# Patient Record
Sex: Female | Born: 1935
Health system: Southern US, Community
[De-identification: ages and names within clinical notes are randomized; demographics above are authoritative.]

## PROBLEM LIST (undated history)

## (undated) DIAGNOSIS — I639 Cerebral infarction, unspecified: Secondary | ICD-10-CM

## (undated) DIAGNOSIS — Q2112 Patent foramen ovale: Secondary | ICD-10-CM

## (undated) DIAGNOSIS — I10 Essential (primary) hypertension: Secondary | ICD-10-CM

## (undated) DIAGNOSIS — Q211 Atrial septal defect: Secondary | ICD-10-CM

## (undated) DIAGNOSIS — Z7901 Long term (current) use of anticoagulants: Secondary | ICD-10-CM

## (undated) DIAGNOSIS — F329 Major depressive disorder, single episode, unspecified: Secondary | ICD-10-CM

## (undated) DIAGNOSIS — I480 Paroxysmal atrial fibrillation: Secondary | ICD-10-CM

## (undated) DIAGNOSIS — E785 Hyperlipidemia, unspecified: Secondary | ICD-10-CM

## (undated) DIAGNOSIS — F32A Depression, unspecified: Secondary | ICD-10-CM

## (undated) HISTORY — DX: Depression, unspecified: F32.A

## (undated) HISTORY — PX: COLONOSCOPY: SHX174

## (undated) HISTORY — DX: Major depressive disorder, single episode, unspecified: F32.9

---

## 1998-09-28 ENCOUNTER — Ambulatory Visit (HOSPITAL_BASED_OUTPATIENT_CLINIC_OR_DEPARTMENT_OTHER): Admission: RE | Admit: 1998-09-28 | Discharge: 1998-09-28 | Payer: Self-pay | Admitting: Orthopedic Surgery

## 2000-07-21 ENCOUNTER — Ambulatory Visit (HOSPITAL_COMMUNITY): Admission: RE | Admit: 2000-07-21 | Discharge: 2000-07-21 | Payer: Self-pay | Admitting: Internal Medicine

## 2000-07-21 ENCOUNTER — Encounter: Payer: Self-pay | Admitting: Internal Medicine

## 2000-11-11 ENCOUNTER — Encounter: Admission: RE | Admit: 2000-11-11 | Discharge: 2001-02-09 | Payer: Self-pay | Admitting: Internal Medicine

## 2000-12-12 ENCOUNTER — Encounter: Payer: Self-pay | Admitting: Internal Medicine

## 2000-12-12 ENCOUNTER — Ambulatory Visit (HOSPITAL_COMMUNITY): Admission: RE | Admit: 2000-12-12 | Discharge: 2000-12-12 | Payer: Self-pay | Admitting: Internal Medicine

## 2001-04-22 ENCOUNTER — Encounter: Payer: Self-pay | Admitting: Internal Medicine

## 2001-04-22 ENCOUNTER — Ambulatory Visit (HOSPITAL_COMMUNITY): Admission: RE | Admit: 2001-04-22 | Discharge: 2001-04-22 | Payer: Self-pay | Admitting: Internal Medicine

## 2001-11-16 ENCOUNTER — Other Ambulatory Visit: Admission: RE | Admit: 2001-11-16 | Discharge: 2001-11-16 | Payer: Self-pay | Admitting: Dermatology

## 2001-12-14 ENCOUNTER — Encounter: Payer: Self-pay | Admitting: Internal Medicine

## 2001-12-14 ENCOUNTER — Ambulatory Visit (HOSPITAL_COMMUNITY): Admission: RE | Admit: 2001-12-14 | Discharge: 2001-12-14 | Payer: Self-pay | Admitting: Internal Medicine

## 2002-04-28 ENCOUNTER — Encounter: Payer: Self-pay | Admitting: Emergency Medicine

## 2002-04-28 ENCOUNTER — Emergency Department (HOSPITAL_COMMUNITY): Admission: EM | Admit: 2002-04-28 | Discharge: 2002-04-28 | Payer: Self-pay | Admitting: Emergency Medicine

## 2002-12-16 ENCOUNTER — Ambulatory Visit (HOSPITAL_COMMUNITY): Admission: RE | Admit: 2002-12-16 | Discharge: 2002-12-16 | Payer: Self-pay | Admitting: Internal Medicine

## 2002-12-16 ENCOUNTER — Encounter: Payer: Self-pay | Admitting: Internal Medicine

## 2003-05-23 ENCOUNTER — Ambulatory Visit (HOSPITAL_COMMUNITY): Admission: RE | Admit: 2003-05-23 | Discharge: 2003-05-23 | Payer: Self-pay | Admitting: Internal Medicine

## 2003-05-28 ENCOUNTER — Emergency Department (HOSPITAL_COMMUNITY): Admission: EM | Admit: 2003-05-28 | Discharge: 2003-05-28 | Payer: Self-pay | Admitting: Emergency Medicine

## 2003-06-03 ENCOUNTER — Ambulatory Visit (HOSPITAL_BASED_OUTPATIENT_CLINIC_OR_DEPARTMENT_OTHER): Admission: RE | Admit: 2003-06-03 | Discharge: 2003-06-03 | Payer: Self-pay | Admitting: Orthopedic Surgery

## 2003-06-03 ENCOUNTER — Encounter: Admission: RE | Admit: 2003-06-03 | Discharge: 2003-06-03 | Payer: Self-pay | Admitting: Orthopedic Surgery

## 2004-01-17 ENCOUNTER — Ambulatory Visit (HOSPITAL_COMMUNITY): Admission: RE | Admit: 2004-01-17 | Discharge: 2004-01-17 | Payer: Self-pay | Admitting: Internal Medicine

## 2005-01-21 ENCOUNTER — Ambulatory Visit (HOSPITAL_COMMUNITY): Admission: RE | Admit: 2005-01-21 | Discharge: 2005-01-21 | Payer: Self-pay | Admitting: Internal Medicine

## 2005-01-31 ENCOUNTER — Ambulatory Visit (HOSPITAL_COMMUNITY): Admission: RE | Admit: 2005-01-31 | Discharge: 2005-01-31 | Payer: Self-pay | Admitting: Internal Medicine

## 2005-02-28 ENCOUNTER — Ambulatory Visit (HOSPITAL_COMMUNITY): Admission: RE | Admit: 2005-02-28 | Discharge: 2005-02-28 | Payer: Self-pay | Admitting: Internal Medicine

## 2005-04-06 ENCOUNTER — Emergency Department (HOSPITAL_COMMUNITY): Admission: EM | Admit: 2005-04-06 | Discharge: 2005-04-06 | Payer: Self-pay | Admitting: Emergency Medicine

## 2005-04-18 ENCOUNTER — Ambulatory Visit (HOSPITAL_COMMUNITY): Admission: RE | Admit: 2005-04-18 | Discharge: 2005-04-18 | Payer: Self-pay | Admitting: Internal Medicine

## 2005-04-29 ENCOUNTER — Ambulatory Visit (HOSPITAL_COMMUNITY): Admission: RE | Admit: 2005-04-29 | Discharge: 2005-04-29 | Payer: Self-pay | Admitting: Internal Medicine

## 2006-02-03 ENCOUNTER — Ambulatory Visit (HOSPITAL_COMMUNITY): Admission: RE | Admit: 2006-02-03 | Discharge: 2006-02-03 | Payer: Self-pay | Admitting: Internal Medicine

## 2007-02-10 ENCOUNTER — Ambulatory Visit (HOSPITAL_COMMUNITY): Admission: RE | Admit: 2007-02-10 | Discharge: 2007-02-10 | Payer: Self-pay | Admitting: Internal Medicine

## 2008-02-12 ENCOUNTER — Ambulatory Visit (HOSPITAL_COMMUNITY): Admission: RE | Admit: 2008-02-12 | Discharge: 2008-02-12 | Payer: Self-pay | Admitting: Internal Medicine

## 2009-02-13 ENCOUNTER — Ambulatory Visit (HOSPITAL_COMMUNITY): Admission: RE | Admit: 2009-02-13 | Discharge: 2009-02-13 | Payer: Self-pay | Admitting: Internal Medicine

## 2009-02-22 ENCOUNTER — Ambulatory Visit (HOSPITAL_COMMUNITY): Admission: RE | Admit: 2009-02-22 | Discharge: 2009-02-22 | Payer: Self-pay | Admitting: Internal Medicine

## 2009-02-27 ENCOUNTER — Ambulatory Visit (HOSPITAL_COMMUNITY): Admission: RE | Admit: 2009-02-27 | Discharge: 2009-02-27 | Payer: Self-pay | Admitting: Internal Medicine

## 2009-03-03 ENCOUNTER — Ambulatory Visit: Payer: Self-pay | Admitting: Cardiology

## 2009-03-03 ENCOUNTER — Encounter (INDEPENDENT_AMBULATORY_CARE_PROVIDER_SITE_OTHER): Payer: Self-pay | Admitting: Internal Medicine

## 2009-10-18 ENCOUNTER — Observation Stay (HOSPITAL_COMMUNITY)
Admission: AD | Admit: 2009-10-18 | Discharge: 2009-10-19 | Payer: Self-pay | Source: Home / Self Care | Admitting: Internal Medicine

## 2010-02-15 ENCOUNTER — Ambulatory Visit (HOSPITAL_COMMUNITY)
Admission: RE | Admit: 2010-02-15 | Discharge: 2010-02-15 | Payer: Self-pay | Source: Home / Self Care | Attending: Internal Medicine | Admitting: Internal Medicine

## 2010-03-17 ENCOUNTER — Encounter: Payer: Self-pay | Admitting: Internal Medicine

## 2010-05-11 LAB — BASIC METABOLIC PANEL
BUN: 21 mg/dL (ref 6–23)
Chloride: 100 mEq/L (ref 96–112)
GFR calc non Af Amer: 49 mL/min — ABNORMAL LOW (ref >60)
Potassium: 4 mEq/L (ref 3.5–5.1)
Sodium: 136 mEq/L (ref 135–145)

## 2010-05-11 LAB — GLUCOSE, CAPILLARY
Glucose-Capillary: 139 mg/dL — ABNORMAL HIGH (ref 70–99)
Glucose-Capillary: 148 mg/dL — ABNORMAL HIGH (ref 70–99)
Glucose-Capillary: 184 mg/dL — ABNORMAL HIGH (ref 70–99)

## 2010-05-11 LAB — DIFFERENTIAL
Eosinophils Relative: 11 % — ABNORMAL HIGH (ref 0–5)
Lymphocytes Relative: 28 % (ref 12–46)
Lymphs Abs: 3 10*3/uL (ref 0.7–4.0)
Monocytes Absolute: 0.8 10*3/uL (ref 0.1–1.0)
Monocytes Relative: 8 % (ref 3–12)
Neutro Abs: 5.7 10*3/uL (ref 1.7–7.7)

## 2010-05-11 LAB — CBC
HCT: 43.1 % (ref 36.0–46.0)
Hemoglobin: 14.3 g/dL (ref 12.0–15.0)
MCV: 87.6 fL (ref 78.0–100.0)
RDW: 13.5 % (ref 11.5–15.5)
WBC: 10.8 10*3/uL — ABNORMAL HIGH (ref 4.0–10.5)

## 2010-07-13 NOTE — Op Note (Signed)
NAME:  Amy Wiggins, Amy Wiggins                       ACCOUNT NO.:  [id number]   MEDICAL RECORD NO.:  [id number]                   PATIENT TYPE:  AMB   LOCATION:  [place]                                  FACILITY:  [facility]   PHYSICIAN:  Youlanda Mighty. Luisa Dago., M.D.          DATE OF BIRTH:  27-May-1931   DATE OF PROCEDURE:  06/03/2003  DATE OF DISCHARGE:                                 OPERATIVE REPORT   PREOPERATIVE DIAGNOSIS:  Chronic liquified hematoma dorsal aspect of the  left hand, status post blunt trauma 2 weeks prior.   POSTOPERATIVE DIAGNOSIS:  Chronic liquified hematoma dorsal aspect of the  left hand, status post blunt trauma 2 weeks prior.   PROCEDURE:  1. Evacuation of hematoma subcutaneous region dorsal aspect left hand.  2. Examination of left hand under anesthesia with gentle joint manipulation.   SURGEON:  Youlanda Mighty. Sypher, M.D.   ASSISTANT:  Marily Lente. Dasnoit, P.A.C.   ANESTHESIA:  General by LMA.   SUPERVISING ANESTHESIOLOGIST:  Dr. Oletta Lamas.   INDICATIONS:  Amy Wiggins is a 77 year old woman who 2 weeks prior  sustained a blunt injury to the dorsal aspect of her left hand. She was only  taking aspirin and was not on any anticoagulants. She developed a massive  hematoma on the dorsal aspect of her left hand that extends from her wrist  extensor creases to her metacarpal phalangeal joints.  This became tense and  was elevated nearly 4 cm above the metacarpal level.  She was seen by Dr.  Willey Blade in [place] and noted to have a substantial hematoma.  He requested  an upper extremity orthopedic consult.   Clinical examination on the morning of June 03, 2003 demonstrated a  partially congealed, partially liquefactive, tense hematoma on the dorsal  aspect of the left hand.  She was unable to flex her MP joints more than 40  degrees and had moderate stiffness of her IP joints.  Given the massive  nature of this hematoma and the stiffness of her joints we recommended  proceeding with evacuation of the hematoma on an urgent basis.   She was NPO and was scheduled for surgery immediately this afternoon.  After  informed consent she was brought to the operating room, at this time.   DESCRIPTION OF PROCEDURE:  Amy Wiggins is brought to the operating  room and placed in the supine position on the operating room table.  Following the induction of general anesthesia the left arm was prepped with  Betadine solution and sterilely draped. A pneumatic tourniquet was applied  to the proximal __________.   The arm was exsanguinated by elevation and manual compression for 1 minute.  The arterial tourniquet was inflated to 220 mmHg.  We proceeded to commence  with a short incision over the apex of the hematoma.  A small metal, blunt  tip vascular sucker was then used, not unlike liposuction, to repetitively  break up the hematoma  and evacuate the congealed blood and liquefactive  hematoma contents. With gentle effort, the entire hematoma was evacuated.   The subcutaneous space was then lavaged thoroughly with sterile saline until  the effluent was completely clear.  The wound was then dressed open with  Adaptic, sterile gauze and a volumus  hand dressing maintaining the MP  joints in maximum flexion.  There was noted to be moderate stiffness of the  MP and IP joints, therefore, gentle manipulation recovered 90 degrees of  motion of the MP joints and 100 degrees of motion of the PIP joints.   The hand was immobilized with the MP joints in 90 degrees flexion and the  wrist in 45 degrees dorsiflexion.  There were no apparent complications.                                               Youlanda Mighty Luisa Dago., M.D.    RVS/MEDQ  D:  06/03/2003  T:  06/04/2003  Job:  [id number]

## 2010-12-12 ENCOUNTER — Inpatient Hospital Stay (HOSPITAL_COMMUNITY)
Admission: EM | Admit: 2010-12-12 | Discharge: 2010-12-14 | DRG: 066 | Disposition: A | Discharge status: Home / Self Care | Payer: Medicare Other | Attending: Internal Medicine | Admitting: Internal Medicine

## 2010-12-12 ENCOUNTER — Emergency Department (HOSPITAL_COMMUNITY): Payer: Medicare Other

## 2010-12-12 ENCOUNTER — Other Ambulatory Visit: Payer: Self-pay

## 2010-12-12 DIAGNOSIS — F329 Major depressive disorder, single episode, unspecified: Secondary | ICD-10-CM | POA: Diagnosis present

## 2010-12-12 DIAGNOSIS — K219 Gastro-esophageal reflux disease without esophagitis: Secondary | ICD-10-CM | POA: Diagnosis present

## 2010-12-12 DIAGNOSIS — I6350 Cerebral infarction due to unspecified occlusion or stenosis of unspecified cerebral artery: Principal | ICD-10-CM | POA: Diagnosis present

## 2010-12-12 DIAGNOSIS — I639 Cerebral infarction, unspecified: Secondary | ICD-10-CM

## 2010-12-12 DIAGNOSIS — R29898 Other symptoms and signs involving the musculoskeletal system: Secondary | ICD-10-CM | POA: Diagnosis present

## 2010-12-12 DIAGNOSIS — E785 Hyperlipidemia, unspecified: Secondary | ICD-10-CM | POA: Diagnosis present

## 2010-12-12 DIAGNOSIS — I10 Essential (primary) hypertension: Secondary | ICD-10-CM | POA: Diagnosis present

## 2010-12-12 DIAGNOSIS — E119 Type 2 diabetes mellitus without complications: Secondary | ICD-10-CM | POA: Diagnosis present

## 2010-12-12 DIAGNOSIS — F3289 Other specified depressive episodes: Secondary | ICD-10-CM | POA: Diagnosis present

## 2010-12-12 HISTORY — DX: Cerebral infarction, unspecified: I63.9

## 2010-12-12 HISTORY — DX: Essential (primary) hypertension: I10

## 2010-12-12 LAB — URINALYSIS, ROUTINE W REFLEX MICROSCOPIC
Bilirubin Urine: NEGATIVE
Glucose, UA: >1000 mg/dL — AB
Ketones, ur: NEGATIVE mg/dL
Leukocytes, UA: NEGATIVE
Protein, ur: NEGATIVE mg/dL
pH: 5.5 (ref 5.0–8.0)

## 2010-12-12 LAB — BASIC METABOLIC PANEL
BUN: 25 mg/dL — ABNORMAL HIGH (ref 6–23)
Chloride: 98 mEq/L (ref 96–112)
Creatinine, Ser: 1.18 mg/dL — ABNORMAL HIGH (ref 0.50–1.10)
GFR calc Af Amer: 51 mL/min — ABNORMAL LOW (ref >90)
GFR calc non Af Amer: 44 mL/min — ABNORMAL LOW (ref >90)
Potassium: 3.8 mEq/L (ref 3.5–5.1)

## 2010-12-12 LAB — CBC
HCT: 35.9 % — ABNORMAL LOW (ref 36.0–46.0)
MCHC: 32.9 g/dL (ref 30.0–36.0)
MCV: 87.8 fL (ref 78.0–100.0)
Platelets: 296 10*3/uL (ref 150–400)
RDW: 12.7 % (ref 11.5–15.5)
WBC: 7.4 10*3/uL (ref 4.0–10.5)

## 2010-12-12 LAB — CARDIAC PANEL(CRET KIN+CKTOT+MB+TROPI)
Relative Index: INVALID (ref 0.0–2.5)
Troponin I: <0.3 ng/mL (ref <0.30)

## 2010-12-12 LAB — GLUCOSE, CAPILLARY: Glucose-Capillary: 136 mg/dL — ABNORMAL HIGH (ref 70–99)

## 2010-12-12 LAB — URINE MICROSCOPIC-ADD ON

## 2010-12-12 LAB — PROTIME-INR: Prothrombin Time: 12 seconds (ref 11.6–15.2)

## 2010-12-12 MED ORDER — PANTOPRAZOLE SODIUM 40 MG PO TBEC
40.0000 mg | DELAYED_RELEASE_TABLET | Freq: Every day | ORAL | Status: DC
  Administered 2010-12-13: 40 mg via ORAL
  Filled 2010-12-12: qty 1

## 2010-12-12 MED ORDER — INSULIN ASPART 100 UNIT/ML ~~LOC~~ SOLN: 0.0000 [IU] | Freq: Every day | SUBCUTANEOUS | Status: DC

## 2010-12-12 MED ORDER — CITALOPRAM HYDROBROMIDE 10 MG PO TABS
10.0000 mg | ORAL_TABLET | Freq: Every day | ORAL | Status: DC
  Administered 2010-12-13: 10 mg via ORAL
  Filled 2010-12-12 (×3): qty 1

## 2010-12-12 MED ORDER — ROSUVASTATIN CALCIUM 5 MG PO TABS
5.0000 mg | ORAL_TABLET | Freq: Every day | ORAL | Status: DC
  Administered 2010-12-13: 5 mg via ORAL
  Filled 2010-12-12 (×3): qty 1

## 2010-12-12 MED ORDER — ONDANSETRON HCL 4 MG/2ML IJ SOLN: 4.0000 mg | Freq: Four times a day (QID) | INTRAMUSCULAR | Status: DC | PRN

## 2010-12-12 MED ORDER — LOSARTAN POTASSIUM 50 MG PO TABS
100.0000 mg | ORAL_TABLET | Freq: Every day | ORAL | Status: DC
  Administered 2010-12-12 – 2010-12-13 (×2): 100 mg via ORAL
  Filled 2010-12-12 (×3): qty 2

## 2010-12-12 MED ORDER — HYDROCHLOROTHIAZIDE 25 MG PO TABS
25.0000 mg | ORAL_TABLET | Freq: Every day | ORAL | Status: DC
Start: 2010-12-12 — End: 2010-12-14
  Administered 2010-12-13: 25 mg via ORAL
  Filled 2010-12-12 (×3): qty 1

## 2010-12-12 MED ORDER — ENOXAPARIN SODIUM 40 MG/0.4ML ~~LOC~~ SOLN
40.0000 mg | Freq: Every day | SUBCUTANEOUS | Status: DC
  Administered 2010-12-13: 40 mg via SUBCUTANEOUS
  Filled 2010-12-12: qty 0.4

## 2010-12-12 MED ORDER — ASPIRIN EC 81 MG PO TBEC
81.0000 mg | DELAYED_RELEASE_TABLET | Freq: Every day | ORAL | Status: DC
  Administered 2010-12-13: 81 mg via ORAL
  Filled 2010-12-12 (×3): qty 1

## 2010-12-12 MED ORDER — METFORMIN HCL 500 MG PO TABS
1000.0000 mg | ORAL_TABLET | Freq: Two times a day (BID) | ORAL | Status: DC
  Administered 2010-12-12 – 2010-12-14 (×4): 1000 mg via ORAL
  Filled 2010-12-12 (×4): qty 2

## 2010-12-12 MED ORDER — ONDANSETRON HCL 4 MG PO TABS: 4.0000 mg | ORAL_TABLET | Freq: Four times a day (QID) | ORAL | Status: DC | PRN

## 2010-12-12 MED ORDER — SODIUM CHLORIDE 0.9 % IJ SOLN
3.0000 mL | Freq: Two times a day (BID) | INTRAMUSCULAR | Status: DC
  Administered 2010-12-12 – 2010-12-13 (×3): 3 mL via INTRAVENOUS
  Filled 2010-12-12 (×2): qty 3

## 2010-12-12 MED ORDER — ACETAMINOPHEN 650 MG RE SUPP: 650.0000 mg | Freq: Four times a day (QID) | RECTAL | Status: DC | PRN

## 2010-12-12 MED ORDER — METFORMIN HCL 500 MG PO TABS: 2000.0000 mg | ORAL_TABLET | Freq: Two times a day (BID) | ORAL | Status: DC

## 2010-12-12 MED ORDER — ACETAMINOPHEN 325 MG PO TABS
650.0000 mg | ORAL_TABLET | Freq: Four times a day (QID) | ORAL | Status: DC | PRN
  Administered 2010-12-12 – 2010-12-13 (×3): 650 mg via ORAL
  Filled 2010-12-12 (×3): qty 2

## 2010-12-12 MED ORDER — ASPIRIN 81 MG PO CHEW
324.0000 mg | CHEWABLE_TABLET | Freq: Once | ORAL | Status: AC
  Administered 2010-12-12: 324 mg via ORAL
  Filled 2010-12-12: qty 4

## 2010-12-12 MED ORDER — INSULIN ASPART 100 UNIT/ML ~~LOC~~ SOLN
0.0000 [IU] | Freq: Three times a day (TID) | SUBCUTANEOUS | Status: DC
  Administered 2010-12-12: 2 [IU] via SUBCUTANEOUS
  Administered 2010-12-13 – 2010-12-14 (×3): 1 [IU] via SUBCUTANEOUS
  Filled 2010-12-12: qty 3

## 2010-12-12 MED ORDER — CLOPIDOGREL BISULFATE 75 MG PO TABS
75.0000 mg | ORAL_TABLET | Freq: Every day | ORAL | Status: DC
  Administered 2010-12-13: 75 mg via ORAL
  Filled 2010-12-12: qty 1

## 2010-12-12 MED ORDER — SODIUM CHLORIDE 0.9 % IV SOLN
Freq: Once | INTRAVENOUS | Status: AC
  Administered 2010-12-12: 10:00:00 via INTRAVENOUS

## 2010-12-12 MED ORDER — SODIUM CHLORIDE 0.9 % IV SOLN: 250.0000 mL | INTRAVENOUS | Status: DC

## 2010-12-12 MED ORDER — ZOLPIDEM TARTRATE 5 MG PO TABS
5.0000 mg | ORAL_TABLET | Freq: Every evening | ORAL | Status: DC | PRN
  Administered 2010-12-12 – 2010-12-13 (×2): 5 mg via ORAL
  Filled 2010-12-12 (×2): qty 1

## 2010-12-12 MED ORDER — ALUM & MAG HYDROXIDE-SIMETH 200-200-20 MG/5ML PO SUSP: 30.0000 mL | Freq: Four times a day (QID) | ORAL | Status: DC | PRN

## 2010-12-12 MED ORDER — SODIUM CHLORIDE 0.9 % IJ SOLN
3.0000 mL | INTRAMUSCULAR | Status: DC | PRN
  Filled 2010-12-12: qty 3

## 2010-12-12 NOTE — ED Notes (Signed)
Pt given diabetic lunch tray

## 2010-12-12 NOTE — Progress Notes (Signed)
Findings of MRI of head received from Radiologist. Dr. Dorris Fetch paged at [phone]. Returned page at [employer]. Dr. Dorris Fetch ordered neurology consult as well as q 4 hour neuro checks. Will continue to monitor. Sharyn Blitz, RN

## 2010-12-12 NOTE — ED Provider Notes (Signed)
Medical screening examination/treatment/procedure(s) were conducted as a shared visit with non-physician practitioner(s) and myself.  I personally evaluated the patient during the encounter  77 year old, female with a history of strokes in the past.  Complains of left arm, and hand weakness, since this morning.  She denies trauma she denies pain anywhere.  She denies visual changes, nausea, vomiting, headache.  On physical examination.  She is in no distress.  Her heart and lungs are normal.  She does have decreased grip strength and extension of her wrist in the left hand compared to the right.  We'll perform a CAT scan to look for a stroke and plan on admitting her for evaluation of TIA versus stroke.  The  Elmer Picker, MD 12/28/10 [phone]

## 2010-12-12 NOTE — ED Notes (Signed)
Pt reports woke up at 0800 this am with weakness in left arm.  Denies any other symptoms.

## 2010-12-12 NOTE — H&P (Signed)
NAME:  Amy Wiggins, Amy Wiggins             ACCOUNT NO.:  [id number]  MEDICAL RECORD NO.:  [id number]  LOCATION:  A316                          FACILITY:  [facility]  PHYSICIAN:  Paula Compton. Willey Blade, MD       DATE OF BIRTH:  02-Nov-1931  DATE OF ADMISSION:  12/12/2010 DATE OF DISCHARGE:  LH                             HISTORY & PHYSICAL   CHIEF COMPLAINT:  Left hand weakness.  HISTORY OF PRESENT ILLNESS:  This patient is a 77 year old white female who presented to the emergency room after awakening with weakness in her left hand this morning.  She denied any weakness in her left leg.  She did not experience facial weakness or slurring of speech.  She denied any visual disturbances.  She has a prior history of strokes.  She has hypertension, diabetes, and hyperlipidemia.  She does not smoke.  She takes Plavix daily.  She has no known history of atrial fib.  She was last hospitalized for a stroke 14 months ago.  She was initially evaluated in the emergency room where CT scan of the brain revealed no evidence of acute stroke.  She was seen by tele, Neurology, and was not deemed to be a candidate for thrombolytic therapy.  PAST MEDICAL HISTORY: 1. Strokes. 2. Hypertension. 3. Hyperlipidemia. 4. Diabetes. 5. GERD. 6. Depression. 7. Colon adenomas. 8. History of right breast biopsy. 9. Status post right carpal tunnel release.  MEDICATIONS: 1. Plavix 75 mg daily. 2. Celexa 10 mg daily. 3. Byetta 10 mcg b.i.d. 4. Metformin ER 2000 mg daily. 5. Micardis 80 mg daily. 6. Amlodipine 5 mg daily. 7. HCTZ 12.5 mg daily. 8. Lipitor 10 mg daily. 9. Xanax 0.5 mg 2 at bedtime. 10.Pantoprazole 40 mg daily.  ALLERGIES:  None.  FAMILY HISTORY:  Her father had a stroke and hypertension.  Her mother had diabetes and breast cancer, a sister had a stroke.  SOCIAL HISTORY:  She does not smoke, drink, or use drugs.  She lives alone.  She has been under great deal of stress recently regarding her son's health  status.  REVIEW OF SYSTEMS:  No loss of consciousness, chest pain, palpitations, vomiting, change in bowel habits, or difficulty voiding.  She has had a headache today.  She denies fever.  PHYSICAL EXAMINATION:  VITAL SIGNS:  Temperature 98.3, pulse 82 and regular, respirations 20, blood pressure 122/70, after initially being as high as 170/75, oxygen saturation 94%. GENERAL:  Alert and in no distress. HEENT:  Eyes, nose, and oropharynx are unremarkable.  The face is symmetric.  Speech is intact. NECK:  Reveals no JVD or carotid bruit. LUNGS:  Clear. HEART:  Regular with no murmurs. ABDOMEN:  Nontender with no hepatosplenomegaly. EXTREMITIES:  Revealed no clubbing or edema. NEUROLOGIC:  Reveals a weakened grip in the left hand.  There is no weakness in the lower extremities and there is no weakness in the right upper extremity. LYMPH NODES:  No cervical, supraclavicular enlargement. SKIN:  Warm and dry.  LABORATORY DATA:  Sodium 136, potassium 3.8, bicarb 26, BUN 25, creatinine 1.18, calcium 10.1, glucose 229.  White count 7.4, hemoglobin 11.8, platelets 296,000.  Urinalysis reveals greater than 1000 glucose, negative protein.  CT scan of the brain reveals no evidence of acute stroke.  EKG reveals normal sinus rhythm.  IMPRESSION/PLAN: 1. Right hemispheric stroke.  She has left hand weakness which is     slightly improved from earlier today by her report and MRI of the     brain will be obtained.  A carotid ultrasound will be obtained.     Aspirin will be added to Plavix.  Physical therapy will be     consulted. 2. Hypertension.  We will observe in a monitored setting.  Continue     amlodipine, Micardis, and HCTZ. 3. Hyperlipidemia.  Continue statin therapy.  Her cholesterol was 165     with an LDL of 72 and HDL of 70 on her last check. 4. Diabetes.  Continue oral agents and supplement with NovoLog as     needed.  Her last hemoglobin A1c was 6.9 in June 2012. 5. History of  depression.  Continue Celexa. 6. Gastroesophageal reflux disease.  Continue PPI therapy.     Paula Compton. Willey Blade, MD     ROF/MEDQ  D:  12/12/2010  T:  12/12/2010  Job:  [id number]

## 2010-12-12 NOTE — ED Notes (Signed)
Pt remains at MRI testing

## 2010-12-12 NOTE — ED Provider Notes (Signed)
History     CSN: [id number] Arrival date & time: 12/12/2010  9:08 AM   First MD Initiated Contact with Patient 12/12/10 0914      Chief Complaint  Patient presents with  . Extremity Weakness    (Consider location/radiation/quality/duration/timing/severity/associated sxs/prior treatment) Patient is a 77 y.o. female presenting with extremity weakness. The history is provided by the patient.  Extremity Weakness This is a recurrent problem. The current episode started today. Pertinent negatives include no abdominal pain, arthralgias, chest pain, coughing or neck pain. The symptoms are aggravated by nothing. She has tried nothing for the symptoms. The treatment provided no relief.    Past Medical History  Diagnosis Date  . Hypertension   . Diabetes mellitus   . Stroke     TIA's x 2    History reviewed. No pertinent past surgical history.  No family history on file.  History  Substance Use Topics  . Smoking status: Never Smoker   . Smokeless tobacco: Not on file  . Alcohol Use: No    OB History    Grav Para Term Preterm Abortions TAB SAB Ect Mult Living                  Review of Systems  Constitutional: Negative for activity change.       All ROS Neg except as noted in HPI  HENT: Negative for nosebleeds and neck pain.   Eyes: Negative for photophobia and discharge.  Respiratory: Negative for cough, shortness of breath and wheezing.   Cardiovascular: Negative for chest pain and palpitations.  Gastrointestinal: Negative for abdominal pain and blood in stool.  Genitourinary: Negative for dysuria, frequency and hematuria.  Musculoskeletal: Positive for extremity weakness. Negative for back pain and arthralgias.  Skin: Negative.   Neurological: Negative for dizziness, seizures and speech difficulty.  Psychiatric/Behavioral: Negative for hallucinations and confusion.    Allergies  Review of patient's allergies indicates no known allergies.  Home Medications  No  current outpatient prescriptions on file.  BP 157/82  Pulse 96  Resp 18  Ht 5\' 3"  (1.6 m)  Wt 174 lb (78.926 kg)  BMI 30.82 kg/m2  SpO2 95%  Physical Exam  Nursing note and vitals reviewed. Constitutional: She is oriented to person, place, and time. She appears well-developed and well-nourished.  Non-toxic appearance.  HENT:  Head: Normocephalic.  Right Ear: Tympanic membrane and external ear normal.  Left Ear: Tympanic membrane and external ear normal.  Eyes: EOM and lids are normal. Pupils are equal, round, and reactive to light.  Neck: Normal range of motion. Neck supple. Carotid bruit is not present.  Cardiovascular: Normal rate, regular rhythm, normal heart sounds, intact distal pulses and normal pulses.   Pulmonary/Chest: Breath sounds normal. No respiratory distress.  Abdominal: Soft. Bowel sounds are normal. There is no tenderness. There is no guarding.  Musculoskeletal: Normal range of motion.  Lymphadenopathy:       Head (right side): No submandibular adenopathy present.       Head (left side): No submandibular adenopathy present.    She has no cervical adenopathy.  Neurological: She is alert and oriented to person, place, and time. She has normal strength. No cranial nerve deficit or sensory deficit. She exhibits abnormal muscle tone. She displays no Babinski's sign on the right side. She displays no Babinski's sign on the left side.       FROM of the left shoulder. Good shoulder shrug tone. Pt can flex and extend the left arm against  resistance. Weakness noted with finger adduction and finger extension. No lower ext tone/sensory changes.  Skin: Skin is warm and dry.  Psychiatric: She has a normal mood and affect. Her speech is normal.    ED Course: Test results given to pt. Pt seen with me by Dr Edrick Kins. Case discussed with Dr Willey Blade. He request case be discussed with neurology. Case discussed with Dr Doy Mince. She request pt be interviewed by the Tele-Neurologist. [id number]  - Tele-Neurologist suggest pt would benefit from admission with MRI of the head. MRA of the neck, and physical therapy. He feels she is out of the TPA window. Dr Willey Blade made aware.  Procedures (including critical care time)   Labs Reviewed  CBC  BASIC METABOLIC PANEL  PROTIME-INR  APTT  URINALYSIS, ROUTINE W REFLEX MICROSCOPIC   No results found.   Dx: Stroke   MDM:  I have reviewed nursing notes, vital signs, and all appropriate lab and imaging results for this patient. After considering exam, lab findings. And TeleNeuro consult, it is the opinion that patient should be admitted for additional treatment.  Orders placed during the hospital encounter of 12/12/10  . ED EKG  . ED EKG  EKG: there are no previous tracings available for comparison,Sinus rhythm. PAC's noted..No STEMI.       Lenox Ahr, [place] 12/27/10 2118

## 2010-12-12 NOTE — ED Notes (Signed)
Neurologist telecom conference completed with neurologist from [place] [facility]. Suggestions regarding plan of care to be discussed with H. Mitzi Davenport,  EDP

## 2010-12-13 LAB — GLUCOSE, CAPILLARY
Glucose-Capillary: 140 mg/dL — ABNORMAL HIGH (ref 70–99)
Glucose-Capillary: 79 mg/dL (ref 70–99)
Glucose-Capillary: 123 mg/dL — ABNORMAL HIGH (ref 70–99)

## 2010-12-13 NOTE — Progress Notes (Signed)
Physical Therapy Evaluation Patient Details Name: Amy Wiggins MRN: [id number] DOB: Jun 26, 1931 Today's Date: 12/13/2010  Problem List: There is no problem list on file for this patient.   Past Medical History:  Past Medical History  Diagnosis Date  . Hypertension   . Diabetes mellitus   . Stroke     TIA's x 2   Past Surgical History: History reviewed. No pertinent past surgical history.  PT Assessment/Plan/Recommendation PT Assessment Clinical Impression Statement: pt eval shows only deficit to be that of L finger weakness in flexion and extension....recommend OT eval followed by outpatien OT rehab PT Recommendation/Assessment: Patent does not need any further PT services No Skilled PT: Patient at baseline level of functioning PT Goals     PT Evaluation Precautions/Restrictions  Precautions Required Braces or Orthoses: No Restrictions Weight Bearing Restrictions: No Prior Functioning  Home Living Lives With: Alone Type of Home: Apartment Home Layout: One level Home Access: Level entry Bathroom Shower/Tub: Chiropodist: Standard Home Adaptive Equipment: None Prior Function Level of Independence: Independent with basic ADLs;Independent with gait;Independent with homemaking with ambulation;Independent with transfers Driving: Yes Vocation: Retired Artist: Awake/alert Overall Cognitive Status: Appears within functional limits for tasks assessed Orientation Level: Oriented X4 Sensation/Coordination Sensation Light Touch: Appears Intact Proprioception: Appears Intact Extremity Assessment RUE Assessment RUE Assessment: Within Functional Limits LUE Assessment LUE Assessment: Exceptions to [facility] LUE Strength Gross Grasp: Impaired (decreased strength in finger flex/ ext to 4-/5) RLE Assessment RLE Assessment: Within Functional Limits LLE Assessment LLE Assessment: Within Functional Limits Mobility (including  Balance) Bed Mobility Bed Mobility:  (independent) Transfers Transfers:  (independent) Ambulation/Gait Ambulation/Gait: Yes Ambulation/Gait Assistance: 7: Independent Ambulation Distance (Feet): 100 Feet Assistive device: None Gait Pattern: Within Functional Limits Gait velocity: WNL Stairs: No Wheelchair Mobility Wheelchair Mobility: No  Posture/Postural Control Posture/Postural Control: No significant limitations Balance Balance Assessed: Yes Dynamic Standing Balance Dynamic Standing - Level of Assistance: 7: Independent Exercise    End of Session PT - End of Session Equipment Utilized During Treatment: Gait belt Activity Tolerance: Patient tolerated treatment well Patient left: in bed;with call bell in reach General Behavior During Session: [facility] for tasks performed Cognition: [facility] for tasks performed  Sable Feil 12/13/2010, 11:53 AM

## 2010-12-13 NOTE — Progress Notes (Signed)
NAME:  Amy Wiggins, Amy Wiggins             ACCOUNT NO.:  [id number]  MEDICAL RECORD NO.:  [id number]  LOCATION:  A316                          FACILITY:  [facility]  PHYSICIAN:  Paula Compton. Willey Blade, MD       DATE OF BIRTH:  07/23/1931  DATE OF PROCEDURE: DATE OF DISCHARGE:                                PROGRESS NOTE   Mrs.  Amy Wiggins has had improvement in her left hand strength since admission.  She is able to make a better grip now.  She has no other neurological complaints.  PHYSICAL EXAMINATION:  VITAL SIGNS:  Her blood pressure is normal at 103/66, temperature is 98, with a pulse of 79, respirations of 20, and oxygen saturations 96%. GENERAL:  She is comfortable and alert.  Face is symmetric.  Speech is intact.  She has residual weakness in the left grip, but otherwise no weakness on neuro exam. LUNGS:  Clear. HEART:  Regular with no murmurs.  IMPRESSION/PLAN: 1. Stroke.  Her MRI scan reveals a small 8-mm nonhemorrhagic infarct     in the posterior right frontal lobe.  Her carotid ultrasound     reveals no hemodynamically significant stenosis.  Start physical     therapy.  Continue aspirin and Plavix. 2. Hypertension.  Continue losartan and hydrochlorothiazide. 3. Hyperlipidemia.  Continue statin therapy. 4. Diabetes.  Her glucose this morning is 140.  Byetta is on hold.     Continue metformin.  Continue sliding scale NovoLog. 5. Depression.  Continue Celexa.     Paula Compton. Willey Blade, MD     ROF/MEDQ  D:  12/13/2010  T:  12/13/2010  Job:  [id number]

## 2010-12-13 NOTE — Consult Note (Signed)
Reason for Consult:CVA Referring Physician: NIDA  Amy Wiggins is an 77 y.o. female.  HPI:  This is a 77 year old white female who presents with the acute onset of weakness of the left hand. This is not the first event for the patient. But a year ago she presented to the hospital here with acute severe dysarthria lasting for about a day or so. MRI at that time also was obtained and showed a left posterior frontal infarct. The patient reports that previous to the event lasted she also had an event where she developed numbness and weakness of the right hand lasting for several minutes. Imaging at that time was unremarkable per the patient. She has been on Plavix therapy since the most recent event last year. The patient did have a workup for the most recent event and she has a small infarct involving the right posterior frontal area. It appears to be relatively similar to the same location on the previous infarct. She reports some improvement of the hands but she still has significant weakness.  Past Medical History  Diagnosis Date  . Hypertension   . Diabetes mellitus   . Stroke     TIA's x 2    History reviewed. No pertinent past surgical history.  History reviewed. No pertinent family history.  Social History:  reports that she has never smoked. She does not have any smokeless tobacco history on file. She reports that she does not drink alcohol or use illicit drugs.  Allergies: No Known Allergies  Medications:  Prior to Admission medications   Medication Sig Start Date End Date Taking? Authorizing Provider  atorvastatin (LIPITOR) 10 MG tablet Take 10 mg by mouth at bedtime.     Yes Historical Provider, MD  clopidogrel (PLAVIX) 75 MG tablet Take 75 mg by mouth daily.     Yes Historical Provider, MD  hydrochlorothiazide (HYDRODIURIL) 25 MG tablet Take 25 mg by mouth daily.     Yes Historical Provider, MD  losartan (COZAAR) 50 MG tablet Take 100 mg by mouth at bedtime.     Yes  Historical Provider, MD  metFORMIN (GLUCOPHAGE) 500 MG tablet Take 2,000 mg by mouth 2 (two) times daily with a meal. Extended release    Yes Historical Provider, MD  pantoprazole (PROTONIX) 40 MG tablet Take 40 mg by mouth daily.     Yes Historical Provider, MD     Scheduled Meds:   . sodium chloride   Intravenous Once  . aspirin  324 mg Oral Once  . aspirin EC  81 mg Oral Daily  . citalopram  10 mg Oral Daily  . clopidogrel  75 mg Oral Daily  . enoxaparin  40 mg Subcutaneous Daily  . hydrochlorothiazide  25 mg Oral Daily  . insulin aspart  0-5 Units Subcutaneous QHS  . insulin aspart  0-9 Units Subcutaneous TID WC  . losartan  100 mg Oral QHS  . metFORMIN  1,000 mg Oral BID WC  . pantoprazole  40 mg Oral Daily  . rosuvastatin  5 mg Oral Daily  . sodium chloride  3 mL Intravenous Q12H  . DISCONTD: metFORMIN  2,000 mg Oral BID WC   Continuous Infusions:   . sodium chloride     PRN Meds:.acetaminophen, acetaminophen, alum & mag hydroxide-simeth, ondansetron (ZOFRAN) IV, ondansetron, sodium chloride, zolpidem   Results for orders placed during the hospital encounter of 12/12/10 (from the past 48 hour(s))  CBC     Status: Abnormal   Collection Time  12/12/10  9:45 AM      Component Value Range Comment   WBC 7.4  4.0 - 10.5 (K/uL)    RBC 4.09  3.87 - 5.11 (MIL/uL)    Hemoglobin 11.8 (*) 12.0 - 15.0 (g/dL)    HCT 35.9 (*) 36.0 - 46.0 (%)    MCV 87.8  78.0 - 100.0 (fL)    MCH 28.9  26.0 - 34.0 (pg)    MCHC 32.9  30.0 - 36.0 (g/dL)    RDW 12.7  11.5 - 15.5 (%)    Platelets 296  150 - 400 (K/uL)   BASIC METABOLIC PANEL     Status: Abnormal   Collection Time   12/12/10  9:45 AM      Component Value Range Comment   Sodium 136  135 - 145 (mEq/L)    Potassium 3.8  3.5 - 5.1 (mEq/L)    Chloride 98  96 - 112 (mEq/L)    CO2 26  19 - 32 (mEq/L)    Glucose, Bld 229 (*) 70 - 99 (mg/dL)    BUN 25 (*) 6 - 23 (mg/dL)    Creatinine, Ser 1.18 (*) 0.50 - 1.10 (mg/dL)    Calcium 10.1   8.4 - 10.5 (mg/dL)    GFR calc non Af Amer 44 (*) >90 (mL/min)    GFR calc Af Amer 51 (*) >90 (mL/min)   PROTIME-INR     Status: Normal   Collection Time   12/12/10  9:45 AM      Component Value Range Comment   Prothrombin Time 12.0  11.6 - 15.2 (seconds)    INR 0.87  0.00 - 1.49    APTT     Status: Normal   Collection Time   12/12/10  9:45 AM      Component Value Range Comment   aPTT 26  24 - 37 (seconds)   CARDIAC PANEL(CRET KIN+CKTOT+MB+TROPI)     Status: Normal   Collection Time   12/12/10  9:47 AM      Component Value Range Comment   Total CK 48  7 - 177 (U/L)    CK, MB 2.3  0.3 - 4.0 (ng/mL)    Troponin I <0.30  <0.30 (ng/mL)    Relative Index RELATIVE INDEX IS INVALID  0.0 - 2.5    URINALYSIS, ROUTINE W REFLEX MICROSCOPIC     Status: Abnormal   Collection Time   12/12/10 11:02 AM      Component Value Range Comment   Color, Urine YELLOW  YELLOW     Appearance CLEAR  CLEAR     Specific Gravity, Urine 1.020  1.005 - 1.030     pH 5.5  5.0 - 8.0     Glucose, UA >1000 (*) NEGATIVE (mg/dL)    Hgb urine dipstick NEGATIVE  NEGATIVE     Bilirubin Urine NEGATIVE  NEGATIVE     Ketones, ur NEGATIVE  NEGATIVE (mg/dL)    Protein, ur NEGATIVE  NEGATIVE (mg/dL)    Urobilinogen, UA 0.2  0.0 - 1.0 (mg/dL)    Nitrite NEGATIVE  NEGATIVE     Leukocytes, UA NEGATIVE  NEGATIVE    URINE MICROSCOPIC-ADD ON     Status: Abnormal   Collection Time   12/12/10 11:02 AM      Component Value Range Comment   Squamous Epithelial / LPF FEW (*) RARE     WBC, UA 0-2  <3 (WBC/hpf)   GLUCOSE, CAPILLARY     Status: Abnormal   Collection  Time   12/12/10  4:34 PM      Component Value Range Comment   Glucose-Capillary 153 (*) 70 - 99 (mg/dL)    Comment 1 Documented in Chart      Comment 2 Notify RN     GLUCOSE, CAPILLARY     Status: Abnormal   Collection Time   12/12/10  8:38 PM      Component Value Range Comment   Glucose-Capillary 136 (*) 70 - 99 (mg/dL)   GLUCOSE, CAPILLARY     Status: Abnormal    Collection Time   12/13/10  7:21 AM      Component Value Range Comment   Glucose-Capillary 140 (*) 70 - 99 (mg/dL)    Comment 1 Documented in Chart      Comment 2 Notify RN       Ct Head Wo Contrast  12/12/2010  *RADIOLOGY REPORT*  Clinical Data: Left upper extremity weakness, left hand numbness, headache, history of previous "light" strokes  CT HEAD WITHOUT CONTRAST  Technique:  Contiguous axial images were obtained from the base of the skull through the vertex without contrast.  Comparison: None  Findings: Normal ventricular morphology. No midline shift or mass effect. Normal appearance of brain parenchyma. No intracranial hemorrhage, mass lesion or evidence of acute infarction. Visualized paranasal sinuses and mastoid air cells clear. Bones unremarkable.  IMPRESSION: No acute intracranial abnormalities.  Original Report Authenticated By: Burnetta Sabin, M.D.   Mr Brain Wo Contrast  12/12/2010  *RADIOLOGY REPORT*  Clinical Data: Stroke.  Left hand numbness and weakness.  MRI HEAD WITHOUT CONTRAST  Technique:  Multiplanar, multiecho pulse sequences of the brain and surrounding structures were obtained according to standard protocol without intravenous contrast.  Comparison: CT head without contrast 12/12/2010.  Findings: A focal area of restricted diffusion is present in the posterior right frontal lobe, measuring 8 mm.  Subtle T2 hyperintensity is associated.  No other focal areas of restricted effusion are evident.  There is no hemorrhage or mass lesion.  Flow is present in the major intracranial arteries.  Periventricular and subcortical white matter changes are slightly advanced for age. The ventricles are proportionate to the degree of atrophy.  No significant extra-axial fluid collection is present.  The patient is status post bilateral lens extractions.  The globes and orbits are otherwise within normal limits.  Mild mucosal thickening is noted in the sphenoid sinuses.  The paranasal sinuses  and mastoid air cells are otherwise clear.  IMPRESSION:  1.  8 mm non hemorrhagic infarct in the posterior right frontal lobe could certainly account for left hand symptoms. 2.  Mild atrophy and white matter disease is slightly advanced for age.  This likely reflects the sequelae of chronic microvascular ischemia.  These results were called by telephone on 12/12/2010  at  05:15 p.m. to  Janett Billow, the patient's nurse on 300 unit, who verbally acknowledged these results.  Original Report Authenticated By: Resa Miner. MATTERN, M.D.   US Carotid Duplex Bilateral  12/12/2010  *RADIOLOGY REPORT*  Clinical Data: History of stroke.  BILATERAL CAROTID DUPLEX ULTRASOUND  Technique: Pearline Cables scale imaging, color Doppler and duplex ultrasound was performed of bilateral carotid and vertebral arteries in the neck.  Comparison:  Carotid duplex 02/27/2009  Criteria:  Quantification of carotid stenosis is based on velocity parameters that correlate the residual internal carotid diameter with NASCET-based stenosis levels, using the diameter of the distal internal carotid lumen as the denominator for stenosis measurement.  The following velocity measurements were  obtained:                   PEAK SYSTOLIC/END DIASTOLIC RIGHT ICA:                        71cm/sec CCA:                        [id number] SYSTOLIC ICA/CCA RATIO:     1.3 DIASTOLIC ICA/CCA RATIO:    1.9 ECA:                        105cm/sec  LEFT ICA:                        85cm/sec CCA:                        [id number] SYSTOLIC ICA/CCA RATIO:     1.0 DIASTOLIC ICA/CCA RATIO:    1.2 ECA:                        87cm/sec  Findings:  RIGHT CAROTID ARTERY: Small amount of plaque in the mid right common carotid artery.  Small amount of plaque at the right carotid bulb.  RIGHT VERTEBRAL ARTERY:  Antegrade flow and normal waveform in the right vertebral artery.  LEFT CAROTID ARTERY: Small amount of plaque in the distal left common carotid artery and proximal left external carotid  artery. No significant carotid artery stenosis.  LEFT VERTEBRAL ARTERY:  Antegrade flow and normal waveform in the left vertebral artery.  IMPRESSION: Mild amount of atherosclerotic disease involving the carotid arteries.  No significant carotid artery stenosis.  Estimated degree of internal carotid artery stenosis is less than 50% bilaterally.  No significant change from the prior examination.  Original Report Authenticated By: Markus Daft, M.D.    Review of Systems  Constitutional: Negative.   HENT: Negative.   Eyes: Negative.   Respiratory: Negative.   Cardiovascular: Negative.   Gastrointestinal: Negative.   Genitourinary: Negative.   Musculoskeletal: Negative.   Skin: Negative.   Endo/Heme/Allergies: Negative.   Psychiatric/Behavioral: Negative.    Blood pressure 103/66, pulse 79, temperature 98 F (36.7 C), temperature source Oral, resp. rate 20, height 5\' 3"  (1.6 m), weight 80 kg (176 lb 5.9 oz), SpO2 96.00%. Physical Exam GENERAL: This is a pleasant obese lady in no acute distress.  HEENT: Supple. Atraumatic normocephalic.   ABDOMEN: soft  EXTREMITIES: No edema   BACK: Normal.  SKIN: Normal by inspection.    MENTAL STATUS: Alert and oriented. Speech, language and cognition are generally intact. Judgment and insight normal.   CRANIAL NERVES: Pupils are equal, round and reactive to light and accomodation; extra ocular movements are full, there is no significant nystagmus; visual fields are full; upper and lower facial muscles are normal in strength and symmetric, there is no flattening of the nasolabial folds; tongue is midline; uvula is midline; shoulder elevation is normal.  MOTOR: She does have a mild pronator drift left upper extremity. There is significant impairment of manual dexterity and fine finger movements of the left hand. There is mild weakness however on hand grip. Other muscle groups of the left upper extremity including deltoid and triceps are normal. The other  muscle groups involving the other extremities show normal tone, bulk and strength.  COORDINATION: Left finger to nose is normal,  right finger to nose is normal, No rest tremor; no intention tremor; no postural tremor; no bradykinesia.  REFLEXES: Deep tendon reflexes are symmetrical and normal. Babinski reflexes are flexor bilaterally.   SENSATION: Normal to light touch, temperature, and pinprick.  Brain MRI scan is reviewed in person and there is a small signal seen on diffusion imaging involving the posterior frontal lobe on the right side. Although a small it appears to involve the juxtacortical area and may in fact involve the cortex.  Assessment/Plan: 1. Small cortical infarct involving the right frontal lobe. Risk factors age, hypertension diabetes and previous strokes. Aspirin has been added to Plavix would think is appropriate. However, she only needs to be on 2 antiplatelet agents for 3 months. Subsequent to this, she should be placed on a single agent ( either one will suffice) to reduce risk of bleeding.  [place] 12/13/2010, 8:56 AM

## 2010-12-13 NOTE — Progress Notes (Deleted)
Occupational Therapy Evaluation Patient Details Name: Amy Wiggins MRN: [id number] DOB: November 18, 1931 Today's Date: 12/13/2010 300-315 OT Evaluation Problem List: There is no problem list on file for this patient.   Past Medical History:  Past Medical History  Diagnosis Date  . Hypertension   . Diabetes mellitus   . Stroke     TIA's x 2   Past Surgical History: History reviewed. No pertinent past surgical history.  OT Assessment/Plan/Recommendation OT Assessment Clinical Impression Statement: A:  Patient presents with decreased AROM and strength in her left digits, s/p CVA. OT Recommendation/Assessment: Patient will need skilled OT in the acute care venue OT Problem List: Decreased range of motion;Other (comment) (decreased strength and hand coordination) OT Goals Acute Rehab OT Goals OT Goal Formulation: With patient Miscellaneous OT Goals Miscellaneous OT Goal #1: Patient will increase left hand strength, ROM, and coordination to [facility] for increased ability to use left hand with all daily activities.  OT Evaluation Precautions/Restrictions  Precautions Required Braces or Orthoses: No Restrictions Weight Bearing Restrictions: No Prior Functioning Home Living Lives With: Alone Type of Home: Apartment Home Layout: One level Home Access: Level entry Bathroom Shower/Tub: Chiropodist: Standard Home Adaptive Equipment: None Prior Function Level of Independence: Independent with basic ADLs;Independent with homemaking with ambulation;Independent with gait;Independent with transfers Driving: Yes Vocation: Retired ADL ADL ADL Comments: I with all BADLs.  She has minimal weakness in her left hand digits, but is still able to complete her ADLs. Patient is right hand dominant. Vision/Perception  Vision - History Baseline Vision: No visual deficits Cognition Cognition Arousal/Alertness: Awake/alert Overall Cognitive Status: Appears within functional  limits for tasks assessed Orientation Level: Oriented X4 Sensation/Coordination Sensation Light Touch: Appears Intact Coordination Gross Motor Movements are Fluid and Coordinated: Yes Fine Motor Movements are Fluid and Coordinated:  (decreased digit abd, ext, in hand manipulation) Extremity Assessment LUE Strength Gross Grasp: Impaired (4+/5 in left compared to 5/5 in right) Mobility    Exercises Hand Exercises Digit Composite Abduction: AROM;10 reps Digit Composite Adduction: AROM;10 reps Digit Lifts: AROM;10 reps Hand Activities Pick Up, Palm, Put Down: 10 reps Other Exercises Other Exercises: issued HEP for fine motor coordination training, tputty, and tendon glides End of Session OT - End of Session Activity Tolerance: Patient tolerated treatment well Patient left: in chair General Behavior During Session: [facility] for tasks performed Cognition: [facility] for tasks performed   Vangie Bicker, OTR/L  12/13/2010, 4:03 PM

## 2010-12-13 NOTE — Progress Notes (Signed)
Occupational Therapy Evaluation Patient Details Name: Amy Wiggins MRN: [id number] DOB: [birth year] Today's Date: 12/13/2010 OT eval 300-315  Problem List: There is no problem list on file for this patient.   Past Medical History:  Past Medical History  Diagnosis Date  . Hypertension   . Diabetes mellitus   . Stroke     TIA's x 2   Past Surgical History: History reviewed. No pertinent past surgical history.  OT Assessment/Plan/Recommendation OT Assessment Clinical Impression Statement: A:  Patient presents with decreased AROM and strength in her left digits, s/p CVA. OT Recommendation/Assessment: Patient will need skilled OT in the acute care venue OT Problem List: Decreased range of motion;Other (comment) (decreased strength and hand coordination) OT Plan OT Frequency: Min 2X/week OT Treatment/Interventions: Self-care/ADL training;Therapeutic exercise;Neuromuscular education OT Recommendation Follow Up Recommendations: Outpatient OT Individuals Consulted Consulted and Agree with Results and Recommendations: Patient OT Goals Acute Rehab OT Goals OT Goal Formulation: With patient Miscellaneous OT Goals Miscellaneous OT Goal #1: Patient will increase left hand strength, ROM, and coordination to [facility] for increased ability to use left hand with all daily activities.  OT Evaluation Precautions/Restrictions  Precautions Required Braces or Orthoses: No Restrictions Weight Bearing Restrictions: No Prior Functioning Home Living Lives With: Alone Type of Home: Apartment Home Layout: One level Home Access: Level entry Bathroom Shower/Tub: Chiropodist: Standard Home Adaptive Equipment: None Prior Function Level of Independence: Independent with basic ADLs;Independent with homemaking with ambulation;Independent with gait;Independent with transfers Driving: Yes Vocation: Retired ADL ADL ADL Comments: I with all BADLs.  She has minimal weakness in  her left hand digits, but is still able to complete her ADLs. Patient is right hand dominant. Vision/Perception  Vision - History Baseline Vision: No visual deficits Cognition Cognition Arousal/Alertness: Awake/alert Overall Cognitive Status: Appears within functional limits for tasks assessed Orientation Level: Oriented X4 Sensation/Coordination Sensation Light Touch: Appears Intact Coordination Gross Motor Movements are Fluid and Coordinated: Yes Fine Motor Movements are Fluid and Coordinated:  (decreased digit abd, ext, in hand manipulation) Extremity Assessment LUE Strength Gross Grasp: Impaired (4+/5 in left compared to 5/5 in right) Mobility    Exercises Hand Exercises Digit Composite Abduction: AROM;10 reps Digit Composite Adduction: AROM;10 reps Digit Lifts: AROM;10 reps Hand Activities Pick Up, Palm, Put Down: 10 reps Other Exercises Other Exercises: issued HEP for fine motor coordination training, tputty, and tendon glides End of Session OT - End of Session Activity Tolerance: Patient tolerated treatment well Patient left: in chair General Behavior During Session: [facility] for tasks performed Cognition: [facility] for tasks performed   Arbutus Ped 12/13/2010, 4:06 PM

## 2010-12-14 LAB — GLUCOSE, CAPILLARY: Glucose-Capillary: 144 mg/dL — ABNORMAL HIGH (ref 70–99)

## 2010-12-14 MED ORDER — ASPIRIN 81 MG PO TBEC: 81.0000 mg | DELAYED_RELEASE_TABLET | Freq: Every day | ORAL | Status: AC

## 2010-12-14 MED ORDER — CITALOPRAM HYDROBROMIDE 10 MG PO TABS: 10.0000 mg | ORAL_TABLET | Freq: Every day | ORAL | Status: DC

## 2010-12-14 MED ORDER — ALPRAZOLAM 0.5 MG PO TABS: 1.0000 mg | ORAL_TABLET | Freq: Two times a day (BID) | ORAL | Status: DC

## 2010-12-14 NOTE — Discharge Summary (Signed)
NAME:  KAVON, WEAKS             ACCOUNT NO.:  [id number]  MEDICAL RECORD NO.:  [id number]  LOCATION:  A316                          FACILITY:  [facility]  PHYSICIAN:  Paula Compton. Willey Blade, MD       DATE OF BIRTH:  1932/01/18  DATE OF ADMISSION:  12/12/2010 DATE OF DISCHARGE:  LH                              DISCHARGE SUMMARY   DISCHARGE DIAGNOSES: 1. Stroke. 2. Hypertension. 3. Diabetes. 4. Hyperlipidemia. 5. Depression.  DISCHARGE MEDICATIONS: 1. Plavix 75 mg daily. 2. Aspirin 81 mg daily. 3. Lipitor 10 mg daily. 4. Cozaar 100 mg daily. 5. HCTZ 25 mg daily. 6. Metformin ER 2000 mg daily. 7. Byetta 10 mcg b.i.d. 8. Protonix 40 mg daily. 9. Xanax 0.5 mg b.i.d. p.r.n.  HOSPITAL COURSE:  This patient is a 77 year old female who presented to the emergency room with left hand weakness.  She felt the weakness upon awakening in the morning.  On arrival to the emergency room, her CT scan of the brain revealed no evidence of hemorrhage or acute stroke.  She had a tele Neurology consultation.  She is not felt to be a candidate for thrombolytic therapy.  She was hospitalized and treated with aspirin and Plavix.  She underwent an MRI of the brain, which revealed an 8-mm nonhemorrhagic infarct in the posterior right frontal lobe.  She remained in normal sinus rhythm.  Hypertension was treated with losartan and HCTZ.  Glucoses were monitored and sliding scale NovoLog was ordered as needed.  Her hand strength is significantly improved.  She was evaluated and treated with physical therapy.  She was improved and stable for discharge on the 19th.  She will be seen in followup in my office in 2 weeks.  She will continue aspirin and Plavix.  She will continue lipid- lowering therapy and antihypertensive therapy and will continue her carbohydrate-modified diet.     Paula Compton. Willey Blade, MD     ROF/MEDQ  D:  12/14/2010  T:  12/14/2010  Job:  [id number]

## 2010-12-28 NOTE — ED Provider Notes (Signed)
Medical screening examination/treatment/procedure(s) were performed by non-physician practitioner and as supervising physician I was immediately available for consultation/collaboration.  Elmer Picker, MD 12/28/10 0730

## 2011-01-10 ENCOUNTER — Other Ambulatory Visit (HOSPITAL_COMMUNITY): Payer: Self-pay | Admitting: Internal Medicine

## 2011-01-10 DIAGNOSIS — Z139 Encounter for screening, unspecified: Secondary | ICD-10-CM

## 2011-02-22 ENCOUNTER — Ambulatory Visit (HOSPITAL_COMMUNITY)
Admission: RE | Admit: 2011-02-22 | Discharge: 2011-02-22 | Disposition: A | Discharge status: Home / Self Care | Payer: Medicare Other | Source: Ambulatory Visit | Attending: Internal Medicine | Admitting: Internal Medicine

## 2011-02-22 DIAGNOSIS — Z139 Encounter for screening, unspecified: Secondary | ICD-10-CM

## 2011-02-22 DIAGNOSIS — Z1231 Encounter for screening mammogram for malignant neoplasm of breast: Secondary | ICD-10-CM | POA: Insufficient documentation

## 2011-09-17 ENCOUNTER — Other Ambulatory Visit (HOSPITAL_COMMUNITY): Payer: Self-pay | Admitting: Internal Medicine

## 2011-09-17 DIAGNOSIS — R7989 Other specified abnormal findings of blood chemistry: Secondary | ICD-10-CM

## 2011-09-19 ENCOUNTER — Ambulatory Visit (HOSPITAL_COMMUNITY)
Admission: RE | Admit: 2011-09-19 | Discharge: 2011-09-19 | Disposition: A | Discharge status: Home / Self Care | Payer: Medicare Other | Source: Ambulatory Visit | Attending: Internal Medicine | Admitting: Internal Medicine

## 2011-09-19 DIAGNOSIS — R799 Abnormal finding of blood chemistry, unspecified: Secondary | ICD-10-CM | POA: Insufficient documentation

## 2011-09-19 DIAGNOSIS — R7989 Other specified abnormal findings of blood chemistry: Secondary | ICD-10-CM

## 2011-09-19 DIAGNOSIS — Q619 Cystic kidney disease, unspecified: Secondary | ICD-10-CM | POA: Insufficient documentation

## 2012-03-27 ENCOUNTER — Other Ambulatory Visit (HOSPITAL_COMMUNITY): Payer: Self-pay | Admitting: Internal Medicine

## 2012-03-27 DIAGNOSIS — Z139 Encounter for screening, unspecified: Secondary | ICD-10-CM

## 2012-03-30 ENCOUNTER — Ambulatory Visit (HOSPITAL_COMMUNITY)
Admission: RE | Admit: 2012-03-30 | Discharge: 2012-03-30 | Disposition: A | Discharge status: Home / Self Care | Payer: Medicare Other | Source: Ambulatory Visit | Attending: Internal Medicine | Admitting: Internal Medicine

## 2012-03-30 DIAGNOSIS — Z1231 Encounter for screening mammogram for malignant neoplasm of breast: Secondary | ICD-10-CM | POA: Insufficient documentation

## 2012-03-30 DIAGNOSIS — Z139 Encounter for screening, unspecified: Secondary | ICD-10-CM

## 2012-05-08 ENCOUNTER — Other Ambulatory Visit: Payer: Self-pay | Admitting: Internal Medicine

## 2012-05-20 ENCOUNTER — Ambulatory Visit (HOSPITAL_COMMUNITY)
Admission: RE | Admit: 2012-05-20 | Discharge: 2012-05-20 | Disposition: A | Discharge status: Home / Self Care | Payer: Medicare Other | Source: Ambulatory Visit | Attending: Internal Medicine | Admitting: Internal Medicine

## 2012-05-20 DIAGNOSIS — R928 Other abnormal and inconclusive findings on diagnostic imaging of breast: Secondary | ICD-10-CM | POA: Insufficient documentation

## 2012-07-15 ENCOUNTER — Encounter (HOSPITAL_COMMUNITY): Payer: Self-pay

## 2012-07-15 ENCOUNTER — Emergency Department (HOSPITAL_COMMUNITY)
Admission: EM | Admit: 2012-07-15 | Discharge: 2012-07-16 | Disposition: A | Discharge status: Home / Self Care | Payer: Medicare Other | Attending: Emergency Medicine | Admitting: Emergency Medicine

## 2012-07-15 ENCOUNTER — Emergency Department (HOSPITAL_COMMUNITY): Payer: Medicare Other

## 2012-07-15 DIAGNOSIS — Y92009 Unspecified place in unspecified non-institutional (private) residence as the place of occurrence of the external cause: Secondary | ICD-10-CM | POA: Insufficient documentation

## 2012-07-15 DIAGNOSIS — T148XXA Other injury of unspecified body region, initial encounter: Secondary | ICD-10-CM

## 2012-07-15 DIAGNOSIS — W19XXXA Unspecified fall, initial encounter: Secondary | ICD-10-CM

## 2012-07-15 DIAGNOSIS — Z7902 Long term (current) use of antithrombotics/antiplatelets: Secondary | ICD-10-CM | POA: Insufficient documentation

## 2012-07-15 DIAGNOSIS — Z23 Encounter for immunization: Secondary | ICD-10-CM | POA: Insufficient documentation

## 2012-07-15 DIAGNOSIS — Z79899 Other long term (current) drug therapy: Secondary | ICD-10-CM | POA: Insufficient documentation

## 2012-07-15 DIAGNOSIS — T07XXXA Unspecified multiple injuries, initial encounter: Secondary | ICD-10-CM

## 2012-07-15 DIAGNOSIS — Y9389 Activity, other specified: Secondary | ICD-10-CM | POA: Insufficient documentation

## 2012-07-15 DIAGNOSIS — S0083XA Contusion of other part of head, initial encounter: Secondary | ICD-10-CM | POA: Insufficient documentation

## 2012-07-15 DIAGNOSIS — Z8673 Personal history of transient ischemic attack (TIA), and cerebral infarction without residual deficits: Secondary | ICD-10-CM | POA: Insufficient documentation

## 2012-07-15 DIAGNOSIS — S5010XA Contusion of unspecified forearm, initial encounter: Secondary | ICD-10-CM | POA: Insufficient documentation

## 2012-07-15 DIAGNOSIS — E119 Type 2 diabetes mellitus without complications: Secondary | ICD-10-CM | POA: Insufficient documentation

## 2012-07-15 DIAGNOSIS — I10 Essential (primary) hypertension: Secondary | ICD-10-CM | POA: Insufficient documentation

## 2012-07-15 DIAGNOSIS — S40022A Contusion of left upper arm, initial encounter: Secondary | ICD-10-CM

## 2012-07-15 DIAGNOSIS — W010XXA Fall on same level from slipping, tripping and stumbling without subsequent striking against object, initial encounter: Secondary | ICD-10-CM | POA: Insufficient documentation

## 2012-07-15 DIAGNOSIS — IMO0002 Reserved for concepts with insufficient information to code with codable children: Secondary | ICD-10-CM | POA: Insufficient documentation

## 2012-07-15 DIAGNOSIS — S0003XA Contusion of scalp, initial encounter: Secondary | ICD-10-CM | POA: Insufficient documentation

## 2012-07-15 MED ORDER — BACITRACIN ZINC 500 UNIT/GM EX OINT
TOPICAL_OINTMENT | CUTANEOUS | Status: AC
  Filled 2012-07-15: qty 2.7

## 2012-07-15 NOTE — ED Notes (Signed)
Pt fell bringing in groceries around 6pm. Unable to get up off ground, was able to scoot around some. Was found @ 9:30 by family. Pt denies LOC. C/o pain to low back. Has skin tear to left elbow and left knee laceration.

## 2012-07-15 NOTE — ED Provider Notes (Signed)
History    Scribed for [employer]. Olin Hauser, MD, the patient was seen in room APA01/APA01. This chart was scribed by Denice Bors, ED scribe. Patient's care was started at 2300  CSN: [id number]  Arrival date & time 07/15/12  2207   First MD Initiated Contact with Patient 07/15/12 2257      Chief Complaint  Patient presents with  . Fall    (Consider location/radiation/quality/duration/timing/severity/associated sxs/prior treatment) The history is provided by the patient and a relative.   HPI Comments: Amy Wiggins is a 79 y.o. female who presents to the Emergency Department complaining of fall onset 6 pm this evening after tripping while going up steps in front of house. Reports moderate constant low back pain. Denies LOC, headache, and other associated symptoms. Denies any aggravating and alleviating factors. Denies taking any medications for pain to relieve symptoms PTA. Reports found by family 9:30pm. Reports taking Plavix. Has a skin tear to her left elbow and marked swelling and bruising to the left arm. She has an abrasion to the left knee.   PCP [name]   Past Medical History  Diagnosis Date  . Hypertension   . Diabetes mellitus   . Stroke     TIA's x 2    History reviewed. No pertinent past surgical history.  No family history on file.  History  Substance Use Topics  . Smoking status: Never Smoker   . Smokeless tobacco: Not on file  . Alcohol Use: No    OB History   Grav Para Term Preterm Abortions TAB SAB Ect Mult Living                  Review of Systems  Constitutional: Negative for fever.       10 Systems reviewed and are negative for acute change except as noted in the HPI.  HENT: Negative for congestion.   Eyes: Negative for discharge and redness.  Respiratory: Negative for cough and shortness of breath.   Cardiovascular: Negative for chest pain.  Gastrointestinal: Negative for vomiting and abdominal pain.  Musculoskeletal: Negative for back  pain.  Skin: Positive for wound. Negative for rash.       Skin tear to left elbow, abrasion and skin tear to left knee Swelling and bruising to left arm  Neurological: Negative for syncope, numbness and headaches.  Psychiatric/Behavioral:       No behavior change.  All other systems reviewed and are negative.   A complete 10 system review of systems was obtained and all systems are negative except as noted in the HPI and PMH.    Allergies  Review of patient's allergies indicates no known allergies.  Home Medications   Current Outpatient Rx  Name  Route  Sig  Dispense  Refill  . ALPRAZolam (XANAX) 0.5 MG tablet   Oral   Take 0.5 mg by mouth at bedtime as needed for sleep.         Marland Kitchen atorvastatin (LIPITOR) 10 MG tablet   Oral   Take 10 mg by mouth at bedtime.           . citalopram (CELEXA) 10 MG tablet   Oral   Take 10 mg by mouth daily.         . clopidogrel (PLAVIX) 75 MG tablet   Oral   Take 75 mg by mouth daily.           . hydrochlorothiazide (HYDRODIURIL) 25 MG tablet   Oral   Take 25  mg by mouth daily.           Marland Kitchen losartan (COZAAR) 50 MG tablet   Oral   Take 50 mg by mouth at bedtime.          . pantoprazole (PROTONIX) 40 MG tablet   Oral   Take 40 mg by mouth daily.             BP 180/98  Pulse 99  Temp(Src) 98.8 F (37.1 C) (Oral)  Resp 16  Ht 5\' 3"  (1.6 m)  Wt 160 lb (72.576 kg)  BMI 28.35 kg/m2  SpO2 97%  Physical Exam  Nursing note and vitals reviewed. Constitutional: She is oriented to person, place, and time. She appears well-developed and well-nourished. No distress.  Awake, alert, nontoxic appearance.  HENT:  Head: Normocephalic and atraumatic.    Bruising to left cheek  Eyes: EOM are normal. Pupils are equal, round, and reactive to light.  Neck: Normal range of motion. Neck supple. No tracheal deviation present.  Cardiovascular: Normal rate, regular rhythm and intact distal pulses.   Pulmonary/Chest: Effort normal and  breath sounds normal. No respiratory distress. She exhibits no tenderness.  Abdominal: Soft. Bowel sounds are normal. There is no tenderness. There is no rebound.  Musculoskeletal: Normal range of motion. She exhibits no tenderness.  Baseline ROM, no obvious new focal weakness.FROM to left knee.   Neurological: She is alert and oriented to person, place, and time.  Mental status and motor strength appears baseline for patient and situation.  Skin: Skin is warm and dry. No rash noted.  Skin tear to left elbow 2 by 3 cm L-shaped. Left forearm hematoma and extensive bruising. Abrasion and laceration to left knee noted.  Psychiatric: She has a normal mood and affect. Her behavior is normal.    ED Course  Procedures (including critical care time) Results for orders placed during the hospital encounter of 07/15/12  GLUCOSE, CAPILLARY      Result Value Range   Glucose-Capillary 189 (*) 70 - 99 mg/dL   2310 Pt informed and agrees with plan  Medications  bacitracin 500 UNIT/GM ointment (not administered)  HYDROcodone-acetaminophen (NORCO/VICODIN) 5-325 MG per tablet 1 tablet (not administered)  TDaP (BOOSTRIX) injection 0.5 mL (0.5 mLs Intramuscular Given 07/16/12 0032)    Dg Elbow Complete Left  07/16/2012   *RADIOLOGY REPORT*  Clinical Data: Fall.  Elbow injury and pain.  LEFT ELBOW - COMPLETE 3+ VIEW  Comparison:  None.  Findings:  There is no evidence of fracture, dislocation, or joint effusion.  There is no evidence of arthropathy or other focal bone abnormality.  Soft tissues are unremarkable.  IMPRESSION: Negative.   Original Report Authenticated By: Earle Gell, M.D.   Dg Forearm Left  07/16/2012   *RADIOLOGY REPORT*  Clinical Data: Fall.  Left forearm pain, bruising, and swelling.  LEFT FOREARM - 2 VIEW  Comparison: None.  Findings: No evidence of fracture or dislocation.  Mild soft tissue swelling the proximal forearm is seen.  No evidence of soft tissue gas or radiopaque foreign body.   IMPRESSION: Proximal forearm soft tissue swelling.  No evidence of fracture.   Original Report Authenticated By: Earle Gell, M.D.   Dg Knee Complete 4 Views Left  07/16/2012   *RADIOLOGY REPORT*  Clinical Data: Fall.  Left knee pain and swelling.  LEFT KNEE - COMPLETE 4+ VIEW  Comparison: None.  Findings: Prepatellar soft tissue swelling is noted.  No evidence of fracture or dislocation.  No evidence of  knee joint effusion. Mild medial compartment osteoarthritis noted.  No other significant bone abnormality identified.  IMPRESSION:  1.  Prepatellar soft tissue swelling.  No evidence of fracture. 2.  Mild medial compartment osteoarthritis.   Original Report Authenticated By: Earle Gell, M.D.   Ct Maxillofacial Wo Cm  07/16/2012   *RADIOLOGY REPORT*  Clinical Data: [place].  Left facial pain and bruising.  CT MAXILLOFACIAL WITHOUT CONTRAST  Technique:  Multidetector CT imaging of the maxillofacial structures was performed. Multiplanar CT image reconstructions were also generated.  Comparison: Head CT 12/12/2010.  Findings: No acute facial bone fractures are identified.  The nasal bones are intact.  The walls of the maxillary sinus are intact.  No orbit or zygoma fractures.  Stable changes of hyperostosis frontalis interna but no frontal skull fracture.  The paranasal sinuses and mastoid air cells are clear.  There is significant deviation of the bony nasal septum. The globes are intact.  IMPRESSION: No acute facial bone fractures.   Original Report Authenticated By: Marijo Sanes, M.D.     MDM  Patient fell going into her home and was down on the ground for three hours. Sustained marked bruising and a hematoma to the left arm, skin tears to the left elbow and left knee. Xrays of the elbow, arm, knee are negative. CT of maxillofacial is negative. Given hydrocodone, skin lacerations dressed. Pt stable in ED with no significant deterioration in condition.The patient appears reasonably screened and/or stabilized  for discharge and I doubt any other medical condition or other [facility] requiring further screening, evaluation, or treatment in the ED at this time prior to discharge.  I personally performed the services described in this documentation, which was scribed in my presence. The recorded information has been reviewed and considered.   MDM Reviewed: nursing note and vitals Interpretation: x-ray           Gypsy Balsam. Olin Hauser, MD 07/16/12 0040

## 2012-07-15 NOTE — ED Notes (Signed)
Pt fell in her backyard, fell onto her left elbow and injured left knee.

## 2012-07-16 MED ORDER — LIDOCAINE-EPINEPHRINE-TETRACAINE (LET) SOLUTION
NASAL | Status: AC
  Administered 2012-07-16: 01:00:00 via TOPICAL
  Filled 2012-07-16: qty 3

## 2012-07-16 MED ORDER — TETANUS-DIPHTH-ACELL PERTUSSIS 5-2.5-18.5 LF-MCG/0.5 IM SUSP
0.5000 mL | Freq: Once | INTRAMUSCULAR | Status: AC
  Administered 2012-07-16: 0.5 mL via INTRAMUSCULAR
  Filled 2012-07-16 (×2): qty 0.5

## 2012-07-16 MED ORDER — HYDROCODONE-ACETAMINOPHEN 5-325 MG PO TABS
1.0000 | ORAL_TABLET | Freq: Once | ORAL | Status: AC
  Administered 2012-07-16: 1 via ORAL
  Filled 2012-07-16: qty 1

## 2012-07-16 MED ORDER — HYDROCODONE-ACETAMINOPHEN 5-325 MG PO TABS: 1.0000 | ORAL_TABLET | ORAL | Status: DC | PRN

## 2012-07-16 NOTE — ED Notes (Signed)
Attempted trial ambulation. Pt able to weight bear on legs but feels like she's leaning /falling backwards. Also feels nauseated, asking for juice. Felt better after sitting in chair, drinking apple juice and eating a few crackers.

## 2012-08-15 ENCOUNTER — Emergency Department (HOSPITAL_COMMUNITY): Payer: Medicare Other

## 2012-08-15 ENCOUNTER — Encounter (HOSPITAL_COMMUNITY): Payer: Self-pay | Admitting: *Deleted

## 2012-08-15 ENCOUNTER — Emergency Department (HOSPITAL_COMMUNITY)
Admission: EM | Admit: 2012-08-15 | Discharge: 2012-08-15 | Disposition: A | Discharge status: Home / Self Care | Payer: Medicare Other | Attending: Emergency Medicine | Admitting: Emergency Medicine

## 2012-08-15 DIAGNOSIS — Y93E1 Activity, personal bathing and showering: Secondary | ICD-10-CM | POA: Insufficient documentation

## 2012-08-15 DIAGNOSIS — Z79899 Other long term (current) drug therapy: Secondary | ICD-10-CM | POA: Insufficient documentation

## 2012-08-15 DIAGNOSIS — W1809XA Striking against other object with subsequent fall, initial encounter: Secondary | ICD-10-CM | POA: Insufficient documentation

## 2012-08-15 DIAGNOSIS — S0990XA Unspecified injury of head, initial encounter: Secondary | ICD-10-CM | POA: Insufficient documentation

## 2012-08-15 DIAGNOSIS — W010XXA Fall on same level from slipping, tripping and stumbling without subsequent striking against object, initial encounter: Secondary | ICD-10-CM | POA: Insufficient documentation

## 2012-08-15 DIAGNOSIS — Y92009 Unspecified place in unspecified non-institutional (private) residence as the place of occurrence of the external cause: Secondary | ICD-10-CM | POA: Insufficient documentation

## 2012-08-15 DIAGNOSIS — S42033A Displaced fracture of lateral end of unspecified clavicle, initial encounter for closed fracture: Secondary | ICD-10-CM | POA: Insufficient documentation

## 2012-08-15 DIAGNOSIS — I10 Essential (primary) hypertension: Secondary | ICD-10-CM | POA: Insufficient documentation

## 2012-08-15 DIAGNOSIS — Z7902 Long term (current) use of antithrombotics/antiplatelets: Secondary | ICD-10-CM | POA: Insufficient documentation

## 2012-08-15 DIAGNOSIS — S42002D Fracture of unspecified part of left clavicle, subsequent encounter for fracture with routine healing: Secondary | ICD-10-CM

## 2012-08-15 DIAGNOSIS — E119 Type 2 diabetes mellitus without complications: Secondary | ICD-10-CM | POA: Insufficient documentation

## 2012-08-15 DIAGNOSIS — Z8673 Personal history of transient ischemic attack (TIA), and cerebral infarction without residual deficits: Secondary | ICD-10-CM | POA: Insufficient documentation

## 2012-08-15 MED ORDER — HYDROCODONE-ACETAMINOPHEN 5-325 MG PO TABS: 1.0000 | ORAL_TABLET | ORAL | Status: DC | PRN

## 2012-08-15 MED ORDER — OXYCODONE-ACETAMINOPHEN 5-325 MG PO TABS
1.0000 | ORAL_TABLET | Freq: Once | ORAL | Status: AC
  Administered 2012-08-15: 1 via ORAL
  Filled 2012-08-15: qty 1

## 2012-08-15 NOTE — ED Notes (Signed)
Injury to left shoulder after falling getting out of shower.

## 2012-08-15 NOTE — ED Provider Notes (Signed)
History     CSN: [id number]  Arrival date & time 08/15/12  1204   First MD Initiated Contact with Patient 08/15/12 1254      Chief Complaint  Patient presents with  . Shoulder Pain     HPI Patient reports slipping and falling in her shower today and falling outside of the tabs are in the right side of her head on the sink and injuring her left shoulder.  She presents with moderate to severe left shoulder pain.  This seems to be localized to the left clavicle.  She denies weakness or numbness in her upper extremities.  No neck pain.  No loss consciousness.  She is on Plavix.  No chest pain shortness of breath.  No abdominal pain.  The patient is ambulatory.  She denies hip pain.  Her pain is worsened by movement and palpation of her left shoulder left clavicle.  Nothing improves her pain   Past Medical History  Diagnosis Date  . Hypertension   . Diabetes mellitus   . Stroke     TIA's x 2    History reviewed. No pertinent past surgical history.  No family history on file.  History  Substance Use Topics  . Smoking status: Never Smoker   . Smokeless tobacco: Not on file  . Alcohol Use: No    OB History   Grav Para Term Preterm Abortions TAB SAB Ect Mult Living                  Review of Systems  All other systems reviewed and are negative.    Allergies  Review of patient's allergies indicates no known allergies.  Home Medications   Current Outpatient Rx  Name  Route  Sig  Dispense  Refill  . ALPRAZolam (XANAX) 0.5 MG tablet   Oral   Take 0.5 mg by mouth at bedtime as needed for sleep.         Marland Kitchen atorvastatin (LIPITOR) 10 MG tablet   Oral   Take 10 mg by mouth at bedtime.           . citalopram (CELEXA) 10 MG tablet   Oral   Take 10 mg by mouth daily.         . clopidogrel (PLAVIX) 75 MG tablet   Oral   Take 75 mg by mouth daily.           Marland Kitchen glimepiride (AMARYL) 1 MG tablet   Oral   Take 1 mg by mouth daily before breakfast.         .  hydrochlorothiazide (HYDRODIURIL) 25 MG tablet   Oral   Take 25 mg by mouth daily.           Marland Kitchen losartan (COZAAR) 50 MG tablet   Oral   Take 50 mg by mouth at bedtime.          . pantoprazole (PROTONIX) 40 MG tablet   Oral   Take 40 mg by mouth daily.           Marland Kitchen HYDROcodone-acetaminophen (NORCO/VICODIN) 5-325 MG per tablet   Oral   Take 1 tablet by mouth every 4 (four) hours as needed for pain.   25 tablet   0     BP 145/71  Pulse 95  Temp(Src) 98.7 F (37.1 C) (Oral)  Resp 16  Ht 5\' 4"  (1.626 m)  Wt 170 lb (77.111 kg)  BMI 29.17 kg/m2  SpO2 96%  Physical Exam  Nursing note  and vitals reviewed. Constitutional: She is oriented to person, place, and time. She appears well-developed and well-nourished. No distress.  HENT:  Head: Normocephalic and atraumatic.  Small tenderness to her right parietal scalp without significant hematoma.  Eyes: EOM are normal.  Neck: Normal range of motion.  C-spine nontender.  C-spine cleared by Nexus criteria  Cardiovascular: Normal rate, regular rhythm and normal heart sounds.   Pulmonary/Chest: Effort normal and breath sounds normal.  Abdominal: Soft. She exhibits no distension. There is no tenderness.  Musculoskeletal: Normal range of motion.  Tenderness of her left distal clavicle.  Full range of motion of left shoulder.  Normal left radial pulse.  Neurological: She is alert and oriented to person, place, and time.  Skin: Skin is warm and dry.  Psychiatric: She has a normal mood and affect. Judgment normal.    ED Course  Procedures (including critical care time)  Labs Reviewed - No data to display Ct Head Wo Contrast  08/15/2012   *RADIOLOGY REPORT*  Clinical Data: Fall, head trauma, the patient on anticoagulation.  CT HEAD WITHOUT CONTRAST  Technique:  Contiguous axial images were obtained from the base of the skull through the vertex without contrast.  Comparison: Head CT 12/12/2010  Findings: No acute intracranial  hemorrhage.  No focal mass lesion. No CT evidence of acute infarction.   No midline shift or mass effect.  No hydrocephalus.  Basilar cisterns are patent. Paranasal sinuses and mastoid air cells are clear.  Orbits are normal.  IMPRESSION: No acute intracranial findings.  No change from prior.   Original Report Authenticated By: Suzy Bouchard, M.D.   Dg Shoulder Left  08/15/2012   *RADIOLOGY REPORT*  Clinical Data: Fall with left shoulder pain.  LEFT SHOULDER - 2+ VIEW  Comparison: None.  Findings: Mildly displaced fracture is seen involving the distal clavicle with slight elevation of the distal clavicle relative to the rest of the shaft of the clavicle.  No overt AC joint separation or dislocation is identified.  The glenohumeral joint shows normal alignment.  Soft tissues are unremarkable.  IMPRESSION: Mildly displaced distal left clavicular fracture.   Original Report Authenticated By: Aletta Edouard, M.D.   I personally reviewed the imaging tests through PACS system I reviewed available ER/hospitalization records through the EMR    1. Closed fracture of left clavicle with routine healing       MDM  CT head as the patient is on Plavix with close head injury.  CT head is negative.  Distal left clavicle fracture.  Symptomatic treatment with shoulder immobilizer and orthopedic followup as well as when necessary Vicodin at home.  She understands return to ER for new or worsening symptoms.  Mechanical fall.        Hoy Morn, MD 08/15/12 [phone]

## 2012-08-18 ENCOUNTER — Ambulatory Visit (INDEPENDENT_AMBULATORY_CARE_PROVIDER_SITE_OTHER): Payer: Medicare Other | Admitting: Orthopedic Surgery

## 2012-08-18 ENCOUNTER — Encounter: Payer: Self-pay | Admitting: Orthopedic Surgery

## 2012-08-18 VITALS — BP 150/90 | Ht 64.0 in | Wt 174.0 lb

## 2012-08-18 DIAGNOSIS — S42033A Displaced fracture of lateral end of unspecified clavicle, initial encounter for closed fracture: Secondary | ICD-10-CM

## 2012-08-18 DIAGNOSIS — S42032A Displaced fracture of lateral end of left clavicle, initial encounter for closed fracture: Secondary | ICD-10-CM

## 2012-08-18 MED ORDER — HYDROCODONE-ACETAMINOPHEN 5-325 MG PO TABS: 1.0000 | ORAL_TABLET | ORAL | Status: DC | PRN

## 2012-08-18 NOTE — Progress Notes (Signed)
Patient ID: Amy Wiggins, female   DOB: 1932-02-07, 79 y.o.   MRN: [id number] Chief Complaint  Patient presents with  . Shoulder Pain    Er follow up broken left shoulder d/t injury 08/15/12    Date of injury 08/15/2012 [place] out of the bathtub Emergency room visit June 21 x-ray show distal clavicle fracture minimal displacement pain is a 10, throbbing Constant Associated with bruising and swelling of the left shoulder, patient is on blood thinner  Review of systems positive fatigue, blurred vision, dizziness, negative findings for chest pain shortness of breath heartburn frequency skin changes nervousness excessive thirst adverse food reaction  BP 150/90  Ht 5\' 4"  (1.626 m)  Wt 174 lb (78.926 kg)  BMI 29.85 kg/m2  Allergies none Past Medical History  Diagnosis Date  . Hypertension   . Diabetes mellitus   . Stroke     TIA's x 2   No past surgical history on file.  General appearance is normal, the patient is alert and oriented x3 with normal mood and affect. BP 150/90  Ht 5\' 4"  (1.626 m)  Wt 174 lb (78.926 kg)  BMI 29.85 kg/m2 The patient is ambulatory with no limp or unsteadiness in her gait. Inspection of the left shoulder reveals significant bruising and swelling she also has a subcutaneous or, on her left forearm from a fall 2 weeks ago. She has essentially no active range of motion in the left shoulder normal and the elbow wrist and hand. Shoulder stability tests deferred elbow wrist and hand stable. Muscle tone normal her strength normal bilaterally skin ecchymosis noted multiple bruises noted over the left shoulder upper back area left forearm. Pulses normal radial and ulnar. Normal sensation left hand wrist  X-rays show a distal clavicle fracture the ligaments appear to be intact medially.  Recommend sling-and-swathe, continue Norco.  Remove swallowed and sling for bathing  X-ray clavicle 2 views in 3 weeks

## 2012-08-18 NOTE — Patient Instructions (Signed)
Wear shoulder harness

## 2012-09-08 ENCOUNTER — Ambulatory Visit: Payer: Medicare Other

## 2012-09-08 ENCOUNTER — Ambulatory Visit (INDEPENDENT_AMBULATORY_CARE_PROVIDER_SITE_OTHER): Payer: Medicare Other | Admitting: Orthopedic Surgery

## 2012-09-08 ENCOUNTER — Ambulatory Visit (INDEPENDENT_AMBULATORY_CARE_PROVIDER_SITE_OTHER): Payer: Medicare Other

## 2012-09-08 VITALS — BP 140/82 | Ht 64.0 in | Wt 174.0 lb

## 2012-09-08 DIAGNOSIS — S42002D Fracture of unspecified part of left clavicle, subsequent encounter for fracture with routine healing: Secondary | ICD-10-CM

## 2012-09-08 DIAGNOSIS — IMO0001 Reserved for inherently not codable concepts without codable children: Secondary | ICD-10-CM

## 2012-09-08 DIAGNOSIS — S42032D Displaced fracture of lateral end of left clavicle, subsequent encounter for fracture with routine healing: Secondary | ICD-10-CM

## 2012-09-08 DIAGNOSIS — S4292XD Fracture of left shoulder girdle, part unspecified, subsequent encounter for fracture with routine healing: Secondary | ICD-10-CM

## 2012-09-08 NOTE — Patient Instructions (Signed)
CALL TO ARRANGE THERAPY

## 2012-09-08 NOTE — Progress Notes (Signed)
Patient ID: Amy Wiggins, female   DOB: 1931-09-19, 79 y.o.   MRN: [id number] Chief Complaint  Patient presents with  . Follow-up    3 week recheck left shoulder fracture with xray DOI 08/15/12   BP 140/82  Ht 5\' 4"  (1.626 m)  Wt 174 lb (78.926 kg)  BMI 29.85 kg/m2  Encounter Diagnoses  Name Primary?  . Shoulder fracture, left, with routine healing, subsequent encounter   . Clavicle fracture, left, with routine healing, subsequent encounter Yes  . Closed displaced fracture of lateral end of clavicle, left, with routine healing, subsequent encounter    X-rays for left distal clavicle fracture. She is doing much better wants to take the sling off she had x-rays today which show stable fracture pattern she can start therapy 2 times a week for 6 weeks followup 6 weeks check range of motion

## 2012-09-16 ENCOUNTER — Ambulatory Visit (HOSPITAL_COMMUNITY)
Admission: RE | Admit: 2012-09-16 | Discharge: 2012-09-16 | Disposition: A | Discharge status: Home / Self Care | Payer: Medicare Other | Source: Ambulatory Visit | Attending: Orthopedic Surgery | Admitting: Orthopedic Surgery

## 2012-09-16 DIAGNOSIS — IMO0001 Reserved for inherently not codable concepts without codable children: Secondary | ICD-10-CM | POA: Insufficient documentation

## 2012-09-16 DIAGNOSIS — M6281 Muscle weakness (generalized): Secondary | ICD-10-CM | POA: Insufficient documentation

## 2012-09-16 DIAGNOSIS — M25519 Pain in unspecified shoulder: Secondary | ICD-10-CM | POA: Insufficient documentation

## 2012-09-16 DIAGNOSIS — M25619 Stiffness of unspecified shoulder, not elsewhere classified: Secondary | ICD-10-CM | POA: Insufficient documentation

## 2012-09-16 NOTE — Evaluation (Signed)
Occupational Therapy Evaluation  Patient Details  Name: Amy Wiggins MRN: [id number] Date of Birth: 01-Jul-1931  Today's Date: 09/16/2012 Time: [id number] OT Time Calculation (min): 34 min OT eval [id number] 34'  Visit#: 1 of 12  Re-eval: 10/14/12  Assessment Diagnosis: left distal clavicle fx Next MD Visit: 10/20/12 Aline Brochure  Authorization: Medicare  Authorization Time Period: before 10th visit  Authorization Visit#: 1 of 10   Past Medical History:  Past Medical History  Diagnosis Date  . Hypertension   . Diabetes mellitus   . Stroke     TIA's x 2   Past Surgical History: No past surgical history on file.  Subjective Symptoms/Limitations Symptoms: S: I still have pain in my shoulder. Pertinent History: Amy Wiggins sustained a left clavicle fx on 08/15/12 when falling out of the tub. Patient has also had a fall on her patio when walking into her home with a bag of groceries. Patient did not re-injury her clavicle and sustained a large bruise on left forearm. Dr. Aline Brochure has referred patient to occupational therapy for evaluation and treatment.  Limitations: (7/23-8/13)Full AROM/PROM by 6 weeks. Active and light resistance as tolerated. (8/13-9/10) Full AROM in all planes. No heavy lifting. (9/10 and beyond) Full AROM. Advance strengthening as tolerated.  Patient Stated Goals: To use her left arm as before.  Pain Assessment Currently in Pain?: Yes Pain Score: 7  Pain Location: Shoulder Pain Orientation: Left Pain Type: Acute pain Pain Frequency: Constant Pain Relieving Factors: cold pack, pain meds  Precautions/Restrictions   None  Balance Screening Balance Screen Has the patient fallen in the past 6 months: Yes How many times?: 2 Has the patient had a decrease in activity level because of a fear of falling? : No Is the patient reluctant to leave their home because of a fear of falling? : No  Prior Functioning  Home Living Family/patient expects to be  discharged to:: Private residence Living Arrangements: Alone Home Equipment: Shower seat;Grab bars - tub/shower (life alert) Prior Function Level of Independence: Independent with basic ADLs;Independent with transfers  Able to Take Stairs?:  (hesistant) Driving: Yes Vocation: Retired Leisure: Hobbies-yes (Comment)  Assessment ADL/Vision/Perception ADL ADL Comments: Patient has difficulty donning/doffing shirt, making the bed, doing housekeeping chores, and using her arm for all daily activities. Patient normally cooks one meal a week for her sister and has not been able to do this since her fall.  Dominant Hand: Right Vision - History Baseline Vision: No visual deficits  Cognition/Observation Cognition Overall Cognitive Status: Within Functional Limits for tasks assessed Arousal/Alertness: Awake/alert Orientation Level: Oriented X4   Additional Assessments LUE Assessment LUE Assessment:  (assessed supine. IR/ER Adducted) LUE AROM (degrees) Left Shoulder Flexion: 125 Degrees Left Shoulder ABduction: 105 Degrees Left Shoulder Internal Rotation: 85 Degrees Left Shoulder External Rotation: 60 Degrees LUE PROM (degrees) Left Shoulder Flexion: 146 Degrees Left Shoulder ABduction: 120 Degrees Left Shoulder Internal Rotation: 90 Degrees Left Shoulder External Rotation: 70 Degrees LUE Strength Left Shoulder Flexion: 2+/5 Left Shoulder ABduction: 2+/5 Left Shoulder Internal Rotation: 3/5 Left Shoulder External Rotation: 3/5 Palpation Palpation: Moderate fascial restrictions in left upper arm, trapezius, clavicle, and scapularis region.       Occupational Therapy Assessment and Plan OT Assessment and Plan Clinical Impression Statement: A: Patient presents with increased pain and fascial restrictions in her left shoulder region s/p left distal clavicle fx , causing decreased AROM, PROM, strength, and functional use of her left arm with daily activities.  Pt will benefit from  skilled therapeutic intervention in order to improve on the following deficits: Increased fascial restricitons;Decreased range of motion;Pain;Decreased strength Rehab Potential: Excellent OT Frequency: Min 2X/week OT Duration: 6 weeks OT Treatment/Interventions: Self-care/ADL training;Therapeutic activities;Therapeutic exercise;Manual therapy;Modalities;Patient/family education OT Plan:  P: Skilled OT intervention to decrease pain and fascial restrictions and increase pain free mobility and strength in left upper extremity, needed to return to prior level of independence with all B/IADLs, and leisure activities. Treatment Plan: MFR and manual stretching in supine. Follow protocol. Supine PROM until full range. Progress to supine dowel rod exercises, progress to AROM. Seated elev, ext, row, ball stretches, prot/ret//elev/dep, thumb tacks, progress as tolerated.    Goals Short Term Goals Time to Complete Short Term Goals: 3 weeks Short Term Goal 1: Patient will be educated on HEP.  Short Term Goal 2: Patient will increase left shoulder PROM to [facility] for increased abiilty to don and doff shirts with less pain. Short Term Goal 3: Patient will improve left shoulder strength to 3/5 for increased ability to make the bed. Short Term Goal 4: Patient will decrease fascial restrictons to min in her left shoulder region.  Short Term Goal 5: Pain level will be at a 5/10 or less when performing daily activities.  Long Term Goals Time to Complete Long Term Goals: 6 weeks Long Term Goal 1: Patient will return to highest level of independence with all daily and leisure acitivities.  Long Term Goal 2: Patient will increase left shoulder AROM to WNL for increased ability to reach into overhead cabinets.  Long Term Goal 3: Patient will increase left shoulder strength to 4/5 for increased abiity to perform housekeeping tasks. Long Term Goal 4: Patient will decrease fascial restrictions to trace in her right shoulder  region.   Problem List Patient Active Problem List   Diagnosis Date Noted  . Muscle weakness (generalized) 09/16/2012  . Pain in joint, shoulder region 09/16/2012  . Closed displaced fracture of lateral end of clavicle 08/18/2012    End of Session Activity Tolerance: Patient tolerated treatment well General Behavior During Therapy: [facility] for tasks assessed/performed OT Plan of Care OT Home Exercise Plan: towel slides OT Patient Instructions: handout-scanned Consulted and Agree with Plan of Care: Patient  GO Functional Assessment Tool Used: DASH score of 52.2 with ideal score of 0. Functional Limitation: Carrying, moving and handling objects Carrying, Moving and Handling Objects Current Status [phone]): At least 40 percent but less than 60 percent impaired, limited or restricted Carrying, Moving and Handling Objects Goal Status [phone]): At least 1 percent but less than 20 percent impaired, limited or restricted   Ailene Ravel, OTR/L,CBIS   09/16/2012, 11:01 AM  Physician Documentation Your signature is required to indicate approval of the treatment plan as stated above.  Please sign and either send electronically or make a copy of this report for your files and return this physician signed original.  Please mark one 1.__approve of plan  2. ___approve of plan with the following conditions.   ______________________________                                                          _____________________ Physician Signature  Date  

## 2012-09-18 ENCOUNTER — Ambulatory Visit (HOSPITAL_COMMUNITY)
Admission: RE | Admit: 2012-09-18 | Discharge: 2012-09-18 | Disposition: A | Discharge status: Home / Self Care | Payer: Medicare Other | Source: Ambulatory Visit | Attending: Orthopedic Surgery | Admitting: Orthopedic Surgery

## 2012-09-18 NOTE — Progress Notes (Signed)
Occupational Therapy Treatment Patient Details  Name: Amy Wiggins MRN: [id number] Date of Birth: October 14, 1931  Today's Date: 09/18/2012 Time: 1350-1430 OT Time Calculation (min): 40 min MFR 1350-1403  13' Therex [id number] 27'  Visit#: 2 of 12  Re-eval: 10/14/12    Authorization: Medicare  Authorization Time Period: before 10th visit  Authorization Visit#: 2 of 10  Subjective Symptoms/Limitations Symptoms: S; My shoulder doesn't hurt unless I move it.  Pain Assessment Currently in Pain?: No/denies  Precautions/Restrictions  Precautions Precautions: None  Exercise/Treatments Supine Protraction: PROM;10 reps Horizontal ABduction: PROM;10 reps External Rotation: PROM;10 reps Internal Rotation: PROM;10 reps Flexion: PROM;10 reps ABduction: PROM;10 reps Seated Elevation: AROM;10 reps Extension: AROM;10 reps Row: AROM;10 reps Therapy Ball Flexion:  (12X) ABduction:  (12X) ROM / Strengthening / Isometric Strengthening Thumb Tacks: 1' Prot/Ret//Elev/Dep: 1' with max facilitation and assist for technique Other ROM/Strengthening Exercises: shoulder rolls; 10X       Manual Therapy Manual Therapy: Myofascial release Myofascial Release: MFR and manual stretching to left upper arm, trapezius and scapularis to decrease fascial restrictions and increase joint mobility in a pain free zone.   Occupational Therapy Assessment and Plan OT Assessment and Plan Clinical Impression Statement: A: Slight pain at end stretch during manual stretching. Needed increased fascilitation during wall exercise. Vc's for proper posture when completing exercises.  OT Plan: P: Cont. with PROM supine until full range. Work on performing dep/elev/ret/pro with less hands on assistance.    Goals Short Term Goals Time to Complete Short Term Goals: 3 weeks Short Term Goal 1: Patient will be educated on HEP.  Short Term Goal 1 Progress: Progressing toward goal Short Term Goal 2: Patient will  increase left shoulder PROM to [facility] for increased abiilty to don and doff shirts with less pain. Short Term Goal 2 Progress: Progressing toward goal Short Term Goal 3: Patient will improve left shoulder strength to 3/5 for increased ability to make the bed. Short Term Goal 3 Progress: Progressing toward goal Short Term Goal 4: Patient will decrease fascial restrictons to min in her left shoulder region.  Short Term Goal 4 Progress: Progressing toward goal Short Term Goal 5: Pain level will be at a 5/10 or less when performing daily activities.  Short Term Goal 5 Progress: Progressing toward goal Long Term Goals Time to Complete Long Term Goals: 6 weeks Long Term Goal 1: Patient will return to highest level of independence with all daily and leisure acitivities.  Long Term Goal 1 Progress: Progressing toward goal Long Term Goal 2: Patient will increase left shoulder AROM to WNL for increased ability to reach into overhead cabinets.  Long Term Goal 2 Progress: Progressing toward goal Long Term Goal 3: Patient will increase left shoulder strength to 4/5 for increased abiity to perform housekeeping tasks. Long Term Goal 3 Progress: Progressing toward goal Long Term Goal 4: Patient will decrease fascial restrictions to trace in her right shoulder region.  Long Term Goal 4 Progress: Progressing toward goal  Problem List Patient Active Problem List   Diagnosis Date Noted  . Muscle weakness (generalized) 09/16/2012  . Pain in joint, shoulder region 09/16/2012  . Closed displaced fracture of lateral end of clavicle 08/18/2012    End of Session Activity Tolerance: Patient tolerated treatment well General Behavior During Therapy: [facility] for tasks assessed/performed OT Plan of Care OT Home Exercise Plan: towel slides OT Patient Instructions: handout-scanned Consulted and Agree with Plan of Care: Patient   Ailene Ravel, OTR/L,CBIS   09/18/2012, 2:32 PM

## 2012-09-22 ENCOUNTER — Ambulatory Visit (HOSPITAL_COMMUNITY)
Admission: RE | Admit: 2012-09-22 | Discharge: 2012-09-22 | Disposition: A | Discharge status: Home / Self Care | Payer: Medicare Other | Source: Ambulatory Visit | Attending: Orthopedic Surgery | Admitting: Orthopedic Surgery

## 2012-09-22 NOTE — Progress Notes (Signed)
Occupational Therapy Treatment Patient Details  Name: Amy Wiggins MRN: [id number] Date of Birth: 08/06/1931  Today's Date: 09/22/2012 Time: [id number] OT Time Calculation (min): 7 min\ MFR 850-900 10' Therex 900-930 30'  Visit#: 3 of 12  Re-eval: 10/14/12    Authorization: Medicare  Authorization Time Period: before 10th visit  Authorization Visit#: 3 of 10  Subjective Symptoms/Limitations Symptoms: S; My shoulder doesn't hurt unless I move it.  Pain Assessment Currently in Pain?: No/denies  Precautions/Restrictions  Precautions Precautions: None  Exercise/Treatments Supine Protraction: PROM;10 reps Horizontal ABduction: PROM;10 reps External Rotation: PROM;10 reps Internal Rotation: PROM;10 reps Flexion: PROM;10 reps ABduction: PROM;10 reps Seated Elevation: AROM;12 reps Extension: AROM;12 reps Row: AROM;12 reps Pulleys Flexion: 1 minute ABduction: 1 minute (with faciliation from therapist for form) Therapy Ball Flexion: 15 reps ABduction: 15 reps ROM / Strengthening / Isometric Strengthening Thumb Tacks: 1' Prot/Ret//Elev/Dep: 1' with max facilitation and assist for technique Other ROM/Strengthening Exercises: shoulder rolls; 10X       Manual Therapy Manual Therapy: Myofascial release Myofascial Release: MFR and manual stretching to left upper arm, trapezius and scapularis to decrease fascial restrictions and increase joint mobility in a pain free zone  Occupational Therapy Assessment and Plan OT Assessment and Plan Clinical Impression Statement: A: Continues to require faciliation for pro/ret/elev/dep. vc's for proper posture during exercises.  OT Plan: P: Attempt AAROM supine.   Goals Short Term Goals Time to Complete Short Term Goals: 3 weeks Short Term Goal 1: Patient will be educated on HEP.  Short Term Goal 1 Progress: Progressing toward goal Short Term Goal 2: Patient will increase left shoulder PROM to [facility] for increased abiilty to don  and doff shirts with less pain. Short Term Goal 2 Progress: Progressing toward goal Short Term Goal 3: Patient will improve left shoulder strength to 3/5 for increased ability to make the bed. Short Term Goal 3 Progress: Progressing toward goal Short Term Goal 4: Patient will decrease fascial restrictons to min in her left shoulder region.  Short Term Goal 4 Progress: Progressing toward goal Short Term Goal 5: Pain level will be at a 5/10 or less when performing daily activities.  Short Term Goal 5 Progress: Progressing toward goal Long Term Goals Time to Complete Long Term Goals: 6 weeks Long Term Goal 1: Patient will return to highest level of independence with all daily and leisure acitivities.  Long Term Goal 1 Progress: Progressing toward goal Long Term Goal 2: Patient will increase left shoulder AROM to WNL for increased ability to reach into overhead cabinets.  Long Term Goal 2 Progress: Progressing toward goal Long Term Goal 3: Patient will increase left shoulder strength to 4/5 for increased abiity to perform housekeeping tasks. Long Term Goal 3 Progress: Progressing toward goal Long Term Goal 4: Patient will decrease fascial restrictions to trace in her right shoulder region.  Long Term Goal 4 Progress: Progressing toward goal  Problem List Patient Active Problem List   Diagnosis Date Noted  . Muscle weakness (generalized) 09/16/2012  . Pain in joint, shoulder region 09/16/2012  . Closed displaced fracture of lateral end of clavicle 08/18/2012    End of Session Activity Tolerance: Patient tolerated treatment well General Behavior During Therapy: [facility] for tasks assessed/performed   Ailene Ravel, OTR/L,CBIS   09/22/2012, 9:29 AM

## 2012-09-25 ENCOUNTER — Ambulatory Visit (HOSPITAL_COMMUNITY)
Admission: RE | Admit: 2012-09-25 | Discharge: 2012-09-25 | Disposition: A | Discharge status: Home / Self Care | Payer: Medicare Other | Source: Ambulatory Visit | Attending: Internal Medicine | Admitting: Internal Medicine

## 2012-09-25 DIAGNOSIS — M25519 Pain in unspecified shoulder: Secondary | ICD-10-CM | POA: Insufficient documentation

## 2012-09-25 DIAGNOSIS — IMO0001 Reserved for inherently not codable concepts without codable children: Secondary | ICD-10-CM | POA: Insufficient documentation

## 2012-09-25 DIAGNOSIS — M6281 Muscle weakness (generalized): Secondary | ICD-10-CM | POA: Insufficient documentation

## 2012-09-25 DIAGNOSIS — M25619 Stiffness of unspecified shoulder, not elsewhere classified: Secondary | ICD-10-CM | POA: Insufficient documentation

## 2012-09-25 NOTE — Progress Notes (Signed)
Occupational Therapy Treatment Patient Details  Name: Amy Wiggins MRN: [id number] Date of Birth: February 20, 1932  Today's Date: 09/25/2012 Time: [id number] OT Time Calculation (min): 40 min MFR 805-815 10' Therex [id number] 30'  Visit#: 4 of 12  Re-eval: 10/14/12    Authorization: Medicare  Authorization Time Period: before 10th visit  Authorization Visit#: 4 of 10  Subjective Symptoms/Limitations Symptoms: S: MY shoulder all day yesterday. I've been doing more at home though.  Pain Assessment Currently in Pain?: Yes Pain Score: 5  Pain Location: Shoulder Pain Orientation: Left Pain Type: Acute pain  Precautions/Restrictions  Precautions Precautions: None  Exercise/Treatments Supine Protraction: PROM;AAROM;10 reps Horizontal ABduction: PROM;AAROM;10 reps External Rotation: PROM;AAROM;10 reps Internal Rotation: PROM;AAROM;10 reps Flexion: PROM;AAROM;10 reps ABduction: PROM;AAROM;10 reps Seated Elevation: AROM;10 reps Extension: AROM;10 reps Row: AROM;10 reps Protraction: AAROM;10 reps Horizontal ABduction: AAROM;10 reps External Rotation: AAROM;10 reps Internal Rotation: AAROM;10 reps Flexion: AAROM;10 reps Abduction: AAROM;10 reps Pulleys Flexion: 1 minute ABduction: 1 minute ROM / Strengthening / Isometric Strengthening Thumb Tacks: 1'      Manual Therapy Manual Therapy: Myofascial release Myofascial Release: MFR and manual stretching to left upper arm, trapezius and scapularis to decrease fascial restrictions and increase joint mobility in a pain free zone  Occupational Therapy Assessment and Plan OT Assessment and Plan Clinical Impression Statement: A: Less facilitation needed for exercises. Added AAROM supine and seated. Tolerated well. vc's for technique and to slow down movement.  OT Plan: P: Work on increasing independence with exercises requiring less vc's for proper form and technique.    Goals Short Term Goals Time to Complete Short Term  Goals: 3 weeks Short Term Goal 1: Patient will be educated on HEP.  Short Term Goal 2: Patient will increase left shoulder PROM to [facility] for increased abiilty to don and doff shirts with less pain. Short Term Goal 3: Patient will improve left shoulder strength to 3/5 for increased ability to make the bed. Short Term Goal 4: Patient will decrease fascial restrictons to min in her left shoulder region.  Short Term Goal 5: Pain level will be at a 5/10 or less when performing daily activities.  Long Term Goals Time to Complete Long Term Goals: 6 weeks Long Term Goal 1: Patient will return to highest level of independence with all daily and leisure acitivities.  Long Term Goal 2: Patient will increase left shoulder AROM to WNL for increased ability to reach into overhead cabinets.  Long Term Goal 3: Patient will increase left shoulder strength to 4/5 for increased abiity to perform housekeeping tasks. Long Term Goal 4: Patient will decrease fascial restrictions to trace in her right shoulder region.   Problem List Patient Active Problem List   Diagnosis Date Noted  . Muscle weakness (generalized) 09/16/2012  . Pain in joint, shoulder region 09/16/2012  . Closed displaced fracture of lateral end of clavicle 08/18/2012    End of Session Activity Tolerance: Patient tolerated treatment well General Behavior During Therapy: [facility] for tasks assessed/performed   Ailene Ravel, OTR/L,CBIS   09/25/2012, 10:01 AM

## 2012-09-30 ENCOUNTER — Ambulatory Visit (HOSPITAL_COMMUNITY)
Admission: RE | Admit: 2012-09-30 | Discharge: 2012-09-30 | Disposition: A | Discharge status: Home / Self Care | Payer: Medicare Other | Source: Ambulatory Visit | Attending: Internal Medicine | Admitting: Internal Medicine

## 2012-09-30 NOTE — Progress Notes (Signed)
Occupational Therapy Treatment Patient Details  Name: Amy Wiggins MRN: [id number] Date of Birth: Mar 21, 1931  Today's Date: 09/30/2012 Time: [id number] OT Time Calculation (min): 45 min Manual Therapy 1310-1328 18' Therapeytuc Exercises [phone] 79' Visit#: 5 of 12  Re-eval: 10/14/12    Authorization: Medicare  Authorization Time Period: before 10th visit  Authorization Visit#: 5 of 10  Subjective  S:  I want to keep coming to therapy.  I can tell it is helping.  Limitations: (7/23-8/13)Full AROM/PROM by 6 weeks. Active and light resistance as tolerated. (8/13-9/10) Full AROM in all planes. No heavy lifting. (9/10 and beyond) Full AROM. Advance strengthening as tolerated.  Pain Assessment Currently in Pain?: Yes Pain Score: 3  Pain Location: Shoulder Pain Orientation: Left Pain Type: Acute pain  Precautions/Restrictions     (7/23-8/13)Full AROM/PROM by 6 weeks. Active and light resistance as tolerated. (8/13-9/10) Full AROM in all planes. No heavy lifting. (9/10 and beyond) Full AROM. Advance strengthening as tolerated.    Exercise/Treatments Supine Protraction: PROM;10 reps;AAROM;15 reps Horizontal ABduction: PROM;10 reps;AAROM;15 reps External Rotation: PROM;10 reps;AAROM;15 reps Internal Rotation: PROM;10 reps;AAROM;15 reps Flexion: PROM;10 reps;AAROM;15 reps ABduction: PROM;10 reps;AAROM;15 reps Seated Elevation: AROM;15 reps Extension: AROM;15 reps Row: AROM;15 reps Protraction: AAROM;10 reps Horizontal ABduction: AAROM;10 reps External Rotation: AAROM;10 reps Internal Rotation: AAROM;10 reps Flexion: AAROM;10 reps Abduction: AAROM;10 reps Therapy Ball Flexion: 20 reps ABduction: 20 reps ROM / Strengthening / Isometric Strengthening Wall Wash: 2' Other ROM/Strengthening Exercises: shoulder rolls 10X forward and 10X reverse   Manual Therapy Manual Therapy: Myofascial release Myofascial Release: MFR and manual stretching to left upper arm,trapezius,  scapular region, shoulder region, clavicle region to decrease pain and fascial restrictions and increase pain free shoulder mobiltiy.   Occupational Therapy Assessment and Plan OT Assessment and Plan Clinical Impression Statement: A:  Able to complete shoulder shrugs forward and reverse with vg only.  AAROM in supine is WFL. OT Plan: P:  Attempt AROM in supine.   Goals Short Term Goals Time to Complete Short Term Goals: 3 weeks Short Term Goal 1: Patient will be educated on HEP.  Short Term Goal 1 Progress: Progressing toward goal Short Term Goal 2: Patient will increase left shoulder PROM to [facility] for increased abiilty to don and doff shirts with less pain. Short Term Goal 2 Progress: Progressing toward goal Short Term Goal 3: Patient will improve left shoulder strength to 3/5 for increased ability to make the bed. Short Term Goal 3 Progress: Progressing toward goal Short Term Goal 4: Patient will decrease fascial restrictons to min in her left shoulder region.  Short Term Goal 4 Progress: Progressing toward goal Short Term Goal 5: Pain level will be at a 5/10 or less when performing daily activities.  Short Term Goal 5 Progress: Progressing toward goal Long Term Goals Time to Complete Long Term Goals: 6 weeks Long Term Goal 1: Patient will return to highest level of independence with all daily and leisure acitivities.  Long Term Goal 1 Progress: Progressing toward goal Long Term Goal 2: Patient will increase left shoulder AROM to WNL for increased ability to reach into overhead cabinets.  Long Term Goal 2 Progress: Progressing toward goal Long Term Goal 3: Patient will increase left shoulder strength to 4/5 for increased abiity to perform housekeeping tasks. Long Term Goal 3 Progress: Progressing toward goal Long Term Goal 4: Patient will decrease fascial restrictions to trace in her right shoulder region.  Long Term Goal 4 Progress: Progressing toward goal  Problem List Patient  Active Problem List   Diagnosis Date Noted  . Muscle weakness (generalized) 09/16/2012  . Pain in joint, shoulder region 09/16/2012  . Closed displaced fracture of lateral end of clavicle 08/18/2012       [place], OTR/L  09/30/2012, 2:10 PM

## 2012-10-02 ENCOUNTER — Ambulatory Visit (HOSPITAL_COMMUNITY)
Admission: RE | Admit: 2012-10-02 | Discharge: 2012-10-02 | Disposition: A | Discharge status: Home / Self Care | Payer: Medicare Other | Source: Ambulatory Visit | Attending: Specialist | Admitting: Specialist

## 2012-10-02 NOTE — Progress Notes (Signed)
Occupational Therapy Treatment Patient Details  Name: Amy Wiggins MRN: [id number] Date of Birth: May 18, 1931  Today's Date: 10/02/2012 Time: [id number] OT Time Calculation (min): 39 min Manual Therapy [id number] 13' Therapeutic Exercises [id number] 26' Visit#: 6 of 12  Re-eval: 10/14/12    Authorization: Medicare  Authorization Time Period: before 10th visit  Authorization Visit#: 6 of 10  Subjective Symptoms/Limitations Symptoms: S:  I think the weather is making me a bit sore. Limitations: (7/23-8/13)Full AROM/PROM by 6 weeks. Active and light resistance as tolerated. (8/13-9/10) Full AROM in all planes. No heavy lifting. (9/10 and beyond) Full AROM. Advance strengthening as tolerated.  Pain Assessment Currently in Pain?: Yes Pain Score: 3  Pain Location: Shoulder Pain Orientation: Left Pain Type: Acute pain  Precautions/Restrictions     Exercise/Treatments Supine Protraction: PROM;AROM;10 reps Horizontal ABduction: PROM;AROM;10 reps External Rotation: PROM;AROM;10 reps Internal Rotation: PROM;AROM;10 reps Flexion: PROM;AROM;10 reps ABduction: PROM;AROM;10 reps Seated Protraction: AROM;10 reps Horizontal ABduction: AROM;10 reps External Rotation: AROM;10 reps Internal Rotation: AROM;10 reps Flexion: AROM;10 reps Abduction: AROM;10 reps Standing External Rotation: Theraband;10 reps Theraband Level (Shoulder External Rotation): Level 2 (Red) Internal Rotation: Theraband;10 reps Theraband Level (Shoulder Internal Rotation): Level 2 (Red) Extension: Theraband;10 reps Theraband Level (Shoulder Extension): Level 2 (Red) Row: Theraband;10 reps Theraband Level (Shoulder Row): Level 2 (Red) Retraction: Theraband;10 reps Theraband Level (Shoulder Retraction): Level 2 (Red) Therapy Ball Flexion: 20 reps ABduction: 20 reps Right/Left: 5 reps ROM / Strengthening / Isometric Strengthening Wall Wash: 2' Proximal Shoulder Strengthening, Supine: 10X each with mod vg  and min tactile cues for correct form Other ROM/Strengthening Exercises: shoulder rolls 10X forward and 10X reverse      Manual Therapy Manual Therapy: Myofascial release Myofascial Release: MFR and manual stretching to left upper arm,trapezius, scapular region, shoulder region, clavicle region to decrease pain and fascial restrictions and increase pain free shoulder mobilty  Occupational Therapy Assessment and Plan OT Assessment and Plan Clinical Impression Statement: A:  Added AROM exercises in supine and seated.  Patient requires facilitation with ER for proper form and technique. OT Plan: P:  Add UBE for increased shoulder strengthening needed for proximal shoulder stability.    Goals Short Term Goals Time to Complete Short Term Goals: 3 weeks Short Term Goal 1: Patient will be educated on HEP.  Short Term Goal 1 Progress: Progressing toward goal Short Term Goal 2: Patient will increase left shoulder PROM to [facility] for increased abiilty to don and doff shirts with less pain. Short Term Goal 2 Progress: Progressing toward goal Short Term Goal 3: Patient will improve left shoulder strength to 3/5 for increased ability to make the bed. Short Term Goal 3 Progress: Progressing toward goal Short Term Goal 4: Patient will decrease fascial restrictons to min in her left shoulder region.  Short Term Goal 4 Progress: Progressing toward goal Short Term Goal 5: Pain level will be at a 5/10 or less when performing daily activities.  Short Term Goal 5 Progress: Progressing toward goal Long Term Goals Time to Complete Long Term Goals: 6 weeks Long Term Goal 1: Patient will return to highest level of independence with all daily and leisure acitivities.  Long Term Goal 1 Progress: Progressing toward goal Long Term Goal 2: Patient will increase left shoulder AROM to WNL for increased ability to reach into overhead cabinets.  Long Term Goal 2 Progress: Progressing toward goal Long Term Goal 3: Patient  will increase left shoulder strength to 4/5 for increased abiity to perform housekeeping tasks. Long Term  Goal 3 Progress: Progressing toward goal Long Term Goal 4: Patient will decrease fascial restrictions to trace in her right shoulder region.  Long Term Goal 4 Progress: Progressing toward goal  Problem List Patient Active Problem List   Diagnosis Date Noted  . Muscle weakness (generalized) 09/16/2012  . Pain in joint, shoulder region 09/16/2012  . Closed displaced fracture of lateral end of clavicle 08/18/2012    End of Session Activity Tolerance: Patient tolerated treatment well General Behavior During Therapy: [facility] for tasks assessed/performed  [place], OTR/L  10/02/2012, 1:49 PM

## 2012-10-07 ENCOUNTER — Ambulatory Visit (HOSPITAL_COMMUNITY): Payer: Medicare Other

## 2012-10-09 ENCOUNTER — Ambulatory Visit (HOSPITAL_COMMUNITY)
Admission: RE | Admit: 2012-10-09 | Discharge: 2012-10-09 | Disposition: A | Discharge status: Home / Self Care | Payer: Medicare Other | Source: Ambulatory Visit | Attending: Orthopedic Surgery | Admitting: Orthopedic Surgery

## 2012-10-09 NOTE — Progress Notes (Signed)
Occupational Therapy Treatment Patient Details  Name: Amy Wiggins MRN: [id number] Date of Birth: 1931/10/30  Today's Date: 10/09/2012 Time: [id number] OT Time Calculation (min): 41 min MFR 1310-1320 10' Therex [id number] 31'  Visit#: 7 of 12  Re-eval: 10/14/12    Authorization: Medicare  Authorization Time Period: before 10th visit  Authorization Visit#: 7 of 10  Subjective Symptoms/Limitations Symptoms: S: I think I'll only be coming a few more times. My shoulder is better than it was.  Pain Assessment Currently in Pain?: Yes Pain Score: 4  Pain Location: Shoulder Pain Orientation: Left Pain Type: Acute pain  Precautions/Restrictions  Precautions Precautions: None  Exercise/Treatments Supine Protraction: PROM;10 reps;AROM;12 reps Horizontal ABduction: PROM;10 reps;AROM;12 reps External Rotation: PROM;10 reps;AROM;12 reps Internal Rotation: PROM;10 reps;AROM;12 reps Flexion: PROM;10 reps;AROM;12 reps ABduction: PROM;10 reps;AROM;12 reps Therapy Ball Flexion:  (d/c) ABduction:  (d/c) ROM / Strengthening / Isometric Strengthening UBE (Upper Arm Bike): 1.0 3' forward 3' reverse X to V Arms: 10X Pendulum: unable to do technique correctly Proximal Shoulder Strengthening, Supine: 10X      Manual Therapy Manual Therapy: Myofascial release Myofascial Release: MFR and manual stretching to left upper arm,trapezius, scapular region, shoulder region, clavicle region to decrease pain and fascial restrictions and increase pain free shoulder mobilty  Occupational Therapy Assessment and Plan OT Assessment and Plan Clinical Impression Statement: A: Added UBE bike. Pt tolerated well.  OT Plan: P: Add ball on the wall.    Goals Short Term Goals Time to Complete Short Term Goals: 3 weeks Short Term Goal 1: Patient will be educated on HEP.  Short Term Goal 1 Progress: Progressing toward goal Short Term Goal 2: Patient will increase left shoulder PROM to [facility] for  increased abiilty to don and doff shirts with less pain. Short Term Goal 2 Progress: Progressing toward goal Short Term Goal 3: Patient will improve left shoulder strength to 3/5 for increased ability to make the bed. Short Term Goal 3 Progress: Progressing toward goal Short Term Goal 4: Patient will decrease fascial restrictons to min in her left shoulder region.  Short Term Goal 4 Progress: Progressing toward goal Short Term Goal 5: Pain level will be at a 5/10 or less when performing daily activities.  Short Term Goal 5 Progress: Progressing toward goal Long Term Goals Time to Complete Long Term Goals: 6 weeks Long Term Goal 1: Patient will return to highest level of independence with all daily and leisure acitivities.  Long Term Goal 1 Progress: Progressing toward goal Long Term Goal 2: Patient will increase left shoulder AROM to WNL for increased ability to reach into overhead cabinets.  Long Term Goal 2 Progress: Progressing toward goal Long Term Goal 3: Patient will increase left shoulder strength to 4/5 for increased abiity to perform housekeeping tasks. Long Term Goal 3 Progress: Progressing toward goal Long Term Goal 4: Patient will decrease fascial restrictions to trace in her right shoulder region.  Long Term Goal 4 Progress: Progressing toward goal  Problem List Patient Active Problem List   Diagnosis Date Noted  . Muscle weakness (generalized) 09/16/2012  . Pain in joint, shoulder region 09/16/2012  . Closed displaced fracture of lateral end of clavicle 08/18/2012    End of Session Activity Tolerance: Patient tolerated treatment well General Behavior During Therapy: [facility] for tasks assessed/performed   Ailene Ravel, OTR/L,CBIS   10/09/2012, 1:52 PM

## 2012-10-14 ENCOUNTER — Ambulatory Visit (HOSPITAL_COMMUNITY)
Admission: RE | Admit: 2012-10-14 | Discharge: 2012-10-14 | Disposition: A | Discharge status: Home / Self Care | Payer: Medicare Other | Source: Ambulatory Visit | Attending: Internal Medicine | Admitting: Internal Medicine

## 2012-10-14 NOTE — Evaluation (Addendum)
Occupational Therapy Discharge Summary  Patient Details  Name: Amy Wiggins MRN: [id number] Date of Birth: Sep 12, 1931  Today's Date: 10/14/2012 Time: [id number] OT Time Calculation (min): 43 min MFR [id number] 18' Reassess 1320-1345 25'  Visit#: 8 of 12  Re-eval: 10/14/12  Assessment Diagnosis: left distal clavicle fx  Authorization: Medicare  Authorization Time Period: before 10th visit  Authorization Visit#: 8 of 10   Past Medical History:  Past Medical History  Diagnosis Date  . Hypertension   . Diabetes mellitus   . Stroke     TIA's x 2   Past Surgical History: No past surgical history on file.  Subjective Pain Assessment Pain Score: 0-No pain  Precautions/Restrictions  Precautions Precautions: None   Assessment  Additional Assessments LUE Assessment LUE Assessment:  (Assessed supine. IR/ER adducted) LUE AROM (degrees) Left Shoulder Flexion: 145 Degrees (on eval: 125) Left Shoulder ABduction: 116 Degrees (on eval: 105) Left Shoulder Internal Rotation: 90 Degrees (on eval: 85) Left Shoulder External Rotation: 90 Degrees (on eval: 60) LUE Strength Left Shoulder Flexion:  (4+/5 (2+/5: eval)) Left Shoulder ABduction:  (4+/5 (2+/5: eval)) Left Shoulder Internal Rotation:  (4+/5 (3/5: eval)) Left Shoulder External Rotation:  (4+/5 (3/5: eval)) Palpation Palpation: trace fascial restrictions in left upper arm, trapezius, clavicle, and scapularis region.      Occupational Therapy Assessment and Plan OT Assessment and Plan Clinical Impression Statement: A: Reassessment completed this date. Patient has met all short term and long term goals and will be discharged. OT Plan: P: D/C from therapy.   Goals Home Exercise Program Pt/caregiver will Perform Home Exercise Program: For increased ROM Short Term Goals Time to Complete Short Term Goals: 3 weeks Short Term Goal 1: Patient will be educated on HEP.  Short Term Goal 1 Progress: Met Short Term Goal 2:  Patient will increase left shoulder PROM to [facility] for increased abiilty to don and doff shirts with less pain. Short Term Goal 2 Progress: Met Short Term Goal 3: Patient will improve left shoulder strength to 3/5 for increased ability to make the bed. Short Term Goal 3 Progress: Met Short Term Goal 4: Patient will decrease fascial restrictons to min in her left shoulder region.  Short Term Goal 4 Progress: Met Short Term Goal 5: Pain level will be at a 5/10 or less when performing daily activities.  Short Term Goal 5 Progress: Met Long Term Goals Time to Complete Long Term Goals: 6 weeks Long Term Goal 1: Patient will return to highest level of independence with all daily and leisure acitivities.  Long Term Goal 1 Progress: Met Long Term Goal 2: Patient will increase left shoulder AROM to WNL for increased ability to reach into overhead cabinets.  Long Term Goal 2 Progress: Met Long Term Goal 3: Patient will increase left shoulder strength to 4/5 for increased abiity to perform housekeeping tasks. Long Term Goal 3 Progress: Met Long Term Goal 4: Patient will decrease fascial restrictions to trace in her right shoulder region.  Long Term Goal 4 Progress: Met  Problem List Patient Active Problem List   Diagnosis Date Noted  . Muscle weakness (generalized) 09/16/2012  . Pain in joint, shoulder region 09/16/2012  . Closed displaced fracture of lateral end of clavicle 08/18/2012       GO Functional Assessment Tool Used: DASH score of 6.8 with ideal score of 0. (On eval: 52.2) Functional Limitation: Carrying, moving and handling objects Carrying, Moving and Handling Objects Current Status [phone]): At least 1 percent but less  than 20 percent impaired, limited or restricted Carrying, Moving and Handling Objects Goal Status [phone]): At least 1 percent but less than 20 percent impaired, limited or restricted Carrying, Moving and Handling Objects Discharge Status [phone]): At least 1 percent but  less than 20 percent impaired, limited or restricted  Ailene Ravel, OTR/L,CBIS   10/14/2012, 5:59 PM  Physician Documentation Your signature is required to indicate approval of the treatment plan as stated above.  Please sign and either send electronically or make a copy of this report for your files and return this physician signed original.  Please mark one 1.__approve of plan  2. ___approve of plan with the following conditions.   ______________________________                                                          _____________________ Physician Signature                                                                                                             Date

## 2012-10-16 ENCOUNTER — Ambulatory Visit (HOSPITAL_COMMUNITY): Payer: Medicare Other | Admitting: Specialist

## 2012-10-20 ENCOUNTER — Ambulatory Visit: Payer: Medicare Other | Admitting: Orthopedic Surgery

## 2012-10-21 ENCOUNTER — Ambulatory Visit (HOSPITAL_COMMUNITY): Payer: Medicare Other

## 2012-10-23 ENCOUNTER — Ambulatory Visit (HOSPITAL_COMMUNITY): Payer: Medicare Other | Admitting: Specialist

## 2013-01-23 ENCOUNTER — Emergency Department (HOSPITAL_COMMUNITY)
Admission: EM | Admit: 2013-01-23 | Discharge: 2013-01-23 | Disposition: A | Discharge status: Home / Self Care | Payer: Medicare Other | Attending: Emergency Medicine | Admitting: Emergency Medicine

## 2013-01-23 ENCOUNTER — Encounter (HOSPITAL_COMMUNITY): Payer: Self-pay | Admitting: Emergency Medicine

## 2013-01-23 ENCOUNTER — Emergency Department (HOSPITAL_COMMUNITY): Payer: Medicare Other

## 2013-01-23 DIAGNOSIS — W268XXA Contact with other sharp object(s), not elsewhere classified, initial encounter: Secondary | ICD-10-CM | POA: Insufficient documentation

## 2013-01-23 DIAGNOSIS — Z79899 Other long term (current) drug therapy: Secondary | ICD-10-CM | POA: Insufficient documentation

## 2013-01-23 DIAGNOSIS — Z8673 Personal history of transient ischemic attack (TIA), and cerebral infarction without residual deficits: Secondary | ICD-10-CM | POA: Insufficient documentation

## 2013-01-23 DIAGNOSIS — L089 Local infection of the skin and subcutaneous tissue, unspecified: Secondary | ICD-10-CM

## 2013-01-23 DIAGNOSIS — I10 Essential (primary) hypertension: Secondary | ICD-10-CM | POA: Insufficient documentation

## 2013-01-23 DIAGNOSIS — E119 Type 2 diabetes mellitus without complications: Secondary | ICD-10-CM | POA: Insufficient documentation

## 2013-01-23 DIAGNOSIS — IMO0002 Reserved for concepts with insufficient information to code with codable children: Secondary | ICD-10-CM | POA: Insufficient documentation

## 2013-01-23 DIAGNOSIS — Y9289 Other specified places as the place of occurrence of the external cause: Secondary | ICD-10-CM | POA: Insufficient documentation

## 2013-01-23 DIAGNOSIS — Y9301 Activity, walking, marching and hiking: Secondary | ICD-10-CM | POA: Insufficient documentation

## 2013-01-23 DIAGNOSIS — Z7902 Long term (current) use of antithrombotics/antiplatelets: Secondary | ICD-10-CM | POA: Insufficient documentation

## 2013-01-23 DIAGNOSIS — Z7982 Long term (current) use of aspirin: Secondary | ICD-10-CM | POA: Insufficient documentation

## 2013-01-23 MED ORDER — SULFAMETHOXAZOLE-TMP DS 800-160 MG PO TABS: 1.0000 | ORAL_TABLET | Freq: Two times a day (BID) | ORAL | Status: DC

## 2013-01-23 MED ORDER — TRAMADOL HCL 50 MG PO TABS: 50.0000 mg | ORAL_TABLET | Freq: Four times a day (QID) | ORAL | Status: DC | PRN

## 2013-01-23 MED ORDER — SULFAMETHOXAZOLE-TMP DS 800-160 MG PO TABS
1.0000 | ORAL_TABLET | Freq: Once | ORAL | Status: AC
  Administered 2013-01-23: 1 via ORAL
  Filled 2013-01-23: qty 1

## 2013-01-23 NOTE — ED Provider Notes (Signed)
CSN: [id number]     Arrival date & time 01/23/13  1502 History   First MD Initiated Contact with Patient 01/23/13 1645     Chief Complaint  Patient presents with  . Wound Check   (Consider location/radiation/quality/duration/timing/severity/associated sxs/prior Treatment) HPI Comments: Amy Wiggins is a 79 y.o. female who presents to the Emergency Department complaining of puncture wound to the left lower leg that occurred one week ago after she accidentally stepped on a piece of wood sticking up from the ground.  States that she has been applying neosporin and band aids w/o relief.  C/o pain and redness surrounding the wound.  She denies fever, chills or red streaks.    The history is provided by the patient.    Past Medical History  Diagnosis Date  . Hypertension   . Diabetes mellitus   . Stroke     TIA's x 2   History reviewed. No pertinent past surgical history. No family history on file. History  Substance Use Topics  . Smoking status: Never Smoker   . Smokeless tobacco: Not on file  . Alcohol Use: No   OB History   Grav Para Term Preterm Abortions TAB SAB Ect Mult Living                 Review of Systems  Constitutional: Negative for fever, chills and appetite change.  Musculoskeletal: Negative for arthralgias, back pain and joint swelling.  Skin: Positive for color change and wound.       Abrasion to left lower leg  Neurological: Negative for dizziness, weakness and numbness.  Hematological: Does not bruise/bleed easily.  All other systems reviewed and are negative.    Allergies  Review of patient's allergies indicates no known allergies.  Home Medications   Current Outpatient Rx  Name  Route  Sig  Dispense  Refill  . ALPRAZolam (XANAX) 1 MG tablet   Oral   Take 0.5 mg by mouth 2 (two) times daily as needed for anxiety or sleep.         Marland Kitchen aspirin EC 81 MG tablet   Oral   Take 81 mg by mouth at bedtime. As blood thinner/ for heart         .  atorvastatin (LIPITOR) 10 MG tablet   Oral   Take 10 mg by mouth daily. For cholesterol         . buPROPion (WELLBUTRIN XL) 300 MG 24 hr tablet   Oral   Take 300 mg by mouth daily. For mood/depression or as directed by prescriber         . chlorthalidone (HYGROTON) 25 MG tablet   Oral   Take 25 mg by mouth daily. For high blood pressure/fluid         . citalopram (CELEXA) 20 MG tablet   Oral   Take 20 mg by mouth daily. For mood/depression         . clopidogrel (PLAVIX) 75 MG tablet   Oral   Take 75 mg by mouth daily. For heart/ blood thinner         . glimepiride (AMARYL) 2 MG tablet   Oral   Take 2 mg by mouth daily with breakfast. For Diabetes         . losartan (COZAAR) 100 MG tablet   Oral   Take 100 mg by mouth daily. For high blood pressure         . MAGNESIUM PO   Oral   Take  1 tablet by mouth at bedtime. For leg cramps         . Multiple Vitamin (MULTIVITAMIN WITH MINERALS) TABS tablet   Oral   Take 1 tablet by mouth at bedtime. For supplement         . pantoprazole (PROTONIX) 40 MG tablet   Oral   Take 40 mg by mouth daily. For acid reflux/gerd          BP 154/66  Pulse 82  Temp(Src) 97.9 F (36.6 C) (Oral)  Resp 20  Ht 5\' 5"  (1.651 m)  Wt 185 lb (83.915 kg)  BMI 30.79 kg/m2  SpO2 100% Physical Exam  Nursing note and vitals reviewed. Constitutional: She appears well-developed and well-nourished. No distress.  HENT:  Head: Normocephalic and atraumatic.  Cardiovascular: Normal rate, regular rhythm, normal heart sounds and intact distal pulses.   No murmur heard. Pulmonary/Chest: Effort normal and breath sounds normal. No respiratory distress.  Musculoskeletal: She exhibits tenderness. She exhibits no edema.       Left lower leg: She exhibits tenderness. She exhibits no bony tenderness, no swelling and no edema.       Legs: Neurological: She is alert. She exhibits normal muscle tone. Coordination normal.  Skin: Skin is warm and  dry.  See MS exam    ED Course  Procedures (including critical care time) Labs Review Labs Reviewed - No data to display Imaging Review Dg Tibia/fibula Left  01/23/2013   CLINICAL DATA:  79 year old female with left lower leg pain injury and swelling.  EXAM: LEFT TIBIA AND FIBULA - 2 VIEW  COMPARISON:  None.  FINDINGS: No evidence of acute fracture,subluxation or dislocation identified.  No radio-opaque foreign bodies are present.  No focal bony lesions are noted.  The joint spaces are unremarkable except for mild degenerative changes within the knee. Marland Kitchen  IMPRESSION: No evidence of acute abnormality.   Electronically Signed   By: [name] M.D.   On: 01/23/2013 18:03    EKG Interpretation   None       MDM   Small abrasion to the anterior left lower leg with 6 cm area of surrounding erythema.  Leading edge of erythema was marked by me.  Pt agrees to elevate her leg and close f/u with her PMD or to return here for any signs of increased infection.  Remains NV intact.  No promixal or distal tenderness, compartments are soft.     Harlea Goetzinger L. Vanessa , PA-C 01/23/13 1910

## 2013-01-23 NOTE — ED Notes (Signed)
Lesion on left lower leg marked by T triplett PA

## 2013-01-23 NOTE — ED Notes (Signed)
Pt reports that she was walking in the yard 1 week ago and stepped on a "stob" sticking out of the ground, went into her left lower leg. Cont. To have bruising and pain to the leg. Denies any other injury, last tetanus this year.

## 2013-01-25 NOTE — ED Provider Notes (Signed)
Medical screening examination/treatment/procedure(s) were performed by non-physician practitioner and as supervising physician I was immediately available for consultation/collaboration.  EKG Interpretation   None        Jasper Riling. Alvino Chapel, MD 01/25/13 0001

## 2013-04-26 ENCOUNTER — Other Ambulatory Visit (HOSPITAL_COMMUNITY): Payer: Self-pay | Admitting: Internal Medicine

## 2013-04-26 ENCOUNTER — Ambulatory Visit (HOSPITAL_COMMUNITY)
Admission: RE | Admit: 2013-04-26 | Discharge: 2013-04-26 | Disposition: A | Discharge status: Home / Self Care | Payer: Medicare Other | Source: Ambulatory Visit | Attending: Internal Medicine | Admitting: Internal Medicine

## 2013-04-26 DIAGNOSIS — Z1231 Encounter for screening mammogram for malignant neoplasm of breast: Secondary | ICD-10-CM

## 2013-04-27 ENCOUNTER — Telehealth (HOSPITAL_COMMUNITY): Payer: Self-pay | Admitting: *Deleted

## 2013-07-13 ENCOUNTER — Ambulatory Visit (HOSPITAL_COMMUNITY)
Admission: RE | Admit: 2013-07-13 | Discharge: 2013-07-13 | Disposition: A | Discharge status: Home / Self Care | Payer: Medicare Other | Source: Ambulatory Visit | Attending: Internal Medicine | Admitting: Internal Medicine

## 2013-07-13 ENCOUNTER — Other Ambulatory Visit (HOSPITAL_COMMUNITY): Payer: Self-pay | Admitting: Internal Medicine

## 2013-07-13 DIAGNOSIS — M79605 Pain in left leg: Secondary | ICD-10-CM

## 2013-07-13 DIAGNOSIS — M25569 Pain in unspecified knee: Secondary | ICD-10-CM

## 2013-07-29 ENCOUNTER — Ambulatory Visit: Payer: Medicare Other | Admitting: Orthopedic Surgery

## 2013-10-12 ENCOUNTER — Ambulatory Visit (INDEPENDENT_AMBULATORY_CARE_PROVIDER_SITE_OTHER): Payer: Medicare Other | Admitting: Urology

## 2013-10-12 DIAGNOSIS — N390 Urinary tract infection, site not specified: Secondary | ICD-10-CM

## 2013-10-12 DIAGNOSIS — R351 Nocturia: Secondary | ICD-10-CM

## 2013-10-12 DIAGNOSIS — N952 Postmenopausal atrophic vaginitis: Secondary | ICD-10-CM

## 2013-11-29 ENCOUNTER — Emergency Department (HOSPITAL_COMMUNITY)
Admission: EM | Admit: 2013-11-29 | Discharge: 2013-11-29 | Disposition: A | Discharge status: Home / Self Care | Payer: Medicare Other | Attending: Emergency Medicine | Admitting: Emergency Medicine

## 2013-11-29 ENCOUNTER — Emergency Department (HOSPITAL_COMMUNITY): Payer: Medicare Other

## 2013-11-29 ENCOUNTER — Encounter (HOSPITAL_COMMUNITY): Payer: Self-pay | Admitting: Emergency Medicine

## 2013-11-29 DIAGNOSIS — Z7902 Long term (current) use of antithrombotics/antiplatelets: Secondary | ICD-10-CM | POA: Insufficient documentation

## 2013-11-29 DIAGNOSIS — I10 Essential (primary) hypertension: Secondary | ICD-10-CM | POA: Insufficient documentation

## 2013-11-29 DIAGNOSIS — Z7982 Long term (current) use of aspirin: Secondary | ICD-10-CM | POA: Insufficient documentation

## 2013-11-29 DIAGNOSIS — R635 Abnormal weight gain: Secondary | ICD-10-CM | POA: Insufficient documentation

## 2013-11-29 DIAGNOSIS — E119 Type 2 diabetes mellitus without complications: Secondary | ICD-10-CM | POA: Insufficient documentation

## 2013-11-29 DIAGNOSIS — Z79899 Other long term (current) drug therapy: Secondary | ICD-10-CM | POA: Diagnosis not present

## 2013-11-29 DIAGNOSIS — Z8673 Personal history of transient ischemic attack (TIA), and cerebral infarction without residual deficits: Secondary | ICD-10-CM | POA: Diagnosis not present

## 2013-11-29 DIAGNOSIS — M79605 Pain in left leg: Secondary | ICD-10-CM | POA: Insufficient documentation

## 2013-11-29 NOTE — ED Notes (Signed)
Pt upset with time, stated that she had been out in waiting room since 1100 this morning

## 2013-11-29 NOTE — ED Provider Notes (Signed)
CSN: [id number]     Arrival date & time 11/29/13  1203 History  This chart was scribed for Hoy Morn, MD by Rayfield Citizen, ED Scribe. This patient was seen in room APA08/APA08 and the patient's care was started at 3:44 PM.    Chief Complaint  Patient presents with  . Leg Pain    The history is provided by the patient. No language interpreter was used.    HPI Comments: Amy Wiggins is a 80 y.o. female who presents to the Emergency Department complaining of approx. three days of swelling and numbness on the back of her left calf. She notes that she can feel touch to the area but describes the sensation as an intermittent tingling with associated soreness in her left ankle and foot. She notes prior experience with these symptoms; intermittent episodes over the past year. She reports a recent weight gain of approx 27lbs (175 to 202) over the past year. She denies trauma or injury to this area. She denies any other pain in the leg or a history of blood clot in the leg or lung. She denies history of cancer. She denies recent travel or surgery.  She has a history of DM II. Her PCP is [name]; she attempted to see him this morning but he currently is on vacation.    Past Medical History  Diagnosis Date  . Hypertension   . Diabetes mellitus   . Stroke     TIA's x 2   History reviewed. No pertinent past surgical history. History reviewed. No pertinent family history. History  Substance Use Topics  . Smoking status: Never Smoker   . Smokeless tobacco: Not on file  . Alcohol Use: No   OB History   Grav Para Term Preterm Abortions TAB SAB Ect Mult Living                 Review of Systems  Constitutional: Negative for fever and chills.  Cardiovascular: Positive for leg swelling.  Gastrointestinal: Negative for nausea, vomiting and diarrhea.  Musculoskeletal: Positive for arthralgias and myalgias.    Allergies  Review of patient's allergies indicates not on file.  Home  Medications   Prior to Admission medications   Medication Sig Start Date End Date Taking? Authorizing Provider  aspirin EC 81 MG tablet Take 81 mg by mouth at bedtime. As blood thinner/ for heart   Yes Historical Provider, MD  atorvastatin (LIPITOR) 10 MG tablet Take 10 mg by mouth every morning. For cholesterol   Yes Historical Provider, MD  buPROPion (WELLBUTRIN XL) 300 MG 24 hr tablet Take 300 mg by mouth every morning. For mood/depression or as directed by prescriber 01/20/13  Yes Historical Provider, MD  chlorthalidone (HYGROTON) 25 MG tablet Take 25 mg by mouth every morning. For high blood pressure/fluid 01/03/13  Yes Historical Provider, MD  citalopram (CELEXA) 20 MG tablet Take 20 mg by mouth daily. For mood/depression   Yes Historical Provider, MD  clopidogrel (PLAVIX) 75 MG tablet Take 75 mg by mouth daily. For heart/ blood thinner   Yes Historical Provider, MD  glimepiride (AMARYL) 2 MG tablet Take 2 mg by mouth daily with breakfast. For Diabetes   Yes Historical Provider, MD  losartan (COZAAR) 100 MG tablet Take 100 mg by mouth daily. For high blood pressure   Yes Historical Provider, MD  MAGNESIUM PO Take 1 tablet by mouth at bedtime. For leg cramps   Yes Historical Provider, MD  Multiple Vitamin (MULTIVITAMIN WITH MINERALS)  TABS tablet Take 1 tablet by mouth at bedtime. For supplement   Yes Historical Provider, MD  pantoprazole (PROTONIX) 40 MG tablet Take 40 mg by mouth daily. For acid reflux/gerd   Yes Historical Provider, MD   BP 127/56  Pulse 97  Temp(Src) 99.1 F (37.3 C) (Oral)  Resp 20  Ht 5\' 3"  (1.6 m)  Wt 202 lb (91.627 kg)  BMI 35.79 kg/m2  SpO2 100% Physical Exam  Nursing note and vitals reviewed. Constitutional: She is oriented to person, place, and time. She appears well-developed and well-nourished.  HENT:  Head: Normocephalic and atraumatic.  Neck: No tracheal deviation present.  Cardiovascular: Normal rate.   Normal pulses in feet bilaterallty   Pulmonary/Chest: Effort normal.  Musculoskeletal:  Mild swelling of the left leg compared to the right No erethema of the left lower extremity   Neurological: She is alert and oriented to person, place, and time.  Skin: Skin is warm and dry.  Psychiatric: She has a normal mood and affect. Her behavior is normal.    ED Course  Procedures   DIAGNOSTIC STUDIES: Oxygen Saturation is 100% on RA, normal by my interpretation.    COORDINATION OF CARE: 3:50 PM Discussed treatment plan with pt at bedside and pt agreed to plan.  Labs Review Labs Reviewed - No data to display  Imaging Review US Venous Img Lower Unilateral Left  11/29/2013   CLINICAL DATA:  Left lower extremity pain and swelling intermittently for several months. (Acute on chronic pain).  EXAM: LEFT LOWER EXTREMITY VENOUS DOPPLER ULTRASOUND  TECHNIQUE: Gray-scale sonography with graded compression, as well as color Doppler and duplex ultrasound were performed to evaluate the lower extremity deep venous systems from the level of the common femoral vein and including the common femoral, femoral, profunda femoral, popliteal and calf veins including the posterior tibial, peroneal and gastrocnemius veins when visible. The superficial great saphenous vein was also interrogated. Spectral Doppler was utilized to evaluate flow at rest and with distal augmentation maneuvers in the common femoral, femoral and popliteal veins.  COMPARISON:  None.  FINDINGS: Common Femoral Vein: No evidence of thrombus. Normal compressibility, respiratory phasicity and response to augmentation.  Saphenofemoral Junction: No evidence of thrombus. Normal compressibility and flow on color Doppler imaging.  Profunda Femoral Vein: No evidence of thrombus. Normal compressibility and flow on color Doppler imaging.  Femoral Vein: No evidence of thrombus. Normal compressibility, respiratory phasicity and response to augmentation.  Popliteal Vein: No evidence of thrombus.  Normal compressibility, respiratory phasicity and response to augmentation.  Calf Veins: No evidence of thrombus. Normal compressibility and flow on color Doppler imaging.  Superficial Great Saphenous Vein: No evidence of thrombus. Normal compressibility and flow on color Doppler imaging.  Venous Reflux:  None.  Other Findings:  None.  IMPRESSION: No evidence of deep venous thrombosis.   Electronically Signed   By: Daryll Brod M.D.   On: 11/29/2013 16:44  I personally reviewed the imaging tests through PACS system I reviewed available ER/hospitalization records through the EMR    EKG Interpretation None      MDM   Final diagnoses:  Left leg pain   Ultrasound negative for DVT.  Normal arterial pulses.  Discharge home in good condition.  followup with pcp.  I personally performed the services described in this documentation, which was scribed in my presence. The recorded information has been reviewed and is accurate.        Hoy Morn, MD 11/29/13 [phone]

## 2013-11-29 NOTE — ED Notes (Addendum)
Pain, swelling lt calf with pain in lt foot also,x 5 days  Dizzy

## 2014-02-08 ENCOUNTER — Ambulatory Visit: Payer: Medicare Other | Admitting: Urology

## 2014-03-24 ENCOUNTER — Encounter (INDEPENDENT_AMBULATORY_CARE_PROVIDER_SITE_OTHER): Payer: Self-pay

## 2014-03-24 ENCOUNTER — Encounter (INDEPENDENT_AMBULATORY_CARE_PROVIDER_SITE_OTHER): Payer: Self-pay | Admitting: *Deleted

## 2014-03-30 DIAGNOSIS — M10072 Idiopathic gout, left ankle and foot: Secondary | ICD-10-CM | POA: Diagnosis not present

## 2014-03-30 DIAGNOSIS — F419 Anxiety disorder, unspecified: Secondary | ICD-10-CM | POA: Diagnosis not present

## 2014-04-25 DIAGNOSIS — I6789 Other cerebrovascular disease: Secondary | ICD-10-CM | POA: Diagnosis not present

## 2014-04-25 DIAGNOSIS — E119 Type 2 diabetes mellitus without complications: Secondary | ICD-10-CM | POA: Diagnosis not present

## 2014-04-25 DIAGNOSIS — I10 Essential (primary) hypertension: Secondary | ICD-10-CM | POA: Diagnosis not present

## 2014-04-25 DIAGNOSIS — M109 Gout, unspecified: Secondary | ICD-10-CM | POA: Diagnosis not present

## 2014-04-25 DIAGNOSIS — E785 Hyperlipidemia, unspecified: Secondary | ICD-10-CM | POA: Diagnosis not present

## 2014-04-29 DIAGNOSIS — I10 Essential (primary) hypertension: Secondary | ICD-10-CM | POA: Diagnosis not present

## 2014-04-29 DIAGNOSIS — E785 Hyperlipidemia, unspecified: Secondary | ICD-10-CM | POA: Diagnosis not present

## 2014-04-29 DIAGNOSIS — Z23 Encounter for immunization: Secondary | ICD-10-CM | POA: Diagnosis not present

## 2014-04-29 DIAGNOSIS — N184 Chronic kidney disease, stage 4 (severe): Secondary | ICD-10-CM | POA: Diagnosis not present

## 2014-04-29 DIAGNOSIS — Z0001 Encounter for general adult medical examination with abnormal findings: Secondary | ICD-10-CM | POA: Diagnosis not present

## 2014-05-04 ENCOUNTER — Other Ambulatory Visit (HOSPITAL_COMMUNITY): Payer: Self-pay | Admitting: Internal Medicine

## 2014-05-04 DIAGNOSIS — Z1231 Encounter for screening mammogram for malignant neoplasm of breast: Secondary | ICD-10-CM

## 2014-05-09 ENCOUNTER — Ambulatory Visit (HOSPITAL_COMMUNITY): Payer: Medicare Other

## 2014-05-30 DIAGNOSIS — Z6835 Body mass index (BMI) 35.0-35.9, adult: Secondary | ICD-10-CM | POA: Diagnosis not present

## 2014-05-30 DIAGNOSIS — M109 Gout, unspecified: Secondary | ICD-10-CM | POA: Diagnosis not present

## 2014-06-09 DIAGNOSIS — M109 Gout, unspecified: Secondary | ICD-10-CM | POA: Diagnosis not present

## 2014-06-09 DIAGNOSIS — M1000 Idiopathic gout, unspecified site: Secondary | ICD-10-CM | POA: Diagnosis not present

## 2014-06-21 DIAGNOSIS — H10413 Chronic giant papillary conjunctivitis, bilateral: Secondary | ICD-10-CM | POA: Diagnosis not present

## 2014-06-21 DIAGNOSIS — E119 Type 2 diabetes mellitus without complications: Secondary | ICD-10-CM | POA: Diagnosis not present

## 2014-06-21 DIAGNOSIS — H26492 Other secondary cataract, left eye: Secondary | ICD-10-CM | POA: Diagnosis not present

## 2014-06-21 DIAGNOSIS — Z961 Presence of intraocular lens: Secondary | ICD-10-CM | POA: Diagnosis not present

## 2014-06-21 DIAGNOSIS — H3531 Nonexudative age-related macular degeneration: Secondary | ICD-10-CM | POA: Diagnosis not present

## 2014-08-09 DIAGNOSIS — M7521 Bicipital tendinitis, right shoulder: Secondary | ICD-10-CM | POA: Diagnosis not present

## 2014-08-12 ENCOUNTER — Ambulatory Visit (HOSPITAL_COMMUNITY)
Admission: RE | Admit: 2014-08-12 | Discharge: 2014-08-12 | Disposition: A | Discharge status: Home / Self Care | Payer: Medicare Other | Source: Ambulatory Visit | Attending: Internal Medicine | Admitting: Internal Medicine

## 2014-08-12 DIAGNOSIS — Z1231 Encounter for screening mammogram for malignant neoplasm of breast: Secondary | ICD-10-CM | POA: Insufficient documentation

## 2014-08-16 ENCOUNTER — Other Ambulatory Visit: Payer: Self-pay | Admitting: Internal Medicine

## 2014-08-16 DIAGNOSIS — R928 Other abnormal and inconclusive findings on diagnostic imaging of breast: Secondary | ICD-10-CM

## 2014-08-17 ENCOUNTER — Other Ambulatory Visit: Payer: Self-pay | Admitting: Internal Medicine

## 2014-08-17 DIAGNOSIS — R928 Other abnormal and inconclusive findings on diagnostic imaging of breast: Secondary | ICD-10-CM

## 2014-08-18 ENCOUNTER — Telehealth (INDEPENDENT_AMBULATORY_CARE_PROVIDER_SITE_OTHER): Payer: Self-pay | Admitting: *Deleted

## 2014-08-18 ENCOUNTER — Other Ambulatory Visit (INDEPENDENT_AMBULATORY_CARE_PROVIDER_SITE_OTHER): Payer: Self-pay | Admitting: *Deleted

## 2014-08-18 DIAGNOSIS — Z86010 Personal history of colonic polyps: Secondary | ICD-10-CM

## 2014-08-18 NOTE — Telephone Encounter (Signed)
Referring MD/PCP: fagan   Procedure: tcs  Reason/Indication:  Hx polyps  Has patient had this procedure before?  Yes, 2011 -- scanned  If so, when, by whom and where?    Is there a family history of colon cancer?  no  Who?  What age when diagnosed?    Is patient diabetic?   yes      Does patient have prosthetic heart valve?  no  Do you have a pacemaker?  no  Has patient ever had endocarditis? no  Has patient had joint replacement within last 12 months?  no  Does patient tend to be constipated or take laxatives? occassionally  Is patient on Coumadin, Plavix and/or Aspirin? yes  Medications: asa 81 mg daily, glimepiride 25 mg daily, venlafaxine 75 mg daily, chlorthalidone 25 mg daily, atorvastatin 20 mg daily, pantoprazole 40 mg daily, losartan 100 mg daily, pioglitazone 15 mg daily, victoza 18 mg daily, trazodone 100 mg daily  Allergies: nkda  Medication Adjustment: asa 2 days, hold DM meds evening before & morning of  Procedure date & time: 09/09/14 at 1010

## 2014-08-18 NOTE — Telephone Encounter (Signed)
Patient needs trilyte 

## 2014-08-22 ENCOUNTER — Ambulatory Visit
Admission: RE | Admit: 2014-08-22 | Discharge: 2014-08-22 | Disposition: A | Discharge status: Home / Self Care | Payer: Medicare Other | Source: Ambulatory Visit | Attending: Internal Medicine | Admitting: Internal Medicine

## 2014-08-22 DIAGNOSIS — R928 Other abnormal and inconclusive findings on diagnostic imaging of breast: Secondary | ICD-10-CM

## 2014-08-22 DIAGNOSIS — N63 Unspecified lump in breast: Secondary | ICD-10-CM | POA: Diagnosis not present

## 2014-08-22 MED ORDER — PEG 3350-KCL-NA BICARB-NACL 420 G PO SOLR: 4000.0000 mL | Freq: Once | ORAL | Status: DC

## 2014-08-22 NOTE — Telephone Encounter (Signed)
agree

## 2014-08-30 DIAGNOSIS — H60319 Diffuse otitis externa, unspecified ear: Secondary | ICD-10-CM | POA: Diagnosis not present

## 2014-09-09 ENCOUNTER — Ambulatory Visit (HOSPITAL_COMMUNITY)
Admission: RE | Admit: 2014-09-09 | Discharge: 2014-09-09 | Disposition: A | Discharge status: Home / Self Care | Payer: Medicare Other | Source: Ambulatory Visit | Attending: Internal Medicine | Admitting: Internal Medicine

## 2014-09-09 ENCOUNTER — Encounter (HOSPITAL_COMMUNITY): Payer: Self-pay | Admitting: *Deleted

## 2014-09-09 ENCOUNTER — Encounter (HOSPITAL_COMMUNITY)
Admission: RE | Disposition: A | Discharge status: Home / Self Care | Payer: Self-pay | Source: Ambulatory Visit | Attending: Internal Medicine

## 2014-09-09 DIAGNOSIS — K644 Residual hemorrhoidal skin tags: Secondary | ICD-10-CM | POA: Insufficient documentation

## 2014-09-09 DIAGNOSIS — E119 Type 2 diabetes mellitus without complications: Secondary | ICD-10-CM | POA: Diagnosis not present

## 2014-09-09 DIAGNOSIS — Z8673 Personal history of transient ischemic attack (TIA), and cerebral infarction without residual deficits: Secondary | ICD-10-CM | POA: Insufficient documentation

## 2014-09-09 DIAGNOSIS — K5730 Diverticulosis of large intestine without perforation or abscess without bleeding: Secondary | ICD-10-CM | POA: Insufficient documentation

## 2014-09-09 DIAGNOSIS — D120 Benign neoplasm of cecum: Secondary | ICD-10-CM | POA: Diagnosis not present

## 2014-09-09 DIAGNOSIS — Z7982 Long term (current) use of aspirin: Secondary | ICD-10-CM | POA: Diagnosis not present

## 2014-09-09 DIAGNOSIS — Z79899 Other long term (current) drug therapy: Secondary | ICD-10-CM | POA: Diagnosis not present

## 2014-09-09 DIAGNOSIS — K648 Other hemorrhoids: Secondary | ICD-10-CM | POA: Diagnosis not present

## 2014-09-09 DIAGNOSIS — I10 Essential (primary) hypertension: Secondary | ICD-10-CM | POA: Diagnosis not present

## 2014-09-09 DIAGNOSIS — Z86010 Personal history of colonic polyps: Secondary | ICD-10-CM | POA: Insufficient documentation

## 2014-09-09 DIAGNOSIS — Z09 Encounter for follow-up examination after completed treatment for conditions other than malignant neoplasm: Secondary | ICD-10-CM | POA: Diagnosis present

## 2014-09-09 HISTORY — PX: COLONOSCOPY: SHX5424

## 2014-09-09 LAB — GLUCOSE, CAPILLARY: Glucose-Capillary: 103 mg/dL — ABNORMAL HIGH (ref 65–99)

## 2014-09-09 SURGERY — COLONOSCOPY
Anesthesia: Moderate Sedation

## 2014-09-09 MED ORDER — MEPERIDINE HCL 50 MG/ML IJ SOLN
INTRAMUSCULAR | Status: AC
  Filled 2014-09-09: qty 1

## 2014-09-09 MED ORDER — SODIUM CHLORIDE 0.9 % IV SOLN
INTRAVENOUS | Status: DC
Start: 2014-09-09 — End: 2014-09-09
  Administered 2014-09-09: 07:00:00 via INTRAVENOUS

## 2014-09-09 MED ORDER — STERILE WATER FOR IRRIGATION IR SOLN
Status: DC | PRN
  Administered 2014-09-09: 08:00:00

## 2014-09-09 MED ORDER — MEPERIDINE HCL 50 MG/ML IJ SOLN
INTRAMUSCULAR | Status: DC | PRN
  Administered 2014-09-09 (×2): 25 mg via INTRAVENOUS

## 2014-09-09 MED ORDER — MIDAZOLAM HCL 5 MG/5ML IJ SOLN
INTRAMUSCULAR | Status: DC | PRN
  Administered 2014-09-09 (×2): 2 mg via INTRAVENOUS

## 2014-09-09 MED ORDER — MIDAZOLAM HCL 5 MG/5ML IJ SOLN
INTRAMUSCULAR | Status: AC
  Filled 2014-09-09: qty 10

## 2014-09-09 NOTE — Discharge Instructions (Signed)
Resume usual medications and high fiber diet. °No driving for 24 hours. °Physician will call with biopsy results ° ° °High-Fiber Diet °Fiber is found in fruits, vegetables, and grains. A high-fiber diet encourages the addition of more whole grains, legumes, fruits, and vegetables in your diet. The recommended amount of fiber for adult males is 38 g per day. For adult females, it is 25 g per day. Pregnant and lactating women should get 28 g of fiber per day. If you have a digestive or bowel problem, ask your caregiver for advice before adding high-fiber foods to your diet. Eat a variety of high-fiber foods instead of only a select few type of foods.  °PURPOSE °· To increase stool bulk. °· To make bowel movements more regular to prevent constipation. °· To lower cholesterol. °· To prevent overeating. °WHEN IS THIS DIET USED? °· It may be used if you have constipation and hemorrhoids. °· It may be used if you have uncomplicated diverticulosis (intestine condition) and irritable bowel syndrome. °· It may be used if you need help with weight management. °· It may be used if you want to add it to your diet as a protective measure against atherosclerosis, diabetes, and cancer. °SOURCES OF FIBER °· Whole-grain breads and cereals. °· Fruits, such as apples, oranges, bananas, berries, prunes, and pears. °· Vegetables, such as green peas, carrots, sweet potatoes, beets, broccoli, cabbage, spinach, and artichokes. °· Legumes, such split peas, soy, lentils. °· Almonds. °FIBER CONTENT IN FOODS °Starches and Grains / Dietary Fiber (g) °· Cheerios, 1 cup / 3 g °· Corn Flakes cereal, 1 cup / 0.7 g °· Rice crispy treat cereal, 1¼ cup / 0.3 g °· Instant oatmeal (cooked), ½ cup / 2 g °· Frosted wheat cereal, 1 cup / 5.1 g °· Brown, long-grain rice (cooked), 1 cup / 3.5 g °· White, long-grain rice (cooked), 1 cup / 0.6 g °· Enriched macaroni (cooked), 1 cup / 2.5 g °Legumes / Dietary Fiber (g) °· Baked beans (canned, plain, or  vegetarian), ½ cup / 5.2 g °· Kidney beans (canned), ½ cup / 6.8 g °· Pinto beans (cooked), ½ cup / 5.5 g °Breads and Crackers / Dietary Fiber (g) °· Plain or honey graham crackers, 2 squares / 0.7 g °· Saltine crackers, 3 squares / 0.3 g °· Plain, salted pretzels, 10 pieces / 1.8 g °· Whole-wheat bread, 1 slice / 1.9 g °· White bread, 1 slice / 0.7 g °· Raisin bread, 1 slice / 1.2 g °· Plain bagel, 3 oz / 2 g °· Flour tortilla, 1 oz / 0.9 g °· Corn tortilla, 1 small / 1.5 g °· Hamburger or hotdog bun, 1 small / 0.9 g °Fruits / Dietary Fiber (g) °· Apple with skin, 1 medium / 4.4 g °· Sweetened applesauce, ½ cup / 1.5 g °· Banana, ½ medium / 1.5 g °· Grapes, 10 grapes / 0.4 g °· Orange, 1 small / 2.3 g °· Raisin, 1.5 oz / 1.6 g °· Melon, 1 cup / 1.4 g °Vegetables / Dietary Fiber (g) °· Green beans (canned), ½ cup / 1.3 g °· Carrots (cooked), ½ cup / 2.3 g °· Broccoli (cooked), ½ cup / 2.8 g °· Peas (cooked), ½ cup / 4.4 g °· Mashed potatoes, ½ cup / 1.6 g °· Lettuce, 1 cup / 0.5 g °· Corn (canned), ½ cup / 1.6 g °· Tomato, ½ cup / 1.1 g °Document Released: 02/11/2005 Document Revised: 08/13/2011 Document Reviewed: 05/16/2011 °[employer]® Patient   Information 2015 [place], [place]. This information is not intended to replace advice given to you by your health care provider. Make sure you discuss any questions you have with your health care provider. Colonoscopy, Care After These instructions give you information on caring for yourself after your procedure. Your doctor may also give you more specific instructions. Call your doctor if you have any problems or questions after your procedure. HOME CARE  Do not drive for 24 hours.  Do not sign important papers or use machinery for 24 hours.  You may shower.  You may go back to your usual activities, but go slower for the first 24 hours.  Take rest breaks often during the first 24 hours.  Walk around or use warm packs on your belly (abdomen) if you have belly  cramping or gas.  Drink enough fluids to keep your pee (urine) clear or pale yellow.  Resume your normal diet. Avoid heavy or fried foods.  Avoid drinking alcohol for 24 hours or as told by your doctor.  Only take medicines as told by your doctor. If a tissue sample (biopsy) was taken during the procedure:   Do not take aspirin or blood thinners for 7 days, or as told by your doctor.  Do not drink alcohol for 7 days, or as told by your doctor.  Eat soft foods for the first 24 hours. GET HELP IF: You still have a small amount of blood in your poop (stool) 2-3 days after the procedure. GET HELP RIGHT AWAY IF:  You have more than a small amount of blood in your poop.  You see clumps of tissue (blood clots) in your poop.  Your belly is puffy (swollen).  You feel sick to your stomach (nauseous) or throw up (vomit).  You have a fever.  You have belly pain that gets worse and medicine does not help. MAKE SURE YOU:  Understand these instructions.  Will watch your condition.  Will get help right away if you are not doing well or get worse. Document Released: 03/16/2010 Document Revised: 02/16/2013 Document Reviewed: 10/19/2012 [facility] Patient Information 2015 [place], [place]. This information is not intended to replace advice given to you by your health care provider. Make sure you discuss any questions you have with your health care provider.

## 2014-09-09 NOTE — H&P (Signed)
Amy Wiggins is an 81 y.o. female.   Chief Complaint: Patient is here for colonoscopy. HPI: She is 81 year old Caucasian female who has history of colonic adenomas and is here for surveillance colonoscopy. Last exam was in February 2011 with removal of three polyps and two were tubular adenomas. She denies abdominal pain change in bowel habits or rectal bleeding. Family history is negative for CRC.  Past Medical History  Diagnosis Date  . Hypertension   . Diabetes mellitus   . Stroke     TIA's x 2    Past Surgical History  Procedure Laterality Date  . Colonoscopy      History reviewed. No pertinent family history. Social History:  reports that she has never smoked. She does not have any smokeless tobacco history on file. She reports that she does not drink alcohol or use illicit drugs.  Allergies: No Known Allergies  Medications Prior to Admission  Medication Sig Dispense Refill  . aspirin EC 81 MG tablet Take 81 mg by mouth at bedtime. As blood thinner/ for heart    . atorvastatin (LIPITOR) 20 MG tablet Take 20 mg by mouth daily.    . chlorthalidone (HYGROTON) 25 MG tablet Take 25 mg by mouth every morning. For high blood pressure/fluid    . glimepiride (AMARYL) 2 MG tablet Take 2 mg by mouth daily with breakfast. For Diabetes    . Liraglutide (VICTOZA) 18 MG/3ML SOPN Inject 18 mg into the skin daily.    Marland Kitchen losartan (COZAAR) 100 MG tablet Take 100 mg by mouth daily. For high blood pressure    . Multiple Vitamin (MULTIVITAMIN WITH MINERALS) TABS tablet Take 1 tablet by mouth at bedtime. For supplement    . pantoprazole (PROTONIX) 40 MG tablet Take 40 mg by mouth daily. For acid reflux/gerd    . pioglitazone (ACTOS) 15 MG tablet Take 15 mg by mouth daily.    . polyethylene glycol-electrolytes (NULYTELY/GOLYTELY) 420 G solution Take 4,000 mLs by mouth once. 4000 mL 0  . traZODone (DESYREL) 100 MG tablet Take 100 mg by mouth at bedtime.    Marland Kitchen venlafaxine (EFFEXOR) 75 MG tablet  Take 75 mg by mouth daily.      Results for orders placed or performed during the hospital encounter of 09/09/14 (from the past 48 hour(s))  Glucose, capillary     Status: Abnormal   Collection Time: 09/09/14  6:58 AM  Result Value Ref Range   Glucose-Capillary 103 (H) 65 - 99 mg/dL   No results found.  ROS  Blood pressure 143/78, pulse 97, temperature 98.2 F (36.8 C), temperature source Oral, resp. rate 16, height 5\' 3"  (1.6 m), weight 200 lb (90.719 kg), SpO2 94 %. Physical Exam  Constitutional: She appears well-developed and well-nourished.  HENT:  Mouth/Throat: Oropharynx is clear and moist.  Eyes: Conjunctivae are normal. No scleral icterus.  Neck: No thyromegaly present.  Cardiovascular: Normal rate and regular rhythm.   Murmur: faint systolic ejection murmur best heard at left lower sternal border. Respiratory: Effort normal and breath sounds normal.  GI: Soft. She exhibits no distension and no mass. There is no tenderness.  Musculoskeletal: She exhibits no edema.  Lymphadenopathy:    She has no cervical adenopathy.  Neurological: She is alert.  Skin: Skin is warm and dry.     Assessment/Plan History of colonic adenomas. Surveillance colonoscopy.  REHMAN,NAJEEB U 09/09/2014, 7:31 AM

## 2014-09-09 NOTE — Op Note (Signed)
COLONOSCOPY PROCEDURE REPORT  PATIENT:  Amy Wiggins  MR#:  [id number] Birthdate:  [birth year], 81 y.o., female Endoscopist:  Dr. Rogene Houston, MD Referred By:  Dr. Asencion Noble, MD Procedure Date: 09/09/2014  Procedure:   Colonoscopy  Indications:  Patient is 81 year old Caucasian female was history of colonic adenomas and is here for surveillance colonoscopy. She has no GI symptoms.  Informed Consent:  The procedure and risks were reviewed with the patient and informed consent was obtained.  Medications:  Demerol 50 mg IV Versed 4 mg IV  Description of procedure:  After a digital rectal exam was performed, that colonoscope was advanced from the anus through the rectum and colon to the area of the cecum, ileocecal valve and appendiceal orifice. The cecum was deeply intubated. These structures were well-seen and photographed for the record. From the level of the cecum and ileocecal valve, the scope was slowly and cautiously withdrawn. The mucosal surfaces were carefully surveyed utilizing scope tip to flexion to facilitate fold flattening as needed. The scope was pulled down into the rectum where a thorough exam including retroflexion was performed.  Findings:   Prep satisfactory. She had significant amount of thick liquid stool in proximal colon. 4 mm cecal polyp located next appendiceal orifice was cold snared. Scattered diverticula at sigmoid colon. Small hemorrhoids below the dentate line.    Therapeutic/Diagnostic Maneuvers Performed:  See above  Complications:  None  Cecal Withdrawal Time:  17 minutes  Impression: Examination performed to cecum. Small cecal polyp was cold snared. Mild sigmoid colon diverticulosis. Small external hemorrhoids.  Recommendations:  Standard instructions given. I will contact patient with biopsy results and further recommendations.  REHMAN,NAJEEB U  09/09/2014 8:16 AM  CC: Dr. Asencion Noble, MD & Dr. Rayne Du ref. provider found

## 2014-09-12 ENCOUNTER — Encounter (HOSPITAL_COMMUNITY): Payer: Self-pay | Admitting: Internal Medicine

## 2014-09-19 ENCOUNTER — Encounter (INDEPENDENT_AMBULATORY_CARE_PROVIDER_SITE_OTHER): Payer: Self-pay | Admitting: *Deleted

## 2014-10-28 ENCOUNTER — Other Ambulatory Visit: Payer: Self-pay | Admitting: Internal Medicine

## 2014-10-28 DIAGNOSIS — N6320 Unspecified lump in the left breast, unspecified quadrant: Secondary | ICD-10-CM

## 2014-11-22 DIAGNOSIS — Z79899 Other long term (current) drug therapy: Secondary | ICD-10-CM | POA: Diagnosis not present

## 2014-11-22 DIAGNOSIS — M109 Gout, unspecified: Secondary | ICD-10-CM | POA: Diagnosis not present

## 2014-11-22 DIAGNOSIS — E119 Type 2 diabetes mellitus without complications: Secondary | ICD-10-CM | POA: Diagnosis not present

## 2014-11-23 ENCOUNTER — Ambulatory Visit
Admission: RE | Admit: 2014-11-23 | Discharge: 2014-11-23 | Disposition: A | Discharge status: Home / Self Care | Payer: Medicare Other | Source: Ambulatory Visit | Attending: Internal Medicine | Admitting: Internal Medicine

## 2014-11-23 DIAGNOSIS — N63 Unspecified lump in breast: Secondary | ICD-10-CM | POA: Diagnosis not present

## 2014-11-23 DIAGNOSIS — N6320 Unspecified lump in the left breast, unspecified quadrant: Secondary | ICD-10-CM

## 2014-11-29 DIAGNOSIS — Z23 Encounter for immunization: Secondary | ICD-10-CM | POA: Diagnosis not present

## 2014-11-29 DIAGNOSIS — Z6837 Body mass index (BMI) 37.0-37.9, adult: Secondary | ICD-10-CM | POA: Diagnosis not present

## 2014-11-29 DIAGNOSIS — E1129 Type 2 diabetes mellitus with other diabetic kidney complication: Secondary | ICD-10-CM | POA: Diagnosis not present

## 2014-11-29 DIAGNOSIS — N184 Chronic kidney disease, stage 4 (severe): Secondary | ICD-10-CM | POA: Diagnosis not present

## 2014-11-29 DIAGNOSIS — M109 Gout, unspecified: Secondary | ICD-10-CM | POA: Diagnosis not present

## 2015-03-10 DIAGNOSIS — M1000 Idiopathic gout, unspecified site: Secondary | ICD-10-CM | POA: Diagnosis not present

## 2015-04-25 ENCOUNTER — Other Ambulatory Visit: Payer: Self-pay | Admitting: Internal Medicine

## 2015-04-25 DIAGNOSIS — N6320 Unspecified lump in the left breast, unspecified quadrant: Secondary | ICD-10-CM

## 2015-04-28 DIAGNOSIS — Z79899 Other long term (current) drug therapy: Secondary | ICD-10-CM | POA: Diagnosis not present

## 2015-04-28 DIAGNOSIS — M109 Gout, unspecified: Secondary | ICD-10-CM | POA: Diagnosis not present

## 2015-04-28 DIAGNOSIS — I10 Essential (primary) hypertension: Secondary | ICD-10-CM | POA: Diagnosis not present

## 2015-04-28 DIAGNOSIS — I6789 Other cerebrovascular disease: Secondary | ICD-10-CM | POA: Diagnosis not present

## 2015-04-28 DIAGNOSIS — E119 Type 2 diabetes mellitus without complications: Secondary | ICD-10-CM | POA: Diagnosis not present

## 2015-05-05 DIAGNOSIS — E1129 Type 2 diabetes mellitus with other diabetic kidney complication: Secondary | ICD-10-CM | POA: Diagnosis not present

## 2015-05-05 DIAGNOSIS — I10 Essential (primary) hypertension: Secondary | ICD-10-CM | POA: Diagnosis not present

## 2015-05-05 DIAGNOSIS — N184 Chronic kidney disease, stage 4 (severe): Secondary | ICD-10-CM | POA: Diagnosis not present

## 2015-05-05 DIAGNOSIS — Z0001 Encounter for general adult medical examination with abnormal findings: Secondary | ICD-10-CM | POA: Diagnosis not present

## 2015-05-16 ENCOUNTER — Other Ambulatory Visit (HOSPITAL_COMMUNITY): Payer: Self-pay | Admitting: Internal Medicine

## 2015-05-16 ENCOUNTER — Ambulatory Visit (HOSPITAL_COMMUNITY)
Admission: RE | Admit: 2015-05-16 | Discharge: 2015-05-16 | Disposition: A | Discharge status: Home / Self Care | Payer: Medicare Other | Source: Ambulatory Visit | Attending: Internal Medicine | Admitting: Internal Medicine

## 2015-05-16 DIAGNOSIS — S79911A Unspecified injury of right hip, initial encounter: Secondary | ICD-10-CM | POA: Diagnosis not present

## 2015-05-16 DIAGNOSIS — M25551 Pain in right hip: Secondary | ICD-10-CM

## 2015-05-19 DIAGNOSIS — M25551 Pain in right hip: Secondary | ICD-10-CM | POA: Diagnosis not present

## 2015-05-24 ENCOUNTER — Ambulatory Visit
Admission: RE | Admit: 2015-05-24 | Discharge: 2015-05-24 | Disposition: A | Discharge status: Home / Self Care | Payer: Medicare Other | Source: Ambulatory Visit | Attending: Internal Medicine | Admitting: Internal Medicine

## 2015-05-24 DIAGNOSIS — N6320 Unspecified lump in the left breast, unspecified quadrant: Secondary | ICD-10-CM

## 2015-05-24 DIAGNOSIS — N6489 Other specified disorders of breast: Secondary | ICD-10-CM | POA: Diagnosis not present

## 2015-05-24 DIAGNOSIS — R922 Inconclusive mammogram: Secondary | ICD-10-CM | POA: Diagnosis not present

## 2015-06-02 ENCOUNTER — Emergency Department (HOSPITAL_COMMUNITY): Payer: Medicare Other

## 2015-06-02 ENCOUNTER — Inpatient Hospital Stay (HOSPITAL_COMMUNITY): Payer: Medicare Other

## 2015-06-02 ENCOUNTER — Inpatient Hospital Stay (HOSPITAL_COMMUNITY)
Admission: EM | Admit: 2015-06-02 | Discharge: 2015-06-07 | DRG: 041 | Disposition: A | Discharge status: Home Health | Payer: Medicare Other | Attending: Internal Medicine | Admitting: Internal Medicine

## 2015-06-02 ENCOUNTER — Encounter (HOSPITAL_COMMUNITY): Payer: Self-pay | Admitting: *Deleted

## 2015-06-02 DIAGNOSIS — I129 Hypertensive chronic kidney disease with stage 1 through stage 4 chronic kidney disease, or unspecified chronic kidney disease: Secondary | ICD-10-CM | POA: Diagnosis present

## 2015-06-02 DIAGNOSIS — N184 Chronic kidney disease, stage 4 (severe): Secondary | ICD-10-CM | POA: Diagnosis present

## 2015-06-02 DIAGNOSIS — E876 Hypokalemia: Secondary | ICD-10-CM | POA: Diagnosis present

## 2015-06-02 DIAGNOSIS — E1159 Type 2 diabetes mellitus with other circulatory complications: Secondary | ICD-10-CM | POA: Diagnosis not present

## 2015-06-02 DIAGNOSIS — D72829 Elevated white blood cell count, unspecified: Secondary | ICD-10-CM | POA: Diagnosis not present

## 2015-06-02 DIAGNOSIS — Z7982 Long term (current) use of aspirin: Secondary | ICD-10-CM

## 2015-06-02 DIAGNOSIS — H5310 Unspecified subjective visual disturbances: Secondary | ICD-10-CM | POA: Diagnosis not present

## 2015-06-02 DIAGNOSIS — Z8673 Personal history of transient ischemic attack (TIA), and cerebral infarction without residual deficits: Secondary | ICD-10-CM | POA: Diagnosis not present

## 2015-06-02 DIAGNOSIS — I10 Essential (primary) hypertension: Secondary | ICD-10-CM | POA: Diagnosis present

## 2015-06-02 DIAGNOSIS — I340 Nonrheumatic mitral (valve) insufficiency: Secondary | ICD-10-CM | POA: Diagnosis not present

## 2015-06-02 DIAGNOSIS — I6340 Cerebral infarction due to embolism of unspecified cerebral artery: Principal | ICD-10-CM | POA: Diagnosis present

## 2015-06-02 DIAGNOSIS — H539 Unspecified visual disturbance: Secondary | ICD-10-CM | POA: Diagnosis not present

## 2015-06-02 DIAGNOSIS — Z79899 Other long term (current) drug therapy: Secondary | ICD-10-CM

## 2015-06-02 DIAGNOSIS — Z7952 Long term (current) use of systemic steroids: Secondary | ICD-10-CM | POA: Diagnosis not present

## 2015-06-02 DIAGNOSIS — Q211 Atrial septal defect: Secondary | ICD-10-CM | POA: Diagnosis not present

## 2015-06-02 DIAGNOSIS — Z6836 Body mass index (BMI) 36.0-36.9, adult: Secondary | ICD-10-CM

## 2015-06-02 DIAGNOSIS — H353131 Nonexudative age-related macular degeneration, bilateral, early dry stage: Secondary | ICD-10-CM | POA: Diagnosis not present

## 2015-06-02 DIAGNOSIS — E1122 Type 2 diabetes mellitus with diabetic chronic kidney disease: Secondary | ICD-10-CM | POA: Diagnosis present

## 2015-06-02 DIAGNOSIS — E785 Hyperlipidemia, unspecified: Secondary | ICD-10-CM | POA: Diagnosis not present

## 2015-06-02 DIAGNOSIS — N179 Acute kidney failure, unspecified: Secondary | ICD-10-CM | POA: Diagnosis not present

## 2015-06-02 DIAGNOSIS — I6789 Other cerebrovascular disease: Secondary | ICD-10-CM | POA: Diagnosis not present

## 2015-06-02 DIAGNOSIS — Q2112 Patent foramen ovale: Secondary | ICD-10-CM | POA: Insufficient documentation

## 2015-06-02 DIAGNOSIS — N178 Other acute kidney failure: Secondary | ICD-10-CM | POA: Diagnosis not present

## 2015-06-02 DIAGNOSIS — H547 Unspecified visual loss: Secondary | ICD-10-CM | POA: Diagnosis not present

## 2015-06-02 DIAGNOSIS — F22 Delusional disorders: Secondary | ICD-10-CM

## 2015-06-02 DIAGNOSIS — E119 Type 2 diabetes mellitus without complications: Secondary | ICD-10-CM

## 2015-06-02 DIAGNOSIS — I639 Cerebral infarction, unspecified: Secondary | ICD-10-CM | POA: Insufficient documentation

## 2015-06-02 LAB — I-STAT CHEM 8, ED
BUN: 36 mg/dL — ABNORMAL HIGH (ref 6–20)
CALCIUM ION: 1.3 mmol/L (ref 1.13–1.30)
CHLORIDE: 96 mmol/L — AB (ref 101–111)
Creatinine, Ser: 1.6 mg/dL — ABNORMAL HIGH (ref 0.44–1.00)
Glucose, Bld: 132 mg/dL — ABNORMAL HIGH (ref 65–99)
HCT: 47 % — ABNORMAL HIGH (ref 36.0–46.0)
HEMOGLOBIN: 16 g/dL — AB (ref 12.0–15.0)
Potassium: 3.5 mmol/L (ref 3.5–5.1)
SODIUM: 138 mmol/L (ref 135–145)
TCO2: 27 mmol/L (ref 0–100)

## 2015-06-02 LAB — COMPREHENSIVE METABOLIC PANEL
ALBUMIN: 3.7 g/dL (ref 3.5–5.0)
ALT: 20 U/L (ref 14–54)
ANION GAP: 12 (ref 5–15)
AST: 24 U/L (ref 15–41)
Alkaline Phosphatase: 80 U/L (ref 38–126)
BILIRUBIN TOTAL: 0.5 mg/dL (ref 0.3–1.2)
BUN: 34 mg/dL — ABNORMAL HIGH (ref 6–20)
CO2: 28 mmol/L (ref 22–32)
Calcium: 10.7 mg/dL — ABNORMAL HIGH (ref 8.9–10.3)
Chloride: 99 mmol/L — ABNORMAL LOW (ref 101–111)
Creatinine, Ser: 1.76 mg/dL — ABNORMAL HIGH (ref 0.44–1.00)
GFR calc Af Amer: 30 mL/min — ABNORMAL LOW (ref >60)
GFR calc non Af Amer: 26 mL/min — ABNORMAL LOW (ref >60)
GLUCOSE: 134 mg/dL — AB (ref 65–99)
POTASSIUM: 3.6 mmol/L (ref 3.5–5.1)
Sodium: 139 mmol/L (ref 135–145)
TOTAL PROTEIN: 6.9 g/dL (ref 6.5–8.1)

## 2015-06-02 LAB — APTT: aPTT: 28 seconds (ref 24–37)

## 2015-06-02 LAB — I-STAT TROPONIN, ED: TROPONIN I, POC: 0.02 ng/mL (ref 0.00–0.08)

## 2015-06-02 LAB — GLUCOSE, CAPILLARY: GLUCOSE-CAPILLARY: 121 mg/dL — AB (ref 65–99)

## 2015-06-02 LAB — DIFFERENTIAL
Basophils Absolute: 0 10*3/uL (ref 0.0–0.1)
Basophils Relative: 0 %
EOS ABS: 0.5 10*3/uL (ref 0.0–0.7)
EOS PCT: 4 %
Lymphocytes Relative: 18 %
Lymphs Abs: 2.6 10*3/uL (ref 0.7–4.0)
Monocytes Absolute: 1.2 10*3/uL — ABNORMAL HIGH (ref 0.1–1.0)
Monocytes Relative: 9 %
NEUTROS PCT: 69 %
Neutro Abs: 9.6 10*3/uL — ABNORMAL HIGH (ref 1.7–7.7)

## 2015-06-02 LAB — CBC
HCT: 43.2 % (ref 36.0–46.0)
HEMOGLOBIN: 13.7 g/dL (ref 12.0–15.0)
MCH: 28.7 pg (ref 26.0–34.0)
MCHC: 31.7 g/dL (ref 30.0–36.0)
MCV: 90.6 fL (ref 78.0–100.0)
PLATELETS: 223 10*3/uL (ref 150–400)
RBC: 4.77 MIL/uL (ref 3.87–5.11)
RDW: 13.6 % (ref 11.5–15.5)
WBC: 13.9 10*3/uL — ABNORMAL HIGH (ref 4.0–10.5)

## 2015-06-02 LAB — PROTIME-INR
INR: 0.88 (ref 0.00–1.49)
PROTHROMBIN TIME: 12.1 s (ref 11.6–15.2)

## 2015-06-02 LAB — CBG MONITORING, ED: GLUCOSE-CAPILLARY: 130 mg/dL — AB (ref 65–99)

## 2015-06-02 MED ORDER — ASPIRIN EC 81 MG PO TBEC
81.0000 mg | DELAYED_RELEASE_TABLET | Freq: Every day | ORAL | Status: DC
  Administered 2015-06-02: 81 mg via ORAL
  Filled 2015-06-02: qty 1

## 2015-06-02 MED ORDER — SODIUM CHLORIDE 0.9% FLUSH
3.0000 mL | Freq: Two times a day (BID) | INTRAVENOUS | Status: DC
  Administered 2015-06-02 – 2015-06-05 (×7): 3 mL via INTRAVENOUS

## 2015-06-02 MED ORDER — KETOROLAC TROMETHAMINE 15 MG/ML IJ SOLN
15.0000 mg | Freq: Once | INTRAMUSCULAR | Status: AC
  Administered 2015-06-03: 15 mg via INTRAVENOUS
  Filled 2015-06-02: qty 1

## 2015-06-02 MED ORDER — ENOXAPARIN SODIUM 40 MG/0.4ML ~~LOC~~ SOLN
40.0000 mg | SUBCUTANEOUS | Status: DC
Start: 2015-06-02 — End: 2015-06-03
  Administered 2015-06-02: 40 mg via SUBCUTANEOUS
  Filled 2015-06-02: qty 0.4

## 2015-06-02 MED ORDER — ADULT MULTIVITAMIN W/MINERALS CH
1.0000 | ORAL_TABLET | Freq: Every day | ORAL | Status: DC
  Administered 2015-06-03 – 2015-06-06 (×4): 1 via ORAL
  Filled 2015-06-02 (×4): qty 1

## 2015-06-02 MED ORDER — LIRAGLUTIDE 18 MG/3ML ~~LOC~~ SOPN
1.8000 mg | PEN_INJECTOR | Freq: Every day | SUBCUTANEOUS | Status: DC
  Administered 2015-06-03 – 2015-06-05 (×3): 1.8 mg via SUBCUTANEOUS

## 2015-06-02 MED ORDER — PANTOPRAZOLE SODIUM 40 MG PO TBEC
40.0000 mg | DELAYED_RELEASE_TABLET | Freq: Every day | ORAL | Status: DC
  Administered 2015-06-03 – 2015-06-07 (×5): 40 mg via ORAL
  Filled 2015-06-02 (×5): qty 1

## 2015-06-02 MED ORDER — SENNOSIDES-DOCUSATE SODIUM 8.6-50 MG PO TABS: 1.0000 | ORAL_TABLET | Freq: Every evening | ORAL | Status: DC | PRN

## 2015-06-02 MED ORDER — LOSARTAN POTASSIUM 50 MG PO TABS
100.0000 mg | ORAL_TABLET | Freq: Every day | ORAL | Status: DC
  Administered 2015-06-03: 100 mg via ORAL
  Filled 2015-06-02: qty 2

## 2015-06-02 MED ORDER — TRAZODONE HCL 100 MG PO TABS
100.0000 mg | ORAL_TABLET | Freq: Every day | ORAL | Status: DC
  Administered 2015-06-02 – 2015-06-06 (×5): 100 mg via ORAL
  Filled 2015-06-02 (×5): qty 1

## 2015-06-02 MED ORDER — STROKE: EARLY STAGES OF RECOVERY BOOK
Freq: Once | Status: AC
  Administered 2015-06-05: 09:00:00
  Filled 2015-06-02: qty 1

## 2015-06-02 MED ORDER — ATORVASTATIN CALCIUM 10 MG PO TABS
20.0000 mg | ORAL_TABLET | Freq: Every day | ORAL | Status: DC
  Administered 2015-06-03: 20 mg via ORAL
  Filled 2015-06-02: qty 2

## 2015-06-02 MED ORDER — GLIMEPIRIDE 2 MG PO TABS
2.0000 mg | ORAL_TABLET | Freq: Every day | ORAL | Status: DC
  Administered 2015-06-03: 2 mg via ORAL
  Filled 2015-06-02: qty 1

## 2015-06-02 MED ORDER — VENLAFAXINE HCL ER 75 MG PO CP24
75.0000 mg | ORAL_CAPSULE | Freq: Every day | ORAL | Status: DC
  Administered 2015-06-03 – 2015-06-07 (×5): 75 mg via ORAL
  Filled 2015-06-02 (×5): qty 1

## 2015-06-02 MED ORDER — CHLORTHALIDONE 25 MG PO TABS
25.0000 mg | ORAL_TABLET | Freq: Every morning | ORAL | Status: DC
  Administered 2015-06-03: 25 mg via ORAL
  Filled 2015-06-02: qty 1

## 2015-06-02 MED ORDER — ASPIRIN 325 MG PO TABS: 325.0000 mg | ORAL_TABLET | Freq: Once | ORAL | Status: DC

## 2015-06-02 MED ORDER — LOSARTAN POTASSIUM 50 MG PO TABS
100.0000 mg | ORAL_TABLET | Freq: Once | ORAL | Status: DC
  Filled 2015-06-02: qty 2

## 2015-06-02 MED ORDER — ACETAMINOPHEN 500 MG PO TABS
1000.0000 mg | ORAL_TABLET | Freq: Every day | ORAL | Status: DC
  Administered 2015-06-02 – 2015-06-06 (×5): 1000 mg via ORAL
  Filled 2015-06-02 (×5): qty 2

## 2015-06-02 MED ORDER — PIOGLITAZONE HCL 15 MG PO TABS
15.0000 mg | ORAL_TABLET | Freq: Every day | ORAL | Status: DC
  Administered 2015-06-03 – 2015-06-05 (×3): 15 mg via ORAL
  Filled 2015-06-02 (×4): qty 1

## 2015-06-02 MED ORDER — CLOPIDOGREL BISULFATE 75 MG PO TABS
75.0000 mg | ORAL_TABLET | Freq: Once | ORAL | Status: DC
  Filled 2015-06-02: qty 1

## 2015-06-02 NOTE — ED Provider Notes (Signed)
CSN: [id number]     Arrival date & time 06/02/15  1500 History   First MD Initiated Contact with Patient 06/02/15 1738     Chief Complaint  Patient presents with  . Eye Problem     (Consider location/radiation/quality/duration/timing/severity/associated sxs/prior Treatment) HPI Patient was watching television Wednesday evening which is 2 nights ago now. Reports her vision became blurry and stayed that way. No associated other neurologic deficit. She reports she is also developed a right sided headache. No associated nausea vomiting or confusion. Patient went to see her ophthalmologist today who did complete examination. He was referred to the department for stroke evaluation. Patient has history of TIA. He denies she's been ill recently no fever chills or general illness. Past Medical History  Diagnosis Date  . Hypertension   . Diabetes mellitus   . Stroke ([place])     TIA's x 2   Past Surgical History  Procedure Laterality Date  . Colonoscopy    . Colonoscopy N/A 09/09/2014    Procedure: COLONOSCOPY;  Surgeon: Rogene Houston, MD;  Location: [facility];  Service: Endoscopy;  Laterality: N/A;  1010 - moved to 7:30 - Ann to notify   History reviewed. No pertinent family history. Social History  Substance Use Topics  . Smoking status: Never Smoker   . Smokeless tobacco: None  . Alcohol Use: No   OB History    No data available     Review of Systems 10 Systems reviewed and are negative for acute change except as noted in the HPI.    Allergies  Review of patient's allergies indicates no known allergies.  Home Medications   Prior to Admission medications   Medication Sig Start Date End Date Taking? Authorizing Provider  acetaminophen (TYLENOL) 500 MG tablet Take 1,000 mg by mouth at bedtime.   Yes Historical Provider, MD  aspirin EC 81 MG tablet Take 81 mg by mouth at bedtime. As blood thinner/ for heart   Yes Historical Provider, MD  atorvastatin (LIPITOR) 20 MG tablet  Take 20 mg by mouth daily.   Yes Historical Provider, MD  chlorthalidone (HYGROTON) 25 MG tablet Take 25 mg by mouth every morning. For high blood pressure/fluid 01/03/13  Yes Historical Provider, MD  glimepiride (AMARYL) 2 MG tablet Take 2 mg by mouth daily with breakfast. For Diabetes   Yes Historical Provider, MD  Liraglutide (VICTOZA) 18 MG/3ML SOPN Inject 1.8 mg into the skin at bedtime.    Yes Historical Provider, MD  losartan (COZAAR) 100 MG tablet Take 100 mg by mouth daily. For high blood pressure   Yes Historical Provider, MD  Multiple Vitamin (MULTIVITAMIN WITH MINERALS) TABS tablet Take 1 tablet by mouth at bedtime. For supplement   Yes Historical Provider, MD  NON FORMULARY Take 1 tablet by mouth at bedtime. "leg cramp relief med"   Yes Historical Provider, MD  pantoprazole (PROTONIX) 40 MG tablet Take 40 mg by mouth daily. For acid reflux/gerd   Yes Historical Provider, MD  pioglitazone (ACTOS) 15 MG tablet Take 15 mg by mouth daily.   Yes Historical Provider, MD  traZODone (DESYREL) 100 MG tablet Take 100 mg by mouth at bedtime.   Yes Historical Provider, MD  venlafaxine XR (EFFEXOR-XR) 75 MG 24 hr capsule Take 75 mg by mouth daily with breakfast.   Yes Historical Provider, MD  predniSONE (DELTASONE) 10 MG tablet Take 10-50 mg by mouth daily. Reported on 06/02/2015 05/19/15   Historical Provider, MD   BP 145/63 mmHg  Pulse 92  Temp(Src) 99.1 F (37.3 C) (Oral)  Resp 18  Ht 5\' 3"  (1.6 m)  Wt 206 lb (93.441 kg)  BMI 36.50 kg/m2  SpO2 98% Physical Exam  Constitutional: She is oriented to person, place, and time. She appears well-developed and well-nourished.  HENT:  Head: Normocephalic and atraumatic.  Right Ear: External ear normal.  Left Ear: External ear normal.  Nose: Nose normal.  Mouth/Throat: Oropharynx is clear and moist.  Bilateral TMs normal no erythema or bulging.  Eyes: EOM are normal. Pupils are equal, round, and reactive to light.  Pupils are bilaterally dilated.  Bilateral cataract repairs. Extraocular motions intact.  Neck: Neck supple.  Cardiovascular: Normal rate, regular rhythm, normal heart sounds and intact distal pulses.   Pulmonary/Chest: Effort normal and breath sounds normal.  Abdominal: Soft. Bowel sounds are normal. She exhibits no distension. There is no tenderness.  Musculoskeletal: Normal range of motion. She exhibits no edema or tenderness.  Neurological: She is alert and oriented to person, place, and time. She has normal strength. No cranial nerve deficit. She exhibits normal muscle tone. Coordination normal. GCS eye subscore is 4. GCS verbal subscore is 5. GCS motor subscore is 6.  Upper extremity strength 5 out of 5. Lower strength strength 5 out of 5. No sensory deficit to light touch. Cognitive function normal. Speech clear.  Skin: Skin is warm, dry and intact.  Psychiatric: She has a normal mood and affect.    ED Course  Procedures (including critical care time) Labs Review Labs Reviewed  CBC - Abnormal; Notable for the following:    WBC 13.9 (*)    All other components within normal limits  DIFFERENTIAL - Abnormal; Notable for the following:    Neutro Abs 9.6 (*)    Monocytes Absolute 1.2 (*)    All other components within normal limits  COMPREHENSIVE METABOLIC PANEL - Abnormal; Notable for the following:    Chloride 99 (*)    Glucose, Bld 134 (*)    BUN 34 (*)    Creatinine, Ser 1.76 (*)    Calcium 10.7 (*)    GFR calc non Af Amer 26 (*)    GFR calc Af Amer 30 (*)    All other components within normal limits  COMPREHENSIVE METABOLIC PANEL - Abnormal; Notable for the following:    Potassium 3.2 (*)    Chloride 99 (*)    Glucose, Bld 176 (*)    BUN 33 (*)    Creatinine, Ser 1.86 (*)    Total Protein 5.7 (*)    Albumin 3.0 (*)    GFR calc non Af Amer 24 (*)    GFR calc Af Amer 28 (*)    All other components within normal limits  GLUCOSE, CAPILLARY - Abnormal; Notable for the following:    Glucose-Capillary 121  (*)    All other components within normal limits  LIPID PANEL - Abnormal; Notable for the following:    Triglycerides 373 (*)    VLDL 75 (*)    All other components within normal limits  GLUCOSE, CAPILLARY - Abnormal; Notable for the following:    Glucose-Capillary 142 (*)    All other components within normal limits  BASIC METABOLIC PANEL - Abnormal; Notable for the following:    Glucose, Bld 110 (*)    BUN 45 (*)    Creatinine, Ser 2.16 (*)    GFR calc non Af Amer 20 (*)    GFR calc Af Amer 23 (*)  All other components within normal limits  CBG MONITORING, ED - Abnormal; Notable for the following:    Glucose-Capillary 130 (*)    All other components within normal limits  I-STAT CHEM 8, ED - Abnormal; Notable for the following:    Chloride 96 (*)    BUN 36 (*)    Creatinine, Ser 1.60 (*)    Glucose, Bld 132 (*)    Hemoglobin 16.0 (*)    HCT 47.0 (*)    All other components within normal limits  PROTIME-INR  APTT  CBC WITH DIFFERENTIAL/PLATELET  CBC  HEMOGLOBIN A1C  CBC  BASIC METABOLIC PANEL  I-STAT TROPOININ, ED    Imaging Review No results found. I have personally reviewed and evaluated these images and lab results as part of my medical decision-making.   EKG Interpretation   Date/Time:  Friday June 02 2015 15:10:57 EDT Ventricular Rate:  102 PR Interval:  184 QRS Duration: 88 QT Interval:  354 QTC Calculation: 461 R Axis:   -40 Text Interpretation:  Sinus tachycardia with Premature atrial complexes  Left axis deviation Possible Inferior infarct , age undetermined Abnormal  ECG Confirmed by Johnney Killian, MD, Jeannie Done [id number]) on 06/02/2015 6:16:25 PM     Consult: (18:10)  Dr. Silverio Decamp advises for initiation of Plavix. Advises for admission to hospitalist service. MRI MRA to be obtained. MDM   Final diagnoses:  Cerebral infarction due to unspecified mechanism   Patient will be admitted with CVA that occurred approximately 2 days ago. Neurology has been consult  it and advises to initiate Plavix and admitted to the hospitalist service. Patient is alert and appropriate. She has no respiratory distress, confusion or general ill appearance.    Charlesetta Shanks, MD 06/05/15 [phone]

## 2015-06-02 NOTE — H&P (Signed)
[facility] History and Physical  Amy Wiggins [id number] DOB: [birth year] DOA: 06/02/2015  Referring physician: Charlesetta Shanks, M.D. PCP: Asencion Noble, MD   Chief Complaint: Vision changes.  HPI: Amy Wiggins is a 82 y.o. female with a past medical history of hypertension, diabetes, TIA 2 constant emergency department with complaints of vision changes since Wednesday 9 when she was watching TV. She describes this as persistent blurry vision. She denies facial droop, motor or sensorial deficits, speech or language comprehension difficulty. She complains of headache. Imaging of the brain showed right occipital CVA.    Review of Systems:  Constitutional:  No weight loss, night sweats, Fevers, chills, fatigue.  HEENT:  No headaches, Difficulty swallowing,Tooth/dental problems,Sore throat,  No sneezing, itching, ear ache, nasal congestion, post nasal drip,  Cardio-vascular:  No chest pain, Orthopnea, PND, swelling in lower extremities, anasarca, dizziness, palpitations  GI:  No heartburn, indigestion, abdominal pain, nausea, vomiting, diarrhea, change in bowel habits, loss of appetite  Resp:  No shortness of breath with exertion or at rest. No excess mucus, no productive cough, No non-productive cough, No coughing up of blood.No change in color of mucus.No wheezing.No chest wall deformity  Skin:  no rash or lesions.  GU:  no dysuria, change in color of urine, no urgency or frequency. No flank pain.  Musculoskeletal:  No joint pain or swelling. No decreased range of motion. No back pain.  Psych:  No change in mood or affect. No depression or anxiety. No memory loss.   Past Medical History  Diagnosis Date  . Hypertension   . Diabetes mellitus   . Stroke ([place])     TIA's x 2   Past Surgical History  Procedure Laterality Date  . Colonoscopy    . Colonoscopy N/A 09/09/2014    Procedure: COLONOSCOPY;  Surgeon: Rogene Houston, MD;  Location[facility];   Service: Endoscopy;  Laterality: N/A;  1010 - moved to 7:30 - Ann to notify   Social History:  reports that she has never smoked. She does not have any smokeless tobacco history on file. She reports that she does not drink alcohol or use illicit drugs.  No Known Allergies  History reviewed. No pertinent family history.   Prior to Admission medications   Medication Sig Start Date End Date Taking? Authorizing Provider  acetaminophen (TYLENOL) 500 MG tablet Take 1,000 mg by mouth at bedtime.   Yes Historical Provider, MD  aspirin EC 81 MG tablet Take 81 mg by mouth at bedtime. As blood thinner/ for heart   Yes Historical Provider, MD  atorvastatin (LIPITOR) 20 MG tablet Take 20 mg by mouth daily.   Yes Historical Provider, MD  chlorthalidone (HYGROTON) 25 MG tablet Take 25 mg by mouth every morning. For high blood pressure/fluid 01/03/13  Yes Historical Provider, MD  glimepiride (AMARYL) 2 MG tablet Take 2 mg by mouth daily with breakfast. For Diabetes   Yes Historical Provider, MD  Liraglutide (VICTOZA) 18 MG/3ML SOPN Inject 1.8 mg into the skin at bedtime.    Yes Historical Provider, MD  losartan (COZAAR) 100 MG tablet Take 100 mg by mouth daily. For high blood pressure   Yes Historical Provider, MD  Multiple Vitamin (MULTIVITAMIN WITH MINERALS) TABS tablet Take 1 tablet by mouth at bedtime. For supplement   Yes Historical Provider, MD  NON FORMULARY Take 1 tablet by mouth at bedtime. "leg cramp relief med"   Yes Historical Provider, MD  pantoprazole (PROTONIX) 40 MG tablet Take 40  mg by mouth daily. For acid reflux/gerd   Yes Historical Provider, MD  pioglitazone (ACTOS) 15 MG tablet Take 15 mg by mouth daily.   Yes Historical Provider, MD  traZODone (DESYREL) 100 MG tablet Take 100 mg by mouth at bedtime.   Yes Historical Provider, MD  venlafaxine XR (EFFEXOR-XR) 75 MG 24 hr capsule Take 75 mg by mouth daily with breakfast.   Yes Historical Provider, MD  predniSONE (DELTASONE) 10 MG tablet  Take 10-50 mg by mouth daily. Reported on 06/02/2015 05/19/15   Historical Provider, MD   Physical Exam: Filed Vitals:   06/02/15 1730 06/02/15 1745 06/02/15 1815 06/02/15 1845  BP: 151/72  158/95 156/77  Pulse: 102 100 98 95  Temp:      TempSrc:      Resp: 15 20 18 13   Weight:      SpO2: 96% 97% 98% 97%    Wt Readings from Last 3 Encounters:  06/02/15 94.575 kg (208 lb 8 oz)  09/09/14 90.719 kg (200 lb)  11/29/13 91.627 kg (202 lb)    General:  Appears calm and comfortable Eyes: PERRL, Positive mydriasis, normal lids, irises & conjunctiva ENT: grossly normal hearing, lips & tongue Neck: no LAD, masses or thyromegaly Cardiovascular: RRR, no m/r/g. No LE edema. Telemetry: SR, no arrhythmias  Respiratory: CTA bilaterally, no w/r/r. Normal respiratory effort. Abdomen: soft, ntnd Skin: no rash or induration seen on limited exam Musculoskeletal: grossly normal tone BUE/BLE Psychiatric: grossly normal mood and affect, speech fluent and appropriate Neurologic: Awake, alert, oriented 4, the patient describes blurred vision, but otherwise grossly non-focal. no pronator drift, nose to finger and Romberg tests were negative.           Labs on Admission:  Basic Metabolic Panel:  Recent Labs Lab 06/02/15 1510 06/02/15 1522  NA 139 138  K 3.6 3.5  CL 99* 96*  CO2 28  --   GLUCOSE 134* 132*  BUN 34* 36*  CREATININE 1.76* 1.60*  CALCIUM 10.7*  --    Liver Function Tests:  Recent Labs Lab 06/02/15 1510  AST 24  ALT 20  ALKPHOS 80  BILITOT 0.5  PROT 6.9  ALBUMIN 3.7   CBC:  Recent Labs Lab 06/02/15 1510 06/02/15 1522  WBC 13.9*  --   NEUTROABS 9.6*  --   HGB 13.7 16.0*  HCT 43.2 47.0*  MCV 90.6  --   PLT 223  --     CBG:  Recent Labs Lab 06/02/15 1518  GLUCAP 130*    Radiological Exams on Admission: Ct Head Wo Contrast  06/02/2015  CLINICAL DATA:  82 year old presenting with blurred vision and vertigo which began last night. Current history of  hypertension and diabetes. Prior stroke. EXAM: CT HEAD WITHOUT CONTRAST TECHNIQUE: Contiguous axial images were obtained from the base of the skull through the vertex without intravenous contrast. COMPARISON:  CT head 08/15/2012, 12/12/2010 and MRI brain 12/12/2010, 10/18/2009. FINDINGS: Geographic low attenuation involving the cortex and white matter of the right occipital lobe. Remote prior stroke in the subcortical white matter of the right posterior parietal lobe. Moderate changes of small vessel disease of the white matter diffusely. Ventricular system normal in size and appearance for age. No mass lesion. No midline shift. No acute hemorrhage or hematoma. No extra-axial fluid collections. Mild changes of hyperostosis frontalis interna. No focal osseous abnormality involving the skull. Tiny mucous retention cyst or polyp in the left maxillary sinus. Remaining visualized paranasal sinuses, bilateral mastoid air cells and bilateral  middle ear cavities well-aerated. Extensive bilateral carotid siphon and vertebral artery atherosclerosis. IMPRESSION: 1. Acute nonhemorrhagic stroke involving the right occipital lobe. 2. Remote stroke involving the subcortical white matter of the right posterior parietal lobe as noted on the MRI in October, 2012. I telephoned these results at the time of interpretation on 06/02/2015 at 4:34 pm to [name], a nurse in the emergency department triage, who verbally acknowledged these results. Electronically Signed   By: Evangeline Dakin M.D.   On: 06/02/2015 16:36    EKG: Independently reviewed. Vent. rate 102 BPM PR interval 184 ms QRS duration 88 ms QT/QTc 354/461 ms P-R-T axes 70 -40 35 Sinus tachycardia with Premature atrial complexes Left axis deviation Possible Inferior infarct , age undetermined Abnormal ECG  Assessment/Plan Principal Problem:   Acute CVA (cerebrovascular accident) ([place]) Admit to telemetry. Frequent neuro checks. Check echocardiogram and carotid  Dopplers.  Check hemoglobin A1c and fasting lipid profile..  Active Problems:   Hypertension Continue losartan and chlorthalidone. Blood pressure monitoring.    Hyperlipidemia Continue atorvastatin 20 mg by mouth daily. Follow-up LFTs periodically.    CKD (chronic kidney disease) stage 4, GFR 15-29 ml/min (HCC) Follow-up BUN/creatinine and electrolytes.    DM type 2 (diabetes mellitus, type 2) ([place]) I will hydrate her modified diet. Continue Victoza and Amaryl. Monitor CBG.    Leukocytosis Likely stress related since there is no obvious source of infection. Follow WBC in a.m.    Code Status: Full code. DVT Prophylaxis: Lovenox SQ. Family Communication: Her daughter was present in the room. Disposition Plan: Admit for further evaluation and treatment.  Time spent: Over 70 minutes were spent in the process admission.  Reubin Milan, M.D. [facility] Pager [phone].

## 2015-06-02 NOTE — ED Notes (Signed)
The pt has had a rt sided headache since yesterday also

## 2015-06-02 NOTE — ED Notes (Signed)
The pt has no weakness of extremities no speech  Difficulty  No facial droop alert oriented skin warm and d ry  No distress

## 2015-06-02 NOTE — Consult Note (Signed)
Reason for consult: vision changes  HPI: 82 yo WF noticed acute vision changes 2 days ago while at home watching TV.  She waited to see if it would get better but came to the hospital today when it didn't and saw ophthalmology in the interim with no intrinsic eye issues.  She has a history of HTN, DM, high lipids and prior TIA involving vision.  She was on ASA daily when this happened. Plavix was added here.  CT Brain shows a hypodensity in the right occipital area which is confirmed to be an acute infarct on MRI.  MRA Brain and Neck are normal, including the PCA on the right.  Tele so far has shown NSR and she has no known history of A. Fib.  CBC negative except WBC 14k; no clear infection.  CMP normal except GFR 30.  T-I and coag negative.     PMH; as above PSH: as in hospitalist H&P  Meds: ASA/Plavix/Lipitor/Glimepiride/Liraglutide/Losartan/Actos/Trazodone/Effexor XR  SH/FH- as in hospitalist note  BP 146/94  P 101  T98    Neuro exam normal except left inferior homonymous quadrantonopia.    A/P:  Acute right occipital ischemic infarct.  The right PCA is patent, so it may have smaller branch local atherosclerotic thrombosis.  On the other hand, cardioembolism from paroxysmal atrial fibrillation is possible.  The combination of ASA and Plavix is no better than either agent alone for secondary stroke prevention and it increases the risk of hemorrhage greatly.  Therefore, I recommend stopping ASA and continuing Plavix monotherapy.  I recommend TTE and continued Telemetry too and if any cardioembolic source identified, it changes treatment to anticoagulation.  Risk factors for stroke should be modified accordingly. She may get better with time over the next 3 months, but the degree of improvement is difficult to predict.  There are computerized visual field rehab programs available to hasten recovery, but often not covered by insurance and very expensive.

## 2015-06-02 NOTE — ED Notes (Signed)
Pt still off unit for MRI

## 2015-06-02 NOTE — ED Notes (Signed)
Patient transported to MRI 

## 2015-06-02 NOTE — ED Notes (Signed)
The pt has had a film over both her eyes since yesterday  She saw her eye doc tor today and was sednt here.  Hx tias.  n

## 2015-06-03 ENCOUNTER — Inpatient Hospital Stay (HOSPITAL_COMMUNITY): Payer: Medicare Other

## 2015-06-03 DIAGNOSIS — N184 Chronic kidney disease, stage 4 (severe): Secondary | ICD-10-CM

## 2015-06-03 DIAGNOSIS — E1122 Type 2 diabetes mellitus with diabetic chronic kidney disease: Secondary | ICD-10-CM

## 2015-06-03 DIAGNOSIS — I6789 Other cerebrovascular disease: Secondary | ICD-10-CM

## 2015-06-03 DIAGNOSIS — I639 Cerebral infarction, unspecified: Secondary | ICD-10-CM | POA: Insufficient documentation

## 2015-06-03 DIAGNOSIS — I10 Essential (primary) hypertension: Secondary | ICD-10-CM

## 2015-06-03 LAB — COMPREHENSIVE METABOLIC PANEL
ALBUMIN: 3 g/dL — AB (ref 3.5–5.0)
ALK PHOS: 69 U/L (ref 38–126)
ALT: 17 U/L (ref 14–54)
ANION GAP: 11 (ref 5–15)
AST: 18 U/L (ref 15–41)
BILIRUBIN TOTAL: 0.5 mg/dL (ref 0.3–1.2)
BUN: 33 mg/dL — AB (ref 6–20)
CALCIUM: 9.6 mg/dL (ref 8.9–10.3)
CO2: 29 mmol/L (ref 22–32)
Chloride: 99 mmol/L — ABNORMAL LOW (ref 101–111)
Creatinine, Ser: 1.86 mg/dL — ABNORMAL HIGH (ref 0.44–1.00)
GFR calc Af Amer: 28 mL/min — ABNORMAL LOW (ref >60)
GFR calc non Af Amer: 24 mL/min — ABNORMAL LOW (ref >60)
GLUCOSE: 176 mg/dL — AB (ref 65–99)
Potassium: 3.2 mmol/L — ABNORMAL LOW (ref 3.5–5.1)
Sodium: 139 mmol/L (ref 135–145)
TOTAL PROTEIN: 5.7 g/dL — AB (ref 6.5–8.1)

## 2015-06-03 LAB — CBC WITH DIFFERENTIAL/PLATELET
BASOS ABS: 0 10*3/uL (ref 0.0–0.1)
BASOS PCT: 0 %
EOS ABS: 0.5 10*3/uL (ref 0.0–0.7)
EOS PCT: 5 %
HCT: 38.9 % (ref 36.0–46.0)
Hemoglobin: 12.2 g/dL (ref 12.0–15.0)
Lymphocytes Relative: 24 %
Lymphs Abs: 2.2 10*3/uL (ref 0.7–4.0)
MCH: 28.2 pg (ref 26.0–34.0)
MCHC: 31.4 g/dL (ref 30.0–36.0)
MCV: 90 fL (ref 78.0–100.0)
MONO ABS: 0.7 10*3/uL (ref 0.1–1.0)
Monocytes Relative: 8 %
Neutro Abs: 5.9 10*3/uL (ref 1.7–7.7)
Neutrophils Relative %: 63 %
PLATELETS: 209 10*3/uL (ref 150–400)
RBC: 4.32 MIL/uL (ref 3.87–5.11)
RDW: 13.6 % (ref 11.5–15.5)
WBC: 9.4 10*3/uL (ref 4.0–10.5)

## 2015-06-03 LAB — LIPID PANEL
Cholesterol: 198 mg/dL (ref 0–200)
HDL: 42 mg/dL (ref >40)
LDL Cholesterol: 81 mg/dL (ref 0–99)
TRIGLYCERIDES: 373 mg/dL — AB (ref <150)
Total CHOL/HDL Ratio: 4.7 RATIO
VLDL: 75 mg/dL — ABNORMAL HIGH (ref 0–40)

## 2015-06-03 LAB — GLUCOSE, CAPILLARY: Glucose-Capillary: 142 mg/dL — ABNORMAL HIGH (ref 65–99)

## 2015-06-03 MED ORDER — ENOXAPARIN SODIUM 30 MG/0.3ML ~~LOC~~ SOLN
30.0000 mg | SUBCUTANEOUS | Status: DC
  Administered 2015-06-03 – 2015-06-06 (×4): 30 mg via SUBCUTANEOUS
  Filled 2015-06-03 (×4): qty 0.3

## 2015-06-03 MED ORDER — POTASSIUM CHLORIDE CRYS ER 20 MEQ PO TBCR
20.0000 meq | EXTENDED_RELEASE_TABLET | Freq: Once | ORAL | Status: AC
  Administered 2015-06-03: 20 meq via ORAL
  Filled 2015-06-03: qty 1

## 2015-06-03 MED ORDER — ATORVASTATIN CALCIUM 40 MG PO TABS
40.0000 mg | ORAL_TABLET | Freq: Every day | ORAL | Status: DC
  Administered 2015-06-04 – 2015-06-06 (×3): 40 mg via ORAL
  Filled 2015-06-03 (×3): qty 1

## 2015-06-03 MED ORDER — SODIUM CHLORIDE 0.9 % IV SOLN: INTRAVENOUS | Status: AC

## 2015-06-03 NOTE — Evaluation (Signed)
Speech Language Pathology Evaluation Patient Details Name: Amy Wiggins MRN: [id number] DOB: 26-May-1931 Today's Date: 06/03/2015 Time: [id number] SLP Time Calculation (min) (ACUTE ONLY): 25 min  Problem List:  Patient Active Problem List   Diagnosis Date Noted  . Acute CVA (cerebrovascular accident) ([place]) 06/02/2015  . Hypertension 06/02/2015  . Hyperlipidemia 06/02/2015  . CKD (chronic kidney disease) stage 4, GFR 15-29 ml/min (HCC) 06/02/2015  . DM type 2 (diabetes mellitus, type 2) ([place]) 06/02/2015  . Leukocytosis 06/02/2015  . Muscle weakness (generalized) 09/16/2012  . Pain in joint, shoulder region 09/16/2012  . Closed displaced fracture of lateral end of clavicle 08/18/2012   Past Medical History:  Past Medical History  Diagnosis Date  . Hypertension   . Diabetes mellitus   . Stroke ([place])     TIA's x 2   Past Surgical History:  Past Surgical History  Procedure Laterality Date  . Colonoscopy    . Colonoscopy N/A 09/09/2014    Procedure: COLONOSCOPY;  Surgeon: Rogene Houston, MD;  [facility];  Service: Endoscopy;  Laterality: N/A;  1010 - moved to 7:30 - Ann to notify   HPI:  82 y.o. female with a past medical history of hypertension, diabetes, TIA 2 constant emergency department with complaints of vision changes since Wednesday 9 when she was watching TV. She describes this as persistent blurry vision. She denies facial droop, motor or sensorial deficits, speech or language comprehension difficulty. She complains of headache. Imaging of the brain showed right occipital CVA; pt denies any speech/language/cognitive deficits   Assessment / Plan / Recommendation Clinical Impression   Pt exhibits intelligible speech during complex conversational tasks; all auditory comprehension/expressive communicative tasks were within functional limits; cognition appears wfl  for tasks assessed; writing not assessed; but functional reading tasks were within normal limits  (i.e.: newspaper, environmental signs, menu, etc); ST not warranted at this time; pt aware of left visual changes since CVA.     SLP Assessment  Patient does not need any further Speech Language Pathology Services    Follow Up Recommendations  None    Frequency and Duration           SLP Evaluation Prior Functioning  Cognitive/Linguistic Baseline: Within functional limits Type of Home: House  Lives With: Other (Comment) (granddaughter) Available Help at Discharge: Family;Available PRN/intermittently Education: HS; some college courses Vocation: Retired   Associate Professor  Overall Cognitive Status: Within Functional Limits for tasks assessed Arousal/Alertness: Awake/alert Orientation Level: Oriented X4 Memory: Appears intact Awareness: Appears intact Problem Solving: Appears intact Safety/Judgment: Appears intact    Comprehension  Auditory Comprehension Overall Auditory Comprehension: Appears within functional limits for tasks assessed Yes/No Questions: Within Functional Limits Commands: Within Functional Limits Conversation: Complex Visual Recognition/Discrimination Discrimination: Within Function Limits Reading Comprehension Reading Status: Within funtional limits    Expression Expression Primary Mode of Expression: Verbal Verbal Expression Overall Verbal Expression: Appears within functional limits for tasks assessed Initiation: No impairment Level of Generative/Spontaneous Verbalization: Conversation Repetition: No impairment Naming: No impairment Pragmatics: No impairment Non-Verbal Means of Communication: Not applicable Written Expression Dominant Hand: Right Written Expression: Not tested   Oral / Motor  Oral Motor/Sensory Function Overall Oral Motor/Sensory Function: Within functional limits Motor Speech Overall Motor Speech: Appears within functional limits for tasks assessed Respiration: Within functional limits Phonation: Normal Resonance: Within  functional limits Articulation: Within functional limitis Intelligibility: Intelligible Motor Planning: Witnin functional limits Motor Speech Errors: Not applicable  Lajuana Patchell,PAT, M.S.,CCC-SLP 06/03/2015, 10:28 AM

## 2015-06-03 NOTE — Progress Notes (Signed)
Occupational Therapy Evaluation Patient Details Name: Amy Wiggins MRN: [id number] DOB: December 15, 1931 Today's Date: 06/03/2015    History of Present Illness Pt is an 82 y/o female who presents with visual changes. MRI revealed acute nonhemorrhagic infarct present in the medial right occipital lobe.   Clinical Impression   PTA, pt was independent with ADLs and mobility. Pt presents with 75-85 degree L visual field loss, dizziness, and mild balance impairments. Pt completed ADLs with set up assist and multiple verbal cues to locate items placed on L side. Educated pt on visual scanning strategies for safety and efficiency to complete daily tasks. Pt plans to d/c home with intermittent assistance from her granddaughter. Pt will benefit from continued acute OT to increase independence and safety with ADLs and mobility to allow for safe discharge home. Recommend neuro OP OT upon discharge for L visual changes.     Follow Up Recommendations  Outpatient OT    Equipment Recommendations  None recommended by OT    Recommendations for Other Services       Precautions / Restrictions Precautions Precautions: Fall Precaution Comments: L visual changes Restrictions Weight Bearing Restrictions: No      Mobility Bed Mobility Overal bed mobility: Modified Independent Bed Mobility: Supine to Sit;Sit to Supine     Supine to sit: Modified independent (Device/Increase time)     General bed mobility comments: HOB slightly elevated, no use of bedrails  Transfers Overall transfer level: Needs assistance Equipment used: None Transfers: Sit to/from Stand Sit to Stand: Supervision         General transfer comment: Supervision for safety as pt reports dizziness at all times today.    Balance Overall balance assessment: Needs assistance Sitting-balance support: No upper extremity supported;Feet supported Sitting balance-Leahy Scale: Normal     Standing balance support: No upper  extremity supported;During functional activity Standing balance-Leahy Scale: Good                              ADL Overall ADL's : Needs assistance/impaired     Grooming: Wash/dry hands;Oral care;Supervision/safety;Standing;Cueing for compensatory techniques Grooming Details (indicate cue type and reason): difficulty locating items on L side of sink - cues for visual scanning strategies Upper Body Bathing: Set up;Standing Upper Body Bathing Details (indicate cue type and reason): to locate hygiene items on L side - cues for visual scanning strategies Lower Body Bathing: Set up;Sit to/from stand Lower Body Bathing Details (indicate cue type and reason): to locate hygiene items on L side - cues for visual scanning strategies Upper Body Dressing : Standing;Set up Upper Body Dressing Details (indicate cue type and reason): to locate clothing items on L side - cues for visual scanning strategies Lower Body Dressing: Set up;Sit to/from stand Lower Body Dressing Details (indicate cue type and reason): to locate clothing items on L side - cues for visual scanning strategies Toilet Transfer: Modified Independent;Ambulation;Comfort height toilet   Toileting- Clothing Manipulation and Hygiene: Modified independent;Sit to/from stand       Functional mobility during ADLs: Supervision/safety;Cueing for safety General ADL Comments: Pt requires a lot of verbal/tactile cues to complete visual scanning strategies to compensate for L visual field loss     Vision Vision Assessment?: Yes Eye Alignment: Within Functional Limits Ocular Range of Motion: Within Functional Limits Alignment/Gaze Preference: Within Defined Limits Tracking/Visual Pursuits: Decreased smoothness of horizontal tracking Saccades: Decreased speed of saccadic movement Convergence: Within functional limits Visual Fields: Left visual  field deficit (~75-85 degree loss on L side  - central vision is blurry) Depth  Perception: Undershoots   Perception Perception Perception Tested?: Yes Perception Deficits: Inattention/neglect Inattention/Neglect: Appears intact   Praxis Praxis Praxis tested?: Within functional limits    Pertinent Vitals/Pain Pain Assessment: No/denies pain     Hand Dominance Right   Extremity/Trunk Assessment Upper Extremity Assessment Upper Extremity Assessment: Overall WFL for tasks assessed   Lower Extremity Assessment Lower Extremity Assessment: Overall WFL for tasks assessed   Cervical / Trunk Assessment Cervical / Trunk Assessment: Other exceptions Cervical / Trunk Exceptions: Forward head/rounded shoulder posture   Communication Communication Communication: No difficulties   Cognition Arousal/Alertness: Awake/alert Behavior During Therapy: WFL for tasks assessed/performed Overall Cognitive Status: Within Functional Limits for tasks assessed                     General Comments       Exercises       Shoulder Instructions      Home Living Family/patient expects to be discharged to:: Private residence Living Arrangements: Other relatives (granddaughter) Available Help at Discharge: Family;Available PRN/intermittently Type of Home: House Home Access: Ramped entrance     Home Layout: Two level;Able to live on main level with bedroom/bathroom     Bathroom Shower/Tub: Occupational psychologist: Handicapped height Bathroom Accessibility: Yes How Accessible: Accessible via walker Home Equipment: [place] - 2 wheels;Cane - single point;Shower seat - built in;Grab bars - tub/shower;Hand held shower head      Lives With: Other (Comment) (granddaughter)    Prior Functioning/Environment Level of Independence: Independent        Comments: Driving and shopping independently. POA for sister who has dementia at lives at [facility]    OT Diagnosis: Disturbance of vision   OT Problem List: Impaired vision/perception;Impaired balance  (sitting and/or standing);Decreased safety awareness;Decreased knowledge of use of DME or AE;Obesity   OT Treatment/Interventions: Visual/perceptual remediation/compensation;Patient/family education;Balance training;DME and/or AE instruction;Energy conservation    OT Goals(Current goals can be found in the care plan section) Acute Rehab OT Goals Patient Stated Goal: Home as soon as able OT Goal Formulation: With patient Time For Goal Achievement: 06/17/15 Potential to Achieve Goals: Good ADL Goals Pt Will Perform Tub/Shower Transfer: Tub transfer;with modified independence;ambulating;shower seat Additional ADL Goal #1: Pt will demonstrate use of L visual scanning strategies to increase safety with ADLs and mobility due to L visual field loss.  OT Frequency: Min 2X/week   Barriers to D/C: Decreased caregiver support  Granddaughter is in college and is not available 24/7       Co-evaluation              End of Session Equipment Utilized During Treatment: Gait belt Nurse Communication: Mobility status  Activity Tolerance: Patient tolerated treatment well Patient left: in bed;with call bell/phone within reach   Time: [id number] OT Time Calculation (min): 17 min Charges:  OT General Charges $OT Visit: 1 Procedure OT Evaluation $OT Eval Moderate Complexity: 1 Procedure G-Codes:    Redmond Baseman, OTR/L [phone] 06/03/2015, 4:44 PM

## 2015-06-03 NOTE — Progress Notes (Signed)
  Echocardiogram 2D Echocardiogram has been performed.  Darlina Sicilian M 06/03/2015, 9:11 AM

## 2015-06-03 NOTE — Progress Notes (Signed)
Patient arrived to 5C06 from the ER at 2035, A/OX4. Patient oriented to room, equipment , and safety measures. MAEW. VSS. Blurred vision noted bilaterally with worsened symptoms in the left eye. initial NIHSS 1. Will monitor closely overnight.

## 2015-06-03 NOTE — Evaluation (Signed)
Clinical/Bedside Swallow Evaluation Patient Details  Name: Amy Wiggins MRN: [id number] Date of Birth: [birth year]  Today's Date: 06/03/2015 Time: SLP Start Time (ACUTE ONLY): 0855 SLP Stop Time (ACUTE ONLY): 0920 SLP Time Calculation (min) (ACUTE ONLY): 25 min  Past Medical History:  Past Medical History  Diagnosis Date  . Hypertension   . Diabetes mellitus   . Stroke ([place])     TIA's x 2   Past Surgical History:  Past Surgical History  Procedure Laterality Date  . Colonoscopy    . Colonoscopy N/A 09/09/2014    Procedure: COLONOSCOPY;  Surgeon: Rogene Houston, MD;  Location: [facility];  Service: Endoscopy;  Laterality: N/A;  1010 - moved to 7:30 - Ann to notify   HPI:  82 y.o. female with a past medical history of hypertension, diabetes, TIA 2 constant emergency department with complaints of vision changes since Wednesday 9 when she was watching TV. She describes this as persistent blurry vision. She denies facial droop, motor or sensorial deficits, speech or language comprehension difficulty. She complains of headache. Imaging of the brain showed right occipital CVA; passed NSSS, but swallowing evaluation ordered prior to completion; pt denies any dysphagia   Assessment / Plan / Recommendation Clinical Impression   Pt exhibited a normal oropharyngeal swallow with all consistencies; no overt s/s of aspiration noted throughout BSE; regular/thin recommended with no ST f/u warranted    Aspiration Risk  No limitations    Diet Recommendation   Regular/thin  Medication Administration: Whole meds with liquid    Other  Recommendations Oral Care Recommendations: Oral care BID   Follow up Recommendations  None    Frequency and Duration     n/a       Prognosis Prognosis for Safe Diet Advancement: Good      Swallow Study   General Date of Onset: 06/02/15 HPI: 82 y.o. female with a past medical history of hypertension, diabetes, TIA 2 constant emergency department with  complaints of vision changes since Wednesday 9 when she was watching TV. She describes this as persistent blurry vision. She denies facial droop, motor or sensorial deficits, speech or language comprehension difficulty. She complains of headache. Imaging of the brain showed right occipital CVA. Type of Study: Bedside Swallow Evaluation Previous Swallow Assessment: NSSS passed Diet Prior to this Study: NPO Temperature Spikes Noted: No Respiratory Status: Room air History of Recent Intubation: No Behavior/Cognition: Alert;Cooperative;Pleasant mood Oral Cavity Assessment: Within Functional Limits Oral Care Completed by SLP: Recent completion by staff Oral Cavity - Dentition: Adequate natural dentition Vision: Functional for self-feeding Self-Feeding Abilities: Able to feed self Patient Positioning: Upright in bed Baseline Vocal Quality: Normal Volitional Cough: Strong Volitional Swallow: Able to elicit    Oral/Motor/Sensory Function Overall Oral Motor/Sensory Function: Within functional limits   Ice Chips Ice chips: Not tested   Thin Liquid Thin Liquid: Within functional limits Presentation: Cup;Straw    Nectar Thick Nectar Thick Liquid: Not tested   Honey Thick Honey Thick Liquid: Not tested   Puree Puree: Within functional limits Presentation: Self Fed   Solid   GO   Solid: Within functional limits Presentation: Self Fed        ADAMS,PAT, M.S., CCC-SLP 06/03/2015,10:16 AM

## 2015-06-03 NOTE — Progress Notes (Signed)
STROKE TEAM PROGRESS NOTE   HISTORY OF PRESENT ILLNESS 82 yo WF noticed acute vision changes 2 days ago while at home watching TV. She waited to see if it would get better but came to the hospital today when it didn't and saw ophthalmology in the interim with no intrinsic eye issues. She has a history of HTN, DM, high lipids and prior TIA involving vision. She was on ASA daily when this happened. Plavix was added here. CT Brain shows a hypodensity in the right occipital area which is confirmed to be an acute infarct on MRI. MRA Brain and Neck are normal, including the PCA on the right. Tele so far has shown NSR and she has no known history of A. Fib. CBC negative except WBC 14k; no clear infection. CMP normal except GFR 30. T-I and coag negative.   SUBJECTIVE (INTERVAL HISTORY) Patient is feeling better except for some vision changes especially in the left eye. She hopes her vision will get better.   OBJECTIVE Temp:  [97.5 F (36.4 C)-99.1 F (37.3 C)] 97.5 F (36.4 C) (04/08 0500) Pulse Rate:  [67-109] 102 (04/08 0500) Cardiac Rhythm:  [-] Normal sinus rhythm (04/07 2043) Resp:  [13-20] 18 (04/08 0500) BP: (101-158)/(58-95) 101/72 mmHg (04/08 0500) SpO2:  [94 %-100 %] 94 % (04/08 0500) Weight:  [93.441 kg (206 lb)-94.575 kg (208 lb 8 oz)] 93.441 kg (206 lb) (04/07 2030)  CBC:  Recent Labs Lab 06/02/15 1510 06/02/15 1522  WBC 13.9*  --   NEUTROABS 9.6*  --   HGB 13.7 16.0*  HCT 43.2 47.0*  MCV 90.6  --   PLT 223  --     Basic Metabolic Panel:  Recent Labs Lab 06/02/15 1510 06/02/15 1522  NA 139 138  K 3.6 3.5  CL 99* 96*  CO2 28  --   GLUCOSE 134* 132*  BUN 34* 36*  CREATININE 1.76* 1.60*  CALCIUM 10.7*  --     Lipid Panel: No results found for: CHOL, TRIG, HDL, CHOLHDL, VLDL, LDLCALC HgbA1c: No results found for: HGBA1C Urine Drug Screen: No results found for: LABOPIA, COCAINSCRNUR, LABBENZ, AMPHETMU, THCU, LABBARB    IMAGING  Dg Chest 2  View 06/02/2015   No active cardiopulmonary disease.   Ct Head Wo Contrast 06/02/2015   1. Acute nonhemorrhagic stroke involving the right occipital lobe.  2. Remote stroke involving the subcortical white matter of the right posterior parietal lobe as noted on the MRI in October, 2012.     [facility] and Neck Wo Contrast 06/02/2015   1. Acute nonhemorrhagic infarct within the medial right occipital lobe measures 2.5 cm maximally.  2. Remote lacunar infarcts of the basal ganglia and cerebellum bilaterally.  3. Mild age advanced atrophy and white matter disease.  4. Intracranial atherosclerotic changes with mild distal branch vessel irregularity. There is no significant proximal stenosis, aneurysm, or branch vessel occlusion.  5. Mild atherosclerotic changes at the carotid bifurcations bilaterally without a significant stenosis.  6. Flow is antegrade in the vertebral arteries.      PHYSICAL EXAM Physical exam: Exam: Gen: NAD Eyes: anicteric sclerae, moist conjunctivae                    CV: no MRG, no carotid bruits, no peripheral edema Mental Status: Alert, follows commands, good historian, oriented to self, date, month, year, situation.  Neuro: Detailed Neurologic Exam  Speech:    No aphasia, no dysarthria  Cranial Nerves:  The pupils are equal, round, and reactive to light.. Attempted, Fundi not visualized.  EOMI. conjugate midline gaze. She has some mild left homonymous peripheral deficits of which the left lower quadrant of the left eye is most noticeable on confrontation testing.  Face symmetric, Tongue midline. Hearing intact to voice. Shoulder shrug intact  Motor Observation:    no involuntary movements noted. Tone appears normal.     Strength:    5/5     Sensation:  Intact to LT symmetrically  Plantars equivocal.        ASSESSMENT/PLAN [name] is a 82 y.o. female with history of hypertension, hyperlipidemia, chronic kidney disease, diabetes  mellitus, and previous TIAs presenting with visual changes.  She did not receive IV t-PA due to late presentation.  Stroke:  Bilateral infarcts probably embolic from an unknown source.  Resultant  left homonymous visual deficits  MRI  Acute nonhemorrhagic infarct within the medial right occipital lobe measures 2.5 cm maximally.   MRA  no significant stenosis or occlusion.  Carotid Doppler  pending  2D Echo EF 65-70%. No cardiac source of emboli identified.  Patient may need TEE and Loop pending workup.  LDL - 81  HgbA1c pending  VTE prophylaxis - Lovenox  Diet NPO time specified  aspirin 81 mg daily prior to admission, now on clopidogrel 75 mg daily  Patient counseled to be compliant with her antithrombotic medications  Ongoing aggressive stroke risk factor management  Therapy recommendations:  Pending  Disposition: Pending  Hypertension  Blood pressure tends to run low - currently not on antihypertensive medications.  Permissive hypertension (OK if < 220/120) but gradually normalize in 5-7 days  Hyperlipidemia  Home meds:  Lipitor 20 mg daily resumed in hospital  LDL 81, goal < 70  Increase Lipitor to 40 mg daily  Continue statin at discharge  Diabetes  HgbA1c pending, goal < 7.0  Controlled  Other Stroke Risk Factors  Advanced age  Obesity, Body mass index is 36.5 kg/(m^2).   Hx stroke/TIA   Other Active Problems  Mild leukocytosis  Chronic kidney disease  NPO  Hospital day # 1  Lowry Ram [facility] Pager [phone] 06/03/2015, 55:32 AM   82 year old female with a past medical history hypertension, diabetes, TIA who presented with vision changes for several days and found to have right occipital acute stroke. Patient has remote left basal ganglia and left occipital ischemic strokes. Workup may need to include TEE and Loop due to bilateral infarcts and possible embolic source.. Personally examined patient and  images, and have participated in and made any corrections needed to history, physical, neuro exam,assessment and plan as stated above.  I have personally obtained the history, evaluated lab date, reviewed imaging studies and agree with radiology interpretations.    Sarina Ill, MD Stroke Neurology [phone] [facility]       To contact Stroke Continuity provider, please refer to [email or web address] After hours, contact General Neurology

## 2015-06-03 NOTE — Evaluation (Signed)
Physical Therapy Evaluation Patient Details Name: Amy Wiggins MRN: [id number] DOB: 10-Apr-1931 Today's Date: 06/03/2015   History of Present Illness  Pt is an 82 y/o female who presents with visual changes. MRI revealed acute nonhemorrhagic infarct present in the medial right occipital lobe.  Clinical Impression  Pt admitted with above diagnosis. Pt currently with functional limitations due to the deficits listed below (see PT Problem List). At the time of PT eval pt was able to perform transfers and ambulation with close guard or supervision for safety. Pt reporting blurriness in L eye/L visual field. Pt will benefit from skilled PT to increase their independence and safety with mobility to allow discharge to the venue listed below.       Follow Up Recommendations Other (comment) (Outpatient therapy for visual changes - PT vs OT)    Equipment Recommendations  None recommended by PT    Recommendations for Other Services       Precautions / Restrictions Precautions Precautions: Fall Precaution Comments: L visual changes Restrictions Weight Bearing Restrictions: No      Mobility  Bed Mobility Overal bed mobility: Needs Assistance Bed Mobility: Supine to Sit     Supine to sit: Modified independent (Device/Increase time)     General bed mobility comments: HOB slightly elevated. Pt was able to transition to EOB without assist and without use of rails for support.   Transfers Overall transfer level: Needs assistance Equipment used: None Transfers: Sit to/from Stand Sit to Stand: Supervision         General transfer comment: Supervision for safety as pt reports feeling somewhat dizzy upon first sitting up.  Ambulation/Gait Ambulation/Gait assistance: Supervision Ambulation Distance (Feet): 300 Feet Assistive device: None Gait Pattern/deviations: Step-through pattern;Decreased stride length;Trunk flexed Gait velocity: Decreased Gait velocity interpretation: Below  normal speed for age/gender General Gait Details: Somewhat decreased gait speed but fairly steady. Pt reports that she is currently walking slower than normal and feels her legs are fatigued. No physical assist required and no gross LOB noted.   Stairs Stairs: Yes Stairs assistance: Min guard   Number of Stairs: 5 General stair comments: VC's for sequencing and safety awareness. No assist required however close guard provided for safety.   Wheelchair Mobility    Modified Rankin (Stroke Patients Only)       Balance Overall balance assessment: Needs assistance Sitting-balance support: Feet supported;No upper extremity supported Sitting balance-Leahy Scale: Fair     Standing balance support: No upper extremity supported;During functional activity Standing balance-Leahy Scale: Fair                               Pertinent Vitals/Pain Pain Assessment: No/denies pain    Home Living Family/patient expects to be discharged to:: Private residence Living Arrangements: Other relatives (Granddaughter) Available Help at Discharge: Family;Available PRN/intermittently Type of Home: House Home Access: Ramped entrance     Home Layout: Two level;Able to live on main level with bedroom/bathroom Home Equipment: Shower seat - built in;Walker - 2 wheels;Cane - single point      Prior Function Level of Independence: Independent         Comments: Driving and shopping independently.      Hand Dominance   Dominant Hand: Right    Extremity/Trunk Assessment   Upper Extremity Assessment: Overall WFL for tasks assessed           Lower Extremity Assessment: Overall WFL for tasks assessed  Cervical / Trunk Assessment: Other exceptions  Communication   Communication: No difficulties  Cognition Arousal/Alertness: Awake/alert Behavior During Therapy: WFL for tasks assessed/performed Overall Cognitive Status: Within Functional Limits for tasks assessed                       General Comments      Exercises        Assessment/Plan    PT Assessment Patient needs continued PT services  PT Diagnosis Difficulty walking   PT Problem List Decreased strength;Decreased range of motion;Decreased activity tolerance;Decreased balance;Decreased mobility;Decreased knowledge of use of DME;Decreased safety awareness;Decreased knowledge of precautions  PT Treatment Interventions DME instruction;Gait training;Stair training;Functional mobility training;Therapeutic activities;Therapeutic exercise;Neuromuscular re-education;Patient/family education   PT Goals (Current goals can be found in the Care Plan section) Acute Rehab PT Goals Patient Stated Goal: Home as soon as able PT Goal Formulation: With patient Time For Goal Achievement: 06/10/15 Potential to Achieve Goals: Good    Frequency Min 5X/week   Barriers to discharge Decreased caregiver support Granddaughter works and goes to school    Co-evaluation               End of Session   Activity Tolerance: Patient tolerated treatment well Patient left: in chair;with call bell/phone within reach Nurse Communication: Mobility status         Time: 1353-1410 PT Time Calculation (min) (ACUTE ONLY): 17 min   Charges:   PT Evaluation $PT Eval Moderate Complexity: 1 Procedure     PT G Codes:        Rolinda Roan 06/06/15, 2:27 PM   Rolinda Roan, PT, DPT Acute Rehabilitation Services Pager: [phone]

## 2015-06-03 NOTE — Progress Notes (Signed)
I assisted Echo by doing injection of bubbles for bubble study of Echo. TTE.

## 2015-06-03 NOTE — Progress Notes (Signed)
[facility] PROGRESS NOTE  Amy Wiggins [id number] DOB: [birth year] DOA: 06/02/2015  PCP: Asencion Noble, MD  Brief HPI: 82 year old female with a past medical history of hypertension, diabetes, TIA, presented with vision changes for the past many days. She was found to have right occipital acute stroke. She was hospitalized for further management.  Past medical history:  Past Medical History  Diagnosis Date  . Hypertension   . Diabetes mellitus   . Stroke ([place])     TIA's x 2    Consultants: Neurology  Procedures:  Transthoracic echocardiogram Study Conclusions - Left ventricle: The cavity size was normal. There was moderate concentric hypertrophy. Systolic function was vigorous. The estimated ejection fraction was in the range of 65% to 70%. Wall motion was normal; there were no regional wall motion abnormalities. There was an increased relative contribution of atrial contraction to ventricular filling. Doppler parameters are consistent with abnormal left ventricular relaxation (grade 1 diastolic dysfunction). Doppler parameters are consistent with high ventricular filling pressure. - Aortic valve: Moderate diffuse thickening and calcification, consistent with sclerosis. - Mitral valve: Calcified annulus. Mild diffuse thickening and calcification of the anterior leaflet and posterior leaflet.  Carotid Doppler is pending  Antibiotics: None  Subjective: Patient feels well this morning. Still has some visual impairment, however, denies any diplopia or blurred vision. Mainly unable to see objects in her peripheral vision. Denies any weakness in her arms or legs.  Objective: Vital Signs  Filed Vitals:   06/03/15 0235 06/03/15 0430 06/03/15 0500 06/03/15 1055  BP: 106/68 117/58 101/72 102/58  Pulse: 97 67 102 93  Temp: 97.7 F (36.5 C)  97.5 F (36.4 C) 98.6 F (37 C)  TempSrc: Oral  Oral Oral  Resp: 18 19 18 17   Height:      Weight:      SpO2: 94% 94% 94% 95%    No intake or output data in the 24 hours ending 06/03/15 1100 Filed Weights   06/02/15 1520 06/02/15 2030  Weight: 94.575 kg (208 lb 8 oz) 93.441 kg (206 lb)    General appearance: alert, cooperative, appears stated age and no distress Resp: clear to auscultation bilaterally Cardio: regular rate and rhythm, S1, S2 normal, no murmur, click, rub or gallop GI: soft, non-tender; bowel sounds normal; no masses,  no organomegaly Extremities: extremities normal, atraumatic, no cyanosis or edema Neurologic: Awake and alert. Cranial nerves II-12 appear to be intact. Motor strength equal bilateral upper and lower extremities.  Lab Results:  Basic Metabolic Panel:  Recent Labs Lab 06/02/15 1510 06/02/15 1522 06/03/15 0808  NA 139 138 139  K 3.6 3.5 3.2*  CL 99* 96* 99*  CO2 28  --  29  GLUCOSE 134* 132* 176*  BUN 34* 36* 33*  CREATININE 1.76* 1.60* 1.86*  CALCIUM 10.7*  --  9.6   Liver Function Tests:  Recent Labs Lab 06/02/15 1510 06/03/15 0808  AST 24 18  ALT 20 17  ALKPHOS 80 69  BILITOT 0.5 0.5  PROT 6.9 5.7*  ALBUMIN 3.7 3.0*   CBC:  Recent Labs Lab 06/02/15 1510 06/02/15 1522 06/03/15 0808  WBC 13.9*  --  9.4  NEUTROABS 9.6*  --  5.9  HGB 13.7 16.0* 12.2  HCT 43.2 47.0* 38.9  MCV 90.6  --  90.0  PLT 223  --  209   CBG:  Recent Labs Lab 06/02/15 1518 06/02/15 2133 06/03/15 0605  GLUCAP 130* 121* 142*    No results found for this or  any previous visit (from the past 240 hour(s)).    Studies/Results: Dg Chest 2 View  06/02/2015  CLINICAL DATA:  Recent stroke EXAM: CHEST  2 VIEW COMPARISON:  02/28/2005 FINDINGS: Cardiac shadow is within normal limits. The lungs are well aerated bilaterally. No acute bony abnormality is seen degenerative changes about the left shoulder joint are noted. IMPRESSION: No active cardiopulmonary disease. Electronically Signed   By: Inez Catalina M.D.   On: 06/02/2015 21:25   Ct Head Wo Contrast  06/02/2015  CLINICAL DATA:   82 year old presenting with blurred vision and vertigo which began last night. Current history of hypertension and diabetes. Prior stroke. EXAM: CT HEAD WITHOUT CONTRAST TECHNIQUE: Contiguous axial images were obtained from the base of the skull through the vertex without intravenous contrast. COMPARISON:  CT head 08/15/2012, 12/12/2010 and MRI brain 12/12/2010, 10/18/2009. FINDINGS: Geographic low attenuation involving the cortex and white matter of the right occipital lobe. Remote prior stroke in the subcortical white matter of the right posterior parietal lobe. Moderate changes of small vessel disease of the white matter diffusely. Ventricular system normal in size and appearance for age. No mass lesion. No midline shift. No acute hemorrhage or hematoma. No extra-axial fluid collections. Mild changes of hyperostosis frontalis interna. No focal osseous abnormality involving the skull. Tiny mucous retention cyst or polyp in the left maxillary sinus. Remaining visualized paranasal sinuses, bilateral mastoid air cells and bilateral middle ear cavities well-aerated. Extensive bilateral carotid siphon and vertebral artery atherosclerosis. IMPRESSION: 1. Acute nonhemorrhagic stroke involving the right occipital lobe. 2. Remote stroke involving the subcortical white matter of the right posterior parietal lobe as noted on the MRI in October, 2012. I telephoned these results at the time of interpretation on 06/02/2015 at 4:34 pm to [name], a nurse in the emergency department triage, who verbally acknowledged these results. Electronically Signed   By: Evangeline Dakin M.D.   On: 06/02/2015 16:36   Mr [name] Head Wo Contrast  06/02/2015  CLINICAL DATA:  Right-sided headache beginning yesterday. Sent by a eye doctor. No weakness or facial droop. EXAM: MRI HEAD WITHOUT CONTRAST MRA HEAD WITHOUT CONTRAST MRA NECK WITHOUT CONTRAST TECHNIQUE: Multiplanar, multiecho pulse sequences of the brain and surrounding structures were  obtained without intravenous contrast. Angiographic images of the Circle of Willis were obtained using MRA technique without intravenous contrast. Angiographic images of the neck were obtained using MRA technique without intravenous contrast. Carotid stenosis measurements (when applicable) are obtained utilizing NASCET criteria, using the distal internal carotid diameter as the denominator. COMPARISON:  CT head without contrast 06/02/2015. MRI brain 12/12/2010. FINDINGS: MRI HEAD FINDINGS Acute nonhemorrhagic infarct is present in the medial right occipital lobe measured 2.1 x 2.2 x 2.5 cm. No other focal areas of restricted diffusion are evident. T2 changes are associated with the area of acute infarction. Periventricular and subcortical T2 changes are otherwise stable. Remote lacunar infarcts are evident in the cerebellum bilaterally. There are remote lacunar infarcts in the basal ganglia bilaterally as well. MRA HEAD FINDINGS The internal carotid arteries are within normal limits from the high cervical segments through the ICA termini bilaterally. The A1 and M1 segments are normal. The anterior communicating artery is patent. There is moderate attenuation of MCA and ACA branch vessels bilaterally. The left vertebral artery is slightly dominant to the right. The PICA origins are visualized and normal. The vertebrobasilar junction is normal. The basilar artery is within normal limits. Both posterior cerebral arteries originate from the basilar tip. There is some attenuation  of distal PCA branch vessels. MRA NECK FINDINGS Time-of-flight images demonstrate no significant flow disturbance at either carotid bifurcation. Flow is antegrade in the vertebral arteries bilaterally. IMPRESSION: 1. Acute nonhemorrhagic infarct within the medial right occipital lobe measures 2.5 cm maximally. 2. Remote lacunar infarcts of the basal ganglia and cerebellum bilaterally. 3. Mild age advanced atrophy and white matter disease. 4.  Intracranial atherosclerotic changes with mild distal branch vessel irregularity. There is no significant proximal stenosis, aneurysm, or branch vessel occlusion. 5. Mild atherosclerotic changes at the carotid bifurcations bilaterally without a significant stenosis. 6. Flow is antegrade in the vertebral arteries. Electronically Signed   By: San Morelle M.D.   On: 06/02/2015 20:13   Mr Angiogram Neck Wo Contrast  06/02/2015  CLINICAL DATA:  Right-sided headache beginning yesterday. Sent by a eye doctor. No weakness or facial droop. EXAM: MRI HEAD WITHOUT CONTRAST MRA HEAD WITHOUT CONTRAST MRA NECK WITHOUT CONTRAST TECHNIQUE: Multiplanar, multiecho pulse sequences of the brain and surrounding structures were obtained without intravenous contrast. Angiographic images of the Circle of Willis were obtained using MRA technique without intravenous contrast. Angiographic images of the neck were obtained using MRA technique without intravenous contrast. Carotid stenosis measurements (when applicable) are obtained utilizing NASCET criteria, using the distal internal carotid diameter as the denominator. COMPARISON:  CT head without contrast 06/02/2015. MRI brain 12/12/2010. FINDINGS: MRI HEAD FINDINGS Acute nonhemorrhagic infarct is present in the medial right occipital lobe measured 2.1 x 2.2 x 2.5 cm. No other focal areas of restricted diffusion are evident. T2 changes are associated with the area of acute infarction. Periventricular and subcortical T2 changes are otherwise stable. Remote lacunar infarcts are evident in the cerebellum bilaterally. There are remote lacunar infarcts in the basal ganglia bilaterally as well. MRA HEAD FINDINGS The internal carotid arteries are within normal limits from the high cervical segments through the ICA termini bilaterally. The A1 and M1 segments are normal. The anterior communicating artery is patent. There is moderate attenuation of MCA and ACA branch vessels bilaterally.  The left vertebral artery is slightly dominant to the right. The PICA origins are visualized and normal. The vertebrobasilar junction is normal. The basilar artery is within normal limits. Both posterior cerebral arteries originate from the basilar tip. There is some attenuation of distal PCA branch vessels. MRA NECK FINDINGS Time-of-flight images demonstrate no significant flow disturbance at either carotid bifurcation. Flow is antegrade in the vertebral arteries bilaterally. IMPRESSION: 1. Acute nonhemorrhagic infarct within the medial right occipital lobe measures 2.5 cm maximally. 2. Remote lacunar infarcts of the basal ganglia and cerebellum bilaterally. 3. Mild age advanced atrophy and white matter disease. 4. Intracranial atherosclerotic changes with mild distal branch vessel irregularity. There is no significant proximal stenosis, aneurysm, or branch vessel occlusion. 5. Mild atherosclerotic changes at the carotid bifurcations bilaterally without a significant stenosis. 6. Flow is antegrade in the vertebral arteries. Electronically Signed   By: San Morelle M.D.   On: 06/02/2015 20:13   Mr Brain Wo Contrast  06/02/2015  CLINICAL DATA:  Right-sided headache beginning yesterday. Sent by a eye doctor. No weakness or facial droop. EXAM: MRI HEAD WITHOUT CONTRAST MRA HEAD WITHOUT CONTRAST MRA NECK WITHOUT CONTRAST TECHNIQUE: Multiplanar, multiecho pulse sequences of the brain and surrounding structures were obtained without intravenous contrast. Angiographic images of the Circle of Willis were obtained using MRA technique without intravenous contrast. Angiographic images of the neck were obtained using MRA technique without intravenous contrast. Carotid stenosis measurements (when applicable) are obtained utilizing NASCET  criteria, using the distal internal carotid diameter as the denominator. COMPARISON:  CT head without contrast 06/02/2015. MRI brain 12/12/2010. FINDINGS: MRI HEAD FINDINGS Acute  nonhemorrhagic infarct is present in the medial right occipital lobe measured 2.1 x 2.2 x 2.5 cm. No other focal areas of restricted diffusion are evident. T2 changes are associated with the area of acute infarction. Periventricular and subcortical T2 changes are otherwise stable. Remote lacunar infarcts are evident in the cerebellum bilaterally. There are remote lacunar infarcts in the basal ganglia bilaterally as well. MRA HEAD FINDINGS The internal carotid arteries are within normal limits from the high cervical segments through the ICA termini bilaterally. The A1 and M1 segments are normal. The anterior communicating artery is patent. There is moderate attenuation of MCA and ACA branch vessels bilaterally. The left vertebral artery is slightly dominant to the right. The PICA origins are visualized and normal. The vertebrobasilar junction is normal. The basilar artery is within normal limits. Both posterior cerebral arteries originate from the basilar tip. There is some attenuation of distal PCA branch vessels. MRA NECK FINDINGS Time-of-flight images demonstrate no significant flow disturbance at either carotid bifurcation. Flow is antegrade in the vertebral arteries bilaterally. IMPRESSION: 1. Acute nonhemorrhagic infarct within the medial right occipital lobe measures 2.5 cm maximally. 2. Remote lacunar infarcts of the basal ganglia and cerebellum bilaterally. 3. Mild age advanced atrophy and white matter disease. 4. Intracranial atherosclerotic changes with mild distal branch vessel irregularity. There is no significant proximal stenosis, aneurysm, or branch vessel occlusion. 5. Mild atherosclerotic changes at the carotid bifurcations bilaterally without a significant stenosis. 6. Flow is antegrade in the vertebral arteries. Electronically Signed   By: San Morelle M.D.   On: 06/02/2015 20:13    Medications:  Scheduled: .  stroke: mapping our early stages of recovery book   Does not apply Once  .  acetaminophen  1,000 mg Oral QHS  . atorvastatin  20 mg Oral Daily  . chlorthalidone  25 mg Oral q morning - 10a  . clopidogrel  75 mg Oral Once  . enoxaparin (LOVENOX) injection  40 mg Subcutaneous Q24H  . glimepiride  2 mg Oral Q breakfast  . Liraglutide  1.8 mg Subcutaneous QHS  . losartan  100 mg Oral Once  . losartan  100 mg Oral Daily  . multivitamin with minerals  1 tablet Oral QHS  . pantoprazole  40 mg Oral Daily  . pioglitazone  15 mg Oral Daily  . sodium chloride flush  3 mL Intravenous Q12H  . traZODone  100 mg Oral QHS  . venlafaxine XR  75 mg Oral Q breakfast   Continuous: . sodium chloride     [id number]  Assessment/Plan:  Principal Problem:   Acute CVA (cerebrovascular accident) ([place]) Active Problems:   Hypertension   Hyperlipidemia   CKD (chronic kidney disease) stage 4, GFR 15-29 ml/min ([facility])   DM type 2 (diabetes mellitus, type 2) ([facility])   Leukocytosis    Acute stroke involving the right occipital lobe Patient does have visual impairment. Neurology has been consulted. Patient has been placed on Plavix. She was taking aspirin at home. She is on a statin. PT and OT evaluation. Speech therapy evaluation. Echocardiogram report as above. Carotid Doppler is pending. LDL is 81.  Chronic kidney disease stage IV No recent labs available except for this admission. Creatinine was 1.18 in 2012. The creatinine could be at baseline. She does have good urine output. Potassium is noted to be low. This will  be repleted. Recheck labs tomorrow morning.  Essential hypertension Permissive hypertension to be allowed. Hold her oral agents for now. Monitor blood pressures closely.  Diabetes mellitus type II Monitor CBGs. She is on oral agents at home, which are being continued. HbA1c is pending.  Hyperlipidemia Continue atorvastatin.  Leukocytosis WBC was elevated, likely due to stress. No obvious source of infection. Normal this morning.  DVT Prophylaxis:  Lovenox    Code Status: Full code  Family Communication: Discussed with the patient  Disposition Plan: Continue stroke workup.    LOS: 1 day   [facility] Pager [phone] 06/03/2015, 11:00 AM  If 7PM-7AM, please contact night-coverage at [email or web address], password [facility]

## 2015-06-04 ENCOUNTER — Inpatient Hospital Stay (HOSPITAL_COMMUNITY): Payer: Medicare Other

## 2015-06-04 DIAGNOSIS — N179 Acute kidney failure, unspecified: Secondary | ICD-10-CM | POA: Clinically undetermined

## 2015-06-04 DIAGNOSIS — I639 Cerebral infarction, unspecified: Secondary | ICD-10-CM

## 2015-06-04 LAB — BASIC METABOLIC PANEL
ANION GAP: 11 (ref 5–15)
BUN: 45 mg/dL — AB (ref 6–20)
CO2: 29 mmol/L (ref 22–32)
Calcium: 9.4 mg/dL (ref 8.9–10.3)
Chloride: 101 mmol/L (ref 101–111)
Creatinine, Ser: 2.16 mg/dL — ABNORMAL HIGH (ref 0.44–1.00)
GFR calc Af Amer: 23 mL/min — ABNORMAL LOW (ref >60)
GFR, EST NON AFRICAN AMERICAN: 20 mL/min — AB (ref >60)
GLUCOSE: 110 mg/dL — AB (ref 65–99)
POTASSIUM: 3.8 mmol/L (ref 3.5–5.1)
Sodium: 141 mmol/L (ref 135–145)

## 2015-06-04 LAB — CBC
HEMATOCRIT: 39.8 % (ref 36.0–46.0)
Hemoglobin: 12.2 g/dL (ref 12.0–15.0)
MCH: 27.8 pg (ref 26.0–34.0)
MCHC: 30.7 g/dL (ref 30.0–36.0)
MCV: 90.7 fL (ref 78.0–100.0)
PLATELETS: 188 10*3/uL (ref 150–400)
RBC: 4.39 MIL/uL (ref 3.87–5.11)
RDW: 13.7 % (ref 11.5–15.5)
WBC: 9.7 10*3/uL (ref 4.0–10.5)

## 2015-06-04 MED ORDER — SODIUM CHLORIDE 0.9 % IV BOLUS (SEPSIS): 250.0000 mL | Freq: Once | INTRAVENOUS | Status: DC

## 2015-06-04 MED ORDER — SODIUM CHLORIDE 0.9 % IV SOLN
INTRAVENOUS | Status: DC
  Administered 2015-06-05: 11:00:00 via INTRAVENOUS
  Administered 2015-06-06: 75 mL/h via INTRAVENOUS

## 2015-06-04 NOTE — Progress Notes (Signed)
VASCULAR LAB PRELIMINARY  PRELIMINARY  PRELIMINARY  PRELIMINARY  Bilateral lower extremity venous duplex completed.    Preliminary report:  There is no DVT or SVT noted in the bilateral lower extremities.   Jase Reep, RVT 06/04/2015, 3:35 PM

## 2015-06-04 NOTE — Progress Notes (Signed)
VASCULAR LAB PRELIMINARY  PRELIMINARY  PRELIMINARY  PRELIMINARY  Carotid duplex completed.    Preliminary report:  1-39% ICA plaquing.  Vertebral artery flow is antegrade.   Aliceson Dolbow, RVT 06/04/2015, 3:42 PM

## 2015-06-04 NOTE — Progress Notes (Signed)
[facility] PROGRESS NOTE  Amy Wiggins [id number] DOB: 08/12/1931 DOA: 06/02/2015  PCP: Asencion Noble, MD  Brief HPI: 82 year old female with a past medical history of hypertension, diabetes, TIA, presented with vision changes for the past many days. She was found to have right occipital acute stroke. She was hospitalized for further management.  Past medical history:  Past Medical History  Diagnosis Date  . Hypertension   . Diabetes mellitus   . Stroke ([place])     TIA's x 2    Consultants: Neurology  Procedures:  Transthoracic echocardiogram Study Conclusions - Left ventricle: The cavity size was normal. There was moderate concentric hypertrophy. Systolic function was vigorous. The estimated ejection fraction was in the range of 65% to 70%. Wall motion was normal; there were no regional wall motion abnormalities. There was an increased relative contribution of atrial contraction to ventricular filling. Doppler parameters are consistent with abnormal left ventricular relaxation (grade 1 diastolic dysfunction). Doppler parameters are consistent with high ventricular filling pressure. - Aortic valve: Moderate diffuse thickening and calcification, consistent with sclerosis. - Mitral valve: Calcified annulus. Mild diffuse thickening and calcification of the anterior leaflet and posterior leaflet.  Carotid Doppler is pending  Antibiotics: None  Subjective: Patient feels well this morning. She continues to have some visual impairment. Denies any diplopia or blurred vision. Mainly unable to see objects in her peripheral vision. Denies any weakness in her arms or legs.  Objective: Vital Signs  Filed Vitals:   06/03/15 2121 06/04/15 0128 06/04/15 0539 06/04/15 0958  BP: 99/56 91/37 119/50 113/74  Pulse: 89 95 97 84  Temp: 98.5 F (36.9 C) 97.5 F (36.4 C) 97.7 F (36.5 C) 98.2 F (36.8 C)  TempSrc: Oral Oral Oral Oral  Resp: 18 18 18 16   Height:      Weight:        SpO2: 97% 96% 96% 95%   No intake or output data in the 24 hours ending 06/04/15 1111 Filed Weights   06/02/15 1520 06/02/15 2030  Weight: 94.575 kg (208 lb 8 oz) 93.441 kg (206 lb)    General appearance: alert, cooperative, appears stated age and no distress Resp: clear to auscultation bilaterally Cardio: regular rate and rhythm, S1, S2 normal, no murmur, click, rub or gallop GI: soft, non-tender; bowel sounds normal; no masses,  no organomegaly Neurologic: Awake and alert. Cranial nerves II-12 appear to be intact. Motor strength equal bilateral upper and lower extremities.  Lab Results:  Basic Metabolic Panel:  Recent Labs Lab 06/02/15 1510 06/02/15 1522 06/03/15 0808 06/04/15 0511  NA 139 138 139 141  K 3.6 3.5 3.2* 3.8  CL 99* 96* 99* 101  CO2 28  --  29 29  GLUCOSE 134* 132* 176* 110*  BUN 34* 36* 33* 45*  CREATININE 1.76* 1.60* 1.86* 2.16*  CALCIUM 10.7*  --  9.6 9.4   Liver Function Tests:  Recent Labs Lab 06/02/15 1510 06/03/15 0808  AST 24 18  ALT 20 17  ALKPHOS 80 69  BILITOT 0.5 0.5  PROT 6.9 5.7*  ALBUMIN 3.7 3.0*   CBC:  Recent Labs Lab 06/02/15 1510 06/02/15 1522 06/03/15 0808 06/04/15 0511  WBC 13.9*  --  9.4 9.7  NEUTROABS 9.6*  --  5.9  --   HGB 13.7 16.0* 12.2 12.2  HCT 43.2 47.0* 38.9 39.8  MCV 90.6  --  90.0 90.7  PLT 223  --  209 188   CBG:  Recent Labs Lab 06/02/15 1518 06/02/15  2133 06/03/15 0605  GLUCAP 130* 121* 142*      Studies/Results: Dg Chest 2 View  06/02/2015  CLINICAL DATA:  Recent stroke EXAM: CHEST  2 VIEW COMPARISON:  02/28/2005 FINDINGS: Cardiac shadow is within normal limits. The lungs are well aerated bilaterally. No acute bony abnormality is seen degenerative changes about the left shoulder joint are noted. IMPRESSION: No active cardiopulmonary disease. Electronically Signed   By: Inez Catalina M.D.   On: 06/02/2015 21:25   Ct Head Wo Contrast  06/02/2015  CLINICAL DATA:  82 year old presenting with  blurred vision and vertigo which began last night. Current history of hypertension and diabetes. Prior stroke. EXAM: CT HEAD WITHOUT CONTRAST TECHNIQUE: Contiguous axial images were obtained from the base of the skull through the vertex without intravenous contrast. COMPARISON:  CT head 08/15/2012, 12/12/2010 and MRI brain 12/12/2010, 10/18/2009. FINDINGS: Geographic low attenuation involving the cortex and white matter of the right occipital lobe. Remote prior stroke in the subcortical white matter of the right posterior parietal lobe. Moderate changes of small vessel disease of the white matter diffusely. Ventricular system normal in size and appearance for age. No mass lesion. No midline shift. No acute hemorrhage or hematoma. No extra-axial fluid collections. Mild changes of hyperostosis frontalis interna. No focal osseous abnormality involving the skull. Tiny mucous retention cyst or polyp in the left maxillary sinus. Remaining visualized paranasal sinuses, bilateral mastoid air cells and bilateral middle ear cavities well-aerated. Extensive bilateral carotid siphon and vertebral artery atherosclerosis. IMPRESSION: 1. Acute nonhemorrhagic stroke involving the right occipital lobe. 2. Remote stroke involving the subcortical white matter of the right posterior parietal lobe as noted on the MRI in October, 2012. I telephoned these results at the time of interpretation on 06/02/2015 at 4:34 pm to [name], a nurse in the emergency department triage, who verbally acknowledged these results. Electronically Signed   By: Evangeline Dakin M.D.   On: 06/02/2015 16:36   Mr [name] Head Wo Contrast  06/02/2015  CLINICAL DATA:  Right-sided headache beginning yesterday. Sent by a eye doctor. No weakness or facial droop. EXAM: MRI HEAD WITHOUT CONTRAST MRA HEAD WITHOUT CONTRAST MRA NECK WITHOUT CONTRAST TECHNIQUE: Multiplanar, multiecho pulse sequences of the brain and surrounding structures were obtained without intravenous  contrast. Angiographic images of the Circle of Willis were obtained using MRA technique without intravenous contrast. Angiographic images of the neck were obtained using MRA technique without intravenous contrast. Carotid stenosis measurements (when applicable) are obtained utilizing NASCET criteria, using the distal internal carotid diameter as the denominator. COMPARISON:  CT head without contrast 06/02/2015. MRI brain 12/12/2010. FINDINGS: MRI HEAD FINDINGS Acute nonhemorrhagic infarct is present in the medial right occipital lobe measured 2.1 x 2.2 x 2.5 cm. No other focal areas of restricted diffusion are evident. T2 changes are associated with the area of acute infarction. Periventricular and subcortical T2 changes are otherwise stable. Remote lacunar infarcts are evident in the cerebellum bilaterally. There are remote lacunar infarcts in the basal ganglia bilaterally as well. MRA HEAD FINDINGS The internal carotid arteries are within normal limits from the high cervical segments through the ICA termini bilaterally. The A1 and M1 segments are normal. The anterior communicating artery is patent. There is moderate attenuation of MCA and ACA branch vessels bilaterally. The left vertebral artery is slightly dominant to the right. The PICA origins are visualized and normal. The vertebrobasilar junction is normal. The basilar artery is within normal limits. Both posterior cerebral arteries originate from the basilar tip. There is  some attenuation of distal PCA branch vessels. MRA NECK FINDINGS Time-of-flight images demonstrate no significant flow disturbance at either carotid bifurcation. Flow is antegrade in the vertebral arteries bilaterally. IMPRESSION: 1. Acute nonhemorrhagic infarct within the medial right occipital lobe measures 2.5 cm maximally. 2. Remote lacunar infarcts of the basal ganglia and cerebellum bilaterally. 3. Mild age advanced atrophy and white matter disease. 4. Intracranial atherosclerotic  changes with mild distal branch vessel irregularity. There is no significant proximal stenosis, aneurysm, or branch vessel occlusion. 5. Mild atherosclerotic changes at the carotid bifurcations bilaterally without a significant stenosis. 6. Flow is antegrade in the vertebral arteries. Electronically Signed   By: San Morelle M.D.   On: 06/02/2015 20:13   Mr Angiogram Neck Wo Contrast  06/02/2015  CLINICAL DATA:  Right-sided headache beginning yesterday. Sent by a eye doctor. No weakness or facial droop. EXAM: MRI HEAD WITHOUT CONTRAST MRA HEAD WITHOUT CONTRAST MRA NECK WITHOUT CONTRAST TECHNIQUE: Multiplanar, multiecho pulse sequences of the brain and surrounding structures were obtained without intravenous contrast. Angiographic images of the Circle of Willis were obtained using MRA technique without intravenous contrast. Angiographic images of the neck were obtained using MRA technique without intravenous contrast. Carotid stenosis measurements (when applicable) are obtained utilizing NASCET criteria, using the distal internal carotid diameter as the denominator. COMPARISON:  CT head without contrast 06/02/2015. MRI brain 12/12/2010. FINDINGS: MRI HEAD FINDINGS Acute nonhemorrhagic infarct is present in the medial right occipital lobe measured 2.1 x 2.2 x 2.5 cm. No other focal areas of restricted diffusion are evident. T2 changes are associated with the area of acute infarction. Periventricular and subcortical T2 changes are otherwise stable. Remote lacunar infarcts are evident in the cerebellum bilaterally. There are remote lacunar infarcts in the basal ganglia bilaterally as well. MRA HEAD FINDINGS The internal carotid arteries are within normal limits from the high cervical segments through the ICA termini bilaterally. The A1 and M1 segments are normal. The anterior communicating artery is patent. There is moderate attenuation of MCA and ACA branch vessels bilaterally. The left vertebral artery is  slightly dominant to the right. The PICA origins are visualized and normal. The vertebrobasilar junction is normal. The basilar artery is within normal limits. Both posterior cerebral arteries originate from the basilar tip. There is some attenuation of distal PCA branch vessels. MRA NECK FINDINGS Time-of-flight images demonstrate no significant flow disturbance at either carotid bifurcation. Flow is antegrade in the vertebral arteries bilaterally. IMPRESSION: 1. Acute nonhemorrhagic infarct within the medial right occipital lobe measures 2.5 cm maximally. 2. Remote lacunar infarcts of the basal ganglia and cerebellum bilaterally. 3. Mild age advanced atrophy and white matter disease. 4. Intracranial atherosclerotic changes with mild distal branch vessel irregularity. There is no significant proximal stenosis, aneurysm, or branch vessel occlusion. 5. Mild atherosclerotic changes at the carotid bifurcations bilaterally without a significant stenosis. 6. Flow is antegrade in the vertebral arteries. Electronically Signed   By: San Morelle M.D.   On: 06/02/2015 20:13   Mr Brain Wo Contrast  06/02/2015  CLINICAL DATA:  Right-sided headache beginning yesterday. Sent by a eye doctor. No weakness or facial droop. EXAM: MRI HEAD WITHOUT CONTRAST MRA HEAD WITHOUT CONTRAST MRA NECK WITHOUT CONTRAST TECHNIQUE: Multiplanar, multiecho pulse sequences of the brain and surrounding structures were obtained without intravenous contrast. Angiographic images of the Circle of Willis were obtained using MRA technique without intravenous contrast. Angiographic images of the neck were obtained using MRA technique without intravenous contrast. Carotid stenosis measurements (when applicable) are obtained  utilizing NASCET criteria, using the distal internal carotid diameter as the denominator. COMPARISON:  CT head without contrast 06/02/2015. MRI brain 12/12/2010. FINDINGS: MRI HEAD FINDINGS Acute nonhemorrhagic infarct is present  in the medial right occipital lobe measured 2.1 x 2.2 x 2.5 cm. No other focal areas of restricted diffusion are evident. T2 changes are associated with the area of acute infarction. Periventricular and subcortical T2 changes are otherwise stable. Remote lacunar infarcts are evident in the cerebellum bilaterally. There are remote lacunar infarcts in the basal ganglia bilaterally as well. MRA HEAD FINDINGS The internal carotid arteries are within normal limits from the high cervical segments through the ICA termini bilaterally. The A1 and M1 segments are normal. The anterior communicating artery is patent. There is moderate attenuation of MCA and ACA branch vessels bilaterally. The left vertebral artery is slightly dominant to the right. The PICA origins are visualized and normal. The vertebrobasilar junction is normal. The basilar artery is within normal limits. Both posterior cerebral arteries originate from the basilar tip. There is some attenuation of distal PCA branch vessels. MRA NECK FINDINGS Time-of-flight images demonstrate no significant flow disturbance at either carotid bifurcation. Flow is antegrade in the vertebral arteries bilaterally. IMPRESSION: 1. Acute nonhemorrhagic infarct within the medial right occipital lobe measures 2.5 cm maximally. 2. Remote lacunar infarcts of the basal ganglia and cerebellum bilaterally. 3. Mild age advanced atrophy and white matter disease. 4. Intracranial atherosclerotic changes with mild distal branch vessel irregularity. There is no significant proximal stenosis, aneurysm, or branch vessel occlusion. 5. Mild atherosclerotic changes at the carotid bifurcations bilaterally without a significant stenosis. 6. Flow is antegrade in the vertebral arteries. Electronically Signed   By: San Morelle M.D.   On: 06/02/2015 20:13    Medications:  Scheduled: .  stroke: mapping our early stages of recovery book   Does not apply Once  . acetaminophen  1,000 mg Oral QHS    . atorvastatin  40 mg Oral q1800  . clopidogrel  75 mg Oral Once  . enoxaparin (LOVENOX) injection  30 mg Subcutaneous Q24H  . Liraglutide  1.8 mg Subcutaneous QHS  . multivitamin with minerals  1 tablet Oral QHS  . pantoprazole  40 mg Oral Daily  . pioglitazone  15 mg Oral Daily  . sodium chloride  250 mL Intravenous Once  . sodium chloride flush  3 mL Intravenous Q12H  . traZODone  100 mg Oral QHS  . venlafaxine XR  75 mg Oral Q breakfast   Continuous: . sodium chloride     [id number]  Assessment/Plan:  Principal Problem:   Acute CVA (cerebrovascular accident) ([place]) Active Problems:   Hypertension   Hyperlipidemia   CKD (chronic kidney disease) stage 4, GFR 15-29 ml/min ([facility])   DM type 2 (diabetes mellitus, type 2) ([facility])   Leukocytosis   Cerebral infarction due to unspecified mechanism   ARF (acute renal failure) ([facility])    Acute stroke involving the right occipital lobe Patient does have visual impairment. Neurology has been consulted. Patient has been placed on Plavix. She was taking aspirin at home. She is on a statin. PT and OT evaluation. Speech therapy evaluation. Echocardiogram report as above. Carotid Doppler is pending. LDL is 81. Plan is for TEE during this hospitalization.  Acute on Chronic kidney disease stage IV No recent labs available except for this admission. Creatinine was 1.18 in 2012. BUN and creatinine noted to be higher than yesterday. Patient will be given IV fluids. Avoid nephrotoxic agents. Monitor urine  output. Repeat labs tomorrow morning. Consider renal ultrasound if creatinine continues to rise. Otherwise, can be pursued as an outpatient.  Essential hypertension Her pressure actually has been low. She was given a fluid bolus and started on IV fluids. Monitor blood pressures closely.  Diabetes mellitus type II Monitor CBGs. She is on oral agents at home, which are being continued. HbA1c is pending.  Hyperlipidemia Continue  atorvastatin.  Leukocytosis WBC was elevated, likely due to stress. No obvious source of infection. Subsequently normal.   DVT Prophylaxis: Lovenox    Code Status: Full code  Family Communication: Discussed with the patient  Disposition Plan: Plan is for TEE during this hospitalization. Discussed with Dr. Jaynee Eagles with neurology.    LOS: 2 days   [facility] Pager [phone] 06/04/2015, 11:11 AM  If 7PM-7AM, please contact night-coverage at [email or web address], password [facility]

## 2015-06-04 NOTE — Progress Notes (Signed)
STROKE TEAM PROGRESS NOTE   HISTORY OF PRESENT ILLNESS 82 yo WF noticed acute vision changes 2 days ago while at home watching TV. She waited to see if it would get better but came to the hospital today when it didn't and saw ophthalmology in the interim with no intrinsic eye issues. She has a history of HTN, DM, high lipids and prior TIA involving vision. She was on ASA daily when this happened. Plavix was added here. CT Brain shows a hypodensity in the right occipital area which is confirmed to be an acute infarct on MRI. MRA Brain and Neck are normal, including the PCA on the right. Tele so far has shown NSR and she has no known history of A. Fib. CBC negative except WBC 14k; no clear infection. CMP normal except GFR 30. T-I and coag negative.   SUBJECTIVE (INTERVAL HISTORY) vision is still impaired. Pending TEE and loop Monday.    OBJECTIVE Temp:  [97.5 F (36.4 C)-99.1 F (37.3 C)] 97.7 F (36.5 C) (04/09 0539) Pulse Rate:  [89-100] 97 (04/09 0539) Cardiac Rhythm:  [-] Normal sinus rhythm (04/08 1900) Resp:  [17-18] 18 (04/09 0539) BP: (91-119)/(37-63) 119/50 mmHg (04/09 0539) SpO2:  [95 %-97 %] 96 % (04/09 0539)  CBC:  Recent Labs Lab 06/02/15 1510  06/03/15 0808 06/04/15 0511  WBC 13.9*  --  9.4 9.7  NEUTROABS 9.6*  --  5.9  --   HGB 13.7  < > 12.2 12.2  HCT 43.2  < > 38.9 39.8  MCV 90.6  --  90.0 90.7  PLT 223  --  209 188  < > = values in this interval not displayed.  Basic Metabolic Panel:   Recent Labs Lab 06/03/15 0808 06/04/15 0511  NA 139 141  K 3.2* 3.8  CL 99* 101  CO2 29 29  GLUCOSE 176* 110*  BUN 33* 45*  CREATININE 1.86* 2.16*  CALCIUM 9.6 9.4    Lipid Panel:     Component Value Date/Time   CHOL 198 06/03/2015 0808   TRIG 373* 06/03/2015 0808   HDL 42 06/03/2015 0808   CHOLHDL 4.7 06/03/2015 0808   VLDL 75* 06/03/2015 0808   LDLCALC 81 06/03/2015 0808   HgbA1c: No results found for: HGBA1C Urine Drug Screen: No results found  for: LABOPIA, COCAINSCRNUR, LABBENZ, AMPHETMU, THCU, LABBARB    IMAGING  Dg Chest 2 View 06/02/2015   No active cardiopulmonary disease.   Ct Head Wo Contrast 06/02/2015   1. Acute nonhemorrhagic stroke involving the right occipital lobe.  2. Remote stroke involving the subcortical white matter of the right posterior parietal lobe as noted on the MRI in October, 2012.     Mr [facility] Neck Wo Contrast 06/02/2015   1. Acute nonhemorrhagic infarct within the medial right occipital lobe measures 2.5 cm maximally.  2. Remote lacunar infarcts of the basal ganglia and cerebellum bilaterally.  3. Mild age advanced atrophy and white matter disease.  4. Intracranial atherosclerotic changes with mild distal branch vessel irregularity. There is no significant proximal stenosis, aneurysm, or branch vessel occlusion.  5. Mild atherosclerotic changes at the carotid bifurcations bilaterally without a significant stenosis.  6. Flow is antegrade in the vertebral arteries.      PHYSICAL EXAM  Exam: Gen: NAD Eyes: anicteric sclerae, moist conjunctivae                    CV: no MRG, no carotid bruits, no peripheral edema Mental Status:  Alert, follows commands, good historian, oriented to self, date, month, year, situation.  Neuro: Detailed Neurologic Exam  Speech:    No aphasia, no dysarthria  Cranial Nerves:    The pupils are equal, round, and reactive to light.. Attempted, Fundi not visualized.  EOMI. conjugate midline gaze. She has some mild left homonymous peripheral deficits of which the left lower quadrant of the left eye is most noticeable on confrontation testing.  Face symmetric, Tongue midline. Hearing intact to voice. Shoulder shrug intact  Motor Observation:    no involuntary movements noted. Tone appears normal.     Strength:    5/5     Sensation:  Intact to LT symmetrically  Plantars equivocal.        ASSESSMENT/PLAN [name] is a 82 y.o. female with  history of hypertension, hyperlipidemia, chronic kidney disease, diabetes mellitus, and previous TIAs presenting with visual changes.  She did not receive IV t-PA due to late presentation.  Stroke:  Bilateral infarcts probably embolic from an unknown source.  Resultant  left homonymous visual deficits  MRI  Acute nonhemorrhagic infarct within the medial right occipital lobe measures 2.5 cm maximally.   MRA  no significant stenosis or occlusion.  Carotid Doppler  pending  2D Echo EF 65-70%. No cardiac source of emboli identified.  TEE and Loop pending   LDL - 81  HgbA1c pending  VTE prophylaxis - Lovenox Diet Carb Modified Fluid consistency:: Thin; Room service appropriate?: Yes  aspirin 81 mg daily prior to admission, now on clopidogrel 75 mg daily  Patient counseled to be compliant with her antithrombotic medications  Ongoing aggressive stroke risk factor management  Therapy recommendations:  Outpatient therapy for visual changes - PT vs OT  Disposition: Pending  Hypertension  Blood pressure tends to run low - currently not on antihypertensive medications.  Permissive hypertension (OK if < 220/120) but gradually normalize in 5-7 days  Hyperlipidemia  Home meds:  Lipitor 20 mg daily resumed in hospital  LDL 81, goal < 70  Lipitor increased to 40 mg daily  Continue statin at discharge  Diabetes  HgbA1c pending, goal < 7.0  Controlled  Other Stroke Risk Factors  Advanced age  Obesity, Body mass index is 36.5 kg/(m^2).   Hx stroke/TIA   Other Active Problems  Mild leukocytosis  Chronic kidney disease  NS IV at 75 cc / hr to start today. Should help BP and renal function. Consider small bolus as well.   Hospital day # 2  Mikey Bussing PA-C [facility] Pager [phone] 06/04/2015, 46:48 AM   83 year old female with a past medical history hypertension, diabetes, TIA who presented with vision changes for several days and found to  have right occipital acute stroke. Patient has remote left basal ganglia and left occipital ischemic strokes. Workup may need to include TEE and Loop due to bilateral infarcts and possible embolic source.. Personally examined patient and images, and have participated in and made any corrections needed to history, physical, neuro exam,assessment and plan as stated above.  I have personally obtained the history, evaluated lab date, reviewed imaging studies and agree with radiology interpretations.    Sarina Ill, MD Stroke Neurology [phone] [facility]       To contact Stroke Continuity provider, please refer to [email or web address] After hours, contact General Neurology

## 2015-06-05 LAB — BASIC METABOLIC PANEL
ANION GAP: 11 (ref 5–15)
BUN: 35 mg/dL — ABNORMAL HIGH (ref 6–20)
CO2: 28 mmol/L (ref 22–32)
Calcium: 9.4 mg/dL (ref 8.9–10.3)
Chloride: 102 mmol/L (ref 101–111)
Creatinine, Ser: 1.73 mg/dL — ABNORMAL HIGH (ref 0.44–1.00)
GFR, EST AFRICAN AMERICAN: 31 mL/min — AB (ref >60)
GFR, EST NON AFRICAN AMERICAN: 26 mL/min — AB (ref >60)
GLUCOSE: 125 mg/dL — AB (ref 65–99)
POTASSIUM: 3.4 mmol/L — AB (ref 3.5–5.1)
Sodium: 141 mmol/L (ref 135–145)

## 2015-06-05 LAB — CBC
HEMATOCRIT: 37 % (ref 36.0–46.0)
Hemoglobin: 11.5 g/dL — ABNORMAL LOW (ref 12.0–15.0)
MCH: 28.2 pg (ref 26.0–34.0)
MCHC: 31.1 g/dL (ref 30.0–36.0)
MCV: 90.7 fL (ref 78.0–100.0)
PLATELETS: 196 10*3/uL (ref 150–400)
RBC: 4.08 MIL/uL (ref 3.87–5.11)
RDW: 13.8 % (ref 11.5–15.5)
WBC: 9.2 10*3/uL (ref 4.0–10.5)

## 2015-06-05 LAB — HEMOGLOBIN A1C
HEMOGLOBIN A1C: 7.7 % — AB (ref 4.8–5.6)
MEAN PLASMA GLUCOSE: 174 mg/dL

## 2015-06-05 MED ORDER — POTASSIUM CHLORIDE CRYS ER 20 MEQ PO TBCR
20.0000 meq | EXTENDED_RELEASE_TABLET | Freq: Once | ORAL | Status: AC
  Administered 2015-06-05: 20 meq via ORAL
  Filled 2015-06-05: qty 1

## 2015-06-05 MED ORDER — ACETAMINOPHEN 325 MG PO TABS
650.0000 mg | ORAL_TABLET | Freq: Four times a day (QID) | ORAL | Status: DC | PRN
  Administered 2015-06-05 – 2015-06-06 (×2): 650 mg via ORAL
  Filled 2015-06-05 (×2): qty 2

## 2015-06-05 NOTE — Progress Notes (Signed)
STROKE TEAM PROGRESS NOTE   SUBJECTIVE (INTERVAL HISTORY)  Patient sitting up in the chair. Still complains of trouble with her vision. She would like to stay in the hospital until her workup is done - TEE delayed until Wed due to scheduling. Cre improving. She was encouraged to take more fluid.    OBJECTIVE Temp:  [97.8 F (36.6 C)-99.4 F (37.4 C)] 99.4 F (37.4 C) (04/10 0935) Pulse Rate:  [84-104] 104 (04/10 0935) Cardiac Rhythm:  [-] Heart block (04/09 1900) Resp:  [16-20] 20 (04/10 0935) BP: (112-145)/(49-74) 115/70 mmHg (04/10 0935) SpO2:  [94 %-98 %] 96 % (04/10 0935)  CBC:  Recent Labs Lab 06/02/15 1510  06/03/15 0808 06/04/15 0511 06/05/15 0252  WBC 13.9*  --  9.4 9.7 9.2  NEUTROABS 9.6*  --  5.9  --   --   HGB 13.7  < > 12.2 12.2 11.5*  HCT 43.2  < > 38.9 39.8 37.0  MCV 90.6  --  90.0 90.7 90.7  PLT 223  --  209 188 196  < > = values in this interval not displayed.  Basic Metabolic Panel:   Recent Labs Lab 06/04/15 0511 06/05/15 0252  NA 141 141  K 3.8 3.4*  CL 101 102  CO2 29 28  GLUCOSE 110* 125*  BUN 45* 35*  CREATININE 2.16* 1.73*  CALCIUM 9.4 9.4   Lipid Panel:     Component Value Date/Time   CHOL 198 06/03/2015 0808   TRIG 373* 06/03/2015 0808   HDL 42 06/03/2015 0808   CHOLHDL 4.7 06/03/2015 0808   VLDL 75* 06/03/2015 0808   LDLCALC 81 06/03/2015 0808   HgbA1c: No results found for: HGBA1C Urine Drug Screen: No results found for: LABOPIA, COCAINSCRNUR, LABBENZ, AMPHETMU, THCU, LABBARB    IMAGING I have personally reviewed the radiological images below and agree with the radiology interpretations.  Dg Chest 2 View 06/02/2015   No active cardiopulmonary disease.   Ct Head Wo Contrast 06/02/2015   1. Acute nonhemorrhagic stroke involving the right occipital lobe.  2. Remote stroke involving the subcortical white matter of the right posterior parietal lobe as noted on the MRI in October, 2012.   Mri & Mra Head and Mra Neck Wo  Contrast 06/02/2015   1. Acute nonhemorrhagic infarct within the medial right occipital lobe measures 2.5 cm maximally.  2. Remote lacunar infarcts of the basal ganglia and cerebellum bilaterally.  3. Mild age advanced atrophy and white matter disease.  4. Intracranial atherosclerotic changes with mild distal branch vessel irregularity. There is no significant proximal stenosis, aneurysm, or branch vessel occlusion.  5. Mild atherosclerotic changes at the carotid bifurcations bilaterally without a significant stenosis.  6. Flow is antegrade in the vertebral arteries.   Carotid Doppler   There is 1-39% bilateral ICA stenosis. Vertebral artery flow is antegrade.    LE venous doppler - negative for DVT  2D echo - - Left ventricle: The cavity size was normal. There was moderate  concentric hypertrophy. Systolic function was vigorous. The  estimated ejection fraction was in the range of 65% to 70%. Wall  motion was normal; there were no regional wall motion  abnormalities. There was an increased relative contribution of  atrial contraction to ventricular filling. Doppler parameters are  consistent with abnormal left ventricular relaxation (grade 1  diastolic dysfunction). Doppler parameters are consistent with  high ventricular filling pressure. - Aortic valve: Moderate diffuse thickening and calcification,  consistent with sclerosis. - Mitral valve: Calcified  annulus. Mild diffuse thickening and  calcification of the anterior leaflet and posterior leaflet.   PHYSICAL EXAM Exam: Gen: NAD Eyes: anicteric sclerae, moist conjunctivae                    CV: no MRG, no carotid bruits, no peripheral edema Mental Status: Alert, follows commands, good historian, oriented to self, date, month, year, situation. Neuro:   Detailed Neurologic Exam Speech:    No aphasia, no dysarthria Cranial Nerves:    The pupils are equal, round, and reactive to light.. Attempted, Fundi not visualized.   EOMI. conjugate midline gaze. She has left homonymous hemianopia, lower quadrant more severe than upper quandrant.  Face symmetric, Tongue midline. Hearing intact to voice. Shoulder shrug intact Motor Observation:    no involuntary movements noted. Tone appears normal.  Strength:    5/5 Sensation:    Intact to LT symmetrically Plantars equivocal.    ASSESSMENT/PLAN Amy Wiggins is a 82 y.o. female with history of hypertension, hyperlipidemia, chronic kidney disease, diabetes mellitus, and previous TIAs presenting with visual changes.  She did not receive IV t-PA due to late presentation.  Stroke:  Right PCA infarcts, possible embolic from an unknown source.  Resultant  left homonymous visual deficits, lower quadrant more than upper quadrant  MRI  Acute infarct at the medial right occipital lobe.   MRA  no significant stenosis or occlusion.  Carotid Doppler No significant stenosis   2D Echo EF 65-70%. No cardiac source of emboli identified.  LE venous doppler - no DVT  TEE and Loop scheduled for Wed due to scheduling difficulties. Made NPO after midnight Wed night for TEE Wed. Patient reports palpitations 2-3 x/day  LDL - 81  HgbA1c 7.7  VTE prophylaxis - Lovenox Diet Carb Modified Fluid consistency:: Thin; Room service appropriate?: Yes  aspirin 81 mg daily prior to admission, now on clopidogrel 75 mg daily  Patient counseled to be compliant with her antithrombotic medications  Ongoing aggressive stroke risk factor management  Therapy recommendations:  Outpatient therapy for visual changes - PT vs OT   Patient advised no driving at discharge  Disposition: return home  Hypertension  Blood pressure tends to run low - currently not on antihypertensive medications.  Permissive hypertension (OK if < 220/120) but gradually normalize in 5-7 days  Hyperlipidemia  Home meds:  Lipitor 20 mg daily  LDL 81, goal < 70  Lipitor increased to 40 mg  daily  Continue statin at discharge  Diabetes  HgbA1c 7.7, goal < 7.0  Not in good controlled  On liraglutide and actos   Follow up with PCP closely  Other Stroke Risk Factors  Advanced age  Obesity, Body mass index is 36.5 kg/(m^2).   Hx stroke/TIA  Other Active Problems  Mild leukocytosis  Chronic kidney disease - Cre improving  NS IV at 75 cc / hr   Hospital day # 3  Rosalin Hawking, MD PhD Stroke Neurology 06/05/2015 2:33 PM    To contact Stroke Continuity provider, please refer to [email or web address] After hours, contact General Neurology

## 2015-06-05 NOTE — Care Management Note (Signed)
Case Management Note  Patient Details  Name: Amy Wiggins MRN: [id number] Date of Birth: 04/23/1931  Subjective/Objective:                    Action/Plan: Patient was admitted with CVA. Lives at home with granddaughter. Will follow for discharge needs pending PT/OT evals and physician orders.   Expected Discharge Date:                  Expected Discharge Plan:     In-House Referral:     Discharge planning Services     Post Acute Care Choice:    Choice offered to:     DME Arranged:    DME Agency:     HH Arranged:    HH Agency:     Status of Service:  In process, will continue to follow  Medicare Important Message Given:    Date Medicare IM Given:    Medicare IM give by:    Date Additional Medicare IM Given:    Additional Medicare Important Message give by:     If discussed at [place] of Stay Meetings, dates discussed:    Additional CommentsRolm Baptise, RN 06/05/2015, 8:38 AM [phone]

## 2015-06-05 NOTE — Progress Notes (Signed)
[facility] PROGRESS NOTE  Amy Wiggins [id number] DOB: January 01, 1932 DOA: 06/02/2015  PCP: Asencion Noble, MD  Brief HPI: 82 year old female with a past medical history of hypertension, diabetes, TIA, presented with vision changes for the past many days. She was found to have right occipital acute stroke. She was hospitalized for further management.  Past medical history:  Past Medical History  Diagnosis Date  . Hypertension   . Diabetes mellitus   . Stroke ([place])     TIA's x 2    Consultants: Neurology  Procedures:  Transthoracic echocardiogram Study Conclusions - Left ventricle: The cavity size was normal. There was moderate concentric hypertrophy. Systolic function was vigorous. The estimated ejection fraction was in the range of 65% to 70%. Wall motion was normal; there were no regional wall motion abnormalities. There was an increased relative contribution of atrial contraction to ventricular filling. Doppler parameters are consistent with abnormal left ventricular relaxation (grade 1 diastolic dysfunction). Doppler parameters are consistent with high ventricular filling pressure. - Aortic valve: Moderate diffuse thickening and calcification, consistent with sclerosis. - Mitral valve: Calcified annulus. Mild diffuse thickening and calcification of the anterior leaflet and posterior leaflet.  Carotid Doppler 1-39% ICA plaquing. Vertebral artery flow is antegrade.   Lower extremity venous Dopplers There is no DVT or SVT noted in the bilateral lower extremities.   Antibiotics: None  Subjective: Patient continues to have visual impairment. Mainly involving the peripheral vision on the right. Denies any weakness in her arms or legs. Does have a mild headache in the back of her head. No nausea or vomiting.   Objective: Vital Signs  Filed Vitals:   06/04/15 1801 06/04/15 2221 06/05/15 0146 06/05/15 0505  BP: 123/63 145/63 112/49 135/74  Pulse: 101 92 99 102    Temp: 97.8 F (36.6 C) 99.1 F (37.3 C) 98.1 F (36.7 C) 98.4 F (36.9 C)  TempSrc: Oral Oral Oral Oral  Resp: 17 18 18 16   Height:      Weight:      SpO2: 95% 98% 94% 95%    Intake/Output Summary (Last 24 hours) at 06/05/15 0853 Last data filed at 06/05/15 0835  Gross per 24 hour  Intake    240 ml  Output      0 ml  Net    240 ml   Filed Weights   06/02/15 1520 06/02/15 2030  Weight: 94.575 kg (208 lb 8 oz) 93.441 kg (206 lb)    General appearance: alert, cooperative, appears stated age and no distress Resp: clear to auscultation bilaterally. No crackles or wheezing. Cardio: regular rate and rhythm, S1, S2 normal, no murmur, click, rub or gallop GI: soft, non-tender; bowel sounds normal; no masses,  no organomegaly Neurologic: Awake and alert. Cranial nerves II-12 appear to be intact. Motor strength equal bilateral upper and lower extremities.  Lab Results:  Basic Metabolic Panel:  Recent Labs Lab 06/02/15 1510 06/02/15 1522 06/03/15 0808 06/04/15 0511 06/05/15 0252  NA 139 138 139 141 141  K 3.6 3.5 3.2* 3.8 3.4*  CL 99* 96* 99* 101 102  CO2 28  --  29 29 28   GLUCOSE 134* 132* 176* 110* 125*  BUN 34* 36* 33* 45* 35*  CREATININE 1.76* 1.60* 1.86* 2.16* 1.73*  CALCIUM 10.7*  --  9.6 9.4 9.4   Liver Function Tests:  Recent Labs Lab 06/02/15 1510 06/03/15 0808  AST 24 18  ALT 20 17  ALKPHOS 80 69  BILITOT 0.5 0.5  PROT 6.9 5.7*  ALBUMIN 3.7 3.0*   CBC:  Recent Labs Lab 06/02/15 1510 06/02/15 1522 06/03/15 0808 06/04/15 0511 06/05/15 0252  WBC 13.9*  --  9.4 9.7 9.2  NEUTROABS 9.6*  --  5.9  --   --   HGB 13.7 16.0* 12.2 12.2 11.5*  HCT 43.2 47.0* 38.9 39.8 37.0  MCV 90.6  --  90.0 90.7 90.7  PLT 223  --  209 188 196   CBG:  Recent Labs Lab 06/02/15 1518 06/02/15 2133 06/03/15 0605  GLUCAP 130* 121* 142*      Studies/Results: No results found.  Medications:  Scheduled: .  stroke: mapping our early stages of recovery book    Does not apply Once  . acetaminophen  1,000 mg Oral QHS  . atorvastatin  40 mg Oral q1800  . clopidogrel  75 mg Oral Once  . enoxaparin (LOVENOX) injection  30 mg Subcutaneous Q24H  . Liraglutide  1.8 mg Subcutaneous QHS  . multivitamin with minerals  1 tablet Oral QHS  . pantoprazole  40 mg Oral Daily  . pioglitazone  15 mg Oral Daily  . potassium chloride  20 mEq Oral Once  . sodium chloride  250 mL Intravenous Once  . sodium chloride flush  3 mL Intravenous Q12H  . traZODone  100 mg Oral QHS  . venlafaxine XR  75 mg Oral Q breakfast   Continuous: . sodium chloride     [id number]  Assessment/Plan:  Principal Problem:   Acute CVA (cerebrovascular accident) ([place]) Active Problems:   Hypertension   Hyperlipidemia   CKD (chronic kidney disease) stage 4, GFR 15-29 ml/min (HCC)   DM type 2 (diabetes mellitus, type 2) (HCC)   Leukocytosis   Cerebral infarction due to unspecified mechanism   ARF (acute renal failure) (HCC)    Acute stroke involving the right occipital lobe Patient does have visual impairment. Neurology has been consulted. Patient has been placed on Plavix. She was taking aspirin at home. She is on a statin. PT and OT evaluation. Speech therapy evaluation. Echocardiogram report as above. Carotid Doppler is As above. LDL is 81. Plan is for TEE during this hospitalization.  Acute on Chronic kidney disease stage IV BUN and creatinine have improved. Continue IV fluids for now. Monitor urine output. No recent labs available except for this admission. Creatinine was 1.18 in 2012. Avoid nephrotoxic agents. Repeat labs tomorrow morning. Renal ultrasound can be considered as outpatient.  Essential hypertension Blood pressure is stable. Love permissive hypertension. Monitor blood pressures closely.  Diabetes mellitus type II Monitor CBGs. She is on oral agents at home, which are being continued. HbA1c is 7.7.  Hyperlipidemia Continue  atorvastatin.  Leukocytosis WBC was elevated, likely due to stress. No obvious source of infection. Subsequently normal.   DVT Prophylaxis: Lovenox    Code Status: Full code  Family Communication: Discussed with the patient  Disposition Plan: Plan is for TEE during this hospitalization. Discussed with Dr. Erlinda Hong with neurology.    LOS: 3 days   [facility] Pager [phone] 06/05/2015, 8:53 AM  If 7PM-7AM, please contact night-coverage at [email or web address], password [facility]

## 2015-06-05 NOTE — Progress Notes (Signed)
Occupational Therapy Treatment/Discharge Patient Details Name: Amy Wiggins MRN: [id number] DOB: 02-18-1932 Today's Date: 06/05/2015    History of present illness Pt is an 82 y/o female who presents with visual changes. MRI revealed acute nonhemorrhagic infarct present in the medial right occipital lobe.   OT comments  Pt demonstrating good use of visual scanning strategies to compensate for L visual field loss. Pt completed all ADLs and mobility at mod I level. Educated pt on home safety and fall prevention strategies related to visual changes. All education has been completed and pt has no further questions. Continue to recommend neuro OP OT for vision. Pt with no further acute OT needs. OT signing off.   Follow Up Recommendations  Neuro Outpatient OT    Equipment Recommendations  None recommended by OT    Recommendations for Other Services      Precautions / Restrictions Precautions Precautions: Fall Precaution Comments: L visual field cut Restrictions Weight Bearing Restrictions: No       Mobility Bed Mobility Overal bed mobility: Modified Independent             General bed mobility comments: Pt sitting up in recliner upon PT arrival.   Transfers Overall transfer level: Modified independent Equipment used: None Transfers: Sit to/from Stand Sit to Stand: Modified independent (Device/Increase time)         General transfer comment: No assist required, and no unsteadiness noted.     Balance Overall balance assessment: Needs assistance Sitting-balance support: No upper extremity supported;Feet supported Sitting balance-Leahy Scale: Good     Standing balance support: No upper extremity supported;During functional activity Standing balance-Leahy Scale: Good Standing balance comment: Reaching out for furniture at times when ambulating                   ADL Overall ADL's : Modified independent                                        General ADL Comments: Pt demonstrating good use of visual scanning strategies to compensate for L visual field loss      Vision                 Additional Comments: significant L visual field loss   Perception     Praxis      Cognition   Behavior During Therapy: WFL for tasks assessed/performed Overall Cognitive Status: Within Functional Limits for tasks assessed       Memory: Decreased recall of precautions;Decreased short-term memory               Extremity/Trunk Assessment               Exercises     Shoulder Instructions       General Comments      Pertinent Vitals/ Pain       Pain Assessment: No/denies pain  Home Living                                          Prior Functioning/Environment              Frequency       Progress Toward Goals  OT Goals(current goals can now be found in the care plan section)  Progress towards OT goals: Goals met/education  completed, patient discharged from OT  Acute Rehab OT Goals Patient Stated Goal: Home as soon as able OT Goal Formulation: With patient Time For Goal Achievement: 06/17/15 Potential to Achieve Goals: Good ADL Goals Pt Will Perform Tub/Shower Transfer: Tub transfer;with modified independence;ambulating;shower seat Additional ADL Goal #1: Pt will demonstrate use of L visual scanning strategies to increase safety with ADLs and mobility due to L visual field loss.  Plan All goals met and education completed, patient discharged from OT services    Co-evaluation                 End of Session Equipment Utilized During Treatment: Gait belt   Activity Tolerance Patient tolerated treatment well   Patient Left in chair;with call bell/phone within reach;with family/visitor present   Nurse Communication Mobility status        Time: 1125-1141 OT Time Calculation (min): 16 min  Charges: OT General Charges $OT Visit: 1 Procedure OT Treatments $Self  Care/Home Management : 8-22 mins  Redmond Baseman , OTR/L Pager: [phone]

## 2015-06-05 NOTE — Care Management Important Message (Signed)
Important Message  Patient Details  Name: Amy Wiggins MRN: [id number] Date of Birth: May 23, 1931   Medicare Important Message Given:  Yes    Nathen May 06/05/2015, 12:43 PM

## 2015-06-05 NOTE — Progress Notes (Signed)
Physical Therapy Progress Note    06/05/15 0958  PT Visit Information  Last PT Received On 06/05/15  Assistance Needed +1  History of Present Illness Pt is an 82 y/o female who presents with visual changes. MRI revealed acute nonhemorrhagic infarct present in the medial right occipital lobe.  PT Time Calculation  PT Start Time (ACUTE ONLY) 0813  PT Stop Time (ACUTE ONLY) 0828  PT Time Calculation (min) (ACUTE ONLY) 15 min  Subjective Data  Patient Stated Goal Home as soon as able  Precautions  Precautions Fall  Precaution Comments L visual changes  Restrictions  Weight Bearing Restrictions No  Pain Assessment  Pain Assessment No/denies pain  Cognition  Arousal/Alertness Awake/alert  Behavior During Therapy WFL for tasks assessed/performed  Overall Cognitive Status Within Functional Limits for tasks assessed  Memory Decreased recall of precautions;Decreased short-term memory  Bed Mobility  General bed mobility comments Pt sitting up in recliner upon PT arrival.   Transfers  Overall transfer level Needs assistance  Equipment used None  Transfers Sit to/from Stand  Sit to Stand Modified independent (Device/Increase time)  General transfer comment No assist required, and no unsteadiness noted.   Ambulation/Gait  Ambulation/Gait assistance Supervision  Ambulation Distance (Feet) 500 Feet  Assistive device None  Gait Pattern/deviations Step-through pattern;Decreased stride length;Trunk flexed  General Gait Details Somewhat decreased gait speed but fairly steady. Pt reports increased dizziness with head turn activity and has difficulty turning her head L unless cued. Pt was educated on scanning ahead and cued for reading signs on the left side out loud as we passed.   Gait velocity Decreased  Gait velocity interpretation Below normal speed for age/gender  Stairs assistance Min guard  General stair comments VC's for sequencing and safety awareness. No assist required however close  guard provided for safety.   Modified Rankin (Stroke Patients Only)  Pre-Morbid Rankin Score 0  Modified Rankin 2  Balance  Overall balance assessment Needs assistance  Sitting-balance support Feet supported;No upper extremity supported  Sitting balance-Leahy Scale Good  Standing balance support No upper extremity supported;During functional activity  Standing balance-Leahy Scale Fair  Exercises  Exercises General Lower Extremity;Other exercises  General Exercises - Lower Extremity  Hip ABduction/ADduction 5 reps;Standing  Straight Leg Raises 5 reps;Standing  Mini-Sqauts 5 reps;Standing  Other Exercises  Other Exercises standing hip extension and hamstring curls, x5 each  PT - End of Session  Equipment Utilized During Treatment Gait belt  Activity Tolerance Patient tolerated treatment well  Patient left in chair;with call bell/phone within reach  Nurse Communication Mobility status  PT - Assessment/Plan  PT Plan Current plan remains appropriate  PT Frequency (ACUTE ONLY) Min 5X/week  Follow Up Recommendations Other (comment) (Outpatient therapy for vision - PT vs OT)  PT equipment None recommended by PT  PT Goal Progression  Progress towards PT goals Progressing toward goals  Acute Rehab PT Goals  PT Goal Formulation With patient  Time For Goal Achievement 06/10/15  Potential to Achieve Goals Good  PT General Charges  $$ ACUTE PT VISIT 1 Procedure  PT Treatments  $Gait Training 8-22 mins   PT Assessment: Pt progressing towards physical therapy goals. Continues to require cues for safety and higher level cognitive tasks during mobility due to L side inattention. Pt was instructed in HEP and was able to perform ~5 reps of each during education.   Rolinda Roan, PT, DPT Acute Rehabilitation Services Pager: [phone]

## 2015-06-06 DIAGNOSIS — E1159 Type 2 diabetes mellitus with other circulatory complications: Secondary | ICD-10-CM

## 2015-06-06 LAB — BASIC METABOLIC PANEL
Anion gap: 11 (ref 5–15)
BUN: 31 mg/dL — AB (ref 6–20)
CALCIUM: 9.5 mg/dL (ref 8.9–10.3)
CO2: 26 mmol/L (ref 22–32)
CREATININE: 1.55 mg/dL — AB (ref 0.44–1.00)
Chloride: 103 mmol/L (ref 101–111)
GFR calc Af Amer: 35 mL/min — ABNORMAL LOW (ref >60)
GFR, EST NON AFRICAN AMERICAN: 30 mL/min — AB (ref >60)
GLUCOSE: 142 mg/dL — AB (ref 65–99)
POTASSIUM: 3.4 mmol/L — AB (ref 3.5–5.1)
SODIUM: 140 mmol/L (ref 135–145)

## 2015-06-06 LAB — CBC
HCT: 36.4 % (ref 36.0–46.0)
Hemoglobin: 11 g/dL — ABNORMAL LOW (ref 12.0–15.0)
MCH: 27.5 pg (ref 26.0–34.0)
MCHC: 30.2 g/dL (ref 30.0–36.0)
MCV: 91 fL (ref 78.0–100.0)
PLATELETS: 190 10*3/uL (ref 150–400)
RBC: 4 MIL/uL (ref 3.87–5.11)
RDW: 13.7 % (ref 11.5–15.5)
WBC: 8.6 10*3/uL (ref 4.0–10.5)

## 2015-06-06 LAB — GLUCOSE, CAPILLARY
GLUCOSE-CAPILLARY: 159 mg/dL — AB (ref 65–99)
Glucose-Capillary: 79 mg/dL (ref 65–99)

## 2015-06-06 MED ORDER — POTASSIUM CHLORIDE CRYS ER 20 MEQ PO TBCR
20.0000 meq | EXTENDED_RELEASE_TABLET | Freq: Once | ORAL | Status: AC
  Administered 2015-06-06: 20 meq via ORAL
  Filled 2015-06-06: qty 1

## 2015-06-06 MED ORDER — LIRAGLUTIDE 18 MG/3ML ~~LOC~~ SOPN: 1.8000 mg | PEN_INJECTOR | Freq: Every day | SUBCUTANEOUS | Status: DC

## 2015-06-06 MED ORDER — INSULIN ASPART 100 UNIT/ML ~~LOC~~ SOLN
0.0000 [IU] | Freq: Three times a day (TID) | SUBCUTANEOUS | Status: DC
  Administered 2015-06-06 – 2015-06-07 (×2): 2 [IU] via SUBCUTANEOUS
  Administered 2015-06-07: 1 [IU] via SUBCUTANEOUS

## 2015-06-06 MED ORDER — LORAZEPAM 1 MG PO TABS
1.0000 mg | ORAL_TABLET | Freq: Once | ORAL | Status: AC
  Administered 2015-06-06: 1 mg via ORAL
  Filled 2015-06-06: qty 1

## 2015-06-06 NOTE — Progress Notes (Signed)
[facility] PROGRESS NOTE  Amy Wiggins [id number] DOB: Aug 02, 1931 DOA: 06/02/2015  PCP: Asencion Noble, MD  Brief HPI: 82 year old female with a past medical history of hypertension, diabetes, TIA, presented with vision changes for the past many days. She was found to have right occipital acute stroke. She was hospitalized for further management. Patient was found to have an acute stroke. Neurology has been following.  Past medical history:  Past Medical History  Diagnosis Date  . Hypertension   . Diabetes mellitus   . Stroke ([place])     TIA's x 2    Consultants: Neurology  Procedures:  Transthoracic echocardiogram Study Conclusions - Left ventricle: The cavity size was normal. There was moderate concentric hypertrophy. Systolic function was vigorous. The estimated ejection fraction was in the range of 65% to 70%. Wall motion was normal; there were no regional wall motion abnormalities. There was an increased relative contribution of atrial contraction to ventricular filling. Doppler parameters are consistent with abnormal left ventricular relaxation (grade 1 diastolic dysfunction). Doppler parameters are consistent with high ventricular filling pressure. - Aortic valve: Moderate diffuse thickening and calcification, consistent with sclerosis. - Mitral valve: Calcified annulus. Mild diffuse thickening and calcification of the anterior leaflet and posterior leaflet.  Carotid Doppler 1-39% ICA plaquing. Vertebral artery flow is antegrade.   Lower extremity venous Dopplers There is no DVT or SVT noted in the bilateral lower extremities.   TEE is planned for 4/12  Antibiotics: None  Subjective: Patient feels well. She continues to have visual impairment. Mainly involving the peripheral vision on the right. Denies any weakness in her arms or legs. Continues to have a mild headache in the back of her head.   Objective: Vital Signs  Filed Vitals:   06/05/15 1810  06/05/15 2117 06/06/15 0138 06/06/15 0528  BP: 129/61 154/69 134/59 148/73  Pulse: 97 100 100 95  Temp: 98.4 F (36.9 C) 98.3 F (36.8 C) 97.9 F (36.6 C) 97.9 F (36.6 C)  TempSrc: Oral Oral Oral Oral  Resp: 20 20 20 20   Height:      Weight:      SpO2: 96% 96% 93% 91%    Intake/Output Summary (Last 24 hours) at 06/06/15 0824 Last data filed at 06/05/15 1319  Gross per 24 hour  Intake    480 ml  Output      0 ml  Net    480 ml   Filed Weights   06/02/15 1520 06/02/15 2030  Weight: 94.575 kg (208 lb 8 oz) 93.441 kg (206 lb)    General appearance: alert, cooperative Resp: clear to auscultation bilaterally. No crackles or wheezing. Cardio: regular rate and rhythm, S1, S2 normal, no murmur, click, rub or gallop GI: soft, non-tender; bowel sounds normal; no masses,  no organomegaly Neurologic: Awake and alert. Cranial nerves II-12 appear to be intact. Motor strength equal bilateral upper and lower extremities.  Lab Results:  Basic Metabolic Panel:  Recent Labs Lab 06/02/15 1510 06/02/15 1522 06/03/15 0808 06/04/15 0511 06/05/15 0252 06/06/15 0324  NA 139 138 139 141 141 140  K 3.6 3.5 3.2* 3.8 3.4* 3.4*  CL 99* 96* 99* 101 102 103  CO2 28  --  29 29 28 26   GLUCOSE 134* 132* 176* 110* 125* 142*  BUN 34* 36* 33* 45* 35* 31*  CREATININE 1.76* 1.60* 1.86* 2.16* 1.73* 1.55*  CALCIUM 10.7*  --  9.6 9.4 9.4 9.5   Liver Function Tests:  Recent Labs Lab 06/02/15 1510 06/03/15  0808  AST 24 18  ALT 20 17  ALKPHOS 80 69  BILITOT 0.5 0.5  PROT 6.9 5.7*  ALBUMIN 3.7 3.0*   CBC:  Recent Labs Lab 06/02/15 1510 06/02/15 1522 06/03/15 0808 06/04/15 0511 06/05/15 0252 06/06/15 0324  WBC 13.9*  --  9.4 9.7 9.2 8.6  NEUTROABS 9.6*  --  5.9  --   --   --   HGB 13.7 16.0* 12.2 12.2 11.5* 11.0*  HCT 43.2 47.0* 38.9 39.8 37.0 36.4  MCV 90.6  --  90.0 90.7 90.7 91.0  PLT 223  --  209 188 196 190   CBG:  Recent Labs Lab 06/02/15 1518 06/02/15 2133  06/03/15 0605  GLUCAP 130* 121* 142*      Studies/Results: No results found.  Medications:  Scheduled: . acetaminophen  1,000 mg Oral QHS  . atorvastatin  40 mg Oral q1800  . clopidogrel  75 mg Oral Once  . enoxaparin (LOVENOX) injection  30 mg Subcutaneous Q24H  . Liraglutide  1.8 mg Subcutaneous QHS  . multivitamin with minerals  1 tablet Oral QHS  . pantoprazole  40 mg Oral Daily  . pioglitazone  15 mg Oral Daily  . potassium chloride  20 mEq Oral Once  . sodium chloride  250 mL Intravenous Once  . sodium chloride flush  3 mL Intravenous Q12H  . traZODone  100 mg Oral QHS  . venlafaxine XR  75 mg Oral Q breakfast   Continuous: . sodium chloride 75 mL/hr (06/06/15 0029)   [id number], senna-docusate  Assessment/Plan:  Principal Problem:   Acute CVA (cerebrovascular accident) ([place]) Active Problems:   Hypertension   Hyperlipidemia   CKD (chronic kidney disease) stage 4, GFR 15-29 ml/min (H[facility])   DM type 2 (diabetes mellitus, type 2) (HCC)   Leukocytosis   Cerebral infarction due to unspecified mechanism   ARF (acute renal failure) (HCC)    Acute stroke involving the right occipital lobe Patient Continues to have visual impairment. Neurology is following. Patient has been placed on Plavix. She was taking aspirin at home. She is on a statin. PT and OT evaluation has been completed. Will need outpatient OT. Speech therapy evaluation completed. Echocardiogram report as above. Carotid Doppler is As above. No DVT on venous Dopplers. LDL is 81. Plan is for TEE 4/12.  Acute on Chronic kidney disease stage IV Patient's renal function has improved. Okay to discontinue IV fluids. Continue to monitor urine output. No recent labs available except for this admission. Creatinine was 1.18 in 2012. Avoid nephrotoxic agents. Renal ultrasound can be considered as outpatient.  Essential hypertension Blood pressure is stable. Allow permissive hypertension. Monitor blood pressures  closely.  Diabetes mellitus type II Monitor CBGs. She is on oral agents at home, which are being continued. HbA1c is 7.7. Oral agents will be held, as she will be nothing by mouth past midnight.  Hyperlipidemia Continue atorvastatin.  Leukocytosis WBC was elevated, likely due to stress. No obvious source of infection. Subsequently normal.   DVT Prophylaxis: Lovenox    Code Status: Full code  Family Communication: Discussed with the patient  Disposition Plan: TEE 4/12. Anticipate discharge following TEE.    LOS: 4 days   [facility] Pager [phone] 06/06/2015, 8:24 AM  If 7PM-7AM, please contact night-coverage at [email or web address], password [facility]

## 2015-06-06 NOTE — Progress Notes (Signed)
    [facility] has been requested to perform a transesophageal echocardiogram on Amy Wiggins for Stroke.  After careful review of history and examination, the risks and benefits of transesophageal echocardiogram have been explained including risks of esophageal damage, perforation (1:10,000 risk), bleeding, pharyngeal hematoma as well as other potential complications associated with conscious sedation including aspiration, arrhythmia, respiratory failure and death. Alternatives to treatment were discussed, questions were answered. Patient is willing to proceed.  TEE - Dr. Acie Fredrickson @ 1100 . NPO after midnight. Meds with sips.   Viraat Vanpatten, PA-C 06/06/2015 10:28 AM

## 2015-06-06 NOTE — Progress Notes (Signed)
STROKE TEAM PROGRESS NOTE   SUBJECTIVE (INTERVAL HISTORY)  Patient sitting up in the chair. No complaints. Awaiting test for tomorrow. Patient continues to share she has history of palpitations.   OBJECTIVE Temp:  [97.9 F (36.6 C)-98.5 F (36.9 C)] 98.3 F (36.8 C) (04/11 1008) Pulse Rate:  [95-100] 100 (04/11 1008) Cardiac Rhythm:  [-] Heart block (04/11 0700) Resp:  [20] 20 (04/11 1008) BP: (122-154)/(51-73) 122/51 mmHg (04/11 1008) SpO2:  [91 %-98 %] 98 % (04/11 1008)  CBC:  Recent Labs Lab 06/02/15 1510  06/03/15 0808  06/05/15 0252 06/06/15 0324  WBC 13.9*  --  9.4  < > 9.2 8.6  NEUTROABS 9.6*  --  5.9  --   --   --   HGB 13.7  < > 12.2  < > 11.5* 11.0*  HCT 43.2  < > 38.9  < > 37.0 36.4  MCV 90.6  --  90.0  < > 90.7 91.0  PLT 223  --  209  < > 196 190  < > = values in this interval not displayed.  Basic Metabolic Panel:   Recent Labs Lab 06/05/15 0252 06/06/15 0324  NA 141 140  K 3.4* 3.4*  CL 102 103  CO2 28 26  GLUCOSE 125* 142*  BUN 35* 31*  CREATININE 1.73* 1.55*  CALCIUM 9.4 9.5   Lipid Panel:     Component Value Date/Time   CHOL 198 06/03/2015 0808   TRIG 373* 06/03/2015 0808   HDL 42 06/03/2015 0808   CHOLHDL 4.7 06/03/2015 0808   VLDL 75* 06/03/2015 0808   LDLCALC 81 06/03/2015 0808   HgbA1c:  Lab Results  Component Value Date   HGBA1C 7.7* 06/03/2015   Urine Drug Screen: No results found for: LABOPIA, COCAINSCRNUR, LABBENZ, AMPHETMU, THCU, LABBARB    IMAGING I have personally reviewed the radiological images below and agree with the radiology interpretations.  Dg Chest 2 View 06/02/2015   No active cardiopulmonary disease.   Ct Head Wo Contrast 06/02/2015   1. Acute nonhemorrhagic stroke involving the right occipital lobe.  2. Remote stroke involving the subcortical white matter of the right posterior parietal lobe as noted on the MRI in October, 2012.   Mri & Mra Head and Mra Neck Wo Contrast 06/02/2015   1. Acute  nonhemorrhagic infarct within the medial right occipital lobe measures 2.5 cm maximally.  2. Remote lacunar infarcts of the basal ganglia and cerebellum bilaterally.  3. Mild age advanced atrophy and white matter disease.  4. Intracranial atherosclerotic changes with mild distal branch vessel irregularity. There is no significant proximal stenosis, aneurysm, or branch vessel occlusion.  5. Mild atherosclerotic changes at the carotid bifurcations bilaterally without a significant stenosis.  6. Flow is antegrade in the vertebral arteries.   Carotid Doppler   There is 1-39% bilateral ICA stenosis. Vertebral artery flow is antegrade.    LE venous doppler - negative for DVT  2D echo  - Left ventricle: The cavity size was normal. There was moderate concentric hypertrophy. Systolic function was vigorous. The estimated ejection fraction was in the range of 65% to 70%. Wall motion was normal; there were no regional wall motion abnormalities. There was an increased relative contribution of atrial contraction to ventricular filling. Doppler parameters are consistent with abnormal left ventricular relaxation (grade 1 diastolic dysfunction). Doppler parameters are consistent with high ventricular filling pressure. - Aortic valve: Moderate diffuse thickening and calcification, consistent with sclerosis. - Mitral valve: Calcified annulus. Mild diffuse thickening  and calcification of the anterior leaflet and posterior leaflet.   PHYSICAL EXAM Exam: Gen: NAD Eyes: anicteric sclerae, moist conjunctivae                    CV: no MRG, no carotid bruits, no peripheral edema Mental Status: Alert, follows commands, good historian, oriented to self, date, month, year, situation. Neuro:   Detailed Neurologic Exam Speech:    No aphasia, no dysarthria Cranial Nerves:    The pupils are equal, round, and reactive to light.. Attempted, Fundi not visualized.  EOMI. conjugate midline gaze. She has left homonymous  hemianopia, lower quadrant more severe than upper quandrant.  Face symmetric, Tongue midline. Hearing intact to voice. Shoulder shrug intact Motor Observation:    no involuntary movements noted. Tone appears normal.  Strength:    5/5 Sensation:    Intact to LT symmetrically Plantars equivocal.    ASSESSMENT/PLAN Amy Wiggins is a 82 y.o. female with history of hypertension, hyperlipidemia, chronic kidney disease, diabetes mellitus, and previous TIAs presenting with visual changes.  She did not receive IV t-PA due to late presentation.  Stroke:  Right PCA infarcts, possible embolic from an unknown source.  Resultant  left homonymous visual deficits, lower quadrant more than upper quadrant  MRI  Acute infarct at the medial right occipital lobe.   MRA  no significant stenosis or occlusion.  Carotid Doppler No significant stenosis   2D Echo EF 65-70%. No cardiac source of emboli identified.  LE venous doppler - no DVT  TEE and Loop scheduled for Wed due to scheduling difficulties. Made NPO after midnight Wed night for TEE Wed. Patient reports palpitations 2-3 x/day  LDL - 81  HgbA1c 7.7  VTE prophylaxis - Lovenox Diet Carb Modified Fluid consistency:: Thin; Room service appropriate?: Yes Diet NPO time specified  aspirin 81 mg daily prior to admission, now on clopidogrel 75 mg daily  Patient counseled to be compliant with her antithrombotic medications  Ongoing aggressive stroke risk factor management  Therapy recommendations:  Outpatient therapy for visual changes - PT vs OT , OT recommends OT  Patient advised no driving at discharge  Disposition: return home  Hypertension  Blood stable  Hyperlipidemia  Home meds:  Lipitor 20 mg daily  LDL 81, goal < 70  Lipitor increased to 40 mg daily  Continue statin at discharge  Diabetes  HgbA1c 7.7, goal < 7.0  Not in good controlled  On liraglutide and actos   Follow up with PCP closely  Other  Stroke Risk Factors  Advanced age  Obesity, Body mass index is 36.5 kg/(m^2).   Hx stroke/TIA  Other Active Problems  Mild leukocytosis  Chronic kidney disease - Cre improving 2.16->1.55. On NS IV at 75 cc / hr   Overall, patient remained stable. Awaiting testing scheduled for Wednesday.  Hospital day # [place] for Pager information 06/06/2015 1:12 PM   I, the attending vascular neurologist, have personally obtained a history, examined the patient, evaluated laboratory data, individually viewed imaging studies and agree with radiology interpretations. Together with the NP/PA, we formulated the assessment and plan of care which reflects our mutual decision.  I have made any additions or clarifications directly to the above note and agree with the findings and plan as currently documented.   Pt is waiting for TEE and possible loop tomorrow. No complains except continue visual deficit and heart palpitation hx at home. Continue plavix and  statin.   Rosalin Hawking, MD PhD Stroke Neurology 06/06/2015 5:35 PM       To contact Stroke Continuity provider, please refer to [email or web address] After hours, contact General Neurology

## 2015-06-07 ENCOUNTER — Encounter (HOSPITAL_COMMUNITY)
Admission: EM | Disposition: A | Discharge status: Home Health | Payer: Self-pay | Source: Home / Self Care | Attending: Internal Medicine

## 2015-06-07 ENCOUNTER — Ambulatory Visit (HOSPITAL_COMMUNITY): Admit: 2015-06-07 | Payer: Self-pay | Admitting: Internal Medicine

## 2015-06-07 ENCOUNTER — Other Ambulatory Visit: Payer: Self-pay | Admitting: Neurology

## 2015-06-07 ENCOUNTER — Encounter (HOSPITAL_COMMUNITY): Payer: Self-pay | Admitting: *Deleted

## 2015-06-07 ENCOUNTER — Inpatient Hospital Stay (HOSPITAL_COMMUNITY): Payer: Medicare Other

## 2015-06-07 DIAGNOSIS — I639 Cerebral infarction, unspecified: Secondary | ICD-10-CM

## 2015-06-07 DIAGNOSIS — N178 Other acute kidney failure: Secondary | ICD-10-CM

## 2015-06-07 DIAGNOSIS — I340 Nonrheumatic mitral (valve) insufficiency: Secondary | ICD-10-CM

## 2015-06-07 DIAGNOSIS — Q211 Atrial septal defect: Secondary | ICD-10-CM | POA: Insufficient documentation

## 2015-06-07 DIAGNOSIS — Q2112 Patent foramen ovale: Secondary | ICD-10-CM | POA: Insufficient documentation

## 2015-06-07 HISTORY — PX: TEE WITHOUT CARDIOVERSION: SHX5443

## 2015-06-07 HISTORY — PX: EP IMPLANTABLE DEVICE: SHX172B

## 2015-06-07 LAB — BASIC METABOLIC PANEL
Anion gap: 10 (ref 5–15)
BUN: 22 mg/dL — AB (ref 6–20)
CALCIUM: 9.7 mg/dL (ref 8.9–10.3)
CO2: 29 mmol/L (ref 22–32)
CREATININE: 1.48 mg/dL — AB (ref 0.44–1.00)
Chloride: 103 mmol/L (ref 101–111)
GFR calc non Af Amer: 32 mL/min — ABNORMAL LOW (ref >60)
GFR, EST AFRICAN AMERICAN: 37 mL/min — AB (ref >60)
Glucose, Bld: 156 mg/dL — ABNORMAL HIGH (ref 65–99)
Potassium: 4 mmol/L (ref 3.5–5.1)
SODIUM: 142 mmol/L (ref 135–145)

## 2015-06-07 LAB — GLUCOSE, CAPILLARY
GLUCOSE-CAPILLARY: 185 mg/dL — AB (ref 65–99)
Glucose-Capillary: 141 mg/dL — ABNORMAL HIGH (ref 65–99)

## 2015-06-07 LAB — MAGNESIUM: MAGNESIUM: 1.6 mg/dL — AB (ref 1.7–2.4)

## 2015-06-07 SURGERY — LOOP RECORDER INSERTION

## 2015-06-07 SURGERY — ECHOCARDIOGRAM, TRANSESOPHAGEAL
Anesthesia: Moderate Sedation

## 2015-06-07 MED ORDER — FENTANYL CITRATE (PF) 100 MCG/2ML IJ SOLN
INTRAMUSCULAR | Status: DC | PRN
  Administered 2015-06-07 (×3): 25 ug via INTRAVENOUS

## 2015-06-07 MED ORDER — BUTAMBEN-TETRACAINE-BENZOCAINE 2-2-14 % EX AERO
INHALATION_SPRAY | CUTANEOUS | Status: DC | PRN
  Administered 2015-06-07: 2 via TOPICAL

## 2015-06-07 MED ORDER — MAGNESIUM OXIDE 400 (241.3 MG) MG PO TABS: 400.0000 mg | ORAL_TABLET | Freq: Two times a day (BID) | ORAL | Status: DC

## 2015-06-07 MED ORDER — CLOPIDOGREL BISULFATE 75 MG PO TABS: 75.0000 mg | ORAL_TABLET | Freq: Once | ORAL | Status: DC

## 2015-06-07 MED ORDER — LIDOCAINE-EPINEPHRINE 1 %-1:100000 IJ SOLN
INTRAMUSCULAR | Status: DC | PRN
  Administered 2015-06-07: 15 mL

## 2015-06-07 MED ORDER — MIDAZOLAM HCL 10 MG/2ML IJ SOLN
INTRAMUSCULAR | Status: DC | PRN
Start: 2015-06-07 — End: 2015-06-07
  Administered 2015-06-07 (×3): 1 mg via INTRAVENOUS
  Administered 2015-06-07: 2 mg via INTRAVENOUS

## 2015-06-07 MED ORDER — SODIUM CHLORIDE 0.9 % IV SOLN
INTRAVENOUS | Status: DC
  Administered 2015-06-07: 500 mL via INTRAVENOUS

## 2015-06-07 MED ORDER — MIDAZOLAM HCL 5 MG/ML IJ SOLN
INTRAMUSCULAR | Status: AC
  Filled 2015-06-07: qty 2

## 2015-06-07 MED ORDER — FENTANYL CITRATE (PF) 100 MCG/2ML IJ SOLN
INTRAMUSCULAR | Status: AC
Start: 2015-06-07 — End: 2015-06-07
  Filled 2015-06-07: qty 2

## 2015-06-07 MED ORDER — LIDOCAINE-EPINEPHRINE 1 %-1:100000 IJ SOLN
INTRAMUSCULAR | Status: AC
  Filled 2015-06-07: qty 1

## 2015-06-07 SURGICAL SUPPLY — 2 items
LOOP REVEAL LINQSYS (Prosthesis & Implant Heart) ×2 IMPLANT
PACK LOOP INSERTION (CUSTOM PROCEDURE TRAY) ×3 IMPLANT

## 2015-06-07 NOTE — Discharge Instructions (Signed)
Wound care instructions Keep the wound area clean and dry, do not get the area wet for 3 days Cardiac Event Monitoring A cardiac event monitor is a small recording device used to help detect abnormal heart rhythms (arrhythmias). The monitor is used to record heart rhythm when noticeable symptoms such as the following occur:  Fast heartbeats (palpitations), such as heart racing or fluttering.  Dizziness.  Fainting or light-headedness.  Unexplained weakness. The monitor is wired to two electrodes placed on your chest. Electrodes are flat, sticky disks that attach to your skin. The monitor can be worn for up to 30 days. You will wear the monitor at all times, except when bathing.  HOW TO USE YOUR CARDIAC EVENT MONITOR A technician will prepare your chest for the electrode placement. The technician will show you how to place the electrodes, how to work the monitor, and how to replace the batteries. Take time to practice using the monitor before you leave the office. Make sure you understand how to send the information from the monitor to your health care provider. This requires a telephone with a landline, not a cell phone. You need to:  Wear your monitor at all times, except when you are in water:  Do not get the monitor wet.  Take the monitor off when bathing. Do not swim or use a hot tub with it on.  Keep your skin clean. Do not put body lotion or moisturizer on your chest.  Change the electrodes daily or any time they stop sticking to your skin. You might need to use tape to keep them on.  It is possible that your skin under the electrodes could become irritated. To keep this from happening, try to put the electrodes in slightly different places on your chest. However, they must remain in the area under your left breast and in the upper right section of your chest.  Make sure the monitor is safely clipped to your clothing or in a location close to your body that your health care provider  recommends.  Press the button to record when you feel symptoms of heart trouble, such as dizziness, weakness, light-headedness, palpitations, thumping, shortness of breath, unexplained weakness, or a fluttering or racing heart. The monitor is always on and records what happened slightly before you pressed the button, so do not worry about being too late to get good information.  Keep a diary of your activities, such as walking, doing chores, and taking medicine. It is especially important to note what you were doing when you pushed the button to record your symptoms. This will help your health care provider determine what might be contributing to your symptoms. The information stored in your monitor will be reviewed by your health care provider alongside your diary entries.  Send the recorded information as recommended by your health care provider. It is important to understand that it will take some time for your health care provider to process the results.  Change the batteries as recommended by your health care provider. SEEK IMMEDIATE MEDICAL CARE IF:   You have chest pain.  You have extreme difficulty breathing or shortness of breath.  You develop a very fast heartbeat that persists.  You develop dizziness that does not go away.  You faint or constantly feel you are about to faint.   This information is not intended to replace advice given to you by your health care provider. Make sure you discuss any questions you have with your health care provider.  Document Released: 11/21/2007 Document Revised: 03/04/2014 Document Reviewed: 08/10/2012 Elsevier Interactive Patient Education 2016 [employer]. Ischemic Stroke Treated Without Warfarin An ischemic stroke (cerebrovascular accident) is the sudden death of brain tissue. It is a medical emergency. An ischemic stroke can cause permanent loss of brain function. This can cause problems with different parts of your body. CAUSES An  ischemic stroke is caused by a decrease of oxygen supply to an area of your brain. It is usually the result of a small blood clot (embolus) or collection of cholesterol or fat (plaque) that blocks blood flow in the brain. An ischemic stroke can also be caused by blocked or damaged carotid arteries. RISK FACTORS  High blood pressure (hypertension).  High cholesterol.  Diabetes mellitus.  Heart disease.  The buildup of plaque in the blood vessels (peripheral artery disease or atherosclerosis).  The buildup of plaque in the blood vessels that provide blood and oxygen to the brain (carotid artery stenosis).  An abnormal heart rhythm (atrial fibrillation).  Obesity.  Smoking cigarettes.  Taking oral contraceptives, especially in combination with using tobacco.  Physical inactivity.  A diet that is high in fats, salt (sodium), and calories.  Excessive alcohol use.  Use of illegal drugs, especially cocaine and methamphetamine.  Being African American.  Being over the age of 73 years.  Family history of stroke.  Previous history of blood clots, stroke, TIA (transient ischemic attack), or heart attack.  Sickle cell disease. SIGNS AND SYMPTOMS These symptoms usually develop suddenly, or you may notice them after waking up from sleep. Symptoms may include sudden:  Weakness or numbness in your face, arm, or leg, especially on one side of your body.  Confusion.  Trouble speaking (aphasia) or understanding speech.  Trouble seeing with one or both eyes.  Trouble walking or difficulty moving your arms or legs.  Dizziness.  Loss of balance or coordination.  Severe headache with no known cause. The headache is often described as the worst headache ever experienced. DIAGNOSIS Your health care provider can often determine the presence or absence of an ischemic stroke based on your symptoms, history, and physical exam. CT (computed tomography) of the brain is usually performed  to confirm the stroke, determine causes, and determine stroke severity. Other tests may be done to find the cause of the stroke. These tests may include:  ECG (electrocardiogram).  Continuous heart monitoring.  Echocardiogram.  Carotid ultrasound.  MRI.  A scan of the brain circulation.  Blood tests. TREATMENT It is very important to seek treatment at the first sign of stroke symptoms. Your health care provider may perform the following treatments within 6 hours of the onset of stroke symptoms:  Medicine to dissolve the blood clot (thrombolytic).  Inserting a device into the affected artery to remove the blood clot. These treatments may not be effective if too much time has passed since your stroke symptoms began. Even if you do not know when your symptoms began, get treatment as soon as possible. There are other treatment options that may be given, such as:  Oxygen.  IV fluids.  Medicines to thin the blood (anticoagulants).  A procedure to widen blocked arteries. Your treatment will depend on how long you have had your symptoms, the severity of your symptoms, and the cause of your symptoms. Your health care provider will take measures to prevent short-term and long-term complications of stroke, such as:  Breathing foreign material into the lungs (aspiration pneumonia).  Blood clots in the legs.  Bedsores.  Falls. Medicines and dietary changes may be used to help treat and manage risk factors for stroke, such as diabetes and high blood pressure. If any of your body's functions were impaired by stroke, you may work with physical, speech, or occupational therapists to help you recover. HOME CARE INSTRUCTIONS  Take medicines only as directed by your health care provider. Follow the directions carefully. Medicines may be used to control risk factors for a stroke. Be sure that you understand all your medicine instructions.  If swallow studies have determined that your  swallowing reflex is present, you should eat healthy foods. Foods may need to be a soft or pureed consistency, or you may need to take small bites in order to avoid aspirating or choking.  Follow physical activity guidelines as directed by your health care team.  Do not use any tobacco products, including cigarettes, chewing tobacco, or electronic cigarettes. If you smoke, quit. If you need help quitting, ask your health care provider.  Limit or stop alcohol use.  A safe home environment is important to reduce the risk of falls. Your health care provider may arrange for specialists to evaluate your home. Having grab bars in the bedroom and bathroom is often important. Your health care provider may arrange for equipment to be used at home, such as raised toilets and a seat for the shower.  Ongoing physical, occupational, and speech therapy may be needed to maximize your recovery after a stroke. If you have been advised to use a walker or a cane, use it at all times. Be sure to keep your therapy appointments.  Keep all follow-up visits with your health care provider. This is very important. This includes any referrals, therapy, rehabilitation, and lab tests. Proper follow-up can prevent another stroke from occurring. PREVENTION The risk of a stroke can be decreased by appropriately treating high blood pressure, high cholesterol, diabetes, heart disease, and obesity. It can also be decreased by quitting smoking, limiting alcohol, and staying physically active. SEEK IMMEDIATE MEDICAL CARE IF:  You have sudden weakness or numbness in your face, arm, or leg, especially on one side of your body.  You have sudden confusion.  You have sudden trouble speaking (aphasia) or understanding.  You have sudden trouble seeing with one or both eyes.  You have sudden trouble walking or difficulty moving your arms or legs.  You have sudden dizziness.  You have a sudden loss of balance or coordination.  You  have a sudden, severe headache with no known cause.  You have a partial or total loss of consciousness. Any of these symptoms may represent a serious problem that is an emergency. Do not wait to see if the symptoms will go away. Get medical help right away. Call your local emergency services (911 in [place] Do not drive yourself to the hospital.   This information is not intended to replace advice given to you by your health care provider. Make sure you discuss any questions you have with your health care provider.   Document Released: 11/26/2013 Document Reviewed: 11/26/2013 Elsevier Interactive Patient Education [employer].

## 2015-06-07 NOTE — Progress Notes (Signed)
Pt off floor for TEE

## 2015-06-07 NOTE — Progress Notes (Signed)
Pt back on floor and in room

## 2015-06-07 NOTE — Interval H&P Note (Signed)
History and Physical Interval Note:  06/07/2015 10:05 AM  Amy Wiggins  has presented today for surgery, with the diagnosis of stroke  The various methods of treatment have been discussed with the patient and family. After consideration of risks, benefits and other options for treatment, the patient has consented to  Procedure(s): TRANSESOPHAGEAL ECHOCARDIOGRAM (TEE) (N/A) as a surgical intervention .  The patient's history has been reviewed, patient examined, no change in status, stable for surgery.  I have reviewed the patient's chart and labs.  Questions were answered to the patient's satisfaction.     Sian Joles, Wonda Cheng

## 2015-06-07 NOTE — Consult Note (Signed)
ELECTROPHYSIOLOGY CONSULT NOTE  Patient ID: Amy Wiggins MRN: [id number], DOB/AGE: May 19, 1931   Admit date: 06/02/2015 Date of Consult: 06/07/2015  Primary Physician: Asencion Noble, MD Primary Cardiologist: none Reason for Consultation: Cryptogenic stroke -06/02/15; recommendations regarding Implantable Loop Recorder  History of Present Illness Amy Wiggins was admitted on 06/02/2015 with stroke.  They first developed symptoms while at home, noting blurred vision she attributed to her BS being high or low, she saw her eye doctor the following day with persistent visual changes who referred her to the ER.  Imaging demonstrated Right PCA infarcts, possible embolic from an unknown source.  she has undergone workup for stroke including echocardiogram and carotid US.  The patient has been monitored on telemetry which has demonstrated sinus rhythm with no arrhythmias.  Inpatient stroke work-up is to be completed with a TEE later today.   PMHx includes HTN, DM, obesity, TIA x2, HLD  Echocardiogram this admission demonstrated Study Conclusions - Left ventricle: The cavity size was normal. There was moderate  concentric hypertrophy. Systolic function was vigorous. The  estimated ejection fraction was in the range of 65% to 70%. Wall  motion was normal; there were no regional wall motion  abnormalities. There was an increased relative contribution of  atrial contraction to ventricular filling. Doppler parameters are  consistent with abnormal left ventricular relaxation (grade 1  diastolic dysfunction). Doppler parameters are consistent with  high ventricular filling pressure. - Aortic valve: Moderate diffuse thickening and calcification,  consistent with sclerosis. - Mitral valve: Calcified annulus. Mild diffuse thickening and  calcification of the anterior leaflet and posterior leaflet.  Lab work is reviewed.  06/04/15: LE venous doppler/US Summary: No evidence of deep vein or  superficial thrombosis involving the right lower extremity and left lower extremity.  06/04/15: Carotid US Summary: Bilateral: mild soft plaque origin ICA. 1-39% ICA plaquing. Vertebral artery flow is antegrade.    Prior to admission, the patient denies chest pain, shortness of breath, dizziness, or syncope. She will infrequently feel a very fleeting flutter or palpitation in her chest, maybe 1-2x a month.  They are recovering from their stroke with plans to home at discharge.  EP has been asked to evaluate for placement of an implantable loop recorder to monitor for atrial fibrillation.     Past Medical History  Diagnosis Date  . Hypertension   . Diabetes mellitus   . Stroke ([place])     TIA's x 2     Surgical History:  Past Surgical History  Procedure Laterality Date  . Colonoscopy    . Colonoscopy N/A 09/09/2014    Procedure: COLONOSCOPY;  Surgeon: Rogene Houston, MD;  Location: [facility];  Service: Endoscopy;  Laterality: N/A;  1010 - moved to 7:30 - Ann to notify     Prescriptions prior to admission  Medication Sig Dispense Refill Last Dose  . acetaminophen (TYLENOL) 500 MG tablet Take 1,000 mg by mouth at bedtime.   06/01/2015 at Unknown time  . aspirin EC 81 MG tablet Take 81 mg by mouth at bedtime. As blood thinner/ for heart   06/01/2015 at Unknown time  . atorvastatin (LIPITOR) 20 MG tablet Take 20 mg by mouth daily.   06/02/2015 at Unknown time  . chlorthalidone (HYGROTON) 25 MG tablet Take 25 mg by mouth every morning. For high blood pressure/fluid   06/01/2015 at Unknown time  . glimepiride (AMARYL) 2 MG tablet Take 2 mg by mouth daily with breakfast. For Diabetes   06/01/2015  at Unknown time  . Liraglutide (VICTOZA) 18 MG/3ML SOPN Inject 1.8 mg into the skin at bedtime.    06/01/2015 at Unknown time  . losartan (COZAAR) 100 MG tablet Take 100 mg by mouth daily. For high blood pressure   06/01/2015 at Unknown time  . Multiple Vitamin (MULTIVITAMIN WITH MINERALS) TABS tablet  Take 1 tablet by mouth at bedtime. For supplement   06/02/2015 at Unknown time  . NON FORMULARY Take 1 tablet by mouth at bedtime. "leg cramp relief med"   06/01/2015 at Unknown time  . pantoprazole (PROTONIX) 40 MG tablet Take 40 mg by mouth daily. For acid reflux/gerd   06/02/2015 at Unknown time  . pioglitazone (ACTOS) 15 MG tablet Take 15 mg by mouth daily.   06/02/2015 at Unknown time  . traZODone (DESYREL) 100 MG tablet Take 100 mg by mouth at bedtime.   06/01/2015 at Unknown time  . venlafaxine XR (EFFEXOR-XR) 75 MG 24 hr capsule Take 75 mg by mouth daily with breakfast.   06/02/2015 at Unknown time  . predniSONE (DELTASONE) 10 MG tablet Take 10-50 mg by mouth daily. Reported on 06/02/2015   Completed Course at past week    Inpatient Medications:  . acetaminophen  1,000 mg Oral QHS  . atorvastatin  40 mg Oral q1800  . clopidogrel  75 mg Oral Once  . enoxaparin (LOVENOX) injection  30 mg Subcutaneous Q24H  . insulin aspart  0-9 Units Subcutaneous TID WC  . Liraglutide  1.8 mg Subcutaneous QHS  . multivitamin with minerals  1 tablet Oral QHS  . pantoprazole  40 mg Oral Daily  . sodium chloride  250 mL Intravenous Once  . sodium chloride flush  3 mL Intravenous Q12H  . traZODone  100 mg Oral QHS  . venlafaxine XR  75 mg Oral Q breakfast    Allergies: No Known Allergies  Social History   Social History  . Marital Status: Widowed    Spouse Name: N/A  . Number of Children: N/A  . Years of Education: N/A   Occupational History  . Not on file.   Social History Main Topics  . Smoking status: Never Smoker   . Smokeless tobacco: Not on file  . Alcohol Use: No  . Drug Use: No  . Sexual Activity: No   Other Topics Concern  . Not on file   Social History Narrative     History reviewed, her father died of stroke, a sister has AFib, her mother had DM    Review of Systems: All other systems reviewed and are otherwise negative except as noted above.  Physical Exam: Filed Vitals:    06/06/15 1755 06/06/15 2242 06/07/15 0139 06/07/15 0526  BP: 125/73 132/76 125/90 130/65  Pulse: 100 89 102 97  Temp: 98 F (36.7 C) 97.6 F (36.4 C) 97.9 F (36.6 C) 97.8 F (36.6 C)  TempSrc: Oral Oral Oral Oral  Resp: 20 20 18 18   Height:      Weight:      SpO2: 95% 98% 97% 100%    GEN- The patient is well appearing, alert and oriented x 3 today.   Head- normocephalic, atraumatic Eyes-  Sclera clear, conjunctiva pink Ears- hearing intact Oropharynx- clear Neck- supple Lungs- Clear to ausculation bilaterally, normal work of breathing Heart- Regular rate and rhythm, no murmurs, rubs or gallops  GI- soft, NT, ND Extremities- no clubbing, cyanosis, or edema MS- no significant deformity or atrophy Skin- no rash or lesion Psych- euthymic mood,  full affect   Labs:   Lab Results  Component Value Date   WBC 8.6 06/06/2015   HGB 11.0* 06/06/2015   HCT 36.4 06/06/2015   MCV 91.0 06/06/2015   PLT 190 06/06/2015    Recent Labs Lab 06/03/15 0808  06/06/15 0324  NA 139  < > 140  K 3.2*  < > 3.4*  CL 99*  < > 103  CO2 29  < > 26  BUN 33*  < > 31*  CREATININE 1.86*  < > 1.55*  CALCIUM 9.6  < > 9.5  PROT 5.7*  --   --   BILITOT 0.5  --   --   ALKPHOS 69  --   --   ALT 17  --   --   AST 18  --   --   GLUCOSE 176*  < > 142*  < > = values in this interval not displayed. Lab Results  Component Value Date   CKTOTAL 48 12/12/2010   CKMB 2.3 12/12/2010   TROPONINI <0.30 12/12/2010   Lab Results  Component Value Date   CHOL 198 06/03/2015   Lab Results  Component Value Date   HDL 42 06/03/2015   Lab Results  Component Value Date   LDLCALC 81 06/03/2015   Lab Results  Component Value Date   TRIG 373* 06/03/2015   Lab Results  Component Value Date   CHOLHDL 4.7 06/03/2015   No results found for: LDLDIRECT  No results found for: DDIMER   Radiology/Studies:  Dg Chest 2 View 06/02/2015  CLINICAL DATA:  Recent stroke EXAM: CHEST  2 VIEW COMPARISON:   02/28/2005 FINDINGS: Cardiac shadow is within normal limits. The lungs are well aerated bilaterally. No acute bony abnormality is seen degenerative changes about the left shoulder joint are noted. IMPRESSION: No active cardiopulmonary disease. Electronically Signed   By: Inez Catalina M.D.   On: 06/02/2015 21:25   Ct Head Wo Contrast 06/02/2015  CLINICAL DATA:  82 year old presenting with blurred vision and vertigo which began last night. Current history of hypertension and diabetes. Prior stroke. EXAM: CT HEAD WITHOUT CONTRAST TECHNIQUE: Contiguous axial images were obtained from the base of the skull through the vertex without intravenous contrast. COMPARISON:  CT head 08/15/2012, 12/12/2010 and MRI brain 12/12/2010, 10/18/2009. FINDINGS: Geographic low attenuation involving the cortex and white matter of the right occipital lobe. Remote prior stroke in the subcortical white matter of the right posterior parietal lobe. Moderate changes of small vessel disease of the white matter diffusely. Ventricular system normal in size and appearance for age. No mass lesion. No midline shift. No acute hemorrhage or hematoma. No extra-axial fluid collections. Mild changes of hyperostosis frontalis interna. No focal osseous abnormality involving the skull. Tiny mucous retention cyst or polyp in the left maxillary sinus. Remaining visualized paranasal sinuses, bilateral mastoid air cells and bilateral middle ear cavities well-aerated. Extensive bilateral carotid siphon and vertebral artery atherosclerosis. IMPRESSION: 1. Acute nonhemorrhagic stroke involving the right occipital lobe. 2. Remote stroke involving the subcortical white matter of the right posterior parietal lobe as noted on the MRI in October, 2012. I telephoned these results at the time of interpretation on 06/02/2015 at 4:34 pm to [name], a nurse in the emergency department triage, who verbally acknowledged these results. Electronically Signed   By: Evangeline Dakin  M.D.   On: 06/02/2015 16:36   Mr [name] Head Wo Contrast 06/02/2015  CLINICAL DATA:  Right-sided headache beginning yesterday. Sent by a eye doctor.  No weakness or facial droop. EXAM: MRI HEAD WITHOUT CONTRAST MRA HEAD WITHOUT CONTRAST MRA NECK WITHOUT CONTRAST TECHNIQUE: Multiplanar, multiecho pulse sequences of the brain and surrounding structures were obtained without intravenous contrast. Angiographic images of the Circle of Willis were obtained using MRA technique without intravenous contrast. Angiographic images of the neck were obtained using MRA technique without intravenous contrast. Carotid stenosis measurements (when applicable) are obtained utilizing NASCET criteria, using the distal internal carotid diameter as the denominator. COMPARISON:  CT head without contrast 06/02/2015. MRI brain 12/12/2010. FINDINGS: MRI HEAD FINDINGS Acute nonhemorrhagic infarct is present in the medial right occipital lobe measured 2.1 x 2.2 x 2.5 cm. No other focal areas of restricted diffusion are evident. T2 changes are associated with the area of acute infarction. Periventricular and subcortical T2 changes are otherwise stable. Remote lacunar infarcts are evident in the cerebellum bilaterally. There are remote lacunar infarcts in the basal ganglia bilaterally as well. MRA HEAD FINDINGS The internal carotid arteries are within normal limits from the high cervical segments through the ICA termini bilaterally. The A1 and M1 segments are normal. The anterior communicating artery is patent. There is moderate attenuation of MCA and ACA branch vessels bilaterally. The left vertebral artery is slightly dominant to the right. The PICA origins are visualized and normal. The vertebrobasilar junction is normal. The basilar artery is within normal limits. Both posterior cerebral arteries originate from the basilar tip. There is some attenuation of distal PCA branch vessels. MRA NECK FINDINGS Time-of-flight images demonstrate no  significant flow disturbance at either carotid bifurcation. Flow is antegrade in the vertebral arteries bilaterally. IMPRESSION: 1. Acute nonhemorrhagic infarct within the medial right occipital lobe measures 2.5 cm maximally. 2. Remote lacunar infarcts of the basal ganglia and cerebellum bilaterally. 3. Mild age advanced atrophy and white matter disease. 4. Intracranial atherosclerotic changes with mild distal branch vessel irregularity. There is no significant proximal stenosis, aneurysm, or branch vessel occlusion. 5. Mild atherosclerotic changes at the carotid bifurcations bilaterally without a significant stenosis. 6. Flow is antegrade in the vertebral arteries. Electronically Signed   By: San Morelle M.D.   On: 06/02/2015 20:13      12-lead ECG SR All prior EKG's in EPIC reviewed with no documented atrial fibrillation  Telemetry SR, occ APC's, infrequent VPCs, on 06/03/15, very brief 2:1 block with V rates 50's, no AF noted  Assessment and Plan:  1. Cryptogenic stroke The patient presents with cryptogenic stroke.  The patient has a TEE planned for this AM.  I spoke at length with the patient about monitoring for afib with either a 30 day event monitor or an implantable loop recorder.  Risks, benefits, and alteratives to implantable loop recorder were discussed with the patient today.   At this time, the patient is very clear in their decision to proceed with implantable loop recorder.   2. PAtent [place]  Wound care was reviewed with the patient (keep incision clean and dry for 3 days).  Wound check will be scheduled for the patient.  Please call with questions.   Renee Dyane Dustman, PA-C 06/07/2015  Pt seen and examined;  TEE >> PFO  Large  Given her advanced age, after discussion with neurology, they would like [place] to proceed with LINQ insertion for monitoring for atrial fibrillation.  I think reasonable to proceed withj loop recorder  reviweed with family and pt

## 2015-06-07 NOTE — CV Procedure (Signed)
    Transesophageal Echocardiogram Note  Amy Wiggins [id number] 08-31-1931  Procedure: Transesophageal Echocardiogram Indications: Stroke   Procedure Details Consent: Obtained Time Out: Verified patient identification, verified procedure, site/side was marked, verified correct patient position, special equipment/implants available, Radiology Safety Procedures followed,  medications/allergies/relevent history reviewed, required imaging and test results available.  Performed  Medications:  During this procedure the patient is administered a total of Versed 5  mg and Fentanyl 75  mcg  to achieve and maintain moderate conscious sedation.  The patient's heart rate, blood pressure, and oxygen saturation are monitored continuously during the procedure. The period of conscious sedation is 30  minutes, of which I was present face-to-face 100% of this time.  Left Ventrical:  Normal LV   Mitral Valve: trace - mild MR,   Calcified.   No significant stenosis   Aortic Valve: calcified.   No AS or AI  Tricuspid Valve: mild TR   Pulmonic Valve: no PI  Left Atrium/ Left atrial appendage: no thrombi  Atrial septum: aneurismal,   + PFO,   Significant right to left flow  with deep breath   Aorta: mild - moderate plaque   Complications: No apparent complications Patient did  tolerate procedure well.   Thayer Headings, Brooke Bonito., MD, [facility] 06/07/2015, 10:27 AM

## 2015-06-07 NOTE — Progress Notes (Signed)
Physical Therapy Treatment Patient Details Name: Amy Wiggins MRN: [id number] DOB: 1931-12-19 Today's Date: 06/07/2015    History of Present Illness Pt is an 82 y/o female who presents with visual changes. MRI revealed acute nonhemorrhagic infarct present in the medial right occipital lobe.    PT Comments    Pt continues to progress with gait speed and stability but gait pattern not back to baseline and pt continues to report mild dizziness with ambulation. PT will continue to follow.   Follow Up Recommendations  Other (comment);Outpatient PT (Outpatient therapy for vision - PT vs OT)     Equipment Recommendations  None recommended by PT    Recommendations for Other Services       Precautions / Restrictions Precautions Precautions: Fall Precaution Comments: L visual field cut Restrictions Weight Bearing Restrictions: No    Mobility  Bed Mobility               General bed mobility comments: pt sitting EOB upon arrival  Transfers Overall transfer level: Modified independent Equipment used: None Transfers: Sit to/from Stand Sit to Stand: Modified independent (Device/Increase time)         General transfer comment: No assist required, and no unsteadiness noted, denies dizziness. However, by the end of ambulation, admitted to mild dizziness upon returning to sitting  Ambulation/Gait Ambulation/Gait assistance: Supervision Ambulation Distance (Feet): 500 Feet Assistive device: None Gait Pattern/deviations: Wide base of support;Step-through pattern Gait velocity: WFL Gait velocity interpretation: at or above normal speed for age/gender General Gait Details: worked on changing gait speeds, quick stops, and turns as well as sideways and backwards walking. Also practiced walking very close to left wall without touching it. Noted increased lateral sway with ambulation that pt's son reports is new in her gait pattern   Stairs            Wheelchair  Mobility    Modified Rankin (Stroke Patients Only) Modified Rankin (Stroke Patients Only) Pre-Morbid Rankin Score: No symptoms Modified Rankin: Slight disability     Balance Overall balance assessment: Needs assistance Sitting-balance support: Feet supported Sitting balance-Leahy Scale: Good     Standing balance support: No upper extremity supported Standing balance-Leahy Scale: Good                      Cognition Arousal/Alertness: Awake/alert Behavior During Therapy: WFL for tasks assessed/performed Overall Cognitive Status: Within Functional Limits for tasks assessed       Memory: Decreased short-term memory              Exercises      General Comments        Pertinent Vitals/Pain Pain Assessment: No/denies pain    Home Living                      Prior Function            PT Goals (current goals can now be found in the care plan section) Acute Rehab PT Goals Patient Stated Goal: Home as soon as able PT Goal Formulation: With patient Time For Goal Achievement: 06/10/15 Potential to Achieve Goals: Good Progress towards PT goals: Progressing toward goals    Frequency  Min 3X/week    PT Plan Current plan remains appropriate;Frequency needs to be updated    Co-evaluation             End of Session Equipment Utilized During Treatment: Gait belt Activity Tolerance: Patient tolerated treatment well Patient  left: in chair;with call bell/phone within reach;with family/visitor present     Time: 1204-1218 PT Time Calculation (min) (ACUTE ONLY): 14 min  Charges:  $Gait Training: 8-22 mins                    G Codes:     Leighton Roach, PT  Acute Rehab Services  [phone]  Leighton Roach 06/07/2015, 1:18 PM

## 2015-06-07 NOTE — Progress Notes (Signed)
STROKE TEAM PROGRESS NOTE   SUBJECTIVE (INTERVAL HISTORY)  Patient sitting up in the chair. TEE done showed unremarkable except PFO. Will need loop recorder.    OBJECTIVE Temp:  [97.8 F (36.6 C)-98.1 F (36.7 C)] 98.1 F (36.7 C) (04/12 1513) Pulse Rate:  [91-104] 104 (04/12 1513) Cardiac Rhythm:  [-] Heart block (04/12 0700) Resp:  [9-20] 18 (04/12 1513) BP: (125-177)/(65-106) 164/74 mmHg (04/12 1513) SpO2:  [93 %-100 %] 98 % (04/12 1513)  CBC:  Recent Labs Lab 06/02/15 1510  06/03/15 0808  06/05/15 0252 06/06/15 0324  WBC 13.9*  --  9.4  < > 9.2 8.6  NEUTROABS 9.6*  --  5.9  --   --   --   HGB 13.7  < > 12.2  < > 11.5* 11.0*  HCT 43.2  < > 38.9  < > 37.0 36.4  MCV 90.6  --  90.0  < > 90.7 91.0  PLT 223  --  209  < > 196 190  < > = values in this interval not displayed.  Basic Metabolic Panel:   Recent Labs Lab 06/06/15 0324 06/07/15 0628  NA 140 142  K 3.4* 4.0  CL 103 103  CO2 26 29  GLUCOSE 142* 156*  BUN 31* 22*  CREATININE 1.55* 1.48*  CALCIUM 9.5 9.7  MG  --  1.6*   Lipid Panel:     Component Value Date/Time   CHOL 198 06/03/2015 0808   TRIG 373* 06/03/2015 0808   HDL 42 06/03/2015 0808   CHOLHDL 4.7 06/03/2015 0808   VLDL 75* 06/03/2015 0808   LDLCALC 81 06/03/2015 0808   HgbA1c:  Lab Results  Component Value Date   HGBA1C 7.7* 06/03/2015   Urine Drug Screen: No results found for: LABOPIA, COCAINSCRNUR, LABBENZ, AMPHETMU, THCU, LABBARB    IMAGING I have personally reviewed the radiological images below and agree with the radiology interpretations.  Dg Chest 2 View 06/02/2015   No active cardiopulmonary disease.   Ct Head Wo Contrast 06/02/2015   1. Acute nonhemorrhagic stroke involving the right occipital lobe.  2. Remote stroke involving the subcortical white matter of the right posterior parietal lobe as noted on the MRI in October, 2012.   Mri & Mra Head and Mra Neck Wo Contrast 06/02/2015   1. Acute nonhemorrhagic infarct within  the medial right occipital lobe measures 2.5 cm maximally.  2. Remote lacunar infarcts of the basal ganglia and cerebellum bilaterally.  3. Mild age advanced atrophy and white matter disease.  4. Intracranial atherosclerotic changes with mild distal branch vessel irregularity. There is no significant proximal stenosis, aneurysm, or branch vessel occlusion.  5. Mild atherosclerotic changes at the carotid bifurcations bilaterally without a significant stenosis.  6. Flow is antegrade in the vertebral arteries.   Carotid Doppler   There is 1-39% bilateral ICA stenosis. Vertebral artery flow is antegrade.    LE venous doppler - negative for DVT  2D echo  - Left ventricle: The cavity size was normal. There was moderate concentric hypertrophy. Systolic function was vigorous. The estimated ejection fraction was in the range of 65% to 70%. Wall motion was normal; there were no regional wall motion abnormalities. There was an increased relative contribution of atrial contraction to ventricular filling. Doppler parameters are consistent with abnormal left ventricular relaxation (grade 1 diastolic dysfunction). Doppler parameters are consistent with high ventricular filling pressure. - Aortic valve: Moderate diffuse thickening and calcification, consistent with sclerosis. - Mitral valve: Calcified annulus. Mild  diffuse thickening and calcification of the anterior leaflet and posterior leaflet.  TEE - Left Ventrical: Normal LV   Mitral Valve: trace - mild MR, Calcified. No significant stenosis   Aortic Valve: calcified. No AS or AI  Tricuspid Valve: mild TR   Pulmonic Valve: no PI  Left Atrium/ Left atrial appendage: no thrombi  Atrial septum: aneurismal, + PFO, Significant right to left flow with deep breath   Aorta: mild - moderate plaque   PHYSICAL EXAM Exam: Gen: NAD Eyes: anicteric sclerae, moist conjunctivae                    CV: no MRG, no carotid bruits, no peripheral  edema Mental Status: Alert, follows commands, good historian, oriented to self, date, month, year, situation. Neuro:   Detailed Neurologic Exam Speech:    No aphasia, no dysarthria Cranial Nerves:    The pupils are equal, round, and reactive to light.. Attempted, Fundi not visualized.  EOMI. conjugate midline gaze. She has left homonymous hemianopia, lower quadrant more severe than upper quandrant.  Face symmetric, Tongue midline. Hearing intact to voice. Shoulder shrug intact Motor Observation:    no involuntary movements noted. Tone appears normal.  Strength:    5/5 Sensation:    Intact to LT symmetrically Plantars equivocal.    ASSESSMENT/PLAN Ms. THI BLEE is a 82 y.o. female with history of hypertension, hyperlipidemia, chronic kidney disease, diabetes mellitus, and previous TIAs presenting with visual changes.  She did not receive IV t-PA due to late presentation.  Stroke:  Right PCA infarcts, possible embolic from an unknown source.  Resultant  left homonymous visual deficits, lower quadrant more than upper quadrant  MRI  Acute infarct at the medial right occipital lobe.   MRA  no significant stenosis or occlusion.  Carotid Doppler No significant stenosis   2D Echo EF 65-70%. No cardiac source of emboli identified.  LE venous doppler - no DVT  TEE showed PFO and Loop placed.  LDL - 81  HgbA1c 7.7  VTE prophylaxis - Lovenox Diet - low sodium heart healthy  aspirin 81 mg daily prior to admission, now on clopidogrel 75 mg daily.   Patient counseled to be compliant with her antithrombotic medications  Ongoing aggressive stroke risk factor management  Therapy recommendations:  Outpatient therapy for visual changes - PT vs OT , OT recommends OT  Patient advised no driving at discharge  Disposition: return home  Hypertension  Blood pressure stable  Hyperlipidemia  Home meds:  Lipitor 20 mg daily  LDL 81, goal < 70  Lipitor increased to 40 mg  daily  Continue statin at discharge  Diabetes  HgbA1c 7.7, goal < 7.0  Not in good controlled  On liraglutide and actos   Follow up with PCP closely  Other Stroke Risk Factors  Advanced age  Obesity, Body mass index is 36.5 kg/(m^2).   Hx stroke/TIA  Other Active Problems  Mild leukocytosis  Chronic kidney disease - Cre improving   Overall, patient remained stable. Awaiting testing scheduled for Wednesday.  Hospital day # 5  Neurology will sign off. Please call with questions. Pt will follow up with Dr. Erlinda Hong at [facility] in about 2 months. Thanks for the consult.  Rosalin Hawking, MD PhD Stroke Neurology 06/07/2015 10:57 PM     To contact Stroke Continuity provider, please refer to [email or web address] After hours, contact General Neurology

## 2015-06-07 NOTE — Discharge Summary (Signed)
Physician Discharge Summary  WAYNE STEFANICK [id number] DOB: 1931-03-25 DOA: 06/02/2015  PCP: Asencion Noble, MD  Admit date: 06/02/2015 Discharge date: 06/07/2015  Recommendations for Outpatient Follow-up:  1. Pt will need to follow up with PCP in 1-2 weeks post discharge 2. Please obtain BMP to evaluate electrolytes and kidney function 3. Please also check CBC to evaluate Hg and Hct levels 4. Please note that loop recorder placed by cardiology team and pt to continue for 30 days post discharge with cardiology follow up  5. Pt discharged on Mg oral supplementation, please also check mg level   Discharge Diagnoses:  Principal Problem:   Acute CVA (cerebrovascular accident) ([place]) Active Problems:   Hypertension   Hyperlipidemia   CKD (chronic kidney disease) stage 4, GFR 15-29 ml/min (HCC)   DM type 2 (diabetes mellitus, type 2) (HCC)   Leukocytosis   Cerebral infarction due to unspecified mechanism   ARF (acute renal failure) ([place])    Discharge Condition: Stable  Diet recommendation: Heart healthy diet discussed in details   History of present illness:  82 year old female with a past medical history of hypertension, diabetes, TIA, presented with vision changes for the past several days. She was found to have right occipital acute stroke. She was hospitalized for further management. Neurology has been following.  Hospital Course:  Right PCA infarcts, possible embolic from an unknown source. - Resultant left homonymous visual deficits, lower quadrant more than upper quadrant - MRI Acute infarct at the medial right occipital lobe.  - MRA no significant stenosis or occlusion. - Carotid Doppler No significant stenosis  - 2D Echo EF 65-70%. No cardiac source of emboli identified. - LE venous doppler - no DVT - TEE showed PFO and Loop placed per cardiology  - continue aspirin and clopidogrel 75 mg daily.   Hypertension, essential  - continue Losartan per home medical  regimen   Hyperlipidemia - continue Lipitor upon discharge   Diabetes type II with complications of CKD stage III - IV - HgbA1c 7.7, goal < 7.0 - continue home medical regimen   Hypokalemia and hypomagnesemia - supplemented prior to discharge - also needs Mg PO to continue taking upon discharge   Acute on chronic kidney disease, stage III - IV - Cr improving while inpatient   Morbid obesity due to excess calories  - pt meets criteria for morbid obesity with BMI > 35 with underlying risks of HTN, DM, HL - Body mass index is 36.5 kg/(m^2).   Procedures/Studies: Dg Chest 2 View  06/02/2015  CLINICAL DATA:  Recent stroke EXAM: CHEST  2 VIEW COMPARISON:  02/28/2005 FINDINGS: Cardiac shadow is within normal limits. The lungs are well aerated bilaterally. No acute bony abnormality is seen degenerative changes about the left shoulder joint are noted. IMPRESSION: No active cardiopulmonary disease. Electronically Signed   By: Inez Catalina M.D.   On: 06/02/2015 21:25   Ct Head Wo Contrast  06/02/2015  CLINICAL DATA:  82 year old presenting with blurred vision and vertigo which began last night. Current history of hypertension and diabetes. Prior stroke. EXAM: CT HEAD WITHOUT CONTRAST TECHNIQUE: Contiguous axial images were obtained from the base of the skull through the vertex without intravenous contrast. COMPARISON:  CT head 08/15/2012, 12/12/2010 and MRI brain 12/12/2010, 10/18/2009. FINDINGS: Geographic low attenuation involving the cortex and white matter of the right occipital lobe. Remote prior stroke in the subcortical white matter of the right posterior parietal lobe. Moderate changes of small vessel disease of the white matter diffusely. Ventricular  system normal in size and appearance for age. No mass lesion. No midline shift. No acute hemorrhage or hematoma. No extra-axial fluid collections. Mild changes of hyperostosis frontalis interna. No focal osseous abnormality involving the skull.  Tiny mucous retention cyst or polyp in the left maxillary sinus. Remaining visualized paranasal sinuses, bilateral mastoid air cells and bilateral middle ear cavities well-aerated. Extensive bilateral carotid siphon and vertebral artery atherosclerosis. IMPRESSION: 1. Acute nonhemorrhagic stroke involving the right occipital lobe. 2. Remote stroke involving the subcortical white matter of the right posterior parietal lobe as noted on the MRI in October, 2012. I telephoned these results at the time of interpretation on 06/02/2015 at 4:34 pm to [name], a nurse in the emergency department triage, who verbally acknowledged these results. Electronically Signed   By: Evangeline Dakin M.D.   On: 06/02/2015 16:36   Mr [name] Head Wo Contrast  06/02/2015  CLINICAL DATA:  Right-sided headache beginning yesterday. Sent by a eye doctor. No weakness or facial droop. EXAM: MRI HEAD WITHOUT CONTRAST MRA HEAD WITHOUT CONTRAST MRA NECK WITHOUT CONTRAST TECHNIQUE: Multiplanar, multiecho pulse sequences of the brain and surrounding structures were obtained without intravenous contrast. Angiographic images of the Circle of Willis were obtained using MRA technique without intravenous contrast. Angiographic images of the neck were obtained using MRA technique without intravenous contrast. Carotid stenosis measurements (when applicable) are obtained utilizing NASCET criteria, using the distal internal carotid diameter as the denominator. COMPARISON:  CT head without contrast 06/02/2015. MRI brain 12/12/2010. FINDINGS: MRI HEAD FINDINGS Acute nonhemorrhagic infarct is present in the medial right occipital lobe measured 2.1 x 2.2 x 2.5 cm. No other focal areas of restricted diffusion are evident. T2 changes are associated with the area of acute infarction. Periventricular and subcortical T2 changes are otherwise stable. Remote lacunar infarcts are evident in the cerebellum bilaterally. There are remote lacunar infarcts in the basal ganglia  bilaterally as well. MRA HEAD FINDINGS The internal carotid arteries are within normal limits from the high cervical segments through the ICA termini bilaterally. The A1 and M1 segments are normal. The anterior communicating artery is patent. There is moderate attenuation of MCA and ACA branch vessels bilaterally. The left vertebral artery is slightly dominant to the right. The PICA origins are visualized and normal. The vertebrobasilar junction is normal. The basilar artery is within normal limits. Both posterior cerebral arteries originate from the basilar tip. There is some attenuation of distal PCA branch vessels. MRA NECK FINDINGS Time-of-flight images demonstrate no significant flow disturbance at either carotid bifurcation. Flow is antegrade in the vertebral arteries bilaterally. IMPRESSION: 1. Acute nonhemorrhagic infarct within the medial right occipital lobe measures 2.5 cm maximally. 2. Remote lacunar infarcts of the basal ganglia and cerebellum bilaterally. 3. Mild age advanced atrophy and white matter disease. 4. Intracranial atherosclerotic changes with mild distal branch vessel irregularity. There is no significant proximal stenosis, aneurysm, or branch vessel occlusion. 5. Mild atherosclerotic changes at the carotid bifurcations bilaterally without a significant stenosis. 6. Flow is antegrade in the vertebral arteries. Electronically Signed   By: San Morelle M.D.   On: 06/02/2015 20:13   Mr Angiogram Neck Wo Contrast  06/02/2015  CLINICAL DATA:  Right-sided headache beginning yesterday. Sent by a eye doctor. No weakness or facial droop. EXAM: MRI HEAD WITHOUT CONTRAST MRA HEAD WITHOUT CONTRAST MRA NECK WITHOUT CONTRAST TECHNIQUE: Multiplanar, multiecho pulse sequences of the brain and surrounding structures were obtained without intravenous contrast. Angiographic images of the Circle of Willis were obtained using MRA  technique without intravenous contrast. Angiographic images of the neck  were obtained using MRA technique without intravenous contrast. Carotid stenosis measurements (when applicable) are obtained utilizing NASCET criteria, using the distal internal carotid diameter as the denominator. COMPARISON:  CT head without contrast 06/02/2015. MRI brain 12/12/2010. FINDINGS: MRI HEAD FINDINGS Acute nonhemorrhagic infarct is present in the medial right occipital lobe measured 2.1 x 2.2 x 2.5 cm. No other focal areas of restricted diffusion are evident. T2 changes are associated with the area of acute infarction. Periventricular and subcortical T2 changes are otherwise stable. Remote lacunar infarcts are evident in the cerebellum bilaterally. There are remote lacunar infarcts in the basal ganglia bilaterally as well. MRA HEAD FINDINGS The internal carotid arteries are within normal limits from the high cervical segments through the ICA termini bilaterally. The A1 and M1 segments are normal. The anterior communicating artery is patent. There is moderate attenuation of MCA and ACA branch vessels bilaterally. The left vertebral artery is slightly dominant to the right. The PICA origins are visualized and normal. The vertebrobasilar junction is normal. The basilar artery is within normal limits. Both posterior cerebral arteries originate from the basilar tip. There is some attenuation of distal PCA branch vessels. MRA NECK FINDINGS Time-of-flight images demonstrate no significant flow disturbance at either carotid bifurcation. Flow is antegrade in the vertebral arteries bilaterally. IMPRESSION: 1. Acute nonhemorrhagic infarct within the medial right occipital lobe measures 2.5 cm maximally. 2. Remote lacunar infarcts of the basal ganglia and cerebellum bilaterally. 3. Mild age advanced atrophy and white matter disease. 4. Intracranial atherosclerotic changes with mild distal branch vessel irregularity. There is no significant proximal stenosis, aneurysm, or branch vessel occlusion. 5. Mild  atherosclerotic changes at the carotid bifurcations bilaterally without a significant stenosis. 6. Flow is antegrade in the vertebral arteries. Electronically Signed   By: San Morelle M.D.   On: 06/02/2015 20:13   Mr Brain Wo Contrast  06/02/2015  CLINICAL DATA:  Right-sided headache beginning yesterday. Sent by a eye doctor. No weakness or facial droop. EXAM: MRI HEAD WITHOUT CONTRAST MRA HEAD WITHOUT CONTRAST MRA NECK WITHOUT CONTRAST TECHNIQUE: Multiplanar, multiecho pulse sequences of the brain and surrounding structures were obtained without intravenous contrast. Angiographic images of the Circle of Willis were obtained using MRA technique without intravenous contrast. Angiographic images of the neck were obtained using MRA technique without intravenous contrast. Carotid stenosis measurements (when applicable) are obtained utilizing NASCET criteria, using the distal internal carotid diameter as the denominator. COMPARISON:  CT head without contrast 06/02/2015. MRI brain 12/12/2010. FINDINGS: MRI HEAD FINDINGS Acute nonhemorrhagic infarct is present in the medial right occipital lobe measured 2.1 x 2.2 x 2.5 cm. No other focal areas of restricted diffusion are evident. T2 changes are associated with the area of acute infarction. Periventricular and subcortical T2 changes are otherwise stable. Remote lacunar infarcts are evident in the cerebellum bilaterally. There are remote lacunar infarcts in the basal ganglia bilaterally as well. MRA HEAD FINDINGS The internal carotid arteries are within normal limits from the high cervical segments through the ICA termini bilaterally. The A1 and M1 segments are normal. The anterior communicating artery is patent. There is moderate attenuation of MCA and ACA branch vessels bilaterally. The left vertebral artery is slightly dominant to the right. The PICA origins are visualized and normal. The vertebrobasilar junction is normal. The basilar artery is within normal  limits. Both posterior cerebral arteries originate from the basilar tip. There is some attenuation of distal PCA branch vessels. MRA NECK  FINDINGS Time-of-flight images demonstrate no significant flow disturbance at either carotid bifurcation. Flow is antegrade in the vertebral arteries bilaterally. IMPRESSION: 1. Acute nonhemorrhagic infarct within the medial right occipital lobe measures 2.5 cm maximally. 2. Remote lacunar infarcts of the basal ganglia and cerebellum bilaterally. 3. Mild age advanced atrophy and white matter disease. 4. Intracranial atherosclerotic changes with mild distal branch vessel irregularity. There is no significant proximal stenosis, aneurysm, or branch vessel occlusion. 5. Mild atherosclerotic changes at the carotid bifurcations bilaterally without a significant stenosis. 6. Flow is antegrade in the vertebral arteries. Electronically Signed   By: San Morelle M.D.   On: 06/02/2015 20:13   US Breast Ltd Uni Left Inc Axilla  05/24/2015  CLINICAL DATA:  Patient presents for a diagnostic left breast examination to followup probable hematoma/fat necrosis over the upper outer left breast. EXAM: 2D DIGITAL DIAGNOSTIC left MAMMOGRAM WITH CAD AND ADJUNCT TOMO ULTRASOUND left BREAST COMPARISON:  Previous exam(s). ACR Breast Density Category c: The breast tissue is heterogeneously dense, which may obscure small masses. FINDINGS: Examination demonstrates resolution of the previously seen mass over the upper outer left periareolar region. Remainder of the exam is unchanged. Mammographic images were processed with CAD. Targeted ultrasound is performed, showing resolution of the previously seen fat necrosis/hematoma over the 1 o'clock position of the left breast 2 cm from the nipple. IMPRESSION: Resolution of the previously seen hematoma/fat necrosis over the 1 o'clock position of the left breast 2 cm from the nipple. RECOMMENDATION: Recommend continued annual bilateral screening  mammographic followup as patient's next annual bilateral mammogram is due in June 2017. I have discussed the findings and recommendations with the patient. Results were also provided in writing at the conclusion of the visit. If applicable, a reminder letter will be sent to the patient regarding the next appointment. BI-RADS CATEGORY  1: Negative. Electronically Signed   By: Marin Olp M.D.   On: 05/24/2015 14:34   Mm Diag Breast Tomo Uni Left  05/24/2015  CLINICAL DATA:  Patient presents for a diagnostic left breast examination to followup probable hematoma/fat necrosis over the upper outer left breast. EXAM: 2D DIGITAL DIAGNOSTIC left MAMMOGRAM WITH CAD AND ADJUNCT TOMO ULTRASOUND left BREAST COMPARISON:  Previous exam(s). ACR Breast Density Category c: The breast tissue is heterogeneously dense, which may obscure small masses. FINDINGS: Examination demonstrates resolution of the previously seen mass over the upper outer left periareolar region. Remainder of the exam is unchanged. Mammographic images were processed with CAD. Targeted ultrasound is performed, showing resolution of the previously seen fat necrosis/hematoma over the 1 o'clock position of the left breast 2 cm from the nipple. IMPRESSION: Resolution of the previously seen hematoma/fat necrosis over the 1 o'clock position of the left breast 2 cm from the nipple. RECOMMENDATION: Recommend continued annual bilateral screening mammographic followup as patient's next annual bilateral mammogram is due in June 2017. I have discussed the findings and recommendations with the patient. Results were also provided in writing at the conclusion of the visit. If applicable, a reminder letter will be sent to the patient regarding the next appointment. BI-RADS CATEGORY  1: Negative. Electronically Signed   By: Marin Olp M.D.   On: 05/24/2015 14:34   Dg Hip Unilat With Pelvis 2-3 Views Right  05/16/2015  CLINICAL DATA:  Fall several months ago with  persistent pain, initial encounter EXAM: DG HIP (WITH OR WITHOUT PELVIS) 2-3V RIGHT COMPARISON:  None. FINDINGS: There is no evidence of hip fracture or dislocation. There is no  evidence of arthropathy or other focal bone abnormality. IMPRESSION: No acute abnormality noted. Electronically Signed   By: Inez Catalina M.D.   On: 05/16/2015 16:51   Discharge Exam: Filed Vitals:   06/07/15 1115 06/07/15 1513  BP: 136/74 164/74  Pulse: 94 104  Temp: 98 F (36.7 C) 98.1 F (36.7 C)  Resp: 16 18   Filed Vitals:   06/07/15 1045 06/07/15 1055 06/07/15 1115 06/07/15 1513  BP: 165/87 174/87 136/74 164/74  Pulse: 92 94 94 104  Temp:   98 F (36.7 C) 98.1 F (36.7 C)  TempSrc:   Oral Oral  Resp: 12 13 16 18   Height:      Weight:      SpO2: 97% 94% 97% 98%    General: Pt is alert, follows commands appropriately, not in acute distress Cardiovascular: Regular rate and rhythm, S1/S2 +, no murmurs, no rubs, no gallops Respiratory: Clear to auscultation bilaterally, no wheezing, no crackles, no rhonchi Abdominal: Soft, non tender, non distended, bowel sounds +, no guarding Extremities: no edema, no cyanosis, pulses palpable bilaterally DP and PT Neuro: Grossly nonfocal  Discharge Instructions  Discharge Instructions    Diet - low sodium heart healthy    Complete by:  As directed      Increase activity slowly    Complete by:  As directed             Medication List    TAKE these medications        acetaminophen 500 MG tablet  Commonly known as:  TYLENOL  Take 1,000 mg by mouth at bedtime.     aspirin EC 81 MG tablet  Take 81 mg by mouth at bedtime. As blood thinner/ for heart     atorvastatin 20 MG tablet  Commonly known as:  LIPITOR  Take 20 mg by mouth daily.     chlorthalidone 25 MG tablet  Commonly known as:  HYGROTON  Take 25 mg by mouth every morning. For high blood pressure/fluid     clopidogrel 75 MG tablet  Commonly known as:  PLAVIX  Take 1 tablet (75 mg total) by  mouth once.     glimepiride 2 MG tablet  Commonly known as:  AMARYL  Take 2 mg by mouth daily with breakfast. For Diabetes     losartan 100 MG tablet  Commonly known as:  COZAAR  Take 100 mg by mouth daily. For high blood pressure     magnesium oxide 400 (241.3 Mg) MG tablet  Commonly known as:  MAG-OX  Take 1 tablet (400 mg total) by mouth 2 (two) times daily.     multivitamin with minerals Tabs tablet  Take 1 tablet by mouth at bedtime. For supplement     NON FORMULARY  Take 1 tablet by mouth at bedtime. "leg cramp relief med"     pantoprazole 40 MG tablet  Commonly known as:  PROTONIX  Take 40 mg by mouth daily. For acid reflux/gerd     pioglitazone 15 MG tablet  Commonly known as:  ACTOS  Take 15 mg by mouth daily.     predniSONE 10 MG tablet  Commonly known as:  DELTASONE  Take 10-50 mg by mouth daily. Reported on 06/02/2015     traZODone 100 MG tablet  Commonly known as:  DESYREL  Take 100 mg by mouth at bedtime.     venlafaxine XR 75 MG 24 hr capsule  Commonly known as:  EFFEXOR-XR  Take 75 mg by  mouth daily with breakfast.     VICTOZA 18 MG/3ML Sopn  Generic drug:  Liraglutide  Inject 1.8 mg into the skin at bedtime.             Follow-up Information    Follow up with [facility] On 06/19/2015.   Specialty:  Cardiology   Why:  10:00AM, wound check   Contact information:   [address] [place] [phone]      Follow up with Asencion Noble, MD.   Specialty:  Internal Medicine   Contact information:   [address] [place] [address] [phone]       Call Faye Ramsay, MD.   Specialty:  Internal Medicine   Why:  As needed   Contact information:   [address] [place] [address] [phone]        The results of significant diagnostics from this hospitalization (including imaging, microbiology, ancillary and laboratory) are listed  below for reference.     Microbiology: No results found for this or any previous visit (from the past 240 hour(s)).   Labs: Basic Metabolic Panel:  Recent Labs Lab 06/03/15 0808 06/04/15 0511 06/05/15 0252 06/06/15 0324 06/07/15 0628  NA 139 141 141 140 142  K 3.2* 3.8 3.4* 3.4* 4.0  CL 99* 101 102 103 103  CO2 29 29 28 26 29   GLUCOSE 176* 110* 125* 142* 156*  BUN 33* 45* 35* 31* 22*  CREATININE 1.86* 2.16* 1.73* 1.55* 1.48*  CALCIUM 9.6 9.4 9.4 9.5 9.7  MG  --   --   --   --  1.6*   Liver Function Tests:  Recent Labs Lab 06/02/15 1510 06/03/15 0808  AST 24 18  ALT 20 17  ALKPHOS 80 69  BILITOT 0.5 0.5  PROT 6.9 5.7*  ALBUMIN 3.7 3.0*   CBC:  Recent Labs Lab 06/02/15 1510 06/02/15 1522 06/03/15 0808 06/04/15 0511 06/05/15 0252 06/06/15 0324  WBC 13.9*  --  9.4 9.7 9.2 8.6  NEUTROABS 9.6*  --  5.9  --   --   --   HGB 13.7 16.0* 12.2 12.2 11.5* 11.0*  HCT 43.2 47.0* 38.9 39.8 37.0 36.4  MCV 90.6  --  90.0 90.7 90.7 91.0  PLT 223  --  209 188 196 190   CBG:  Recent Labs Lab 06/03/15 0605 06/06/15 1328 06/06/15 1622 06/07/15 0655 06/07/15 1156  GLUCAP 142* 159* 79 141* 185*   SIGNED: Time coordinating discharge: 30 minutes  MAGICK-Noma Quijas, MD  [facility] 06/07/2015, 3:15 PM Pager [phone]  If 7PM-7AM, please contact night-coverage [email or web address] Password [email or web address]

## 2015-06-07 NOTE — Care Management Note (Signed)
Case Management Note  Patient Details  Name: Amy Wiggins MRN: [id number] Date of Birth: 12/09/1931  Subjective/Objective:                    Action/Plan: Patient discharging home with [facility] services. CM met with the patient and her son and provided them a list of [place] agencies in the [place] area. She selected [facility]. Drew with [facility] notified and accepted the referral. Bedside RN updated.   Expected Discharge Date:                  Expected Discharge Plan:  [place]  In-House Referral:     Discharge planning Services  CM Consult  Post Acute Care Choice:  Home Health Choice offered to:  Patient  DME Arranged:    DME Agency:     HH Arranged:  RN, PT, Nurse's Aide [place] Agency:     Status of Service:  Completed, signed off  Medicare Important Message Given:  Yes Date Medicare IM Given:    Medicare IM give by:    Date Additional Medicare IM Given:    Additional Medicare Important Message give by:     If discussed at [facility] of Stay Meetings, dates discussed:    Additional Comments:  Pollie Friar, RN 06/07/2015, 3:33 PM

## 2015-06-07 NOTE — H&P (View-Only) (Signed)
STROKE TEAM PROGRESS NOTE   SUBJECTIVE (INTERVAL HISTORY)  Patient sitting up in the chair. No complaints. Awaiting test for tomorrow. Patient continues to share she has history of palpitations.   OBJECTIVE Temp:  [97.9 F (36.6 C)-98.5 F (36.9 C)] 98.3 F (36.8 C) (04/11 1008) Pulse Rate:  [95-100] 100 (04/11 1008) Cardiac Rhythm:  [-] Heart block (04/11 0700) Resp:  [20] 20 (04/11 1008) BP: (122-154)/(51-73) 122/51 mmHg (04/11 1008) SpO2:  [91 %-98 %] 98 % (04/11 1008)  CBC:  Recent Labs Lab 06/02/15 1510  06/03/15 0808  06/05/15 0252 06/06/15 0324  WBC 13.9*  --  9.4  < > 9.2 8.6  NEUTROABS 9.6*  --  5.9  --   --   --   HGB 13.7  < > 12.2  < > 11.5* 11.0*  HCT 43.2  < > 38.9  < > 37.0 36.4  MCV 90.6  --  90.0  < > 90.7 91.0  PLT 223  --  209  < > 196 190  < > = values in this interval not displayed.  Basic Metabolic Panel:   Recent Labs Lab 06/05/15 0252 06/06/15 0324  NA 141 140  K 3.4* 3.4*  CL 102 103  CO2 28 26  GLUCOSE 125* 142*  BUN 35* 31*  CREATININE 1.73* 1.55*  CALCIUM 9.4 9.5   Lipid Panel:     Component Value Date/Time   CHOL 198 06/03/2015 0808   TRIG 373* 06/03/2015 0808   HDL 42 06/03/2015 0808   CHOLHDL 4.7 06/03/2015 0808   VLDL 75* 06/03/2015 0808   LDLCALC 81 06/03/2015 0808   HgbA1c:  Lab Results  Component Value Date   HGBA1C 7.7* 06/03/2015   Urine Drug Screen: No results found for: LABOPIA, COCAINSCRNUR, LABBENZ, AMPHETMU, THCU, LABBARB    IMAGING I have personally reviewed the radiological images below and agree with the radiology interpretations.  Dg Chest 2 View 06/02/2015   No active cardiopulmonary disease.   Ct Head Wo Contrast 06/02/2015   1. Acute nonhemorrhagic stroke involving the right occipital lobe.  2. Remote stroke involving the subcortical white matter of the right posterior parietal lobe as noted on the MRI in October, 2012.   Mri & Mra Head and Mra Neck Wo Contrast 06/02/2015   1. Acute  nonhemorrhagic infarct within the medial right occipital lobe measures 2.5 cm maximally.  2. Remote lacunar infarcts of the basal ganglia and cerebellum bilaterally.  3. Mild age advanced atrophy and white matter disease.  4. Intracranial atherosclerotic changes with mild distal branch vessel irregularity. There is no significant proximal stenosis, aneurysm, or branch vessel occlusion.  5. Mild atherosclerotic changes at the carotid bifurcations bilaterally without a significant stenosis.  6. Flow is antegrade in the vertebral arteries.   Carotid Doppler   There is 1-39% bilateral ICA stenosis. Vertebral artery flow is antegrade.    LE venous doppler - negative for DVT  2D echo  - Left ventricle: The cavity size was normal. There was moderate concentric hypertrophy. Systolic function was vigorous. The estimated ejection fraction was in the range of 65% to 70%. Wall motion was normal; there were no regional wall motion abnormalities. There was an increased relative contribution of atrial contraction to ventricular filling. Doppler parameters are consistent with abnormal left ventricular relaxation (grade 1 diastolic dysfunction). Doppler parameters are consistent with high ventricular filling pressure. - Aortic valve: Moderate diffuse thickening and calcification, consistent with sclerosis. - Mitral valve: Calcified annulus. Mild diffuse thickening  and calcification of the anterior leaflet and posterior leaflet.   PHYSICAL EXAM Exam: Gen: NAD Eyes: anicteric sclerae, moist conjunctivae                    CV: no MRG, no carotid bruits, no peripheral edema Mental Status: Alert, follows commands, good historian, oriented to self, date, month, year, situation. Neuro:   Detailed Neurologic Exam Speech:    No aphasia, no dysarthria Cranial Nerves:    The pupils are equal, round, and reactive to light.. Attempted, Fundi not visualized.  EOMI. conjugate midline gaze. She has left homonymous  hemianopia, lower quadrant more severe than upper quandrant.  Face symmetric, Tongue midline. Hearing intact to voice. Shoulder shrug intact Motor Observation:    no involuntary movements noted. Tone appears normal.  Strength:    5/5 Sensation:    Intact to LT symmetrically Plantars equivocal.    ASSESSMENT/PLAN Amy Wiggins is a 82 y.o. female with history of hypertension, hyperlipidemia, chronic kidney disease, diabetes mellitus, and previous TIAs presenting with visual changes.  She did not receive IV t-PA due to late presentation.  Stroke:  Right PCA infarcts, possible embolic from an unknown source.  Resultant  left homonymous visual deficits, lower quadrant more than upper quadrant  MRI  Acute infarct at the medial right occipital lobe.   MRA  no significant stenosis or occlusion.  Carotid Doppler No significant stenosis   2D Echo EF 65-70%. No cardiac source of emboli identified.  LE venous doppler - no DVT  TEE and Loop scheduled for Wed due to scheduling difficulties. Made NPO after midnight Wed night for TEE Wed. Patient reports palpitations 2-3 x/day  LDL - 81  HgbA1c 7.7  VTE prophylaxis - Lovenox Diet Carb Modified Fluid consistency:: Thin; Room service appropriate?: Yes Diet NPO time specified  aspirin 81 mg daily prior to admission, now on clopidogrel 75 mg daily  Patient counseled to be compliant with her antithrombotic medications  Ongoing aggressive stroke risk factor management  Therapy recommendations:  Outpatient therapy for visual changes - PT vs OT , OT recommends OT  Patient advised no driving at discharge  Disposition: return home  Hypertension  Blood stable  Hyperlipidemia  Home meds:  Lipitor 20 mg daily  LDL 81, goal < 70  Lipitor increased to 40 mg daily  Continue statin at discharge  Diabetes  HgbA1c 7.7, goal < 7.0  Not in good controlled  On liraglutide and actos   Follow up with PCP closely  Other  Stroke Risk Factors  Advanced age  Obesity, Body mass index is 36.5 kg/(m^2).   Hx stroke/TIA  Other Active Problems  Mild leukocytosis  Chronic kidney disease - Cre improving 2.16->1.55. On NS IV at 75 cc / hr   Overall, patient remained stable. Awaiting testing scheduled for Wednesday.  Hospital day # [place] for Pager information 06/06/2015 1:12 PM   I, the attending vascular neurologist, have personally obtained a history, examined the patient, evaluated laboratory data, individually viewed imaging studies and agree with radiology interpretations. Together with the NP/PA, we formulated the assessment and plan of care which reflects our mutual decision.  I have made any additions or clarifications directly to the above note and agree with the findings and plan as currently documented.   Pt is waiting for TEE and possible loop tomorrow. No complains except continue visual deficit and heart palpitation hx at home. Continue plavix and  statin.   Rosalin Hawking, MD PhD Stroke Neurology 06/06/2015 5:35 PM       To contact Stroke Continuity provider, please refer to [email or web address] After hours, contact General Neurology

## 2015-06-07 NOTE — Progress Notes (Signed)
Pt discharged home with family, by car, assessment stable, discharge instructions reviewed, prescriptions given, all questions answered. IV removed.b Telemetry removed. Pt has loop recorder box with her. Pharmacy came up and gave pt her victoza pen. Pt taken by wheelchair to exit. Time of discharge: 1610

## 2015-06-07 NOTE — Progress Notes (Signed)
Pt was told by Loop recorder staff that she was allowed to get if once she had a the TEE she had to come back up to her room before loop recorder was placed.

## 2015-06-08 ENCOUNTER — Encounter (HOSPITAL_COMMUNITY): Payer: Self-pay | Admitting: Cardiovascular Disease

## 2015-06-09 DIAGNOSIS — I639 Cerebral infarction, unspecified: Secondary | ICD-10-CM | POA: Diagnosis not present

## 2015-06-09 DIAGNOSIS — H53451 Other localized visual field defect, right eye: Secondary | ICD-10-CM | POA: Diagnosis not present

## 2015-06-09 DIAGNOSIS — E119 Type 2 diabetes mellitus without complications: Secondary | ICD-10-CM | POA: Diagnosis not present

## 2015-06-09 DIAGNOSIS — I129 Hypertensive chronic kidney disease with stage 1 through stage 4 chronic kidney disease, or unspecified chronic kidney disease: Secondary | ICD-10-CM | POA: Diagnosis not present

## 2015-06-09 DIAGNOSIS — I10 Essential (primary) hypertension: Secondary | ICD-10-CM | POA: Diagnosis not present

## 2015-06-09 DIAGNOSIS — I6781 Acute cerebrovascular insufficiency: Secondary | ICD-10-CM | POA: Diagnosis not present

## 2015-06-09 DIAGNOSIS — R278 Other lack of coordination: Secondary | ICD-10-CM | POA: Diagnosis not present

## 2015-06-09 DIAGNOSIS — E1122 Type 2 diabetes mellitus with diabetic chronic kidney disease: Secondary | ICD-10-CM | POA: Diagnosis not present

## 2015-06-09 DIAGNOSIS — I69398 Other sequelae of cerebral infarction: Secondary | ICD-10-CM | POA: Diagnosis not present

## 2015-06-12 ENCOUNTER — Other Ambulatory Visit: Payer: Self-pay

## 2015-06-12 VITALS — Ht 63.0 in | Wt 206.0 lb

## 2015-06-12 DIAGNOSIS — I6300 Cerebral infarction due to thrombosis of unspecified precerebral artery: Secondary | ICD-10-CM

## 2015-06-12 NOTE — Patient Outreach (Signed)
[facility]) Care Management  06/13/2015  Amy Wiggins 30-Sep-1931 [id number]   Referral:  06/12/2015 Source:  [facility] Stroke Program Issue:  Red Alert:   Have a way to get to follow-up appointment? No Problems setting up rehab? Yes  Subjective: Patient states Amy Wiggins services have started and rehab issue resolved at this time.  States appt's have been scheduled. States unable to get her BP cuff to work and unable to self check her own BP.    Providers: Primary MD:  Dr. Asencion Wiggins  Cardiologist:  Dr. Marzetta Wiggins [facility] Office - Wound check  06/19/2015  Internal Medicine:  Dr. Faye Wiggins  - call as needed [phone] HH: [facility], PT, CNA (patient declined CNA after getting home)  [phone] Insurance: AARP Medicare Complete.   Psycho/Social: Patient lives with granddaughter Chiropractor and works)  Daughter deceased May 26, 2012.   Mobility: Ambulates with no assistive devices.  ADL's Able to bath self and declined CNA.  Safety:  Still having blurred vision and states she was advised may have up to 3 months. Patient is not driving.  Falls:  None  Pain: acute/surgical related to placement of pace maker into chest wall.   Depression: yes "a little because I can't drive"  Wants to see her grand daughter get married one day.  Takes Venlafaxine 75 mg (24 hour capsule).  for at least a year or more but "does not do a bit of good".   Transportation:  Sister but limited due to sister works; or son.  Caregiver: Granddaughter, Amy Wiggins Advance Directive: No but interested.  Resources: SSI:  1,000/mo.  DME: cane, walker, BP cuff "is not working right", reading glasses.   Stroke, HTN, DM2, CKD, Hyperlipidemia ER visits: 0 Admissions: 1 Last admission:  06/02/2015 - 06/07/2015 Stroke Weight:  206 and was 210 on last MD visit.  C/O swelling in feet and ankles and reported to MD in 05-27-2022.  States h/o following up wit Eye MD for yearly exam:  Dr. Clent Wiggins on 06/02/15 and Dr. Katy Wiggins referred to ED due to vision changes and concerns for stroke.   Medications:  Patient taking 13-14 medications   Co-pay cost issues: yes  Difficulty affording Victoza while in donut hole.  Med cost $600 / month.  States MD has advised Victoza has helped patient more than any other medication and MD does not want to change.   Flu Vaccine: yes / date unknown Pneumonia Vaccine:  Yes / date unknown tDAP Vaccine 07/16/2012  Objective:   Encounter Medications:  Outpatient Encounter Prescriptions as of 06/12/2015  Medication Sig  . Liraglutide (VICTOZA) 18 MG/3ML SOPN Inject 1.8 mg into the skin at bedtime.   Marland Kitchen venlafaxine XR (EFFEXOR-XR) 75 MG 24 hr capsule Take 75 mg by mouth daily with breakfast.  . acetaminophen (TYLENOL) 500 MG tablet Take 1,000 mg by mouth at bedtime.  Marland Kitchen aspirin EC 81 MG tablet Take 81 mg by mouth at bedtime. As blood thinner/ for heart  . atorvastatin (LIPITOR) 20 MG tablet Take 20 mg by mouth daily.  . chlorthalidone (HYGROTON) 25 MG tablet Take 25 mg by mouth every morning. For high blood pressure/fluid  . clopidogrel (PLAVIX) 75 MG tablet Take 1 tablet (75 mg total) by mouth once.  Marland Kitchen glimepiride (AMARYL) 2 MG tablet Take 2 mg by mouth daily with breakfast. For Diabetes  . losartan (COZAAR) 100 MG tablet Take 100 mg by mouth daily. For high blood pressure  .  magnesium oxide (MAG-OX) 400 (241.3 Mg) MG tablet Take 1 tablet (400 mg total) by mouth 2 (two) times daily.  . Multiple Vitamin (MULTIVITAMIN WITH MINERALS) TABS tablet Take 1 tablet by mouth at bedtime. For supplement  . NON FORMULARY Take 1 tablet by mouth at bedtime. "leg cramp relief med"  . pantoprazole (PROTONIX) 40 MG tablet Take 40 mg by mouth daily. For acid reflux/gerd  . pioglitazone (ACTOS) 15 MG tablet Take 15 mg by mouth daily.  . predniSONE (DELTASONE) 10 MG tablet Take 10-50 mg by mouth daily. Reported on 06/02/2015  . traZODone (DESYREL) 100 MG tablet Take 100 mg by mouth  at bedtime.   No facility-administered encounter medications on file as of 06/12/2015.    Functional Status:  In your present state of health, do you have any difficulty performing the following activities: 06/13/2015  Hearing? N  Vision? Y  Difficulty concentrating or making decisions? N  Walking or climbing stairs? Y  Dressing or bathing? N  Doing errands, shopping? Y  Preparing Food and eating ? N  Using the Toilet? N  Managing your Medications? N  Managing your Finances? N  Housekeeping or managing your Housekeeping? N    Fall/Depression Screening: PHQ 2/9 Scores 06/13/2015  PHQ - 2 Score 2  PHQ- 9 Score 3    Assessment: Depression:  poor clinical response to current dosage of Venlafaxine 75 mg (24 hour capsule).  Patient unable to check BP; issues with cuff.  Medication cost issues.  Transportation barrier:  Sister and son work  Plan:  Database administrator CM Referral  -admission within past 30 days  06/02/2015 - 06/07/2015.  -[facility] Stroke eligible for [facility] services.  -H/o DM2, HTN, CKD, Hyperlipidemia, Stroke  [facility] Social Work Referral -Transportation resources -Regulatory affairs officer.   [place] Referral -Victoza cost -Venlafaxine poor clinical response  RN CM advised patient in next [facility] scheduled contact call within the next 10 business days.  RN CM advised to please notify MD of any changes in condition prior to scheduled appt's.   RN CM provided contact name and # [phone] or main office # [phone] and 24-hour nurse line # [phone].  RN CM confirmed patient is aware of 911 services for urgent emergency needs.  Mariann Laster, RN, BSN, [facility], CCM  [employer] Management Coordinator [phone] Direct [phone] Cell [phone] Office [phone] Fax

## 2015-06-13 ENCOUNTER — Encounter: Payer: Self-pay | Admitting: Internal Medicine

## 2015-06-13 DIAGNOSIS — I10 Essential (primary) hypertension: Secondary | ICD-10-CM | POA: Diagnosis not present

## 2015-06-13 DIAGNOSIS — R278 Other lack of coordination: Secondary | ICD-10-CM | POA: Diagnosis not present

## 2015-06-13 DIAGNOSIS — I639 Cerebral infarction, unspecified: Secondary | ICD-10-CM | POA: Diagnosis not present

## 2015-06-13 DIAGNOSIS — I129 Hypertensive chronic kidney disease with stage 1 through stage 4 chronic kidney disease, or unspecified chronic kidney disease: Secondary | ICD-10-CM | POA: Diagnosis not present

## 2015-06-13 DIAGNOSIS — I6781 Acute cerebrovascular insufficiency: Secondary | ICD-10-CM | POA: Diagnosis not present

## 2015-06-13 DIAGNOSIS — E119 Type 2 diabetes mellitus without complications: Secondary | ICD-10-CM | POA: Diagnosis not present

## 2015-06-14 ENCOUNTER — Other Ambulatory Visit: Payer: Self-pay | Admitting: Licensed Clinical Social Worker

## 2015-06-14 ENCOUNTER — Encounter: Payer: Self-pay | Admitting: Licensed Clinical Social Worker

## 2015-06-14 DIAGNOSIS — B078 Other viral warts: Secondary | ICD-10-CM | POA: Diagnosis not present

## 2015-06-14 DIAGNOSIS — D225 Melanocytic nevi of trunk: Secondary | ICD-10-CM | POA: Diagnosis not present

## 2015-06-14 NOTE — Patient Outreach (Signed)
Assessment:  CSW received referral on SHAWNDREA IRIGOYEN.  CSW completed chart review on client on 06/14/15.  Client sees Dr. Asencion Noble as her primary care doctor. Client resides with her grandaughter who provides some care and assistance support for client.  CSW received referral from Mariann Laster, RN related to client's need for transportation resources and client's need to learn more about completion of advanced directives.  CSW called client on 06/14/15 and spoke via phone with client.  CSW verified client identity.  CSW received verbal permission from client on 06/14/15 for CSW to speak with client about current needs of client.  Client was recently hospitalized and has now returned home. Client lives with her grandaughter, [name].  Client has some support from client's sister and from client's son.  [name], client's grandaughter is a Electronics engineer and also works a part time job.  Client had a recent stroke and has been challenged with blurry vision. She has been wearing reading glasses previously to help with vision issues.  Client has a scheduled appointment with a cardiologist on 06/19/15.  Client has her prescribed medications and is taking medications as prescribed.  Client had an eye appointment/exam with Dr. Clent Jacks on 06/02/15.  Client is currently receiving home health services. Client said she is receiving home health nursing support and is receiving home health physical therapy support. CSW and client updated [facility] assessments as needed. CSW informed client that Freda Jackson would be mailing client [facility] consent form for client to review.  CSW and client spoke of [facility] consent form completion. CSW and client spoke of client care plan goal.  Client agreed that she would try to make transport arrangements for her to travel to and from her upcoming medical appointments in the next 30 days.  CSW informed client about Aging, Disability and Transient Services in [place], [place]. [place] gave client the  agency phone number for Aging,Disability and Transient Services.  CSW encouraged client to call that agency and to add her name to transportation list for Aging, Disability and Transient Services. CSW reviewed with client the customary costs for transport with that agency within [place], [place]. [place] and client also spoke of Advanced Directives information. CSW offered to help client with completion of advanced directives documents as needed. CSW spoke with client about [place] document completion. CSW gave client [facility] CSW phone number of [phone] and invited [place] to call CSW as needed to discuss social work issues of concern to client. CSW thanked [place] for phone conversation with CSW on 06/14/15.  Plan:  Client to make transport arrangements for client to travel to and from upcoming client medical appointments in the next 30 days. CSW to call client in two weeks to assess needs of client at that time.   [name].Alekzander Cardell MSW, LCSW Licensed Clinical Social Worker [facility] Care Management [phone]

## 2015-06-15 DIAGNOSIS — I129 Hypertensive chronic kidney disease with stage 1 through stage 4 chronic kidney disease, or unspecified chronic kidney disease: Secondary | ICD-10-CM | POA: Diagnosis not present

## 2015-06-15 DIAGNOSIS — I10 Essential (primary) hypertension: Secondary | ICD-10-CM | POA: Diagnosis not present

## 2015-06-15 DIAGNOSIS — R278 Other lack of coordination: Secondary | ICD-10-CM | POA: Diagnosis not present

## 2015-06-15 DIAGNOSIS — I639 Cerebral infarction, unspecified: Secondary | ICD-10-CM | POA: Diagnosis not present

## 2015-06-15 DIAGNOSIS — E119 Type 2 diabetes mellitus without complications: Secondary | ICD-10-CM | POA: Diagnosis not present

## 2015-06-15 DIAGNOSIS — I6781 Acute cerebrovascular insufficiency: Secondary | ICD-10-CM | POA: Diagnosis not present

## 2015-06-16 DIAGNOSIS — I639 Cerebral infarction, unspecified: Secondary | ICD-10-CM | POA: Diagnosis not present

## 2015-06-16 DIAGNOSIS — E119 Type 2 diabetes mellitus without complications: Secondary | ICD-10-CM | POA: Diagnosis not present

## 2015-06-16 DIAGNOSIS — I6781 Acute cerebrovascular insufficiency: Secondary | ICD-10-CM | POA: Diagnosis not present

## 2015-06-16 DIAGNOSIS — R278 Other lack of coordination: Secondary | ICD-10-CM | POA: Diagnosis not present

## 2015-06-16 DIAGNOSIS — I10 Essential (primary) hypertension: Secondary | ICD-10-CM | POA: Diagnosis not present

## 2015-06-16 DIAGNOSIS — I129 Hypertensive chronic kidney disease with stage 1 through stage 4 chronic kidney disease, or unspecified chronic kidney disease: Secondary | ICD-10-CM | POA: Diagnosis not present

## 2015-06-19 ENCOUNTER — Ambulatory Visit (INDEPENDENT_AMBULATORY_CARE_PROVIDER_SITE_OTHER): Payer: Medicare Other | Admitting: *Deleted

## 2015-06-19 ENCOUNTER — Encounter: Payer: Self-pay | Admitting: Internal Medicine

## 2015-06-19 DIAGNOSIS — Z4509 Encounter for adjustment and management of other cardiac device: Secondary | ICD-10-CM

## 2015-06-19 LAB — CUP PACEART INCLINIC DEVICE CHECK: MDC IDC SESS DTM: 20170424101045

## 2015-06-19 NOTE — Progress Notes (Signed)
Loop wound check in clinic. Steri-strips removed. Incision edges approximated. No redness, swelling or drainage. Patient educated about wound care and Carelink monitoring. Battery status: good. R-waves 0.51mV. 1 tachy episode- 5 seconds @ 182bpm. Monthly summary reports and ROV with SK in 12 months.

## 2015-06-20 ENCOUNTER — Encounter: Payer: Self-pay | Admitting: *Deleted

## 2015-06-20 ENCOUNTER — Other Ambulatory Visit: Payer: Self-pay | Admitting: *Deleted

## 2015-06-20 DIAGNOSIS — I6781 Acute cerebrovascular insufficiency: Secondary | ICD-10-CM | POA: Diagnosis not present

## 2015-06-20 DIAGNOSIS — I10 Essential (primary) hypertension: Secondary | ICD-10-CM | POA: Diagnosis not present

## 2015-06-20 DIAGNOSIS — R278 Other lack of coordination: Secondary | ICD-10-CM | POA: Diagnosis not present

## 2015-06-20 DIAGNOSIS — I129 Hypertensive chronic kidney disease with stage 1 through stage 4 chronic kidney disease, or unspecified chronic kidney disease: Secondary | ICD-10-CM | POA: Diagnosis not present

## 2015-06-20 DIAGNOSIS — E119 Type 2 diabetes mellitus without complications: Secondary | ICD-10-CM | POA: Diagnosis not present

## 2015-06-20 DIAGNOSIS — I639 Cerebral infarction, unspecified: Secondary | ICD-10-CM | POA: Diagnosis not present

## 2015-06-20 NOTE — Patient Outreach (Signed)
Outreach call to patient in regard to referral from telephonic screening. Patient reports she already has a nurse from Home health and bath aide coming out at this time and does not feel she needs [facility] program services. RNCM attempted to explain difference between [facility] program and Home Health services. Patient did take RNCM contact and agrees to [facility] mailing additional information about our program out to her. Plan to mail letter with brochure. Will send letter to MD regarding discipline closure. LCSW still involved will let him know that RNCM case closed Stanton Kidney E. Laymond Purser, RN, BSN, [place] [phone]

## 2015-06-22 DIAGNOSIS — I129 Hypertensive chronic kidney disease with stage 1 through stage 4 chronic kidney disease, or unspecified chronic kidney disease: Secondary | ICD-10-CM | POA: Diagnosis not present

## 2015-06-22 DIAGNOSIS — I6781 Acute cerebrovascular insufficiency: Secondary | ICD-10-CM | POA: Diagnosis not present

## 2015-06-22 DIAGNOSIS — E119 Type 2 diabetes mellitus without complications: Secondary | ICD-10-CM | POA: Diagnosis not present

## 2015-06-22 DIAGNOSIS — I10 Essential (primary) hypertension: Secondary | ICD-10-CM | POA: Diagnosis not present

## 2015-06-22 DIAGNOSIS — I639 Cerebral infarction, unspecified: Secondary | ICD-10-CM | POA: Diagnosis not present

## 2015-06-22 DIAGNOSIS — R278 Other lack of coordination: Secondary | ICD-10-CM | POA: Diagnosis not present

## 2015-06-23 ENCOUNTER — Other Ambulatory Visit (HOSPITAL_COMMUNITY)
Admission: AD | Admit: 2015-06-23 | Discharge: 2015-06-23 | Disposition: A | Discharge status: Home / Self Care | Payer: Medicare Other | Source: Other Acute Inpatient Hospital | Attending: Internal Medicine | Admitting: Internal Medicine

## 2015-06-23 DIAGNOSIS — I6781 Acute cerebrovascular insufficiency: Secondary | ICD-10-CM | POA: Diagnosis not present

## 2015-06-23 DIAGNOSIS — I639 Cerebral infarction, unspecified: Secondary | ICD-10-CM | POA: Diagnosis not present

## 2015-06-23 DIAGNOSIS — I129 Hypertensive chronic kidney disease with stage 1 through stage 4 chronic kidney disease, or unspecified chronic kidney disease: Secondary | ICD-10-CM | POA: Diagnosis not present

## 2015-06-23 DIAGNOSIS — E119 Type 2 diabetes mellitus without complications: Secondary | ICD-10-CM | POA: Diagnosis not present

## 2015-06-23 DIAGNOSIS — N390 Urinary tract infection, site not specified: Secondary | ICD-10-CM | POA: Diagnosis not present

## 2015-06-23 DIAGNOSIS — I10 Essential (primary) hypertension: Secondary | ICD-10-CM | POA: Diagnosis not present

## 2015-06-23 DIAGNOSIS — R278 Other lack of coordination: Secondary | ICD-10-CM | POA: Diagnosis not present

## 2015-06-23 LAB — URINE MICROSCOPIC-ADD ON

## 2015-06-23 LAB — URINALYSIS, ROUTINE W REFLEX MICROSCOPIC
BILIRUBIN URINE: NEGATIVE
GLUCOSE, UA: NEGATIVE mg/dL
HGB URINE DIPSTICK: NEGATIVE
KETONES UR: NEGATIVE mg/dL
Nitrite: NEGATIVE
PH: 5.5 (ref 5.0–8.0)
Protein, ur: NEGATIVE mg/dL
Specific Gravity, Urine: 1.01 (ref 1.005–1.030)

## 2015-06-26 LAB — URINE CULTURE

## 2015-06-27 DIAGNOSIS — R278 Other lack of coordination: Secondary | ICD-10-CM | POA: Diagnosis not present

## 2015-06-27 DIAGNOSIS — Z7984 Long term (current) use of oral hypoglycemic drugs: Secondary | ICD-10-CM | POA: Diagnosis not present

## 2015-06-27 DIAGNOSIS — H53451 Other localized visual field defect, right eye: Secondary | ICD-10-CM | POA: Diagnosis not present

## 2015-06-27 DIAGNOSIS — N184 Chronic kidney disease, stage 4 (severe): Secondary | ICD-10-CM | POA: Diagnosis not present

## 2015-06-27 DIAGNOSIS — E1122 Type 2 diabetes mellitus with diabetic chronic kidney disease: Secondary | ICD-10-CM | POA: Diagnosis not present

## 2015-06-27 DIAGNOSIS — I129 Hypertensive chronic kidney disease with stage 1 through stage 4 chronic kidney disease, or unspecified chronic kidney disease: Secondary | ICD-10-CM | POA: Diagnosis not present

## 2015-06-27 DIAGNOSIS — Z7901 Long term (current) use of anticoagulants: Secondary | ICD-10-CM | POA: Diagnosis not present

## 2015-06-27 DIAGNOSIS — I69398 Other sequelae of cerebral infarction: Secondary | ICD-10-CM | POA: Diagnosis not present

## 2015-06-28 ENCOUNTER — Other Ambulatory Visit: Payer: Self-pay | Admitting: Pharmacist

## 2015-06-28 DIAGNOSIS — Z7984 Long term (current) use of oral hypoglycemic drugs: Secondary | ICD-10-CM | POA: Diagnosis not present

## 2015-06-28 DIAGNOSIS — Z7901 Long term (current) use of anticoagulants: Secondary | ICD-10-CM | POA: Diagnosis not present

## 2015-06-28 DIAGNOSIS — I69398 Other sequelae of cerebral infarction: Secondary | ICD-10-CM | POA: Diagnosis not present

## 2015-06-28 DIAGNOSIS — E1122 Type 2 diabetes mellitus with diabetic chronic kidney disease: Secondary | ICD-10-CM | POA: Diagnosis not present

## 2015-06-28 DIAGNOSIS — I129 Hypertensive chronic kidney disease with stage 1 through stage 4 chronic kidney disease, or unspecified chronic kidney disease: Secondary | ICD-10-CM | POA: Diagnosis not present

## 2015-06-28 DIAGNOSIS — H53451 Other localized visual field defect, right eye: Secondary | ICD-10-CM | POA: Diagnosis not present

## 2015-06-28 DIAGNOSIS — N184 Chronic kidney disease, stage 4 (severe): Secondary | ICD-10-CM | POA: Diagnosis not present

## 2015-06-28 DIAGNOSIS — R278 Other lack of coordination: Secondary | ICD-10-CM | POA: Diagnosis not present

## 2015-06-29 ENCOUNTER — Other Ambulatory Visit: Payer: Self-pay | Admitting: Licensed Clinical Social Worker

## 2015-06-29 DIAGNOSIS — R278 Other lack of coordination: Secondary | ICD-10-CM | POA: Diagnosis not present

## 2015-06-29 DIAGNOSIS — H53451 Other localized visual field defect, right eye: Secondary | ICD-10-CM | POA: Diagnosis not present

## 2015-06-29 DIAGNOSIS — E1122 Type 2 diabetes mellitus with diabetic chronic kidney disease: Secondary | ICD-10-CM | POA: Diagnosis not present

## 2015-06-29 DIAGNOSIS — I69398 Other sequelae of cerebral infarction: Secondary | ICD-10-CM | POA: Diagnosis not present

## 2015-06-29 DIAGNOSIS — N184 Chronic kidney disease, stage 4 (severe): Secondary | ICD-10-CM | POA: Diagnosis not present

## 2015-06-29 DIAGNOSIS — Z7901 Long term (current) use of anticoagulants: Secondary | ICD-10-CM | POA: Diagnosis not present

## 2015-06-29 DIAGNOSIS — I129 Hypertensive chronic kidney disease with stage 1 through stage 4 chronic kidney disease, or unspecified chronic kidney disease: Secondary | ICD-10-CM | POA: Diagnosis not present

## 2015-06-29 DIAGNOSIS — Z7984 Long term (current) use of oral hypoglycemic drugs: Secondary | ICD-10-CM | POA: Diagnosis not present

## 2015-06-29 NOTE — Patient Outreach (Signed)
[facility]) Care Management  [place]   06/29/2015  ALDORA Wiggins Sep 16, 1931 [id number]  Subjective: Amy Wiggins is a 82yo who was referred to [place] for medication assistance.  Per referral, patient reports difficulty with cost of Victoza.    Completed phone call to patient.  Verified date of birth and address.  Explained purpose of the call and reviewed medications as well as discussed medication assistance with patient.    Patient reports she fills all medications at [facility].  Patient reports she only has difficulty affording medications when she is in the donut hole.  Patient reports she is not in the donut hole right now and has all medications at this time.    Objective:   Encounter Medications: Outpatient Encounter Prescriptions as of 06/28/2015  Medication Sig Note  . acetaminophen (TYLENOL) 500 MG tablet Take 1,000 mg by mouth at bedtime. 06/28/2015: Takes as needed. Reports taking 2 tablets a day maximum.   Marland Kitchen aspirin EC 81 MG tablet Take 81 mg by mouth at bedtime. As blood thinner/ for heart   . atorvastatin (LIPITOR) 20 MG tablet Take 20 mg by mouth daily.   . chlorthalidone (HYGROTON) 25 MG tablet Take 25 mg by mouth every morning. For high blood pressure/fluid   . clopidogrel (PLAVIX) 75 MG tablet Take 1 tablet (75 mg total) by mouth once.   Marland Kitchen glimepiride (AMARYL) 2 MG tablet Take 2 mg by mouth daily with breakfast. For Diabetes   . Liraglutide (VICTOZA) 18 MG/3ML SOPN Inject 1.8 mg into the skin at bedtime.    Marland Kitchen losartan (COZAAR) 100 MG tablet Take 100 mg by mouth daily. For high blood pressure   . Multiple Vitamin (MULTIVITAMIN WITH MINERALS) TABS tablet Take 1 tablet by mouth at bedtime. For supplement   . NON FORMULARY Take 1 tablet by mouth at bedtime. "leg cramp relief med"   . pantoprazole (PROTONIX) 40 MG tablet Take 40 mg by mouth daily. For acid reflux/gerd   . pioglitazone (ACTOS) 15 MG tablet Take 15 mg by  mouth daily.   . traZODone (DESYREL) 100 MG tablet Take 100 mg by mouth at bedtime.   Marland Kitchen venlafaxine XR (EFFEXOR-XR) 75 MG 24 hr capsule Take 75 mg by mouth daily with breakfast.   . [DISCONTINUED] magnesium oxide (MAG-OX) 400 (241.3 Mg) MG tablet Take 1 tablet (400 mg total) by mouth 2 (two) times daily. (Patient not taking: Reported on 06/19/2015)   . [DISCONTINUED] predniSONE (DELTASONE) 10 MG tablet Take 10-50 mg by mouth daily. Reported on 06/28/2015    No facility-administered encounter medications on file as of 06/28/2015.    Functional Status: In your present state of health, do you have any difficulty performing the following activities: 06/14/2015 06/14/2015  Hearing? - N  Vision? (No Data) Y  Difficulty concentrating or making decisions? - N  Walking or climbing stairs? - Y  Dressing or bathing? - N  Doing errands, shopping? - Y  Conservation officer, nature and eating ? - N  Using the Toilet? - N  In the past six months, have you accidently leaked urine? - N  Do you have problems with loss of bowel control? - N  Managing your Medications? - N  Managing your Finances? - N  Housekeeping or managing your Housekeeping? - N    Fall/Depression Screening: PHQ 2/9 Scores 06/29/2015 06/14/2015 06/14/2015 06/13/2015  PHQ - 2 Score 2 2 2 2   PHQ- 9 Score 4 4 4  3  Assessment: 1.  Medication review:   Drugs sorted by system:  Neurologic/Psychologic: trazodone, venlafaxine  Cardiovascular: aspirin, atorvastatin, chlorthalidone, clopidogrel  Gastrointestinal: pantoprazole  Endocrine: glimepiride, liraglutide, pioglitazone  Pain: acetaminophen  Vitamins/Minerals: multivitamin  Miscellaneous: "Leg Cramp Relief Medication"   Duplications in therapy: dual antiplatelet therapy (aspirin and clopidogrel) Gaps in therapy: none noted Medications to avoid in the elderly: pantoprazole (risk of Clostridium difficile infection and bone loss and fractures) Drug interactions: aspirin and clopidogrel - may  increase risk of bleeding  Other issues noted: Patient is currently on venlafaxine 75 mg daily for anxiety.  Patient reports she has not noticed any improvement in anxiety since initiation of venlafaxine over one year ago.  Patient reports she used to be on a "pink pill" that helped anxiety a lot.  Identified that "pink pill" was Xanax.  Counseled patient on risks of Xanax use in elderly including increased risk of cognitive impairment, delirium, falls, fractures, and motor vehicle crashes.  Patient voiced understanding.    2.  Medication assistance:  Patient reports difficulty affording Victoza when she is in the donut hole.  Patient is not in the donut hole at this time and confirms she has all medications.  Discussed options for medication assistance such as Extra Help and Victoza patient assistance program.  To qualify for patient assistance program for Victoza, patient must have applied for and been denied Extra Help and spend $1,000 out of pocket on prescription medications in the current calendar year.  Patient reports she has never applied for Extra Help.  Assisted patient in completing Extra Help application.  Patient should receive letter in the mail in 2-4 weeks regarding outcome of Extra Help application.  Advised patient in the mean time to find out how much she has spent out of pocket on prescription medications in the current calendar year in case she is denied Extra Help.    Plan: 1.  Medication review:  I will send the findings of my medication review to patient's PCP.  Will recommend changing pantoprazole to a H2 blocker such as ranitidine or famotidine adjusted for patient's renal function if clinically appropriate.  Will also recommend discontinuation of aspirin as the combination of aspirin and clopidogrel for secondary stroke prevention is no better than either agent alone and combination use has an increased risk of bleeding. Will also recommend considering an increase in venlafaxine  dose as patient reports no improvement in anxiety since initiation or consider trial of alternative medication such as an SSRI if clinically appropriate.    2.  Medication assistance:  Completed Extra Help application with patient.  Provided patient with my phone number and advised her to call me once she received letter from social security in the mail regarding application.  Will follow up with patient in 4 weeks if I have not heard from her.     Elisabeth Most, Pharm.D. Pharmacy Resident [place] [phone]

## 2015-06-29 NOTE — Patient Outreach (Signed)
Assessment:  CSW spoke via phone with client on 06/29/15.  CSW verified client identity. CSW and client spoke of client needs.  Client said she is still receiving in home physical therapy and nursing support with home health agency.  She said that physical therapy was very helpful to client.  Client said she has her prescribed medications and is taking medications as prescribed.  Client said her son is now helping in transporting her to and from her scheduled medical appointments.  She said she has appointment scheduled with [name] on 07/11/15.  Client sees Dr. Katy Fitch as her eye doctor. She did not mention any pain issues at present.  Client said she is eating well and sleeping well. Client said she likes to go fishing when she is able, as a form of relaxation.  She said she has friends who visit her regularly or call her regularly.   Client uses reading glasses to help with vision.  She said she is scheduled for appointment with eye specialist next Tuesday.  CSW and client spoke of client care plan. CSW encouraged client to make transport arrangements as needed to help client go to and from scheduled client medical appointments. Client said she had not needed to call Aging, Disability and Transient Services recently. She said her son had been helping transport her to her scheduled medical appointments. CSW thanked client for phone call with CSW on 06/29/15. CSW encouraged Nakala to call CSW at [phone] as needed to discuss social work needs of client.   Plan:  Client will make transport arrangements as needed to enable client to attend all scheduled client medical appointments in next 30 days CSW to call client in 4 weeks to assess client needs.   [name].Kanna Dafoe MSW, LCSW Licensed Clinical Social Worker [facility] Care Management [phone]

## 2015-06-30 DIAGNOSIS — H53451 Other localized visual field defect, right eye: Secondary | ICD-10-CM | POA: Diagnosis not present

## 2015-06-30 DIAGNOSIS — N184 Chronic kidney disease, stage 4 (severe): Secondary | ICD-10-CM | POA: Diagnosis not present

## 2015-06-30 DIAGNOSIS — I69398 Other sequelae of cerebral infarction: Secondary | ICD-10-CM | POA: Diagnosis not present

## 2015-06-30 DIAGNOSIS — E1122 Type 2 diabetes mellitus with diabetic chronic kidney disease: Secondary | ICD-10-CM | POA: Diagnosis not present

## 2015-06-30 DIAGNOSIS — Z7984 Long term (current) use of oral hypoglycemic drugs: Secondary | ICD-10-CM | POA: Diagnosis not present

## 2015-06-30 DIAGNOSIS — R278 Other lack of coordination: Secondary | ICD-10-CM | POA: Diagnosis not present

## 2015-06-30 DIAGNOSIS — I129 Hypertensive chronic kidney disease with stage 1 through stage 4 chronic kidney disease, or unspecified chronic kidney disease: Secondary | ICD-10-CM | POA: Diagnosis not present

## 2015-06-30 DIAGNOSIS — Z7901 Long term (current) use of anticoagulants: Secondary | ICD-10-CM | POA: Diagnosis not present

## 2015-07-04 DIAGNOSIS — Z961 Presence of intraocular lens: Secondary | ICD-10-CM | POA: Diagnosis not present

## 2015-07-04 DIAGNOSIS — H26492 Other secondary cataract, left eye: Secondary | ICD-10-CM | POA: Diagnosis not present

## 2015-07-04 DIAGNOSIS — E119 Type 2 diabetes mellitus without complications: Secondary | ICD-10-CM | POA: Diagnosis not present

## 2015-07-04 DIAGNOSIS — H47641 Disorders of visual cortex in (due to) vascular disorders, right side of brain: Secondary | ICD-10-CM | POA: Diagnosis not present

## 2015-07-04 DIAGNOSIS — H353131 Nonexudative age-related macular degeneration, bilateral, early dry stage: Secondary | ICD-10-CM | POA: Diagnosis not present

## 2015-07-05 ENCOUNTER — Other Ambulatory Visit (HOSPITAL_COMMUNITY)
Admission: RE | Admit: 2015-07-05 | Discharge: 2015-07-05 | Disposition: A | Discharge status: Home / Self Care | Payer: Medicare Other | Source: Ambulatory Visit | Attending: Internal Medicine | Admitting: Internal Medicine

## 2015-07-05 DIAGNOSIS — E1122 Type 2 diabetes mellitus with diabetic chronic kidney disease: Secondary | ICD-10-CM | POA: Diagnosis not present

## 2015-07-05 DIAGNOSIS — N390 Urinary tract infection, site not specified: Secondary | ICD-10-CM | POA: Insufficient documentation

## 2015-07-05 DIAGNOSIS — N184 Chronic kidney disease, stage 4 (severe): Secondary | ICD-10-CM | POA: Diagnosis not present

## 2015-07-05 DIAGNOSIS — R278 Other lack of coordination: Secondary | ICD-10-CM | POA: Diagnosis not present

## 2015-07-05 DIAGNOSIS — I69398 Other sequelae of cerebral infarction: Secondary | ICD-10-CM | POA: Diagnosis not present

## 2015-07-05 DIAGNOSIS — I129 Hypertensive chronic kidney disease with stage 1 through stage 4 chronic kidney disease, or unspecified chronic kidney disease: Secondary | ICD-10-CM | POA: Diagnosis not present

## 2015-07-05 DIAGNOSIS — Z7901 Long term (current) use of anticoagulants: Secondary | ICD-10-CM | POA: Diagnosis not present

## 2015-07-05 DIAGNOSIS — Z7984 Long term (current) use of oral hypoglycemic drugs: Secondary | ICD-10-CM | POA: Diagnosis not present

## 2015-07-05 DIAGNOSIS — H53451 Other localized visual field defect, right eye: Secondary | ICD-10-CM | POA: Diagnosis not present

## 2015-07-05 LAB — URINALYSIS, ROUTINE W REFLEX MICROSCOPIC
Bilirubin Urine: NEGATIVE
Glucose, UA: NEGATIVE mg/dL
HGB URINE DIPSTICK: NEGATIVE
Ketones, ur: NEGATIVE mg/dL
Leukocytes, UA: NEGATIVE
Nitrite: NEGATIVE
PH: 5 (ref 5.0–8.0)
Protein, ur: NEGATIVE mg/dL
SPECIFIC GRAVITY, URINE: 1.025 (ref 1.005–1.030)

## 2015-07-06 DIAGNOSIS — R2231 Localized swelling, mass and lump, right upper limb: Secondary | ICD-10-CM | POA: Diagnosis not present

## 2015-07-07 ENCOUNTER — Ambulatory Visit (INDEPENDENT_AMBULATORY_CARE_PROVIDER_SITE_OTHER): Payer: Medicare Other | Admitting: *Deleted

## 2015-07-07 DIAGNOSIS — Z4509 Encounter for adjustment and management of other cardiac device: Secondary | ICD-10-CM | POA: Diagnosis not present

## 2015-07-07 LAB — URINE CULTURE

## 2015-07-10 NOTE — Progress Notes (Signed)
Carelink Summary Report / Loop Recorder 

## 2015-07-11 DIAGNOSIS — I129 Hypertensive chronic kidney disease with stage 1 through stage 4 chronic kidney disease, or unspecified chronic kidney disease: Secondary | ICD-10-CM | POA: Diagnosis not present

## 2015-07-11 DIAGNOSIS — N183 Chronic kidney disease, stage 3 (moderate): Secondary | ICD-10-CM | POA: Diagnosis not present

## 2015-07-11 DIAGNOSIS — Z7901 Long term (current) use of anticoagulants: Secondary | ICD-10-CM | POA: Diagnosis not present

## 2015-07-11 DIAGNOSIS — I69398 Other sequelae of cerebral infarction: Secondary | ICD-10-CM | POA: Diagnosis not present

## 2015-07-11 DIAGNOSIS — H53451 Other localized visual field defect, right eye: Secondary | ICD-10-CM | POA: Diagnosis not present

## 2015-07-11 DIAGNOSIS — Z7984 Long term (current) use of oral hypoglycemic drugs: Secondary | ICD-10-CM | POA: Diagnosis not present

## 2015-07-11 DIAGNOSIS — R278 Other lack of coordination: Secondary | ICD-10-CM | POA: Diagnosis not present

## 2015-07-11 DIAGNOSIS — N184 Chronic kidney disease, stage 4 (severe): Secondary | ICD-10-CM | POA: Diagnosis not present

## 2015-07-11 DIAGNOSIS — E1122 Type 2 diabetes mellitus with diabetic chronic kidney disease: Secondary | ICD-10-CM | POA: Diagnosis not present

## 2015-07-11 DIAGNOSIS — I10 Essential (primary) hypertension: Secondary | ICD-10-CM | POA: Diagnosis not present

## 2015-07-14 ENCOUNTER — Telehealth: Payer: Self-pay | Admitting: *Deleted

## 2015-07-14 ENCOUNTER — Encounter: Payer: Self-pay | Admitting: Internal Medicine

## 2015-07-14 DIAGNOSIS — E1122 Type 2 diabetes mellitus with diabetic chronic kidney disease: Secondary | ICD-10-CM | POA: Diagnosis not present

## 2015-07-14 DIAGNOSIS — H53451 Other localized visual field defect, right eye: Secondary | ICD-10-CM | POA: Diagnosis not present

## 2015-07-14 DIAGNOSIS — Z7901 Long term (current) use of anticoagulants: Secondary | ICD-10-CM | POA: Diagnosis not present

## 2015-07-14 DIAGNOSIS — I69398 Other sequelae of cerebral infarction: Secondary | ICD-10-CM | POA: Diagnosis not present

## 2015-07-14 DIAGNOSIS — Z7984 Long term (current) use of oral hypoglycemic drugs: Secondary | ICD-10-CM | POA: Diagnosis not present

## 2015-07-14 DIAGNOSIS — I129 Hypertensive chronic kidney disease with stage 1 through stage 4 chronic kidney disease, or unspecified chronic kidney disease: Secondary | ICD-10-CM | POA: Diagnosis not present

## 2015-07-14 DIAGNOSIS — N184 Chronic kidney disease, stage 4 (severe): Secondary | ICD-10-CM | POA: Diagnosis not present

## 2015-07-14 DIAGNOSIS — R278 Other lack of coordination: Secondary | ICD-10-CM | POA: Diagnosis not present

## 2015-07-14 NOTE — Telephone Encounter (Signed)
Called patient to request that she send a manual transmission from her [place] home monitor.  Walked patient through transmission steps, transmission pending.  Advised patient that I will call her back mid-morning after her transmission is received.  She is agreeable to this plan and is appreciative of call.

## 2015-07-14 NOTE — Telephone Encounter (Signed)
Returned patient's call.  Advised that I reviewed her transmission with Dr. Lovena Le as Dr. Caryl Comes is not in the office and he did not recommend any immediate intervention at this time.  ECG suggests possible AT, will review with Dr. Caryl Comes when he is back in the office for further recommendations.  Patient verbalizes understanding of all information and is appreciative of call.  She denies additional questions or concerns at this time.

## 2015-07-17 DIAGNOSIS — I69398 Other sequelae of cerebral infarction: Secondary | ICD-10-CM | POA: Diagnosis not present

## 2015-07-17 DIAGNOSIS — Z7984 Long term (current) use of oral hypoglycemic drugs: Secondary | ICD-10-CM | POA: Diagnosis not present

## 2015-07-17 DIAGNOSIS — Z7901 Long term (current) use of anticoagulants: Secondary | ICD-10-CM | POA: Diagnosis not present

## 2015-07-17 DIAGNOSIS — I129 Hypertensive chronic kidney disease with stage 1 through stage 4 chronic kidney disease, or unspecified chronic kidney disease: Secondary | ICD-10-CM | POA: Diagnosis not present

## 2015-07-17 DIAGNOSIS — R278 Other lack of coordination: Secondary | ICD-10-CM | POA: Diagnosis not present

## 2015-07-17 DIAGNOSIS — E1122 Type 2 diabetes mellitus with diabetic chronic kidney disease: Secondary | ICD-10-CM | POA: Diagnosis not present

## 2015-07-17 DIAGNOSIS — N184 Chronic kidney disease, stage 4 (severe): Secondary | ICD-10-CM | POA: Diagnosis not present

## 2015-07-17 DIAGNOSIS — H53451 Other localized visual field defect, right eye: Secondary | ICD-10-CM | POA: Diagnosis not present

## 2015-07-19 DIAGNOSIS — I129 Hypertensive chronic kidney disease with stage 1 through stage 4 chronic kidney disease, or unspecified chronic kidney disease: Secondary | ICD-10-CM | POA: Diagnosis not present

## 2015-07-19 DIAGNOSIS — R278 Other lack of coordination: Secondary | ICD-10-CM | POA: Diagnosis not present

## 2015-07-19 DIAGNOSIS — I69398 Other sequelae of cerebral infarction: Secondary | ICD-10-CM | POA: Diagnosis not present

## 2015-07-19 DIAGNOSIS — H53451 Other localized visual field defect, right eye: Secondary | ICD-10-CM | POA: Diagnosis not present

## 2015-07-19 DIAGNOSIS — E1122 Type 2 diabetes mellitus with diabetic chronic kidney disease: Secondary | ICD-10-CM | POA: Diagnosis not present

## 2015-07-19 DIAGNOSIS — N184 Chronic kidney disease, stage 4 (severe): Secondary | ICD-10-CM | POA: Diagnosis not present

## 2015-07-19 DIAGNOSIS — Z7984 Long term (current) use of oral hypoglycemic drugs: Secondary | ICD-10-CM | POA: Diagnosis not present

## 2015-07-19 DIAGNOSIS — Z7901 Long term (current) use of anticoagulants: Secondary | ICD-10-CM | POA: Diagnosis not present

## 2015-07-25 ENCOUNTER — Encounter: Payer: Self-pay | Admitting: Pharmacist

## 2015-07-25 ENCOUNTER — Other Ambulatory Visit: Payer: Self-pay | Admitting: Pharmacist

## 2015-07-25 NOTE — Patient Outreach (Signed)
[facility]) Care Management  [place]   07/25/2015  Amy Wiggins Jan 21, 1932 [id number]  Subjective: Amy Wiggins is a 82 yo who was referred to [place] for medication assistance.  Per referral, patient reports difficulty with cost of Victoza.  Assisted patient in completing Extra Help application on [id number].    Completed follow up call to patient.  Verified name and date of birth.  Patient reports she got a letter in the mail from social security.  Patient said she does not have the letter in front of her right now, but she does not think she qualified for Extra Help.    Reviewed patient's medicare information on Medicare.gov and confirmed that patient did not receive subsidy.    Objective:   Encounter Medications: Outpatient Encounter Prescriptions as of 07/25/2015  Medication Sig Note  . acetaminophen (TYLENOL) 500 MG tablet Take 1,000 mg by mouth at bedtime. 06/28/2015: Takes as needed. Reports taking 2 tablets a day maximum.   Marland Kitchen aspirin EC 81 MG tablet Take 81 mg by mouth at bedtime. As blood thinner/ for heart   . atorvastatin (LIPITOR) 20 MG tablet Take 20 mg by mouth daily.   . chlorthalidone (HYGROTON) 25 MG tablet Take 25 mg by mouth every morning. For high blood pressure/fluid   . clopidogrel (PLAVIX) 75 MG tablet Take 1 tablet (75 mg total) by mouth once.   Marland Kitchen glimepiride (AMARYL) 2 MG tablet Take 2 mg by mouth daily with breakfast. For Diabetes   . Liraglutide (VICTOZA) 18 MG/3ML SOPN Inject 1.8 mg into the skin at bedtime.    Marland Kitchen losartan (COZAAR) 100 MG tablet Take 100 mg by mouth daily. For high blood pressure   . Multiple Vitamin (MULTIVITAMIN WITH MINERALS) TABS tablet Take 1 tablet by mouth at bedtime. For supplement   . NON FORMULARY Take 1 tablet by mouth at bedtime. "leg cramp relief med"   . pantoprazole (PROTONIX) 40 MG tablet Take 40 mg by mouth daily. For acid reflux/gerd   . pioglitazone (ACTOS) 15 MG tablet Take 15 mg by  mouth daily.   . traZODone (DESYREL) 100 MG tablet Take 100 mg by mouth at bedtime.   Marland Kitchen venlafaxine XR (EFFEXOR-XR) 75 MG 24 hr capsule Take 75 mg by mouth daily with breakfast.    No facility-administered encounter medications on file as of 07/25/2015.    Functional Status: In your present state of health, do you have any difficulty performing the following activities: 06/14/2015 06/14/2015  Hearing? - N  Vision? (No Data) Y  Difficulty concentrating or making decisions? - N  Walking or climbing stairs? - Y  Dressing or bathing? - N  Doing errands, shopping? - Y  Conservation officer, nature and eating ? - N  Using the Toilet? - N  In the past six months, have you accidently leaked urine? - N  Do you have problems with loss of bowel control? - N  Managing your Medications? - N  Managing your Finances? - N  Housekeeping or managing your Housekeeping? - N    Fall/Depression Screening: PHQ 2/9 Scores 06/29/2015 06/14/2015 06/14/2015 06/13/2015  PHQ - 2 Score 2 2 2 2   PHQ- 9 Score 4 4 4 3     Assessment: 1.  Medication assistance:  Patient reports difficulty affording Victoza when she is in the donut hole.  Patient confirms she has all medications at this time.  Patient was not approved for Extra Help.  Discussed Victoza patient assistance program.  To  qualify patient must have applied for and been denied Extra Help and spent $1000 out of pocket on prescription medications in the current calendar year.  Patient thinks she will qualify.  Patient fills all medications at [facility].  Advised patient to get out of pocket expense report from [place] to submit with application.    Plan: 1.  I will mail patient the application for [place] Patient Assistance Program for Victoza along with instructions for how to complete application and what additional information is required.  Will include return envelope for patient to mail back completed application and additional information required.   Will send provider portion of application to [name] for completion.  Once I receive completed application from patient and provider, I will submit to [employer].  Provided patient with my phone number and advised her to call me if she has any questions or concerns.     [facility] CM Care Plan Problem One        Most Recent Value   Care Plan Problem One  Medication cost   Role Documenting the Problem One  Clinical Pharmacist   Care Plan for Problem One  Active   [facility] CM Short Term Goal #1 (0-30 days)  Patient will complete patient assistance program application for Victoza within the next 30 days.   [facility] CM Short Term Goal #1 Start Date  07/25/15   Interventions for Short Term Goal #1  Reviewed requirements for Victoza patient assistance program.  Will mail application to patient with instructions for completion.      Elisabeth Most, Pharm.D. Pharmacy Resident [place] [phone]

## 2015-07-28 ENCOUNTER — Other Ambulatory Visit: Payer: Self-pay | Admitting: Licensed Clinical Social Worker

## 2015-07-28 NOTE — Patient Outreach (Signed)
Assessment:  CSW spoke via phone with client on 07/28/15.  CSW verified client identity. CSW and client spoke of client needs. Client said she had her prescribed medications and is taking medications as prescribed. She said she sees [name] as her primary care doctor. She said she is eating well and sleeping well.  CSW and client spoke of client care plan. CSW encouraged client to make transport arrangements in next 30 days for transport assist to help her go to and from her scheduled medical appointments. Client said that her son had been helping her with her transport needs to medical appointments. She said also that family or friends call her regularly.  She said she is eating well and sleeping well. She said she likes to go fishing as a form of relaxation.  She said she uses reading glasses to help her with her vision.  She said she thinks her vision has improved. She said she is seeing better at present.  She did not report any pain issues.  She said she has appointment with neurosurgeon on 08/08/15.  She said 08/08/15 appointment with neurosurgeon was a check up appointment.  CSW spoke with Joycelyn Schmid about [facility] program support with CSW, nursing and pharmacy support.  CSW encouraged [place] to call CSW at [phone] as needed to discuss social work needs of client.   Plan:  Client to make transport arrangements in next 30 days for transport assist to help her go to and from her scheduled medical appointments. CSW to call client in 4 weeks to assess needs of client at that time.   [name].Cheick Suhr MSW, LCSW Licensed Clinical Social Worker [facility] Care Management [phone]

## 2015-08-07 ENCOUNTER — Ambulatory Visit (INDEPENDENT_AMBULATORY_CARE_PROVIDER_SITE_OTHER): Payer: Medicare Other | Admitting: *Deleted

## 2015-08-07 DIAGNOSIS — Z4509 Encounter for adjustment and management of other cardiac device: Secondary | ICD-10-CM | POA: Diagnosis not present

## 2015-08-07 NOTE — Progress Notes (Signed)
Carelink Summary Report / Loop Recorder 

## 2015-08-08 ENCOUNTER — Encounter: Payer: Self-pay | Admitting: Neurology

## 2015-08-08 ENCOUNTER — Ambulatory Visit (INDEPENDENT_AMBULATORY_CARE_PROVIDER_SITE_OTHER): Payer: Medicare Other | Admitting: Neurology

## 2015-08-08 VITALS — BP 109/55 | HR 51 | Ht 63.0 in | Wt 208.4 lb

## 2015-08-08 DIAGNOSIS — Q2112 Patent foramen ovale: Secondary | ICD-10-CM

## 2015-08-08 DIAGNOSIS — E1159 Type 2 diabetes mellitus with other circulatory complications: Secondary | ICD-10-CM

## 2015-08-08 DIAGNOSIS — Q211 Atrial septal defect: Secondary | ICD-10-CM | POA: Diagnosis not present

## 2015-08-08 DIAGNOSIS — I10 Essential (primary) hypertension: Secondary | ICD-10-CM | POA: Diagnosis not present

## 2015-08-08 DIAGNOSIS — I63431 Cerebral infarction due to embolism of right posterior cerebral artery: Secondary | ICD-10-CM | POA: Insufficient documentation

## 2015-08-08 DIAGNOSIS — E785 Hyperlipidemia, unspecified: Secondary | ICD-10-CM | POA: Insufficient documentation

## 2015-08-08 NOTE — Patient Instructions (Signed)
-   continue ASA and plavis for another one month and then plavix alone - will give you information about research study to see if you interested and we can go ahead - continue lipitor for HLD and stroke prevention - Follow up with your primary care physician for stroke risk factor modification. Recommend maintain blood pressure goal <130/80, diabetes with hemoglobin A1c goal below 6.5% and lipids with LDL cholesterol goal below 70 mg/dL.  - follow up with eye doctor as scheduled - check BP and glucose at home and record - healthy diet and regular exercise - follow up in 3 months.

## 2015-08-08 NOTE — Progress Notes (Signed)
STROKE NEUROLOGY FOLLOW UP NOTE  NAME: Amy Wiggins DOB: 1931-03-05  REASON FOR VISIT: stroke follow up HISTORY FROM: pt and chart  Today we had the pleasure of seeing Amy Wiggins in follow-up at our Neurology Clinic. Pt was accompanied by granddaughter.   History Summary Amy Wiggins is a 82 y.o. female with history of hypertension, hyperlipidemia, chronic kidney disease, diabetes mellitus, and previous TIAs admitted on 06/02/15 for visual changes.Amy Wiggins was found to have left homonymous hemianopia. Amy showed right occipital lobe acute infarct, embolic pattern. MRA, CUS, TTE and LE venous doppler all negative. TEE showed PFO and loop recorder was placed. LDL 81 and A1C 7.7. Amy Wiggins was discharged on plavix and lipitor 20mg .   Interval History During the interval time, the patient has been doing well. No recurrent stroke like symptoms. Amy Wiggins had eye doctor appointment and vision improved to left lower quadrantanopia and was cleared for driving. Amy Wiggins currently taking both ASA 81 and plavix 75mg . BP today 109/55 and glucose at home < 150. No other complains.    REVIEW OF SYSTEMS: Full 14 system review of systems performed and notable only for those listed below and in HPI above, all others are negative:  Constitutional:   Cardiovascular: leg swelling Ear/Nose/Throat:   Skin:  Eyes:  Blurry vision Respiratory:   Gastroitestinal:   Genitourinary:  Hematology/Lymphatic:   Endocrine:  Musculoskeletal:  Back pain Allergy/Immunology:   Neurological:  dizziness Psychiatric:  Sleep: restless leg  The following represents the patient's updated allergies and side effects list: No Known Allergies  The neurologically relevant items on the patient's problem list were reviewed on today's visit.  Neurologic Examination  A problem focused neurological exam (12 or more points of the single system neurologic examination, vital signs counts as 1 point, cranial nerves count for 8  points) was performed.  Blood pressure 109/55, pulse 51, height 5\' 3"  (1.6 m), weight 208 lb 6.4 oz (94.53 kg).  General - Well nourished, well developed, in no apparent distress.  Ophthalmologic - Fundi not visualized due to eye movement.  Cardiovascular - Regular rate and rhythm.  Mental Status -  Level of arousal and orientation to time, place, and person were intact. Language including expression, naming, repetition, comprehension was assessed and found intact. Fund of Knowledge was assessed and was intact.  Cranial Nerves II - XII - II - Visual field exam showed left lower quadrantanopia. III, IV, VI - Extraocular movements intact. V - Facial sensation intact bilaterally. VII - Facial movement intact bilaterally. VIII - Hearing & vestibular intact bilaterally. X - Palate elevates symmetrically. XI - Chin turning & shoulder shrug intact bilaterally. XII - Tongue protrusion intact.  Motor Strength - The patient's strength was normal in all extremities and pronator drift was absent.  Bulk was normal and fasciculations were absent.   Motor Tone - Muscle tone was assessed at the neck and appendages and was normal.  Reflexes - The patient's reflexes were 1+ in all extremities and Amy Wiggins had no pathological reflexes.  Sensory - Light touch, temperature/pinprick were assessed and were normal.    Coordination - The patient had normal movements in the hands and feet with no ataxia or dysmetria.  Tremor was absent  Gait and Station - slow wide based gait, no fall tendency.   Functional score  mRS = 2   0 - No symptoms.   1 - No significant disability. Able to carry out all usual activities, despite some symptoms.  2 - Slight disability. Able to look after own affairs without assistance, but unable to carry out all previous activities.   3 - Moderate disability. Requires some help, but able to walk unassisted.   4 - Moderately severe disability. Unable to attend to own bodily  needs without assistance, and unable to walk unassisted.   5 - Severe disability. Requires constant nursing care and attention, bedridden, incontinent.   6 - Dead.   NIH Stroke Scale   Level Of Consciousness 0=Alert; keenly responsive 1=Not alert, but arousable by minor stimulation 2=Not alert, requires repeated stimulation 3=Responds only with reflex movements 0  LOC Questions to Month and Age 63=Answers both questions correctly 1=Answers one question correctly 2=Answers neither question correctly 0  LOC Commands      -Open/Close eyes     -Open/close grip 0=Performs both tasks correctly 1=Performs one task correctly 2=Performs neighter task correctly 0  Best Gaze 0=Normal 1=Partial gaze palsy 2=Forced deviation, or total gaze paresis 0  Visual 0=No visual loss 1=Partial hemianopia 2=Complete hemianopia 3=Bilateral hemianopia (blind including cortical blindness) 1  Facial Palsy 0=Normal symmetrical movement 1=Minor paralysis (asymmetry) 2=Partial paralysis (lower face) 3=Complete paralysis (upper and lower face) 0  Motor  0=No drift, limb holds posture for full 10 seconds 1=Drift, limb holds posture, no drift to bed 2=Some antigravity effort, cannot maintain posture, drifts to bed 3=No effort against gravity, limb falls 4=No movement Right Arm 0     Leg 0    Left Arm 0     Leg 0  Limb Ataxia 0=Absent 1=Present in one limb 2=Present in two limbs 0  Sensory 0=Normal 1=Mild to moderate sensory loss 2=Severe to total sensory loss 0  Best Language 0=No aphasia, normal 1=Mild to moderate aphasia 2=Mute, global aphasia 3=Mute, global aphasia 0  Dysarthria 0=Normal 1=Mild to moderate 2=Severe, unintelligible or mute/anarthric 0  Extinction/Neglect 0=No abnormality 1=Extinction to bilateral simultaneous stimulation 2=Profound neglect 0  Total   1     Data reviewed: I personally reviewed the images and agree with the radiology interpretations.  Dg Chest 2  View Wiggins  No active cardiopulmonary disease.   Ct Head Wo Contrast Wiggins  1. Acute nonhemorrhagic stroke involving the right occipital lobe.  2. Remote stroke involving the subcortical white matter of the right posterior parietal lobe as noted on the Amy in October, 2012.   Amy Wiggins  1. Acute nonhemorrhagic infarct within the medial right occipital lobe measures 2.5 cm maximally.  2. Remote lacunar infarcts of the basal ganglia and cerebellum bilaterally.  3. Mild age advanced atrophy and white matter disease.  4. Intracranial atherosclerotic changes with mild distal branch vessel irregularity. There is no significant proximal stenosis, aneurysm, or branch vessel occlusion.  5. Mild atherosclerotic changes at the carotid bifurcations bilaterally without a significant stenosis.  6. Flow is antegrade in the vertebral arteries.   Carotid Doppler  There is 1-39% bilateral ICA stenosis. Vertebral artery flow is antegrade.   LE venous doppler - negative for DVT  2D echo  - Left ventricle: The cavity size was normal. There was moderate concentric hypertrophy. Systolic function was vigorous. The estimated ejection fraction was in the range of 65% to 70%. Wall motion was normal; there were no regional wall motion abnormalities. There was an increased relative contribution of atrial contraction to ventricular filling. Doppler parameters are consistent with abnormal left ventricular relaxation (grade 1 diastolic dysfunction). Doppler parameters are consistent with high ventricular filling  pressure. - Aortic valve: Moderate diffuse thickening and calcification, consistent with sclerosis. - Mitral valve: Calcified annulus. Mild diffuse thickening and calcification of the anterior leaflet and posterior leaflet.  TEE - Left Ventrical: Normal LV   Mitral Valve: trace - mild MR, Calcified. No significant stenosis   Aortic Valve:  calcified. No AS or AI  Tricuspid Valve: mild TR   Pulmonic Valve: no PI  Left Atrium/ Left atrial appendage: no thrombi  Atrial septum: aneurismal, + PFO, Significant right to left flow with deep breath   Aorta: mild - moderate plaque  Component     Latest Ref Rng 06/03/2015  Cholesterol     0 - 200 mg/dL 198  Triglycerides     <150 mg/dL 373 (H)  HDL Cholesterol     >40 mg/dL 42  Total CHOL/HDL Ratio      4.7  VLDL     0 - 40 mg/dL 75 (H)  LDL (calc)     0 - 99 mg/dL 81  Hemoglobin A1C     4.8 - 5.6 % 7.7 (H)  Mean Plasma Glucose      174    Assessment: As you may recall, Amy Wiggins is a 82 y.o. Caucasian female with PMH of hypertension, hyperlipidemia, chronic kidney disease, diabetes mellitus, and previous TIAs admitted on 06/02/15 for left homonymous hemianopia. Amy showed right occipital lobe acute infarct, embolic pattern. MRA, CUS, TTE and LE venous doppler all negative. TEE showed PFO and loop recorder was placed. LDL 81 and A1C 7.7. Amy Wiggins was discharged on plavix and lipitor 20mg . During the interval time, the patient has been doing well. Vision improved to left lower quadrantanopia and was cleared for driving by ophthalmology. Amy Wiggins currently taking both ASA 81 and plavix 75mg . Loop so far no afib. Introduce RESPECT ESUS trial.     Plan:  - continue ASA and plavis for total 3 months and then plavix alone - will give you information about research study - continue lipitor for HLD and stroke prevention - Follow up with your primary care physician for stroke risk factor modification. Recommend maintain blood pressure goal <130/80, diabetes with hemoglobin A1c goal below 6.5% and lipids with LDL cholesterol goal below 70 mg/dL.  - follow up with eye doctor as scheduled - check BP and glucose at home and record - healthy diet and regular exercise - follow up in 3 months.   I spent more than 25 minutes of face to face time with the patient. Greater than 50% of time was spent  in counseling and coordination of care. We discussed RESPECT ESUS trial, antiplatelet course, monitoring BP and glucose at home.   No orders of the defined types were placed in this encounter.    No orders of the defined types were placed in this encounter.    Patient Instructions  - continue ASA and plavis for another one month and then plavix alone - will give you information about research study to see if you interested and we can go ahead - continue lipitor for HLD and stroke prevention - Follow up with your primary care physician for stroke risk factor modification. Recommend maintain blood pressure goal <130/80, diabetes with hemoglobin A1c goal below 6.5% and lipids with LDL cholesterol goal below 70 mg/dL.  - follow up with eye doctor as scheduled - check BP and glucose at home and record - healthy diet and regular exercise - follow up in 3 months.     Rosalin Hawking, MD PhD [facility]  [facility] [address] [place],  [address] [phone]

## 2015-08-11 LAB — CUP PACEART REMOTE DEVICE CHECK
Date Time Interrogation Session: 20170611184043
MDC IDC SESS DTM: 20170512180617

## 2015-08-15 ENCOUNTER — Other Ambulatory Visit: Payer: Self-pay | Admitting: Pharmacist

## 2015-08-15 NOTE — Patient Outreach (Signed)
[facility]) Care Management  08/15/2015  SHARLI DILLAHUNT October 08, 1931 [id number]   Amy Wiggins is a 82 yo who was referred to [facility] for medication assistance.  I am assisting patient in completing application for [employer] Patient Assistance Program for [employer].  On 07/25/15, I mailed patient the application along with instructions for how to complete application and what additional information was required.  I also included a return envelope for patient to mail back completed application to me to submit to [employer] with provider portion of the application.  At this time, I have not received information back from patient.    Phone call to follow up with patient.  There was no answer to patient's phone number.  I left a HIPAA compliant voicemail requesting for patient to return my phone call.    Plan:  I will make a second outreach attempt within one week if patient does not return my phone call.     Elisabeth Most, Pharm.D. Pharmacy Resident [place] [phone]

## 2015-08-18 ENCOUNTER — Other Ambulatory Visit: Payer: Self-pay | Admitting: Pharmacist

## 2015-08-18 NOTE — Patient Outreach (Signed)
[facility]) Care Management  [place]   08/18/2015  CHUMY MOMENT 1931/04/12 [id number]  Subjective: Essa Arehart is an 82 yo who was referred to [place] for medication assistance.  I am assisting patient in completing application for [employer] Patient Assistance Program for [employer].  On 07/25/15, I mailed patient the application along with instructions for how to complete application and what additional information was required.  I also included a return envelope for patient to mail back complete application to me to submit to [employer] with provider portion of the application.  At this time, I have not received information back from patient.    Phone call to patient to follow up.  Patient answered and verified name and date of birth.  Patient reports she did not receive application in the mail.  Patient also states she has not asked [place] for out of pocket expense report.  To qualify for Victoza patient assistance program patient must have spent $1000 out of pocket on prescription medications in the current calendar year.    We completed a three way phone call to [place] to determine how much patient has spent out of pocket on prescription medications in the current calendar year.  Identified that patient has not spent $1000 out of pocket on prescription medications this year.    Patient denies any questions or concerns regarding her medications at this time.    Objective:   Encounter Medications: Outpatient Encounter Prescriptions as of 08/18/2015  Medication Sig Note  . acetaminophen (TYLENOL) 500 MG tablet Take 1,000 mg by mouth at bedtime. 06/28/2015: Takes as needed. Reports taking 2 tablets a day maximum.   Marland Kitchen aspirin EC 81 MG tablet Take 81 mg by mouth at bedtime. As blood thinner/ for heart   . atorvastatin (LIPITOR) 20 MG tablet Take 20 mg by mouth daily.   . chlorthalidone (HYGROTON) 25 MG tablet Take 25 mg by  mouth every morning. For high blood pressure/fluid   . clopidogrel (PLAVIX) 75 MG tablet Take 1 tablet (75 mg total) by mouth once.   Marland Kitchen glimepiride (AMARYL) 2 MG tablet Take 2 mg by mouth daily with breakfast. For Diabetes   . Liraglutide (VICTOZA) 18 MG/3ML SOPN Inject 1.8 mg into the skin at bedtime.    Marland Kitchen losartan (COZAAR) 100 MG tablet Take 100 mg by mouth daily. For high blood pressure   . Multiple Vitamin (MULTIVITAMIN WITH MINERALS) TABS tablet Take 1 tablet by mouth at bedtime. For supplement   . NON FORMULARY Take 1 tablet by mouth at bedtime. "leg cramp relief med"   . pantoprazole (PROTONIX) 40 MG tablet Take 40 mg by mouth daily. For acid reflux/gerd   . pioglitazone (ACTOS) 15 MG tablet Take 15 mg by mouth daily.   . traZODone (DESYREL) 100 MG tablet Take 100 mg by mouth at bedtime.   Marland Kitchen venlafaxine XR (EFFEXOR-XR) 75 MG 24 hr capsule Take 75 mg by mouth daily with breakfast.    No facility-administered encounter medications on file as of 08/18/2015.    Functional Status: In your present state of health, do you have any difficulty performing the following activities: 07/28/2015 06/14/2015  Hearing? N -  Vision? Y (No Data)  Difficulty concentrating or making decisions? N -  Walking or climbing stairs? Y -  Dressing or bathing? N -  Doing errands, shopping? Y -  Conservation officer, nature and eating ? - -  Using the Toilet? - -  In  the past six months, have you accidently leaked urine? - -  Do you have problems with loss of bowel control? - -  Managing your Medications? - -  Managing your Finances? - -  Housekeeping or managing your Housekeeping? - -    Fall/Depression Screening: PHQ 2/9 Scores 07/28/2015 06/29/2015 06/14/2015 06/14/2015 06/13/2015  PHQ - 2 Score 2 2 2 2 2   PHQ- 9 Score 4 4 4 4 3     Assessment: 1.  Medication assistance:  Patient reports difficulty affording Victoza when she is in the donut hole.  Patient confirms she has all medications at this time.  Patient was not  approved for Extra Help, and identified that patient does not qualify for Vict[employer]za patient assistance at this time.    Plan: 1.  I will close pharmacy program at this time as no further intervention needed.  I advised patient to contact me when she spends $1000 out of pocket on prescription medications as she will then qualify for [employer] patient assistance program.  Patient voiced understanding.  I provided patient with my phone number.  I will update [facility] Licensed Clinical Social Worker, Theadore Nan of pharmacy closure.     Elisabeth Most, Pharm.D. Pharmacy Resident [place] [phone]

## 2015-08-25 ENCOUNTER — Other Ambulatory Visit: Payer: Self-pay | Admitting: Licensed Clinical Social Worker

## 2015-08-25 NOTE — Patient Outreach (Signed)
Assessment:  CSW spoke via phone with client on 08/25/15. CSW verified client identity. CSW received verbal permission from client on 08/25/15 for CSW to speak with client about current client needs. Client said she is eating well and sleeping well. She said she has her prescribed medications and is taking medications as prescribed. CSW and client spoke of client care plan. CSW encouraged client to make transport arrangemens in the  next 30 days for upcoming client medical appointments transport assistance.  Client said that she had not had to call Aging,  Disability and Transient Services recently for transport help since her son has been transporting client to and from all of client's scheduled medical appointments. Also, client said she now sometimes drives herself to her scheduled medical appointments. She said she was doing well with transport needs management. She drives herself to some of her appointments; and, her son also drives her to some of her medical appointments. Client said she sees [name] as her primary care doctor. She said she has appointment with Dr .Willey Blade scheduled for September 07, 2015. Client said she is trying to pay monthly bills on time each month. Monthly cost of Victoza is expensive for client to pay.  Client previously worked with Elisabeth Most, [facility] pharmacist, regarding medication assistance options for client. Client has Medicare but not Medicaid.  Client said she has support from her son, Nalanie Sloma.  CSW thanked client for phone call with CSW on 08/25/15. CSW encouraged Steven to call CSW at [phone] as needed to discuss social work needs of client.  Plan:  Client to make transport arrangements in next 30 days for upcoming client medical appointments transport assistance. CSW to call client in 4 weeks to assess needs of client at that time.  [name].Alahni Varone MSW, LCSW Licensed Clinical Social Worker [facility] Care Management [phone]

## 2015-08-28 DIAGNOSIS — I10 Essential (primary) hypertension: Secondary | ICD-10-CM | POA: Diagnosis not present

## 2015-08-28 DIAGNOSIS — Z79899 Other long term (current) drug therapy: Secondary | ICD-10-CM | POA: Diagnosis not present

## 2015-08-28 DIAGNOSIS — E119 Type 2 diabetes mellitus without complications: Secondary | ICD-10-CM | POA: Diagnosis not present

## 2015-08-28 DIAGNOSIS — E785 Hyperlipidemia, unspecified: Secondary | ICD-10-CM | POA: Diagnosis not present

## 2015-08-30 ENCOUNTER — Encounter: Payer: Self-pay | Admitting: Internal Medicine

## 2015-08-30 ENCOUNTER — Telehealth: Payer: Self-pay | Admitting: *Deleted

## 2015-08-30 NOTE — Telephone Encounter (Signed)
Spoke with Amy Wiggins regarding "tachy" episode noted 08/29/15 2050. She reports that she has felt as if she "is going to smother to death" for the last month. I clarified that she is feeling shortness of breath, she confirms. She does not sound short of breath over the phone. She felt as if her heart rate was increased at the time of the episode and that she was dizzy upon standing to go to bed, which resolved quickly. She verifies that she is taking her cozaar as prescribed. I will review episode with Dr. Caryl Comes upon his return to the office and we will reach out to her if he has further recommendations. She verbalizes understanding and is appreciative.

## 2015-08-31 NOTE — Telephone Encounter (Signed)
Reviewed episode with Dr. Caryl Comes- atrial fibrillation >2 hours on linq. He recommends following up with Roderic Palau, NP in [facility]. Ms. Pellett is aware of arrhythmia and Dr. Olin Pia suggestion. Lorenda Hatchet to call patient to arrange follow up. She denies further questions and is appreciative of call.

## 2015-09-04 DIAGNOSIS — I639 Cerebral infarction, unspecified: Secondary | ICD-10-CM | POA: Diagnosis not present

## 2015-09-04 DIAGNOSIS — E1129 Type 2 diabetes mellitus with other diabetic kidney complication: Secondary | ICD-10-CM | POA: Diagnosis not present

## 2015-09-04 DIAGNOSIS — N184 Chronic kidney disease, stage 4 (severe): Secondary | ICD-10-CM | POA: Diagnosis not present

## 2015-09-05 ENCOUNTER — Ambulatory Visit (HOSPITAL_COMMUNITY)
Admission: RE | Admit: 2015-09-05 | Discharge: 2015-09-05 | Disposition: A | Discharge status: Home / Self Care | Payer: Medicare Other | Source: Ambulatory Visit | Attending: Nurse Practitioner | Admitting: Nurse Practitioner

## 2015-09-05 ENCOUNTER — Encounter (HOSPITAL_COMMUNITY): Payer: Self-pay | Admitting: Nurse Practitioner

## 2015-09-05 ENCOUNTER — Other Ambulatory Visit: Payer: Self-pay

## 2015-09-05 ENCOUNTER — Ambulatory Visit (INDEPENDENT_AMBULATORY_CARE_PROVIDER_SITE_OTHER): Payer: Medicare Other | Admitting: *Deleted

## 2015-09-05 VITALS — BP 130/82 | HR 98 | Ht 63.0 in | Wt 207.0 lb

## 2015-09-05 DIAGNOSIS — Z79899 Other long term (current) drug therapy: Secondary | ICD-10-CM | POA: Diagnosis not present

## 2015-09-05 DIAGNOSIS — I480 Paroxysmal atrial fibrillation: Secondary | ICD-10-CM | POA: Diagnosis not present

## 2015-09-05 DIAGNOSIS — F329 Major depressive disorder, single episode, unspecified: Secondary | ICD-10-CM | POA: Diagnosis not present

## 2015-09-05 DIAGNOSIS — Z9889 Other specified postprocedural states: Secondary | ICD-10-CM | POA: Diagnosis not present

## 2015-09-05 DIAGNOSIS — Z8673 Personal history of transient ischemic attack (TIA), and cerebral infarction without residual deficits: Secondary | ICD-10-CM | POA: Insufficient documentation

## 2015-09-05 DIAGNOSIS — Z4509 Encounter for adjustment and management of other cardiac device: Secondary | ICD-10-CM

## 2015-09-05 DIAGNOSIS — E119 Type 2 diabetes mellitus without complications: Secondary | ICD-10-CM | POA: Insufficient documentation

## 2015-09-05 DIAGNOSIS — Z7901 Long term (current) use of anticoagulants: Secondary | ICD-10-CM | POA: Insufficient documentation

## 2015-09-05 DIAGNOSIS — Z7902 Long term (current) use of antithrombotics/antiplatelets: Secondary | ICD-10-CM | POA: Insufficient documentation

## 2015-09-05 DIAGNOSIS — I129 Hypertensive chronic kidney disease with stage 1 through stage 4 chronic kidney disease, or unspecified chronic kidney disease: Secondary | ICD-10-CM | POA: Diagnosis not present

## 2015-09-05 MED ORDER — APIXABAN 2.5 MG PO TABS: 2.5000 mg | ORAL_TABLET | Freq: Two times a day (BID) | ORAL | Status: DC

## 2015-09-05 NOTE — Patient Instructions (Signed)
Your physician has recommended you make the following change in your medication:  STOP aspirin; STOP plavix  START Eliquis 2.5 mg twice daily  Your physician recommends that you schedule a follow-up appointment in:  1 month; this appt has been made

## 2015-09-06 ENCOUNTER — Encounter (HOSPITAL_COMMUNITY): Payer: Self-pay | Admitting: Nurse Practitioner

## 2015-09-06 NOTE — Progress Notes (Signed)
Carelink Summary Report / Loop Recorder 

## 2015-09-06 NOTE — Progress Notes (Signed)
Patient ID: Amy Wiggins, female   DOB: [birth year], 82 y.o.   MRN: [id number]     Primary Care Physician: Asencion Noble, MD Referring Physician: Dr. Lenoria Chime is a 82 y.o. female with a h/o past medical history of hypertension, diabetes, TIA, presented to [facility], 4/7, with vision changes . She was found to have right occipital acute stroke. She was hospitalized for further management.MRIahowed acute infarct at the medial right occipital lobe. - MRA no significant stenosis or occlusion. - Carotid Doppler No significant stenosis - 2D Echo EF 65-70%. No cardiac source of emboli identified.- LE venous doppler - no DVT- TEE showed PFO and Loop placed per cardiology .She was d/c on  aspirin and clopidogrel 75 mg daily.  She is being seen in afib clinic 7/67for afib found on loop monitor 7/4 which consisted of over 2 hours of afib. She was not aware of palpitations at the time. She does have some chronic shortness of breath with exertion and when first lying down at night which is chronic and unchanged and precedes stroke. She denies a bleeding history and is steady on her feet. Her sisiter who also has afib and is on blood thinner and is a patient of the clinic is with her today.  Today, she denies symptoms of palpitations, chest pain,, orthopnea, PND, lower extremity edema, dizziness, presyncope, syncope, or neurologic sequela. The patient is tolerating medications without difficulties and is otherwise without complaint today.   Past Medical History  Diagnosis Date  . Hypertension   . Diabetes mellitus   . Stroke ([place])     TIA's x 2  . Depression    Past Surgical History  Procedure Laterality Date  . Colonoscopy    . Colonoscopy N/A 09/09/2014    Procedure: COLONOSCOPY;  Surgeon: Rogene Houston, MD;  [facility];  Service: Endoscopy;  Laterality: N/A;  1010 - moved to 7:30 - Ann to notify  . Ep implantable device N/A 06/07/2015    Procedure: Loop Recorder  Insertion;  Surgeon: Deboraha Sprang, MD;  Location: [place] CV LAB;  Service: Cardiovascular;  Laterality: N/A;  . Tee without cardioversion N/A 06/07/2015    Procedure: TRANSESOPHAGEAL ECHOCARDIOGRAM (TEE);  Surgeon: Thayer Headings, MD;  Location: [facility] ENDOSCOPY;  Service: Cardiovascular;  Laterality: N/A;    Current Outpatient Prescriptions  Medication Sig Dispense Refill  . acetaminophen (TYLENOL) 500 MG tablet Take 1,000 mg by mouth at bedtime.    Marland Kitchen atorvastatin (LIPITOR) 20 MG tablet Take 20 mg by mouth daily.    . chlorthalidone (HYGROTON) 25 MG tablet Take 25 mg by mouth every morning. For high blood pressure/fluid    . glimepiride (AMARYL) 2 MG tablet Take 2 mg by mouth daily with breakfast. For Diabetes    . Liraglutide (VICTOZA) 18 MG/3ML SOPN Inject 1.8 mg into the skin at bedtime.     Marland Kitchen losartan (COZAAR) 100 MG tablet Take 100 mg by mouth daily. For high blood pressure    . magnesium oxide (MAG-OX) 400 MG tablet Take 400 mg by mouth daily.    . Multiple Vitamin (MULTIVITAMIN WITH MINERALS) TABS tablet Take 1 tablet by mouth at bedtime. For supplement    . pantoprazole (PROTONIX) 40 MG tablet Take 40 mg by mouth daily. For acid reflux/gerd    . pioglitazone (ACTOS) 15 MG tablet Take 15 mg by mouth daily.    . traZODone (DESYREL) 100 MG tablet Take 100 mg by mouth at bedtime.    Marland Kitchen  venlafaxine XR (EFFEXOR-XR) 75 MG 24 hr capsule Take 75 mg by mouth daily with breakfast.    . apixaban (ELIQUIS) 2.5 MG TABS tablet Take 1 tablet (2.5 mg total) by mouth 2 (two) times daily. 60 tablet 3   No current facility-administered medications for this encounter.    No Known Allergies  Social History   Social History  . Marital Status: Widowed    Spouse Name: N/A  . Number of Children: N/A  . Years of Education: N/A   Occupational History  . Not on file.   Social History Main Topics  . Smoking status: Never Smoker   . Smokeless tobacco: Not on file  . Alcohol Use: No  . Drug Use:  No  . Sexual Activity: No   Other Topics Concern  . Not on file   Social History Narrative    Family History  Problem Relation Age of Onset  . Stroke Father   . Dementia Sister     ROS- All systems are reviewed and negative except as per the HPI above  Physical Exam: Filed Vitals:   09/05/15 1426  BP: 130/82  Pulse: 98  Height: 5\' 3"  (1.6 m)  Weight: 207 lb (93.895 kg)    GEN- The patient is well appearing, alert and oriented x 3 today.   Head- normocephalic, atraumatic Eyes-  Sclera clear, conjunctiva pink Ears- hearing intact Oropharynx- clear Neck- supple, no JVP Lymph- no cervical lymphadenopathy Lungs- Clear to ausculation bilaterally, normal work of breathing Heart- Regular rate and rhythm, no murmurs, rubs or gallops, PMI not laterally displaced GI- soft, NT, ND, + BS Extremities- no clubbing, cyanosis, or edema MS- no significant deformity or atrophy Skin- no rash or lesion Psych- euthymic mood, full affect Neuro- strength and sensation are intact  EKG- SR with LAD, pr int 182 ms, qrs int 92 ms, qtc 454 ms Notes reviewed describing afib occurrence Epic records reviewed    Assessment and Plan: 1. PAF in the setting of Acute CVA 4/17 Discussed with pt reason to stop antiplatelet and start anticoagulant for stroke prevention in the setting of afib found on Loop monitor. She denies bleeding history Discussed warfarin, eliquis and xarelto Since sister on eliquis wants to try this drug She does have chronic kidney disease and with creat mostly over 1.5 and age over 63 will need 2.5 mg dose She knows to stop asa and plavix Bleeding precautions discussed No awareness of occurrence of afib and low afib burden for now so do not feel compelled to start rate control.  F/u in one month  Butch Penny C. Jamoni Broadfoot, [facility] [address] [place], [place] [address] [phone]

## 2015-09-11 ENCOUNTER — Telehealth (HOSPITAL_COMMUNITY): Payer: Self-pay | Admitting: *Deleted

## 2015-09-11 NOTE — Telephone Encounter (Signed)
PA for pts Eliquis 2.5 mg approved through 02/25/2016.  Ref # [id number]

## 2015-09-25 ENCOUNTER — Other Ambulatory Visit: Payer: Self-pay | Admitting: Licensed Clinical Social Worker

## 2015-09-25 NOTE — Patient Outreach (Signed)
Assessment:  CSW spoke via phone with client on 09/25/15. CSW verified client identity.CSW received verbal permission from client on 09/25/15 for CSW to speak with client about current needs of client.  Client said she is eating well and sleeping well. She has her prescribed medications and is taking medications as prescribed.Client sees Dr. Asencion Noble as primary care doctor. CSW and client spoke of client care plan. CSW encouraged client to make needed transport arrangements for client to attend all scheduled client medical appointments in the next 30 days. Client said her son sometimes transports client to and from client's scheduled medical appointments.  She said she had not had to call Aging, Disability and Transient Services recently to help her with transport support needs. Client has Medicare but not Medicaid.  Client has had a recent appointment with her cardiologist.  CSW thanked client for phone call with CSW; CSW encouraged that client or her son please call CSW at [phone] as needed to further discuss the social work needs of client.   Plan:  Client to make needed transport arrangements for client to attend all scheduled client medical appointments in the next 30 days.  CSW to call client in 4 weeks to assess client needs at that time.  [name].Domini Vandehei MSW, LCSW Licensed Clinical Social Worker [facility] Care Management [phone]

## 2015-10-05 ENCOUNTER — Ambulatory Visit (INDEPENDENT_AMBULATORY_CARE_PROVIDER_SITE_OTHER): Payer: Medicare Other | Admitting: *Deleted

## 2015-10-05 DIAGNOSIS — Z4509 Encounter for adjustment and management of other cardiac device: Secondary | ICD-10-CM | POA: Diagnosis not present

## 2015-10-09 NOTE — Progress Notes (Signed)
Carelink Summary Report / Loop Recorder 

## 2015-10-10 ENCOUNTER — Ambulatory Visit (HOSPITAL_COMMUNITY)
Admission: RE | Admit: 2015-10-10 | Discharge: 2015-10-10 | Disposition: A | Discharge status: Home / Self Care | Payer: Medicare Other | Source: Ambulatory Visit | Attending: Nurse Practitioner | Admitting: Nurse Practitioner

## 2015-10-10 ENCOUNTER — Encounter (HOSPITAL_COMMUNITY): Payer: Self-pay | Admitting: Nurse Practitioner

## 2015-10-10 DIAGNOSIS — I480 Paroxysmal atrial fibrillation: Secondary | ICD-10-CM | POA: Insufficient documentation

## 2015-10-10 DIAGNOSIS — I4891 Unspecified atrial fibrillation: Secondary | ICD-10-CM | POA: Diagnosis present

## 2015-10-10 DIAGNOSIS — I10 Essential (primary) hypertension: Secondary | ICD-10-CM | POA: Diagnosis not present

## 2015-10-10 DIAGNOSIS — Z7984 Long term (current) use of oral hypoglycemic drugs: Secondary | ICD-10-CM | POA: Diagnosis not present

## 2015-10-10 DIAGNOSIS — F329 Major depressive disorder, single episode, unspecified: Secondary | ICD-10-CM | POA: Insufficient documentation

## 2015-10-10 DIAGNOSIS — Z823 Family history of stroke: Secondary | ICD-10-CM | POA: Insufficient documentation

## 2015-10-10 DIAGNOSIS — Z7901 Long term (current) use of anticoagulants: Secondary | ICD-10-CM | POA: Diagnosis not present

## 2015-10-10 DIAGNOSIS — E119 Type 2 diabetes mellitus without complications: Secondary | ICD-10-CM | POA: Diagnosis not present

## 2015-10-10 DIAGNOSIS — Z8673 Personal history of transient ischemic attack (TIA), and cerebral infarction without residual deficits: Secondary | ICD-10-CM | POA: Diagnosis not present

## 2015-10-10 DIAGNOSIS — Z79899 Other long term (current) drug therapy: Secondary | ICD-10-CM | POA: Diagnosis not present

## 2015-10-10 MED ORDER — APIXABAN 2.5 MG PO TABS: 2.5000 mg | ORAL_TABLET | Freq: Two times a day (BID) | ORAL | 11 refills | Status: DC

## 2015-10-10 NOTE — Progress Notes (Signed)
Patient ID: Amy Wiggins, female   DOB: [birth year], 82 y.o.   MRN: [id number]     Primary Care Physician: Asencion Noble, MD Referring Physician: Dr. Lenoria Chime is a 82 y.o. female with a h/o past medical history of hypertension, diabetes, TIA, presented to [facility], 4/7, with vision changes . She was found to have right occipital acute stroke. She was hospitalized for further management.MRIahowed acute infarct at the medial right occipital lobe. - MRA no significant stenosis or occlusion. - Carotid Doppler No significant stenosis - 2D Echo EF 65-70%. No cardiac source of emboli identified.- LE venous doppler - no DVT- TEE showed PFO and Loop placed per cardiology .She was d/c on  aspirin and clopidogrel 75 mg daily.  She is being seen in afib clinic 7/22for afib found on loop monitor 7/4 which consisted of over 2 hours of afib. She was not aware of palpitations at the time. She does have some chronic shortness of breath with exertion and when first lying down at night which is chronic and unchanged and precedes stroke. She denies a bleeding history and is steady on her feet. Her sisiter who also has afib and is on blood thinner and is a patient of the clinic is with her today.  Today, she denies symptoms of palpitations, chest pain,, orthopnea, PND, lower extremity edema, dizziness, presyncope, syncope, or neurologic sequela. The patient is tolerating medications without difficulties and is otherwise without complaint today.   Past Medical History:  Diagnosis Date  . Depression   . Diabetes mellitus   . Hypertension   . Stroke ([place])    TIA's x 2   Past Surgical History:  Procedure Laterality Date  . COLONOSCOPY    . COLONOSCOPY N/A 09/09/2014   Procedure: COLONOSCOPY;  Surgeon: Rogene Houston, MD;  [facility];  Service: Endoscopy;  Laterality: N/A;  1010 - moved to 7:30 - Ann to notify  . EP IMPLANTABLE DEVICE N/A 06/07/2015   Procedure: Loop Recorder  Insertion;  Surgeon: Deboraha Sprang, MD;  Location: [place] CV LAB;  Service: Cardiovascular;  Laterality: N/A;  . TEE WITHOUT CARDIOVERSION N/A 06/07/2015   Procedure: TRANSESOPHAGEAL ECHOCARDIOGRAM (TEE);  Surgeon: Thayer Headings, MD;  Location: [facility] ENDOSCOPY;  Service: Cardiovascular;  Laterality: N/A;    Current Outpatient Prescriptions  Medication Sig Dispense Refill  . acetaminophen (TYLENOL) 500 MG tablet Take 1,000 mg by mouth at bedtime.    Marland Kitchen apixaban (ELIQUIS) 2.5 MG TABS tablet Take 1 tablet (2.5 mg total) by mouth 2 (two) times daily. 60 tablet 3  . atorvastatin (LIPITOR) 20 MG tablet Take 20 mg by mouth daily.    . chlorthalidone (HYGROTON) 25 MG tablet Take 25 mg by mouth every morning. For high blood pressure/fluid    . glimepiride (AMARYL) 2 MG tablet Take 2 mg by mouth daily with breakfast. For Diabetes    . Liraglutide (VICTOZA) 18 MG/3ML SOPN Inject 1.8 mg into the skin at bedtime.     Marland Kitchen losartan (COZAAR) 100 MG tablet Take 100 mg by mouth daily. For high blood pressure    . magnesium oxide (MAG-OX) 400 MG tablet Take 400 mg by mouth daily.    . Multiple Vitamin (MULTIVITAMIN WITH MINERALS) TABS tablet Take 1 tablet by mouth at bedtime. For supplement    . pantoprazole (PROTONIX) 40 MG tablet Take 40 mg by mouth daily. For acid reflux/gerd    . pioglitazone (ACTOS) 15 MG tablet Take 15 mg by  mouth daily.    . traZODone (DESYREL) 100 MG tablet Take 100 mg by mouth at bedtime.    Marland Kitchen venlafaxine XR (EFFEXOR-XR) 75 MG 24 hr capsule Take 75 mg by mouth daily with breakfast.     No current facility-administered medications for this encounter.     No Known Allergies  Social History   Social History  . Marital status: Widowed    Spouse name: N/A  . Number of children: N/A  . Years of education: N/A   Occupational History  . Not on file.   Social History Main Topics  . Smoking status: Never Smoker  . Smokeless tobacco: Not on file  . Alcohol use No  . Drug use: No   . Sexual activity: No   Other Topics Concern  . Not on file   Social History Narrative  . No narrative on file    Family History  Problem Relation Age of Onset  . Stroke Father   . Dementia Sister     ROS- All systems are reviewed and negative except as per the HPI above  Physical Exam: There were no vitals filed for this visit.  GEN- The patient is well appearing, alert and oriented x 3 today.   Head- normocephalic, atraumatic Eyes-  Sclera clear, conjunctiva pink Ears- hearing intact Oropharynx- clear Neck- supple, no JVP Lymph- no cervical lymphadenopathy Lungs- Clear to ausculation bilaterally, normal work of breathing Heart- Regular rate and rhythm, no murmurs, rubs or gallops, PMI not laterally displaced GI- soft, NT, ND, + BS Extremities- no clubbing, cyanosis, or edema MS- no significant deformity or atrophy Skin- no rash or lesion Psych- euthymic mood, full affect Neuro- strength and sensation are intact  EKG- Stach with LAFD, pr int 204 ms, qrs int 86 ms, qtc 464 ms, PAC's Remote reviewed    Assessment and Plan: 1. PAF in the setting of Acute CVA 4/17 Doing well on apixaban 2.5 mg bid( creatinine mostly over 1.5, age greater than 39) No bleeding issues Cost a concern and will assist with financial aid forms for eliquis Off  asa and plavix No awareness of occurrence of afib and low afib burden for now so do not feel compelled to start rate control. Heart rate initially  elevated with walking but in the 80's at rest, not aware of tach  F/u afib clinic as needed Dr. Erlinda Hong 9/18  Butch Penny C. Aly Hauser, [facility] [address] [place], [place] [address] [phone]

## 2015-10-12 LAB — CUP PACEART REMOTE DEVICE CHECK: MDC IDC SESS DTM: 20170711194233

## 2015-10-20 ENCOUNTER — Other Ambulatory Visit (HOSPITAL_COMMUNITY): Payer: Self-pay | Admitting: *Deleted

## 2015-10-20 DIAGNOSIS — I480 Paroxysmal atrial fibrillation: Secondary | ICD-10-CM

## 2015-10-20 MED ORDER — APIXABAN 2.5 MG PO TABS: 2.5000 mg | ORAL_TABLET | Freq: Two times a day (BID) | ORAL | 3 refills | Status: DC

## 2015-10-26 ENCOUNTER — Other Ambulatory Visit: Payer: Self-pay | Admitting: Licensed Clinical Social Worker

## 2015-10-26 LAB — CUP PACEART REMOTE DEVICE CHECK: MDC IDC SESS DTM: 20170810200557

## 2015-10-26 NOTE — Patient Outreach (Signed)
Assessment:  CSW spoke via phone with client on 10/26/15. CSW verified client identity. CSW received verbal permission from client on 10/26/15 for CSW to speak witih client about current client needs.CSW and client spoke of client needs. Client said she is eating well and sleeping well. She said she had her prescribed medications and is taking medications as prescribed. Client sees Dr. Asencion Noble as primary care doctor. Client and CSW spoke of client care plan. CSW encouraged client to make needed transport arrangements  to transport client to and from client's scheduled medical appointments in next 30 days. Client said that her son will sometimes help transport client to and from her scheduled medical appointments. Client sees cardiologist as scheduled.  Client said she is trying to pay her monthly bills on time each month.  CSW encouraged client or her son, Jeneen Rinks, to call CSW as needed at [phone] to discuss social work needs of client.     Plan:  Client to make needed transport arrangements to transport client to and from client's scheduled medical appointments in next 30 days.  CSW to call client in 4 weeks to assess client needs.   [name].Eward Rutigliano MSW, LCSW Licensed Clinical Social Worker [facility] Care Management [phone]

## 2015-10-26 NOTE — Progress Notes (Signed)
Carelink summary report received. Battery status OK. Normal device function. No new symptom episodes, brady, or pause episodes. 1032 AF episodes 18.8% +Eliquis. Unviewed ECGs appear SR PACs/PVCs. 2 tachy- ECGs appear SVT/PAT. See ECGs Monthly summary reports and ROV/PRN

## 2015-10-31 ENCOUNTER — Encounter: Payer: Self-pay | Admitting: Internal Medicine

## 2015-11-06 ENCOUNTER — Ambulatory Visit (INDEPENDENT_AMBULATORY_CARE_PROVIDER_SITE_OTHER): Payer: Medicare Other | Admitting: *Deleted

## 2015-11-06 DIAGNOSIS — Z4509 Encounter for adjustment and management of other cardiac device: Secondary | ICD-10-CM | POA: Diagnosis not present

## 2015-11-06 NOTE — Progress Notes (Signed)
Carelink Summary Report / Loop Recorder 

## 2015-11-13 ENCOUNTER — Encounter: Payer: Self-pay | Admitting: Neurology

## 2015-11-13 ENCOUNTER — Ambulatory Visit (INDEPENDENT_AMBULATORY_CARE_PROVIDER_SITE_OTHER): Payer: Medicare Other | Admitting: Neurology

## 2015-11-13 VITALS — BP 105/68 | HR 58 | Ht 63.0 in | Wt 207.2 lb

## 2015-11-13 DIAGNOSIS — E785 Hyperlipidemia, unspecified: Secondary | ICD-10-CM

## 2015-11-13 DIAGNOSIS — I10 Essential (primary) hypertension: Secondary | ICD-10-CM | POA: Diagnosis not present

## 2015-11-13 DIAGNOSIS — Q2112 Patent foramen ovale: Secondary | ICD-10-CM

## 2015-11-13 DIAGNOSIS — I480 Paroxysmal atrial fibrillation: Secondary | ICD-10-CM

## 2015-11-13 DIAGNOSIS — E1159 Type 2 diabetes mellitus with other circulatory complications: Secondary | ICD-10-CM

## 2015-11-13 DIAGNOSIS — Z7901 Long term (current) use of anticoagulants: Secondary | ICD-10-CM | POA: Diagnosis not present

## 2015-11-13 DIAGNOSIS — I63431 Cerebral infarction due to embolism of right posterior cerebral artery: Secondary | ICD-10-CM

## 2015-11-13 DIAGNOSIS — Q211 Atrial septal defect: Secondary | ICD-10-CM

## 2015-11-13 NOTE — Progress Notes (Signed)
STROKE NEUROLOGY FOLLOW UP NOTE  NAME: Amy Wiggins DOB: 1931/03/11  REASON FOR VISIT: stroke follow up HISTORY FROM: pt and chart  Today we had the pleasure of seeing Amy Wiggins in follow-up at our [facility]rology [facility]. Pt was accompanied by granddaughter.   History Summary Amy Wiggins is a 82 y.o. female with history of hypertension, hyperlipidemia, chronic kidney disease, diabetes mellitus, and previous TIAs admitted on 06/02/15 for visual changes.she was found to have left homonymous hemianopia. MRI showed right occipital lobe acute infarct, embolic pattern. MRA, CUS, TTE and LE venous doppler all negative. TEE showed PFO and loop recorder was placed. LDL 81 and A1C 7.7. She was discharged on plavix and lipitor 20mg .   08/08/15 follow up - the patient has been doing well. No recurrent stroke like symptoms. She had eye doctor appointment and vision improved to left lower quadrantanopia and was cleared for driving. She currently taking both ASA 81 and plavix 75mg . BP today 109/55 and glucose at home < 150. No other complains.  Interval History During the interval time, pt has been doing well without recurrent stroke like symptoms. Her loop recorder showed afib on 09/05/15 and was put on eliquis by cardiology. However, the eliquis dose was 2.5mg  bid. She tolerated well without bleeding side effects. Still has left lower quadrantanopia but resumed driving without problem. BP today 105/68. Glucose at home 120s. Has follow up schedule with ophthalmology.   REVIEW OF SYSTEMS: Full 14 system review of systems performed and notable only for those listed below and in HPI above, all others are negative:  Constitutional:  Excessive sweating Cardiovascular:  Ear/Nose/Throat:   Skin: mole, itching Eyes:  Blurry vision, eye itching Respiratory:   Gastroitestinal:   Genitourinary:  Hematology/Lymphatic:   Endocrine:  Musculoskeletal:   Allergy/Immunology:   Neurological:   dizziness Psychiatric:  Sleep: restless leg, frequent waking  The following represents the patient's updated allergies and side effects list: No Known Allergies  The neurologically relevant items on the patient's problem list were reviewed on today's visit.  Neurologic Examination  A problem focused neurological exam (12 or more points of the single system neurologic examination, vital signs counts as 1 point, cranial nerves count for 8 points) was performed.  Blood pressure 105/68, pulse (!) 58, height 5\' 3"  (1.6 m), weight 94 kg (207 lb 3.2 oz).  General - Well nourished, well developed, in no apparent distress.  Ophthalmologic - Fundi not visualized due to eye movement.  Cardiovascular - Regular rate and rhythm.  Mental Status -  Level of arousal and orientation to time, place, and person were intact. Language including expression, naming, repetition, comprehension was assessed and found intact. Fund of Knowledge was assessed and was intact.  Cranial Nerves II - XII - II - Visual field exam showed left lower quadrantanopia. III, IV, VI - Extraocular movements intact. V - Facial sensation intact bilaterally. VII - Facial movement intact bilaterally. VIII - Hearing & vestibular intact bilaterally. X - Palate elevates symmetrically. XI - Chin turning & shoulder shrug intact bilaterally. XII - Tongue protrusion intact.  Motor Strength - The patient's strength was normal in all extremities and pronator drift was absent.  Bulk was normal and fasciculations were absent.   Motor Tone - Muscle tone was assessed at the neck and appendages and was normal.  Reflexes - The patient's reflexes were 1+ in all extremities and she had no pathological reflexes.  Sensory - Light touch, temperature/pinprick were assessed and were  normal.    Coordination - The patient had normal movements in the hands and feet with no ataxia or dysmetria.  Tremor was absent  Gait and Station - slow wide  based gait, no fall tendency.   Data reviewed: I personally reviewed the images and agree with the radiology interpretations.  Dg Chest 2 View 06/02/2015  No active cardiopulmonary disease.   Ct Head Wo Contrast 06/02/2015  1. Acute nonhemorrhagic stroke involving the right occipital lobe.  2. Remote stroke involving the subcortical white matter of the right posterior parietal lobe as noted on the MRI in October, 2012.   Mri & Mra Head and Mra Neck Wo Contrast 06/02/2015  1. Acute nonhemorrhagic infarct within the medial right occipital lobe measures 2.5 cm maximally.  2. Remote lacunar infarcts of the basal ganglia and cerebellum bilaterally.  3. Mild age advanced atrophy and white matter disease.  4. Intracranial atherosclerotic changes with mild distal branch vessel irregularity. There is no significant proximal stenosis, aneurysm, or branch vessel occlusion.  5. Mild atherosclerotic changes at the carotid bifurcations bilaterally without a significant stenosis.  6. Flow is antegrade in the vertebral arteries.   Carotid Doppler  There is 1-39% bilateral ICA stenosis. Vertebral artery flow is antegrade.   LE venous doppler - negative for DVT  2D echo  - Left ventricle: The cavity size was normal. There was moderate concentric hypertrophy. Systolic function was vigorous. The estimated ejection fraction was in the range of 65% to 70%. Wall motion was normal; there were no regional wall motion abnormalities. There was an increased relative contribution of atrial contraction to ventricular filling. Doppler parameters are consistent with abnormal left ventricular relaxation (grade 1 diastolic dysfunction). Doppler parameters are consistent with high ventricular filling pressure. - Aortic valve: Moderate diffuse thickening and calcification, consistent with sclerosis. - Mitral valve: Calcified annulus. Mild diffuse thickening and calcification of the anterior leaflet and  posterior leaflet.  TEE - Left Ventrical: Normal LV  Mitral Valve: trace - mild MR, Calcified. No significant stenosis  Aortic Valve: calcified. No AS or AI Tricuspid Valve: mild TR  Pulmonic Valve: no PI Left Atrium/ Left atrial appendage: no thrombi Atrial septum: aneurismal, + PFO, Significant right to left flow with deep breath  Aorta: mild - moderate plaque  Loop recorder - afib found on 09/05/15  Component     Latest Ref Rng 06/03/2015  Cholesterol     0 - 200 mg/dL 198  Triglycerides     <150 mg/dL 373 (H)  HDL Cholesterol     >40 mg/dL 42  Total CHOL/HDL Ratio      4.7  VLDL     0 - 40 mg/dL 75 (H)  LDL (calc)     0 - 99 mg/dL 81  Hemoglobin A1C     4.8 - 5.6 % 7.7 (H)  Mean Plasma Glucose      174    Assessment: As you may recall, she is a 82 y.o. Caucasian female with PMH of hypertension, hyperlipidemia, chronic kidney disease, diabetes mellitus, and previous TIAs admitted on 06/02/15 for left homonymous hemianopia. MRI showed right occipital lobe acute infarct, embolic pattern. MRA, CUS, TTE and LE venous doppler all negative. TEE showed PFO and loop recorder was placed. LDL 81 and A1C 7.7. She was discharged on plavix and lipitor 20mg . During the interval time, vision improved to left lower quadrantanopia and was cleared for driving by ophthalmology. Loop showed afib, and was put on afib. However, only on 2.5mg   bid dosing.   Plan:  - continue eliquis for stroke prevention. Continue 2.5mg  twice a day now but will check Cre today to decide on correct dosing.  - continue lipitor for HLD and stroke prevention - Follow up with your primary care physician for stroke risk factor modification. Recommend maintain blood pressure goal <130/80, diabetes with hemoglobin A1c goal below 6.5% and lipids with LDL cholesterol goal below 70 mg/dL.  - follow up with ophthalmology as scheduled - recommended to drive during the day not at night, no long distance and drive in  familiar roads. - check BP and glucose at home and record - diabetic diet and regular exercise - follow up in 3 months.  I spent more than 25 minutes of face to face time with the patient. Greater than 50% of time was spent in counseling and coordination of care. We discussed eliquis doing management, follow up with eye doctor and driving precautions.   Orders Placed This Encounter  Procedures  . Basic metabolic panel  . CBC    No orders of the defined types were placed in this encounter.   Patient Instructions  - continue eliquis for stroke prevention. Continue 2.5mg  twice a day now but will check your kidney function today. If near normal, you will need full dose eliquis 5mg  twice a day.  - continue lipitor for HLD and stroke prevention - Follow up with your primary care physician for stroke risk factor modification. Recommend maintain blood pressure goal <130/80, diabetes with hemoglobin A1c goal below 6.5% and lipids with LDL cholesterol goal below 70 mg/dL.  - follow up with eye doctor as scheduled - recommended to drive during the day not at night, no long distance and drive in familiar roads. - check BP and glucose at home and record - diabetic diet and regular exercise - follow up in 3 months.    Rosalin Hawking, MD PhD [facility] [address], [place], [place] [address] [phone]

## 2015-11-13 NOTE — Patient Instructions (Addendum)
-   continue eliquis for stroke prevention. Continue 2.5mg  twice a day now but will check your kidney function today. If near normal, you will need full dose eliquis 5mg  twice a day.  - continue lipitor for HLD and stroke prevention - Follow up with your primary care physician for stroke risk factor modification. Recommend maintain blood pressure goal <130/80, diabetes with hemoglobin A1c goal below 6.5% and lipids with LDL cholesterol goal below 70 mg/dL.  - follow up with eye doctor as scheduled - recommended to drive during the day not at night, no long distance and drive in familiar roads. - check BP and glucose at home and record - diabetic diet and regular exercise - follow up in 3 months.

## 2015-11-14 DIAGNOSIS — Z7901 Long term (current) use of anticoagulants: Secondary | ICD-10-CM | POA: Insufficient documentation

## 2015-11-14 DIAGNOSIS — I480 Paroxysmal atrial fibrillation: Secondary | ICD-10-CM | POA: Insufficient documentation

## 2015-11-14 LAB — BASIC METABOLIC PANEL
BUN/Creatinine Ratio: 20 (ref 12–28)
BUN: 31 mg/dL — AB (ref 8–27)
CALCIUM: 11.3 mg/dL — AB (ref 8.7–10.3)
CO2: 30 mmol/L — AB (ref 18–29)
CREATININE: 1.52 mg/dL — AB (ref 0.57–1.00)
Chloride: 96 mmol/L (ref 96–106)
GFR calc Af Amer: 37 mL/min/{1.73_m2} — ABNORMAL LOW (ref >59)
GFR, EST NON AFRICAN AMERICAN: 32 mL/min/{1.73_m2} — AB (ref >59)
GLUCOSE: 103 mg/dL — AB (ref 65–99)
Potassium: 4.1 mmol/L (ref 3.5–5.2)
Sodium: 143 mmol/L (ref 134–144)

## 2015-11-14 LAB — CBC
HEMATOCRIT: 40.7 % (ref 34.0–46.6)
HEMOGLOBIN: 13.5 g/dL (ref 11.1–15.9)
MCH: 29.3 pg (ref 26.6–33.0)
MCHC: 33.2 g/dL (ref 31.5–35.7)
MCV: 88 fL (ref 79–97)
PLATELETS: 259 10*3/uL (ref 150–379)
RBC: 4.61 x10E6/uL (ref 3.77–5.28)
RDW: 14.1 % (ref 12.3–15.4)
WBC: 8.5 10*3/uL (ref 3.4–10.8)

## 2015-11-17 DIAGNOSIS — Z1231 Encounter for screening mammogram for malignant neoplasm of breast: Secondary | ICD-10-CM | POA: Diagnosis not present

## 2015-11-28 ENCOUNTER — Other Ambulatory Visit: Payer: Self-pay | Admitting: Licensed Clinical Social Worker

## 2015-11-28 NOTE — Patient Outreach (Signed)
Assessment:  CSW spoke via phone with client. CSW verified identity of client. CSW received verbal permission from client on 11/28/15 for CSW to speak with client about current client needs. Client said she is eating well and sleeping well. She said she has her prescribed medications and is taking medications as prescribed. Client sees [name] as primary care doctor.  Client said she has appointment with [name] on 12/29/15.  Son of client does help client with client transport needs to and from client's scheduled medical appointments.CSW and client spoke of client care plan. CSW encouraged client to make transport arrangements in next 30 days to transport  client to and from her scheduled medical appointments.  Client sees cardiologist as scheduled.  Client said she is trying to pay her monthly bills on time each month. Client said she is now able to drive herself to her appointments more often.  She did not mention any pain issues.  She said she does not use any device to help with ambulation.  She said she no longer receives in home services at present.  CSW spoke with client about [facility] program support in pharmacy, social work and nursing areas.  CSW encouraged client or her son Jeneen Rinks to call CSW at [phone] to further discuss social work needs of client.  Client was appreciative of phone call from [place] on 11/28/15.    Plan:  Client to make transport arrangements in next 30 days to transport client to and from her scheduled medical appointments.  CSW to call client in 4 weeks to assess client needs.  [name].Keily Lepp MSW, LCSW Licensed Clinical Social Worker [facility] Care Management [phone]

## 2015-12-02 LAB — CUP PACEART REMOTE DEVICE CHECK: MDC IDC SESS DTM: 20170909201510

## 2015-12-02 NOTE — Progress Notes (Signed)
Carelink summary report received. Battery status OK. Normal device function. No new symptom episodes, tachy episodes, brady, or pause episodes. 18% AF, V rates controlled, +Eliquis. Monthly summary reports and ROV/PRN

## 2015-12-04 ENCOUNTER — Ambulatory Visit (INDEPENDENT_AMBULATORY_CARE_PROVIDER_SITE_OTHER): Payer: Medicare Other | Admitting: *Deleted

## 2015-12-04 DIAGNOSIS — Z4509 Encounter for adjustment and management of other cardiac device: Secondary | ICD-10-CM | POA: Diagnosis not present

## 2015-12-05 NOTE — Progress Notes (Signed)
Carelink Summary Report / Loop Recorder 

## 2015-12-22 DIAGNOSIS — E785 Hyperlipidemia, unspecified: Secondary | ICD-10-CM | POA: Diagnosis not present

## 2015-12-22 DIAGNOSIS — I6789 Other cerebrovascular disease: Secondary | ICD-10-CM | POA: Diagnosis not present

## 2015-12-22 DIAGNOSIS — M109 Gout, unspecified: Secondary | ICD-10-CM | POA: Diagnosis not present

## 2015-12-22 DIAGNOSIS — I10 Essential (primary) hypertension: Secondary | ICD-10-CM | POA: Diagnosis not present

## 2015-12-22 DIAGNOSIS — Z79899 Other long term (current) drug therapy: Secondary | ICD-10-CM | POA: Diagnosis not present

## 2016-01-01 ENCOUNTER — Other Ambulatory Visit: Payer: Self-pay | Admitting: Licensed Clinical Social Worker

## 2016-01-01 ENCOUNTER — Encounter: Payer: Self-pay | Admitting: Licensed Clinical Social Worker

## 2016-01-01 NOTE — Patient Outreach (Signed)
Assessment:  CSW spoke via phone with client. CSW verified client identity. CSW received verbal permission from client on 01/01/16 for CSW to speak with client about current client needs. Client said she had her prescribed medications and was taking medications as prescribed. She said she was eating adequately and sleeping adequately.  She Sees Amy Wiggins as primary care doctor. She is attending medical appointments with Amy Wiggins as scheduled.  Client said she has support from her son Amy Wiggins.  Client said she has not had to call [facility] Disability [facility]ient [facility] recently for transport help. She said her son, Amy Wiggins,  often drives her to and from her scheduled medical appointments. She said also that she sometimes drives herself to and from her scheduled medical appointments. CSW informed client that client has now met care plan goals for client with CSW services.Client agreed that she had met her care plan goals. She said she had no transport needs at present. Thus, CSW informed client that since client had met her care plan goals with CSW services, that Amy Wiggins CSW would discharge client on 01/01/16 from Amy Wiggins services. Client agreed to this plan.  CSW congratulated client on meeting her care plan goal with CSW services. Client was appreciative of support of [facility] CSW in recent months    Plan:  CSW is discharging Amy Wiggins from Amy Wiggins services on 01/01/16 since client has met her care plan goals with CSW services.   CSW to inform Amy Wiggins that [facility] discharged client on 01/01/16 from Amy Wiggins services.  CSW to fax physician case closure letter to Amy Wiggins infoming Amy Wiggins that Amy Wiggins discharged client from Amy Wiggins CSW services on 01/01/16.   [name].Isaia Hassell MSW, LCSW Licensed Clinical Social Worker [facility] Care Management [phone]

## 2016-01-03 ENCOUNTER — Ambulatory Visit (INDEPENDENT_AMBULATORY_CARE_PROVIDER_SITE_OTHER): Payer: Medicare Other | Admitting: *Deleted

## 2016-01-03 DIAGNOSIS — Z4509 Encounter for adjustment and management of other cardiac device: Secondary | ICD-10-CM | POA: Diagnosis not present

## 2016-01-04 NOTE — Progress Notes (Signed)
Carelink Summary Report / Loop Recorder 

## 2016-01-05 DIAGNOSIS — E1122 Type 2 diabetes mellitus with diabetic chronic kidney disease: Secondary | ICD-10-CM | POA: Diagnosis not present

## 2016-01-05 DIAGNOSIS — Z23 Encounter for immunization: Secondary | ICD-10-CM | POA: Diagnosis not present

## 2016-01-05 DIAGNOSIS — N184 Chronic kidney disease, stage 4 (severe): Secondary | ICD-10-CM | POA: Diagnosis not present

## 2016-01-05 DIAGNOSIS — I480 Paroxysmal atrial fibrillation: Secondary | ICD-10-CM | POA: Diagnosis not present

## 2016-01-05 DIAGNOSIS — Z8673 Personal history of transient ischemic attack (TIA), and cerebral infarction without residual deficits: Secondary | ICD-10-CM | POA: Diagnosis not present

## 2016-01-05 DIAGNOSIS — E1129 Type 2 diabetes mellitus with other diabetic kidney complication: Secondary | ICD-10-CM | POA: Diagnosis not present

## 2016-01-07 LAB — CUP PACEART REMOTE DEVICE CHECK
Date Time Interrogation Session: 20171009210622
Implantable Pulse Generator Implant Date: 20170412

## 2016-01-07 NOTE — Progress Notes (Signed)
Carelink summary report received. Battery status OK. Normal device function. No new symptom episodes, tachy episodes, brady, or pause episodes. 4.4% AF, +Eliquis.  Monthly summary reports and ROV/PRN

## 2016-01-14 ENCOUNTER — Emergency Department (HOSPITAL_COMMUNITY): Payer: Medicare Other

## 2016-01-14 ENCOUNTER — Observation Stay (HOSPITAL_COMMUNITY)
Admission: EM | Admit: 2016-01-14 | Discharge: 2016-01-16 | Disposition: A | Discharge status: Home / Self Care | Payer: Medicare Other | Attending: Internal Medicine | Admitting: Internal Medicine

## 2016-01-14 ENCOUNTER — Encounter (HOSPITAL_COMMUNITY): Payer: Self-pay

## 2016-01-14 DIAGNOSIS — I4891 Unspecified atrial fibrillation: Secondary | ICD-10-CM | POA: Diagnosis not present

## 2016-01-14 DIAGNOSIS — J189 Pneumonia, unspecified organism: Secondary | ICD-10-CM | POA: Diagnosis not present

## 2016-01-14 DIAGNOSIS — E872 Acidosis: Secondary | ICD-10-CM | POA: Diagnosis not present

## 2016-01-14 DIAGNOSIS — J45909 Unspecified asthma, uncomplicated: Secondary | ICD-10-CM | POA: Diagnosis not present

## 2016-01-14 DIAGNOSIS — Z823 Family history of stroke: Secondary | ICD-10-CM | POA: Diagnosis not present

## 2016-01-14 DIAGNOSIS — R062 Wheezing: Secondary | ICD-10-CM | POA: Diagnosis not present

## 2016-01-14 DIAGNOSIS — R000 Tachycardia, unspecified: Secondary | ICD-10-CM | POA: Diagnosis not present

## 2016-01-14 DIAGNOSIS — I480 Paroxysmal atrial fibrillation: Secondary | ICD-10-CM | POA: Diagnosis not present

## 2016-01-14 DIAGNOSIS — F329 Major depressive disorder, single episode, unspecified: Secondary | ICD-10-CM | POA: Insufficient documentation

## 2016-01-14 DIAGNOSIS — R0602 Shortness of breath: Secondary | ICD-10-CM

## 2016-01-14 DIAGNOSIS — E1122 Type 2 diabetes mellitus with diabetic chronic kidney disease: Secondary | ICD-10-CM | POA: Insufficient documentation

## 2016-01-14 DIAGNOSIS — N184 Chronic kidney disease, stage 4 (severe): Secondary | ICD-10-CM | POA: Diagnosis not present

## 2016-01-14 DIAGNOSIS — Z79899 Other long term (current) drug therapy: Secondary | ICD-10-CM | POA: Insufficient documentation

## 2016-01-14 DIAGNOSIS — I129 Hypertensive chronic kidney disease with stage 1 through stage 4 chronic kidney disease, or unspecified chronic kidney disease: Secondary | ICD-10-CM | POA: Insufficient documentation

## 2016-01-14 DIAGNOSIS — E1159 Type 2 diabetes mellitus with other circulatory complications: Secondary | ICD-10-CM | POA: Diagnosis not present

## 2016-01-14 DIAGNOSIS — D72829 Elevated white blood cell count, unspecified: Secondary | ICD-10-CM | POA: Diagnosis not present

## 2016-01-14 DIAGNOSIS — Z8673 Personal history of transient ischemic attack (TIA), and cerebral infarction without residual deficits: Secondary | ICD-10-CM | POA: Insufficient documentation

## 2016-01-14 DIAGNOSIS — Z7984 Long term (current) use of oral hypoglycemic drugs: Secondary | ICD-10-CM | POA: Insufficient documentation

## 2016-01-14 DIAGNOSIS — Z7901 Long term (current) use of anticoagulants: Secondary | ICD-10-CM | POA: Insufficient documentation

## 2016-01-14 DIAGNOSIS — E119 Type 2 diabetes mellitus without complications: Secondary | ICD-10-CM

## 2016-01-14 DIAGNOSIS — R05 Cough: Secondary | ICD-10-CM | POA: Diagnosis not present

## 2016-01-14 LAB — CBC
HCT: 43.4 % (ref 36.0–46.0)
HEMOGLOBIN: 13.9 g/dL (ref 12.0–15.0)
MCH: 29.3 pg (ref 26.0–34.0)
MCHC: 32 g/dL (ref 30.0–36.0)
MCV: 91.4 fL (ref 78.0–100.0)
PLATELETS: 249 10*3/uL (ref 150–400)
RBC: 4.75 MIL/uL (ref 3.87–5.11)
RDW: 13.6 % (ref 11.5–15.5)
WBC: 10.7 10*3/uL — ABNORMAL HIGH (ref 4.0–10.5)

## 2016-01-14 LAB — BASIC METABOLIC PANEL
Anion gap: 12 (ref 5–15)
BUN: 30 mg/dL — AB (ref 6–20)
CALCIUM: 10.2 mg/dL (ref 8.9–10.3)
CO2: 27 mmol/L (ref 22–32)
CREATININE: 1.5 mg/dL — AB (ref 0.44–1.00)
Chloride: 96 mmol/L — ABNORMAL LOW (ref 101–111)
GFR calc Af Amer: 36 mL/min — ABNORMAL LOW (ref >60)
GFR, EST NON AFRICAN AMERICAN: 31 mL/min — AB (ref >60)
GLUCOSE: 146 mg/dL — AB (ref 65–99)
Potassium: 3.7 mmol/L (ref 3.5–5.1)
Sodium: 135 mmol/L (ref 135–145)

## 2016-01-14 LAB — BRAIN NATRIURETIC PEPTIDE: B Natriuretic Peptide: 119.8 pg/mL — ABNORMAL HIGH (ref 0.0–100.0)

## 2016-01-14 LAB — I-STAT TROPONIN, ED: TROPONIN I, POC: 0.05 ng/mL (ref 0.00–0.08)

## 2016-01-14 LAB — I-STAT CG4 LACTIC ACID, ED
LACTIC ACID, VENOUS: 1.98 mmol/L — AB (ref 0.5–1.9)
Lactic Acid, Venous: 1.28 mmol/L (ref 0.5–1.9)

## 2016-01-14 LAB — GLUCOSE, CAPILLARY: Glucose-Capillary: 185 mg/dL — ABNORMAL HIGH (ref 65–99)

## 2016-01-14 LAB — STREP PNEUMONIAE URINARY ANTIGEN: Strep Pneumo Urinary Antigen: NEGATIVE

## 2016-01-14 MED ORDER — SODIUM CHLORIDE 0.9 % IV BOLUS (SEPSIS): 1000.0000 mL | Freq: Once | INTRAVENOUS | Status: DC

## 2016-01-14 MED ORDER — METHYLPREDNISOLONE SODIUM SUCC 40 MG IJ SOLR
40.0000 mg | Freq: Two times a day (BID) | INTRAMUSCULAR | Status: DC
  Administered 2016-01-14: 40 mg via INTRAVENOUS
  Filled 2016-01-14 (×2): qty 1

## 2016-01-14 MED ORDER — DEXTROSE 5 % IV SOLN
1.0000 g | Freq: Once | INTRAVENOUS | Status: AC
  Administered 2016-01-14: 1 g via INTRAVENOUS
  Filled 2016-01-14: qty 10

## 2016-01-14 MED ORDER — VENLAFAXINE HCL ER 75 MG PO CP24
75.0000 mg | ORAL_CAPSULE | Freq: Every day | ORAL | Status: DC
  Administered 2016-01-15 – 2016-01-16 (×2): 75 mg via ORAL
  Filled 2016-01-14 (×2): qty 1

## 2016-01-14 MED ORDER — SODIUM CHLORIDE 0.9 % IV BOLUS (SEPSIS)
500.0000 mL | Freq: Once | INTRAVENOUS | Status: AC
  Administered 2016-01-14: 500 mL via INTRAVENOUS

## 2016-01-14 MED ORDER — ALBUTEROL SULFATE (2.5 MG/3ML) 0.083% IN NEBU
2.5000 mg | INHALATION_SOLUTION | RESPIRATORY_TRACT | Status: DC | PRN
  Administered 2016-01-15 – 2016-01-16 (×2): 2.5 mg via RESPIRATORY_TRACT
  Filled 2016-01-14 (×2): qty 3

## 2016-01-14 MED ORDER — DEXTROSE 5 % IV SOLN
500.0000 mg | Freq: Once | INTRAVENOUS | Status: AC
  Administered 2016-01-14: 500 mg via INTRAVENOUS
  Filled 2016-01-14: qty 500

## 2016-01-14 MED ORDER — IPRATROPIUM-ALBUTEROL 0.5-2.5 (3) MG/3ML IN SOLN
3.0000 mL | Freq: Once | RESPIRATORY_TRACT | Status: AC
  Administered 2016-01-14: 3 mL via RESPIRATORY_TRACT
  Filled 2016-01-14: qty 3

## 2016-01-14 MED ORDER — DEXAMETHASONE SODIUM PHOSPHATE 4 MG/ML IJ SOLN
2.0000 mg | Freq: Once | INTRAMUSCULAR | Status: AC
  Administered 2016-01-14: 2 mg via INTRAVENOUS
  Filled 2016-01-14: qty 1

## 2016-01-14 MED ORDER — SODIUM CHLORIDE 0.9 % IV SOLN
INTRAVENOUS | Status: DC
  Administered 2016-01-14 – 2016-01-16 (×3): via INTRAVENOUS

## 2016-01-14 MED ORDER — INSULIN ASPART 100 UNIT/ML ~~LOC~~ SOLN: 0.0000 [IU] | Freq: Every day | SUBCUTANEOUS | Status: DC

## 2016-01-14 MED ORDER — LOSARTAN POTASSIUM 50 MG PO TABS
100.0000 mg | ORAL_TABLET | Freq: Every day | ORAL | Status: DC
  Administered 2016-01-14 – 2016-01-16 (×3): 100 mg via ORAL
  Filled 2016-01-14 (×3): qty 2

## 2016-01-14 MED ORDER — ADULT MULTIVITAMIN W/MINERALS CH
1.0000 | ORAL_TABLET | Freq: Every day | ORAL | Status: DC
  Administered 2016-01-14 – 2016-01-15 (×2): 1 via ORAL
  Filled 2016-01-14 (×2): qty 1

## 2016-01-14 MED ORDER — DEXTROSE 5 % IV SOLN
1.0000 g | INTRAVENOUS | Status: DC
  Filled 2016-01-14: qty 10

## 2016-01-14 MED ORDER — DEXTROSE 5 % IV SOLN
500.0000 mg | INTRAVENOUS | Status: DC
  Filled 2016-01-14: qty 500

## 2016-01-14 MED ORDER — MAGNESIUM OXIDE 400 (241.3 MG) MG PO TABS
400.0000 mg | ORAL_TABLET | Freq: Every day | ORAL | Status: DC
  Administered 2016-01-15 – 2016-01-16 (×2): 400 mg via ORAL
  Filled 2016-01-14 (×2): qty 1

## 2016-01-14 MED ORDER — ATORVASTATIN CALCIUM 20 MG PO TABS
20.0000 mg | ORAL_TABLET | Freq: Every day | ORAL | Status: DC
  Administered 2016-01-14 – 2016-01-16 (×3): 20 mg via ORAL
  Filled 2016-01-14 (×3): qty 1

## 2016-01-14 MED ORDER — ACETAMINOPHEN 325 MG PO TABS
650.0000 mg | ORAL_TABLET | Freq: Four times a day (QID) | ORAL | Status: DC | PRN
  Administered 2016-01-14: 650 mg via ORAL
  Filled 2016-01-14: qty 2

## 2016-01-14 MED ORDER — PIOGLITAZONE HCL 15 MG PO TABS
15.0000 mg | ORAL_TABLET | Freq: Every day | ORAL | Status: DC
Start: 2016-01-15 — End: 2016-01-16
  Administered 2016-01-15 – 2016-01-16 (×2): 15 mg via ORAL
  Filled 2016-01-14 (×2): qty 1

## 2016-01-14 MED ORDER — INSULIN ASPART 100 UNIT/ML ~~LOC~~ SOLN
0.0000 [IU] | Freq: Three times a day (TID) | SUBCUTANEOUS | Status: DC
  Administered 2016-01-15: 8 [IU] via SUBCUTANEOUS
  Administered 2016-01-15 (×2): 3 [IU] via SUBCUTANEOUS

## 2016-01-14 MED ORDER — GLIMEPIRIDE 4 MG PO TABS
2.0000 mg | ORAL_TABLET | Freq: Every day | ORAL | Status: DC
  Administered 2016-01-15 – 2016-01-16 (×2): 2 mg via ORAL
  Filled 2016-01-14 (×2): qty 1

## 2016-01-14 MED ORDER — LIRAGLUTIDE 18 MG/3ML ~~LOC~~ SOPN: 1.8000 mg | PEN_INJECTOR | Freq: Every day | SUBCUTANEOUS | Status: DC

## 2016-01-14 MED ORDER — APIXABAN 2.5 MG PO TABS
2.5000 mg | ORAL_TABLET | Freq: Two times a day (BID) | ORAL | Status: DC
  Administered 2016-01-14 – 2016-01-16 (×4): 2.5 mg via ORAL
  Filled 2016-01-14 (×4): qty 1

## 2016-01-14 MED ORDER — TRAZODONE HCL 100 MG PO TABS
100.0000 mg | ORAL_TABLET | Freq: Every day | ORAL | Status: DC
  Administered 2016-01-14 – 2016-01-15 (×2): 100 mg via ORAL
  Filled 2016-01-14 (×2): qty 1

## 2016-01-14 MED ORDER — PANTOPRAZOLE SODIUM 40 MG PO TBEC
40.0000 mg | DELAYED_RELEASE_TABLET | Freq: Every day | ORAL | Status: DC
  Administered 2016-01-14 – 2016-01-16 (×3): 40 mg via ORAL
  Filled 2016-01-14 (×4): qty 1

## 2016-01-14 NOTE — ED Triage Notes (Signed)
Patient sent from urgent care for evaluation of elevated HR, shortness of breath and possible pneumonia with cough and congestion x 4 days. Alert and oriented

## 2016-01-14 NOTE — ED Notes (Signed)
Dr. Lucianne Lei at bedside

## 2016-01-14 NOTE — ED Notes (Signed)
Patient given a [place] sandwich and cup of ice with a coke to drink ok'd by Res MD; visitors at bedside

## 2016-01-14 NOTE — ED Provider Notes (Signed)
[facility] DEPT Provider Note   CSN: [id number] Arrival date & time: 01/14/16  1346   History   Chief Complaint Chief Complaint  Patient presents with  . Chest Pain  . Shortness of Breath    HPI   Amy Wiggins is a 82 y.o. female with PMH of DM, HTN, CKD stage 4, HLD, stroke, paroxysmal A. Fib on chronic anticoagulation, and depression who presents with runny nose and productive cough x 5 days and shortness of breath x 1 day. Cough is intermittent and productive of yellow sputum. Patient states that her granddaughter lives with her and she also just got over similar symptoms. Last night patient was watching TV and noticed that she was having some trouble breathing. She notes that she was breathing faster than normal and had some wheezing. She tried to lay down and go to sleep but laying flat on her back actually made it worse. She admits to bilateral lower chest pain that is associated with coughing. Denies any fevers, chills, nausea, vomiting, diarrhea, constipation.   She has taken all her medications today with the exception of losartan.    Past Medical History:  Diagnosis Date  . Depression   . Diabetes mellitus   . Hypertension   . Stroke ([place])    TIA's x 2    Patient Active Problem List   Diagnosis Date Noted  . Paroxysmal atrial fibrillation ([place]) 11/14/2015  . Chronic anticoagulation 11/14/2015  . Cerebrovascular accident (CVA) due to embolism of right posterior cerebral artery ([place]) 08/08/2015  . Essential hypertension 08/08/2015  . HLD (hyperlipidemia) 08/08/2015  . Type 2 diabetes mellitus with circulatory disorder ([place]) 08/08/2015  . PFO (patent foramen ovale)   . ARF (acute renal failure) ([place]) 06/04/2015  . Cerebral infarction due to unspecified mechanism   . Acute CVA (cerebrovascular accident) ([place]) 06/02/2015  . Hypertension 06/02/2015  . Hyperlipidemia 06/02/2015  . CKD (chronic kidney disease) stage 4, GFR 15-29 ml/min (HCC) 06/02/2015  . DM  type 2 (diabetes mellitus, type 2) ([place]) 06/02/2015  . Leukocytosis 06/02/2015  . Muscle weakness (generalized) 09/16/2012  . Pain in joint, shoulder region 09/16/2012  . Closed displaced fracture of lateral end of clavicle 08/18/2012    Past Surgical History:  Procedure Laterality Date  . COLONOSCOPY    . COLONOSCOPY N/A 09/09/2014   Procedure: COLONOSCOPY;  Surgeon: Rogene Houston, MD;  [facility];  Service: Endoscopy;  Laterality: N/A;  1010 - moved to 7:30 - Ann to notify  . EP IMPLANTABLE DEVICE N/A 06/07/2015   Procedure: Loop Recorder Insertion;  Surgeon: Deboraha Sprang, MD;  Location: [facility];  Service: Cardiovascular;  Laterality: N/A;  . TEE WITHOUT CARDIOVERSION N/A 06/07/2015   Procedure: TRANSESOPHAGEAL ECHOCARDIOGRAM (TEE);  Surgeon: Thayer Headings, MD;  Location: [facility];  Service: Cardiovascular;  Laterality: N/A;    OB History    No data available       Home Medications    Prior to Admission medications   Medication Sig Start Date End Date Taking? Authorizing Provider  acetaminophen (TYLENOL) 500 MG tablet Take 1,000 mg by mouth at bedtime.    Historical Provider, MD  apixaban (ELIQUIS) 2.5 MG TABS tablet Take 1 tablet (2.5 mg total) by mouth 2 (two) times daily. 10/20/15   Sherran Needs, NP  atorvastatin (LIPITOR) 20 MG tablet Take 20 mg by mouth daily.    Historical Provider, MD  chlorthalidone (HYGROTON) 25 MG tablet Take 25 mg by mouth  every morning. For high blood pressure/fluid 01/03/13   Historical Provider, MD  glimepiride (AMARYL) 2 MG tablet Take 2 mg by mouth daily with breakfast. For Diabetes    Historical Provider, MD  Liraglutide (VICTOZA) 18 MG/3ML SOPN Inject 1.8 mg into the skin at bedtime.     Historical Provider, MD  losartan (COZAAR) 100 MG tablet Take 100 mg by mouth daily. For high blood pressure    Historical Provider, MD  magnesium oxide (MAG-OX) 400 MG tablet Take 400 mg by mouth daily.    Historical Provider, MD   Multiple Vitamin (MULTIVITAMIN WITH MINERALS) TABS tablet Take 1 tablet by mouth at bedtime. For supplement    Historical Provider, MD  pantoprazole (PROTONIX) 40 MG tablet Take 40 mg by mouth daily. For acid reflux/gerd    Historical Provider, MD  pioglitazone (ACTOS) 15 MG tablet Take 15 mg by mouth daily.    Historical Provider, MD  traZODone (DESYREL) 100 MG tablet Take 100 mg by mouth at bedtime.    Historical Provider, MD  venlafaxine XR (EFFEXOR-XR) 75 MG 24 hr capsule Take 75 mg by mouth daily with breakfast.    Historical Provider, MD    Family History Family History  Problem Relation Age of Onset  . Stroke Father   . Dementia Sister     Social History Social History  Substance Use Topics  . Smoking status: Never Smoker  . Smokeless tobacco: Never Used  . Alcohol use No     Allergies   Patient has no known allergies.   Review of Systems Review of Systems  10 Systems reviewed and are negative for acute change except as noted in the HPI.   Physical Exam Updated Vital Signs BP (!) 124/51   Pulse 118   Temp 98.4 F (36.9 C) (Oral)   Resp 17   Ht 5\' 1"  (1.549 m)   Wt 93.9 kg   SpO2 94%   BMI 39.11 kg/m   Physical Exam  Constitutional: She is oriented to person, place, and time. She appears well-developed and well-nourished.  HENT:  Head: Normocephalic and atraumatic.  Right Ear: External ear normal. Tympanic membrane is not bulging.  Left Ear: External ear normal. Tympanic membrane is not bulging.  Nose: Nose normal.  Mouth/Throat: Oropharynx is clear and moist.  Eyes: Conjunctivae and EOM are normal. Pupils are equal, round, and reactive to light.  Neck: Normal range of motion. Neck supple.  Cardiovascular: Regular rhythm, normal heart sounds and intact distal pulses.  Tachycardia present.   Pulmonary/Chest: Effort normal. No tachypnea. No respiratory distress. She has wheezes (end expiratory wheezing in bilateral lung fields throughout).  Abdominal:  Soft. Bowel sounds are normal. There is no tenderness.  Musculoskeletal: Normal range of motion. She exhibits edema (trace edema in bilateral ankles).  Neurological: She is alert and oriented to person, place, and time. No sensory deficit. She exhibits normal muscle tone.  Skin: Skin is warm and dry. Capillary refill takes less than 2 seconds. No rash noted.  Psychiatric: She has a normal mood and affect. Her behavior is normal. Judgment and thought content normal.     ED Treatments / Results  Labs (all labs ordered are listed, but only abnormal results are displayed) Labs Reviewed  BASIC METABOLIC PANEL - Abnormal; Notable for the following:       Result Value   Chloride 96 (*)    Glucose, Bld 146 (*)    BUN 30 (*)    Creatinine, Ser 1.50 (*)  GFR calc non Af Amer 31 (*)    GFR calc Af Amer 36 (*)    All other components within normal limits  CBC - Abnormal; Notable for the following:    WBC 10.7 (*)    All other components within normal limits  BRAIN NATRIURETIC PEPTIDE - Abnormal; Notable for the following:    B Natriuretic Peptide 119.8 (*)    All other components within normal limits  CULTURE, BLOOD (ROUTINE X 2)  CULTURE, BLOOD (ROUTINE X 2)  I-STAT TROPOININ, ED  I-STAT CG4 LACTIC ACID, ED  I-STAT CG4 LACTIC ACID, ED    EKG  EKG Interpretation  Date/Time:  Sunday January 14 2016 14:06:50 EST Ventricular Rate:  125 PR Interval:  174 QRS Duration: 88 QT Interval:  298 QTC Calculation: 430 R Axis:   -58 Text Interpretation:  Sinus tachycardia with occasional Premature ventricular complexes Left anterior fascicular block Abnormal ECG Confirmed by ZAVITZ MD, JOSHUA [phone]) on 01/14/2016 3:13:11 PM       Radiology Dg Chest 2 View  Result Date: 01/14/2016 CLINICAL DATA:  82 year old female with a history of cough congestion for 4 days EXAM: CHEST  2 VIEW COMPARISON:  06/02/2015 FINDINGS: Cardiomediastinal silhouette unchanged. Interval placement of loop  recorder insertion, projecting at the lower left chest. Calcifications of the aortic arch. No confluent airspace disease, pneumothorax, or pleural effusion. IMPRESSION: No radiographic evidence of acute cardiopulmonary disease. Interval placement of loop recorder on the left chest wall. Aortic atherosclerosis. Signed, Dulcy Fanny. Earleen Newport, DO [facility] Radiology Electronically Signed   By: Corrie Mckusick D.O.   On: 01/14/2016 14:38    Procedures Procedures (including critical care time)  Medications Ordered in ED Medications  sodium chloride 0.9 % bolus 500 mL (not administered)  dexamethasone (DECADRON) injection 2 mg (not administered)  cefTRIAXone (ROCEPHIN) 1 g in dextrose 5 % 50 mL IVPB (not administered)  azithromycin (ZITHROMAX) 500 mg in dextrose 5 % 250 mL IVPB (not administered)  ipratropium-albuterol (DUONEB) 0.5-2.5 (3) MG/3ML nebulizer solution 3 mL (3 mLs Nebulization Given 01/14/16 1603)  sodium chloride 0.9 % bolus 500 mL (0 mLs Intravenous Stopped 01/14/16 1751)     Initial Impression / Assessment and Plan / ED Course  I have reviewed the triage vital signs and the nursing notes.  Pertinent labs & imaging results that were available during my care of the patient were reviewed by me and considered in my medical decision making (see chart for details).  Clinical Course as of Jan 14 1808  Nancy Fetter Jan 14, 2016  1622 EKG 12-Lead [CG]  1622 EKG 12-Lead [CG]    Clinical Course User Index [CG] Carlyle Dolly, MD   JAKKI DOUGHTY is a 82 y.o. female with PMH of DM, HTN, HLD, stroke, paroxysmal A. Fib on chronic anticoagulation, and depression who presents with runny nose and productive cough x 5 days and shortness of breath x 1 day.   Initial vital signs notable for tachycardia to 125 but otherwise patient had normal respiratory rate without hypoxia. Physical exam showing mildly dry mucous membranes and bilateral expiratory  wheezing heard without using a stethoscope.   EKG showing sinus tachycardia. CXR without evidence of pneumonia or edema. CBC showing mild leukocytosis of 10.8. LA normal at 1.28. Troponin neg at 0.05. BMP Cr 1.50 (seems to be at baseline). BNP at 119.8.   Duoneb x 1 given as well as 500cc NS bolus (gentle with fluids due to CKD IV). Patient's wheezing  and heart rate remained the same. 2nd 500cc bolus given. Wheezing was still audible from 3 feet away. Decadron 2 mg given.   Patient continued to wheeze with O2 dipping into high 80's intermittently when talking.   Blood cultures drawn, Rocephin and Azithromycin given for possible pneumonia.   Discussed patient with [facility] hospitalist Dr. Eliseo Squires for admission, appreciate her assistance.   Final Clinical Impressions(s) / ED Diagnoses   Final diagnoses:  Shortness of breath    New Prescriptions New Prescriptions   No medications on file     Carlyle Dolly, MD 01/14/16 1825    Elnora Morrison, MD 01/15/16 [phone]

## 2016-01-14 NOTE — ED Notes (Signed)
Resident at bedside.  

## 2016-01-14 NOTE — H&P (Signed)
History and Physical    Amy Wiggins RSW:[id number] DOB: 1932/02/01 DOA: 01/14/2016  Referring MD/NP/PA: er PCP: Asencion Noble, MD Outpatient Specialists:  Patient coming from: home  Chief Complaint: shortness of breath  HPI: Amy Wiggins is a 82 y.o. female with medical history significant of stroke, diabetes, hypertension. Patient has not been feeling well for several days. She was seen in urgent care today and was found to have an elevated heart rate, shortness of breath, and was wheezing. They suspected pneumonia as patient had cough and congestion for 4 days to center to the ER for an evaluation. Patient states she's had a runny nose and productive cough. Patient's granddaughter had a similar episode right before patient became sick as well. Patient hasn't been unable to sleep flat as well, requiring 3 pillows. She felt like she was smothering when she laid flat. No lower extremity edema. Denies fevers and chills, denies diarrhea.  ED Course: In the ER patient had chest x-ray that did not show any evidence of infection or edema. Patient did have a slight leukocytosis at 10.8. Creatinine was at baseline. Patient was given a DuoNeb 1 with no improvement in her wheezing. Patient was also given Decadron 2 mg. In the ER patient was found to have acute respiratory failure with O2 sats dipping into the upper 80s while talking.  Review of Systems: all systems reviewed, negative unless stated above in HPI   Past Medical History:  Diagnosis Date  . Depression   . Diabetes mellitus   . Hypertension   . Stroke ([place])    TIA's x 2    Past Surgical History:  Procedure Laterality Date  . COLONOSCOPY    . COLONOSCOPY N/A 09/09/2014   Procedure: COLONOSCOPY;  Surgeon: Rogene Houston, MD;  [facility];  Service: Endoscopy;  Laterality: N/A;  1010 - moved to 7:30 - Ann to notify  . EP IMPLANTABLE DEVICE N/A 06/07/2015   Procedure: Loop Recorder Insertion;  Surgeon: Deboraha Sprang, MD;  Location: [facility] CV LAB;  Service: Cardiovascular;  Laterality: N/A;  . TEE WITHOUT CARDIOVERSION N/A 06/07/2015   Procedure: TRANSESOPHAGEAL ECHOCARDIOGRAM (TEE);  Surgeon: Thayer Headings, MD;  Location: [facility];  Service: Cardiovascular;  Laterality: N/A;     reports that she has never smoked. She has never used smokeless tobacco. She reports that she does not drink alcohol or use drugs.  No Known Allergies  Family History  Problem Relation Age of Onset  . Stroke Father   . Dementia Sister      Prior to Admission medications   Medication Sig Start Date End Date Taking? Authorizing Provider  acetaminophen (TYLENOL) 500 MG tablet Take 1,000 mg by mouth at bedtime.    Historical Provider, MD  apixaban (ELIQUIS) 2.5 MG TABS tablet Take 1 tablet (2.5 mg total) by mouth 2 (two) times daily. 10/20/15   Sherran Needs, NP  atorvastatin (LIPITOR) 20 MG tablet Take 20 mg by mouth daily.    Historical Provider, MD  chlorthalidone (HYGROTON) 25 MG tablet Take 25 mg by mouth every morning. For high blood pressure/fluid 01/03/13   Historical Provider, MD  glimepiride (AMARYL) 2 MG tablet Take 2 mg by mouth daily with breakfast. For Diabetes    Historical Provider, MD  Liraglutide (VICTOZA) 18 MG/3ML SOPN Inject 1.8 mg into the skin at bedtime.     Historical Provider, MD  losartan (COZAAR) 100 MG tablet Take 100 mg by mouth daily. For high blood pressure  Historical Provider, MD  magnesium oxide (MAG-OX) 400 MG tablet Take 400 mg by mouth daily.    Historical Provider, MD  Multiple Vitamin (MULTIVITAMIN WITH MINERALS) TABS tablet Take 1 tablet by mouth at bedtime. For supplement    Historical Provider, MD  pantoprazole (PROTONIX) 40 MG tablet Take 40 mg by mouth daily. For acid reflux/gerd    Historical Provider, MD  pioglitazone (ACTOS) 15 MG tablet Take 15 mg by mouth daily.    Historical Provider, MD  traZODone (DESYREL) 100 MG tablet Take 100 mg by mouth at bedtime.     Historical Provider, MD  venlafaxine XR (EFFEXOR-XR) 75 MG 24 hr capsule Take 75 mg by mouth daily with breakfast.    Historical Provider, MD    Physical Exam:     Constitutional: NAD, calm, comfortable Vitals:   01/14/16 1745 01/14/16 1800 01/14/16 1815 01/14/16 1830  BP: 102/67 135/73 131/64 148/72  Pulse: (!) 127 120 117 116  Resp: 22 21 19 20   Temp:      TempSrc:      SpO2: 91% 100% 95% 96%  Weight:      Height:       Eyes: PERRL, lids and conjunctivae normal ENMT: Mucous membranes are moist. Posterior pharynx clear of any exudate or lesions.Normal dentition.  Neck: normal, supple, no masses, no thyromegaly Respiratory: diminished, diffuse wheezing Cardiovascular: fast rate, no murmurs / rubs / gallops. min extremity edema. 2+ pedal pulses. No carotid bruits.  Abdomen: no tenderness, no masses palpated. No hepatosplenomegaly. Bowel sounds positive.  Musculoskeletal: no clubbing / cyanosis. No joint deformity upper and lower extremities. Good ROM, no contractures. Normal muscle tone.  Skin: no rashes, lesions, ulcers. No induration Neurologic: CN 2-12 grossly intact. Sensation intact, DTR normal. Strength 5/5 in all 4.  Psychiatric: Normal judgment and insight. Alert and oriented x 3. Normal mood.     Labs on Admission: I have personally reviewed following labs and imaging studies  CBC:  Recent Labs Lab 01/14/16 1406  WBC 10.7*  HGB 13.9  HCT 43.4  MCV 91.4  PLT 355   Basic Metabolic Panel:  Recent Labs Lab 01/14/16 1406  NA 135  K 3.7  CL 96*  CO2 27  GLUCOSE 146*  BUN 30*  CREATININE 1.50*  CALCIUM 10.2   GFR: Estimated Creatinine Clearance (by C-G formula based on SCr of 1.5 mg/dL (H)) Female: 30.7 mL/min Female: 37.6 mL/min Liver Function Tests: No results for input(s): AST, ALT, ALKPHOS, BILITOT, PROT, ALBUMIN in the last 168 hours. No results for input(s): LIPASE, AMYLASE in the last 168 hours. No results for input(s): AMMONIA in the last 168  hours. Coagulation Profile: No results for input(s): INR, PROTIME in the last 168 hours. Cardiac Enzymes: No results for input(s): CKTOTAL, CKMB, CKMBINDEX, TROPONINI in the last 168 hours. BNP (last 3 results) No results for input(s): PROBNP in the last 8760 hours. HbA1C: No results for input(s): HGBA1C in the last 72 hours. CBG: No results for input(s): GLUCAP in the last 168 hours. Lipid Profile: No results for input(s): CHOL, HDL, LDLCALC, TRIG, CHOLHDL, LDLDIRECT in the last 72 hours. Thyroid Function Tests: No results for input(s): TSH, T4TOTAL, FREET4, T3FREE, THYROIDAB in the last 72 hours. Anemia Panel: No results for input(s): VITAMINB12, FOLATE, FERRITIN, TIBC, IRON, RETICCTPCT in the last 72 hours. Urine analysis:    Component Value Date/Time   COLORURINE YELLOW 07/05/2015 [place] 07/05/2015 1645   LABSPEC 1.025 07/05/2015 1645   PHURINE 5.0  07/05/2015 [place] 07/05/2015 [place] 07/05/2015 [place] 07/05/2015 [place] 07/05/2015 1645   PROTEINUR NEGATIVE 07/05/2015 1645   UROBILINOGEN 0.2 12/12/2010 1102   NITRITE NEGATIVE 07/05/2015 1645   LEUKOCYTESUR NEGATIVE 07/05/2015 1645   Sepsis Labs: Invalid input(s): PROCALCITONIN, LACTICIDVEN No results found for this or any previous visit (from the past 240 hour(s)).   Radiological Exams on Admission: Dg Chest 2 View  Result Date: 01/14/2016 CLINICAL DATA:  82 year old female with a history of cough congestion for 4 days EXAM: CHEST  2 VIEW COMPARISON:  06/02/2015 FINDINGS: Cardiomediastinal silhouette unchanged. Interval placement of loop recorder insertion, projecting at the lower left chest. Calcifications of the aortic arch. No confluent airspace disease, pneumothorax, or pleural effusion. IMPRESSION: No radiographic evidence of acute cardiopulmonary disease. Interval placement of loop recorder on the left chest wall. Aortic  atherosclerosis. Signed, Dulcy Fanny. Earleen Newport, DO [facility] Radiology Electronically Signed   By: Corrie Mckusick D.O.   On: 01/14/2016 14:38     Assessment/Plan Active Problems:   CKD (chronic kidney disease) stage 4, GFR 15-29 ml/min (HCC)   DM type 2 (diabetes mellitus, type 2) (HCC)   Leukocytosis   Paroxysmal atrial fibrillation (HCC)   Shortness of breath   Wheezing  Shortness of breath and wheezing -suspect viral illness-- NP swab pending -cover with rocephin/azithromycin for now-- no PNA seen on x ray -recent sick contact -steroids/nebs  CKD -daily BMP  DM -SSI -hold PO meds  Mild leukocytosis -trend with daily CBC (on steroids)  H/o a fib -appears to be in sinus -continue eliquis -has loop recorder  DVT prophylaxis: on eliquis Code Status: full Family Communication: patient Disposition Plan: obs Consults called: none Admission status: obs   Jarales DO [facility] Pager [phone]  If 7PM-7AM, please contact night-coverage [email or web address] Password [email or web address]  01/14/2016, 7:10 PM

## 2016-01-15 DIAGNOSIS — E1159 Type 2 diabetes mellitus with other circulatory complications: Secondary | ICD-10-CM | POA: Diagnosis not present

## 2016-01-15 DIAGNOSIS — D72829 Elevated white blood cell count, unspecified: Secondary | ICD-10-CM | POA: Diagnosis not present

## 2016-01-15 DIAGNOSIS — N184 Chronic kidney disease, stage 4 (severe): Secondary | ICD-10-CM

## 2016-01-15 DIAGNOSIS — R0602 Shortness of breath: Secondary | ICD-10-CM | POA: Diagnosis not present

## 2016-01-15 DIAGNOSIS — I4891 Unspecified atrial fibrillation: Secondary | ICD-10-CM | POA: Diagnosis not present

## 2016-01-15 LAB — RESPIRATORY PANEL BY PCR
ADENOVIRUS-RVPPCR: NOT DETECTED
Bordetella pertussis: NOT DETECTED
CORONAVIRUS HKU1-RVPPCR: NOT DETECTED
CORONAVIRUS NL63-RVPPCR: NOT DETECTED
Chlamydophila pneumoniae: NOT DETECTED
Coronavirus 229E: NOT DETECTED
Coronavirus OC43: NOT DETECTED
Influenza A: NOT DETECTED
Influenza B: NOT DETECTED
METAPNEUMOVIRUS-RVPPCR: NOT DETECTED
Mycoplasma pneumoniae: NOT DETECTED
PARAINFLUENZA VIRUS 2-RVPPCR: NOT DETECTED
PARAINFLUENZA VIRUS 3-RVPPCR: NOT DETECTED
Parainfluenza Virus 1: NOT DETECTED
Parainfluenza Virus 4: NOT DETECTED
RHINOVIRUS / ENTEROVIRUS - RVPPCR: NOT DETECTED
Respiratory Syncytial Virus: DETECTED — AB

## 2016-01-15 LAB — BASIC METABOLIC PANEL
ANION GAP: 9 (ref 5–15)
BUN: 29 mg/dL — ABNORMAL HIGH (ref 6–20)
CO2: 27 mmol/L (ref 22–32)
Calcium: 9.2 mg/dL (ref 8.9–10.3)
Chloride: 99 mmol/L — ABNORMAL LOW (ref 101–111)
Creatinine, Ser: 1.54 mg/dL — ABNORMAL HIGH (ref 0.44–1.00)
GFR calc non Af Amer: 30 mL/min — ABNORMAL LOW (ref >60)
GFR, EST AFRICAN AMERICAN: 35 mL/min — AB (ref >60)
GLUCOSE: 287 mg/dL — AB (ref 65–99)
POTASSIUM: 3.8 mmol/L (ref 3.5–5.1)
Sodium: 135 mmol/L (ref 135–145)

## 2016-01-15 LAB — GLUCOSE, CAPILLARY
GLUCOSE-CAPILLARY: 257 mg/dL — AB (ref 65–99)
GLUCOSE-CAPILLARY: 197 mg/dL — AB (ref 65–99)
GLUCOSE-CAPILLARY: 109 mg/dL — AB (ref 65–99)
Glucose-Capillary: 174 mg/dL — ABNORMAL HIGH (ref 65–99)

## 2016-01-15 LAB — CBC
HEMATOCRIT: 40.4 % (ref 36.0–46.0)
HEMOGLOBIN: 12.8 g/dL (ref 12.0–15.0)
MCH: 28.7 pg (ref 26.0–34.0)
MCHC: 31.7 g/dL (ref 30.0–36.0)
MCV: 90.6 fL (ref 78.0–100.0)
Platelets: 226 10*3/uL (ref 150–400)
RBC: 4.46 MIL/uL (ref 3.87–5.11)
RDW: 13.6 % (ref 11.5–15.5)
WBC: 10 10*3/uL (ref 4.0–10.5)

## 2016-01-15 LAB — HIV ANTIBODY (ROUTINE TESTING W REFLEX): HIV Screen 4th Generation wRfx: NONREACTIVE

## 2016-01-15 MED ORDER — INSULIN GLARGINE 100 UNIT/ML ~~LOC~~ SOLN
15.0000 [IU] | Freq: Two times a day (BID) | SUBCUTANEOUS | Status: DC
  Administered 2016-01-15 (×2): 15 [IU] via SUBCUTANEOUS
  Filled 2016-01-15 (×4): qty 0.15

## 2016-01-15 MED ORDER — METOPROLOL TARTRATE 25 MG PO TABS
25.0000 mg | ORAL_TABLET | Freq: Two times a day (BID) | ORAL | Status: DC
  Administered 2016-01-15 – 2016-01-16 (×3): 25 mg via ORAL
  Filled 2016-01-15 (×3): qty 1

## 2016-01-15 MED ORDER — PREDNISONE 10 MG PO TABS
50.0000 mg | ORAL_TABLET | Freq: Every day | ORAL | Status: DC
  Administered 2016-01-16: 50 mg via ORAL
  Filled 2016-01-15: qty 2

## 2016-01-15 MED ORDER — DOXYCYCLINE HYCLATE 100 MG PO TABS
100.0000 mg | ORAL_TABLET | Freq: Two times a day (BID) | ORAL | Status: DC
  Administered 2016-01-15 – 2016-01-16 (×3): 100 mg via ORAL
  Filled 2016-01-15 (×3): qty 1

## 2016-01-15 NOTE — Care Management Obs Status (Signed)
[facility] NOTIFICATION   Patient Details  Name: Amy Wiggins MRN: [id number] Date of Birth: 05-Jun-1931   Medicare Observation Status Notification Given:  Yes    Dawayne Patricia, RN 01/15/2016, 4:35 PM

## 2016-01-15 NOTE — Progress Notes (Signed)
[facility] PROGRESS NOTE    Progress Note  Amy Wiggins  VZD:[id number] DOB: [birth year] DOA: 01/14/2016 PCP: Asencion Noble, MD     Brief Narrative:   ENDIYA Wiggins is an 82 y.o. female past medical history of stroke diabetes mellitus she will she not been feeling well with a cough about 4 days prior to admission went to the ER, chest x-ray was done which was negative but she was found to be wheezing.  Assessment/Plan:   Shortness of breath due to reactive airway disease: She denies any smoking, no history of asthma. She relates her granddaughter was sick about 2 weeks ago. She denies any fever but a persistent cough. Change steroids and antibiotics to doxycycline has remained afebrile. Lactic acidosis probably due to hypoxia.  CKD (chronic kidney disease) stage 4, GFR 15-29 ml/min (HCC) Continue current regimen.  DM type 2 (diabetes mellitus, type 2) (HCC) Start lantus, cont SSI.  Atrial fibrillation with RVR: She is on another cause was started on beta blockers metoprolol.   DVT prophylaxis: lovenox Family Communication:none Disposition Plan/Barrier to D/C: home in am Code Status:     Code Status Orders        Start     Ordered   01/14/16 2055  Full code  Continuous     01/14/16 2054    Code Status History    Date Active Date Inactive Code Status Order ID Comments User Context   06/02/2015  8:31 PM 06/07/2015  7:27 PM Full Code [id number]  Reubin Milan, MD Inpatient   12/12/2010  1:03 PM 12/14/2010 10:57 AM Full Code [id number]  Asencion Noble, MD ED        IV Access:    Peripheral IV   Procedures and diagnostic studies:   Dg Chest 2 View  Result Date: 01/14/2016 CLINICAL DATA:  82 year old female with a history of cough congestion for 4 days EXAM: CHEST  2 VIEW COMPARISON:  06/02/2015 FINDINGS: Cardiomediastinal silhouette unchanged. Interval placement of loop recorder insertion, projecting at the lower left chest. Calcifications of the  aortic arch. No confluent airspace disease, pneumothorax, or pleural effusion. IMPRESSION: No radiographic evidence of acute cardiopulmonary disease. Interval placement of loop recorder on the left chest wall. Aortic atherosclerosis. Signed, Dulcy Fanny. Earleen Newport, DO [facility] Radiology Electronically Signed   By: Corrie Mckusick D.O.   On: 01/14/2016 14:38     Medical Consultants:    None.  Anti-Infectives:   Rocephin and azithro 11.20.2017  Subjective:    [name] she relates her shortness of breath and her cough are better.  Objective:    Vitals:   01/14/16 1815 01/14/16 1830 01/14/16 2009 01/15/16 0409  BP: 131/64 148/72 (!) 150/71 (!) 120/54  Pulse: 117 116 (!) 116 (!) 107  Resp: 19 20 20 20   Temp:   99.9 F (37.7 C) 97.8 F (36.6 C)  TempSrc:   Oral Oral  SpO2: 95% 96% 93% 94%  Weight:   94.3 kg (207 lb 12.8 oz)   Height:   5\' 1"  (1.549 m)     Intake/Output Summary (Last 24 hours) at 01/15/16 0800 Last data filed at 01/15/16 0500  Gross per 24 hour  Intake             1650 ml  Output                0 ml  Net  1650 ml   Filed Weights   01/14/16 1359 01/14/16 2009  Weight: 93.9 kg (207 lb) 94.3 kg (207 lb 12.8 oz)    Exam: General exam: In no acute distress. Respiratory system: Good air movement and clear to auscultation. Cardiovascular system: S1 & S2 heard, RRR. No JVD. Gastrointestinal system: Abdomen is nondistended, soft and nontender.  Extremities: No pedal edema. Skin: No rashes, lesions or ulcers Psychiatry: Judgement and insight appear normal. Mood & affect appropriate.    Data Reviewed:    Labs: Basic Metabolic Panel:  Recent Labs Lab 01/14/16 1406 01/15/16 0202  NA 135 135  K 3.7 3.8  CL 96* 99*  CO2 27 27  GLUCOSE 146* 287*  BUN 30* 29*  CREATININE 1.50* 1.54*  CALCIUM 10.2 9.2   GFR Estimated Creatinine Clearance (by C-G formula based on SCr of 1.54 mg/dL  (H)) Female: 30 mL/min Female: 36.8 mL/min Liver Function Tests: No results for input(s): AST, ALT, ALKPHOS, BILITOT, PROT, ALBUMIN in the last 168 hours. No results for input(s): LIPASE, AMYLASE in the last 168 hours. No results for input(s): AMMONIA in the last 168 hours. Coagulation profile No results for input(s): INR, PROTIME in the last 168 hours.  CBC:  Recent Labs Lab 01/14/16 1406 01/15/16 0202  WBC 10.7* 10.0  HGB 13.9 12.8  HCT 43.4 40.4  MCV 91.4 90.6  PLT 249 226   Cardiac Enzymes: No results for input(s): CKTOTAL, CKMB, CKMBINDEX, TROPONINI in the last 168 hours. BNP (last 3 results) No results for input(s): PROBNP in the last 8760 hours. CBG:  Recent Labs Lab 01/14/16 2116 01/15/16 0627  GLUCAP 185* 257*   D-Dimer: No results for input(s): DDIMER in the last 72 hours. Hgb A1c: No results for input(s): HGBA1C in the last 72 hours. Lipid Profile: No results for input(s): CHOL, HDL, LDLCALC, TRIG, CHOLHDL, LDLDIRECT in the last 72 hours. Thyroid function studies: No results for input(s): TSH, T4TOTAL, T3FREE, THYROIDAB in the last 72 hours.  Invalid input(s): FREET3 Anemia work up: No results for input(s): VITAMINB12, FOLATE, FERRITIN, TIBC, IRON, RETICCTPCT in the last 72 hours. Sepsis Labs:  Recent Labs Lab 01/14/16 1406 01/14/16 1413 01/14/16 1832 01/15/16 0202  WBC 10.7*  --   --  10.0  LATICACIDVEN  --  1.28 1.98*  --    Microbiology No results found for this or any previous visit (from the past 240 hour(s)).   Medications:   . apixaban  2.5 mg Oral BID  . atorvastatin  20 mg Oral Daily  . azithromycin  500 mg Intravenous Q24H  . cefTRIAXone (ROCEPHIN)  IV  1 g Intravenous Q24H  . glimepiride  2 mg Oral Q breakfast  . insulin aspart  0-15 Units Subcutaneous TID WC  . insulin aspart  0-5 Units Subcutaneous QHS  . liraglutide  1.8 mg Subcutaneous QHS  . losartan  100 mg Oral Daily  . magnesium oxide  400 mg Oral Daily  .  methylPREDNISolone (SOLU-MEDROL) injection  40 mg Intravenous Q12H  . multivitamin with minerals  1 tablet Oral QHS  . pantoprazole  40 mg Oral Daily  . pioglitazone  15 mg Oral Daily  . traZODone  100 mg Oral QHS  . venlafaxine XR  75 mg Oral Q breakfast   Continuous Infusions: . sodium chloride 75 mL/hr at 01/14/16 2100    Time spent: 25 min   LOS: 0 days   Charlynne Cousins  [facility] Pager [phone]  *Please refer to [email or web address], password  [email or web address] to get updated schedule on who will round on this patient, as hospitalists switch teams weekly. If 7PM-7AM, please contact night-coverage at [email or web address], password [email or web address] for any overnight needs.  01/15/2016, 8:00 AM

## 2016-01-15 NOTE — Progress Notes (Signed)
Pt ambulated 100 ft on room air. Pt's oxygen saturation was stable at 93-94%. Pt had no complaints and tolerated well.  Grant Fontana BSN, RN

## 2016-01-16 DIAGNOSIS — I480 Paroxysmal atrial fibrillation: Secondary | ICD-10-CM

## 2016-01-16 DIAGNOSIS — R0602 Shortness of breath: Secondary | ICD-10-CM

## 2016-01-16 DIAGNOSIS — E1159 Type 2 diabetes mellitus with other circulatory complications: Secondary | ICD-10-CM | POA: Diagnosis not present

## 2016-01-16 DIAGNOSIS — N184 Chronic kidney disease, stage 4 (severe): Secondary | ICD-10-CM | POA: Diagnosis not present

## 2016-01-16 DIAGNOSIS — J4520 Mild intermittent asthma, uncomplicated: Secondary | ICD-10-CM

## 2016-01-16 DIAGNOSIS — D72829 Elevated white blood cell count, unspecified: Secondary | ICD-10-CM | POA: Diagnosis not present

## 2016-01-16 LAB — GLUCOSE, CAPILLARY: GLUCOSE-CAPILLARY: 108 mg/dL — AB (ref 65–99)

## 2016-01-16 MED ORDER — PREDNISONE 10 MG PO TABS: ORAL_TABLET | ORAL | 0 refills | Status: DC

## 2016-01-16 MED ORDER — DOXYCYCLINE HYCLATE 100 MG PO TABS: 100.0000 mg | ORAL_TABLET | Freq: Two times a day (BID) | ORAL | 0 refills | Status: DC

## 2016-01-16 MED ORDER — METOPROLOL TARTRATE 25 MG PO TABS: 25.0000 mg | ORAL_TABLET | Freq: Two times a day (BID) | ORAL | 6 refills | Status: DC

## 2016-01-16 NOTE — Discharge Summary (Signed)
Physician Discharge Summary  Amy Wiggins WUJ:[id number] DOB: [birth year] DOA: 01/14/2016  PCP: Asencion Noble, MD  Admit date: 01/14/2016 Discharge date: 01/16/2016  Admitted From: Home Disposition:  Home  Recommendations for Outpatient Follow-up:  1. Follow up with PCP in 1-2 weeks 2. Please obtain BMP/CBC in one week   Home Health: No Equipment/Devices:None  Discharge Condition:Stable CODE STATUS:full Diet recommendation: Heart Healthy   Brief/Interim Summary: 82 y.o. female with medical history significant of stroke, diabetes, hypertension. Patient has not been feeling well for several days. She was seen in urgent care today and was found to have an elevated heart rate, shortness of breath, and was wheezing.  Discharge Diagnoses:  Active Problems:   CKD (chronic kidney disease) stage 4, GFR 15-29 ml/min (HCC)   DM type 2 (diabetes mellitus, type 2) (HCC)   Leukocytosis   Paroxysmal atrial fibrillation (HCC)   Shortness of breath   Wheezing   Shortness of breath due to reactive airway disease: She denies any smoking, no history of asthma. She relates her granddaughter was sick about 2 weeks ago. She denies any fever but a persistent cough. Started on steroids and antibiotics, has remained afebrile. Lactic acidosis probably due to hypoxia. Symptoms improved will cont treatment as an outpatient.  CKD (chronic kidney disease) stage 4, GFR 15-29 ml/min (HCC) Continue current regimen.  DM type 2 (diabetes mellitus, type 2) (HCC) Start lantus, cont SSI.  Sinus tachycardia: Likely due to reactive airway disease. Started on low dose  metoprolol will cont as na outpatient.  Discharge Instructions  Discharge Instructions    Diet - low sodium heart healthy    Complete by:  As directed    Increase activity slowly    Complete by:  As directed        Medication List    TAKE these medications   acetaminophen 500 MG tablet Commonly known as:  TYLENOL Take 1,000  mg by mouth See admin instructions. Take 2 tablets (1000 mg) by mouth daily at bedtime, may also take 2 tablets during the day as needed for pain   apixaban 2.5 MG Tabs tablet Commonly known as:  ELIQUIS Take 1 tablet (2.5 mg total) by mouth 2 (two) times daily.   atorvastatin 20 MG tablet Commonly known as:  LIPITOR Take 20 mg by mouth every evening. 4pm   chlorthalidone 25 MG tablet Commonly known as:  HYGROTON Take 25 mg by mouth daily. For high blood pressure/fluid   doxycycline 100 MG tablet Commonly known as:  VIBRA-TABS Take 1 tablet (100 mg total) by mouth every 12 (twelve) hours.   glimepiride 2 MG tablet Commonly known as:  AMARYL Take 2 mg by mouth daily with breakfast. For Diabetes   losartan 100 MG tablet Commonly known as:  COZAAR Take 100 mg by mouth every evening. For high blood pressure - 4pm   magnesium oxide 400 MG tablet Commonly known as:  MAG-OX Take 400 mg by mouth every evening. 4pm   metoprolol tartrate 25 MG tablet Commonly known as:  LOPRESSOR Take 1 tablet (25 mg total) by mouth 2 (two) times daily.   multivitamin with minerals Tabs tablet Take 1 tablet by mouth at bedtime. For supplement   pantoprazole 40 MG tablet Commonly known as:  PROTONIX Take 40 mg by mouth daily. For acid reflux/gerd   pioglitazone 15 MG tablet Commonly known as:  ACTOS Take 15 mg by mouth every evening. 4pm   predniSONE 10 MG tablet Commonly known as:  [place] 6  tablets for 1 days, then 5 tablets for 1 days, then 4 tablets for 1 days, then 3 tablets for 1 days, then 2 tabs for 1 days, then 1 tab for 1 days, and then stop.   PRESERVISION AREDS 2 Caps Take 1 capsule by mouth 2 (two) times daily.   traZODone 100 MG tablet Commonly known as:  DESYREL Take 100 mg by mouth at bedtime.   venlafaxine XR 75 MG 24 hr capsule Commonly known as:  EFFEXOR-XR Take 75 mg by mouth daily with breakfast.   VICTOZA 18 MG/3ML Sopn Generic drug:  liraglutide Inject  1.8 mg into the skin at bedtime.       No Known Allergies  Consultations:  None   Procedures/Studies: Dg Chest 2 View  Result Date: 01/14/2016 CLINICAL DATA:  82 year old female with a history of cough congestion for 4 days EXAM: CHEST  2 VIEW COMPARISON:  06/02/2015 FINDINGS: Cardiomediastinal silhouette unchanged. Interval placement of loop recorder insertion, projecting at the lower left chest. Calcifications of the aortic arch. No confluent airspace disease, pneumothorax, or pleural effusion. IMPRESSION: No radiographic evidence of acute cardiopulmonary disease. Interval placement of loop recorder on the left chest wall. Aortic atherosclerosis. Signed, Dulcy Fanny. Earleen Newport, DO [facility] Radiology Electronically Signed   By: Corrie Mckusick D.O.   On: 01/14/2016 14:38     Subjective: She relates her breathing is much better.  Discharge Exam: Vitals:   01/15/16 1939 01/16/16 0404  BP: (!) 153/76 130/64  Pulse: 96 89  Resp: 20 20  Temp: 98.2 F (36.8 C) 97.7 F (36.5 C)   Vitals:   01/15/16 1404 01/15/16 1939 01/15/16 1946 01/16/16 0404  BP: (!) 116/58 (!) 153/76  130/64  Pulse: 95 96  89  Resp: 18 20  20   Temp: 98 F (36.7 C) 98.2 F (36.8 C)  97.7 F (36.5 C)  TempSrc: Oral Oral  Oral  SpO2: 95% 95% 94% 93%  Weight:      Height:        General: Pt is alert, awake, not in acute distress Cardiovascular: RRR, S1/S2 +, no rubs, no gallops Respiratory: CTA bilaterally, no wheezing, no rhonchi Abdominal: Soft, NT, ND, bowel sounds + Extremities: no edema, no cyanosis    The results of significant diagnostics from this hospitalization (including imaging, microbiology, ancillary and laboratory) are listed below for reference.     Microbiology: Recent Results (from the past 240 hour(s))  Blood culture (routine x 2)     Status: None (Preliminary result)   Collection Time: 01/14/16  6:15 PM  Result Value Ref Range  Status   Specimen Description BLOOD RIGHT ANTECUBITAL  Final   Special Requests BOTTLES DRAWN AEROBIC AND ANAEROBIC 5CC  Final   Culture NO GROWTH < 24 HOURS  Final   Report Status PENDING  Incomplete  Blood culture (routine x 2)     Status: None (Preliminary result)   Collection Time: 01/14/16  6:20 PM  Result Value Ref Range Status   Specimen Description BLOOD LEFT WRIST  Final   Special Requests BOTTLES DRAWN AEROBIC AND ANAEROBIC 5CC  Final   Culture NO GROWTH < 24 HOURS  Final   Report Status PENDING  Incomplete  Respiratory Panel by PCR     Status: Abnormal   Collection Time: 01/15/16 12:01 AM  Result Value Ref Range Status   Adenovirus NOT DETECTED NOT DETECTED Final   Coronavirus 229E NOT DETECTED NOT DETECTED Final   Coronavirus HKU1  NOT DETECTED NOT DETECTED Final   Coronavirus NL63 NOT DETECTED NOT DETECTED Final   Coronavirus OC43 NOT DETECTED NOT DETECTED Final   Metapneumovirus NOT DETECTED NOT DETECTED Final   Rhinovirus / Enterovirus NOT DETECTED NOT DETECTED Final   Influenza A NOT DETECTED NOT DETECTED Final   Influenza B NOT DETECTED NOT DETECTED Final   Parainfluenza Virus 1 NOT DETECTED NOT DETECTED Final   Parainfluenza Virus 2 NOT DETECTED NOT DETECTED Final   Parainfluenza Virus 3 NOT DETECTED NOT DETECTED Final   Parainfluenza Virus 4 NOT DETECTED NOT DETECTED Final   Respiratory Syncytial Virus DETECTED (A) NOT DETECTED Final    Comment: CRITICAL RESULT CALLED TO, READ BACK BY AND VERIFIED WITH: L. Moffitt RN 11:30 01/15/16 ([email or web address])    Bordetella pertussis NOT DETECTED NOT DETECTED Final   Chlamydophila pneumoniae NOT DETECTED NOT DETECTED Final   Mycoplasma pneumoniae NOT DETECTED NOT DETECTED Final     Labs: BNP (last 3 results)  Recent Labs  01/14/16 1600  BNP 951.8*   Basic Metabolic Panel:  Recent Labs Lab 01/14/16 1406 01/15/16 0202  NA 135 135  K 3.7 3.8  CL 96* 99*  CO2 27 27  GLUCOSE 146* 287*  BUN 30* 29*  CREATININE 1.50*  1.54*  CALCIUM 10.2 9.2   Liver Function Tests: No results for input(s): AST, ALT, ALKPHOS, BILITOT, PROT, ALBUMIN in the last 168 hours. No results for input(s): LIPASE, AMYLASE in the last 168 hours. No results for input(s): AMMONIA in the last 168 hours. CBC:  Recent Labs Lab 01/14/16 1406 01/15/16 0202  WBC 10.7* 10.0  HGB 13.9 12.8  HCT 43.4 40.4  MCV 91.4 90.6  PLT 249 226   Cardiac Enzymes: No results for input(s): CKTOTAL, CKMB, CKMBINDEX, TROPONINI in the last 168 hours. BNP: Invalid input(s): POCBNP CBG:  Recent Labs Lab 01/15/16 0627 01/15/16 1115 01/15/16 1632 01/15/16 2133 01/16/16 0614  GLUCAP 257* 174* 197* 109* 108*   D-Dimer No results for input(s): DDIMER in the last 72 hours. Hgb A1c No results for input(s): HGBA1C in the last 72 hours. Lipid Profile No results for input(s): CHOL, HDL, LDLCALC, TRIG, CHOLHDL, LDLDIRECT in the last 72 hours. Thyroid function studies No results for input(s): TSH, T4TOTAL, T3FREE, THYROIDAB in the last 72 hours.  Invalid input(s): FREET3 Anemia work up No results for input(s): VITAMINB12, FOLATE, FERRITIN, TIBC, IRON, RETICCTPCT in the last 72 hours. Urinalysis    Component Value Date/Time   COLORURINE YELLOW 07/05/2015 [place] 07/05/2015 1645   LABSPEC 1.025 07/05/2015 1645   PHURINE 5.0 07/05/2015 [place] 07/05/2015 1645   HGBUR NEGATIVE 07/05/2015 1645   BILIRUBINUR NEGATIVE 07/05/2015 1645   KETONESUR NEGATIVE 07/05/2015 1645   PROTEINUR NEGATIVE 07/05/2015 1645   UROBILINOGEN 0.2 12/12/2010 1102   NITRITE NEGATIVE 07/05/2015 1645   LEUKOCYTESUR NEGATIVE 07/05/2015 1645   Sepsis Labs Invalid input(s): PROCALCITONIN,  WBC,  LACTICIDVEN Microbiology Recent Results (from the past 240 hour(s))  Blood culture (routine x 2)     Status: None (Preliminary result)   Collection Time: 01/14/16  6:15 PM  Result Value Ref Range Status   Specimen Description BLOOD RIGHT  ANTECUBITAL  Final   Special Requests BOTTLES DRAWN AEROBIC AND ANAEROBIC 5CC  Final   Culture NO GROWTH < 24 HOURS  Final   Report Status PENDING  Incomplete  Blood culture (routine x 2)     Status: None (Preliminary result)   Collection Time: 01/14/16  6:20  PM  Result Value Ref Range Status   Specimen Description BLOOD LEFT WRIST  Final   Special Requests BOTTLES DRAWN AEROBIC AND ANAEROBIC 5CC  Final   Culture NO GROWTH < 24 HOURS  Final   Report Status PENDING  Incomplete  Respiratory Panel by PCR     Status: Abnormal   Collection Time: 01/15/16 12:01 AM  Result Value Ref Range Status   Adenovirus NOT DETECTED NOT DETECTED Final   Coronavirus 229E NOT DETECTED NOT DETECTED Final   Coronavirus HKU1 NOT DETECTED NOT DETECTED Final   Coronavirus NL63 NOT DETECTED NOT DETECTED Final   Coronavirus OC43 NOT DETECTED NOT DETECTED Final   Metapneumovirus NOT DETECTED NOT DETECTED Final   Rhinovirus / Enterovirus NOT DETECTED NOT DETECTED Final   Influenza A NOT DETECTED NOT DETECTED Final   Influenza B NOT DETECTED NOT DETECTED Final   Parainfluenza Virus 1 NOT DETECTED NOT DETECTED Final   Parainfluenza Virus 2 NOT DETECTED NOT DETECTED Final   Parainfluenza Virus 3 NOT DETECTED NOT DETECTED Final   Parainfluenza Virus 4 NOT DETECTED NOT DETECTED Final   Respiratory Syncytial Virus DETECTED (A) NOT DETECTED Final    Comment: CRITICAL RESULT CALLED TO, READ BACK BY AND VERIFIED WITH: L. Moffitt RN 11:30 01/15/16 ([email or web address])    Bordetella pertussis NOT DETECTED NOT DETECTED Final   Chlamydophila pneumoniae NOT DETECTED NOT DETECTED Final   Mycoplasma pneumoniae NOT DETECTED NOT DETECTED Final     Time coordinating discharge: Over 30 minutes  SIGNED:   Charlynne Cousins, MD  [facility] 01/16/2016, 8:05 AM Pager   If 7PM-7AM, please contact night-coverage [email or web address] Password [email or web address]

## 2016-01-16 NOTE — Progress Notes (Signed)
Pt has been discharged home with family. IV and telemetry box were removed. Pt received discharge instructions and all questions were answered. Pt received prescription for prednisone and was instructed to get it filled at her pharmacy. Pt verbalized understanding. Pt left with all of her belongings. Pt left the unit via wheelchair and was accompanied by a nurse tech.   Grant Fontana BSN, RN

## 2016-01-19 LAB — CULTURE, BLOOD (ROUTINE X 2)
Culture: NO GROWTH
Culture: NO GROWTH

## 2016-02-02 ENCOUNTER — Ambulatory Visit (INDEPENDENT_AMBULATORY_CARE_PROVIDER_SITE_OTHER): Payer: Medicare Other | Admitting: *Deleted

## 2016-02-02 DIAGNOSIS — Z4509 Encounter for adjustment and management of other cardiac device: Secondary | ICD-10-CM

## 2016-02-05 NOTE — Progress Notes (Signed)
Carelink Summary Report / Loop Recorder 

## 2016-02-17 LAB — CUP PACEART REMOTE DEVICE CHECK
Date Time Interrogation Session: 20171108214326
Implantable Pulse Generator Implant Date: 20170412

## 2016-02-17 NOTE — Progress Notes (Signed)
[facility] summary report received. Battery status OK. Normal device function. No new symptom episodes, tachy episodes, brady, or pause episodes. 6 AF episodes 0.1% +Eliquis. Monthly summary reports and ROV/PRN

## 2016-02-21 ENCOUNTER — Encounter (HOSPITAL_COMMUNITY): Payer: Self-pay | Admitting: Emergency Medicine

## 2016-02-21 ENCOUNTER — Inpatient Hospital Stay (HOSPITAL_COMMUNITY)
Admission: EM | Admit: 2016-02-21 | Discharge: 2016-02-24 | DRG: 065 | Disposition: A | Discharge status: Home Health | Payer: Medicare Other | Attending: Internal Medicine | Admitting: Internal Medicine

## 2016-02-21 ENCOUNTER — Emergency Department (HOSPITAL_COMMUNITY): Payer: Medicare Other

## 2016-02-21 DIAGNOSIS — E785 Hyperlipidemia, unspecified: Secondary | ICD-10-CM | POA: Diagnosis not present

## 2016-02-21 DIAGNOSIS — N179 Acute kidney failure, unspecified: Secondary | ICD-10-CM | POA: Diagnosis present

## 2016-02-21 DIAGNOSIS — I480 Paroxysmal atrial fibrillation: Secondary | ICD-10-CM | POA: Diagnosis present

## 2016-02-21 DIAGNOSIS — K219 Gastro-esophageal reflux disease without esophagitis: Secondary | ICD-10-CM | POA: Diagnosis not present

## 2016-02-21 DIAGNOSIS — R29818 Other symptoms and signs involving the nervous system: Secondary | ICD-10-CM | POA: Diagnosis not present

## 2016-02-21 DIAGNOSIS — I5032 Chronic diastolic (congestive) heart failure: Secondary | ICD-10-CM | POA: Diagnosis not present

## 2016-02-21 DIAGNOSIS — I6523 Occlusion and stenosis of bilateral carotid arteries: Secondary | ICD-10-CM | POA: Diagnosis not present

## 2016-02-21 DIAGNOSIS — E1142 Type 2 diabetes mellitus with diabetic polyneuropathy: Secondary | ICD-10-CM | POA: Diagnosis present

## 2016-02-21 DIAGNOSIS — R000 Tachycardia, unspecified: Secondary | ICD-10-CM | POA: Diagnosis present

## 2016-02-21 DIAGNOSIS — I10 Essential (primary) hypertension: Secondary | ICD-10-CM | POA: Diagnosis present

## 2016-02-21 DIAGNOSIS — H538 Other visual disturbances: Secondary | ICD-10-CM | POA: Diagnosis not present

## 2016-02-21 DIAGNOSIS — E876 Hypokalemia: Secondary | ICD-10-CM | POA: Diagnosis present

## 2016-02-21 DIAGNOSIS — Z794 Long term (current) use of insulin: Secondary | ICD-10-CM | POA: Diagnosis not present

## 2016-02-21 DIAGNOSIS — Z8673 Personal history of transient ischemic attack (TIA), and cerebral infarction without residual deficits: Secondary | ICD-10-CM

## 2016-02-21 DIAGNOSIS — N183 Chronic kidney disease, stage 3 (moderate): Secondary | ICD-10-CM | POA: Diagnosis not present

## 2016-02-21 DIAGNOSIS — Z7901 Long term (current) use of anticoagulants: Secondary | ICD-10-CM | POA: Diagnosis not present

## 2016-02-21 DIAGNOSIS — Z823 Family history of stroke: Secondary | ICD-10-CM | POA: Diagnosis not present

## 2016-02-21 DIAGNOSIS — E1159 Type 2 diabetes mellitus with other circulatory complications: Secondary | ICD-10-CM | POA: Diagnosis not present

## 2016-02-21 DIAGNOSIS — E118 Type 2 diabetes mellitus with unspecified complications: Secondary | ICD-10-CM

## 2016-02-21 DIAGNOSIS — I639 Cerebral infarction, unspecified: Secondary | ICD-10-CM | POA: Diagnosis present

## 2016-02-21 DIAGNOSIS — Z79899 Other long term (current) drug therapy: Secondary | ICD-10-CM | POA: Diagnosis not present

## 2016-02-21 DIAGNOSIS — I130 Hypertensive heart and chronic kidney disease with heart failure and stage 1 through stage 4 chronic kidney disease, or unspecified chronic kidney disease: Secondary | ICD-10-CM | POA: Diagnosis not present

## 2016-02-21 DIAGNOSIS — E1122 Type 2 diabetes mellitus with diabetic chronic kidney disease: Secondary | ICD-10-CM | POA: Diagnosis not present

## 2016-02-21 DIAGNOSIS — R42 Dizziness and giddiness: Secondary | ICD-10-CM | POA: Diagnosis not present

## 2016-02-21 DIAGNOSIS — G4700 Insomnia, unspecified: Secondary | ICD-10-CM | POA: Diagnosis present

## 2016-02-21 DIAGNOSIS — I6340 Cerebral infarction due to embolism of unspecified cerebral artery: Principal | ICD-10-CM | POA: Diagnosis present

## 2016-02-21 DIAGNOSIS — Z6836 Body mass index (BMI) 36.0-36.9, adult: Secondary | ICD-10-CM

## 2016-02-21 DIAGNOSIS — F329 Major depressive disorder, single episode, unspecified: Secondary | ICD-10-CM | POA: Diagnosis present

## 2016-02-21 DIAGNOSIS — E669 Obesity, unspecified: Secondary | ICD-10-CM | POA: Diagnosis present

## 2016-02-21 HISTORY — DX: Paroxysmal atrial fibrillation: I48.0

## 2016-02-21 LAB — I-STAT CHEM 8, ED
BUN: 34 mg/dL — ABNORMAL HIGH (ref 6–20)
CHLORIDE: 96 mmol/L — AB (ref 101–111)
Calcium, Ion: 1.26 mmol/L (ref 1.15–1.40)
Creatinine, Ser: 2.3 mg/dL — ABNORMAL HIGH (ref 0.44–1.00)
Glucose, Bld: 151 mg/dL — ABNORMAL HIGH (ref 65–99)
HCT: 40 % (ref 36.0–46.0)
Hemoglobin: 13.6 g/dL (ref 12.0–15.0)
POTASSIUM: 3.7 mmol/L (ref 3.5–5.1)
SODIUM: 136 mmol/L (ref 135–145)
TCO2: 30 mmol/L (ref 0–100)

## 2016-02-21 LAB — CBC
HCT: 40.6 % (ref 36.0–46.0)
HEMOGLOBIN: 12.6 g/dL (ref 12.0–15.0)
MCH: 28.6 pg (ref 26.0–34.0)
MCHC: 31 g/dL (ref 30.0–36.0)
MCV: 92.1 fL (ref 78.0–100.0)
PLATELETS: 273 10*3/uL (ref 150–400)
RBC: 4.41 MIL/uL (ref 3.87–5.11)
RDW: 14.6 % (ref 11.5–15.5)
WBC: 7.4 10*3/uL (ref 4.0–10.5)

## 2016-02-21 LAB — DIFFERENTIAL
BASOS ABS: 0 10*3/uL (ref 0.0–0.1)
Basophils Relative: 0 %
EOS PCT: 4 %
Eosinophils Absolute: 0.3 10*3/uL (ref 0.0–0.7)
LYMPHS PCT: 32 %
Lymphs Abs: 2.4 10*3/uL (ref 0.7–4.0)
Monocytes Absolute: 0.5 10*3/uL (ref 0.1–1.0)
Monocytes Relative: 7 %
NEUTROS ABS: 4.2 10*3/uL (ref 1.7–7.7)
NEUTROS PCT: 57 %

## 2016-02-21 LAB — CBG MONITORING, ED: GLUCOSE-CAPILLARY: 203 mg/dL — AB (ref 65–99)

## 2016-02-21 LAB — COMPREHENSIVE METABOLIC PANEL
ALBUMIN: 3.4 g/dL — AB (ref 3.5–5.0)
ALK PHOS: 90 U/L (ref 38–126)
ALT: 18 U/L (ref 14–54)
AST: 25 U/L (ref 15–41)
Anion gap: 11 (ref 5–15)
BUN: 29 mg/dL — AB (ref 6–20)
CALCIUM: 10.3 mg/dL (ref 8.9–10.3)
CO2: 29 mmol/L (ref 22–32)
Chloride: 96 mmol/L — ABNORMAL LOW (ref 101–111)
Creatinine, Ser: 2.2 mg/dL — ABNORMAL HIGH (ref 0.44–1.00)
GFR calc Af Amer: 23 mL/min — ABNORMAL LOW (ref >60)
GFR calc non Af Amer: 20 mL/min — ABNORMAL LOW (ref >60)
GLUCOSE: 152 mg/dL — AB (ref 65–99)
Potassium: 3.7 mmol/L (ref 3.5–5.1)
SODIUM: 136 mmol/L (ref 135–145)
Total Bilirubin: 0.5 mg/dL (ref 0.3–1.2)
Total Protein: 6.2 g/dL — ABNORMAL LOW (ref 6.5–8.1)

## 2016-02-21 LAB — PROTIME-INR
INR: 0.97
PROTHROMBIN TIME: 12.9 s (ref 11.4–15.2)

## 2016-02-21 LAB — I-STAT TROPONIN, ED: Troponin i, poc: 0.06 ng/mL (ref 0.00–0.08)

## 2016-02-21 LAB — APTT: APTT: 31 s (ref 24–36)

## 2016-02-21 NOTE — ED Triage Notes (Signed)
Pt sts blurred vision x 2 days; pt denies obvious weakness or other neuro deficit; pt sts some pain behind left eye

## 2016-02-21 NOTE — ED Notes (Signed)
CBG 203 

## 2016-02-21 NOTE — ED Provider Notes (Signed)
Amy Wiggins Provider Note   CSN: [id number] Arrival date & time: 02/21/16  1801     History   Chief Complaint Chief Complaint  Patient presents with  . Blurred Vision    HPI Amy Wiggins is a 82 y.o. female.  HPI  Patient has PMH of depression, diabetes, hypertension, stroke, CKD, hx of paroxysmal a. Fib, chronic anticoagulation on Eliquis, TIAs comes to the ER with blurry vision for 2 days. She woke up with the blurry vision. She has not had any other associated symptoms with this. She endorses sensation of pain behind the left eye and now the right, this started while in the waiting room and she feels like it is the lights and having to wait so long. Denies that she has had recent fall, injury to the face, watery eyes. She has not seen her ophthalmologist.   Past Medical History:  Diagnosis Date  . Depression   . Diabetes mellitus   . Hypertension   . Stroke ([place])    TIA's x 2    Patient Active Problem List   Diagnosis Date Noted  . Shortness of breath 01/14/2016  . Wheezing 01/14/2016  . Paroxysmal atrial fibrillation ([place]) 11/14/2015  . Chronic anticoagulation 11/14/2015  . Cerebrovascular accident (CVA) due to embolism of right posterior cerebral artery ([place]) 08/08/2015  . Essential hypertension 08/08/2015  . HLD (hyperlipidemia) 08/08/2015  . Type 2 diabetes mellitus with circulatory disorder ([place]) 08/08/2015  . PFO (patent foramen ovale)   . ARF (acute renal failure) ([place]) 06/04/2015  . Cerebral infarction due to unspecified mechanism   . Acute CVA (cerebrovascular accident) ([place]) 06/02/2015  . Hypertension 06/02/2015  . Hyperlipidemia 06/02/2015  . CKD (chronic kidney disease) stage 4, GFR 15-29 ml/min (HCC) 06/02/2015  . DM type 2 (diabetes mellitus, type 2) ([place]) 06/02/2015  . Leukocytosis 06/02/2015  . Muscle weakness (generalized) 09/16/2012  . Pain in joint, shoulder region 09/16/2012  . Closed displaced fracture of lateral end of  clavicle 08/18/2012    Past Surgical History:  Procedure Laterality Date  . COLONOSCOPY    . COLONOSCOPY N/A 09/09/2014   Procedure: COLONOSCOPY;  Surgeon: Rogene Houston, MD;  [facility];  Service: Endoscopy;  Laterality: N/A;  1010 - moved to 7:30 - Ann to notify  . EP IMPLANTABLE DEVICE N/A 06/07/2015   Procedure: Loop Recorder Insertion;  Surgeon: Deboraha Sprang, MD;  Location: [place] [facility];  Service: Cardiovascular;  Laterality: N/A;  . TEE WITHOUT CARDIOVERSION N/A 06/07/2015   Procedure: TRANSESOPHAGEAL ECHOCARDIOGRAM (TEE);  Surgeon: Thayer Headings, MD;  Location: [place];  Service: Cardiovascular;  Laterality: N/A;    OB History    No data available       Home Medications    Prior to Admission medications   Medication Sig Start Date End Date Taking? Authorizing Provider  acetaminophen (TYLENOL) 500 MG tablet Take 1,000 mg by mouth See admin instructions. Take 2 tablets (1000 mg) by mouth daily at bedtime, may also take 2 tablets during the day as needed for pain   Yes Historical Provider, MD  apixaban (ELIQUIS) 2.5 MG TABS tablet Take 1 tablet (2.5 mg total) by mouth 2 (two) times daily. 10/20/15  Yes Sherran Needs, NP  atorvastatin (LIPITOR) 20 MG tablet Take 20 mg by mouth every evening. 4pm   Yes Historical Provider, MD  chlorthalidone (HYGROTON) 25 MG tablet Take 25 mg by mouth daily. For high blood pressure/fluid 01/03/13  Yes Historical Provider,  MD  glimepiride (AMARYL) 2 MG tablet Take 2 mg by mouth daily with breakfast. For Diabetes   Yes Historical Provider, MD  Liraglutide (VICTOZA) 18 MG/3ML SOPN Inject 1.8 mg into the skin at bedtime.    Yes Historical Provider, MD  losartan (COZAAR) 100 MG tablet Take 100 mg by mouth every evening. For high blood pressure - 4pm   Yes Historical Provider, MD  magnesium oxide (MAG-OX) 400 MG tablet Take 400 mg by mouth every evening. 4pm   Yes Historical Provider, MD  metoprolol tartrate (LOPRESSOR) 25 MG  tablet Take 1 tablet (25 mg total) by mouth 2 (two) times daily. 01/16/16  Yes Charlynne Cousins, MD  Multiple Vitamin (MULTIVITAMIN WITH MINERALS) TABS tablet Take 1 tablet by mouth at bedtime. For supplement   Yes Historical Provider, MD  Multiple Vitamins-Minerals (PRESERVISION AREDS 2) CAPS Take 1 capsule by mouth 2 (two) times daily.   Yes Historical Provider, MD  pantoprazole (PROTONIX) 40 MG tablet Take 40 mg by mouth daily. For acid reflux/gerd   Yes Historical Provider, MD  pioglitazone (ACTOS) 15 MG tablet Take 15 mg by mouth every evening. 4pm   Yes Historical Provider, MD  traZODone (DESYREL) 100 MG tablet Take 100 mg by mouth at bedtime.   Yes Historical Provider, MD  venlafaxine XR (EFFEXOR-XR) 75 MG 24 hr capsule Take 75 mg by mouth daily with breakfast.   Yes Historical Provider, MD  doxycycline (VIBRA-TABS) 100 MG tablet Take 1 tablet (100 mg total) by mouth every 12 (twelve) hours. Patient not taking: Reported on 02/22/2016 01/16/16   Charlynne Cousins, MD  predniSONE (DELTASONE) 10 MG tablet Takes 6 tablets for 1 days, then 5 tablets for 1 days, then 4 tablets for 1 days, then 3 tablets for 1 days, then 2 tabs for 1 days, then 1 tab for 1 days, and then stop. Patient not taking: Reported on 02/22/2016 01/16/16   Charlynne Cousins, MD   Family History Family History  Problem Relation Age of Onset  . Stroke Father   . Dementia Sister    Social History Social History  Substance Use Topics  . Smoking status: Never Smoker  . Smokeless tobacco: Never Used  . Alcohol use No   Allergies   Patient has no known allergies.  Review of Systems Review of Systems Review of Systems All other systems negative except as documented in the HPI. All pertinent positives and negatives as reviewed in the HPI.  Physical Exam Updated Vital Signs BP 101/69   Pulse 101   Temp 99.1 F (37.3 C) (Oral)   Resp 19   SpO2 93%   Physical Exam  Constitutional: She is oriented to  person, place, and time. She appears well-developed and well-nourished.  HENT:  Head: Normocephalic and atraumatic.  Right Ear: External ear normal.  Left Ear: External ear normal.  Nose: Nose normal.  Mouth/Throat: Oropharynx is clear and moist.  Bilateral TMs normal no erythema or bulging.  Eyes: EOM are normal. Pupils are equal, round, and reactive to light.  Bilateral cataract repairs. Extraocular motions intact. Vision is grossly intact. Neck: Neck supple.  Cardiovascular: Normal rate, regular rhythm, normal heart sounds and intact distal pulses.   Pulmonary/Chest: Effort normal and breath sounds normal.  Abdominal: Soft. Bowel sounds are normal. She exhibits no distension. There is no tenderness.  Musculoskeletal: Normal range of motion. She exhibits no edema or tenderness.  Neurological: She is alert and oriented to person, place, and time. She has normal  strength. No cranial nerve deficit. She exhibits normal muscle tone. Coordination normal. GCS eye subscore is 4. GCS verbal subscore is 5. GCS motor subscore is 6.  Upper extremity strength 5 out of 5. Lower strength strength 5 out of 5. No sensory deficit to light touch. Cognitive function normal. Speech clear.  Skin: Skin is warm, dry and intact.   ED Treatments / Results  Labs (all labs ordered are listed, but only abnormal results are displayed) Labs Reviewed  COMPREHENSIVE METABOLIC PANEL - Abnormal; Notable for the following:       Result Value   Chloride 96 (*)    Glucose, Bld 152 (*)    BUN 29 (*)    Creatinine, Ser 2.20 (*)    Total Protein 6.2 (*)    Albumin 3.4 (*)    GFR calc non Af Amer 20 (*)    GFR calc Af Amer 23 (*)    All other components within normal limits  CBG MONITORING, ED - Abnormal; Notable for the following:    Glucose-Capillary 203 (*)    All other components within normal limits  I-STAT CHEM 8, ED - Abnormal; Notable for the following:    Chloride 96 (*)    BUN 34 (*)    Creatinine, Ser  2.30 (*)    Glucose, Bld 151 (*)    All other components within normal limits  PROTIME-INR  APTT  CBC  DIFFERENTIAL  I-STAT TROPOININ, ED    EKG  EKG Interpretation None       Radiology Ct Head Wo Contrast  Result Date: 02/21/2016 CLINICAL DATA:  Stroke-like symptoms for more than 8 hours. Blurred vision in the left eye since yesterday. EXAM: CT HEAD WITHOUT CONTRAST TECHNIQUE: Contiguous axial images were obtained from the base of the skull through the vertex without intravenous contrast. COMPARISON:  MRI brain 06/02/2015.  CT head 06/02/2015. FINDINGS: Brain: There is no evidence of acute intracranial hemorrhage, mass lesion, brain edema or extra-axial fluid collection. The ventricles and subarachnoid spaces are appropriately sized for age. There is no CT evidence of acute cortical infarction. There is encephalomalacia within the right occipital lobe at the site of the previously demonstrated acute infarct. Chronic infarcts are present in the posterior right parietal lobe and left cerebellum. Vascular: Intracranial vascular calcifications. No hyperdense vascular sign. Skull: Negative for fracture or focal lesion. Sinuses/Orbits: The visualized paranasal sinuses and mastoid air cells are clear. No orbital abnormalities are seen. Other: None.   IMPRESSION: 1. No acute intracranial findings. 2. Expected evolution of previously demonstrated right occipital stroke Electronically Signed   By: Richardean Sale M.D.   On: 02/21/2016 20:04    MRI IMPRESSION: 1. Acute ischemia within the left occipital lobe and within the right cerebellum. Multiple vascular territory involvement suggests a central embolic process. 2. No hemorrhage, midline shift or mass effect. 3. Old right occipital lobe infarct and chronic microvascular ischemia.   Electronically Signed By: Ulyses Jarred M.D. On: 02/22/2016 01:29  Procedures Procedures (including critical care time)  Medications Ordered in  ED Medications - No data to display  Initial Impression / Assessment and Plan / ED Course  I have reviewed the triage vital signs and the nursing notes.  Pertinent labs & imaging results that were available during my care of the patient were reviewed by me and considered in my medical decision making (see chart for details).  Clinical Course    10: 35 pm Head CT is grossly normal, shows old stroke -- placing  call to Neurology  CRITICAL CARE Performed by: Linus Mako Total critical care time: 45 minutes Critical care time was exclusive of separately billable procedures and treating other patients. Critical care was necessary to treat or prevent imminent or life-threatening deterioration. Critical care was time spent personally by me on the following activities: development of treatment plan with patient and/or surrogate as well as nursing, discussions with consultants, evaluation of patient's response to treatment, examination of patient, obtaining history from patient or surrogate, ordering and performing treatments and interventions, ordering and review of laboratory studies, ordering and review of radiographic studies, pulse oximetry and re-evaluation of patient's condition.  Discussed case with Dr. Nicole Kindred due to concern for possible stroke, he recommends MRI.  1:40 am- pts MRI has resulted showing acute stroke to left occipital region within the right cerebellum. Discussed case with Dr. Nicole Kindred who is going to perform a formal consult with recommendations. Dr Betsey Holiday has seen patient as well.  Patient to be admitted to [facility], [facility] admits, telemetry, obs - Dr. Blaine Hamper   Final Clinical Impressions(s) / ED Diagnoses   Final diagnoses:  Cerebellar infarct [facility])    New Prescriptions New Prescriptions   No medications on file     Delos Haring, Hershal Coria 02/22/16 1100    Delos Haring, PA-C 02/22/16 0205    Orpah Greek, MD 02/22/16 [phone]

## 2016-02-21 NOTE — ED Notes (Addendum)
Pt states she has been dizzy for 2 days and pain behind both eyes. Pt reports blurred vision as well. Pt reports a history of vertigo and fluid behind the ears.

## 2016-02-22 ENCOUNTER — Inpatient Hospital Stay (HOSPITAL_COMMUNITY): Payer: Medicare Other

## 2016-02-22 ENCOUNTER — Emergency Department (HOSPITAL_COMMUNITY): Payer: Medicare Other

## 2016-02-22 ENCOUNTER — Encounter (HOSPITAL_COMMUNITY): Payer: Self-pay | Admitting: *Deleted

## 2016-02-22 DIAGNOSIS — H538 Other visual disturbances: Secondary | ICD-10-CM | POA: Diagnosis not present

## 2016-02-22 DIAGNOSIS — G4700 Insomnia, unspecified: Secondary | ICD-10-CM | POA: Diagnosis present

## 2016-02-22 DIAGNOSIS — Z7901 Long term (current) use of anticoagulants: Secondary | ICD-10-CM | POA: Diagnosis not present

## 2016-02-22 DIAGNOSIS — N179 Acute kidney failure, unspecified: Secondary | ICD-10-CM

## 2016-02-22 DIAGNOSIS — Z8673 Personal history of transient ischemic attack (TIA), and cerebral infarction without residual deficits: Secondary | ICD-10-CM | POA: Diagnosis not present

## 2016-02-22 DIAGNOSIS — Z79899 Other long term (current) drug therapy: Secondary | ICD-10-CM | POA: Diagnosis not present

## 2016-02-22 DIAGNOSIS — I638 Other cerebral infarction: Secondary | ICD-10-CM | POA: Diagnosis not present

## 2016-02-22 DIAGNOSIS — I639 Cerebral infarction, unspecified: Secondary | ICD-10-CM | POA: Diagnosis not present

## 2016-02-22 DIAGNOSIS — I480 Paroxysmal atrial fibrillation: Secondary | ICD-10-CM | POA: Diagnosis not present

## 2016-02-22 DIAGNOSIS — I6523 Occlusion and stenosis of bilateral carotid arteries: Secondary | ICD-10-CM | POA: Diagnosis present

## 2016-02-22 DIAGNOSIS — I10 Essential (primary) hypertension: Secondary | ICD-10-CM

## 2016-02-22 DIAGNOSIS — E876 Hypokalemia: Secondary | ICD-10-CM | POA: Diagnosis present

## 2016-02-22 DIAGNOSIS — E785 Hyperlipidemia, unspecified: Secondary | ICD-10-CM | POA: Diagnosis present

## 2016-02-22 DIAGNOSIS — E1142 Type 2 diabetes mellitus with diabetic polyneuropathy: Secondary | ICD-10-CM | POA: Diagnosis present

## 2016-02-22 DIAGNOSIS — F329 Major depressive disorder, single episode, unspecified: Secondary | ICD-10-CM | POA: Diagnosis present

## 2016-02-22 DIAGNOSIS — K219 Gastro-esophageal reflux disease without esophagitis: Secondary | ICD-10-CM | POA: Diagnosis present

## 2016-02-22 DIAGNOSIS — R42 Dizziness and giddiness: Secondary | ICD-10-CM | POA: Diagnosis not present

## 2016-02-22 DIAGNOSIS — I5032 Chronic diastolic (congestive) heart failure: Secondary | ICD-10-CM

## 2016-02-22 DIAGNOSIS — E669 Obesity, unspecified: Secondary | ICD-10-CM | POA: Diagnosis present

## 2016-02-22 DIAGNOSIS — N183 Chronic kidney disease, stage 3 (moderate): Secondary | ICD-10-CM | POA: Diagnosis present

## 2016-02-22 DIAGNOSIS — E1159 Type 2 diabetes mellitus with other circulatory complications: Secondary | ICD-10-CM | POA: Diagnosis present

## 2016-02-22 DIAGNOSIS — I130 Hypertensive heart and chronic kidney disease with heart failure and stage 1 through stage 4 chronic kidney disease, or unspecified chronic kidney disease: Secondary | ICD-10-CM | POA: Diagnosis present

## 2016-02-22 DIAGNOSIS — E1122 Type 2 diabetes mellitus with diabetic chronic kidney disease: Secondary | ICD-10-CM | POA: Diagnosis present

## 2016-02-22 DIAGNOSIS — R000 Tachycardia, unspecified: Secondary | ICD-10-CM | POA: Diagnosis present

## 2016-02-22 DIAGNOSIS — Z794 Long term (current) use of insulin: Secondary | ICD-10-CM | POA: Diagnosis not present

## 2016-02-22 DIAGNOSIS — Z823 Family history of stroke: Secondary | ICD-10-CM | POA: Diagnosis not present

## 2016-02-22 DIAGNOSIS — Z6836 Body mass index (BMI) 36.0-36.9, adult: Secondary | ICD-10-CM | POA: Diagnosis not present

## 2016-02-22 DIAGNOSIS — E118 Type 2 diabetes mellitus with unspecified complications: Secondary | ICD-10-CM

## 2016-02-22 DIAGNOSIS — I6340 Cerebral infarction due to embolism of unspecified cerebral artery: Secondary | ICD-10-CM | POA: Diagnosis present

## 2016-02-22 LAB — VAS US CAROTID
LCCADDIAS: 16 cm/s
LEFT ECA DIAS: -9 cm/s
LEFT VERTEBRAL DIAS: 12 cm/s
LICADSYS: -113 cm/s
Left CCA dist sys: 77 cm/s
Left CCA prox dias: 24 cm/s
Left CCA prox sys: 106 cm/s
Left ICA dist dias: -40 cm/s
Left ICA prox dias: 25 cm/s
Left ICA prox sys: 94 cm/s
RCCADSYS: -75 cm/s
RIGHT ECA DIAS: -17 cm/s
RIGHT VERTEBRAL DIAS: -10 cm/s
Right CCA prox dias: 17 cm/s
Right CCA prox sys: 122 cm/s

## 2016-02-22 LAB — LIPID PANEL
CHOL/HDL RATIO: 5.4 ratio
CHOLESTEROL: 231 mg/dL — AB (ref 0–200)
HDL: 43 mg/dL (ref >40)
LDL Cholesterol: 116 mg/dL — ABNORMAL HIGH (ref 0–99)
TRIGLYCERIDES: 361 mg/dL — AB (ref <150)
VLDL: 72 mg/dL — ABNORMAL HIGH (ref 0–40)

## 2016-02-22 LAB — ECHOCARDIOGRAM COMPLETE
E decel time: 144 msec
FS: 31 % (ref 28–44)
HEIGHTINCHES: 63 in
IVS/LV PW RATIO, ED: 1.12
LA ID, A-P, ES: 43 mm
LA diam index: 2.06 cm/m2
LAVOLA4C: 131 mL
LEFT ATRIUM END SYS DIAM: 43 mm
MV Dec: 144
MV M vel: 118
MV pk A vel: 173 m/s
MVANNULUSVTI: 38.6 cm
MVPKEVEL: 53.2 m/s
Mean grad: 6 mmHg
PW: 13.6 mm — AB (ref 0.6–1.1)
WEIGHTICAEL: 3316.8 [oz_av]

## 2016-02-22 LAB — GLUCOSE, CAPILLARY
GLUCOSE-CAPILLARY: 119 mg/dL — AB (ref 65–99)
GLUCOSE-CAPILLARY: 271 mg/dL — AB (ref 65–99)
Glucose-Capillary: 147 mg/dL — ABNORMAL HIGH (ref 65–99)
Glucose-Capillary: 133 mg/dL — ABNORMAL HIGH (ref 65–99)

## 2016-02-22 MED ORDER — PANTOPRAZOLE SODIUM 40 MG PO TBEC
40.0000 mg | DELAYED_RELEASE_TABLET | Freq: Every day | ORAL | Status: DC
  Administered 2016-02-22 – 2016-02-24 (×3): 40 mg via ORAL
  Filled 2016-02-22 (×3): qty 1

## 2016-02-22 MED ORDER — SODIUM CHLORIDE 0.9 % IV SOLN
INTRAVENOUS | Status: DC
Start: 2016-02-22 — End: 2016-02-22
  Administered 2016-02-22: 04:00:00 via INTRAVENOUS

## 2016-02-22 MED ORDER — ACETAMINOPHEN 325 MG PO TABS
650.0000 mg | ORAL_TABLET | Freq: Once | ORAL | Status: AC
  Administered 2016-02-22: 650 mg via ORAL
  Filled 2016-02-22: qty 2

## 2016-02-22 MED ORDER — SODIUM CHLORIDE 0.9 % IV SOLN: 250.0000 mL | INTRAVENOUS | Status: DC | PRN

## 2016-02-22 MED ORDER — OXYCODONE HCL 5 MG PO TABS
5.0000 mg | ORAL_TABLET | ORAL | Status: DC | PRN
  Filled 2016-02-22: qty 1

## 2016-02-22 MED ORDER — SODIUM CHLORIDE 0.9% FLUSH
3.0000 mL | Freq: Two times a day (BID) | INTRAVENOUS | Status: DC
  Administered 2016-02-23 – 2016-02-24 (×3): 3 mL via INTRAVENOUS

## 2016-02-22 MED ORDER — STROKE: EARLY STAGES OF RECOVERY BOOK: Freq: Once | Status: DC

## 2016-02-22 MED ORDER — SODIUM CHLORIDE 0.9% FLUSH
3.0000 mL | Freq: Two times a day (BID) | INTRAVENOUS | Status: DC
  Administered 2016-02-22 – 2016-02-24 (×4): 3 mL via INTRAVENOUS

## 2016-02-22 MED ORDER — STROKE: EARLY STAGES OF RECOVERY BOOK
Freq: Once | Status: AC
  Administered 2016-02-22: 1
  Filled 2016-02-22: qty 1

## 2016-02-22 MED ORDER — PRESERVISION AREDS 2 PO CAPS: 1.0000 | ORAL_CAPSULE | Freq: Two times a day (BID) | ORAL | Status: DC

## 2016-02-22 MED ORDER — VENLAFAXINE HCL ER 75 MG PO CP24
75.0000 mg | ORAL_CAPSULE | Freq: Every day | ORAL | Status: DC
Start: 2016-02-22 — End: 2016-02-24
  Administered 2016-02-22 – 2016-02-24 (×3): 75 mg via ORAL
  Filled 2016-02-22 (×3): qty 1

## 2016-02-22 MED ORDER — ATORVASTATIN CALCIUM 10 MG PO TABS
20.0000 mg | ORAL_TABLET | Freq: Every evening | ORAL | Status: DC
  Administered 2016-02-22 – 2016-02-23 (×2): 20 mg via ORAL
  Filled 2016-02-22 (×2): qty 2

## 2016-02-22 MED ORDER — INSULIN ASPART 100 UNIT/ML ~~LOC~~ SOLN
0.0000 [IU] | Freq: Three times a day (TID) | SUBCUTANEOUS | Status: DC
  Administered 2016-02-22: 1 [IU] via SUBCUTANEOUS
  Administered 2016-02-23: 2 [IU] via SUBCUTANEOUS
  Administered 2016-02-23: 7 [IU] via SUBCUTANEOUS
  Administered 2016-02-24: 3 [IU] via SUBCUTANEOUS
  Administered 2016-02-24: 2 [IU] via SUBCUTANEOUS

## 2016-02-22 MED ORDER — SODIUM CHLORIDE 0.9% FLUSH: 3.0000 mL | INTRAVENOUS | Status: DC | PRN

## 2016-02-22 MED ORDER — ACETAMINOPHEN 325 MG PO TABS
650.0000 mg | ORAL_TABLET | Freq: Four times a day (QID) | ORAL | Status: DC | PRN
  Administered 2016-02-23: 650 mg via ORAL
  Filled 2016-02-22: qty 2

## 2016-02-22 MED ORDER — ASPIRIN 300 MG RE SUPP: 300.0000 mg | Freq: Every day | RECTAL | Status: DC

## 2016-02-22 MED ORDER — ADULT MULTIVITAMIN W/MINERALS CH
1.0000 | ORAL_TABLET | Freq: Every day | ORAL | Status: DC
  Administered 2016-02-22 – 2016-02-23 (×2): 1 via ORAL
  Filled 2016-02-22 (×2): qty 1

## 2016-02-22 MED ORDER — MAGNESIUM OXIDE 400 (241.3 MG) MG PO TABS
400.0000 mg | ORAL_TABLET | Freq: Every evening | ORAL | Status: DC
  Administered 2016-02-22 – 2016-02-23 (×2): 400 mg via ORAL
  Filled 2016-02-22 (×2): qty 1

## 2016-02-22 MED ORDER — APIXABAN 2.5 MG PO TABS
2.5000 mg | ORAL_TABLET | Freq: Two times a day (BID) | ORAL | Status: DC
  Administered 2016-02-22 – 2016-02-24 (×5): 2.5 mg via ORAL
  Filled 2016-02-22 (×5): qty 1

## 2016-02-22 MED ORDER — TRAZODONE HCL 100 MG PO TABS
100.0000 mg | ORAL_TABLET | Freq: Every day | ORAL | Status: DC
  Administered 2016-02-22 – 2016-02-23 (×2): 100 mg via ORAL
  Filled 2016-02-22 (×2): qty 1

## 2016-02-22 MED ORDER — INSULIN ASPART 100 UNIT/ML ~~LOC~~ SOLN
0.0000 [IU] | Freq: Every day | SUBCUTANEOUS | Status: DC
  Administered 2016-02-22: 3 [IU] via SUBCUTANEOUS

## 2016-02-22 MED ORDER — ASPIRIN 325 MG PO TABS
325.0000 mg | ORAL_TABLET | Freq: Every day | ORAL | Status: DC
  Administered 2016-02-23 – 2016-02-24 (×2): 325 mg via ORAL
  Filled 2016-02-22 (×2): qty 1

## 2016-02-22 MED ORDER — SODIUM CHLORIDE 0.9 % IV BOLUS (SEPSIS)
500.0000 mL | Freq: Once | INTRAVENOUS | Status: AC
  Administered 2016-02-22: 500 mL via INTRAVENOUS

## 2016-02-22 MED ORDER — ONDANSETRON HCL 4 MG/2ML IJ SOLN: 4.0000 mg | Freq: Four times a day (QID) | INTRAMUSCULAR | Status: DC | PRN

## 2016-02-22 MED ORDER — PROSIGHT PO TABS
1.0000 | ORAL_TABLET | Freq: Two times a day (BID) | ORAL | Status: DC
  Administered 2016-02-22 – 2016-02-24 (×5): 1 via ORAL
  Filled 2016-02-22 (×5): qty 1

## 2016-02-22 MED ORDER — ACETAMINOPHEN 650 MG RE SUPP: 650.0000 mg | Freq: Four times a day (QID) | RECTAL | Status: DC | PRN

## 2016-02-22 MED ORDER — METOPROLOL TARTRATE 25 MG PO TABS
25.0000 mg | ORAL_TABLET | Freq: Two times a day (BID) | ORAL | Status: DC
  Administered 2016-02-22 – 2016-02-24 (×4): 25 mg via ORAL
  Filled 2016-02-22 (×5): qty 1

## 2016-02-22 MED ORDER — ACETAMINOPHEN 500 MG PO TABS: 1000.0000 mg | ORAL_TABLET | Freq: Two times a day (BID) | ORAL | Status: DC | PRN

## 2016-02-22 MED ORDER — ONDANSETRON HCL 4 MG PO TABS: 4.0000 mg | ORAL_TABLET | Freq: Four times a day (QID) | ORAL | Status: DC | PRN

## 2016-02-22 NOTE — Progress Notes (Signed)
*  PRELIMINARY RESULTS* Vascular Ultrasound Carotid Duplex (Doppler) has been completed.  Preliminary findings: Bilateral 1-39% ICA stenosis, antegrade vertebral flow.  Tortuous common carotid and internal carotid arteries bilaterally.   Everrett Coombe 02/22/2016, 1:10 PM

## 2016-02-22 NOTE — H&P (Signed)
History and Physical    Amy Wiggins HUD:[id number] DOB: 12/11/1931 DOA: 02/21/2016  PCP: Asencion Noble, MD Patient coming from: Home  Chief Complaint: dizziness and blurry vision  HPI: Amy Wiggins is a 82 y.o. female with medical history significant of depression, diabetes, hypertension, stroke, atrial fibrillation presenting with 2 day history of blurry vision and dizziness. Denies true vertigo. Symptoms are constant but gradually improving. Denies any associated chest pain, palpitations, shortness of breath, neck stiffness, fevers, cough, abdominal pain, dysuria, frequency, diarrhea. Worse with movement. No real improvement with rest. Has not tried anything for symptoms.   ED Course: MRI noting Stroke  Review of Systems: As per HPI otherwise 10 point review of systems negative.   Ambulatory Status:limited due to symptoms  Past Medical History:  Diagnosis Date  . Depression   . Diabetes mellitus   . Hypertension   . PAF (paroxysmal atrial fibrillation) ([place])   . Stroke ([place])    TIA's x 2    Past Surgical History:  Procedure Laterality Date  . COLONOSCOPY    . COLONOSCOPY N/A 09/09/2014   Procedure: COLONOSCOPY;  Surgeon: Rogene Houston, MD;  [facility];  Service: Endoscopy;  Laterality: N/A;  1010 - moved to 7:30 - Ann to notify  . EP IMPLANTABLE DEVICE N/A 06/07/2015   Procedure: Loop Recorder Insertion;  Surgeon: Deboraha Sprang, MD;  Location: [place] [facility];  Service: Cardiovascular;  Laterality: N/A;  . TEE WITHOUT CARDIOVERSION N/A 06/07/2015   Procedure: TRANSESOPHAGEAL ECHOCARDIOGRAM (TEE);  Surgeon: Thayer Headings, MD;  Location: [facility] ENDOSCOPY;  Service: Cardiovascular;  Laterality: N/A;    Social History   Social History  . Marital status: Widowed    Spouse name: N/A  . Number of children: N/A  . Years of education: N/A   Occupational History  . Not on file.   Social History Main Topics  . Smoking status: Never Smoker  . Smokeless  tobacco: Never Used  . Alcohol use No  . Drug use: No  . Sexual activity: No   Other Topics Concern  . Not on file   Social History Narrative  . No narrative on file    No Known Allergies  Family History  Problem Relation Age of Onset  . Stroke Father   . Dementia Sister     Prior to Admission medications   Medication Sig Start Date End Date Taking? Authorizing Provider  acetaminophen (TYLENOL) 500 MG tablet Take 1,000 mg by mouth See admin instructions. Take 2 tablets (1000 mg) by mouth daily at bedtime, may also take 2 tablets during the day as needed for pain   Yes Historical Provider, MD  apixaban (ELIQUIS) 2.5 MG TABS tablet Take 1 tablet (2.5 mg total) by mouth 2 (two) times daily. 10/20/15  Yes Sherran Needs, NP  atorvastatin (LIPITOR) 20 MG tablet Take 20 mg by mouth every evening. 4pm   Yes Historical Provider, MD  chlorthalidone (HYGROTON) 25 MG tablet Take 25 mg by mouth daily. For high blood pressure/fluid 01/03/13  Yes Historical Provider, MD  glimepiride (AMARYL) 2 MG tablet Take 2 mg by mouth daily with breakfast. For Diabetes   Yes Historical Provider, MD  Liraglutide (VICTOZA) 18 MG/3ML SOPN Inject 1.8 mg into the skin at bedtime.    Yes Historical Provider, MD  losartan (COZAAR) 100 MG tablet Take 100 mg by mouth every evening. For high blood pressure - 4pm   Yes Historical Provider, MD  magnesium oxide (MAG-OX) 400  MG tablet Take 400 mg by mouth every evening. 4pm   Yes Historical Provider, MD  metoprolol tartrate (LOPRESSOR) 25 MG tablet Take 1 tablet (25 mg total) by mouth 2 (two) times daily. 01/16/16  Yes Charlynne Cousins, MD  Multiple Vitamin (MULTIVITAMIN WITH MINERALS) TABS tablet Take 1 tablet by mouth at bedtime. For supplement   Yes Historical Provider, MD  Multiple Vitamins-Minerals (PRESERVISION AREDS 2) CAPS Take 1 capsule by mouth 2 (two) times daily.   Yes Historical Provider, MD  pantoprazole (PROTONIX) 40 MG tablet Take 40 mg by mouth daily. For  acid reflux/gerd   Yes Historical Provider, MD  pioglitazone (ACTOS) 15 MG tablet Take 15 mg by mouth every evening. 4pm   Yes Historical Provider, MD  traZODone (DESYREL) 100 MG tablet Take 100 mg by mouth at bedtime.   Yes Historical Provider, MD  venlafaxine XR (EFFEXOR-XR) 75 MG 24 hr capsule Take 75 mg by mouth daily with breakfast.   Yes Historical Provider, MD  doxycycline (VIBRA-TABS) 100 MG tablet Take 1 tablet (100 mg total) by mouth every 12 (twelve) hours. Patient not taking: Reported on 02/22/2016 01/16/16   Charlynne Cousins, MD  predniSONE (DELTASONE) 10 MG tablet Takes 6 tablets for 1 days, then 5 tablets for 1 days, then 4 tablets for 1 days, then 3 tablets for 1 days, then 2 tabs for 1 days, then 1 tab for 1 days, and then stop. Patient not taking: Reported on 02/22/2016 01/16/16   Charlynne Cousins, MD    Physical Exam: Vitals:   02/22/16 0303 02/22/16 0504 02/22/16 0655 02/22/16 0948  BP: (!) 134/55 119/60 125/76 105/71  Pulse: (!) 107 100 99 94  Resp: 20 16 16    Temp: 98.6 F (37 C) 99.3 F (37.4 C) 98.2 F (36.8 C)   TempSrc: Oral Oral Oral   SpO2: 94% 96% 97%   Weight: 94 kg (207 lb 4.8 oz)     Height: 5\' 3"  (1.6 m)        General:  Appears calm and comfortable Eyes:  PERRL, EOMI, normal lids, iris ENT:  grossly normal hearing, lips & tongue, mmm Neck:  no LAD, masses or thyromegaly Cardiovascular:  RRR, no m/r/g. No LE edema.  Respiratory:  CTA bilaterally, no w/r/r. Normal respiratory effort. Abdomen:  soft, ntnd, NABS Skin:  no rash or induration seen on limited exam Musculoskeletal:  grossly normal tone BUE/BLE, good ROM, no bony abnormality Psychiatric:  grossly normal mood and affect, speech fluent and appropriate, AOx3 Neurologic: Mild gate instability, CN 2-12 grossly intact, moves all extremities in coordinated fashion, sensation intact  Labs on Admission: I have personally reviewed following labs and imaging studies  CBC:  Recent  Labs Lab 02/21/16 1911 02/21/16 1935  WBC 7.4  --   NEUTROABS 4.2  --   HGB 12.6 13.6  HCT 40.6 40.0  MCV 92.1  --   PLT 273  --    Basic Metabolic Panel:  Recent Labs Lab 02/21/16 1911 02/21/16 1935  NA 136 136  K 3.7 3.7  CL 96* 96*  CO2 29  --   GLUCOSE 152* 151*  BUN 29* 34*  CREATININE 2.20* 2.30*  CALCIUM 10.3  --    GFR: Estimated Creatinine Clearance: 20.5 mL/min (by C-G formula based on SCr of 2.3 mg/dL (H)). Liver Function Tests:  Recent Labs Lab 02/21/16 1911  AST 25  ALT 18  ALKPHOS 90  BILITOT 0.5  PROT 6.2*  ALBUMIN 3.4*  No results for input(s): LIPASE, AMYLASE in the last 168 hours. No results for input(s): AMMONIA in the last 168 hours. Coagulation Profile:  Recent Labs Lab 02/21/16 1911  INR 0.97   Cardiac Enzymes: No results for input(s): CKTOTAL, CKMB, CKMBINDEX, TROPONINI in the last 168 hours. BNP (last 3 results) No results for input(s): PROBNP in the last 8760 hours. HbA1C: No results for input(s): HGBA1C in the last 72 hours. CBG:  Recent Labs Lab 02/21/16 2240 02/22/16 0619 02/22/16 1138  GLUCAP 203* 147* 119*   Lipid Profile:  Recent Labs  02/22/16 0729  CHOL 231*  HDL 43  LDLCALC 116*  TRIG 361*  CHOLHDL 5.4   Thyroid Function Tests: No results for input(s): TSH, T4TOTAL, FREET4, T3FREE, THYROIDAB in the last 72 hours. Anemia Panel: No results for input(s): VITAMINB12, FOLATE, FERRITIN, TIBC, IRON, RETICCTPCT in the last 72 hours. Urine analysis:    Component Value Date/Time   COLORURINE YELLOW 07/05/2015 [place] 07/05/2015 1645   LABSPEC 1.025 07/05/2015 1645   PHURINE 5.0 07/05/2015 1645   GLUCOSEU NEGATIVE 07/05/2015 1645   HGBUR NEGATIVE 07/05/2015 1645   BILIRUBINUR NEGATIVE 07/05/2015 1645   KETONESUR NEGATIVE 07/05/2015 1645   PROTEINUR NEGATIVE 07/05/2015 1645   UROBILINOGEN 0.2 12/12/2010 1102   NITRITE NEGATIVE 07/05/2015 1645   LEUKOCYTESUR NEGATIVE 07/05/2015 1645     Creatinine Clearance: Estimated Creatinine Clearance: 20.5 mL/min (by C-G formula based on SCr of 2.3 mg/dL (H)).  Sepsis Labs: @LABRCNTIP (procalcitonin:4,lacticidven:4) )No results found for this or any previous visit (from the past 240 hour(s)).   Radiological Exams on Admission: Ct Head Wo Contrast  Result Date: 02/21/2016 CLINICAL DATA:  Stroke-like symptoms for more than 8 hours. Blurred vision in the left eye since yesterday. EXAM: CT HEAD WITHOUT CONTRAST TECHNIQUE: Contiguous axial images were obtained from the base of the skull through the vertex without intravenous contrast. COMPARISON:  MRI brain 06/02/2015.  CT head 06/02/2015. FINDINGS: Brain: There is no evidence of acute intracranial hemorrhage, mass lesion, brain edema or extra-axial fluid collection. The ventricles and subarachnoid spaces are appropriately sized for age. There is no CT evidence of acute cortical infarction. There is encephalomalacia within the right occipital lobe at the site of the previously demonstrated acute infarct. Chronic infarcts are present in the posterior right parietal lobe and left cerebellum. Vascular: Intracranial vascular calcifications. No hyperdense vascular sign. Skull: Negative for fracture or focal lesion. Sinuses/Orbits: The visualized paranasal sinuses and mastoid air cells are clear. No orbital abnormalities are seen. Other: None. IMPRESSION: 1. No acute intracranial findings. 2. Expected evolution of previously demonstrated right occipital stroke Electronically Signed   By: Richardean Sale M.D.   On: 02/21/2016 20:04   Mr Brain Wo Contrast  Result Date: 02/22/2016 CLINICAL DATA:  Dizziness with pain behind eyes.  Blurred vision. EXAM: MRI HEAD WITHOUT CONTRAST TECHNIQUE: Multiplanar, multiecho pulse sequences of the brain and surrounding structures were obtained without intravenous contrast. COMPARISON:  Head CT 02/21/2016 FINDINGS: Brain: There is focal diffusion restriction within the  left occipital lobe and right cerebellum. There is multifocal hyperintense T2-weighted signal within the periventricular white matter, most often seen in the setting of chronic microvascular ischemia. There is an old right occipital lobe infarct. No mass lesion or midline shift. No hydrocephalus or extra-axial fluid collection. The midline structures are normal. No age advanced or lobar predominant atrophy. Vascular: Major intracranial arterial and venous sinus flow voids are preserved. No evidence of chronic microhemorrhage or amyloid angiopathy. Skull and  upper cervical spine: The visualized skull base, calvarium, upper cervical spine and extracranial soft tissues are normal. Sinuses/Orbits: No fluid levels or advanced mucosal thickening. No mastoid effusion. Normal orbits. IMPRESSION: 1. Acute ischemia within the left occipital lobe and within the right cerebellum. Multiple vascular territory involvement suggests a central embolic process. 2. No hemorrhage, midline shift or mass effect. 3. Old right occipital lobe infarct and chronic microvascular ischemia. Electronically Signed   By: Ulyses Jarred M.D.   On: 02/22/2016 01:29    EKG: Pending  Assessment/Plan Active Problems:   Essential hypertension   HLD (hyperlipidemia)   Type 2 diabetes mellitus with circulatory disorder (HCC)   Paroxysmal atrial fibrillation (HCC)   Stroke (HCC)   Ischemic stroke (HCC)   Chronic diastolic congestive heart failure (HCC)   AKI (acute kidney injury) ([place])   Stroke: MRI showing acute ischemic left occipital lobe and right cerebellar strokes. Multiple areas of involvement concerning for embolic etiology. - MRA head - Carrotid dopplers - Echo - PT/OT - A1c - ASA, lipitor - Neuro to see pt.   Afib: rate controlled - Continue eliquis, [place],   AOCKD: Creatinine 2.3. Baseline 1.5.  - hold Losartan, Chlorthalidone - IVF - BMP in am  GERD: - continue ppi  Insomnia: - continue trazodone  DM: -  SSI  Depression: - continue effexor   Chronic diastolic CHF: EF 16% w/ grade 1 diastolic dysfunction - daily wts. Strict I/O  DVT prophylaxis: Eiquis  Code Status: full  Family Communication: none  Disposition Plan: pending neuro evaluation and workup completion  Consults called: neuro  Admission status: inpt    Lilliahna Schubring J MD [facility]  If 7PM-7AM, please contact night-coverage [email or web address] Password [email or web address]  02/22/2016, 12:12 PM

## 2016-02-22 NOTE — Progress Notes (Signed)
Pending admission per PA, Tiffany  82 year old lady with past medical history for stroke, hypertension, hyperlipidemia, diabetes mellitus, GERD, vertigo, atrial fibrillation on decrease, CKD-III, who presents with blurred vision for 2 days, also has dizziness. No unilateral weakness. MRI showed acute ischemia within the left occipital lobe and within the right cerebellum and  Multiple vascular territory involvement suggesting central embolic process. Pt is accepted to tele bed for obs. Neurology, Dr. Nicole Kindred was consulted.  Ivor Costa, MD  [facility] Pager [phone]  If 7PM-7AM, please contact night-coverage [email or web address] Password [email or web address] 02/22/2016, 2:52 AM

## 2016-02-22 NOTE — Evaluation (Signed)
Occupational Therapy Evaluation Patient Details Name: Amy Wiggins MRN: [id number] DOB: 10-31-1931 Today's Date: 02/22/2016    History of Present Illness Pt is an 82 y/o female who presents with dizziness and blurred vision. MRI revealed acute ischemia within the L occipital lobe and R cerebellum.    Clinical Impression   Pt was independent prior to admission in self care. Her granddaughter does the housekeeping and cooking. Pt performed bathing, UB dressing and grooming with supervision for safety when standing. She reports her blurred vision improving, does not have reading glasses available. Recommending HHOT for safety evaluation at home.     Follow Up Recommendations  Home Health OT    Equipment Recommendations  None recommended by OT    Recommendations for Other Services       Precautions / Restrictions Precautions Precautions: Fall Restrictions Weight Bearing Restrictions: No      Mobility Bed Mobility Overal bed mobility: Modified Independent   Transfers Overall transfer level: Needs assistance Equipment used: None Transfers: Sit to/from Stand Sit to Stand: Supervision         General transfer comment: supervision for safety    Balance Overall balance assessment: Needs assistance Sitting-balance support: Feet supported;No upper extremity supported Sitting balance-Leahy Scale: Good     Standing balance support: No upper extremity supported;During functional activity Standing balance-Leahy Scale: Fair Standing balance comment: held on to bed as she performed pericare in standing                         ADL                                         General ADL Comments: Pt performed sponge bath, dressing and grooming in standing at the sink with supervision for safety.     Vision Additional Comments: pt does not have reading glasses available, pt able to see large print and navigated environment with supervision  for safety.   Perception     Praxis      Pertinent Vitals/Pain Pain Assessment: No/denies pain     Hand Dominance Right   Extremity/Trunk Assessment Upper Extremity Assessment Upper Extremity Assessment: Overall WFL for tasks assessed   Lower Extremity Assessment Lower Extremity Assessment: Defer to PT evaluation   Cervical / Trunk Assessment Cervical / Trunk Assessment: Kyphotic   Communication Communication Communication: No difficulties   Cognition Arousal/Alertness: Awake/alert Behavior During Therapy: WFL for tasks assessed/performed Overall Cognitive Status: Within Functional Limits for tasks assessed                     General Comments       Exercises       Shoulder Instructions      Home Living Family/patient expects to be discharged to:: Private residence Living Arrangements: Other relatives (granddaughter) Available Help at Discharge: Family;Available PRN/intermittently Type of Home: House Home Access: Ramped entrance     Home Layout: Two level;Able to live on main level with bedroom/bathroom     Bathroom Shower/Tub: Occupational psychologist: Handicapped height Bathroom Accessibility: Yes   Home Equipment: Shower seat - built in;Walker - 2 wheels;Cane - single point;Grab bars - tub/shower          Prior Functioning/Environment Level of Independence: Independent        Comments: Driving and shopping independently. POA for sister  who has dementia at lives at [facility]        OT Problem List: Impaired balance (sitting and/or standing);Impaired vision/perception   OT Treatment/Interventions: Self-care/ADL training;Patient/family education;DME and/or AE instruction;Therapeutic activities    OT Goals(Current goals can be found in the care plan section) Acute Rehab OT Goals Patient Stated Goal: Home at d/c OT Goal Formulation: With patient Time For Goal Achievement: 02/29/16 Potential to Achieve Goals: Good ADL  Goals Pt Will Perform Grooming: with modified independence;standing Pt Will Perform Lower Body Bathing: with modified independence;sit to/from stand Pt Will Perform Lower Body Dressing: with modified independence;sit to/from stand Pt Will Transfer to Toilet: with modified independence;ambulating Pt Will Perform Toileting - Clothing Manipulation and hygiene: with modified independence;sit to/from stand Pt Will Perform Tub/Shower Transfer: Shower transfer;with modified independence;ambulating  OT Frequency: Min 2X/week   Barriers to D/C:            Co-evaluation              End of Session Equipment Utilized During Treatment: Gait belt Nurse Communication: Mobility status  Activity Tolerance: Patient tolerated treatment well Patient left: in chair;with call bell/phone within reach   Time: 1002-1032 OT Time Calculation (min): 30 min Charges:  OT General Charges $OT Visit: 1 Procedure OT Evaluation $OT Eval Moderate Complexity: 1 Procedure OT Treatments $Self Care/Home Management : 8-22 mins G-Codes: OT G-codes **NOT FOR INPATIENT CLASS** Functional Assessment Tool Used: clinical judgement Functional Limitation: Self care Self Care Current Status (H5456): At least 1 percent but less than 20 percent impaired, limited or restricted Self Care Goal Status (Y5638): At least 1 percent but less than 20 percent impaired, limited or restricted  Malka So 02/22/2016, 10:41 AM

## 2016-02-22 NOTE — Progress Notes (Signed)
  Echocardiogram 2D Echocardiogram has been performed.  Jennette Dubin 02/22/2016, 1:44 PM

## 2016-02-22 NOTE — Progress Notes (Signed)
Patient arrived from [facility] to 5M09. Patient ambulated from stretcher to bed with standby assist. Vital signs taken and stable. Telemetry monitor applied. Patient oriented to room, phone and call-bell. Will continue to monitor.

## 2016-02-22 NOTE — ED Notes (Addendum)
MD at bedside. Notified of pt's headache.

## 2016-02-22 NOTE — Consult Note (Signed)
Referring Physician: Dr. Marily Memos    Chief Complaint: Blurred vision and dizziness.  HPI: Amy Wiggins is an 82 y.o. female patient of Dr. Knute Neu in [place] with a history of paroxysmal atrial fibrillation on Eliquis, S/P loop recorder implant 02/17/2016, a previous stroke in April 2017, TEE 06/07/15 with PFO, hypertension, diabetes mellitus, and depression. The patient sees Dr. Erlinda Hong and has a follow-up appointment scheduled in the near future. Approximately 2 days ago she awoke with dizziness and visual problems. This improved somewhat during the day but never resolved. She noted that the visual changes appear to affect mainly her left eye which is the same eye affected by her stroke in April. She eventually presented to [facility] on 02/21/2016 where an MRI revealed acute ischemia within the left occipital lobe and within the right cerebellum. Multiple vascular territory involvement suggested a central embolic process. The patient denies missing any doses of her medications.  Date last known well: Unable to determine Time last known well: Unable to determine tPA Given: No: The patient was out of the window for therapy at time of presentation. She was also already anticoagulated.  Past Medical History:  Diagnosis Date  . Depression   . Diabetes mellitus   . Hypertension   . PAF (paroxysmal atrial fibrillation) ([place])   . Stroke ([place])    TIA's x 2    Past Surgical History:  Procedure Laterality Date  . COLONOSCOPY    . COLONOSCOPY N/A 09/09/2014   Procedure: COLONOSCOPY;  Surgeon: Rogene Houston, MD;  Location: [facility];  Service: Endoscopy;  Laterality: N/A;  1010 - moved to 7:30 - Ann to notify  . EP IMPLANTABLE DEVICE N/A 06/07/2015   Procedure: Loop Recorder Insertion;  Surgeon: Deboraha Sprang, MD;  Location: [place] CV LAB;  Service: Cardiovascular;  Laterality: N/A;  . TEE WITHOUT CARDIOVERSION N/A 06/07/2015   Procedure: TRANSESOPHAGEAL ECHOCARDIOGRAM (TEE);   Surgeon: Thayer Headings, MD;  Location: [facility] ENDOSCOPY;  Service: Cardiovascular;  Laterality: N/A;    Family History  Problem Relation Age of Onset  . Stroke Father   . Dementia Sister    Social History:  reports that she has never smoked. She has never used smokeless tobacco. She reports that she does not drink alcohol or use drugs.  Allergies: No Known Allergies  Medications:  Scheduled: .  stroke: mapping our early stages of recovery book   Does not apply Once  . apixaban  2.5 mg Oral BID  . aspirin  300 mg Rectal Daily   Or  . aspirin  325 mg Oral Daily  . atorvastatin  20 mg Oral QPM  . insulin aspart  0-5 Units Subcutaneous QHS  . insulin aspart  0-9 Units Subcutaneous TID WC  . magnesium oxide  400 mg Oral QPM  . metoprolol tartrate  25 mg Oral BID  . multivitamin  1 tablet Oral BID  . multivitamin with minerals  1 tablet Oral QHS  . pantoprazole  40 mg Oral Daily  . sodium chloride  500 mL Intravenous Once  . sodium chloride flush  3 mL Intravenous Q12H  . sodium chloride flush  3 mL Intravenous Q12H  . traZODone  100 mg Oral QHS  . venlafaxine XR  75 mg Oral Q breakfast    ROS: History obtained from the patient  General ROS: negative for - chills, fatigue, fever, night sweats, weight gain or weight loss Psychological ROS: negative for - behavioral disorder, hallucinations, memory  difficulties, mood swings or suicidal ideation Ophthalmic ROS: negative for - blurry vision, double vision, eye pain or loss of vision ENT ROS: negative for - epistaxis, nasal discharge, oral lesions, sore throat, tinnitus or vertigo Allergy and Immunology ROS: negative for - hives or itchy/watery eyes Hematological and Lymphatic ROS: negative for - bleeding problems, bruising or swollen lymph nodes Endocrine ROS: negative for - galactorrhea, hair pattern changes, polydipsia/polyuria or temperature intolerance Respiratory ROS: The patient was recently treated for pneumonia however she  feels this has resolved. Cardiovascular ROS: negative for - chest pain, dyspnea on exertion, edema or irregular heartbeat Gastrointestinal ROS: negative for - abdominal pain, diarrhea, hematemesis, nausea/vomiting or stool incontinence Genito-Urinary ROS: negative for - dysuria, hematuria, incontinence or urinary frequency/urgency Musculoskeletal ROS: negative for - joint swelling or muscular weakness Neurological ROS: as noted in HPI Dermatological ROS: negative for rash and skin lesion changes   Physical Examination: Blood pressure 105/71, pulse 94, temperature 98.2 F (36.8 C), temperature source Oral, resp. rate 16, height 5\' 3"  (1.6 m), weight 94 kg (207 lb 4.8 oz), SpO2 97 %.  General - pleasant 82 year old female in no acute distress. Heart - Regular rate and rhythm - no murmur appreciated. Lungs - Clear to auscultation Abdomen - Soft - non tender Extremities - Distal pulses intact - no edema Skin - Warm and dry  Mental Status: Alert, oriented, thought content appropriate.  Speech fluent without evidence of aphasia.  Able to follow 3 step commands without difficulty. Cranial Nerves: II: Discs not visualized, pupils equal, round, reactive to light and accommodation. Left inferior bilateral quadrantanopia. III,IV, VI: ptosis not present, extra-ocular motions intact bilaterally V,VII: smile symmetric, facial light touch sensation normal bilaterally VIII: hearing normal bilaterally IX,X: gag reflex present XI: bilateral shoulder shrug XII: midline tongue extension Motor: Right : Upper extremity   5/5    Left:     Upper extremity   5/5  Lower extremity   5/5     Lower extremity   5/5 Tone and bulk:normal tone throughout; no atrophy noted Sensory: Pinprick and light touch intact throughout, bilaterally. Decreased sensation to light touch in both lower extremities secondary to diabetic peripheral neuropathy. Deep Tendon Reflexes: 2+ and symmetric throughout Plantars: Right:  downgoing   Left: Equivocal Cerebellar: normal finger-to-nose, normal rapid alternating movements and normal heel-to-shin test Gait: Not tested CV: pulses palpable throughout   Laboratory Studies:  Basic Metabolic Panel:  Recent Labs Lab 02/21/16 1911 02/21/16 1935  NA 136 136  K 3.7 3.7  CL 96* 96*  CO2 29  --   GLUCOSE 152* 151*  BUN 29* 34*  CREATININE 2.20* 2.30*  CALCIUM 10.3  --     Liver Function Tests:  Recent Labs Lab 02/21/16 1911  AST 25  ALT 18  ALKPHOS 90  BILITOT 0.5  PROT 6.2*  ALBUMIN 3.4*   No results for input(s): LIPASE, AMYLASE in the last 168 hours. No results for input(s): AMMONIA in the last 168 hours.  CBC:  Recent Labs Lab 02/21/16 1911 02/21/16 1935  WBC 7.4  --   NEUTROABS 4.2  --   HGB 12.6 13.6  HCT 40.6 40.0  MCV 92.1  --   PLT 273  --     Cardiac Enzymes: No results for input(s): CKTOTAL, CKMB, CKMBINDEX, TROPONINI in the last 168 hours.  BNP: Invalid input(s): POCBNP  CBG:  Recent Labs Lab 02/21/16 2240 02/22/16 0619 02/22/16 1138  GLUCAP 203* 147* 119*    Microbiology: Results for  orders placed or performed during the hospital encounter of 01/14/16  Blood culture (routine x 2)     Status: None   Collection Time: 01/14/16  6:15 PM  Result Value Ref Range Status   Specimen Description BLOOD RIGHT ANTECUBITAL  Final   Special Requests BOTTLES DRAWN AEROBIC AND ANAEROBIC 5CC  Final   Culture NO GROWTH 5 DAYS  Final   Report Status 01/19/2016 FINAL  Final  Blood culture (routine x 2)     Status: None   Collection Time: 01/14/16  6:20 PM  Result Value Ref Range Status   Specimen Description BLOOD LEFT WRIST  Final   Special Requests BOTTLES DRAWN AEROBIC AND ANAEROBIC 5CC  Final   Culture NO GROWTH 5 DAYS  Final   Report Status 01/19/2016 FINAL  Final  Respiratory Panel by PCR     Status: Abnormal   Collection Time: 01/15/16 12:01 AM  Result Value Ref Range Status   Adenovirus NOT DETECTED NOT DETECTED  Final   Coronavirus 229E NOT DETECTED NOT DETECTED Final   Coronavirus HKU1 NOT DETECTED NOT DETECTED Final   Coronavirus NL63 NOT DETECTED NOT DETECTED Final   Coronavirus OC43 NOT DETECTED NOT DETECTED Final   Metapneumovirus NOT DETECTED NOT DETECTED Final   Rhinovirus / Enterovirus NOT DETECTED NOT DETECTED Final   Influenza A NOT DETECTED NOT DETECTED Final   Influenza B NOT DETECTED NOT DETECTED Final   Parainfluenza Virus 1 NOT DETECTED NOT DETECTED Final   Parainfluenza Virus 2 NOT DETECTED NOT DETECTED Final   Parainfluenza Virus 3 NOT DETECTED NOT DETECTED Final   Parainfluenza Virus 4 NOT DETECTED NOT DETECTED Final   Respiratory Syncytial Virus DETECTED (A) NOT DETECTED Final    Comment: CRITICAL RESULT CALLED TO, READ BACK BY AND VERIFIED WITH: L. Moffitt RN 11:30 01/15/16 ([email or web address])    Bordetella pertussis NOT DETECTED NOT DETECTED Final   Chlamydophila pneumoniae NOT DETECTED NOT DETECTED Final   Mycoplasma pneumoniae NOT DETECTED NOT DETECTED Final    Coagulation Studies:  Recent Labs  02/21/16 1911  LABPROT 12.9  INR 0.97    Urinalysis: No results for input(s): COLORURINE, LABSPEC, PHURINE, GLUCOSEU, HGBUR, BILIRUBINUR, KETONESUR, PROTEINUR, UROBILINOGEN, NITRITE, LEUKOCYTESUR in the last 168 hours.  Invalid input(s): APPERANCEUR  Lipid Panel:    Component Value Date/Time   CHOL 231 (H) 02/22/2016 0729   TRIG 361 (H) 02/22/2016 0729   HDL 43 02/22/2016 0729   CHOLHDL 5.4 02/22/2016 0729   VLDL 72 (H) 02/22/2016 0729   LDLCALC 116 (H) 02/22/2016 0729    HgbA1C:  Lab Results  Component Value Date   HGBA1C 7.7 (H) 06/03/2015    Urine Drug Screen:  No results found for: LABOPIA, COCAINSCRNUR, LABBENZ, AMPHETMU, THCU, LABBARB  Alcohol Level: No results for input(s): ETH in the last 168 hours.  Other results: EKG: Sinus tachycardia rate 125 BPM with occasional Premature ventricular complexes and left anterior fascicular block. Refer to formal  cardiology reading for complete details.  Imaging:  Ct Head Wo Contrast 02/21/2016 1. No acute intracranial findings.  2. Expected evolution of previously demonstrated right occipital stroke    Mr Brain Wo Contrast 02/22/2016 1. Acute ischemia within the left posterior parietal lobe and within the right cerebellum. Multiple vascular territory involvement suggests a central embolic process.  2. No hemorrhage, midline shift or mass effect.  3. Old right occipital lobe infarct and chronic microvascular ischemia.   TEE 06/07/2015 Study Conclusions - Left ventricle: The cavity size was normal. Wall thickness  was   normal. Systolic function was normal. - Aortic valve: No evidence of vegetation. - Mitral valve: Mildly to moderately calcified annulus. No evidence   of vegetation. There was mild regurgitation. - Left atrium: No evidence of thrombus in the atrial cavity or   appendage. Atrial septum:  There is evidence of left to right shunting across the PFO by color flow and evidence of right to left shunting across the PFO by bubble study . There was a large atrial septal aneurysm.  Assessment: 82 y.o. female with a history of paroxysmal atrial fibrillation on Eliquis, S/P loop recorder implant 02/17/2016, a previous stroke in April 2017, TEE 06/07/15 with PFO, hypertension, diabetes mellitus, and depressionwho presents with dizziness and visual difficulties. She follows with Dr. Erlinda Hong and has an appointment to see him in the near future. She denies missing any doses of her Eliquis. She is classifiable as having failed Eliquis.   Stroke Risk Factors - PAF, hypertension, previous stroke, previous TIAs, diabetes mellitus, obesity, and family history.  Recommendations:  Consider switching Eliquis to an alternate anticoagulant of a different class. Coumadin and Dabigatran are potential alternatives. The literature also describes addition of ASA to anticoagulation when there is recurrent stroke  on direct factor Xa inhibitors.   MRI / MRA Head / Brain Wo Contrast  Carotid Dopplers (if not performed in the past 6 months)  Transthoracic Echocardiogram   Fasting lipids  Hemoglobin A1c  Urine drug screen  Telemetry monitoring  Frequent neuro checks  Permissive hypertension (OK if < 220/120) but gradually normalize in 5-7 day.   Long-term BP goal normotensive  VTE prophylaxis  Physical, occupational therapy, and speech evaluations.   NPO until RN stroke swallow screen  Risk Facter Modification  Increase atorvastatin to 40 mg po qd. Obtain CK level and LFTs.   Ultrasound of lower extremities to assess for possible occult DVT. Given her PFO seen on prior TEE, paradoxical embolization is a possible etiology for her stroke.    Amy Bussing PA-C [facility] Pager [phone] 02/22/2016, 3:22 PM  Electronically signed: Dr. Kerney Elbe

## 2016-02-22 NOTE — Evaluation (Signed)
Physical Therapy Evaluation Patient Details Name: Amy Wiggins MRN: [id number] DOB: 1931-07-10 Today's Date: 02/22/2016   History of Present Illness  Pt is an 82 y/o female who presents with dizziness and blurred vision. MRI revealed acute ischemia within the L occipital lobe and R cerebellum.   Clinical Impression  Pt admitted with above diagnosis. Pt currently with functional limitations due to the deficits listed below (see PT Problem List). At the time of PT eval pt was able to perform transfers and ambulation with supervision to min guard assist for safety. Min assist provided for higher level balance activity. Lives with college-aged granddaughter who is on school break at this time and can provide assist if needed. Pt will benefit from skilled PT to increase their independence and safety with mobility to allow discharge to the venue listed below.      Follow Up Recommendations Home health PT;Supervision/Assistance - 24 hour (initially - pt reports granddaughter will be there until 1/8)    Equipment Recommendations  None recommended by PT    Recommendations for Other Services       Precautions / Restrictions Precautions Precautions: Fall Restrictions Weight Bearing Restrictions: No      Mobility  Bed Mobility Overal bed mobility: Needs Assistance Bed Mobility: Supine to Sit     Supine to sit: Supervision     General bed mobility comments: Supervision for safety as pt transitioned to EOB. HOB slightly elevated and rails utilized for support.   Transfers Overall transfer level: Needs assistance Equipment used: None Transfers: Sit to/from Stand Sit to Stand: Supervision         General transfer comment: Supervision for safety as pt powered-up to full standing position. Increased time required initially as pt appeared guarded. In subsequent attempts later in session no increased time was needed.   Ambulation/Gait Ambulation/Gait assistance: Min  guard Ambulation Distance (Feet): 200 Feet Assistive device: None Gait Pattern/deviations: Step-through pattern;Decreased stride length;Trunk flexed Gait velocity: Decreased Gait velocity interpretation: Below normal speed for age/gender General Gait Details: Slow and guarded initially with increase in gait speed and confidence as gait training progressed. No obvious LOB noted with normal gait, however min assist provided for higher level balance activity.   Stairs            Wheelchair Mobility    Modified Rankin (Stroke Patients Only) Modified Rankin (Stroke Patients Only) Pre-Morbid Rankin Score: No symptoms Modified Rankin: No significant disability     Balance Overall balance assessment: Needs assistance Sitting-balance support: Feet supported;No upper extremity supported Sitting balance-Leahy Scale: Good     Standing balance support: No upper extremity supported;During functional activity Standing balance-Leahy Scale: Fair Standing balance comment: Was able to stand and clean herself without assist or noted unsteadiness after toileting.              High level balance activites: Turns;Direction changes;Sudden stops;Other (comment) (stepping over objects) High Level Balance Comments: Min assist at times             Pertinent Vitals/Pain Pain Assessment: No/denies pain    Home Living Family/patient expects to be discharged to:: Private residence Living Arrangements: Other relatives (granddaughter) Available Help at Discharge: Family;Available PRN/intermittently Type of Home: House Home Access: Ramped entrance     Home Layout: Two level;Able to live on main level with bedroom/bathroom Home Equipment: Shower seat - built in;Walker - 2 wheels;Cane - single point;Grab bars - tub/shower      Prior Function Level of Independence: Independent  Comments: Driving and shopping independently. POA for sister who has dementia at lives at [place]: Right    Extremity/Trunk Assessment   Upper Extremity Assessment Upper Extremity Assessment: Defer to OT evaluation    Lower Extremity Assessment Lower Extremity Assessment: Overall WFL for tasks assessed (Reports tingling initially in B feet but has since resolved)    Cervical / Trunk Assessment Cervical / Trunk Assessment: Kyphotic  Communication   Communication: No difficulties  Cognition Arousal/Alertness: Awake/alert Behavior During Therapy: WFL for tasks assessed/performed Overall Cognitive Status: Within Functional Limits for tasks assessed                      General Comments      Exercises     Assessment/Plan    PT Assessment Patient needs continued PT services  PT Problem List Decreased strength;Decreased range of motion;Decreased activity tolerance;Decreased balance;Decreased mobility;Decreased knowledge of use of DME;Decreased knowledge of precautions;Decreased safety awareness          PT Treatment Interventions DME instruction;Gait training;Stair training;Functional mobility training;Therapeutic activities;Therapeutic exercise;Neuromuscular re-education;Patient/family education    PT Goals (Current goals can be found in the Care Plan section)  Acute Rehab PT Goals Patient Stated Goal: Home at d/c PT Goal Formulation: With patient Time For Goal Achievement: 02/29/16 Potential to Achieve Goals: Good    Frequency Min 3X/week   Barriers to discharge        Co-evaluation               End of Session Equipment Utilized During Treatment: Gait belt Activity Tolerance: Patient tolerated treatment well Patient left: in chair;with call bell/phone within reach;with chair alarm set Nurse Communication: Mobility status    Functional Assessment Tool Used: Clinical judgement Functional Limitation: Mobility: Walking and moving around Mobility: Walking and Moving Around Current Status [phone]): At least 1  percent but less than 20 percent impaired, limited or restricted Mobility: Walking and Moving Around Goal Status [phone]): At least 1 percent but less than 20 percent impaired, limited or restricted    Time: 0802-0830 PT Time Calculation (min) (ACUTE ONLY): 28 min   Charges:   PT Evaluation $PT Eval Low Complexity: 1 Procedure PT Treatments $Gait Training: 8-22 mins   PT G Codes:   PT G-Codes **NOT FOR INPATIENT CLASS** Functional Assessment Tool Used: Clinical judgement Functional Limitation: Mobility: Walking and moving around Mobility: Walking and Moving Around Current Status ([id number]): At least 1 percent but less than 20 percent impaired, limited or restricted Mobility: Walking and Moving Around Goal Status [phone]): At least 1 percent but less than 20 percent impaired, limited or restricted    Thelma Comp 02/22/2016, 8:41 AM   Rolinda Roan, PT, DPT Acute Rehabilitation Services Pager: [phone]

## 2016-02-22 NOTE — Care Management Note (Signed)
Case Management Note  Patient Details  Name: Amy Wiggins MRN: [id number] Date of Birth: 05/31/1931  Subjective/Objective:   Pt admitted with CVA. She is from home with her granddaughter.                  Action/Plan: PT/OT recommendations are for [facility] services. CM following for d/c needs.   Expected Discharge Date:                  Expected Discharge Plan:  [place]  In-House Referral:     Discharge planning Services     Post Acute Care Choice:    Choice offered to:     DME Arranged:    DME Agency:     HH Arranged:    [place] Agency:     Status of Service:  In process, will continue to follow  If discussed at Long Length of Stay Meetings, dates discussed:    Additional Comments:  Pollie Friar, RN 02/22/2016, 11:59 AM

## 2016-02-22 NOTE — ED Notes (Signed)
Patient transported to MRI 

## 2016-02-22 NOTE — Progress Notes (Deleted)
Patient arrived from [facility] to 5M09. Patient ambulated from stretcher to bed with standby assist. Vital signs taken and stable. Telemetry monitor applied. Patient oriented to room, phone and call-bell. Will continue to monitor.

## 2016-02-23 ENCOUNTER — Other Ambulatory Visit (HOSPITAL_COMMUNITY): Payer: Medicare Other

## 2016-02-23 LAB — GLUCOSE, CAPILLARY
GLUCOSE-CAPILLARY: 143 mg/dL — AB (ref 65–99)
Glucose-Capillary: 168 mg/dL — ABNORMAL HIGH (ref 65–99)
Glucose-Capillary: 214 mg/dL — ABNORMAL HIGH (ref 65–99)
Glucose-Capillary: 332 mg/dL — ABNORMAL HIGH (ref 65–99)

## 2016-02-23 LAB — BASIC METABOLIC PANEL
Anion gap: 9 (ref 5–15)
BUN: 25 mg/dL — ABNORMAL HIGH (ref 6–20)
CALCIUM: 9.9 mg/dL (ref 8.9–10.3)
CO2: 31 mmol/L (ref 22–32)
CREATININE: 1.48 mg/dL — AB (ref 0.44–1.00)
Chloride: 100 mmol/L — ABNORMAL LOW (ref 101–111)
GFR, EST AFRICAN AMERICAN: 37 mL/min — AB (ref >60)
GFR, EST NON AFRICAN AMERICAN: 32 mL/min — AB (ref >60)
Glucose, Bld: 178 mg/dL — ABNORMAL HIGH (ref 65–99)
Potassium: 3.4 mmol/L — ABNORMAL LOW (ref 3.5–5.1)
SODIUM: 140 mmol/L (ref 135–145)

## 2016-02-23 LAB — CBC
HCT: 38.5 % (ref 36.0–46.0)
Hemoglobin: 12.1 g/dL (ref 12.0–15.0)
MCH: 28.7 pg (ref 26.0–34.0)
MCHC: 31.4 g/dL (ref 30.0–36.0)
MCV: 91.4 fL (ref 78.0–100.0)
PLATELETS: 260 10*3/uL (ref 150–400)
RBC: 4.21 MIL/uL (ref 3.87–5.11)
RDW: 14.8 % (ref 11.5–15.5)
WBC: 6.4 10*3/uL (ref 4.0–10.5)

## 2016-02-23 LAB — HEMOGLOBIN A1C
HEMOGLOBIN A1C: 8.8 % — AB (ref 4.8–5.6)
Hgb A1c MFr Bld: 8.8 % — ABNORMAL HIGH (ref 4.8–5.6)
MEAN PLASMA GLUCOSE: 206 mg/dL
Mean Plasma Glucose: 206 mg/dL

## 2016-02-23 NOTE — Progress Notes (Signed)
STROKE TEAM PROGRESS NOTE   HISTORY OF PRESENT ILLNESS (per record) Amy Wiggins is an 82 y.o. female patient of Dr. Knute Neu in [place] with a history of paroxysmal atrial fibrillation on Eliquis, S/P loop recorder implant 02/17/2016, a previous stroke in April 2017, TEE 06/07/15 with PFO, hypertension, diabetes mellitus, and depression. The patient sees Dr. Erlinda Hong and has a follow-up appointment scheduled in the near future. Approximately 2 days ago she awoke with dizziness and visual problems. This improved somewhat during the day but never resolved. She noted that the visual changes appear to affect mainly her left eye which is the same eye affected by her stroke in April. She eventually presented to [facility] on 02/21/2016 where an MRI revealed acute ischemia within the left occipital lobe and within the right cerebellum. Multiple vascular territory involvement suggested a central embolic process. The patient denies missing any doses of her medications. Her last known well was unable to be determined. Patient was not administered IV t-PA secondary to being on Eliquis as well as being out of the window. She was admitted for further evaluation and treatment.   SUBJECTIVE (INTERVAL HISTORY) No family is at the bedside.  Overall she feels her condition is stable. She did not realize she had lost some vision with the stroke. She was advised not to drive. She prefers to stay on Eliquis.   OBJECTIVE Temp:  [97.7 F (36.5 C)-98.6 F (37 C)] 98.4 F (36.9 C) (12/29 1013) Pulse Rate:  [82-108] 97 (12/29 1013) Cardiac Rhythm: Sinus tachycardia (12/29 0800) Resp:  [17-20] 18 (12/29 1013) BP: (114-148)/(61-95) 114/62 (12/29 1013) SpO2:  [94 %-99 %] 94 % (12/29 1013) Weight:  [94.2 kg (207 lb 10.8 oz)] 94.2 kg (207 lb 10.8 oz) (12/29 0532)  CBC:  Recent Labs Lab 02/21/16 1911 02/21/16 1935 02/23/16 0648  WBC 7.4  --  6.4  NEUTROABS 4.2  --   --   HGB 12.6 13.6 12.1  HCT 40.6  40.0 38.5  MCV 92.1  --  91.4  PLT 273  --  540    Basic Metabolic Panel:  Recent Labs Lab 02/21/16 1911 02/21/16 1935 02/23/16 0648  NA 136 136 140  K 3.7 3.7 3.4*  CL 96* 96* 100*  CO2 29  --  31  GLUCOSE 152* 151* 178*  BUN 29* 34* 25*  CREATININE 2.20* 2.30* 1.48*  CALCIUM 10.3  --  9.9    Lipid Panel:    Component Value Date/Time   CHOL 231 (H) 02/22/2016 0729   TRIG 361 (H) 02/22/2016 0729   HDL 43 02/22/2016 0729   CHOLHDL 5.4 02/22/2016 0729   VLDL 72 (H) 02/22/2016 0729   LDLCALC 116 (H) 02/22/2016 0729   HgbA1c:  Lab Results  Component Value Date   HGBA1C 8.8 (H) 02/22/2016   Urine Drug Screen: No results found for: LABOPIA, COCAINSCRNUR, LABBENZ, AMPHETMU, THCU, LABBARB    IMAGING  Ct Head Wo Contrast 02/21/2016 1. No acute intracranial findings. 2. Expected evolution of previously demonstrated right occipital stroke   Mr Brain Wo Contrast 02/22/2016 1. Acute ischemia within the left occipital lobe and within the right cerebellum. Multiple vascular territory involvement suggests a central embolic process. 2. No hemorrhage, midline shift or mass effect. 3. Old right occipital lobe infarct and chronic microvascular ischemia.   Mr Lovenia Kim 02/22/2016 Negative   2D Echocardiogram  - Left ventricle: The cavity size was normal. Wall thickness was increased in a pattern of moderate LVH.  Systolic function was normal. The estimated ejection fraction was in the range of 55% to 60%. Wall motion was normal; there were no regional wall motion abnormalities. Doppler parameters are consistent with abnormal left ventricular relaxation (grade 1 diastolic dysfunction). - Mitral valve: Moderately to severely calcified annulus. - Left atrium: The atrium was severely dilated. - Right ventricle: The cavity size was mildly dilated. Impressions:   No cardiac source of embolism was identified, but cannot be ruled out on the basis of this examination.  Carotid Doppler    There is 1-39% bilateral ICA stenosis. Vertebral artery flow is antegrade.     PHYSICAL EXAM Pleasant elderly Caucasian lady currently not in distress.  . Afebrile. Head is nontraumatic. Neck is supple without bruit.    Cardiac exam no murmur or gallop. Lungs are clear to auscultation. Distal pulses are well felt. Neurological Exam ;  Awake  Alert oriented x 3. Normal speech and language.eye movements full without nystagmus.fundi were not visualized. Vision acuity  appear normal.. Bilateral inferior quadrantanopsia Hearing is normal. Palatal movements are normal. Face symmetric. Tongue midline. Normal strength, tone, reflexes and coordination. Normal sensation. Gait deferred.  ASSESSMENT/PLAN Amy Wiggins is a 82 y.o. female with history of paroxysmal atrial fibrillation on Eliquis, S/P loop recorder implant 02/17/2016, a previous stroke in April 2017, TEE 06/07/15 with PFO, hypertension, diabetes mellitus, and depression presenting with dizziness and visual problems. She did not receive IV t-PA due to being on eliquis and unknown LKW.   Stroke:  L occipital and R cerebellar infarcts embolic secondary to known atrial fibrillation   MRI  L occipital and R cerebellar infarcts. Old R occipital infarct.  MRA  negative  Carotid Doppler  No significant stenosis   2D Echo  EF 55-60%. No source of embolus   LDL 116  HgbA1c 8.8  eliquis for VTE prophylaxis  Diet regular Room service appropriate? Yes; Fluid consistency: Thin  Eliquis (apixaban) daily prior to admission, now on Eliquis (apixaban) daily.   Patient counseled to be compliant with her antithrombotic medications  Ongoing aggressive stroke risk factor management  Therapy recommendations:  HH PT and OT  Disposition:  Return home (lives w/ granddaughter)  Atrial Fibrillation  Home anticoagulation:  Eliquis (apixaban) daily continued in the hospital  Pharmacy to consult for possible dose increase, consider other  DOACS, especially if others can be given at full dose if elquis can't. Pt not interested in coumadin.     Hypertension  Stable  Permissive hypertension (OK if < 220/120) but gradually normalize in 5-7 days  Long-term BP goal normotensive  Hyperlipidemia  Home meds:  lipitor 20, resumed in hospital  LDL 116, goal < 70  Continue statin at discharge  Diabetes  HgbA1c 8.8, goal < 7.0  Uncontrolled  Other Stroke Risk Factors  Advanced age  Obesity, Body mass index is 36.79 kg/m., recommend weight loss, diet and exercise as appropriate   Hx stroke/TIA  05/2015 R PCA infarcts, embolic, unknown source  11/2010 R frontal love infarct  2011 stroke w/ acute severe dysarthria  Family hx stroke (father)  Hospital day # [place] for Pager information 02/23/2016 2:47 PM  I have personally examined this patient, reviewed notes, independently viewed imaging studies, participated in medical decision making and plan of care.ROS completed by me personally and pertinent positives fully documented  I have made any additions or clarifications directly to the above note. Agree with note above. The patient  stated she has been compliant with eliquis. She has presented with embolic strokes and this constitutes a failure of eliquis. I had a long discussion with the patient and her daughter regarding alternatives new or anticoagulants. Recommend switching to Edoxaban 30 mg daily given her impaired renal function. I also recommend discussion with cardiologist about the same. I would prefer the patient to be switched to warfarin for anticoagulation given her impaired renal function but patient is reluctant to do so. Greater than 50% time during this 35 minute visit was spent on counseling and coordination of care about stroke and atrial fibrillation risk, prevention and treatment  Antony Contras, MD Medical Director [place] Pager:  [phone] 02/23/2016 3:09 PM  To contact Stroke Continuity provider, please refer to [email or web address] After hours, contact General Neurology

## 2016-02-23 NOTE — Progress Notes (Signed)
PROGRESS NOTE  Amy Wiggins:[id number] DOB: 23-Apr-1931 DOA: 02/21/2016 PCP: Asencion Noble, MD   LOS: 1 day   Brief Narrative: Amy Wiggins is a 82 y.o. female with medical history significant of depression, diabetes, hypertension, stroke, atrial fibrillation presenting with 2 day history of blurry vision and dizziness, found to have a CVA. Neurology was consulted.  Assessment & Plan: Active Problems:   Essential hypertension   HLD (hyperlipidemia)   Type 2 diabetes mellitus with circulatory disorder (HCC)   Paroxysmal atrial fibrillation (HCC)   Stroke (HCC)   Ischemic stroke (HCC)   Chronic diastolic congestive heart failure (HCC)   AKI (acute kidney injury) ([place])   Diabetes mellitus with complication ([place])   Acute CVA - MRI on admission with acute ischemia within the left occipital lobe within the right cerebellum. Patient was on Eliquis at home, 2.5 mg twice daily due to impaired renal function. Neurology recommends Edoxaban versus Coumadin, to be determined  - 2-D echo done on 12/28 showed normal ejection fraction 55-60%, without any cardiac source of embolism identified - Carotid duplex without significant stenosis - Physical therapy recommended home health physical therapy  PAF on Eliquis, [place] fast for at least 2 - TEE April 2017 with PFO - 2-D echo as below  Chronic kidney disease stage III - Creatinine at baseline  Diabetes mellitus - Sliding scale insulin  Chronic diastolic CHF - She appears euvolemic   DVT prophylaxis: Eliquis Code Status: Full code Family Communication: no family bedside Disposition Plan: home 1 day  Consultants:   Neurology   Procedures:   2D echo  Antimicrobials:  None   Subjective: - no chest pain, shortness of breath, no abdominal pain, nausea or vomiting.   Objective: Vitals:   02/23/16 0102 02/23/16 0532 02/23/16 1013 02/23/16 1523  BP: (!) 144/69 (!) 122/95 114/62 110/68  Pulse: 98 82 97 92  Resp: 20 20  18 10   Temp: 98.5 F (36.9 C) 97.7 F (36.5 C) 98.4 F (36.9 C) 98.4 F (36.9 C)  TempSrc: Oral Oral  Oral  SpO2: 98% 95% 94% 95%  Weight:  94.2 kg (207 lb 10.8 oz)    Height:        Intake/Output Summary (Last 24 hours) at 02/23/16 1556 Last data filed at 02/22/16 2200  Gross per 24 hour  Intake                3 ml  Output                0 ml  Net                3 ml   Filed Weights   02/22/16 0303 02/23/16 0532  Weight: 94 kg (207 lb 4.8 oz) 94.2 kg (207 lb 10.8 oz)    Examination: Constitutional: NAD Vitals:   02/23/16 0102 02/23/16 0532 02/23/16 1013 02/23/16 1523  BP: (!) 144/69 (!) 122/95 114/62 110/68  Pulse: 98 82 97 92  Resp: 20 20 18 10   Temp: 98.5 F (36.9 C) 97.7 F (36.5 C) 98.4 F (36.9 C) 98.4 F (36.9 C)  TempSrc: Oral Oral  Oral  SpO2: 98% 95% 94% 95%  Weight:  94.2 kg (207 lb 10.8 oz)    Height:       Eyes: PERRL, lids and conjunctivae normal Respiratory: clear to auscultation bilaterally, no wheezing, no crackles.  Cardiovascular: Regular rate and rhythm, no murmurs / rubs / gallops. No LE edema.  Abdomen: soft, NT, BS +  Musculoskeletal: no clubbing / cyanosis. Neurologic: non focal    Data Reviewed: I have personally reviewed following labs and imaging studies  CBC:  Recent Labs Lab 02/21/16 1911 02/21/16 1935 02/23/16 0648  WBC 7.4  --  6.4  NEUTROABS 4.2  --   --   HGB 12.6 13.6 12.1  HCT 40.6 40.0 38.5  MCV 92.1  --  91.4  PLT 273  --  564   Basic Metabolic Panel:  Recent Labs Lab 02/21/16 1911 02/21/16 1935 02/23/16 0648  NA 136 136 140  K 3.7 3.7 3.4*  CL 96* 96* 100*  CO2 29  --  31  GLUCOSE 152* 151* 178*  BUN 29* 34* 25*  CREATININE 2.20* 2.30* 1.48*  CALCIUM 10.3  --  9.9   GFR: Estimated Creatinine Clearance: 32 mL/min (by C-G formula based on SCr of 1.48 mg/dL (H)). Liver Function Tests:  Recent Labs Lab 02/21/16 1911  AST 25  ALT 18  ALKPHOS 90  BILITOT 0.5  PROT 6.2*  ALBUMIN 3.4*   No  results for input(s): LIPASE, AMYLASE in the last 168 hours. No results for input(s): AMMONIA in the last 168 hours. Coagulation Profile:  Recent Labs Lab 02/21/16 1911  INR 0.97   Cardiac Enzymes: No results for input(s): CKTOTAL, CKMB, CKMBINDEX, TROPONINI in the last 168 hours. BNP (last 3 results) No results for input(s): PROBNP in the last 8760 hours. HbA1C:  Recent Labs  02/22/16 0729  HGBA1C 8.8*   CBG:  Recent Labs Lab 02/22/16 1138 02/22/16 1653 02/22/16 2115 02/23/16 0616 02/23/16 1101  GLUCAP 119* 133* 271* 168* 214*   Lipid Profile:  Recent Labs  02/22/16 0729  CHOL 231*  HDL 43  LDLCALC 116*  TRIG 361*  CHOLHDL 5.4   Thyroid Function Tests: No results for input(s): TSH, T4TOTAL, FREET4, T3FREE, THYROIDAB in the last 72 hours. Anemia Panel: No results for input(s): VITAMINB12, FOLATE, FERRITIN, TIBC, IRON, RETICCTPCT in the last 72 hours. Urine analysis:    Component Value Date/Time   COLORURINE YELLOW 07/05/2015 [place] 07/05/2015 1645   LABSPEC 1.025 07/05/2015 1645   PHURINE 5.0 07/05/2015 [place] 07/05/2015 1645   HGBUR NEGATIVE 07/05/2015 [place] 07/05/2015 [place] 07/05/2015 1645   PROTEINUR NEGATIVE 07/05/2015 1645   UROBILINOGEN 0.2 12/12/2010 1102   NITRITE NEGATIVE 07/05/2015 1645   LEUKOCYTESUR NEGATIVE 07/05/2015 1645   Sepsis Labs: Invalid input(s): PROCALCITONIN, LACTICIDVEN  No results found for this or any previous visit (from the past 240 hour(s)).    Radiology Studies: Ct Head Wo Contrast  Result Date: 02/21/2016 CLINICAL DATA:  Stroke-like symptoms for more than 8 hours. Blurred vision in the left eye since yesterday. EXAM: CT HEAD WITHOUT CONTRAST TECHNIQUE: Contiguous axial images were obtained from the base of the skull through the vertex without intravenous contrast. COMPARISON:  MRI brain 06/02/2015.  CT head 06/02/2015. FINDINGS: Brain:  There is no evidence of acute intracranial hemorrhage, mass lesion, brain edema or extra-axial fluid collection. The ventricles and subarachnoid spaces are appropriately sized for age. There is no CT evidence of acute cortical infarction. There is encephalomalacia within the right occipital lobe at the site of the previously demonstrated acute infarct. Chronic infarcts are present in the posterior right parietal lobe and left cerebellum. Vascular: Intracranial vascular calcifications. No hyperdense vascular sign. Skull: Negative for fracture or focal lesion. Sinuses/Orbits: The visualized paranasal sinuses and mastoid air cells are clear. No  orbital abnormalities are seen. Other: None. IMPRESSION: 1. No acute intracranial findings. 2. Expected evolution of previously demonstrated right occipital stroke Electronically Signed   By: Richardean Sale M.D.   On: 02/21/2016 20:04   Mr Brain Wo Contrast  Result Date: 02/22/2016 CLINICAL DATA:  Dizziness with pain behind eyes.  Blurred vision. EXAM: MRI HEAD WITHOUT CONTRAST TECHNIQUE: Multiplanar, multiecho pulse sequences of the brain and surrounding structures were obtained without intravenous contrast. COMPARISON:  Head CT 02/21/2016 FINDINGS: Brain: There is focal diffusion restriction within the left occipital lobe and right cerebellum. There is multifocal hyperintense T2-weighted signal within the periventricular white matter, most often seen in the setting of chronic microvascular ischemia. There is an old right occipital lobe infarct. No mass lesion or midline shift. No hydrocephalus or extra-axial fluid collection. The midline structures are normal. No age advanced or lobar predominant atrophy. Vascular: Major intracranial arterial and venous sinus flow voids are preserved. No evidence of chronic microhemorrhage or amyloid angiopathy. Skull and upper cervical spine: The visualized skull base, calvarium, upper cervical spine and extracranial soft tissues are  normal. Sinuses/Orbits: No fluid levels or advanced mucosal thickening. No mastoid effusion. Normal orbits. IMPRESSION: 1. Acute ischemia within the left occipital lobe and within the right cerebellum. Multiple vascular territory involvement suggests a central embolic process. 2. No hemorrhage, midline shift or mass effect. 3. Old right occipital lobe infarct and chronic microvascular ischemia. Electronically Signed   By: Ulyses Jarred M.D.   On: 02/22/2016 01:29   Mr [name] Headm  Result Date: 02/22/2016 CLINICAL DATA:  Stroke EXAM: MRA HEAD WITHOUT CONTRAST TECHNIQUE: Angiographic images of the Circle of Willis were obtained using MRA technique without intravenous contrast. COMPARISON:  MRI head 02/22/2016 FINDINGS: Both vertebral arteries widely patent. PICA patent bilaterally. Basilar is widely patent. Posterior cerebral artery is superior cerebellar arteries are patent bilaterally Internal carotid artery widely patent without stenosis. Anterior and middle cerebral arteries widely patent. Negative for cerebral aneurysm. IMPRESSION: Negative Electronically Signed   By: Franchot Gallo M.D.   On: 02/22/2016 15:53     Scheduled Meds: .  stroke: mapping our early stages of recovery book   Does not apply Once  . apixaban  2.5 mg Oral BID  . aspirin  300 mg Rectal Daily   Or  . aspirin  325 mg Oral Daily  . atorvastatin  20 mg Oral QPM  . insulin aspart  0-5 Units Subcutaneous QHS  . insulin aspart  0-9 Units Subcutaneous TID WC  . magnesium oxide  400 mg Oral QPM  . metoprolol tartrate  25 mg Oral BID  . multivitamin  1 tablet Oral BID  . multivitamin with minerals  1 tablet Oral QHS  . pantoprazole  40 mg Oral Daily  . sodium chloride flush  3 mL Intravenous Q12H  . sodium chloride flush  3 mL Intravenous Q12H  . traZODone  100 mg Oral QHS  . venlafaxine XR  75 mg Oral Q breakfast   Continuous Infusions:  Marzetta Board, MD, PhD [facility] Pager [phone]  If 7PM-7AM, please  contact night-coverage [email or web address] Password [facility] 02/23/2016, 3:56 PM

## 2016-02-23 NOTE — Progress Notes (Signed)
Physical Therapy Treatment Patient Details Name: Amy Wiggins MRN: [id number] DOB: 1932-01-02 Today's Date: 02/23/2016    History of Present Illness Pt is an 82 y/o female who presents with dizziness and blurred vision. MRI revealed acute ischemia within the L occipital lobe and R cerebellum.     PT Comments    Patient seen for mobility progression. At this time, patient is mobilizing well. Noted improvements in balance and stability today. Patient continues to endorse some blurred vision but overall no significant impact on activity. Current POC remains appropriate.  Follow Up Recommendations  Home health PT;Supervision/Assistance - 24 hour (initially - pt reports granddaughter will be there until 1/8)     Equipment Recommendations  None recommended by PT    Recommendations for Other Services       Precautions / Restrictions Precautions Precautions: Fall Restrictions Weight Bearing Restrictions: No    Mobility  Bed Mobility Overal bed mobility: Modified Independent             General bed mobility comments: increased time, no assist required  Transfers Overall transfer level: Modified independent Equipment used: None Transfers: Sit to/from Stand Sit to Stand: Modified independent (Device/Increase time)         General transfer comment: noted sway upon coming to standing, no physical assist or cues required  Ambulation/Gait Ambulation/Gait assistance: Supervision Ambulation Distance (Feet): 510 Feet Assistive device: None Gait Pattern/deviations: Step-through pattern;Decreased stride length;Trunk flexed Gait velocity: Decreased Gait velocity interpretation: Below normal speed for age/gender General Gait Details: improved stability but continues to show some decreased cadence   Stairs            Wheelchair Mobility    Modified Rankin (Stroke Patients Only) Modified Rankin (Stroke Patients Only) Pre-Morbid Rankin Score: No  symptoms Modified Rankin: No significant disability     Balance Overall balance assessment: Needs assistance Sitting-balance support: Feet supported;No upper extremity supported Sitting balance-Leahy Scale: Good     Standing balance support: No upper extremity supported;During functional activity Standing balance-Leahy Scale: Fair               High level balance activites: Side stepping;Backward walking;Direction changes;Turns;Sudden stops;Head turns High Level Balance Comments: Supervision for higher level balance, modest adjustments in gait speed but no overt LOB noted    Cognition Arousal/Alertness: Awake/alert Behavior During Therapy: WFL for tasks assessed/performed Overall Cognitive Status: Within Functional Limits for tasks assessed                      Exercises      General Comments General comments (skin integrity, edema, etc.): spoke with patient regarding stroke signs and education      Pertinent Vitals/Pain Pain Assessment: No/denies pain    Home Living                      Prior Function            PT Goals (current goals can now be found in the care plan section) Acute Rehab PT Goals Patient Stated Goal: Home at d/c PT Goal Formulation: With patient Time For Goal Achievement: 02/29/16 Potential to Achieve Goals: Good Progress towards PT goals: Progressing toward goals    Frequency    Min 3X/week      PT Plan Current plan remains appropriate    Co-evaluation             End of Session Equipment Utilized During Treatment: Gait belt Activity Tolerance: Patient tolerated  treatment well Patient left: in chair;with call bell/phone within reach;with chair alarm set     Time: [phone] PT Time Calculation (min) (ACUTE ONLY): 20 min  Charges:  $Gait Training: 8-22 mins                    G Codes:      Duncan Dull 03-11-2016, 8:55 AM  Alben Deeds, PT DPT  [phone]

## 2016-02-23 NOTE — Evaluation (Signed)
Speech Language Pathology Evaluation Patient Details Name: Amy Wiggins MRN: [id number] DOB: 09-Feb-1932 Today's Date: 02/23/2016 Time: 1200-1210 SLP Time Calculation (min) (ACUTE ONLY): 10 min  Problem List:  Patient Active Problem List   Diagnosis Date Noted  . Stroke ([place]) 02/22/2016  . Ischemic stroke ([place]) 02/22/2016  . Chronic diastolic congestive heart failure ([place]) 02/22/2016  . AKI (acute kidney injury) ([place]) 02/22/2016  . Diabetes mellitus with complication ([place])   . Shortness of breath 01/14/2016  . Wheezing 01/14/2016  . Paroxysmal atrial fibrillation ([place]) 11/14/2015  . Chronic anticoagulation 11/14/2015  . Cerebrovascular accident (CVA) due to embolism of right posterior cerebral artery ([place]) 08/08/2015  . Essential hypertension 08/08/2015  . HLD (hyperlipidemia) 08/08/2015  . Type 2 diabetes mellitus with circulatory disorder ([place]) 08/08/2015  . PFO (patent foramen ovale)   . ARF (acute renal failure) ([place]) 06/04/2015  . Cerebral infarction due to unspecified mechanism   . Acute CVA (cerebrovascular accident) ([place]) 06/02/2015  . Hypertension 06/02/2015  . Hyperlipidemia 06/02/2015  . CKD (chronic kidney disease) stage 4, GFR 15-29 ml/min (HCC) 06/02/2015  . DM type 2 (diabetes mellitus, type 2) ([place]) 06/02/2015  . Leukocytosis 06/02/2015  . Muscle weakness (generalized) 09/16/2012  . Pain in joint, shoulder region 09/16/2012  . Closed displaced fracture of lateral end of clavicle 08/18/2012   Past Medical History:  Past Medical History:  Diagnosis Date  . Depression   . Diabetes mellitus   . Hypertension   . PAF (paroxysmal atrial fibrillation) ([place])   . Stroke ([place])    TIA's x 2   Past Surgical History:  Past Surgical History:  Procedure Laterality Date  . COLONOSCOPY    . COLONOSCOPY N/A 09/09/2014   Procedure: COLONOSCOPY;  Surgeon: Rogene Houston, MD;  [facility];  Service: Endoscopy;  Laterality: N/A;  1010 - moved to 7:30 - Ann  to notify  . EP IMPLANTABLE DEVICE N/A 06/07/2015   Procedure: Loop Recorder Insertion;  Surgeon: Deboraha Sprang, MD;  Location: [facility];  Service: Cardiovascular;  Laterality: N/A;  . TEE WITHOUT CARDIOVERSION N/A 06/07/2015   Procedure: TRANSESOPHAGEAL ECHOCARDIOGRAM (TEE);  Surgeon: Thayer Headings, MD;  Location: [facility];  Service: Cardiovascular;  Laterality: N/A;   HPI:  Amy Mignogna Pruittis an 82 y.o.femalepatient of Dr. Knute Neu in [place] a history of paroxysmal atrial fibrillation on Eliquis, S/P loop recorder implant 02/17/2016, a previous stroke in April 2017, TEE 06/07/15 with PFO, hypertension, diabetes mellitus, and depression.The patient sees Dr. Erlinda Hong and has a follow-up appointment scheduled in the near future. Approximately 2 days ago she awoke with dizziness and visual problems. This improved somewhat during the day but never resolved. She noted that the visual changes appear to affect mainly her left eye which is the same eye affected by her stroke in April. She eventually presented to [facility] on [facility]/21[facility]2016 where an MRI revealed acute ischemia within the left occipital lobe and within the right cerebellum. Multiple vascular territory involvement suggesteda central embolic process. The patient denies missing any doses of her medications. of note, pt with history of CVA on 06/02/15 and was evaluated by ST. At that time, ST services were not indicated d/t functional cognitive-linguistic and swallow abilities.    Assessment / Plan / Recommendation Clinical Impression  Pt appears to have functional cognitive linguistic abilities. No further ST services are indicated at this time. ST to sign off.     SLP Assessment  Patient does not need  any further Speech Lanaguage Pathology Services    Follow Up Recommendations  None          SLP Evaluation Cognition  Overall Cognitive Status: Within Functional Limits for tasks assessed Arousal/Alertness:  Awake/alert Orientation Level: Oriented X4       Comprehension  Auditory Comprehension Overall Auditory Comprehension: Appears within functional limits for tasks assessed    Expression Expression Primary Mode of Expression: Verbal Verbal Expression Overall Verbal Expression: Appears within functional limits for tasks assessed Non-Verbal Means of Communication: Not applicable   Oral / Motor  Oral Motor/Sensory Function Overall Oral Motor/Sensory Function: Within functional limits Motor Speech Overall Motor Speech: Appears within functional limits for tasks assessed Respiration: Within functional limits Phonation: Normal Resonance: Within functional limits Articulation: Within functional limitis Intelligibility: Intelligible Motor Planning: Witnin functional limits Motor Speech Errors: Not applicable   GO            Amy Boyan B. Rutherford Nail, M.S., CCC-SLP Speech-Language Pathologist         Amy Wiggins 02/23/2016, 12:10 PM

## 2016-02-24 DIAGNOSIS — I639 Cerebral infarction, unspecified: Secondary | ICD-10-CM

## 2016-02-24 LAB — GLUCOSE, CAPILLARY
GLUCOSE-CAPILLARY: 181 mg/dL — AB (ref 65–99)
GLUCOSE-CAPILLARY: 227 mg/dL — AB (ref 65–99)

## 2016-02-24 MED ORDER — EDOXABAN TOSYLATE 30 MG PO TABS: 30.0000 mg | ORAL_TABLET | ORAL | 1 refills | Status: DC

## 2016-02-24 MED ORDER — ATORVASTATIN CALCIUM 40 MG PO TABS
40.0000 mg | ORAL_TABLET | Freq: Every evening | ORAL | Status: DC
Start: 2016-02-24 — End: 2016-02-24

## 2016-02-24 MED ORDER — EDOXABAN TOSYLATE 30 MG PO TABS: 30.0000 mg | ORAL_TABLET | ORAL | Status: DC

## 2016-02-24 MED ORDER — ATORVASTATIN CALCIUM 20 MG PO TABS: 40.0000 mg | ORAL_TABLET | Freq: Every evening | ORAL | 0 refills | Status: DC

## 2016-02-24 NOTE — Discharge Instructions (Addendum)
Follow with Asencion Noble, MD in 5-7 days  No driving until cleared by primary MD and ophthalmology  Please get a complete blood count and chemistry panel checked by your Primary MD at your next visit, and again as instructed by your Primary MD. Please get your medications reviewed and adjusted by your Primary MD.  Please request your Primary MD to go over all Hospital Tests and Procedure/Radiological results at the follow up, please get all Hospital records sent to your Prim MD by signing hospital release before you go home.  If you had Pneumonia of Lung problems at the Hospital: Please get a 2 view Chest X ray done in 6-8 weeks after hospital discharge or sooner if instructed by your Primary MD.  If you have Congestive Heart Failure: Please call your Cardiologist or Primary MD anytime you have any of the following symptoms:  1) 3 pound weight gain in 24 hours or 5 pounds in 1 week  2) shortness of breath, with or without a dry hacking cough  3) swelling in the hands, feet or stomach  4) if you have to sleep on extra pillows at night in order to breathe  Follow cardiac low salt diet and 1.5 lit/day fluid restriction.  If you have diabetes Accuchecks 4 times/day, Once in AM empty stomach and then before each meal. Log in all results and show them to your primary doctor at your next visit. If any glucose reading is under 80 or above 300 call your primary MD immediately.  If you have Seizure/Convulsions/Epilepsy: Please do not drive, operate heavy machinery, participate in activities at heights or participate in high speed sports until you have seen by Primary MD or a Neurologist and advised to do so again.  If you had Gastrointestinal Bleeding: Please ask your Primary MD to check a complete blood count within one week of discharge or at your next visit. Your endoscopic/colonoscopic biopsies that are pending at the time of discharge, will also need to followed by your Primary MD.  Get  Medicines reviewed and adjusted. Please take all your medications with you for your next visit with your Primary MD  Please request your Primary MD to go over all hospital tests and procedure/radiological results at the follow up, please ask your Primary MD to get all Hospital records sent to his/her office.  If you experience worsening of your admission symptoms, develop shortness of breath, life threatening emergency, suicidal or homicidal thoughts you must seek medical attention immediately by calling 911 or calling your MD immediately  if symptoms less severe.  You must read complete instructions/literature along with all the possible adverse reactions/side effects for all the Medicines you take and that have been prescribed to you. Take any new Medicines after you have completely understood and accpet all the possible adverse reactions/side effects.   Do not drive or operate heavy machinery when taking Pain medications.   Do not take more than prescribed Pain, Sleep and Anxiety Medications  Special Instructions: If you have smoked or chewed Tobacco  in the last 2 yrs please stop smoking, stop any regular Alcohol  and or any Recreational drug use.  Wear Seat belts while driving.  Please note You were cared for by a hospitalist during your hospital stay. If you have any questions about your discharge medications or the care you received while you were in the hospital after you are discharged, you can call the unit and asked to speak with the hospitalist on call if the hospitalist  that took care of you is not available. Once you are discharged, your primary care physician will handle any further medical issues. Please note that NO REFILLS for any discharge medications will be authorized once you are discharged, as it is imperative that you return to your primary care physician (or establish a relationship with a primary care physician if you do not have one) for your aftercare needs so that they  can reassess your need for medications and monitor your lab values.  You can reach the hospitalist office at phone [phone] or fax [phone]   If you do not have a primary care physician, you can call [phone] for a physician referral.  Activity: As tolerated with Full fall precautions use walker/cane & assistance as needed  Diet: heart healthy  Disposition Home

## 2016-02-24 NOTE — Discharge Summary (Signed)
Physician Discharge Summary  Amy Wiggins DXA:[id number] DOB: [birth year] DOA: 02/21/2016  PCP: Asencion Noble, MD  Admit date: 02/21/2016 Discharge date: 02/24/2016  Admitted From: home Disposition:  home  Recommendations for Outpatient Follow-up:  1. Follow up with PCP in 1-2 weeks 2. Patient started on Edoxaban this admission and her Eliquis was stopped 3. Follow up with Dr. Erlinda Hong as scheduled  Home Health: PT Equipment/Devices: none  Discharge Condition: stable CODE STATUS: Full Diet recommendation: heart healthy  HPI: Per Dr. Marily Memos, Amy Wiggins is a 82 y.o. female with medical history significant of depression, diabetes, hypertension, stroke, atrial fibrillation presenting with 2 day history of blurry vision and dizziness. Denies true vertigo. Symptoms are constant but gradually improving. Denies any associated chest pain, palpitations, shortness of breath, neck stiffness, fevers, cough, abdominal pain, dysuria, frequency, diarrhea. Worse with movement. No real improvement with rest. Has not tried anything for symptoms.   Hospital Course: Discharge Diagnoses:  Active Problems:   Essential hypertension   HLD (hyperlipidemia)   Type 2 diabetes mellitus with circulatory disorder (HCC)   Paroxysmal atrial fibrillation (HCC)   Stroke (HCC)   Ischemic stroke (HCC)   Chronic diastolic congestive heart failure (HCC)   AKI (acute kidney injury) ([place])   Diabetes mellitus with complication ([place])   Cerebellar infarct ([place])  Acute CVA - MRI on admission with acute ischemia within the left occipital lobe within the right cerebellum. Patient was on Eliquis at home, 2.5 mg twice daily due to impaired renal function and she had a stroke on that. I discussed with the stroke team, and I have had extensive discussions with the patient as well, neurology recommends Edoxaban versus Coumadin as a replacement to her Eliquis. Per patient preference, she will go home on Edoxaban, dosed as  per pharmacy, coupon given to her for 30 days. Carotid management consult appreciated. 2-D echo done on 12/28 showed normal ejection fraction 55-60%, without any cardiac source of embolism identified. Carotid duplex without significant stenosis. Physical therapy recommended home health physical therapy which was arranged PAF on Eliquis, [place] fast for at least 2 - TEE April 2017 with PFO, 2-D echo done this admission, anticoagulation as above Chronic kidney disease stage III - Creatinine at baseline Diabetes mellitus - Sliding scale insulin Chronic diastolic CHF - She appears euvolemic  Discharge Instructions  Discharge Instructions    Ambulatory referral to Neurology    Complete by:  As directed    An appointment is requested in approximately: 8 weeks     Allergies as of 02/24/2016   No Known Allergies     Medication List    STOP taking these medications   apixaban 2.5 MG Tabs tablet Commonly known as:  ELIQUIS   doxycycline 100 MG tablet Commonly known as:  VIBRA-TABS   predniSONE 10 MG tablet Commonly known as:  DELTASONE     TAKE these medications   acetaminophen 500 MG tablet Commonly known as:  TYLENOL Take 1,000 mg by mouth See admin instructions. Take 2 tablets (1000 mg) by mouth daily at bedtime, may also take 2 tablets during the day as needed for pain   atorvastatin 20 MG tablet Commonly known as:  LIPITOR Take 2 tablets (40 mg total) by mouth every evening. 4pm What changed:  how much to take   chlorthalidone 25 MG tablet Commonly known as:  HYGROTON Take 25 mg by mouth daily. For high blood pressure/fluid   edoxaban 30 MG Tabs tablet Commonly known as:  SAVAYSA Take  1 tablet (30 mg total) by mouth daily.   glimepiride 2 MG tablet Commonly known as:  AMARYL Take 2 mg by mouth daily with breakfast. For Diabetes   losartan 100 MG tablet Commonly known as:  COZAAR Take 100 mg by mouth every evening. For high blood pressure - 4pm   magnesium oxide 400  MG tablet Commonly known as:  MAG-OX Take 400 mg by mouth every evening. 4pm   metoprolol tartrate 25 MG tablet Commonly known as:  LOPRESSOR Take 1 tablet (25 mg total) by mouth 2 (two) times daily.   multivitamin with minerals Tabs tablet Take 1 tablet by mouth at bedtime. For supplement   pantoprazole 40 MG tablet Commonly known as:  PROTONIX Take 40 mg by mouth daily. For acid reflux/gerd   pioglitazone 15 MG tablet Commonly known as:  ACTOS Take 15 mg by mouth every evening. 4pm   PRESERVISION AREDS 2 Caps Take 1 capsule by mouth 2 (two) times daily.   traZODone 100 MG tablet Commonly known as:  DESYREL Take 100 mg by mouth at bedtime.   venlafaxine XR 75 MG 24 hr capsule Commonly known as:  EFFEXOR-XR Take 75 mg by mouth daily with breakfast.   VICTOZA 18 MG/3ML Sopn Generic drug:  liraglutide Inject 1.8 mg into the skin at bedtime.       No Known Allergies  Consultations:  Neurology   Procedures/Studies:  2D echo  Ct Head Wo Contrast  Result Date: 02/21/2016 CLINICAL DATA:  Stroke-like symptoms for more than 8 hours. Blurred vision in the left eye since yesterday. EXAM: CT HEAD WITHOUT CONTRAST TECHNIQUE: Contiguous axial images were obtained from the base of the skull through the vertex without intravenous contrast. COMPARISON:  MRI brain 06/02/2015.  CT head 06/02/2015. FINDINGS: Brain: There is no evidence of acute intracranial hemorrhage, mass lesion, brain edema or extra-axial fluid collection. The ventricles and subarachnoid spaces are appropriately sized for age. There is no CT evidence of acute cortical infarction. There is encephalomalacia within the right occipital lobe at the site of the previously demonstrated acute infarct. Chronic infarcts are present in the posterior right parietal lobe and left cerebellum. Vascular: Intracranial vascular calcifications. No hyperdense vascular sign. Skull: Negative for fracture or focal lesion. Sinuses/Orbits:  The visualized paranasal sinuses and mastoid air cells are clear. No orbital abnormalities are seen. Other: None. IMPRESSION: 1. No acute intracranial findings. 2. Expected evolution of previously demonstrated right occipital stroke Electronically Signed   By: Richardean Sale M.D.   On: 02/21/2016 20:04   Mr Brain Wo Contrast  Result Date: 02/22/2016 CLINICAL DATA:  Dizziness with pain behind eyes.  Blurred vision. EXAM: MRI HEAD WITHOUT CONTRAST TECHNIQUE: Multiplanar, multiecho pulse sequences of the brain and surrounding structures were obtained without intravenous contrast. COMPARISON:  Head CT 02/21/2016 FINDINGS: Brain: There is focal diffusion restriction within the left occipital lobe and right cerebellum. There is multifocal hyperintense T2-weighted signal within the periventricular white matter, most often seen in the setting of chronic microvascular ischemia. There is an old right occipital lobe infarct. No mass lesion or midline shift. No hydrocephalus or extra-axial fluid collection. The midline structures are normal. No age advanced or lobar predominant atrophy. Vascular: Major intracranial arterial and venous sinus flow voids are preserved. No evidence of chronic microhemorrhage or amyloid angiopathy. Skull and upper cervical spine: The visualized skull base, calvarium, upper cervical spine and extracranial soft tissues are normal. Sinuses/Orbits: No fluid levels or advanced mucosal thickening. No mastoid effusion. Normal orbits.  IMPRESSION: 1. Acute ischemia within the left occipital lobe and within the right cerebellum. Multiple vascular territory involvement suggests a central embolic process. 2. No hemorrhage, midline shift or mass effect. 3. Old right occipital lobe infarct and chronic microvascular ischemia. Electronically Signed   By: Ulyses Jarred M.D.   On: 02/22/2016 01:29   Mr [name] Headm  Result Date: 02/22/2016 CLINICAL DATA:  Stroke EXAM: MRA HEAD WITHOUT CONTRAST TECHNIQUE:  Angiographic images of the Circle of Willis were obtained using MRA technique without intravenous contrast. COMPARISON:  MRI head 02/22/2016 FINDINGS: Both vertebral arteries widely patent. PICA patent bilaterally. Basilar is widely patent. Posterior cerebral artery is superior cerebellar arteries are patent bilaterally Internal carotid artery widely patent without stenosis. Anterior and middle cerebral arteries widely patent. Negative for cerebral aneurysm. IMPRESSION: Negative Electronically Signed   By: Franchot Gallo M.D.   On: 02/22/2016 15:53     Subjective: - no chest pain, shortness of breath, no abdominal pain, nausea or vomiting.   Discharge Exam: Vitals:   02/24/16 1043 02/24/16 1318  BP: (!) 120/57 119/64  Pulse: 85 93  Resp: 20 20  Temp: 98.4 F (36.9 C) 98 F (36.7 C)   Vitals:   02/24/16 0200 02/24/16 0625 02/24/16 1043 02/24/16 1318  BP: 120/72 131/78 (!) 120/57 119/64  Pulse: 98 94 85 93  Resp:  18 20 20   Temp: 98.4 F (36.9 C) 98.2 F (36.8 C) 98.4 F (36.9 C) 98 F (36.7 C)  TempSrc: Oral Oral Oral Oral  SpO2: 95% 96% 98% 95%  Weight:  93.5 kg (206 lb 3.2 oz)    Height:        General: Pt is alert, awake, not in acute distress Cardiovascular: RRR, S1/S2 +, no rubs, no gallops Respiratory: CTA bilaterally, no wheezing, no rhonchi Abdominal: Soft, NT, ND, bowel sounds + Extremities: no edema, no cyanosis    The results of significant diagnostics from this hospitalization (including imaging, microbiology, ancillary and laboratory) are listed below for reference.     Microbiology: No results found for this or any previous visit (from the past 240 hour(s)).   Labs: BNP (last 3 results)  Recent Labs  01/14/16 1600  BNP 270.3*   Basic Metabolic Panel:  Recent Labs Lab 02/21/16 1911 02/21/16 1935 02/23/16 0648  NA 136 136 140  K 3.7 3.7 3.4*  CL 96* 96* 100*  CO2 29  --  31  GLUCOSE 152* 151* 178*  BUN 29* 34* 25*  CREATININE 2.20* 2.30*  1.48*  CALCIUM 10.3  --  9.9   Liver Function Tests:  Recent Labs Lab 02/21/16 1911  AST 25  ALT 18  ALKPHOS 90  BILITOT 0.5  PROT 6.2*  ALBUMIN 3.4*   No results for input(s): LIPASE, AMYLASE in the last 168 hours. No results for input(s): AMMONIA in the last 168 hours. CBC:  Recent Labs Lab 02/21/16 1911 02/21/16 1935 02/23/16 0648  WBC 7.4  --  6.4  NEUTROABS 4.2  --   --   HGB 12.6 13.6 12.1  HCT 40.6 40.0 38.5  MCV 92.1  --  91.4  PLT 273  --  260   Cardiac Enzymes: No results for input(s): CKTOTAL, CKMB, CKMBINDEX, TROPONINI in the last 168 hours. BNP: Invalid input(s): POCBNP CBG:  Recent Labs Lab 02/23/16 1101 02/23/16 1746 02/23/16 2122 02/24/16 0625 02/24/16 1135  GLUCAP 214* 332* 143* 181* 227*   D-Dimer No results for input(s): DDIMER in the last 72 hours. Hgb A1c  Recent Labs  02/22/16 0729 02/23/16 0648  HGBA1C 8.8* 8.8*   Lipid Profile  Recent Labs  02/22/16 0729  CHOL 231*  HDL 43  LDLCALC 116*  TRIG 361*  CHOLHDL 5.4   Thyroid function studies No results for input(s): TSH, T4TOTAL, T3FREE, THYROIDAB in the last 72 hours.  Invalid input(s): FREET3 Anemia work up No results for input(s): VITAMINB12, FOLATE, FERRITIN, TIBC, IRON, RETICCTPCT in the last 72 hours. Urinalysis    Component Value Date/Time   COLORURINE YELLOW 07/05/2015 [place] 07/05/2015 1645   LABSPEC 1.025 07/05/2015 1645   PHURINE 5.0 07/05/2015 1645   GLUCOSEU NEGATIVE 07/05/2015 1645   HGBUR NEGATIVE 07/05/2015 1645   BILIRUBINUR NEGATIVE 07/05/2015 1645   KETONESUR NEGATIVE 07/05/2015 1645   PROTEINUR NEGATIVE 07/05/2015 1645   UROBILINOGEN 0.2 12/12/2010 1102   NITRITE NEGATIVE 07/05/2015 1645   LEUKOCYTESUR NEGATIVE 07/05/2015 1645   Sepsis Labs Invalid input(s): PROCALCITONIN,  WBC,  LACTICIDVEN Microbiology No results found for this or any previous visit (from the past 240 hour(s)).   Time coordinating discharge: Over  30 minutes  SIGNED:  Marzetta Board, MD  [facility] 02/24/2016, 3:08 PM Pager [phone]  If 7PM-7AM, please contact night-coverage [email or web address] Password [email or web address]

## 2016-02-24 NOTE — Progress Notes (Signed)
ANTICOAGULATION CONSULT NOTE - Initial Consult  Pharmacy Consult for Edoxaban [facility]) Indication: atrial fibrillation and stroke  Assessment: 82 yoF presents 12/28 with new onset CVA. Prior to admission, she was anticoagulated with Eliquis (apixaban) 2.5 mg BID for h/o Afib. Pharmacy has been consulted to convert the patient from apixaban to edoxaban. Last dose of apixaban 0850 today with next dose due at 2200. According to edoxaban's package insert, it should be started at the time of the next apixaban dose (2200 today) and can be given without regard to food. Given the patient's renal function, the dose of edoxaban should be reduced to 30 mg daily (scr 1.48, CrCl ~30-35 ml/min). The Beer's Criteria suggests avoiding the use of edoxaban in patients >25 years old if CrCl is less than 30 ml/min due to increased risk of bleeding. Currently, no bleeding noted.   Goal of Therapy:  Monitor platelets by anticoagulation protocol: Yes   Plan:  Edoxaban 30 mg po daily Monitor renal function and s/sx's bleeding    No Known Allergies  Patient Measurements: Height: 5\' 3"  (160 cm) Weight: 206 lb 3.2 oz (93.5 kg) IBW/kg (Calculated) : 52.4  Vital Signs: Temp: 98.4 F (36.9 C) (12/30 1043) Temp Source: Oral (12/30 1043) BP: 120/57 (12/30 1043) Pulse Rate: 85 (12/30 1043)  Labs:  Recent Labs  02/21/16 1911 02/21/16 1935 02/23/16 0648  HGB 12.6 13.6 12.1  HCT 40.6 40.0 38.5  PLT 273  --  260  APTT 31  --   --   LABPROT 12.9  --   --   INR 0.97  --   --   CREATININE 2.20* 2.30* 1.48*    Estimated Creatinine Clearance: 31.8 mL/min (by C-G formula based on SCr of 1.48 mg/dL (H)).   Medical History: Past Medical History:  Diagnosis Date  . Depression   . Diabetes mellitus   . Hypertension   . PAF (paroxysmal atrial fibrillation) ([place])   . Stroke ([place])    TIA's x 2    Medications:  Scheduled:  .  stroke: mapping our early stages of recovery book   Does not apply Once  .  aspirin  300 mg Rectal Daily   Or  . aspirin  325 mg Oral Daily  . atorvastatin  20 mg Oral QPM  . edoxaban  30 mg Oral Q24H  . insulin aspart  0-5 Units Subcutaneous QHS  . insulin aspart  0-9 Units Subcutaneous TID WC  . magnesium oxide  400 mg Oral QPM  . metoprolol tartrate  25 mg Oral BID  . multivitamin  1 tablet Oral BID  . multivitamin with minerals  1 tablet Oral QHS  . pantoprazole  40 mg Oral Daily  . sodium chloride flush  3 mL Intravenous Q12H  . sodium chloride flush  3 mL Intravenous Q12H  . traZODone  100 mg Oral QHS  . venlafaxine XR  75 mg Oral Q breakfast    Belia Heman, PharmD PGY1 Pharmacy Resident [phone] (Pager) 02/24/2016 11:42 AM

## 2016-02-24 NOTE — Progress Notes (Signed)
STROKE TEAM PROGRESS NOTE   HISTORY OF PRESENT ILLNESS (per record) Amy Wiggins is an 82 y.o. female patient of Dr. Knute Neu in [place] with a history of paroxysmal atrial fibrillation on Eliquis, S/P loop recorder implant 02/17/2016, a previous stroke in April 2017, TEE 06/07/15 with PFO, hypertension, diabetes mellitus, and depression. The patient sees Dr. Erlinda Hong and has a follow-up appointment scheduled in the near future. Approximately 2 days ago she awoke with dizziness and visual problems. This improved somewhat during the day but never resolved. She noted that the visual changes appear to affect mainly her left eye which is the same eye affected by her stroke in April. She eventually presented to [facility] on 02/21/2016 where an MRI revealed acute ischemia within the left occipital lobe and within the right cerebellum. Multiple vascular territory involvement suggested a central embolic process. The patient denies missing any doses of her medications. Her last known well was could not be determined. The patient was not administered IV t-PA secondary to being on Eliquis as well as being out of the window for treatment. She was admitted for further evaluation and treatment.   SUBJECTIVE (INTERVAL HISTORY) No family is at the bedside.  Overall she feels her condition is stable. She agreed to see eye doctor after discharge for her visual field loss with the stroke. She was advised not to drive. She prefers to stay on Eliquis.  ROS negative   OBJECTIVE Temp:  [98.1 F (36.7 C)-98.4 F (36.9 C)] 98.2 F (36.8 C) (12/30 0625) Pulse Rate:  [92-101] 94 (12/30 0625) Cardiac Rhythm: Sinus tachycardia (12/29 1900) Resp:  [10-18] 18 (12/30 0625) BP: (110-131)/(56-78) 131/78 (12/30 0625) SpO2:  [92 %-96 %] 96 % (12/30 0625) Weight:  [93.5 kg (206 lb 3.2 oz)] 93.5 kg (206 lb 3.2 oz) (12/30 0625)  CBC:   Recent Labs Lab 02/21/16 1911 02/21/16 1935 02/23/16 0648  WBC 7.4  --  6.4   NEUTROABS 4.2  --   --   HGB 12.6 13.6 12.1  HCT 40.6 40.0 38.5  MCV 92.1  --  91.4  PLT 273  --  578    Basic Metabolic Panel:   Recent Labs Lab 02/21/16 1911 02/21/16 1935 02/23/16 0648  NA 136 136 140  K 3.7 3.7 3.4*  CL 96* 96* 100*  CO2 29  --  31  GLUCOSE 152* 151* 178*  BUN 29* 34* 25*  CREATININE 2.20* 2.30* 1.48*  CALCIUM 10.3  --  9.9    Lipid Panel:     Component Value Date/Time   CHOL 231 (H) 02/22/2016 0729   TRIG 361 (H) 02/22/2016 0729   HDL 43 02/22/2016 0729   CHOLHDL 5.4 02/22/2016 0729   VLDL 72 (H) 02/22/2016 0729   LDLCALC 116 (H) 02/22/2016 0729   HgbA1c:  Lab Results  Component Value Date   HGBA1C 8.8 (H) 02/23/2016   Urine Drug Screen: No results found for: LABOPIA, COCAINSCRNUR, LABBENZ, AMPHETMU, THCU, LABBARB    IMAGING  Ct Head Wo Contrast 02/21/2016 1. No acute intracranial findings. 2. Expected evolution of previously demonstrated right occipital stroke   Mr Brain Wo Contrast 02/22/2016 1. Acute ischemia within the left occipital lobe and within the right cerebellum. Multiple vascular territory involvement suggests a central embolic process.  2. No hemorrhage, midline shift or mass effect.  3. Old right occipital lobe infarct and chronic microvascular ischemia.   Mr Lovenia Kim 02/22/2016 Negative    2D Echocardiogram  - Left  ventricle: The cavity size was normal. Wall thickness was increased in a pattern of moderate LVH. Systolic function was normal. The estimated ejection fraction was in the range of 55% to 60%. Wall motion was normal; there were no regional wall motion abnormalities. Doppler parameters are consistent with abnormal left ventricular relaxation (grade 1 diastolic dysfunction). - Mitral valve: Moderately to severely calcified annulus. - Left atrium: The atrium was severely dilated. - Right ventricle: The cavity size was mildly dilated. Impressions:   No cardiac source of embolism was identified, but cannot  be ruled out on the basis of this examination.    Carotid Doppler   There is 1-39% bilateral ICA stenosis. Vertebral artery flow is antegrade.      PHYSICAL EXAM Pleasant elderly Caucasian lady currently not in distress.  . Afebrile. Head is nontraumatic. Neck is supple without bruit.    Cardiac exam no murmur or gallop. Lungs are clear to auscultation. Distal pulses are well felt. Neurological Exam ;  Awake  Alert oriented x 3. Normal speech and language.eye movements full without nystagmus. Bilateral inferior quadrantanopsia,  Hearing is normal. Palatal movements are normal. Face symmetric. Tongue midline.  Normal strength, tone, reflexes and coordination. Normal sensation. Gait deferred.  ASSESSMENT/PLAN Amy Wiggins is a 82 y.o. female with history of paroxysmal atrial fibrillation on Eliquis, S/P loop recorder implant 02/17/2016, a previous stroke in April 2017, TEE 06/07/15 with PFO, hypertension, diabetes mellitus, and depression presenting with dizziness and visual problems. She did not receive IV t-PA due to being on eliquis and unknown LKW.   Stroke:  L occipital and R cerebellar infarcts embolic secondary to known atrial fibrillation   MRI  L occipital and R cerebellar infarcts. Old R occipital infarct.  MRA  negative  Carotid Doppler  No significant stenosis   2D Echo  EF 55-60%. No source of embolus   LDL 116  HgbA1c 8.8  Eliquis for VTE prophylaxis Diet regular Room service appropriate? Yes; Fluid consistency: Thin  Eliquis (apixaban) daily prior to admission, now on Eliquis (apixaban) daily.   Patient counseled to be compliant with her antithrombotic medications  Ongoing aggressive stroke risk factor management  Therapy recommendations:  HH PT and OT  Disposition:  Return home (lives w/ granddaughter)  Atrial Fibrillation  Home anticoagulation:  Eliquis (apixaban) daily continued in the hospital  Pharmacy to consult for possible dose increase,  consider other DOACS, especially if others can be given at full dose if elquis can't. Pt not interested in coumadin.     Hypertension  Stable  Permissive hypertension (OK if < 220/120) but gradually normalize in 5-7 days  Long-term BP goal normotensive  Hyperlipidemia  Home meds:  lipitor 20, resumed in hospital  LDL 116, goal < 70   Increase Lipitor to 40 mg daily  Continue statin at discharge  Diabetes  HgbA1c 8.8, goal < 7.0  Uncontrolled   Other Stroke Risk Factors  Advanced age  Obesity, Body mass index is 36.53 kg/m., recommend weight loss, diet and exercise as appropriate  Hx stroke/TIA  05/2015 R PCA infarcts, embolic, unknown source  11/2010 R frontal love infarct  2011 stroke w/ acute severe dysarthria  Family hx stroke (father)  Other Active Problems  Hypokalemia  Renal insufficiency  ATTENDING NOTE: Patient was seen and examined by me personally. Documentation reflects findings. The laboratory and radiographic studies reviewed by me. ROS completed by me personally and pertinent positives fully documented   Condition:  Assessment and plan completed by  me personally and fully documented above. Plans/Recommendations include:     Patient should be seen by ophth. And cleared before allowed to drive  Patient should be seen in follow-up by Stroke Neurology in 6-8 weeks  Agree with anticoagulation for stroke in the setting of afib.  Primary team discussed NOACs with her   No further recommendations  SIGNED BY: Dr. Laddie Aquas day # 2   To contact Stroke Continuity provider, please refer to [email or web address] After hours, contact General Neurology

## 2016-02-24 NOTE — Progress Notes (Signed)
Patient given discharge instructions. All questions and concerns addressed.

## 2016-03-04 ENCOUNTER — Encounter: Payer: Self-pay | Admitting: Neurology

## 2016-03-04 ENCOUNTER — Ambulatory Visit (INDEPENDENT_AMBULATORY_CARE_PROVIDER_SITE_OTHER): Payer: Medicare HMO | Admitting: *Deleted

## 2016-03-04 ENCOUNTER — Ambulatory Visit (INDEPENDENT_AMBULATORY_CARE_PROVIDER_SITE_OTHER): Payer: Medicare HMO | Admitting: Neurology

## 2016-03-04 VITALS — BP 96/66 | HR 81 | Ht 63.0 in | Wt 204.2 lb

## 2016-03-04 DIAGNOSIS — I639 Cerebral infarction, unspecified: Secondary | ICD-10-CM | POA: Diagnosis not present

## 2016-03-04 DIAGNOSIS — E785 Hyperlipidemia, unspecified: Secondary | ICD-10-CM

## 2016-03-04 DIAGNOSIS — Q2112 Patent foramen ovale: Secondary | ICD-10-CM

## 2016-03-04 DIAGNOSIS — I63431 Cerebral infarction due to embolism of right posterior cerebral artery: Secondary | ICD-10-CM | POA: Diagnosis not present

## 2016-03-04 DIAGNOSIS — I10 Essential (primary) hypertension: Secondary | ICD-10-CM

## 2016-03-04 DIAGNOSIS — Z7901 Long term (current) use of anticoagulants: Secondary | ICD-10-CM | POA: Diagnosis not present

## 2016-03-04 DIAGNOSIS — I480 Paroxysmal atrial fibrillation: Secondary | ICD-10-CM | POA: Diagnosis not present

## 2016-03-04 DIAGNOSIS — Z4509 Encounter for adjustment and management of other cardiac device: Secondary | ICD-10-CM | POA: Diagnosis not present

## 2016-03-04 DIAGNOSIS — Q211 Atrial septal defect: Secondary | ICD-10-CM | POA: Diagnosis not present

## 2016-03-04 MED ORDER — APIXABAN 5 MG PO TABS: 5.0000 mg | ORAL_TABLET | Freq: Two times a day (BID) | ORAL | 5 refills | Status: DC

## 2016-03-04 NOTE — Progress Notes (Signed)
STROKE NEUROLOGY FOLLOW UP NOTE  NAME: Amy Wiggins DOB: Jul 22, 1931  REASON FOR VISIT: stroke follow up HISTORY FROM: pt and chart  Today we had the pleasure of seeing Amy Wiggins in follow-up at our [facility]. Pt was accompanied by granddaughter.   History Summary Amy Wiggins is a 83 y.o. female with history of hypertension, hyperlipidemia, chronic kidney disease, diabetes mellitus, and previous TIAs admitted on 06/02/15 for visual changes.she was found to have left homonymous hemianopia. MRI showed right occipital lobe acute infarct, embolic pattern. MRA, CUS, TTE and LE venous doppler all negative. TEE showed PFO and loop recorder was placed. LDL 81 and A1C 7.7. She was discharged on plavix and lipitor 20mg .   08/08/15 follow up - the patient has been doing well. No recurrent stroke like symptoms. She had eye doctor appointment and vision improved to left lower quadrantanopia and was cleared for driving. She currently taking both ASA 81 and plavix 75mg . BP today 109/55 and glucose at home < 150. No other complains.  11/13/15 follow up - pt has been doing well without recurrent stroke like symptoms. Her loop recorder showed afib on 09/05/15 and was put on eliquis by cardiology. However, the eliquis dose was 2.5mg  bid. She tolerated well without bleeding side effects. Still has left lower quadrantanopia but resumed driving without problem. BP today 105/68. Glucose at home 120s. Has follow up schedule with ophthalmology.   Interval History During the interval time, pt was admitted on 02/21/16 for blurry vision and dizziness. MRI showed left PCA and right cerebellar infarcts. MRA, CUS, TTE unremarkable. LDL 116 and A1c 8.8. Her cre was 2.3 on admission, and 1.48 on discharge. Her eliquis was changed to edoxadan on discharge. However, patient has to pay almost $300 for 1 month supply of edoxaban.  BP today 96/66.  REVIEW OF SYSTEMS: Full 14 system review of systems  performed and notable only for those listed below and in HPI above, all others are negative:  Constitutional:  Excessive sweating Cardiovascular:  Ear/Nose/Throat:   Skin:  Eyes:  Blurry vision Respiratory:   Gastroitestinal:   Genitourinary:  Hematology/Lymphatic:   Endocrine:  Excessive thirst Musculoskeletal:   Allergy/Immunology:   Neurological:   Psychiatric:  Sleep: restless leg  The following represents the patient's updated allergies and side effects list: No Known Allergies  The neurologically relevant items on the patient's problem list were reviewed on today's visit.  Neurologic Examination  A problem focused neurological exam (12 or more points of the single system neurologic examination, vital signs counts as 1 point, cranial nerves count for 8 points) was performed.  Blood pressure 96/66, pulse 81, height 5\' 3"  (1.6 m), weight 204 lb 3.2 oz (92.6 kg).  General - Well nourished, well developed, in no apparent distress.  Ophthalmologic - Fundi not visualized due to eye movement.  Cardiovascular - Regular rate and rhythm.  Mental Status -  Level of arousal and orientation to time, place, and person were intact. Language including expression, naming, repetition, comprehension was assessed and found intact. Fund of Knowledge was assessed and was intact.  Cranial Nerves II - XII - II - Visual field exam showed left lower quadrantanopia. III, IV, VI - Extraocular movements intact. V - Facial sensation intact bilaterally. VII - Facial movement intact bilaterally. VIII - Hearing & vestibular intact bilaterally. X - Palate elevates symmetrically. XI - Chin turning & shoulder shrug intact bilaterally. XII - Tongue protrusion intact.  Motor Strength - The patient's  strength was normal in all extremities and pronator drift was absent.  Bulk was normal and fasciculations were absent.   Motor Tone - Muscle tone was assessed at the neck and appendages and was  normal.  Reflexes - The patient's reflexes were 1+ in all extremities and she had no pathological reflexes.  Sensory - Light touch, temperature/pinprick were assessed and were normal.    Coordination - The patient had normal movements in the hands and feet with no ataxia or dysmetria.  Tremor was absent  Gait and Station - slow wide based gait, no fall tendency.   Data reviewed: I personally reviewed the images and agree with the radiology interpretations.  Dg Chest 2 View 06/02/2015  No active cardiopulmonary disease.   Ct Head Wo Contrast 06/02/2015  1. Acute nonhemorrhagic stroke involving the right occipital lobe.  2. Remote stroke involving the subcortical white matter of the right posterior parietal lobe as noted on the MRI in October, 2012.   Mri & Mra Head and Mra Neck Wo Contrast 06/02/2015  1. Acute nonhemorrhagic infarct within the medial right occipital lobe measures 2.5 cm maximally.  2. Remote lacunar infarcts of the basal ganglia and cerebellum bilaterally.  3. Mild age advanced atrophy and white matter disease.  4. Intracranial atherosclerotic changes with mild distal branch vessel irregularity. There is no significant proximal stenosis, aneurysm, or branch vessel occlusion.  5. Mild atherosclerotic changes at the carotid bifurcations bilaterally without a significant stenosis.  6. Flow is antegrade in the vertebral arteries.   Carotid Doppler  There is 1-39% bilateral ICA stenosis. Vertebral artery flow is antegrade.   LE venous doppler - negative for DVT  2D echo  - Left ventricle: The cavity size was normal. There was moderate concentric hypertrophy. Systolic function was vigorous. The estimated ejection fraction was in the range of 65% to 70%. Wall motion was normal; there were no regional wall motion abnormalities. There was an increased relative contribution of atrial contraction to ventricular filling. Doppler parameters are consistent with  abnormal left ventricular relaxation (grade 1 diastolic dysfunction). Doppler parameters are consistent with high ventricular filling pressure. - Aortic valve: Moderate diffuse thickening and calcification, consistent with sclerosis. - Mitral valve: Calcified annulus. Mild diffuse thickening and calcification of the anterior leaflet and posterior leaflet.  TEE - Left Ventrical: Normal LV  Mitral Valve: trace - mild MR, Calcified. No significant stenosis  Aortic Valve: calcified. No AS or AI Tricuspid Valve: mild TR  Pulmonic Valve: no PI Left Atrium/ Left atrial appendage: no thrombi Atrial septum: aneurismal, + PFO, Significant right to left flow with deep breath  Aorta: mild - moderate plaque  Loop recorder - afib found on 09/05/15  Ct Head Wo Contrast 02/21/2016 IMPRESSION: 1. No acute intracranial findings. 2. Expected evolution of previously demonstrated right occipital stroke Electronically Signed   By: Richardean Sale M.D.   On: 02/21/2016 20:04   Mri Brain Wo Contrast 02/22/2016 IMPRESSION: 1. Acute ischemia within the left occipital lobe and within the right cerebellum. Multiple vascular territory involvement suggests a central embolic process. 2. No hemorrhage, midline shift or mass effect. 3. Old right occipital lobe infarct and chronic microvascular ischemia.   Mr Lovenia Kim 02/22/2016 IMPRESSION: Negative    2D Echo 02/22/16 - Left ventricle: The cavity size was normal. Wall thickness wasincreased in a pattern of moderate LVH. Systolic function wasnormal. The estimated ejection fraction was in the range of 55%to 60%. Wall motion was normal; there were no regional wallmotion abnormalities.  Doppler parameters are consistent withabnormal left ventricular relaxation (grade 1 diastolicdysfunction). - Mitral valve: Moderately to severely calcified annulus. - Left atrium: The atrium was severely dilated. - Right ventricle: The cavity size was mildly  dilated. Impressions:   No cardiac source of embolism was identified, but cannot be ruledout on the basis of this examination.  CUS 02/22/16  There is 1-39% bilateral ICA stenosis. Vertebral artery flow is antegrade.    Component     Latest Ref Rng 06/03/2015  Cholesterol     0 - 200 mg/dL 198  Triglycerides     <150 mg/dL 373 (H)  HDL Cholesterol     >40 mg/dL 42  Total CHOL/HDL Ratio      4.7  VLDL     0 - 40 mg/dL 75 (H)  LDL (calc)     0 - 99 mg/dL 81  Hemoglobin A1C     4.8 - 5.6 % 7.7 (H)  Mean Plasma Glucose      174    Assessment: As you may recall, she is a 83 y.o. Caucasian female with PMH of hypertension, hyperlipidemia, chronic kidney disease, diabetes mellitus, and previous TIAs admitted on 06/02/15 for left homonymous hemianopia. MRI showed right occipital lobe acute infarct, embolic pattern. MRA, CUS, TTE and LE venous doppler all negative. TEE showed PFO and loop recorder was placed. LDL 81 and A1C 7.7. She was discharged on plavix and lipitor 20mg . During the interval time, vision improved to left lower quadrantanopia and was cleared for driving by ophthalmology. Loop showed afib, and was put on afib. However, only on 2.5mg  bid dosing. Cre fluctuate at 1.5. On 02/21/16 she was admitted for left PCA and right cerebellar infarcts. MRA, CUS and TTE negative. LDL 116 and A1C 8.8. Eliquis changed to edoxaban. Her Cre on admission 2.30 but on discharge 1.48. However, pt can not afford edoxaban. Due to recurrent stroke on lower dose of eliquis, Cre around 1.50, will increase eliquis to 5mg  bid in place of edoxaban and for better stroke prevention.   Plan:  - continue to finish the edoxaban  - once finish off edoxaban, will switch to eliquis 5mg  bid for better stroke prevention.  - continue lipitor for HLD and stroke prevention - Follow up with your primary care physician for stroke risk factor modification. Recommend maintain blood pressure goal <130/80, diabetes with  hemoglobin A1c goal below 6.5% and lipids with LDL cholesterol goal below 70 mg/dL.  - follow up with ophthalmology Dr. Katy Fitch as scheduled.  - check BP and glucose at home and record - diabetic diet and regular exercise - adequate fluid intake for BP and kidney function  - follow up in 3 months.    I spent more than 25 minutes of face to face time with the patient. Greater than 50% of time was spent in counseling and coordination of care. We discussed edoxaban change to eliquis, and eliquis doing management, follow up with eye doctor.   No orders of the defined types were placed in this encounter.   Meds ordered this encounter  Medications  . apixaban (ELIQUIS) 5 MG TABS tablet    Sig: Take 1 tablet (5 mg total) by mouth 2 (two) times daily.    Dispense:  60 tablet    Refill:  5    Patient Instructions  - continue to finish the edoxaban  - once finish off edoxaban, will switch to eliquis 5mg  bid for better stroke prevention.  - continue lipitor for HLD and  stroke prevention - Follow up with your primary care physician for stroke risk factor modification. Recommend maintain blood pressure goal <130/80, diabetes with hemoglobin A1c goal below 6.5% and lipids with LDL cholesterol goal below 70 mg/dL.  - follow up with ophthalmology Dr. Katy Fitch as scheduled.  - check BP and glucose at home and record - diabetic diet and regular exercise - adequate fluid intake to help avoid low BP and improve kidney function  - follow up in 3 months.   Rosalin Hawking, MD PhD [facility] [address] [place], [place] [address] [phone]

## 2016-03-04 NOTE — Patient Instructions (Addendum)
-   continue to finish the edoxaban  - once finish off edoxaban, will switch to eliquis 5mg  bid for better stroke prevention.  - continue lipitor for HLD and stroke prevention - Follow up with your primary care physician for stroke risk factor modification. Recommend maintain blood pressure goal <130/80, diabetes with hemoglobin A1c goal below 6.5% and lipids with LDL cholesterol goal below 70 mg/dL.  - follow up with ophthalmology Dr. Katy Fitch as scheduled.  - check BP and glucose at home and record - diabetic diet and regular exercise - adequate fluid intake to help avoid low BP and improve kidney function  - follow up in 3 months.

## 2016-03-05 NOTE — Progress Notes (Signed)
Carelink Summary Report / Loop Recorder 

## 2016-03-08 DIAGNOSIS — I10 Essential (primary) hypertension: Secondary | ICD-10-CM | POA: Diagnosis not present

## 2016-03-08 DIAGNOSIS — I480 Paroxysmal atrial fibrillation: Secondary | ICD-10-CM | POA: Diagnosis not present

## 2016-03-08 DIAGNOSIS — Z6838 Body mass index (BMI) 38.0-38.9, adult: Secondary | ICD-10-CM | POA: Diagnosis not present

## 2016-03-08 DIAGNOSIS — Z8673 Personal history of transient ischemic attack (TIA), and cerebral infarction without residual deficits: Secondary | ICD-10-CM | POA: Diagnosis not present

## 2016-03-23 LAB — CUP PACEART REMOTE DEVICE CHECK
Date Time Interrogation Session: 20171208213844
MDC IDC PG IMPLANT DT: 20170412

## 2016-03-23 NOTE — Progress Notes (Signed)
Carelink summary report received. Battery status OK. Normal device function. No new symptom episodes, brady, or pause episodes. 54 AF 0.4% +savaysa ECGs: some appear AF, others SR w/ PACs/PVCs. (recent CVA on Eliquis per EPIC) 1 tachy- ECG appears reg. VS. Monthly summary reports and ROV/PRN

## 2016-04-02 ENCOUNTER — Ambulatory Visit (INDEPENDENT_AMBULATORY_CARE_PROVIDER_SITE_OTHER): Payer: Medicare HMO | Admitting: *Deleted

## 2016-04-02 DIAGNOSIS — Z4509 Encounter for adjustment and management of other cardiac device: Secondary | ICD-10-CM | POA: Diagnosis not present

## 2016-04-03 NOTE — Progress Notes (Signed)
Carelink Summary Report / Loop Recorder 

## 2016-04-16 LAB — CUP PACEART REMOTE DEVICE CHECK
Implantable Pulse Generator Implant Date: 20170412
MDC IDC SESS DTM: 20180107223955

## 2016-04-25 LAB — CUP PACEART REMOTE DEVICE CHECK
Date Time Interrogation Session: 20180206223851
Implantable Pulse Generator Implant Date: 20170412

## 2016-04-25 NOTE — Progress Notes (Signed)
Carelink summary report received. Battery status OK. Normal device function. No new symptom episodes, tachy episodes, brady, or pause episodes. 1 AF 0% Savaysa changed to Eliquis per neuro. note. Monthly summary reports and ROV/PRN

## 2016-05-02 ENCOUNTER — Ambulatory Visit (INDEPENDENT_AMBULATORY_CARE_PROVIDER_SITE_OTHER): Payer: Medicare HMO | Admitting: *Deleted

## 2016-05-02 DIAGNOSIS — I639 Cerebral infarction, unspecified: Secondary | ICD-10-CM | POA: Diagnosis not present

## 2016-05-03 NOTE — Progress Notes (Signed)
Carelink Summary Report / Loop Recorder 

## 2016-05-08 DIAGNOSIS — E119 Type 2 diabetes mellitus without complications: Secondary | ICD-10-CM | POA: Diagnosis not present

## 2016-05-08 DIAGNOSIS — E785 Hyperlipidemia, unspecified: Secondary | ICD-10-CM | POA: Diagnosis not present

## 2016-05-08 DIAGNOSIS — I10 Essential (primary) hypertension: Secondary | ICD-10-CM | POA: Diagnosis not present

## 2016-05-08 DIAGNOSIS — Z79899 Other long term (current) drug therapy: Secondary | ICD-10-CM | POA: Diagnosis not present

## 2016-05-08 DIAGNOSIS — M109 Gout, unspecified: Secondary | ICD-10-CM | POA: Diagnosis not present

## 2016-05-08 DIAGNOSIS — I6789 Other cerebrovascular disease: Secondary | ICD-10-CM | POA: Diagnosis not present

## 2016-05-09 DIAGNOSIS — L308 Other specified dermatitis: Secondary | ICD-10-CM | POA: Diagnosis not present

## 2016-05-13 DIAGNOSIS — I10 Essential (primary) hypertension: Secondary | ICD-10-CM | POA: Diagnosis not present

## 2016-05-13 DIAGNOSIS — Z6838 Body mass index (BMI) 38.0-38.9, adult: Secondary | ICD-10-CM | POA: Diagnosis not present

## 2016-05-13 DIAGNOSIS — Z0001 Encounter for general adult medical examination with abnormal findings: Secondary | ICD-10-CM | POA: Diagnosis not present

## 2016-05-13 DIAGNOSIS — E1122 Type 2 diabetes mellitus with diabetic chronic kidney disease: Secondary | ICD-10-CM | POA: Diagnosis not present

## 2016-05-13 DIAGNOSIS — N184 Chronic kidney disease, stage 4 (severe): Secondary | ICD-10-CM | POA: Diagnosis not present

## 2016-05-13 DIAGNOSIS — I480 Paroxysmal atrial fibrillation: Secondary | ICD-10-CM | POA: Diagnosis not present

## 2016-05-16 LAB — CUP PACEART REMOTE DEVICE CHECK
Implantable Pulse Generator Implant Date: 20170412
MDC IDC SESS DTM: 20180308230843

## 2016-05-20 DIAGNOSIS — B078 Other viral warts: Secondary | ICD-10-CM | POA: Diagnosis not present

## 2016-05-20 DIAGNOSIS — L820 Inflamed seborrheic keratosis: Secondary | ICD-10-CM | POA: Diagnosis not present

## 2016-05-22 ENCOUNTER — Encounter: Payer: Self-pay | Admitting: Internal Medicine

## 2016-06-03 ENCOUNTER — Ambulatory Visit (INDEPENDENT_AMBULATORY_CARE_PROVIDER_SITE_OTHER): Payer: Medicare HMO | Admitting: *Deleted

## 2016-06-03 ENCOUNTER — Ambulatory Visit (INDEPENDENT_AMBULATORY_CARE_PROVIDER_SITE_OTHER): Payer: Medicare HMO | Admitting: Internal Medicine

## 2016-06-03 VITALS — BP 112/54 | HR 86 | Ht 63.0 in | Wt 209.0 lb

## 2016-06-03 DIAGNOSIS — I10 Essential (primary) hypertension: Secondary | ICD-10-CM

## 2016-06-03 DIAGNOSIS — I480 Paroxysmal atrial fibrillation: Secondary | ICD-10-CM

## 2016-06-03 DIAGNOSIS — I639 Cerebral infarction, unspecified: Secondary | ICD-10-CM | POA: Diagnosis not present

## 2016-06-03 NOTE — Patient Instructions (Signed)
Medication Instructions: - Your physician recommends that you continue on your current medications as directed. Please refer to the Current Medication list given to you today.  Labwork: - Your physician recommends that you have lab work today: [employer]  Procedures/Testing: - none ordered  Follow-Up: - Your physician wants you to follow-up in: 1 year with Dr. Caryl Comes. You will receive a reminder letter in the mail two months in advance. If you don't receive a letter, please call our office to schedule the follow-up appointment.  Any Additional Special Instructions Will Be Listed Below (If Applicable).     If you need a refill on your cardiac medications before your next appointment, please call your pharmacy.

## 2016-06-03 NOTE — Progress Notes (Signed)
Patient Care Team: Asencion Noble, MD as PCP - General (Internal Medicine)   HPI  Amy Wiggins is a 83 y.o. female Seen in follow-up for recorder placement 4/17 for cryptogenic stroke  She has been diagnosed with atrial fibrillation. She is on ELIQUIS.  Creatinine has ranged from 6.60-6.3  Thromboembolic risk profile is notable for age-26 stroke-2 hypertension-1 diabetes-1 gender-1 for a CHADS-VASc score of greater than or equal to 7  She has blurry vision and chronic shortness of breath     Night sweats   Past Medical History:  Diagnosis Date  . Depression   . Diabetes mellitus   . Hypertension   . PAF (paroxysmal atrial fibrillation) ([place])   . Stroke ([place])    TIA's x 2    Past Surgical History:  Procedure Laterality Date  . COLONOSCOPY    . COLONOSCOPY N/A 09/09/2014   Procedure: COLONOSCOPY;  Surgeon: Rogene Houston, MD;  Location: [facility];  Service: Endoscopy;  Laterality: N/A;  1010 - moved to 7:30 - Ann to notify  . EP IMPLANTABLE DEVICE N/A 06/07/2015   Procedure: Loop Recorder Insertion;  Surgeon: Deboraha Sprang, MD;  Location: [facility];  Service: Cardiovascular;  Laterality: N/A;  . TEE WITHOUT CARDIOVERSION N/A 06/07/2015   Procedure: TRANSESOPHAGEAL ECHOCARDIOGRAM (TEE);  Surgeon: Thayer Headings, MD;  Location: [facility] ENDOSCOPY;  Service: Cardiovascular;  Laterality: N/A;    Current Outpatient Prescriptions  Medication Sig Dispense Refill  . acetaminophen (TYLENOL) 500 MG tablet Take 1,000 mg by mouth See admin instructions. Take 2 tablets (1000 mg) by mouth daily at bedtime, may also take 2 tablets during the day as needed for pain    . apixaban (ELIQUIS) 5 MG TABS tablet Take 1 tablet (5 mg total) by mouth 2 (two) times daily. 60 tablet 5  . atorvastatin (LIPITOR) 20 MG tablet Take 2 tablets (40 mg total) by mouth every evening. 4pm 60 tablet 0  . chlorthalidone (HYGROTON) 25 MG tablet Take 25 mg by mouth daily. For high blood pressure/fluid     . edoxaban (SAVAYSA) 30 MG TABS tablet Take 1 tablet (30 mg total) by mouth daily. 30 tablet 1  . glimepiride (AMARYL) 2 MG tablet Take 2 mg by mouth daily with breakfast. For Diabetes    . Liraglutide (VICTOZA) 18 MG/3ML SOPN Inject 1.8 mg into the skin at bedtime.     Marland Kitchen losartan (COZAAR) 100 MG tablet Take 100 mg by mouth every evening. For high blood pressure - 4pm    . magnesium oxide (MAG-OX) 400 MG tablet Take 400 mg by mouth every evening. 4pm    . metoprolol tartrate (LOPRESSOR) 25 MG tablet Take 1 tablet (25 mg total) by mouth 2 (two) times daily. 30 tablet 6  . Multiple Vitamin (MULTIVITAMIN WITH MINERALS) TABS tablet Take 1 tablet by mouth at bedtime. For supplement    . Multiple Vitamins-Minerals (PRESERVISION AREDS 2) CAPS Take 1 capsule by mouth 2 (two) times daily.    . pantoprazole (PROTONIX) 40 MG tablet Take 40 mg by mouth daily. For acid reflux/gerd    . pioglitazone (ACTOS) 15 MG tablet Take 15 mg by mouth every evening. 4pm    . traZODone (DESYREL) 100 MG tablet Take 100 mg by mouth at bedtime.    Marland Kitchen venlafaxine XR (EFFEXOR-XR) 75 MG 24 hr capsule Take 75 mg by mouth daily with breakfast.     No current facility-administered medications for this visit.  No Known Allergies    Review of Systems negative except from HPI and PMH  Physical Exam BP (!) 112/54   Pulse 86   Ht 5\' 3"  (1.6 m)   Wt 209 lb (94.8 kg)   SpO2 98%   BMI 37.02 kg/m  Well developed and well nourished in no acute distress HENT normal E scleral and icterus clear Neck Supple JVP flat; carotids brisk and full Clear to ausculation Regular rate and rhythm, no murmurs gallops or rub Soft with active bowel sounds No clubbing cyanosis Edema Alert and oriented, grossly normal motor and sensory function Skin Warm and Dry  ECG  Sinus 84 19/09/38 LAD -38  Assessment and  Plan  Atrial fibrillation  HTN  Loop recorder  Chronic renal insufficiency  Depression   The patient has  frequent episodes of atrial fibrillation.  She is on anticoagulation. Her renal insufficiency might dictated a different apixaban dose. We will check it today.  Blood pressure is well-controlled.  She is being weaned off her Effexor. This may be contributory to her night sweats.      Current medicines are reviewed at length with the patient today .  The patient does not have concerns regarding medicines.

## 2016-06-04 LAB — BASIC METABOLIC PANEL
BUN / CREAT RATIO: 22 (ref 12–28)
BUN: 34 mg/dL — ABNORMAL HIGH (ref 8–27)
CHLORIDE: 99 mmol/L (ref 96–106)
CO2: 29 mmol/L (ref 18–29)
Calcium: 10.2 mg/dL (ref 8.7–10.3)
Creatinine, Ser: 1.52 mg/dL — ABNORMAL HIGH (ref 0.57–1.00)
GFR calc non Af Amer: 32 mL/min/{1.73_m2} — ABNORMAL LOW (ref >59)
GFR, EST AFRICAN AMERICAN: 37 mL/min/{1.73_m2} — AB (ref >59)
Glucose: 116 mg/dL — ABNORMAL HIGH (ref 65–99)
POTASSIUM: 4 mmol/L (ref 3.5–5.2)
Sodium: 143 mmol/L (ref 134–144)

## 2016-06-04 LAB — CUP PACEART INCLINIC DEVICE CHECK
Date Time Interrogation Session: 20180410201720
MDC IDC PG IMPLANT DT: 20170412

## 2016-06-04 NOTE — Progress Notes (Signed)
Carelink Summary Report / Loop Recorder 

## 2016-06-10 ENCOUNTER — Telehealth: Payer: Self-pay | Admitting: Internal Medicine

## 2016-06-10 DIAGNOSIS — I639 Cerebral infarction, unspecified: Secondary | ICD-10-CM

## 2016-06-10 LAB — CUP PACEART REMOTE DEVICE CHECK
Date Time Interrogation Session: 20180407234408
MDC IDC PG IMPLANT DT: 20170412

## 2016-06-10 MED ORDER — APIXABAN 5 MG PO TABS: ORAL_TABLET | ORAL | Status: DC

## 2016-06-10 NOTE — Telephone Encounter (Signed)
I called the patient with her most recent lab results. Will need to decrease eliquis to 2.5 mg BID per Dr. Caryl Comes. The patient has lots of the 5 mg tablets- ok per pharmacy to cut these in half. The patient is aware to take eliquis 5 mg 1/2 tablet (2.5 mg) by mouth twice daily

## 2016-06-10 NOTE — Progress Notes (Signed)
Carelink summary report received. Battery status OK. Normal device function. No new symptom episodes, tachy episodes, brady, or pause episodes. No new AF episodes. Monthly summary reports and ROV/PRN 

## 2016-07-01 ENCOUNTER — Ambulatory Visit (INDEPENDENT_AMBULATORY_CARE_PROVIDER_SITE_OTHER): Payer: Medicare HMO | Admitting: *Deleted

## 2016-07-01 DIAGNOSIS — I639 Cerebral infarction, unspecified: Secondary | ICD-10-CM

## 2016-07-02 NOTE — Progress Notes (Signed)
Carelink Summary Report / Loop Recorder 

## 2016-07-03 DIAGNOSIS — E119 Type 2 diabetes mellitus without complications: Secondary | ICD-10-CM | POA: Diagnosis not present

## 2016-07-03 DIAGNOSIS — Z961 Presence of intraocular lens: Secondary | ICD-10-CM | POA: Diagnosis not present

## 2016-07-03 DIAGNOSIS — H353131 Nonexudative age-related macular degeneration, bilateral, early dry stage: Secondary | ICD-10-CM | POA: Diagnosis not present

## 2016-07-03 DIAGNOSIS — I63411 Cerebral infarction due to embolism of right middle cerebral artery: Secondary | ICD-10-CM | POA: Diagnosis not present

## 2016-07-10 DIAGNOSIS — M109 Gout, unspecified: Secondary | ICD-10-CM | POA: Diagnosis not present

## 2016-07-10 DIAGNOSIS — R609 Edema, unspecified: Secondary | ICD-10-CM | POA: Diagnosis not present

## 2016-07-10 DIAGNOSIS — F339 Major depressive disorder, recurrent, unspecified: Secondary | ICD-10-CM | POA: Diagnosis not present

## 2016-07-16 LAB — CUP PACEART REMOTE DEVICE CHECK
MDC IDC PG IMPLANT DT: 20170412
MDC IDC SESS DTM: 20180507234115

## 2016-07-31 ENCOUNTER — Ambulatory Visit (INDEPENDENT_AMBULATORY_CARE_PROVIDER_SITE_OTHER): Payer: Medicare HMO | Admitting: *Deleted

## 2016-07-31 DIAGNOSIS — I639 Cerebral infarction, unspecified: Secondary | ICD-10-CM

## 2016-08-01 DIAGNOSIS — F339 Major depressive disorder, recurrent, unspecified: Secondary | ICD-10-CM | POA: Diagnosis not present

## 2016-08-01 DIAGNOSIS — R609 Edema, unspecified: Secondary | ICD-10-CM | POA: Diagnosis not present

## 2016-08-02 NOTE — Progress Notes (Signed)
Carelink Summary Report / Loop Recorder 

## 2016-08-05 LAB — CUP PACEART REMOTE DEVICE CHECK
Implantable Pulse Generator Implant Date: 20170412
MDC IDC SESS DTM: 20180606233726

## 2016-08-05 NOTE — Progress Notes (Signed)
Carelink summary report received. Battery status OK. Normal device function. No new symptom episodes, tachy episodes, or brady episodes. No new AF episodes. 2 pause- ECGs appear true pause, longest 4 sec's. Both nocturnal, no symptoms reported per EPIC. Monthly summary reports and ROV/PRN

## 2016-08-30 ENCOUNTER — Ambulatory Visit (INDEPENDENT_AMBULATORY_CARE_PROVIDER_SITE_OTHER): Payer: Medicare HMO | Admitting: *Deleted

## 2016-08-30 DIAGNOSIS — I639 Cerebral infarction, unspecified: Secondary | ICD-10-CM | POA: Diagnosis not present

## 2016-09-02 NOTE — Progress Notes (Signed)
Carelink Summary Report / Loop Recorder 

## 2016-09-05 LAB — CUP PACEART REMOTE DEVICE CHECK
Date Time Interrogation Session: 20180707001341
MDC IDC PG IMPLANT DT: 20170412

## 2016-09-09 DIAGNOSIS — Z79899 Other long term (current) drug therapy: Secondary | ICD-10-CM | POA: Diagnosis not present

## 2016-09-09 DIAGNOSIS — I10 Essential (primary) hypertension: Secondary | ICD-10-CM | POA: Diagnosis not present

## 2016-09-09 DIAGNOSIS — E119 Type 2 diabetes mellitus without complications: Secondary | ICD-10-CM | POA: Diagnosis not present

## 2016-09-11 ENCOUNTER — Other Ambulatory Visit (HOSPITAL_COMMUNITY): Payer: Self-pay | Admitting: Internal Medicine

## 2016-09-11 DIAGNOSIS — E1129 Type 2 diabetes mellitus with other diabetic kidney complication: Secondary | ICD-10-CM | POA: Diagnosis not present

## 2016-09-11 DIAGNOSIS — F339 Major depressive disorder, recurrent, unspecified: Secondary | ICD-10-CM | POA: Diagnosis not present

## 2016-09-11 DIAGNOSIS — K7460 Unspecified cirrhosis of liver: Secondary | ICD-10-CM

## 2016-09-11 DIAGNOSIS — N184 Chronic kidney disease, stage 4 (severe): Secondary | ICD-10-CM | POA: Diagnosis not present

## 2016-09-11 DIAGNOSIS — R188 Other ascites: Secondary | ICD-10-CM

## 2016-09-11 DIAGNOSIS — R140 Abdominal distension (gaseous): Secondary | ICD-10-CM

## 2016-09-17 ENCOUNTER — Ambulatory Visit (HOSPITAL_COMMUNITY)
Admission: RE | Admit: 2016-09-17 | Discharge: 2016-09-17 | Disposition: A | Discharge status: Home / Self Care | Payer: Medicare HMO | Source: Ambulatory Visit | Attending: Internal Medicine | Admitting: Internal Medicine

## 2016-09-17 DIAGNOSIS — R188 Other ascites: Secondary | ICD-10-CM

## 2016-09-17 DIAGNOSIS — K7460 Unspecified cirrhosis of liver: Secondary | ICD-10-CM

## 2016-09-17 DIAGNOSIS — R140 Abdominal distension (gaseous): Secondary | ICD-10-CM | POA: Diagnosis not present

## 2016-09-23 DIAGNOSIS — H353221 Exudative age-related macular degeneration, left eye, with active choroidal neovascularization: Secondary | ICD-10-CM | POA: Diagnosis not present

## 2016-09-23 DIAGNOSIS — Z961 Presence of intraocular lens: Secondary | ICD-10-CM | POA: Diagnosis not present

## 2016-09-23 DIAGNOSIS — H353131 Nonexudative age-related macular degeneration, bilateral, early dry stage: Secondary | ICD-10-CM | POA: Diagnosis not present

## 2016-09-23 DIAGNOSIS — E119 Type 2 diabetes mellitus without complications: Secondary | ICD-10-CM | POA: Diagnosis not present

## 2016-09-26 DIAGNOSIS — H353221 Exudative age-related macular degeneration, left eye, with active choroidal neovascularization: Secondary | ICD-10-CM | POA: Diagnosis not present

## 2016-09-30 ENCOUNTER — Ambulatory Visit (INDEPENDENT_AMBULATORY_CARE_PROVIDER_SITE_OTHER): Payer: Medicare HMO | Admitting: *Deleted

## 2016-09-30 DIAGNOSIS — I639 Cerebral infarction, unspecified: Secondary | ICD-10-CM | POA: Diagnosis not present

## 2016-10-01 NOTE — Progress Notes (Signed)
Carelink Summary Report / Loop Recorder 

## 2016-10-09 LAB — CUP PACEART REMOTE DEVICE CHECK
Date Time Interrogation Session: 20180806003909
MDC IDC PG IMPLANT DT: 20170412

## 2016-10-22 DIAGNOSIS — F330 Major depressive disorder, recurrent, mild: Secondary | ICD-10-CM | POA: Diagnosis not present

## 2016-10-22 DIAGNOSIS — E1129 Type 2 diabetes mellitus with other diabetic kidney complication: Secondary | ICD-10-CM | POA: Diagnosis not present

## 2016-10-29 ENCOUNTER — Ambulatory Visit (INDEPENDENT_AMBULATORY_CARE_PROVIDER_SITE_OTHER): Payer: Medicare HMO | Admitting: *Deleted

## 2016-10-29 DIAGNOSIS — I639 Cerebral infarction, unspecified: Secondary | ICD-10-CM

## 2016-10-29 DIAGNOSIS — H353221 Exudative age-related macular degeneration, left eye, with active choroidal neovascularization: Secondary | ICD-10-CM | POA: Diagnosis not present

## 2016-10-31 NOTE — Progress Notes (Signed)
Carelink Summary Report / Loop Recorder 

## 2016-11-04 LAB — CUP PACEART REMOTE DEVICE CHECK
Implantable Pulse Generator Implant Date: 20170412
MDC IDC SESS DTM: 20180905014128

## 2016-11-28 ENCOUNTER — Ambulatory Visit (INDEPENDENT_AMBULATORY_CARE_PROVIDER_SITE_OTHER): Payer: Medicare HMO | Admitting: *Deleted

## 2016-11-28 DIAGNOSIS — I639 Cerebral infarction, unspecified: Secondary | ICD-10-CM | POA: Diagnosis not present

## 2016-12-03 DIAGNOSIS — Z23 Encounter for immunization: Secondary | ICD-10-CM | POA: Diagnosis not present

## 2016-12-03 DIAGNOSIS — F330 Major depressive disorder, recurrent, mild: Secondary | ICD-10-CM | POA: Diagnosis not present

## 2016-12-03 DIAGNOSIS — E1129 Type 2 diabetes mellitus with other diabetic kidney complication: Secondary | ICD-10-CM | POA: Diagnosis not present

## 2016-12-04 NOTE — Progress Notes (Signed)
Carelink Summary Report / Loop Recorder 

## 2016-12-09 LAB — CUP PACEART REMOTE DEVICE CHECK
Date Time Interrogation Session: 20181005013952
MDC IDC PG IMPLANT DT: 20170412

## 2016-12-18 DIAGNOSIS — H353221 Exudative age-related macular degeneration, left eye, with active choroidal neovascularization: Secondary | ICD-10-CM | POA: Diagnosis not present

## 2016-12-30 ENCOUNTER — Ambulatory Visit (INDEPENDENT_AMBULATORY_CARE_PROVIDER_SITE_OTHER): Payer: Medicare HMO | Admitting: *Deleted

## 2016-12-30 DIAGNOSIS — I639 Cerebral infarction, unspecified: Secondary | ICD-10-CM | POA: Diagnosis not present

## 2016-12-31 LAB — CUP PACEART REMOTE DEVICE CHECK
Date Time Interrogation Session: 20181104014210
MDC IDC PG IMPLANT DT: 20170412

## 2016-12-31 NOTE — Progress Notes (Signed)
Carelink Summary Report / Loop Recorder 

## 2017-01-14 DIAGNOSIS — Z8673 Personal history of transient ischemic attack (TIA), and cerebral infarction without residual deficits: Secondary | ICD-10-CM | POA: Diagnosis not present

## 2017-01-14 DIAGNOSIS — R42 Dizziness and giddiness: Secondary | ICD-10-CM | POA: Diagnosis not present

## 2017-01-22 DIAGNOSIS — H353221 Exudative age-related macular degeneration, left eye, with active choroidal neovascularization: Secondary | ICD-10-CM | POA: Diagnosis not present

## 2017-01-27 ENCOUNTER — Ambulatory Visit (INDEPENDENT_AMBULATORY_CARE_PROVIDER_SITE_OTHER): Payer: Medicare HMO | Admitting: *Deleted

## 2017-01-27 DIAGNOSIS — I639 Cerebral infarction, unspecified: Secondary | ICD-10-CM | POA: Diagnosis not present

## 2017-01-28 NOTE — Progress Notes (Signed)
Carelink Summary Report / Loop Recorder 

## 2017-01-31 DIAGNOSIS — R42 Dizziness and giddiness: Secondary | ICD-10-CM | POA: Diagnosis not present

## 2017-02-04 LAB — CUP PACEART REMOTE DEVICE CHECK
Date Time Interrogation Session: 20181204024007
Implantable Pulse Generator Implant Date: 20170412

## 2017-02-07 DIAGNOSIS — M25552 Pain in left hip: Secondary | ICD-10-CM | POA: Diagnosis not present

## 2017-02-07 DIAGNOSIS — E119 Type 2 diabetes mellitus without complications: Secondary | ICD-10-CM | POA: Diagnosis not present

## 2017-02-07 DIAGNOSIS — R05 Cough: Secondary | ICD-10-CM | POA: Diagnosis not present

## 2017-02-10 ENCOUNTER — Ambulatory Visit (HOSPITAL_COMMUNITY)
Admission: RE | Admit: 2017-02-10 | Discharge: 2017-02-10 | Disposition: A | Discharge status: Home / Self Care | Payer: Medicare HMO | Source: Ambulatory Visit | Attending: Internal Medicine | Admitting: Internal Medicine

## 2017-02-10 ENCOUNTER — Other Ambulatory Visit (HOSPITAL_COMMUNITY): Payer: Self-pay | Admitting: Internal Medicine

## 2017-02-10 DIAGNOSIS — R918 Other nonspecific abnormal finding of lung field: Secondary | ICD-10-CM | POA: Diagnosis not present

## 2017-02-10 DIAGNOSIS — M25552 Pain in left hip: Secondary | ICD-10-CM | POA: Diagnosis not present

## 2017-02-10 DIAGNOSIS — R05 Cough: Secondary | ICD-10-CM | POA: Diagnosis not present

## 2017-02-10 DIAGNOSIS — J9811 Atelectasis: Secondary | ICD-10-CM | POA: Insufficient documentation

## 2017-02-10 DIAGNOSIS — R059 Cough, unspecified: Secondary | ICD-10-CM

## 2017-02-26 ENCOUNTER — Ambulatory Visit (INDEPENDENT_AMBULATORY_CARE_PROVIDER_SITE_OTHER): Payer: Medicare HMO | Admitting: *Deleted

## 2017-02-26 DIAGNOSIS — I639 Cerebral infarction, unspecified: Secondary | ICD-10-CM

## 2017-02-27 DIAGNOSIS — E119 Type 2 diabetes mellitus without complications: Secondary | ICD-10-CM | POA: Diagnosis not present

## 2017-02-27 DIAGNOSIS — E785 Hyperlipidemia, unspecified: Secondary | ICD-10-CM | POA: Diagnosis not present

## 2017-02-27 DIAGNOSIS — Z79899 Other long term (current) drug therapy: Secondary | ICD-10-CM | POA: Diagnosis not present

## 2017-02-27 DIAGNOSIS — I10 Essential (primary) hypertension: Secondary | ICD-10-CM | POA: Diagnosis not present

## 2017-02-27 NOTE — Progress Notes (Signed)
Carelink Summary Report / Loop Recorder 

## 2017-02-28 DIAGNOSIS — H353221 Exudative age-related macular degeneration, left eye, with active choroidal neovascularization: Secondary | ICD-10-CM | POA: Diagnosis not present

## 2017-03-06 DIAGNOSIS — M25552 Pain in left hip: Secondary | ICD-10-CM | POA: Diagnosis not present

## 2017-03-06 DIAGNOSIS — N184 Chronic kidney disease, stage 4 (severe): Secondary | ICD-10-CM | POA: Diagnosis not present

## 2017-03-06 DIAGNOSIS — E1129 Type 2 diabetes mellitus with other diabetic kidney complication: Secondary | ICD-10-CM | POA: Diagnosis not present

## 2017-03-10 IMAGING — MR MR HEAD W/O CM
11 of 16 series · 24 of 48 positions shown · non-contrast
Comparison: CT head without contrast 06/02/2015. MRI brain
12/12/2010.

CLINICAL DATA: Right-sided headache beginning yesterday. Sent by a
eye doctor. No weakness or facial droop.



[Series 3: T1 · sagittal · 5.0mm · 0.47mm/px · 1 of 23 slices shown]
[im 1/23]
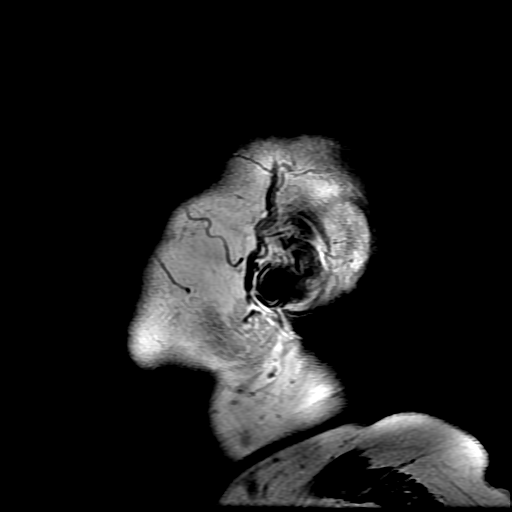

[Series 4: DWI · axial · 3.0mm · 1.09mm/px · z∈[-69,+67]mm · 3 of 94 slices shown (1 of 4)]
[im 1/94]
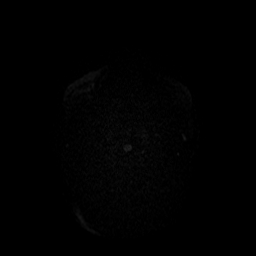
[im 47/94]
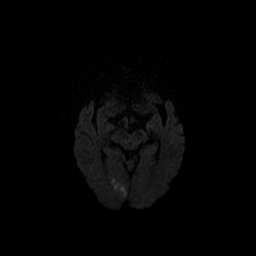
[im 94/94]
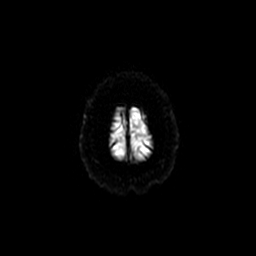

[Series 5: (id) mt fs · axial · 1.4mm · 0.43mm/px · z∈[-66,+28]mm · 5 of 136 slices shown]
[im 1/136]
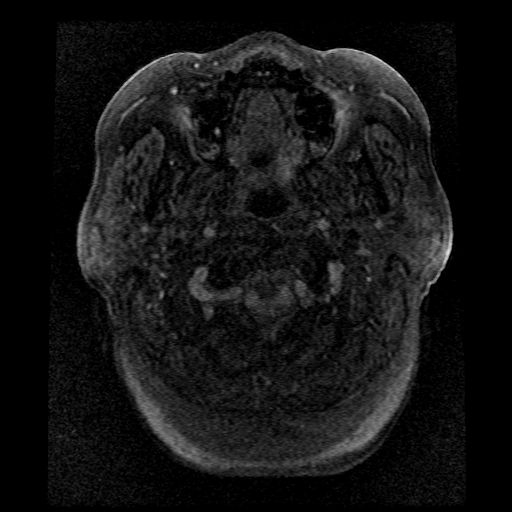
[im 34/136]
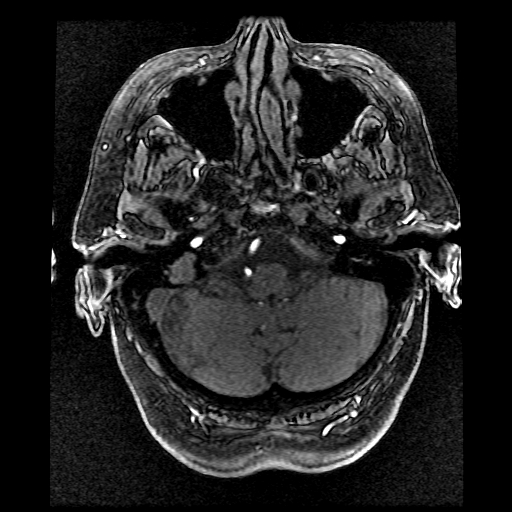
[im 68/136]
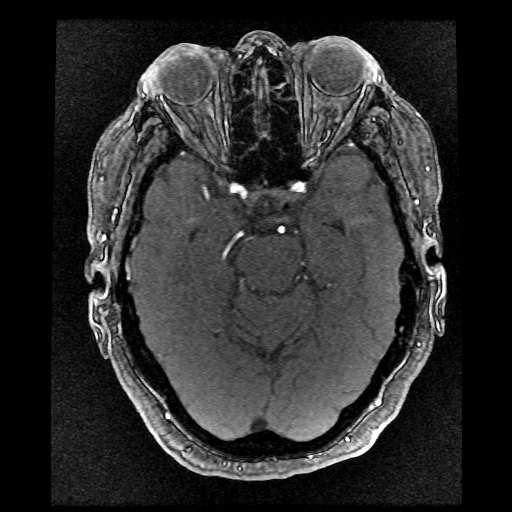
[im 102/136]
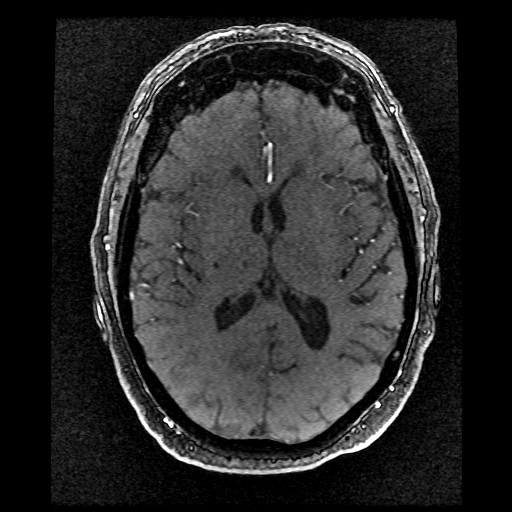
[im 136/136]
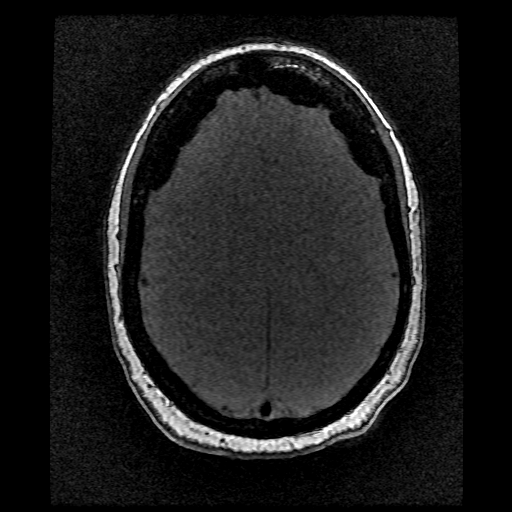

[Series 6: DWI · coronal · 5.0mm · 1.09mm/px · 3 of 66 slices shown (2 of 4)]
[im 1/66]
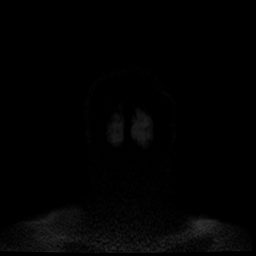
[im 33/66]
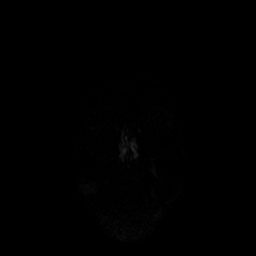
[im 66/66]
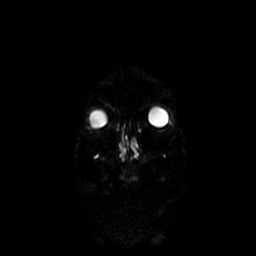

[Series 7: T2 · axial · 5.0mm · 0.43mm/px · 1 of 20 slices shown (1 of 2)]
[im 1/20]
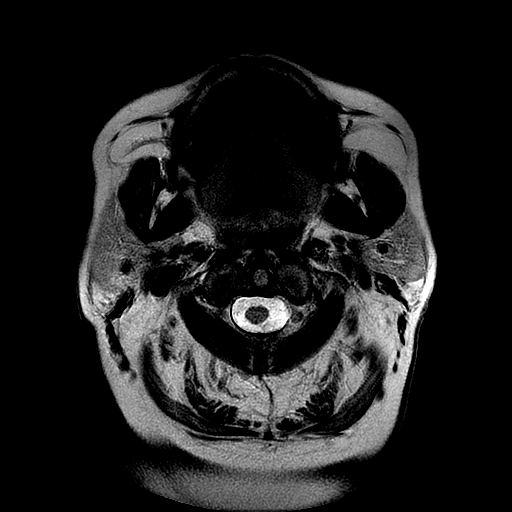

[Series 8: FLAIR · axial · 5.0mm · 0.43mm/px · 1 of 24 slices shown]
[im 1/24]
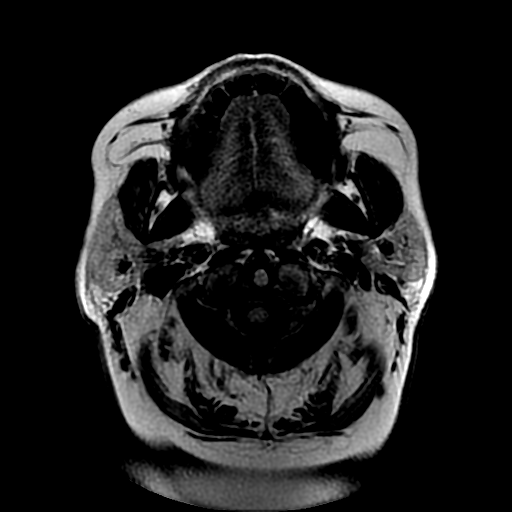

[Series 9: ax mpgr · axial · 5.0mm · 0.43mm/px · 1 of 24 slices shown]
[im 1/24]
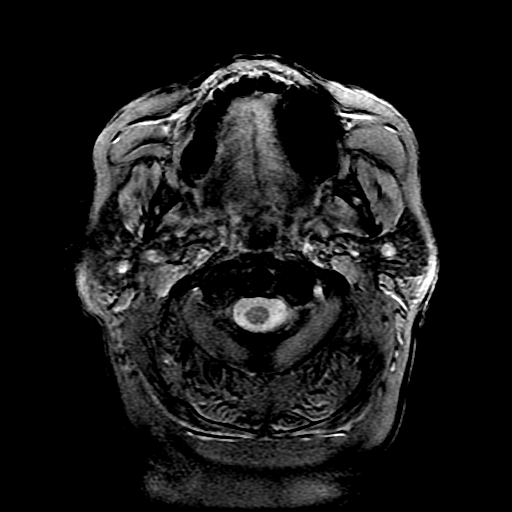

[Series 11: T2 · coronal · 5.0mm · 0.43mm/px · 1 of 28 slices shown (2 of 2)]
[im 1/28]
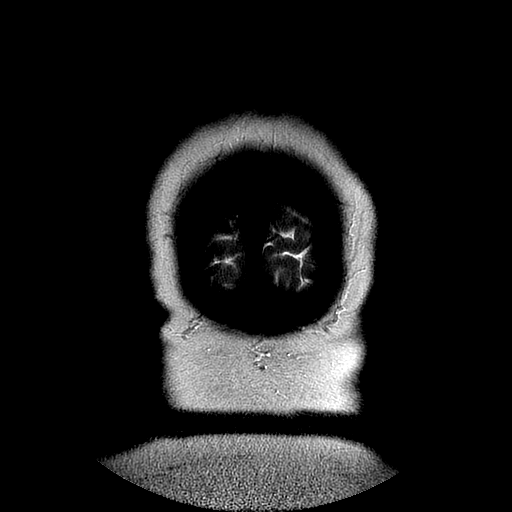

[Series 14: ax (id) · axial · 2.8mm · 0.47mm/px · z∈[-241,-91]mm · 5 of 108 slices shown]
[im 1/108]
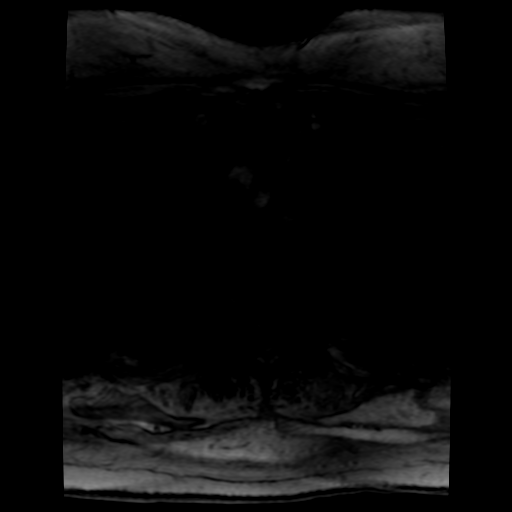
[im 27/108]
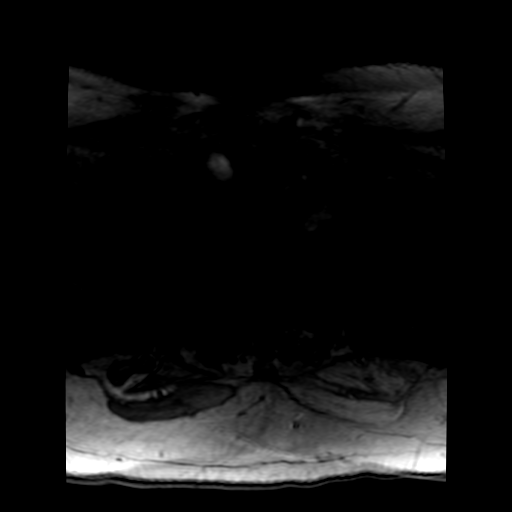
[im 54/108]
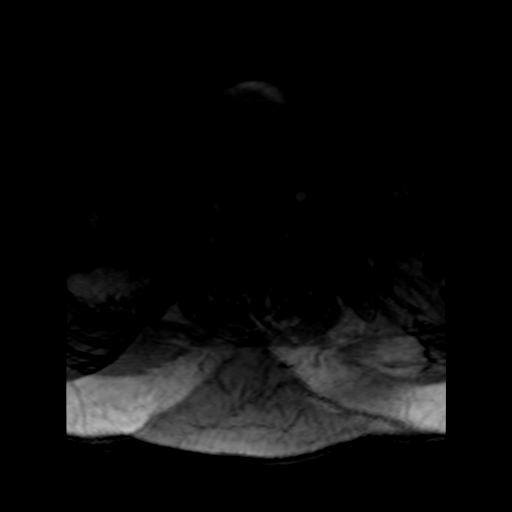
[im 81/108]
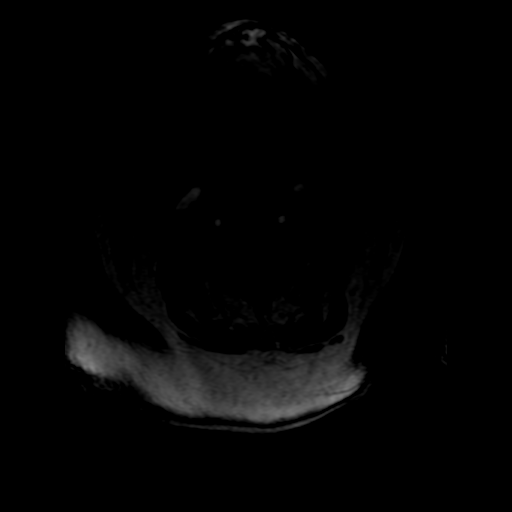
[im 108/108]
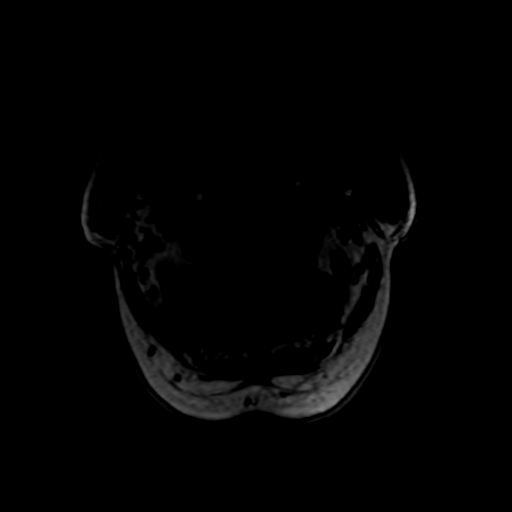

[Series 400: DWI · axial · 3.0mm · 1.09mm/px · z∈[-69,+67]mm · 2 of 47 slices shown (3 of 4)]
[im 1/47]
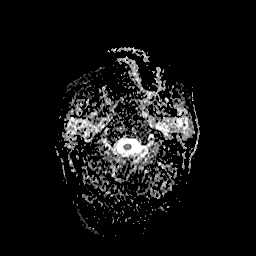
[im 47/47]
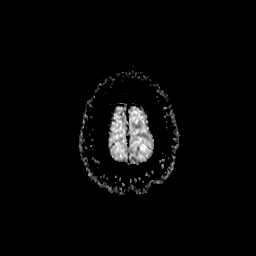

[Series 600: DWI · coronal · 5.0mm · 1.09mm/px · 1 of 33 slices shown (4 of 4)]
[im 1/33]
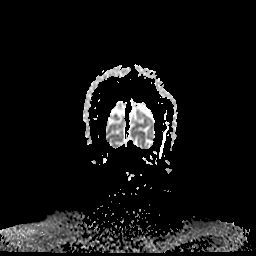

[24 of 48 positions shown; findings below may reference images not displayed]

FINDINGS: MRI HEAD FINDINGS

Acute nonhemorrhagic infarct is present in the medial right
occipital lobe measured 2.1 x 2.2 x 2.5 cm. No other focal areas of
restricted diffusion are evident. T2 changes are associated with the
area of acute infarction. Periventricular and subcortical T2 changes
are otherwise stable. Remote lacunar infarcts are evident in the
cerebellum bilaterally. There are remote lacunar infarcts in the
basal ganglia bilaterally as well.

MRA HEAD FINDINGS

The internal carotid arteries are within normal limits from the high
cervical segments through the ICA termini bilaterally. The A1 and M1
segments are normal. The anterior communicating artery is patent.
There is moderate attenuation of MCA and ACA branch vessels
bilaterally.

The left vertebral artery is slightly dominant to the right. The
PICA origins are visualized and normal. The vertebrobasilar junction
is normal. The basilar artery is within normal limits. Both
posterior cerebral arteries originate from the basilar tip. There is
some attenuation of distal PCA branch vessels.

MRA NECK FINDINGS

Time-of-flight images demonstrate no significant flow disturbance at
either carotid bifurcation. Flow is antegrade in the vertebral
arteries bilaterally.
IMPRESSION: 1. Acute nonhemorrhagic infarct within the medial right occipital
lobe measures 2.5 cm maximally.
2. Remote lacunar infarcts of the basal ganglia and cerebellum
bilaterally.
3. Mild age advanced atrophy and white matter disease.
4. Intracranial atherosclerotic changes with mild distal branch
vessel irregularity. There is no significant proximal stenosis,
aneurysm, or branch vessel occlusion.
5. Mild atherosclerotic changes at the carotid bifurcations
bilaterally without a significant stenosis.
6. Flow is antegrade in the vertebral arteries.

## 2017-03-12 LAB — CUP PACEART REMOTE DEVICE CHECK
Implantable Pulse Generator Implant Date: 20170412
MDC IDC SESS DTM: 20190103031404

## 2017-03-19 ENCOUNTER — Ambulatory Visit (INDEPENDENT_AMBULATORY_CARE_PROVIDER_SITE_OTHER): Payer: Medicare HMO | Admitting: Orthopaedic Surgery

## 2017-03-26 ENCOUNTER — Encounter (INDEPENDENT_AMBULATORY_CARE_PROVIDER_SITE_OTHER): Payer: Self-pay | Admitting: Orthopaedic Surgery

## 2017-03-26 ENCOUNTER — Ambulatory Visit (INDEPENDENT_AMBULATORY_CARE_PROVIDER_SITE_OTHER): Payer: Medicare HMO

## 2017-03-26 ENCOUNTER — Ambulatory Visit (INDEPENDENT_AMBULATORY_CARE_PROVIDER_SITE_OTHER): Payer: Medicare HMO | Admitting: Orthopaedic Surgery

## 2017-03-26 ENCOUNTER — Other Ambulatory Visit (INDEPENDENT_AMBULATORY_CARE_PROVIDER_SITE_OTHER): Payer: Self-pay | Admitting: Orthopaedic Surgery

## 2017-03-26 VITALS — BP 122/66 | HR 74 | Resp 12 | Ht 64.0 in | Wt 200.0 lb

## 2017-03-26 DIAGNOSIS — G8929 Other chronic pain: Secondary | ICD-10-CM

## 2017-03-26 DIAGNOSIS — M545 Low back pain: Secondary | ICD-10-CM | POA: Diagnosis not present

## 2017-03-26 NOTE — Progress Notes (Signed)
Office Visit Note   Patient: ARLYNE BRANDES           Date of Birth: [birth year]           MRN: [id number] Visit Date: 03/26/2017              Requested by: Asencion Noble, MD [address] [place], [place] [address] PCP: Asencion Noble, MD   Assessment & Plan: Visit Diagnoses:  1. Chronic left-sided low back pain, with sciatica presence unspecified     Plan: chronic left buttock pain most likely related tolumbar spine. X-rays revealed significant degenerative changes. I would be surprised if she didn't have spinal stenosis. We'll order a CT scan of her lumbar spine as she does have a defibrillator. Long discussion with Mrs. Maradiaga and her sister regarding all of the above with office visit approximately 45 minutes  Follow-Up Instructions: Return will schedule CT L-S spine.   Orders:  Orders Placed This Encounter  Procedures  . XR Lumbar Spine 2-3 Views   No orders of the defined types were placed in this encounter.     Procedures: No procedures performed   Clinical Data: No additional findings.   Subjective: Chief Complaint  Patient presents with  . Left Hip - Pain    Ms. Seabolt is n 56 y o here today for Left hip pain when she fell 3 years ago leaving house. Over the yrs pain has become increasingly worse then went to [name] on 03/08/2016. No groin, no back pain. HX of stroke in 01/2016.  [name] is accompanied by her sister and here for evaluation of chronic left buttock pain. She's had some trouble with that area for approximately 3 years. She feels like she's probably had a recent exacerbation as a result of several falls. She had films of her pelvis through the emergency room at [facility] on 02/10/2017. These demonstrated no acute or significant chronic bony abnormalities in the area of her left hip. She did have a stroke in December 2017 withresidual balance issues and may be some short-term memory loss according to her sister.  The pain in her "hip" is  actually in the area of the left parasacral region or buttock. She has trouble sometimes at night waking her up sometimes she has difficulty when she stands for a length of time or walks any distance. She is not having ann the right nor is she having any pain distally in the left lower extremity. She feels that the pain is compromising and thus referred for further evaluation. She does live by her self. She does have a cardiac defibrillator implanted  HPI  Review of Systems  Constitutional: Negative for chills, fatigue and fever.  Eyes: Negative for itching.  Respiratory: Negative for chest tightness and shortness of breath.   Cardiovascular: Positive for chest pain. Negative for palpitations and leg swelling.  Gastrointestinal: Negative for blood in stool, constipation and diarrhea.  Endocrine: Negative for polyuria.  Genitourinary: Negative for dysuria.  Musculoskeletal: Negative for back pain, joint swelling, neck pain and neck stiffness.  Allergic/Immunologic: Negative for immunocompromised state.  Neurological: Positive for speech difficulty. Negative for dizziness and numbness.  Hematological: Does not bruise/bleed easily.  Psychiatric/Behavioral: The patient is not nervous/anxious.      Objective: Vital Signs: BP 122/66   Pulse 74   Resp 12   Ht 5\' 4"  (1.626 m)   Wt 200 lb (90.7 kg)   BMI 34.33 kg/m   Physical Exam  Ortho  Examawake alert and oriented 3. Little bit slow in her response to questions. Straight leg raise is negative bilaterally. No painful range of motion of either hip or either knee. Does have some percussible test the lower lumbar spine in the area of her left parasacral region but no ecchymosis. No mass formation. Does not appear to have any weakness of her left lower extremity. Good capillary refill to her foot  Specialty Comments:  No specialty comments available.  Imaging: Xr Lumbar Spine 2-3 Views  Result Date: 03/26/2017 Films of lumbar spine were  obtained in several projections. There is near complete collapse of the L5-S1 disc space without listhesis. There are diffuse degenerative changes throughout the lumbar spine particularly at the disc spaces at L1-2 and L2-3 there is also calcification of the anterior longitudinal ligament. Diffuse calcification of the abdominal aorta without obvious aneurysmal dilatation. Facet joint sclerosis identified at L4-5 and L5-S1    PMFS History: Patient Active Problem List   Diagnosis Date Noted  . Cerebellar infarct ([place])   . Stroke ([place]) 02/22/2016  . Ischemic stroke ([place]) 02/22/2016  . Chronic diastolic congestive heart failure ([place]) 02/22/2016  . AKI (acute kidney injury) ([place]) 02/22/2016  . Diabetes mellitus with complication ([place])   . Shortness of breath 01/14/2016  . Wheezing 01/14/2016  . Paroxysmal atrial fibrillation ([place]) 11/14/2015  . Chronic anticoagulation 11/14/2015  . Cerebrovascular accident (CVA) due to embolism of right posterior cerebral artery ([place]) 08/08/2015  . Essential hypertension 08/08/2015  . HLD (hyperlipidemia) 08/08/2015  . Type 2 diabetes mellitus with circulatory disorder ([place]) 08/08/2015  . PFO (patent foramen ovale)   . ARF (acute renal failure) ([place]) 06/04/2015  . Cerebral infarction due to unspecified mechanism   . Acute CVA (cerebrovascular accident) ([place]) 06/02/2015  . Hypertension 06/02/2015  . Hyperlipidemia 06/02/2015  . CKD (chronic kidney disease) stage 4, GFR 15-29 ml/min (HCC) 06/02/2015  . DM type 2 (diabetes mellitus, type 2) ([place]) 06/02/2015  . Leukocytosis 06/02/2015  . Muscle weakness (generalized) 09/16/2012  . Pain in joint, shoulder region 09/16/2012  . Closed displaced fracture of lateral end of clavicle 08/18/2012   Past Medical History:  Diagnosis Date  . Depression   . Diabetes mellitus   . Hypertension   . PAF (paroxysmal atrial fibrillation) ([place])   . Stroke ([place])    TIA's x 2    Family History  Problem Relation Age of  Onset  . Stroke Father   . Dementia Sister     Past Surgical History:  Procedure Laterality Date  . COLONOSCOPY    . COLONOSCOPY N/A 09/09/2014   Procedure: COLONOSCOPY;  Surgeon: Rogene Houston, MD;  [facility];  Service: Endoscopy;  Laterality: N/A;  1010 - moved to 7:30 - Ann to notify  . EP IMPLANTABLE DEVICE N/A 06/07/2015   Procedure: Loop Recorder Insertion;  Surgeon: Deboraha Sprang, MD;  Location: [place] CV LAB;  Service: Cardiovascular;  Laterality: N/A;  . TEE WITHOUT CARDIOVERSION N/A 06/07/2015   Procedure: TRANSESOPHAGEAL ECHOCARDIOGRAM (TEE);  Surgeon: Thayer Headings, MD;  Location: [facility];  Service: Cardiovascular;  Laterality: N/A;   Social History   Occupational History  . Not on file  Tobacco Use  . Smoking status: Never Smoker  . Smokeless tobacco: Never Used  Substance and Sexual Activity  . Alcohol use: No  . Drug use: No  . Sexual activity: No

## 2017-03-28 ENCOUNTER — Ambulatory Visit (INDEPENDENT_AMBULATORY_CARE_PROVIDER_SITE_OTHER): Payer: Medicare HMO | Admitting: *Deleted

## 2017-03-28 DIAGNOSIS — I639 Cerebral infarction, unspecified: Secondary | ICD-10-CM | POA: Diagnosis not present

## 2017-03-31 ENCOUNTER — Other Ambulatory Visit (INDEPENDENT_AMBULATORY_CARE_PROVIDER_SITE_OTHER): Payer: Self-pay

## 2017-03-31 ENCOUNTER — Telehealth (INDEPENDENT_AMBULATORY_CARE_PROVIDER_SITE_OTHER): Payer: Self-pay | Admitting: Orthopaedic Surgery

## 2017-03-31 NOTE — Telephone Encounter (Signed)
Forward as requested

## 2017-03-31 NOTE — Telephone Encounter (Signed)
Amy Wiggins from [facility] called requesting orders for CT scan of lumbar spine be faxed to [phone].  If you have any questions please call Amy Wiggins at [phone]

## 2017-03-31 NOTE — Progress Notes (Signed)
Carelink Summary Report / Loop Recorder 

## 2017-03-31 NOTE — Telephone Encounter (Signed)
Pt scheduled for 04/03/17.

## 2017-03-31 NOTE — Telephone Encounter (Signed)
Please advise 

## 2017-04-01 LAB — CUP PACEART REMOTE DEVICE CHECK
Date Time Interrogation Session: 20190202034430
Implantable Pulse Generator Implant Date: 20170412

## 2017-04-03 ENCOUNTER — Ambulatory Visit (HOSPITAL_COMMUNITY)
Admission: RE | Admit: 2017-04-03 | Discharge: 2017-04-03 | Disposition: A | Discharge status: Home / Self Care | Payer: Medicare HMO | Source: Ambulatory Visit | Attending: Orthopaedic Surgery | Admitting: Orthopaedic Surgery

## 2017-04-03 DIAGNOSIS — M48061 Spinal stenosis, lumbar region without neurogenic claudication: Secondary | ICD-10-CM | POA: Diagnosis not present

## 2017-04-03 DIAGNOSIS — M5137 Other intervertebral disc degeneration, lumbosacral region: Secondary | ICD-10-CM | POA: Diagnosis not present

## 2017-04-03 DIAGNOSIS — M5136 Other intervertebral disc degeneration, lumbar region: Secondary | ICD-10-CM | POA: Diagnosis not present

## 2017-04-03 DIAGNOSIS — I700 Atherosclerosis of aorta: Secondary | ICD-10-CM | POA: Diagnosis not present

## 2017-04-03 DIAGNOSIS — M545 Low back pain: Secondary | ICD-10-CM | POA: Diagnosis not present

## 2017-04-07 ENCOUNTER — Ambulatory Visit (INDEPENDENT_AMBULATORY_CARE_PROVIDER_SITE_OTHER): Payer: Medicare HMO | Admitting: Orthopaedic Surgery

## 2017-04-09 ENCOUNTER — Ambulatory Visit (INDEPENDENT_AMBULATORY_CARE_PROVIDER_SITE_OTHER): Payer: Medicare HMO | Admitting: Orthopaedic Surgery

## 2017-04-09 ENCOUNTER — Encounter (INDEPENDENT_AMBULATORY_CARE_PROVIDER_SITE_OTHER): Payer: Self-pay | Admitting: Orthopaedic Surgery

## 2017-04-09 VITALS — Ht 64.0 in | Wt 200.0 lb

## 2017-04-09 DIAGNOSIS — G8929 Other chronic pain: Secondary | ICD-10-CM | POA: Diagnosis not present

## 2017-04-09 DIAGNOSIS — M545 Low back pain: Secondary | ICD-10-CM | POA: Diagnosis not present

## 2017-04-09 NOTE — Progress Notes (Signed)
Office Visit Note   Patient: Amy Wiggins           Date of Birth: 07-16-1931           MRN: [id number] Visit Date: 04/09/2017              Requested by: Asencion Noble, MD [address] [place], [place] [address] PCP: Asencion Noble, MD   Assessment & Plan: Visit Diagnoses:  1. Chronic bilateral low back pain, with sciatica presence unspecified     Plan: MRI scan demonstrates advanced L5-S1 disc degeneration was suspected left paracentral disc extrusion potentially resulting in left S1 nerve root impingement. Was mild multifactorial spinal stenosis at L4-5 and advanced L2-3 disc degeneration without stenosis. Also has aortic atherosclerosis without aneurysmal dilatation. Long discussion with Amy Wiggins and her family member. I think she has several choices at this point including physical therapy through [facility] or consideration of an epidural steroid injection. We will set up physical therapy as a first choice.  Follow-Up Instructions: Return if symptoms worsen or fail to improve.   Orders:  Orders Placed This Encounter  Procedures  . Ambulatory referral to Physical Therapy   No orders of the defined types were placed in this encounter.     Procedures: No procedures performed   Clinical Data: No additional findings.   Subjective: No chief complaint on file. No change in symptoms since since Amy Wiggins was here 2 weeks ago. He does have a cardiac defibrillator. Had a prior stroke in 2017 does have some weakness. Also has some issues with shortness of breath. Much of her pain is related to her back and left buttock. Not having any pain referred to her left lower extremity. She does have more trouble longer she stands of the further she walks.  HPI  Review of Systems  Constitutional: Negative for chills, fatigue and fever.  HENT: Negative for hearing loss and tinnitus.   Eyes: Negative for itching.  Respiratory: Positive for shortness of breath. Negative  for chest tightness.   Cardiovascular: Negative for chest pain, palpitations and leg swelling.  Gastrointestinal: Negative for blood in stool, constipation and diarrhea.  Endocrine: Negative for polyuria.  Genitourinary: Negative for dysuria.  Musculoskeletal: Positive for back pain. Negative for joint swelling, neck pain and neck stiffness.  Allergic/Immunologic: Negative for immunocompromised state.  Neurological: Negative for dizziness, numbness and headaches.  Hematological: Does not bruise/bleed easily.  Psychiatric/Behavioral: Negative for sleep disturbance. The patient is not nervous/anxious.      Objective: Vital Signs: Ht 5\' 4"  (1.626 m)   Wt 200 lb (90.7 kg)   BMI 34.33 kg/m   Physical Exam  Ortho Exam awake alert and oriented 3 comfortable sitting she is accompanied by her sister. Large abdominal panniculus. Straight leg raise negative painless range of motion both hips. Good capillary refill to toes. No distal edema.  Specialty Comments:  No specialty comments available.  Imaging: No results found.   PMFS History: Patient Active Problem List   Diagnosis Date Noted  . Cerebellar infarct ([place])   . Stroke ([place]) 02/22/2016  . Ischemic stroke ([place]) 02/22/2016  . Chronic diastolic congestive heart failure ([place]) 02/22/2016  . AKI (acute kidney injury) ([place]) 02/22/2016  . Diabetes mellitus with complication ([place])   . Shortness of breath 01/14/2016  . Wheezing 01/14/2016  . Paroxysmal atrial fibrillation ([place]) 11/14/2015  . Chronic anticoagulation 11/14/2015  . Cerebrovascular accident (CVA) due to embolism of right posterior cerebral artery ([place]) 08/08/2015  .  Essential hypertension 08/08/2015  . HLD (hyperlipidemia) 08/08/2015  . Type 2 diabetes mellitus with circulatory disorder ([place]) 08/08/2015  . PFO (patent foramen ovale)   . ARF (acute renal failure) ([place]) 06/04/2015  . Cerebral infarction due to unspecified mechanism   . Acute CVA (cerebrovascular  accident) ([place]) 06/02/2015  . Hypertension 06/02/2015  . Hyperlipidemia 06/02/2015  . CKD (chronic kidney disease) stage 4, GFR 15-29 ml/min (HCC) 06/02/2015  . DM type 2 (diabetes mellitus, type 2) ([place]) 06/02/2015  . Leukocytosis 06/02/2015  . Muscle weakness (generalized) 09/16/2012  . Pain in joint, shoulder region 09/16/2012  . Closed displaced fracture of lateral end of clavicle 08/18/2012   Past Medical History:  Diagnosis Date  . Depression   . Diabetes mellitus   . Hypertension   . PAF (paroxysmal atrial fibrillation) ([place])   . Stroke ([place])    TIA's x 2    Family History  Problem Relation Age of Onset  . Stroke Father   . Dementia Sister     Past Surgical History:  Procedure Laterality Date  . COLONOSCOPY    . COLONOSCOPY N/A 09/09/2014   Procedure: COLONOSCOPY;  Surgeon: Rogene Houston, MD;  [facility];  Service: Endoscopy;  Laterality: N/A;  1010 - moved to 7:30 - Ann to notify  . EP IMPLANTABLE DEVICE N/A 06/07/2015   Procedure: Loop Recorder Insertion;  Surgeon: Deboraha Sprang, MD;  Location: [place] CV LAB;  Service: Cardiovascular;  Laterality: N/A;  . TEE WITHOUT CARDIOVERSION N/A 06/07/2015   Procedure: TRANSESOPHAGEAL ECHOCARDIOGRAM (TEE);  Surgeon: Thayer Headings, MD;  Location: [facility];  Service: Cardiovascular;  Laterality: N/A;   Social History   Occupational History  . Not on file  Tobacco Use  . Smoking status: Never Smoker  . Smokeless tobacco: Never Used  Substance and Sexual Activity  . Alcohol use: No  . Drug use: No  . Sexual activity: No

## 2017-04-21 DIAGNOSIS — H353221 Exudative age-related macular degeneration, left eye, with active choroidal neovascularization: Secondary | ICD-10-CM | POA: Diagnosis not present

## 2017-04-30 ENCOUNTER — Ambulatory Visit (INDEPENDENT_AMBULATORY_CARE_PROVIDER_SITE_OTHER): Payer: Medicare HMO | Admitting: *Deleted

## 2017-04-30 DIAGNOSIS — I639 Cerebral infarction, unspecified: Secondary | ICD-10-CM

## 2017-05-01 NOTE — Progress Notes (Signed)
Carelink Summary Report / Loop Recorder 

## 2017-05-21 DIAGNOSIS — E6609 Other obesity due to excess calories: Secondary | ICD-10-CM | POA: Diagnosis not present

## 2017-05-21 DIAGNOSIS — F329 Major depressive disorder, single episode, unspecified: Secondary | ICD-10-CM | POA: Diagnosis not present

## 2017-05-21 DIAGNOSIS — N184 Chronic kidney disease, stage 4 (severe): Secondary | ICD-10-CM | POA: Diagnosis not present

## 2017-05-21 DIAGNOSIS — E1129 Type 2 diabetes mellitus with other diabetic kidney complication: Secondary | ICD-10-CM | POA: Diagnosis not present

## 2017-05-21 DIAGNOSIS — E785 Hyperlipidemia, unspecified: Secondary | ICD-10-CM | POA: Diagnosis not present

## 2017-05-21 DIAGNOSIS — M109 Gout, unspecified: Secondary | ICD-10-CM | POA: Diagnosis not present

## 2017-05-21 DIAGNOSIS — I480 Paroxysmal atrial fibrillation: Secondary | ICD-10-CM | POA: Diagnosis not present

## 2017-05-21 DIAGNOSIS — I10 Essential (primary) hypertension: Secondary | ICD-10-CM | POA: Diagnosis not present

## 2017-05-21 DIAGNOSIS — Z79899 Other long term (current) drug therapy: Secondary | ICD-10-CM | POA: Diagnosis not present

## 2017-05-27 DIAGNOSIS — N184 Chronic kidney disease, stage 4 (severe): Secondary | ICD-10-CM | POA: Diagnosis not present

## 2017-05-27 DIAGNOSIS — I4891 Unspecified atrial fibrillation: Secondary | ICD-10-CM | POA: Diagnosis not present

## 2017-05-27 DIAGNOSIS — F329 Major depressive disorder, single episode, unspecified: Secondary | ICD-10-CM | POA: Diagnosis not present

## 2017-05-28 ENCOUNTER — Other Ambulatory Visit (HOSPITAL_COMMUNITY): Payer: Self-pay | Admitting: Internal Medicine

## 2017-05-28 DIAGNOSIS — Z780 Asymptomatic menopausal state: Secondary | ICD-10-CM

## 2017-06-02 ENCOUNTER — Ambulatory Visit (INDEPENDENT_AMBULATORY_CARE_PROVIDER_SITE_OTHER): Payer: Medicare HMO | Admitting: *Deleted

## 2017-06-02 DIAGNOSIS — I639 Cerebral infarction, unspecified: Secondary | ICD-10-CM | POA: Diagnosis not present

## 2017-06-03 DIAGNOSIS — H353221 Exudative age-related macular degeneration, left eye, with active choroidal neovascularization: Secondary | ICD-10-CM | POA: Diagnosis not present

## 2017-06-03 NOTE — Progress Notes (Signed)
Carelink Summary Report / Loop Recorder 

## 2017-06-04 ENCOUNTER — Encounter: Payer: Self-pay | Admitting: Internal Medicine

## 2017-06-04 ENCOUNTER — Ambulatory Visit (HOSPITAL_COMMUNITY)
Admission: RE | Admit: 2017-06-04 | Discharge: 2017-06-04 | Disposition: A | Discharge status: Home / Self Care | Payer: Medicare HMO | Source: Ambulatory Visit | Attending: Internal Medicine | Admitting: Internal Medicine

## 2017-06-04 DIAGNOSIS — Z780 Asymptomatic menopausal state: Secondary | ICD-10-CM | POA: Diagnosis not present

## 2017-06-04 DIAGNOSIS — M8589 Other specified disorders of bone density and structure, multiple sites: Secondary | ICD-10-CM | POA: Insufficient documentation

## 2017-06-10 ENCOUNTER — Ambulatory Visit: Payer: Medicare HMO | Admitting: Internal Medicine

## 2017-06-10 ENCOUNTER — Encounter: Payer: Self-pay | Admitting: Internal Medicine

## 2017-06-10 VITALS — BP 114/60 | HR 65 | Ht 63.0 in | Wt 207.0 lb

## 2017-06-10 DIAGNOSIS — Z4509 Encounter for adjustment and management of other cardiac device: Secondary | ICD-10-CM | POA: Diagnosis not present

## 2017-06-10 DIAGNOSIS — I480 Paroxysmal atrial fibrillation: Secondary | ICD-10-CM | POA: Diagnosis not present

## 2017-06-10 DIAGNOSIS — I639 Cerebral infarction, unspecified: Secondary | ICD-10-CM

## 2017-06-10 NOTE — Patient Instructions (Addendum)
Medication Instructions:  Your physician has recommended you make the following change in your medication:   Decrease your Losartan to 50mg , half tablet every evening  Labwork: You will have a CBC and BMP drawn today   Testing/Procedures: None ordered.  Follow-Up: Your physician recommends that you schedule a follow-up appointment in:  One Year With Dr Caryl Comes   Any Other Special Instructions Will Be Listed Below (If Applicable).     If you need a refill on your cardiac medications before your next appointment, please call your pharmacy.

## 2017-06-10 NOTE — Progress Notes (Signed)
Patient Care Team: Asencion Noble, MD as PCP - General (Internal Medicine)   HPI  Amy Wiggins is a 84 y.o. female Seen in follow-up for recorder placement 4/17 for cryptogenic stroke  She has been diagnosed with atrial fibrillation. She is on ELIQUIS.  Creatinine has ranged from 1.48-2.3  She has exercise intolerance.  She has some unsteadiness of gait and is dizzy upon standing  " looks like she is staggering."   BP at home 110-120   No bleeding  No interval palpitations  Thromboembolic risk profile is notable for age-28 stroke-2 hypertension-1 diabetes-1 gender-1 for a CHADS-VASc score of greater than or equal to 7 Past Medical History:  Diagnosis Date  . Depression   . Diabetes mellitus   . Hypertension   . PAF (paroxysmal atrial fibrillation) ([place])   . Stroke ([place])    TIA's x 2    Past Surgical History:  Procedure Laterality Date  . COLONOSCOPY    . COLONOSCOPY N/A 09/09/2014   Procedure: COLONOSCOPY;  Surgeon: Rogene Houston, MD;  [facility];  Service: Endoscopy;  Laterality: N/A;  1010 - moved to 7:30 - Ann to notify  . EP IMPLANTABLE DEVICE N/A 06/07/2015   Procedure: Loop Recorder Insertion;  Surgeon: Deboraha Sprang, MD;  Location: [place] CV LAB;  Service: Cardiovascular;  Laterality: N/A;  . TEE WITHOUT CARDIOVERSION N/A 06/07/2015   Procedure: TRANSESOPHAGEAL ECHOCARDIOGRAM (TEE);  Surgeon: Thayer Headings, MD;  Location: [facility] ENDOSCOPY;  Service: Cardiovascular;  Laterality: N/A;    Current Outpatient Medications  Medication Sig Dispense Refill  . acetaminophen (TYLENOL) 500 MG tablet Take 1,000 mg by mouth See admin instructions. Take 2 tablets (1000 mg) by mouth daily at bedtime, may also take 2 tablets during the day as needed for pain    . atorvastatin (LIPITOR) 20 MG tablet Take 2 tablets (40 mg total) by mouth every evening. 4pm 60 tablet 0  . chlorthalidone (HYGROTON) 25 MG tablet Take 25 mg by mouth daily. For high blood  pressure/fluid    . citalopram (CELEXA) 20 MG tablet     . ELIQUIS 2.5 MG TABS tablet Take 2.5 mg by mouth 2 (two) times daily.     Marland Kitchen LANTUS SOLOSTAR 100 UNIT/ML Solostar Pen     . losartan (COZAAR) 100 MG tablet Take 100 mg by mouth every evening. For high blood pressure - 4pm    . magnesium oxide (MAG-OX) 400 MG tablet Take 400 mg by mouth every evening. 4pm    . metoprolol tartrate (LOPRESSOR) 25 MG tablet Take 1 tablet (25 mg total) by mouth 2 (two) times daily. 30 tablet 6  . Multiple Vitamin (MULTIVITAMIN WITH MINERALS) TABS tablet Take 1 tablet by mouth at bedtime. For supplement    . Multiple Vitamins-Minerals (PRESERVISION AREDS 2) CAPS Take 1 capsule by mouth 2 (two) times daily.    Marland Kitchen NOVOFINE 32G X 6 MM MISC     . pantoprazole (PROTONIX) 40 MG tablet Take 40 mg by mouth daily. For acid reflux/gerd    . pioglitazone (ACTOS) 15 MG tablet Take 15 mg by mouth every evening. 4pm    . traZODone (DESYREL) 100 MG tablet Take 100 mg by mouth at bedtime.    Marland Kitchen venlafaxine XR (EFFEXOR-XR) 75 MG 24 hr capsule Take 75 mg by mouth daily with breakfast.     No current facility-administered medications for this visit.     No Known Allergies    Review  of Systems negative except from HPI and PMH  Physical Exam BP 114/60   Pulse 65   Ht 5\' 3"  (1.6 m)   Wt 207 lb (93.9 kg)   SpO2 96%   BMI 36.67 kg/m  Well developed and well nourished in no acute distress HENT normal E scleral and icterus clear Neck Supple JVP flat; carotids brisk and full Clear to ausculation Regular rate and rhythm, no murmurs gallops or rub Soft with active bowel sounds No clubbing cyanosis Edema Alert and oriented, grossly normal motor and sensory function Skin Warm and Dry  ECG  Sinus @ 66 22/10/43 Axis -38 rsR'  Assessment and  Plan  Atrial fibrillation paroxysmal   HTN  Loop recorder  Chronic renal insufficiency  Orthostatic Hypotension   The pt has had infreq afib  She is on  anticoagulation without bleeding  Will check surveillance labs today.  Blood pressure is well controlled and with moderate orthostatic hypotension--will decrease losartan 100>>50   Have reviewed maneuvers to mitigate orthostatic intolerance--slowly up and considearation of compressive clothing   We spent more than 50% of our >25 min visit in face to face counseling regarding the above     Current medicines are reviewed at length with the patient today .  The patient does not have concerns regarding medicines.

## 2017-06-11 LAB — CBC WITH DIFFERENTIAL/PLATELET
BASOS: 1 %
Basophils Absolute: 0.1 10*3/uL (ref 0.0–0.2)
EOS (ABSOLUTE): 0.6 10*3/uL — ABNORMAL HIGH (ref 0.0–0.4)
Eos: 6 %
HEMOGLOBIN: 13.5 g/dL (ref 11.1–15.9)
Hematocrit: 41.6 % (ref 34.0–46.6)
Immature Grans (Abs): 0 10*3/uL (ref 0.0–0.1)
Immature Granulocytes: 0 %
LYMPHS ABS: 2.3 10*3/uL (ref 0.7–3.1)
Lymphs: 21 %
MCH: 27.1 pg (ref 26.6–33.0)
MCHC: 32.5 g/dL (ref 31.5–35.7)
MCV: 84 fL (ref 79–97)
MONOCYTES: 8 %
MONOS ABS: 0.9 10*3/uL (ref 0.1–0.9)
NEUTROS ABS: 7.1 10*3/uL — AB (ref 1.4–7.0)
Neutrophils: 64 %
Platelets: 268 10*3/uL (ref 150–379)
RBC: 4.98 x10E6/uL (ref 3.77–5.28)
RDW: 15.8 % — AB (ref 12.3–15.4)
WBC: 11 10*3/uL — AB (ref 3.4–10.8)

## 2017-06-11 LAB — CUP PACEART INCLINIC DEVICE CHECK
Date Time Interrogation Session: 20190417071912
MDC IDC PG IMPLANT DT: 20170412

## 2017-06-11 LAB — BASIC METABOLIC PANEL
BUN / CREAT RATIO: 21 (ref 12–28)
BUN: 32 mg/dL — ABNORMAL HIGH (ref 8–27)
CO2: 31 mmol/L — ABNORMAL HIGH (ref 20–29)
CREATININE: 1.52 mg/dL — AB (ref 0.57–1.00)
Calcium: 10.7 mg/dL — ABNORMAL HIGH (ref 8.7–10.3)
Chloride: 95 mmol/L — ABNORMAL LOW (ref 96–106)
GFR, EST AFRICAN AMERICAN: 36 mL/min/{1.73_m2} — AB (ref >59)
GFR, EST NON AFRICAN AMERICAN: 31 mL/min/{1.73_m2} — AB (ref >59)
GLUCOSE: 124 mg/dL — AB (ref 65–99)
Potassium: 4.5 mmol/L (ref 3.5–5.2)
SODIUM: 139 mmol/L (ref 134–144)

## 2017-06-12 LAB — CUP PACEART REMOTE DEVICE CHECK
Date Time Interrogation Session: 20190307033525
MDC IDC PG IMPLANT DT: 20170412

## 2017-06-20 DIAGNOSIS — K1370 Unspecified lesions of oral mucosa: Secondary | ICD-10-CM | POA: Diagnosis not present

## 2017-06-23 ENCOUNTER — Ambulatory Visit (INDEPENDENT_AMBULATORY_CARE_PROVIDER_SITE_OTHER): Payer: Medicare HMO | Admitting: Otolaryngology

## 2017-06-23 DIAGNOSIS — K122 Cellulitis and abscess of mouth: Secondary | ICD-10-CM

## 2017-06-23 DIAGNOSIS — R070 Pain in throat: Secondary | ICD-10-CM | POA: Diagnosis not present

## 2017-07-04 LAB — CUP PACEART REMOTE DEVICE CHECK
Date Time Interrogation Session: 20190409074131
MDC IDC PG IMPLANT DT: 20170412

## 2017-07-07 ENCOUNTER — Ambulatory Visit (INDEPENDENT_AMBULATORY_CARE_PROVIDER_SITE_OTHER): Payer: Medicare HMO | Admitting: *Deleted

## 2017-07-07 DIAGNOSIS — I639 Cerebral infarction, unspecified: Secondary | ICD-10-CM | POA: Diagnosis not present

## 2017-07-08 DIAGNOSIS — H353221 Exudative age-related macular degeneration, left eye, with active choroidal neovascularization: Secondary | ICD-10-CM | POA: Diagnosis not present

## 2017-07-08 NOTE — Progress Notes (Signed)
Carelink Summary Report / Loop Recorder 

## 2017-07-14 ENCOUNTER — Ambulatory Visit (INDEPENDENT_AMBULATORY_CARE_PROVIDER_SITE_OTHER): Payer: Medicare HMO | Admitting: Otolaryngology

## 2017-07-14 DIAGNOSIS — D3709 Neoplasm of uncertain behavior of other specified sites of the oral cavity: Secondary | ICD-10-CM

## 2017-08-01 LAB — CUP PACEART REMOTE DEVICE CHECK
Date Time Interrogation Session: 20190512080513
MDC IDC PG IMPLANT DT: 20170412

## 2017-08-04 ENCOUNTER — Ambulatory Visit (INDEPENDENT_AMBULATORY_CARE_PROVIDER_SITE_OTHER): Payer: Medicare HMO | Admitting: Otolaryngology

## 2017-08-08 ENCOUNTER — Ambulatory Visit (INDEPENDENT_AMBULATORY_CARE_PROVIDER_SITE_OTHER): Payer: Medicare HMO | Admitting: *Deleted

## 2017-08-08 DIAGNOSIS — I639 Cerebral infarction, unspecified: Secondary | ICD-10-CM | POA: Diagnosis not present

## 2017-08-11 DIAGNOSIS — H353221 Exudative age-related macular degeneration, left eye, with active choroidal neovascularization: Secondary | ICD-10-CM | POA: Diagnosis not present

## 2017-08-11 NOTE — Progress Notes (Signed)
Carelink Summary Report / Loop Recorder 

## 2017-09-02 DIAGNOSIS — I10 Essential (primary) hypertension: Secondary | ICD-10-CM | POA: Diagnosis not present

## 2017-09-02 DIAGNOSIS — E1129 Type 2 diabetes mellitus with other diabetic kidney complication: Secondary | ICD-10-CM | POA: Diagnosis not present

## 2017-09-02 DIAGNOSIS — Z79899 Other long term (current) drug therapy: Secondary | ICD-10-CM | POA: Diagnosis not present

## 2017-09-02 DIAGNOSIS — M109 Gout, unspecified: Secondary | ICD-10-CM | POA: Diagnosis not present

## 2017-09-02 DIAGNOSIS — E785 Hyperlipidemia, unspecified: Secondary | ICD-10-CM | POA: Diagnosis not present

## 2017-09-02 DIAGNOSIS — I480 Paroxysmal atrial fibrillation: Secondary | ICD-10-CM | POA: Diagnosis not present

## 2017-09-02 DIAGNOSIS — N184 Chronic kidney disease, stage 4 (severe): Secondary | ICD-10-CM | POA: Diagnosis not present

## 2017-09-04 ENCOUNTER — Ambulatory Visit (INDEPENDENT_AMBULATORY_CARE_PROVIDER_SITE_OTHER): Payer: Medicare HMO | Admitting: Otolaryngology

## 2017-09-04 DIAGNOSIS — K1230 Oral mucositis (ulcerative), unspecified: Secondary | ICD-10-CM | POA: Diagnosis not present

## 2017-09-10 ENCOUNTER — Ambulatory Visit (INDEPENDENT_AMBULATORY_CARE_PROVIDER_SITE_OTHER): Payer: Medicare HMO | Admitting: *Deleted

## 2017-09-10 DIAGNOSIS — I639 Cerebral infarction, unspecified: Secondary | ICD-10-CM | POA: Diagnosis not present

## 2017-09-10 NOTE — Progress Notes (Signed)
Carelink Summary Report / Loop Recorder 

## 2017-09-12 DIAGNOSIS — E1129 Type 2 diabetes mellitus with other diabetic kidney complication: Secondary | ICD-10-CM | POA: Diagnosis not present

## 2017-09-12 DIAGNOSIS — I480 Paroxysmal atrial fibrillation: Secondary | ICD-10-CM | POA: Diagnosis not present

## 2017-09-12 DIAGNOSIS — N184 Chronic kidney disease, stage 4 (severe): Secondary | ICD-10-CM | POA: Diagnosis not present

## 2017-09-13 LAB — CUP PACEART REMOTE DEVICE CHECK
Date Time Interrogation Session: 20190614120946
Implantable Pulse Generator Implant Date: 20170412

## 2017-09-17 DIAGNOSIS — H353221 Exudative age-related macular degeneration, left eye, with active choroidal neovascularization: Secondary | ICD-10-CM | POA: Diagnosis not present

## 2017-10-09 ENCOUNTER — Telehealth: Payer: Self-pay | Admitting: Cardiology

## 2017-10-09 NOTE — Telephone Encounter (Signed)
LMOVM requesting that pt send manual transmission b/c home monitor has not updated in at least 14 days.    

## 2017-10-13 ENCOUNTER — Ambulatory Visit (INDEPENDENT_AMBULATORY_CARE_PROVIDER_SITE_OTHER): Payer: Medicare HMO | Admitting: *Deleted

## 2017-10-13 DIAGNOSIS — I639 Cerebral infarction, unspecified: Secondary | ICD-10-CM | POA: Diagnosis not present

## 2017-10-14 NOTE — Progress Notes (Signed)
Carelink Summary Report / Loop Recorder 

## 2017-10-28 ENCOUNTER — Telehealth: Payer: Self-pay | Admitting: Cardiology

## 2017-10-28 LAB — CUP PACEART REMOTE DEVICE CHECK
Date Time Interrogation Session: 20190717120735
MDC IDC PG IMPLANT DT: 20170412

## 2017-10-28 NOTE — Telephone Encounter (Signed)
LMOVM requesting that pt send manual transmission b/c home monitor has not updated in at least 14 days.    

## 2017-11-17 ENCOUNTER — Ambulatory Visit (INDEPENDENT_AMBULATORY_CARE_PROVIDER_SITE_OTHER): Payer: Medicare HMO | Admitting: *Deleted

## 2017-11-17 DIAGNOSIS — I639 Cerebral infarction, unspecified: Secondary | ICD-10-CM

## 2017-11-17 LAB — CUP PACEART REMOTE DEVICE CHECK
Date Time Interrogation Session: 20190819120842
MDC IDC PG IMPLANT DT: 20170412

## 2017-11-17 NOTE — Progress Notes (Signed)
Carelink Summary Report / Loop Recorder 

## 2017-11-20 ENCOUNTER — Telehealth: Payer: Self-pay

## 2017-11-20 NOTE — Telephone Encounter (Signed)
LMOVM requesting that pt send manual transmission b/c home monitor has not updated in at least 14 days.    

## 2017-11-24 LAB — CUP PACEART REMOTE DEVICE CHECK
MDC IDC PG IMPLANT DT: 20170412
MDC IDC SESS DTM: 20190921123718

## 2017-11-27 DIAGNOSIS — N184 Chronic kidney disease, stage 4 (severe): Secondary | ICD-10-CM | POA: Diagnosis not present

## 2017-11-27 DIAGNOSIS — I480 Paroxysmal atrial fibrillation: Secondary | ICD-10-CM | POA: Diagnosis not present

## 2017-11-27 DIAGNOSIS — E1129 Type 2 diabetes mellitus with other diabetic kidney complication: Secondary | ICD-10-CM | POA: Diagnosis not present

## 2017-11-27 DIAGNOSIS — Z79899 Other long term (current) drug therapy: Secondary | ICD-10-CM | POA: Diagnosis not present

## 2017-11-28 ENCOUNTER — Encounter: Payer: Self-pay | Admitting: Cardiology

## 2017-12-05 DIAGNOSIS — E1122 Type 2 diabetes mellitus with diabetic chronic kidney disease: Secondary | ICD-10-CM | POA: Diagnosis not present

## 2017-12-05 DIAGNOSIS — Z23 Encounter for immunization: Secondary | ICD-10-CM | POA: Diagnosis not present

## 2017-12-05 DIAGNOSIS — N184 Chronic kidney disease, stage 4 (severe): Secondary | ICD-10-CM | POA: Diagnosis not present

## 2017-12-05 DIAGNOSIS — I480 Paroxysmal atrial fibrillation: Secondary | ICD-10-CM | POA: Diagnosis not present

## 2017-12-18 ENCOUNTER — Encounter: Payer: Medicare HMO | Admitting: *Deleted

## 2018-01-05 ENCOUNTER — Telehealth: Payer: Self-pay | Admitting: Cardiology

## 2018-01-05 NOTE — Telephone Encounter (Signed)
LMOVM requesting that pt send manual transmission b/c home monitor has not updated in at least 14 days.    

## 2018-01-20 ENCOUNTER — Ambulatory Visit (INDEPENDENT_AMBULATORY_CARE_PROVIDER_SITE_OTHER): Payer: Medicare HMO

## 2018-01-20 DIAGNOSIS — I639 Cerebral infarction, unspecified: Secondary | ICD-10-CM | POA: Diagnosis not present

## 2018-01-20 NOTE — Progress Notes (Signed)
Carelink Summary Report / Loop Recorder 

## 2018-02-23 ENCOUNTER — Ambulatory Visit (INDEPENDENT_AMBULATORY_CARE_PROVIDER_SITE_OTHER): Payer: Medicare HMO

## 2018-02-23 DIAGNOSIS — I639 Cerebral infarction, unspecified: Secondary | ICD-10-CM | POA: Diagnosis not present

## 2018-02-23 NOTE — Progress Notes (Signed)
Carelink Summary Report / Loop Recorder 

## 2018-02-24 LAB — CUP PACEART REMOTE DEVICE CHECK
Date Time Interrogation Session: 20191229144036
Implantable Pulse Generator Implant Date: 20170412

## 2018-03-08 LAB — CUP PACEART REMOTE DEVICE CHECK
Date Time Interrogation Session: 20191126134024
Implantable Pulse Generator Implant Date: 20170412

## 2018-03-09 DIAGNOSIS — N184 Chronic kidney disease, stage 4 (severe): Secondary | ICD-10-CM | POA: Diagnosis not present

## 2018-03-09 DIAGNOSIS — E1129 Type 2 diabetes mellitus with other diabetic kidney complication: Secondary | ICD-10-CM | POA: Diagnosis not present

## 2018-03-09 DIAGNOSIS — Z79899 Other long term (current) drug therapy: Secondary | ICD-10-CM | POA: Diagnosis not present

## 2018-03-13 DIAGNOSIS — N184 Chronic kidney disease, stage 4 (severe): Secondary | ICD-10-CM | POA: Diagnosis not present

## 2018-03-13 DIAGNOSIS — I10 Essential (primary) hypertension: Secondary | ICD-10-CM | POA: Diagnosis not present

## 2018-03-13 DIAGNOSIS — E1122 Type 2 diabetes mellitus with diabetic chronic kidney disease: Secondary | ICD-10-CM | POA: Diagnosis not present

## 2018-03-27 ENCOUNTER — Ambulatory Visit (INDEPENDENT_AMBULATORY_CARE_PROVIDER_SITE_OTHER): Payer: Medicare PPO

## 2018-03-27 DIAGNOSIS — I639 Cerebral infarction, unspecified: Secondary | ICD-10-CM

## 2018-03-27 LAB — CUP PACEART REMOTE DEVICE CHECK
Implantable Pulse Generator Implant Date: 20170412
MDC IDC SESS DTM: 20200131151014

## 2018-04-06 NOTE — Progress Notes (Signed)
Carelink Summary Report / Loop Recorder 

## 2018-04-29 ENCOUNTER — Ambulatory Visit (INDEPENDENT_AMBULATORY_CARE_PROVIDER_SITE_OTHER): Payer: Medicare HMO | Admitting: *Deleted

## 2018-04-29 DIAGNOSIS — I639 Cerebral infarction, unspecified: Secondary | ICD-10-CM | POA: Diagnosis not present

## 2018-04-29 LAB — CUP PACEART REMOTE DEVICE CHECK
Date Time Interrogation Session: 20200304150034
MDC IDC PG IMPLANT DT: 20170412

## 2018-05-06 NOTE — Progress Notes (Signed)
Carelink Summary Report / Loop Recorder 

## 2018-06-01 ENCOUNTER — Ambulatory Visit (INDEPENDENT_AMBULATORY_CARE_PROVIDER_SITE_OTHER): Payer: Medicare HMO | Admitting: *Deleted

## 2018-06-01 ENCOUNTER — Other Ambulatory Visit: Payer: Self-pay

## 2018-06-01 DIAGNOSIS — I639 Cerebral infarction, unspecified: Secondary | ICD-10-CM | POA: Diagnosis not present

## 2018-06-02 LAB — CUP PACEART REMOTE DEVICE CHECK
Date Time Interrogation Session: 20200406164108
Implantable Pulse Generator Implant Date: 20170412

## 2018-06-09 NOTE — Progress Notes (Signed)
Carelink Summary Report / Loop Recorder 

## 2018-06-12 ENCOUNTER — Telehealth: Payer: Self-pay

## 2018-06-12 NOTE — Telephone Encounter (Signed)
I tried to call patient to verify appointment type and get consent, but when I dialed her number the message said no channel available. I tried to call patients son, there was no answer and the voicemail was full.

## 2018-06-15 ENCOUNTER — Other Ambulatory Visit: Payer: Self-pay

## 2018-06-15 ENCOUNTER — Telehealth: Payer: Medicare HMO | Admitting: Internal Medicine

## 2018-07-07 ENCOUNTER — Ambulatory Visit (INDEPENDENT_AMBULATORY_CARE_PROVIDER_SITE_OTHER): Payer: Medicare HMO | Admitting: *Deleted

## 2018-07-07 ENCOUNTER — Other Ambulatory Visit: Payer: Self-pay

## 2018-07-07 DIAGNOSIS — I639 Cerebral infarction, unspecified: Secondary | ICD-10-CM

## 2018-07-08 LAB — CUP PACEART REMOTE DEVICE CHECK
Date Time Interrogation Session: 20200509174039
Implantable Pulse Generator Implant Date: 20170412

## 2018-07-24 NOTE — Progress Notes (Signed)
Carelink Summary Report / Loop Recorder 

## 2018-08-06 ENCOUNTER — Ambulatory Visit (INDEPENDENT_AMBULATORY_CARE_PROVIDER_SITE_OTHER): Payer: Medicare HMO | Admitting: *Deleted

## 2018-08-06 DIAGNOSIS — I639 Cerebral infarction, unspecified: Secondary | ICD-10-CM

## 2018-08-07 LAB — CUP PACEART REMOTE DEVICE CHECK
Date Time Interrogation Session: 20200611181058
Implantable Pulse Generator Implant Date: 20170412

## 2018-08-12 NOTE — Progress Notes (Signed)
Carelink Summary Report / Loop Recorder 

## 2018-09-08 ENCOUNTER — Ambulatory Visit (INDEPENDENT_AMBULATORY_CARE_PROVIDER_SITE_OTHER): Payer: Medicare HMO | Admitting: *Deleted

## 2018-09-08 DIAGNOSIS — I639 Cerebral infarction, unspecified: Secondary | ICD-10-CM | POA: Diagnosis not present

## 2018-09-08 LAB — CUP PACEART REMOTE DEVICE CHECK
Date Time Interrogation Session: 20200714180938
Implantable Pulse Generator Implant Date: 20170412

## 2018-09-23 NOTE — Progress Notes (Signed)
Carelink Summary Report / Loop Recorder 

## 2018-09-24 DIAGNOSIS — L820 Inflamed seborrheic keratosis: Secondary | ICD-10-CM | POA: Diagnosis not present

## 2018-09-28 ENCOUNTER — Other Ambulatory Visit: Payer: Self-pay

## 2018-09-28 ENCOUNTER — Inpatient Hospital Stay (HOSPITAL_COMMUNITY)
Admission: EM | Admit: 2018-09-28 | Discharge: 2018-10-06 | DRG: 291 | Disposition: A | Discharge status: Home Health | Payer: Medicare HMO | Attending: Family Medicine | Admitting: Family Medicine

## 2018-09-28 ENCOUNTER — Emergency Department (HOSPITAL_COMMUNITY): Payer: Medicare HMO

## 2018-09-28 ENCOUNTER — Encounter (HOSPITAL_COMMUNITY): Payer: Self-pay | Admitting: Emergency Medicine

## 2018-09-28 DIAGNOSIS — I959 Hypotension, unspecified: Secondary | ICD-10-CM | POA: Diagnosis present

## 2018-09-28 DIAGNOSIS — I509 Heart failure, unspecified: Secondary | ICD-10-CM | POA: Diagnosis not present

## 2018-09-28 DIAGNOSIS — F329 Major depressive disorder, single episode, unspecified: Secondary | ICD-10-CM | POA: Diagnosis not present

## 2018-09-28 DIAGNOSIS — I5031 Acute diastolic (congestive) heart failure: Secondary | ICD-10-CM | POA: Diagnosis not present

## 2018-09-28 DIAGNOSIS — I5033 Acute on chronic diastolic (congestive) heart failure: Secondary | ICD-10-CM | POA: Diagnosis not present

## 2018-09-28 DIAGNOSIS — R5381 Other malaise: Secondary | ICD-10-CM | POA: Diagnosis not present

## 2018-09-28 DIAGNOSIS — I63431 Cerebral infarction due to embolism of right posterior cerebral artery: Secondary | ICD-10-CM | POA: Diagnosis not present

## 2018-09-28 DIAGNOSIS — E876 Hypokalemia: Secondary | ICD-10-CM | POA: Diagnosis not present

## 2018-09-28 DIAGNOSIS — Z7901 Long term (current) use of anticoagulants: Secondary | ICD-10-CM

## 2018-09-28 DIAGNOSIS — I130 Hypertensive heart and chronic kidney disease with heart failure and stage 1 through stage 4 chronic kidney disease, or unspecified chronic kidney disease: Secondary | ICD-10-CM | POA: Diagnosis not present

## 2018-09-28 DIAGNOSIS — Z8673 Personal history of transient ischemic attack (TIA), and cerebral infarction without residual deficits: Secondary | ICD-10-CM

## 2018-09-28 DIAGNOSIS — I480 Paroxysmal atrial fibrillation: Secondary | ICD-10-CM | POA: Diagnosis present

## 2018-09-28 DIAGNOSIS — M25572 Pain in left ankle and joints of left foot: Secondary | ICD-10-CM | POA: Diagnosis present

## 2018-09-28 DIAGNOSIS — R000 Tachycardia, unspecified: Secondary | ICD-10-CM | POA: Diagnosis not present

## 2018-09-28 DIAGNOSIS — I482 Chronic atrial fibrillation, unspecified: Secondary | ICD-10-CM | POA: Diagnosis present

## 2018-09-28 DIAGNOSIS — E785 Hyperlipidemia, unspecified: Secondary | ICD-10-CM | POA: Diagnosis present

## 2018-09-28 DIAGNOSIS — Z794 Long term (current) use of insulin: Secondary | ICD-10-CM | POA: Diagnosis not present

## 2018-09-28 DIAGNOSIS — Z9114 Patient's other noncompliance with medication regimen: Secondary | ICD-10-CM

## 2018-09-28 DIAGNOSIS — J9601 Acute respiratory failure with hypoxia: Secondary | ICD-10-CM | POA: Diagnosis present

## 2018-09-28 DIAGNOSIS — R0989 Other specified symptoms and signs involving the circulatory and respiratory systems: Secondary | ICD-10-CM | POA: Diagnosis not present

## 2018-09-28 DIAGNOSIS — I4510 Unspecified right bundle-branch block: Secondary | ICD-10-CM | POA: Diagnosis present

## 2018-09-28 DIAGNOSIS — E119 Type 2 diabetes mellitus without complications: Secondary | ICD-10-CM

## 2018-09-28 DIAGNOSIS — J9621 Acute and chronic respiratory failure with hypoxia: Secondary | ICD-10-CM | POA: Diagnosis present

## 2018-09-28 DIAGNOSIS — F419 Anxiety disorder, unspecified: Secondary | ICD-10-CM | POA: Diagnosis present

## 2018-09-28 DIAGNOSIS — I050 Rheumatic mitral stenosis: Secondary | ICD-10-CM | POA: Diagnosis present

## 2018-09-28 DIAGNOSIS — I4891 Unspecified atrial fibrillation: Secondary | ICD-10-CM

## 2018-09-28 DIAGNOSIS — E1159 Type 2 diabetes mellitus with other circulatory complications: Secondary | ICD-10-CM | POA: Diagnosis not present

## 2018-09-28 DIAGNOSIS — Z20828 Contact with and (suspected) exposure to other viral communicable diseases: Secondary | ICD-10-CM | POA: Diagnosis not present

## 2018-09-28 DIAGNOSIS — R0602 Shortness of breath: Secondary | ICD-10-CM | POA: Diagnosis not present

## 2018-09-28 DIAGNOSIS — J90 Pleural effusion, not elsewhere classified: Secondary | ICD-10-CM | POA: Diagnosis not present

## 2018-09-28 DIAGNOSIS — G4700 Insomnia, unspecified: Secondary | ICD-10-CM | POA: Diagnosis present

## 2018-09-28 DIAGNOSIS — I10 Essential (primary) hypertension: Secondary | ICD-10-CM | POA: Diagnosis present

## 2018-09-28 DIAGNOSIS — N390 Urinary tract infection, site not specified: Secondary | ICD-10-CM | POA: Diagnosis not present

## 2018-09-28 DIAGNOSIS — R002 Palpitations: Secondary | ICD-10-CM | POA: Diagnosis not present

## 2018-09-28 DIAGNOSIS — I110 Hypertensive heart disease with heart failure: Secondary | ICD-10-CM | POA: Diagnosis not present

## 2018-09-28 DIAGNOSIS — Z79899 Other long term (current) drug therapy: Secondary | ICD-10-CM | POA: Diagnosis not present

## 2018-09-28 DIAGNOSIS — N183 Chronic kidney disease, stage 3 (moderate): Secondary | ICD-10-CM | POA: Diagnosis not present

## 2018-09-28 DIAGNOSIS — R0902 Hypoxemia: Secondary | ICD-10-CM | POA: Diagnosis not present

## 2018-09-28 DIAGNOSIS — N179 Acute kidney failure, unspecified: Secondary | ICD-10-CM | POA: Diagnosis not present

## 2018-09-28 DIAGNOSIS — M6281 Muscle weakness (generalized): Secondary | ICD-10-CM | POA: Diagnosis present

## 2018-09-28 DIAGNOSIS — I5023 Acute on chronic systolic (congestive) heart failure: Secondary | ICD-10-CM | POA: Diagnosis not present

## 2018-09-28 DIAGNOSIS — I5032 Chronic diastolic (congestive) heart failure: Secondary | ICD-10-CM | POA: Diagnosis not present

## 2018-09-28 DIAGNOSIS — N184 Chronic kidney disease, stage 4 (severe): Secondary | ICD-10-CM | POA: Diagnosis present

## 2018-09-28 DIAGNOSIS — E1165 Type 2 diabetes mellitus with hyperglycemia: Secondary | ICD-10-CM | POA: Diagnosis not present

## 2018-09-28 DIAGNOSIS — Z823 Family history of stroke: Secondary | ICD-10-CM

## 2018-09-28 HISTORY — DX: Patent foramen ovale: Q21.12

## 2018-09-28 HISTORY — DX: Long term (current) use of anticoagulants: Z79.01

## 2018-09-28 HISTORY — DX: Hyperlipidemia, unspecified: E78.5

## 2018-09-28 HISTORY — DX: Atrial septal defect: Q21.1

## 2018-09-28 LAB — MAGNESIUM: Magnesium: 2 mg/dL (ref 1.7–2.4)

## 2018-09-28 LAB — CBC WITH DIFFERENTIAL/PLATELET
Abs Immature Granulocytes: 0.11 10*3/uL — ABNORMAL HIGH (ref 0.00–0.07)
Basophils Absolute: 0.1 10*3/uL (ref 0.0–0.1)
Basophils Relative: 0 %
Eosinophils Absolute: 0 10*3/uL (ref 0.0–0.5)
Eosinophils Relative: 0 %
HCT: 38.7 % (ref 36.0–46.0)
Hemoglobin: 12 g/dL (ref 12.0–15.0)
Immature Granulocytes: 1 %
Lymphocytes Relative: 4 %
Lymphs Abs: 0.7 10*3/uL (ref 0.7–4.0)
MCH: 27.3 pg (ref 26.0–34.0)
MCHC: 31 g/dL (ref 30.0–36.0)
MCV: 88.2 fL (ref 80.0–100.0)
Monocytes Absolute: 1.1 10*3/uL — ABNORMAL HIGH (ref 0.1–1.0)
Monocytes Relative: 6 %
Neutro Abs: 15.6 10*3/uL — ABNORMAL HIGH (ref 1.7–7.7)
Neutrophils Relative %: 89 %
Platelets: 237 10*3/uL (ref 150–400)
RBC: 4.39 MIL/uL (ref 3.87–5.11)
RDW: 14 % (ref 11.5–15.5)
WBC: 17.5 10*3/uL — ABNORMAL HIGH (ref 4.0–10.5)
nRBC: 0 % (ref 0.0–0.2)

## 2018-09-28 LAB — URINALYSIS, ROUTINE W REFLEX MICROSCOPIC
Bilirubin Urine: NEGATIVE
Glucose, UA: >=500 mg/dL — AB
Hgb urine dipstick: NEGATIVE
Ketones, ur: NEGATIVE mg/dL
Nitrite: NEGATIVE
Protein, ur: NEGATIVE mg/dL
Specific Gravity, Urine: 1.007 (ref 1.005–1.030)
pH: 5 (ref 5.0–8.0)

## 2018-09-28 LAB — BASIC METABOLIC PANEL
Anion gap: 11 (ref 5–15)
BUN: 40 mg/dL — ABNORMAL HIGH (ref 8–23)
CO2: 26 mmol/L (ref 22–32)
Calcium: 9.2 mg/dL (ref 8.9–10.3)
Chloride: 96 mmol/L — ABNORMAL LOW (ref 98–111)
Creatinine, Ser: 1.89 mg/dL — ABNORMAL HIGH (ref 0.44–1.00)
GFR calc Af Amer: 28 mL/min — ABNORMAL LOW (ref >60)
GFR calc non Af Amer: 24 mL/min — ABNORMAL LOW (ref >60)
Glucose, Bld: 322 mg/dL — ABNORMAL HIGH (ref 70–99)
Potassium: 4 mmol/L (ref 3.5–5.1)
Sodium: 133 mmol/L — ABNORMAL LOW (ref 135–145)

## 2018-09-28 LAB — SARS CORONAVIRUS 2 (TAT 6-24 HRS): SARS Coronavirus 2: NEGATIVE

## 2018-09-28 LAB — GLUCOSE, CAPILLARY
Glucose-Capillary: 327 mg/dL — ABNORMAL HIGH (ref 70–99)
Glucose-Capillary: 273 mg/dL — ABNORMAL HIGH (ref 70–99)

## 2018-09-28 LAB — TROPONIN I (HIGH SENSITIVITY)
Troponin I (High Sensitivity): 30 ng/L — ABNORMAL HIGH (ref <18)
Troponin I (High Sensitivity): 26 ng/L — ABNORMAL HIGH (ref <18)

## 2018-09-28 LAB — BRAIN NATRIURETIC PEPTIDE: B Natriuretic Peptide: 1053 pg/mL — ABNORMAL HIGH (ref 0.0–100.0)

## 2018-09-28 LAB — TSH: TSH: 0.828 u[IU]/mL (ref 0.350–4.500)

## 2018-09-28 LAB — HEMOGLOBIN A1C
Hgb A1c MFr Bld: 9.3 % — ABNORMAL HIGH (ref 4.8–5.6)
Mean Plasma Glucose: 220.21 mg/dL

## 2018-09-28 MED ORDER — DILTIAZEM HCL 100 MG IV SOLR
5.0000 mg/h | INTRAVENOUS | Status: DC
  Administered 2018-09-28: 5 mg/h via INTRAVENOUS
  Filled 2018-09-28: qty 100

## 2018-09-28 MED ORDER — ACETAMINOPHEN 325 MG PO TABS
650.0000 mg | ORAL_TABLET | Freq: Four times a day (QID) | ORAL | Status: DC | PRN
  Administered 2018-09-30 – 2018-10-05 (×7): 650 mg via ORAL
  Filled 2018-09-28 (×7): qty 2

## 2018-09-28 MED ORDER — SODIUM CHLORIDE 0.9% FLUSH
3.0000 mL | Freq: Two times a day (BID) | INTRAVENOUS | Status: DC
  Administered 2018-09-28 – 2018-10-06 (×15): 3 mL via INTRAVENOUS

## 2018-09-28 MED ORDER — VENLAFAXINE HCL ER 75 MG PO CP24
75.0000 mg | ORAL_CAPSULE | Freq: Every day | ORAL | Status: DC
  Administered 2018-09-29 – 2018-10-06 (×8): 75 mg via ORAL
  Filled 2018-09-28 (×8): qty 1

## 2018-09-28 MED ORDER — ALBUTEROL SULFATE (2.5 MG/3ML) 0.083% IN NEBU: 2.5000 mg | INHALATION_SOLUTION | RESPIRATORY_TRACT | Status: DC | PRN

## 2018-09-28 MED ORDER — ATORVASTATIN CALCIUM 40 MG PO TABS
40.0000 mg | ORAL_TABLET | Freq: Every evening | ORAL | Status: DC
  Administered 2018-09-28 – 2018-10-05 (×8): 40 mg via ORAL
  Filled 2018-09-28 (×8): qty 1

## 2018-09-28 MED ORDER — ONDANSETRON HCL 4 MG PO TABS: 4.0000 mg | ORAL_TABLET | Freq: Four times a day (QID) | ORAL | Status: DC | PRN

## 2018-09-28 MED ORDER — FUROSEMIDE 10 MG/ML IJ SOLN
40.0000 mg | Freq: Once | INTRAMUSCULAR | Status: AC
  Administered 2018-09-28: 40 mg via INTRAVENOUS
  Filled 2018-09-28: qty 4

## 2018-09-28 MED ORDER — INSULIN ASPART 100 UNIT/ML ~~LOC~~ SOLN
0.0000 [IU] | Freq: Every day | SUBCUTANEOUS | Status: DC
  Administered 2018-09-28 – 2018-10-03 (×4): 3 [IU] via SUBCUTANEOUS

## 2018-09-28 MED ORDER — INSULIN GLARGINE 100 UNIT/ML ~~LOC~~ SOLN
44.0000 [IU] | Freq: Every day | SUBCUTANEOUS | Status: DC
  Administered 2018-09-28: 44 [IU] via SUBCUTANEOUS
  Filled 2018-09-28 (×3): qty 0.44

## 2018-09-28 MED ORDER — SODIUM CHLORIDE 0.9 % IV SOLN: 250.0000 mL | INTRAVENOUS | Status: DC | PRN

## 2018-09-28 MED ORDER — INSULIN ASPART 100 UNIT/ML ~~LOC~~ SOLN
0.0000 [IU] | Freq: Three times a day (TID) | SUBCUTANEOUS | Status: DC
  Administered 2018-09-28: 7 [IU] via SUBCUTANEOUS
  Administered 2018-09-29: 08:00:00 1 [IU] via SUBCUTANEOUS
  Administered 2018-09-29: 3 [IU] via SUBCUTANEOUS
  Administered 2018-09-29: 2 [IU] via SUBCUTANEOUS
  Administered 2018-09-30: 18:00:00 3 [IU] via SUBCUTANEOUS
  Administered 2018-09-30: 12:00:00 5 [IU] via SUBCUTANEOUS
  Administered 2018-09-30 – 2018-10-01 (×2): 1 [IU] via SUBCUTANEOUS
  Administered 2018-10-02: 12:00:00 3 [IU] via SUBCUTANEOUS
  Administered 2018-10-02: 2 [IU] via SUBCUTANEOUS
  Administered 2018-10-03: 13:00:00 6 [IU] via SUBCUTANEOUS
  Administered 2018-10-03: 18:00:00 1 [IU] via SUBCUTANEOUS
  Administered 2018-10-04 (×2): 3 [IU] via SUBCUTANEOUS
  Administered 2018-10-05: 09:00:00 2 [IU] via SUBCUTANEOUS
  Administered 2018-10-05: 3 [IU] via SUBCUTANEOUS
  Administered 2018-10-06: 2 [IU] via SUBCUTANEOUS
  Administered 2018-10-06: 09:00:00 1 [IU] via SUBCUTANEOUS
  Filled 2018-09-28: qty 1

## 2018-09-28 MED ORDER — TRAZODONE HCL 50 MG PO TABS
100.0000 mg | ORAL_TABLET | Freq: Every day | ORAL | Status: DC
  Administered 2018-09-28 – 2018-10-05 (×7): 100 mg via ORAL
  Filled 2018-09-28 (×7): qty 2

## 2018-09-28 MED ORDER — MAGNESIUM OXIDE 400 (241.3 MG) MG PO TABS
400.0000 mg | ORAL_TABLET | Freq: Every evening | ORAL | Status: DC
  Administered 2018-09-28 – 2018-10-05 (×8): 400 mg via ORAL
  Filled 2018-09-28 (×16): qty 1

## 2018-09-28 MED ORDER — ONDANSETRON HCL 4 MG/2ML IJ SOLN: 4.0000 mg | Freq: Four times a day (QID) | INTRAMUSCULAR | Status: DC | PRN

## 2018-09-28 MED ORDER — CITALOPRAM HYDROBROMIDE 20 MG PO TABS
20.0000 mg | ORAL_TABLET | Freq: Every day | ORAL | Status: DC
  Administered 2018-09-29 – 2018-10-06 (×8): 20 mg via ORAL
  Filled 2018-09-28 (×8): qty 1

## 2018-09-28 MED ORDER — ADULT MULTIVITAMIN W/MINERALS CH
1.0000 | ORAL_TABLET | Freq: Every day | ORAL | Status: DC
  Administered 2018-09-28 – 2018-10-05 (×8): 1 via ORAL
  Filled 2018-09-28 (×8): qty 1

## 2018-09-28 MED ORDER — OCUVITE-LUTEIN PO CAPS
1.0000 | ORAL_CAPSULE | Freq: Two times a day (BID) | ORAL | Status: DC
  Administered 2018-09-28 – 2018-10-06 (×15): 1 via ORAL
  Filled 2018-09-28 (×15): qty 1

## 2018-09-28 MED ORDER — ACETAMINOPHEN 650 MG RE SUPP: 650.0000 mg | Freq: Four times a day (QID) | RECTAL | Status: DC | PRN

## 2018-09-28 MED ORDER — SODIUM CHLORIDE 0.9 % IV SOLN
1.0000 g | INTRAVENOUS | Status: DC
  Administered 2018-09-28: 1 g via INTRAVENOUS
  Filled 2018-09-28: qty 10

## 2018-09-28 MED ORDER — PANTOPRAZOLE SODIUM 40 MG PO TBEC
40.0000 mg | DELAYED_RELEASE_TABLET | Freq: Every day | ORAL | Status: DC
  Administered 2018-09-29 – 2018-10-06 (×8): 40 mg via ORAL
  Filled 2018-09-28 (×8): qty 1

## 2018-09-28 MED ORDER — POLYETHYLENE GLYCOL 3350 17 G PO PACK: 17.0000 g | PACK | Freq: Every day | ORAL | Status: DC | PRN

## 2018-09-28 MED ORDER — PIOGLITAZONE HCL 15 MG PO TABS
15.0000 mg | ORAL_TABLET | Freq: Every evening | ORAL | Status: DC
  Administered 2018-09-28: 15 mg via ORAL
  Filled 2018-09-28 (×2): qty 1

## 2018-09-28 MED ORDER — SODIUM CHLORIDE 0.9 % IV BOLUS
500.0000 mL | Freq: Once | INTRAVENOUS | Status: AC
  Administered 2018-09-28: 500 mL via INTRAVENOUS

## 2018-09-28 MED ORDER — APIXABAN 2.5 MG PO TABS
2.5000 mg | ORAL_TABLET | Freq: Two times a day (BID) | ORAL | Status: DC
  Administered 2018-09-28 – 2018-10-06 (×16): 2.5 mg via ORAL
  Filled 2018-09-28 (×16): qty 1

## 2018-09-28 MED ORDER — DILTIAZEM HCL 30 MG PO TABS
30.0000 mg | ORAL_TABLET | Freq: Four times a day (QID) | ORAL | Status: DC
  Administered 2018-09-28 – 2018-09-29 (×3): 30 mg via ORAL
  Filled 2018-09-28 (×3): qty 1

## 2018-09-28 MED ORDER — SODIUM CHLORIDE 0.9% FLUSH: 3.0000 mL | INTRAVENOUS | Status: DC | PRN

## 2018-09-28 MED ORDER — FUROSEMIDE 10 MG/ML IJ SOLN
20.0000 mg | Freq: Two times a day (BID) | INTRAMUSCULAR | Status: DC
  Administered 2018-09-29 (×2): 20 mg via INTRAVENOUS
  Filled 2018-09-28 (×3): qty 2

## 2018-09-28 MED ORDER — DILTIAZEM LOAD VIA INFUSION
10.0000 mg | Freq: Once | INTRAVENOUS | Status: AC
  Administered 2018-09-28: 10 mg via INTRAVENOUS
  Filled 2018-09-28: qty 10

## 2018-09-28 MED ORDER — METOPROLOL TARTRATE 25 MG PO TABS
25.0000 mg | ORAL_TABLET | Freq: Two times a day (BID) | ORAL | Status: DC
  Administered 2018-09-28 (×2): 25 mg via ORAL
  Filled 2018-09-28 (×3): qty 1

## 2018-09-28 MED ORDER — METOPROLOL TARTRATE 25 MG PO TABS: 25.0000 mg | ORAL_TABLET | Freq: Two times a day (BID) | ORAL | Status: DC

## 2018-09-28 MED ORDER — TRAZODONE HCL 50 MG PO TABS: 50.0000 mg | ORAL_TABLET | Freq: Every evening | ORAL | Status: DC | PRN

## 2018-09-28 NOTE — ED Provider Notes (Signed)
[facility] EMERGENCY DEPARTMENT Provider Note   CSN: [id number] Arrival date & time: 09/28/18  1143     History   Chief Complaint Chief Complaint  Patient presents with  . Weakness    HPI Amy Wiggins is a 85 y.o. female.     HPI  Pt was seen at 1205. Per EMS and pt report: Pt c/o gradual onset and worsening of persistent generalized weakness for the past several weeks, worse over the past week. Has been associated with SOB while laying flat for the past 1 week. Pt states she was unable to stand today due to generalized weakness. EMS states pt's BP was "80/60" on scene, was given IV NS 56ml with improvement. Denies CP/palpitations, no cough, no fevers, no rash, no abd pain, no N/V/D, no focal motor weakness, no tingling/numbness in extremities.    Past Medical History:  Diagnosis Date  . Chronic anticoagulation   . Depression   . Diabetes mellitus   . Hyperlipidemia   . Hypertension   . PAF (paroxysmal atrial fibrillation) ([place])   . PFO (patent foramen ovale)   . Stroke ([place])    TIA's x 2    Patient Active Problem List   Diagnosis Date Noted  . Cerebellar infarct ([place])   . Stroke ([place]) 02/22/2016  . Ischemic stroke ([place]) 02/22/2016  . Chronic diastolic congestive heart failure ([place]) 02/22/2016  . AKI (acute kidney injury) ([place]) 02/22/2016  . Diabetes mellitus with complication ([place])   . Shortness of breath 01/14/2016  . Wheezing 01/14/2016  . Paroxysmal atrial fibrillation ([place]) 11/14/2015  . Chronic anticoagulation 11/14/2015  . Cerebrovascular accident (CVA) due to embolism of right posterior cerebral artery ([place]) 08/08/2015  . Essential hypertension 08/08/2015  . HLD (hyperlipidemia) 08/08/2015  . Type 2 diabetes mellitus with circulatory disorder ([place]) 08/08/2015  . PFO (patent foramen ovale)   . ARF (acute renal failure) ([place]) 06/04/2015  . Cerebral infarction due to unspecified mechanism   . Acute CVA (cerebrovascular accident) ([place]) 06/02/2015  .  Hypertension 06/02/2015  . Hyperlipidemia 06/02/2015  . CKD (chronic kidney disease) stage 4, GFR 15-29 ml/min (HCC) 06/02/2015  . DM type 2 (diabetes mellitus, type 2) ([place]) 06/02/2015  . Leukocytosis 06/02/2015  . Muscle weakness (generalized) 09/16/2012  . Pain in joint, shoulder region 09/16/2012  . Closed displaced fracture of lateral end of clavicle 08/18/2012    Past Surgical History:  Procedure Laterality Date  . COLONOSCOPY    . COLONOSCOPY N/A 09/09/2014   Procedure: COLONOSCOPY;  Surgeon: Rogene Houston, MD;  Location: [facility];  Service: Endoscopy;  Laterality: N/A;  1010 - moved to 7:30 - Ann to notify  . EP IMPLANTABLE DEVICE N/A 06/07/2015   Procedure: Loop Recorder Insertion;  Surgeon: Deboraha Sprang, MD;  Location: [facility] LAB;  Service: Cardiovascular;  Laterality: N/A;  . TEE WITHOUT CARDIOVERSION N/A 06/07/2015   Procedure: TRANSESOPHAGEAL ECHOCARDIOGRAM (TEE);  Surgeon: Thayer Headings, MD;  Location: [facility] ENDOSCOPY;  Service: Cardiovascular;  Laterality: N/A;     OB History   No obstetric history on file.      Home Medications    Prior to Admission medications   Medication Sig Start Date End Date Taking? Authorizing Provider  acetaminophen (TYLENOL) 500 MG tablet Take 1,000 mg by mouth See admin instructions. Take 2 tablets (1000 mg) by mouth daily at bedtime, may also take 2 tablets during the day as needed for pain    [provider]  atorvastatin (LIPITOR)  20 MG tablet Take 2 tablets (40 mg total) by mouth every evening. 4pm 02/24/16   Caren Griffins, MD  chlorthalidone (HYGROTON) 25 MG tablet Take 25 mg by mouth daily. For high blood pressure/fluid 01/03/13   [provider]  citalopram (CELEXA) 20 MG tablet  04/01/17   [provider]  ELIQUIS 2.5 MG TABS tablet Take 2.5 mg by mouth 2 (two) times daily.  04/01/17   [provider]  LANTUS SOLOSTAR 100 UNIT/ML Solostar Pen  02/27/17   [provider]   losartan (COZAAR) 100 MG tablet Take 50 mg by mouth every evening. For high blood pressure - 4pm    [provider]  magnesium oxide (MAG-OX) 400 MG tablet Take 400 mg by mouth every evening. 4pm    [provider]  metoprolol tartrate (LOPRESSOR) 25 MG tablet Take 1 tablet (25 mg total) by mouth 2 (two) times daily. 01/16/16   Charlynne Cousins, MD  Multiple Vitamin (MULTIVITAMIN WITH MINERALS) TABS tablet Take 1 tablet by mouth at bedtime. For supplement    [provider]  Multiple Vitamins-Minerals (PRESERVISION AREDS 2) CAPS Take 1 capsule by mouth 2 (two) times daily.    [provider]  NOVOFINE 32G X 6 MM MISC  02/24/17   [provider]  pantoprazole (PROTONIX) 40 MG tablet Take 40 mg by mouth daily. For acid reflux/gerd    [provider]  pioglitazone (ACTOS) 15 MG tablet Take 15 mg by mouth every evening. 4pm    [provider]  traZODone (DESYREL) 100 MG tablet Take 100 mg by mouth at bedtime.    [provider]  venlafaxine XR (EFFEXOR-XR) 75 MG 24 hr capsule Take 75 mg by mouth daily with breakfast.    [provider]    Family History Family History  Problem Relation Age of Onset  . Stroke Father   . Dementia Sister     Social History Social History   Tobacco Use  . Smoking status: Never Smoker  . Smokeless tobacco: Never Used  Substance Use Topics  . Alcohol use: No  . Drug use: No     Allergies   Patient has no known allergies.   Review of Systems Review of Systems ROS: Statement: All systems negative except as marked or noted in the HPI; Constitutional: Negative for fever and chills. +generalized weakness.; ; Eyes: Negative for eye pain, redness and discharge. ; ; ENMT: Negative for ear pain, hoarseness, nasal congestion, sinus pressure and sore throat. ; ; Cardiovascular: Negative for chest pain, palpitations, diaphoresis, and peripheral edema. ; ; Respiratory: +SOB. Negative  for cough, wheezing and stridor. ; ; Gastrointestinal: Negative for nausea, vomiting, diarrhea, abdominal pain, blood in stool, hematemesis, jaundice and rectal bleeding. . ; ; Genitourinary: Negative for dysuria, flank pain and hematuria. ; ; Musculoskeletal: Negative for back pain and neck pain. Negative for swelling and trauma.; ; Skin: Negative for pruritus, rash, abrasions, blisters, bruising and skin lesion.; ; Neuro: Negative for headache, lightheadedness and neck stiffness. Negative for altered level of consciousness, altered mental status, extremity weakness, paresthesias, involuntary movement, seizure and syncope.       Physical Exam Updated Vital Signs BP 103/65 (BP Location: Left Arm)   Pulse 70   Temp (!) 97.5 F (36.4 C) (Oral)   Resp (!) 23   Ht 5\' 4"  (1.626 m)   Wt 90.7 kg   SpO2 95%   BMI 34.33 kg/m    Patient Vitals for the  past 24 hrs:  BP Temp Temp src Pulse Resp SpO2 Height Weight  09/28/18 1345 113/85 - - (!) 105 (!) 23 93 % - -  09/28/18 1330 113/88 - - (!) 104 19 94 % - -  09/28/18 1315 111/88 - - (!) 103 (!) 21 93 % - -  09/28/18 1300 109/77 - - 71 19 96 % - -  09/28/18 1222 - - - 70 (!) 23 95 % - -  09/28/18 1210 103/65 (!) 97.5 F (36.4 C) Oral (!) 141 19 94 % 5\' 4"  (1.626 m) 90.7 kg  09/28/18 1205 - - - - - 90 % - -  09/28/18 1202 - - - - - - 5\' 4"  (1.626 m) 90.7 kg      Physical Exam 1210: Physical examination:  Nursing notes reviewed; Vital signs and O2 SAT reviewed;  Constitutional: Well developed, Well nourished, In no acute distress; Head:  Normocephalic, atraumatic; Eyes: EOMI, PERRL, No scleral icterus; ENMT: Mouth and pharynx normal, Mucous membranes dry; Neck: Supple, Full range of motion, No lymphadenopathy; Cardiovascular: Irregular tachycardic rate and rhythm, No gallop; Respiratory: Breath sounds coarse & equal bilaterally, No wheezes.  Speaking full sentences with ease, Normal respiratory effort/excursion; Chest: Nontender, Movement normal;  Abdomen: Soft, Nontender, Nondistended, Normal bowel sounds; Genitourinary: No CVA tenderness; Extremities: Peripheral pulses normal, No tenderness, No edema, No calf edema or asymmetry.; Neuro: AA&Ox3, tangential historian. Major CN grossly intact. No facial droop. Speech clear. No gross focal motor or sensory deficits in extremities.; Skin: Color normal, Warm, Dry.    ED Treatments / Results  Labs (all labs ordered are listed, but only abnormal results are displayed)   EKG EKG Interpretation  Date/Time:  Monday September 28 2018 12:06:36 EDT Ventricular Rate:  145 PR Interval:    QRS Duration: 105 QT Interval:  319 QTC Calculation: 451 R Axis:   -67 Text Interpretation:  Atrial fibrillation with rapid V-rate Ventricular premature complex Incomplete RBBB and LAFB RSR' in V1 or V2, right VCD or RVH Baseline wander Artifact When compared with ECG of 01/14/2016 Atrial fibrillation has replaced Sinus tachycardia Confirmed by Francine Graven [phone]) on 09/28/2018 12:18:35 PM   Radiology   Procedures Procedures (including critical care time)  Medications Ordered in ED Medications  diltiazem (CARDIZEM) 1 mg/mL load via infusion 10 mg (has no administration in time range)    And  diltiazem (CARDIZEM) 100 mg in dextrose 5 % 100 mL (1 mg/mL) infusion (has no administration in time range)  sodium chloride 0.9 % bolus 500 mL (500 mLs Intravenous New Bag/Given 09/28/18 1222)     Initial Impression / Assessment and Plan / ED Course  I have reviewed the triage vital signs and the nursing notes.  Pertinent labs & imaging results that were available during my care of the patient were reviewed by me and considered in my medical decision making (see chart for details).     MDM Reviewed: previous chart, nursing note and vitals Reviewed previous: labs and ECG Interpretation: labs, ECG and x-ray Total time providing critical care: 30-74 minutes. This excludes time spent performing separately  reportable procedures and services. Consults: admitting MD and cardiology   CRITICAL CARE Performed by: Francine Graven Total critical care time: 45 minutes Critical care time was exclusive of separately billable procedures and treating other patients. Critical care was necessary to treat or prevent imminent or life-threatening deterioration. Critical care was time spent personally by me on the following activities: development of treatment plan with patient  and/or surrogate as well as nursing, discussions with consultants, evaluation of patient's response to treatment, examination of patient, obtaining history from patient or surrogate, ordering and performing treatments and interventions, ordering and review of laboratory studies, ordering and review of radiographic studies, pulse oximetry and re-evaluation of patient's condition.   Results for orders placed or performed during the hospital encounter of 51/76/16  Basic metabolic panel  Result Value Ref Range   Sodium 133 (L) 135 - 145 mmol/L   Potassium 4.0 3.5 - 5.1 mmol/L   Chloride 96 (L) 98 - 111 mmol/L   CO2 26 22 - 32 mmol/L   Glucose, Bld 322 (H) 70 - 99 mg/dL   BUN 40 (H) 8 - 23 mg/dL   Creatinine, Ser 1.89 (H) 0.44 - 1.00 mg/dL   Calcium 9.2 8.9 - 10.3 mg/dL   GFR calc non Af Amer 24 (L) >60 mL/min   GFR calc Af Amer 28 (L) >60 mL/min   Anion gap 11 5 - 15  Brain natriuretic peptide  Result Value Ref Range   B Natriuretic Peptide 1,053.0 (H) 0.0 - 100.0 pg/mL  CBC with Differential  Result Value Ref Range   WBC 17.5 (H) 4.0 - 10.5 K/uL   RBC 4.39 3.87 - 5.11 MIL/uL   Hemoglobin 12.0 12.0 - 15.0 g/dL   HCT 38.7 36.0 - 46.0 %   MCV 88.2 80.0 - 100.0 fL   MCH 27.3 26.0 - 34.0 pg   MCHC 31.0 30.0 - 36.0 g/dL   RDW 14.0 11.5 - 15.5 %   Platelets 237 150 - 400 K/uL   nRBC 0.0 0.0 - 0.2 %   Neutrophils Relative % 89 %   Neutro Abs 15.6 (H) 1.7 - 7.7 K/uL   Lymphocytes Relative 4 %   Lymphs Abs 0.7 0.7 - 4.0 K/uL    Monocytes Relative 6 %   Monocytes Absolute 1.1 (H) 0.1 - 1.0 K/uL   Eosinophils Relative 0 %   Eosinophils Absolute 0.0 0.0 - 0.5 K/uL   Basophils Relative 0 %   Basophils Absolute 0.1 0.0 - 0.1 K/uL   Immature Granulocytes 1 %   Abs Immature Granulocytes 0.11 (H) 0.00 - 0.07 K/uL  Troponin I (High Sensitivity)  Result Value Ref Range   Troponin I (High Sensitivity) 30 (H) <18 ng/L   Dg Chest Port 1 View Result Date: 09/28/2018 CLINICAL DATA:  Generalized weakness today. Low blood pressure. History of diabetes and hypertension. EXAM: PORTABLE CHEST 1 VIEW COMPARISON:  Radiographs 02/10/2017 and 01/14/2016. FINDINGS: 1228 hours. Stable cardiomegaly with mild vascular congestion. There is no overt pulmonary edema, confluent airspace opacity, pleural effusion or pneumothorax. Loop recorder overlies the lower left chest. No acute osseous findings are evident. Telemetry leads overlie the chest. IMPRESSION: Cardiomegaly with mild vascular congestion.  No edema. Electronically Signed   By: Richardean Sale M.D.   On: 09/28/2018 12:41   Amy Wiggins was evaluated in Emergency Department on 09/28/2018 for the symptoms described in the history of present illness. She was evaluated in the context of the global COVID-19 pandemic, which necessitated consideration that the patient might be at risk for infection with the SARS-CoV-2 virus that causes COVID-19. Institutional protocols and algorithms that pertain to the evaluation of patients at risk for COVID-19 are in a state of rapid change based on information released by regulatory bodies including the CDC and federal and state organizations. These policies and algorithms were followed during the patient's care in the ED.  1405:  Monitor with afib/RVR, rates to 150. SBP 90-100. Judicious IVF given with IV cardizem bolus and gtt. SBP now increased to 110's and HR decreased to 100's. BNP elevated with pulmonary vascular congestion on CXR; IV lasix given.  BUN/Cr elevated from most recent baseline. T/C returned from [facility] Dr. Bronson Ing, case discussed, including:  HPI, pertinent PM/SHx, VS/PE, dx testing, ED course and treatment:  Admit to [facility] service, agreeable to consult if needed.  1440:  T/C returned from [facility] Dr. [name], case discussed, including:  HPI, pertinent PM/SHx, VS/PE, dx testing, ED course and treatment:  Agreeable to admit.    Final Clinical Impressions(s) / ED Diagnoses   Final diagnoses:  None    ED Discharge Orders    None       Francine Graven, DO 09/30/18 [id number]

## 2018-09-28 NOTE — H&P (Signed)
Patient Demographics:    Amy Wiggins, is a 85 y.o. female  MRN: [id number]   DOB - May 28, 1931  Admit Date - 09/28/2018  Outpatient Primary MD for the patient is Asencion Noble, MD   Assessment & Plan:    Principal Problem:   Chronic a-fib with RVR Active Problems:   Hypotension   Muscle weakness (generalized)   CKD (chronic kidney disease) stage 4, GFR 15-29 ml/min (HCC)   DM type 2 (diabetes mellitus, type 2) ([place])   Cerebrovascular accident (CVA) due to embolism of right posterior cerebral artery (HCC)   Chronic anticoagulation   Hypertension   HFpEF/Chronic diastolic congestive heart failure (HCC)    1) A. fib with RVR----patient with history of chronic atrial fibrillation, noncompliance with medications noted--- HR in 140s in ED responded well to IV Cardizem in the ED--- transition to oral Cardizem and metoprolol with hold parameters-continue Eliquis for anticoagulation. -s/P prior loop recorder implant 02/17/2016, prior TEE 06/07/15 with PFO  -Check TSH -Potassium is 4.0 magnesium is 2.0  2)HFpEF--patient with history of chronic diastolic dysfunction CHF, last known EF over 55%, suspect elevated BNP and chest x-ray findings are related to #1 above -Borderline troponin elevation also related to #1 above (demand ischemia in the setting of tachyarrhythmia) --Metoprolol as ordered -Consider Lasix for diuresis if blood pressure tolerates -Daily weight and fluid input and output charting  3) possible UTI--- leukocytosis noted, no fevers, tachycardia is related to A. fib with RVR -Doubt frank sepsis --Treat empirically with IV Rocephin pending cultures  4)DM2-A1c is 9.3 reflecting uncontrolled DM, patient with history of noncompliance, continue Lantus insulin, Actos along with sliding scale coverage  5)H/o  Prior Stroke --- stable, patient with history of chronic A. fib, no evidence of acute strokes at this time, continue Eliquis for secondary stroke prophylaxis, continue Lipitor  6)AKI----acute kidney injury on CKD stage - III, worsening renal function due to poor perfusion in the setting of hypotension and UTI   creatinine on admission= 1.9  ,   baseline creatinine =1.5   , -Hold losartan, hold chlorthalidone  renally adjust medications, avoid nephrotoxic agents / dehydration / hypotension  7)Transient Hypotension --- due to #1 above, improved with IV fluid -Borderline troponin as noted above  8) depression/insomnia--stable, continue trazodone and Effexor  9)Social--- patient with poor compliance most likely due to forgetfulness/memory deficits in the setting of presumed dementia-- Strategies to improve medication compliance discussed with patient's caregiver/granddaughter [name]  With History of - Reviewed by me  Past Medical History:  Diagnosis Date  . Chronic anticoagulation   . Depression   . Diabetes mellitus   . Hyperlipidemia   . Hypertension   . PAF (paroxysmal atrial fibrillation) ([place])   . PFO (patent foramen ovale)   . Stroke ([place])    TIA's x 2      Past Surgical History:  Procedure Laterality Date  . COLONOSCOPY    . COLONOSCOPY N/A 09/09/2014  Procedure: COLONOSCOPY;  Surgeon: Rogene Houston, MD;  Location[facility];  Service: Endoscopy;  Laterality: N/A;  1010 - moved to 7:30 - Ann to notify  . EP IMPLANTABLE DEVICE N/A 06/07/2015   Procedure: Loop Recorder Insertion;  Surgeon: Deboraha Sprang, MD;  Location: [facility];  Service: Cardiovascular;  Laterality: N/A;  . TEE WITHOUT CARDIOVERSION N/A 06/07/2015   Procedure: TRANSESOPHAGEAL ECHOCARDIOGRAM (TEE);  Surgeon: Thayer Headings, MD;  Location: [place];  Service: Cardiovascular;  Laterality: N/A;      Chief Complaint  Patient presents with  . Weakness      HPI:     Amy Wiggins  is a 85 y.o. female  with medical history significant of depression, diabetes, hypertension, prior stroke, chronic atrial fibrillation on Eliquis, S/P loop recorder implant 02/17/2016, prior TEE 06/07/15 with PFO who presents to the ED with generalized weakness and hypotension with systolic blood pressure around 80- BP improved after IV fluid bolus --Patient is a poor historian --Additional history obtained from patient's granddaughter Ms Amy Wiggins (works as an Secretary/administrator) and lives with the patient --According to granddaughter patient is not always compliant with her a.m. medications --She denies frank chest pains --- patient was apparently out in the store on Friday, 09/24/2018--- she came back very tired and weak, weakness and fatigue persisted since then --Did have some shortness of breath and dizziness In ED--she is found to be in A. fib with RVR with heart rate in the 140s--heart rate responded well to IV Cardizem initially  --Chest x-ray noted to have mild vascular congestion without pulmonary edema -BNP is elevated at 1053 from a baseline around 120  COvid 19 test is pending -UA suspicious for UTI,  WBC 17.5, no fevers  -Highly sensitive troponin is borderline at 30,  repeat 26 --Creatinine is up to 1.89 from a baseline of 1.5 -Potassium is 4.0 magnesium is 2.0    Review of systems:    In addition to the HPI above,   A full Review of  Systems was done, all other systems reviewed are negative except as noted above in HPI , .    Social History:  Reviewed by me    Social History   Tobacco Use  . Smoking status: Never Smoker  . Smokeless tobacco: Never Used  Substance Use Topics  . Alcohol use: No      Family History :  Reviewed by me    Family History  Problem Relation Age of Onset  . Stroke Father   . Dementia Sister     Home Medications:   Prior to Admission medications   Medication Sig Start Date End Date Taking? Authorizing Provider   LANTUS SOLOSTAR 100 UNIT/ML Solostar Pen Inject 44 Units into the skin daily.  02/27/17  Yes [provider]  acetaminophen (TYLENOL) 500 MG tablet Take 1,000 mg by mouth See admin instructions. Take 2 tablets (1000 mg) by mouth daily at bedtime, may also take 2 tablets during the day as needed for pain    [provider]  atorvastatin (LIPITOR) 20 MG tablet Take 2 tablets (40 mg total) by mouth every evening. 4pm 02/24/16   Caren Griffins, MD  chlorthalidone (HYGROTON) 25 MG tablet Take 25 mg by mouth daily. For high blood pressure/fluid 01/03/13   [provider]  citalopram (CELEXA) 20 MG tablet  04/01/17   [provider]  ELIQUIS 2.5 MG TABS tablet Take 2.5 mg by mouth 2 (two) times  daily.  04/01/17   [provider]  losartan (COZAAR) 100 MG tablet Take 50 mg by mouth every evening. For high blood pressure - 4pm    [provider]  magnesium oxide (MAG-OX) 400 MG tablet Take 400 mg by mouth every evening. 4pm    [provider]  metoprolol tartrate (LOPRESSOR) 25 MG tablet Take 1 tablet (25 mg total) by mouth 2 (two) times daily. 01/16/16   Charlynne Cousins, MD  Multiple Vitamin (MULTIVITAMIN WITH MINERALS) TABS tablet Take 1 tablet by mouth at bedtime. For supplement    [provider]  Multiple Vitamins-Minerals (PRESERVISION AREDS 2) CAPS Take 1 capsule by mouth 2 (two) times daily.    [provider]  pantoprazole (PROTONIX) 40 MG tablet Take 40 mg by mouth daily. For acid reflux/gerd    [provider]  pioglitazone (ACTOS) 15 MG tablet Take 15 mg by mouth every evening. 4pm    [provider]  traZODone (DESYREL) 100 MG tablet Take 100 mg by mouth at bedtime.    [provider]  venlafaxine XR (EFFEXOR-XR) 75 MG 24 hr capsule Take 75 mg by mouth daily with breakfast.    [provider]     Allergies:    No Known Allergies   Physical Exam:   Vitals  Blood pressure  97/65, pulse (!) 101, temperature 98.4 F (36.9 C), temperature source Oral, resp. rate 20, height 5\' 4"  (1.626 m), weight 90.7 kg, SpO2 94 %.  Physical Examination: General appearance - alert, well appearing, and in no distress  Mental status - alert, oriented to person, place, and time,  Eyes - sclera anicteric Neck - supple, no JVD elevation , Chest -diminished in bases, no wheezing Heart - S1 and S2 normal, irregularly irregular tachycardic initially Abdomen - soft, nontender, nondistended, no masses or organomegaly Neurological - screening mental status exam normal, neck supple without rigidity, cranial nerves II through XII intact, DTR's normal and symmetric Extremities -   intact peripheral pulses  Skin - warm, dry     Data Review:    CBC Recent Labs  Lab 09/28/18 1256  WBC 17.5*  HGB 12.0  HCT 38.7  PLT 237  MCV 88.2  MCH 27.3  MCHC 31.0  RDW 14.0  LYMPHSABS 0.7  MONOABS 1.1*  EOSABS 0.0  BASOSABS 0.1   ------------------------------------------------------------------------------------------------------------------  Chemistries  Recent Labs  Lab 09/28/18 1256 09/28/18 1505  NA 133*  --   K 4.0  --   CL 96*  --   CO2 26  --   GLUCOSE 322*  --   BUN 40*  --   CREATININE 1.89*  --   CALCIUM 9.2  --   MG  --  2.0   ------------------------------------------------------------------------------------------------------------------ estimated creatinine clearance is 24.2 mL/min (A) (by C-G formula based on SCr of 1.89 mg/dL (H)). ------------------------------------------------------------------------------------------------------------------ No results for input(s): TSH, T4TOTAL, T3FREE, THYROIDAB in the last 72 hours.  Invalid input(s): FREET3   Coagulation profile No results for input(s): INR, PROTIME in the last 168 hours. ------------------------------------------------------------------------------------------------------------------- No results  for input(s): DDIMER in the last 72 hours. -------------------------------------------------------------------------------------------------------------------  Cardiac Enzymes No results for input(s): CKMB, TROPONINI, MYOGLOBIN in the last 168 hours.  Invalid input(s): CK ------------------------------------------------------------------------------------------------------------------    Component Value Date/Time   BNP 1,053.0 (H) 09/28/2018 1256     ---------------------------------------------------------------------------------------------------------------  Urinalysis    Component Value Date/Time   COLORURINE YELLOW 09/28/2018 1216   APPEARANCEUR HAZY (A) 09/28/2018 1216   LABSPEC 1.007 09/28/2018  1216   PHURINE 5.0 09/28/2018 1216   GLUCOSEU >=500 (A) 09/28/2018 1216   HGBUR NEGATIVE 09/28/2018 1216   BILIRUBINUR NEGATIVE 09/28/2018 1216   KETONESUR NEGATIVE 09/28/2018 1216   PROTEINUR NEGATIVE 09/28/2018 1216   UROBILINOGEN 0.2 12/12/2010 1102   NITRITE NEGATIVE 09/28/2018 1216   LEUKOCYTESUR MODERATE (A) 09/28/2018 1216    ----------------------------------------------------------------------------------------------------------------   Imaging Results:    Dg Chest Port 1 View  Result Date: 09/28/2018 CLINICAL DATA:  Generalized weakness today. Low blood pressure. History of diabetes and hypertension. EXAM: PORTABLE CHEST 1 VIEW COMPARISON:  Radiographs 02/10/2017 and 01/14/2016. FINDINGS: 1228 hours. Stable cardiomegaly with mild vascular congestion. There is no overt pulmonary edema, confluent airspace opacity, pleural effusion or pneumothorax. Loop recorder overlies the lower left chest. No acute osseous findings are evident. Telemetry leads overlie the chest. IMPRESSION: Cardiomegaly with mild vascular congestion.  No edema. Electronically Signed   By: Richardean Sale M.D.   On: 09/28/2018 12:41    Radiological Exams on Admission: Dg Chest Port 1 View  Result  Date: 09/28/2018 CLINICAL DATA:  Generalized weakness today. Low blood pressure. History of diabetes and hypertension. EXAM: PORTABLE CHEST 1 VIEW COMPARISON:  Radiographs 02/10/2017 and 01/14/2016. FINDINGS: 1228 hours. Stable cardiomegaly with mild vascular congestion. There is no overt pulmonary edema, confluent airspace opacity, pleural effusion or pneumothorax. Loop recorder overlies the lower left chest. No acute osseous findings are evident. Telemetry leads overlie the chest. IMPRESSION: Cardiomegaly with mild vascular congestion.  No edema. Electronically Signed   By: Richardean Sale M.D.   On: 09/28/2018 12:41    DVT Prophylaxis -SCD  /Eliquis AM Labs Ordered, also please review Full Orders  Family Communication: Admission, patients condition and plan of care including tests being ordered have been discussed with the patient and grand-daughter who indicate understanding and agree with the plan   Code Status - Full Code  Likely DC to  home  Condition   stable Roxan Hockey M.D on 09/28/2018 at 7:09 PM Go to [email or web address] -  for contact info  [facility] - Office  [phone]

## 2018-09-28 NOTE — ED Triage Notes (Signed)
Pt brought in from home for generalized weakness. EMS arrived to find pt with manual bp of 80/60, given 555ml of NS with improvement. Pt has no specific complaint. No focal weakness noted.

## 2018-09-29 ENCOUNTER — Inpatient Hospital Stay (HOSPITAL_COMMUNITY): Payer: Medicare HMO

## 2018-09-29 ENCOUNTER — Observation Stay (HOSPITAL_BASED_OUTPATIENT_CLINIC_OR_DEPARTMENT_OTHER): Payer: Medicare HMO

## 2018-09-29 DIAGNOSIS — Z8673 Personal history of transient ischemic attack (TIA), and cerebral infarction without residual deficits: Secondary | ICD-10-CM | POA: Diagnosis not present

## 2018-09-29 DIAGNOSIS — I5031 Acute diastolic (congestive) heart failure: Secondary | ICD-10-CM

## 2018-09-29 DIAGNOSIS — I509 Heart failure, unspecified: Secondary | ICD-10-CM | POA: Diagnosis not present

## 2018-09-29 DIAGNOSIS — M25572 Pain in left ankle and joints of left foot: Secondary | ICD-10-CM | POA: Diagnosis present

## 2018-09-29 DIAGNOSIS — N184 Chronic kidney disease, stage 4 (severe): Secondary | ICD-10-CM | POA: Diagnosis present

## 2018-09-29 DIAGNOSIS — J9601 Acute respiratory failure with hypoxia: Secondary | ICD-10-CM | POA: Diagnosis present

## 2018-09-29 DIAGNOSIS — Z79899 Other long term (current) drug therapy: Secondary | ICD-10-CM | POA: Diagnosis not present

## 2018-09-29 DIAGNOSIS — Z7901 Long term (current) use of anticoagulants: Secondary | ICD-10-CM | POA: Diagnosis not present

## 2018-09-29 DIAGNOSIS — I4891 Unspecified atrial fibrillation: Secondary | ICD-10-CM

## 2018-09-29 DIAGNOSIS — I5033 Acute on chronic diastolic (congestive) heart failure: Secondary | ICD-10-CM | POA: Diagnosis present

## 2018-09-29 DIAGNOSIS — E876 Hypokalemia: Secondary | ICD-10-CM

## 2018-09-29 DIAGNOSIS — I4510 Unspecified right bundle-branch block: Secondary | ICD-10-CM | POA: Diagnosis present

## 2018-09-29 DIAGNOSIS — I050 Rheumatic mitral stenosis: Secondary | ICD-10-CM | POA: Diagnosis present

## 2018-09-29 DIAGNOSIS — E785 Hyperlipidemia, unspecified: Secondary | ICD-10-CM | POA: Diagnosis present

## 2018-09-29 DIAGNOSIS — R002 Palpitations: Secondary | ICD-10-CM | POA: Diagnosis not present

## 2018-09-29 DIAGNOSIS — Z794 Long term (current) use of insulin: Secondary | ICD-10-CM | POA: Diagnosis not present

## 2018-09-29 DIAGNOSIS — I63431 Cerebral infarction due to embolism of right posterior cerebral artery: Secondary | ICD-10-CM | POA: Diagnosis not present

## 2018-09-29 DIAGNOSIS — N390 Urinary tract infection, site not specified: Secondary | ICD-10-CM

## 2018-09-29 DIAGNOSIS — J9621 Acute and chronic respiratory failure with hypoxia: Secondary | ICD-10-CM | POA: Diagnosis present

## 2018-09-29 DIAGNOSIS — I480 Paroxysmal atrial fibrillation: Secondary | ICD-10-CM | POA: Diagnosis present

## 2018-09-29 DIAGNOSIS — I10 Essential (primary) hypertension: Secondary | ICD-10-CM | POA: Diagnosis not present

## 2018-09-29 DIAGNOSIS — E1165 Type 2 diabetes mellitus with hyperglycemia: Secondary | ICD-10-CM | POA: Diagnosis present

## 2018-09-29 DIAGNOSIS — F419 Anxiety disorder, unspecified: Secondary | ICD-10-CM | POA: Diagnosis present

## 2018-09-29 DIAGNOSIS — F329 Major depressive disorder, single episode, unspecified: Secondary | ICD-10-CM | POA: Diagnosis present

## 2018-09-29 DIAGNOSIS — G4700 Insomnia, unspecified: Secondary | ICD-10-CM | POA: Diagnosis present

## 2018-09-29 DIAGNOSIS — I482 Chronic atrial fibrillation, unspecified: Secondary | ICD-10-CM | POA: Diagnosis present

## 2018-09-29 DIAGNOSIS — I959 Hypotension, unspecified: Secondary | ICD-10-CM | POA: Diagnosis present

## 2018-09-29 DIAGNOSIS — Z20828 Contact with and (suspected) exposure to other viral communicable diseases: Secondary | ICD-10-CM | POA: Diagnosis present

## 2018-09-29 DIAGNOSIS — N179 Acute kidney failure, unspecified: Secondary | ICD-10-CM | POA: Diagnosis present

## 2018-09-29 DIAGNOSIS — I130 Hypertensive heart and chronic kidney disease with heart failure and stage 1 through stage 4 chronic kidney disease, or unspecified chronic kidney disease: Secondary | ICD-10-CM | POA: Diagnosis present

## 2018-09-29 DIAGNOSIS — N183 Chronic kidney disease, stage 3 (moderate): Secondary | ICD-10-CM | POA: Diagnosis not present

## 2018-09-29 LAB — GLUCOSE, CAPILLARY
Glucose-Capillary: 127 mg/dL — ABNORMAL HIGH (ref 70–99)
Glucose-Capillary: 222 mg/dL — ABNORMAL HIGH (ref 70–99)
Glucose-Capillary: 187 mg/dL — ABNORMAL HIGH (ref 70–99)
Glucose-Capillary: 253 mg/dL — ABNORMAL HIGH (ref 70–99)

## 2018-09-29 LAB — BASIC METABOLIC PANEL
Anion gap: 11 (ref 5–15)
BUN: 40 mg/dL — ABNORMAL HIGH (ref 8–23)
CO2: 28 mmol/L (ref 22–32)
Calcium: 9.4 mg/dL (ref 8.9–10.3)
Chloride: 97 mmol/L — ABNORMAL LOW (ref 98–111)
Creatinine, Ser: 1.6 mg/dL — ABNORMAL HIGH (ref 0.44–1.00)
GFR calc Af Amer: 34 mL/min — ABNORMAL LOW (ref >60)
GFR calc non Af Amer: 29 mL/min — ABNORMAL LOW (ref >60)
Glucose, Bld: 137 mg/dL — ABNORMAL HIGH (ref 70–99)
Potassium: 3 mmol/L — ABNORMAL LOW (ref 3.5–5.1)
Sodium: 136 mmol/L (ref 135–145)

## 2018-09-29 LAB — CBC
HCT: 38.1 % (ref 36.0–46.0)
Hemoglobin: 11.6 g/dL — ABNORMAL LOW (ref 12.0–15.0)
MCH: 27.1 pg (ref 26.0–34.0)
MCHC: 30.4 g/dL (ref 30.0–36.0)
MCV: 89 fL (ref 80.0–100.0)
Platelets: 236 10*3/uL (ref 150–400)
RBC: 4.28 MIL/uL (ref 3.87–5.11)
RDW: 14 % (ref 11.5–15.5)
WBC: 16.3 10*3/uL — ABNORMAL HIGH (ref 4.0–10.5)
nRBC: 0 % (ref 0.0–0.2)

## 2018-09-29 LAB — ECHOCARDIOGRAM COMPLETE
Height: 64 in
Weight: 3200 oz

## 2018-09-29 LAB — PROCALCITONIN: Procalcitonin: 0.38 ng/mL

## 2018-09-29 MED ORDER — POTASSIUM CHLORIDE CRYS ER 20 MEQ PO TBCR: 30.0000 meq | EXTENDED_RELEASE_TABLET | Freq: Once | ORAL | Status: DC

## 2018-09-29 MED ORDER — PERFLUTREN LIPID MICROSPHERE
1.0000 mL | INTRAVENOUS | Status: AC | PRN
  Filled 2018-09-29: qty 10

## 2018-09-29 MED ORDER — SODIUM CHLORIDE 0.9 % IV BOLUS
500.0000 mL | Freq: Once | INTRAVENOUS | Status: AC
  Administered 2018-09-29: 500 mL via INTRAVENOUS

## 2018-09-29 MED ORDER — INSULIN GLARGINE 100 UNIT/ML ~~LOC~~ SOLN
40.0000 [IU] | Freq: Every day | SUBCUTANEOUS | Status: DC
  Administered 2018-09-29 – 2018-10-05 (×7): 40 [IU] via SUBCUTANEOUS
  Filled 2018-09-29 (×8): qty 0.4

## 2018-09-29 MED ORDER — POTASSIUM CHLORIDE CRYS ER 20 MEQ PO TBCR
40.0000 meq | EXTENDED_RELEASE_TABLET | Freq: Once | ORAL | Status: AC
  Administered 2018-09-29: 10:00:00 40 meq via ORAL
  Filled 2018-09-29: qty 2

## 2018-09-29 MED ORDER — DILTIAZEM HCL 60 MG PO TABS
60.0000 mg | ORAL_TABLET | Freq: Four times a day (QID) | ORAL | Status: DC
  Administered 2018-09-29 (×3): 60 mg via ORAL
  Filled 2018-09-29 (×4): qty 1

## 2018-09-29 NOTE — Progress Notes (Signed)
*  PRELIMINARY RESULTS* Echocardiogram 2D Echocardiogram has been performed with [facility].  Amy Wiggins 09/29/2018, 11:51 AM

## 2018-09-29 NOTE — Plan of Care (Signed)
  Problem: Education: Goal: Knowledge of General Education information will improve Description: Including pain rating scale, medication(s)/side effects and non-pharmacologic comfort measures Outcome: Progressing   Problem: Activity: Goal: Risk for activity intolerance will decrease Outcome: Progressing   

## 2018-09-29 NOTE — Progress Notes (Signed)
Central telemetry called to make nurse aware that patient's heart rate was elevated and sustaining in the 130-140s. Patient was found in bathroom working with physical therapy. Patient did not have oxygen on and O2 sat was 88%. Patient denied any chest pain. Patient was placed in the bedside chair and heart rate currently is 140. Will notify MD and continue to monitor patient.

## 2018-09-29 NOTE — Evaluation (Signed)
Physical Therapy Evaluation Patient Details Name: Amy Wiggins MRN: [id number] DOB: [birth year] Today's Date: 09/29/2018   History of Present Illness  Amy Wiggins  is a 85 y.o. female  with medical history significant of depression, diabetes, hypertension, prior stroke, chronic atrial fibrillation on Eliquis, S/P loop recorder implant 02/17/2016, prior TEE 06/07/15 with PFO who presents to the ED with generalized weakness and hypotension with systolic blood pressure around 80    Clinical Impression  Patient demonstrates slow labored movement for sitting up at bedside requiring use of bed rail, unsteady on feet with near loss of balance during transfer to chair, limited for ambulation mostly due to c/o dizziness/fatigue and unsafe to attempt ambulating out of room.  Patient tolerated sitting up in chair after therapy - RN aware.  Patient will benefit from continued physical therapy in hospital and recommended venue below to increase strength, balance, endurance for safe ADLs and gait.    Follow Up Recommendations SNF;Supervision for mobility/OOB;Supervision - Intermittent    Equipment Recommendations  None recommended by PT    Recommendations for Other Services       Precautions / Restrictions Precautions Precautions: Fall Restrictions Weight Bearing Restrictions: No      Mobility  Bed Mobility Overal bed mobility: Needs Assistance Bed Mobility: Supine to Sit     Supine to sit: Min guard     General bed mobility comments: slow labored movement with use of bed rail to sit up  Transfers Overall transfer level: Needs assistance Equipment used: Rolling walker (2 wheeled) Transfers: Sit to/from [facility] Sit to Stand: Min guard;Min assist Stand pivot transfers: Min guard;Min assist       General transfer comment: labored movement, increased time  Ambulation/Gait Ambulation/Gait assistance: Min assist Gait Distance (Feet): 10 Feet Assistive  device: Rolling walker (2 wheeled) Gait Pattern/deviations: Decreased step length - right;Decreased step length - left;Decreased stride length Gait velocity: decreased   General Gait Details: limited to ambulating to bathroom with slightly unsteady cadence using RW, limited mostly due to c/o dizziness, fatigue  Stairs            Wheelchair Mobility    Modified Rankin (Stroke Patients Only)       Balance Overall balance assessment: Needs assistance Sitting-balance support: Feet supported;No upper extremity supported Sitting balance-Leahy Scale: Fair Sitting balance - Comments: fair/good seated at bedside   Standing balance support: During functional activity;Bilateral upper extremity supported Standing balance-Leahy Scale: Fair Standing balance comment: using RW                             Pertinent Vitals/Pain Pain Assessment: No/denies pain    Home Living Family/patient expects to be discharged to:: Private residence Living Arrangements: Other relatives Available Help at Discharge: Family;Available PRN/intermittently Type of Home: House Home Access: Ramped entrance     Home Layout: Two level;Able to live on main level with bedroom/bathroom Home Equipment: Gilford Rile - 2 wheels;Cane - single point      Prior Function Level of Independence: Independent with assistive device(s)         Comments: Household ambulator with RW     Hand Dominance   Dominant Hand: Right    Extremity/Trunk Assessment   Upper Extremity Assessment Upper Extremity Assessment: Generalized weakness    Lower Extremity Assessment Lower Extremity Assessment: Generalized weakness    Cervical / Trunk Assessment Cervical / Trunk Assessment: Normal  Communication   Communication: No difficulties  Cognition Arousal/Alertness:  Awake/alert Behavior During Therapy: WFL for tasks assessed/performed Overall Cognitive Status: Within Functional Limits for tasks assessed                                         General Comments      Exercises     Assessment/Plan    PT Assessment Patient needs continued PT services  PT Problem List Decreased strength;Decreased activity tolerance;Decreased balance;Decreased mobility       PT Treatment Interventions Gait training;Stair training;Functional mobility training;Therapeutic activities;Therapeutic exercise;Patient/family education    PT Goals (Current goals can be found in the Care Plan section)  Acute Rehab PT Goals Patient Stated Goal: return home with family assist PT Goal Formulation: With patient Time For Goal Achievement: 10/13/18 Potential to Achieve Goals: Good    Frequency Min 3X/week   Barriers to discharge        Co-evaluation               AM-PAC PT "6 Clicks" Mobility  Outcome Measure Help needed turning from your back to your side while in a flat bed without using bedrails?: None Help needed moving from lying on your back to sitting on the side of a flat bed without using bedrails?: A Little Help needed moving to and from a bed to a chair (including a wheelchair)?: A Little Help needed standing up from a chair using your arms (e.g., wheelchair or bedside chair)?: A Little Help needed to walk in hospital room?: A Little Help needed climbing 3-5 steps with a railing? : A Lot 6 Click Score: 18    End of Session   Activity Tolerance: Patient tolerated treatment well;Patient limited by fatigue Patient left: in chair;with chair alarm set;with call bell/phone within reach Nurse Communication: Mobility status PT Visit Diagnosis: Unsteadiness on feet (R26.81);Other abnormalities of gait and mobility (R26.89);Muscle weakness (generalized) (M62.81)    Time: 8144-8185 PT Time Calculation (min) (ACUTE ONLY): 28 min   Charges:   PT Evaluation $PT Eval Moderate Complexity: 1 Mod PT Treatments $Therapeutic Activity: 23-37 mins        10:57 AM, 09/29/18 Lonell Grandchild,  MPT Physical Therapist with [facility] [phone] office [phone] mobile phone

## 2018-09-29 NOTE — Plan of Care (Signed)
  Problem: Acute Rehab PT Goals(only PT should resolve) Goal: Pt Will Go Supine/Side To Sit Outcome: Progressing Flowsheets (Taken 09/29/2018 1102) Pt will go Supine/Side to Sit: with modified independence Goal: Patient Will Transfer Sit To/From Stand Outcome: Progressing Flowsheets (Taken 09/29/2018 1102) Patient will transfer sit to/from stand: with supervision Goal: Pt Will Transfer Bed To Chair/Chair To Bed Outcome: Progressing Flowsheets (Taken 09/29/2018 1102) Pt will Transfer Bed to Chair/Chair to Bed: with supervision Goal: Pt Will Ambulate Outcome: Progressing Flowsheets (Taken 09/29/2018 1102) Pt will Ambulate:  50 feet  with min guard assist  with rolling walker   11:03 AM, 09/29/18 Lonell Grandchild, MPT Physical Therapist with [facility] 336 [phone] office [phone] mobile phone

## 2018-09-29 NOTE — Progress Notes (Signed)
Patient heart rate elevated, hosptialist aware. Continue to monitor patient.

## 2018-09-29 NOTE — TOC Initial Note (Signed)
Transition of Care [facility]) - Initial/Assessment Note    Patient Details  Name: Amy Wiggins MRN: [id number] Date of Birth: [birth year]  Transition of Care [facility]) CM/SW Contact:    Jaquanna Ballentine, Chauncey Reading, RN Phone Number: 09/29/2018, 11:24 AM  Clinical Narrative:     AFib, CHF. From home with grand daughter. Walks with RW. Recommended for SNF. Patient currently getting an ECHO. Call to g.dtr, Krystal Eaton to discuss recommendation. She reports she would be agreeable to SNF, preferably in [place] at [facility].  She plans to discuss with patient later this afternoon since patient has been very sleepy this morning and will call CM with final decision.                Expected Discharge Plan: Skilled Nursing Facility Barriers to Discharge: SNF Pending bed offer   Patient Goals and CMS Choice   CMS Medicare.gov Compare Post Acute Care list provided to:: Other (Comment Required)(GDTR: EMALEE WHEELER) Choice offered to / list presented to : ([place])  Expected Discharge Plan and Services Expected Discharge Plan: [place]    Prior Living Arrangements/Services   Lives with:: Other (Comment)(GDTR) Patient language and need for interpreter reviewed:: No        Need for Family Participation in Patient Care: Yes (Comment) Care giver support system in place?: Yes (comment) Current home services: DME(RW) Criminal Activity/Legal Involvement Pertinent to Current Situation/Hospitalization: No - Comment as needed  Activities of Daily Living Home Assistive Devices/Equipment: Gilford Rile (specify type) ADL Screening (condition at time of admission) Patient's cognitive ability adequate to safely complete daily activities?: Yes Is the patient deaf or have difficulty hearing?: No Does the patient have difficulty seeing, even when wearing glasses/contacts?: No Does the patient have difficulty concentrating, remembering, or making decisions?: No Patient able to express need for assistance  with ADLs?: Yes Does the patient have difficulty dressing or bathing?: No Independently performs ADLs?: Yes (appropriate for developmental age) Does the patient have difficulty walking or climbing stairs?: No Weakness of Legs: Both Weakness of Arms/Hands: Both     Admission diagnosis:  Atrial fibrillation with rapid ventricular response ([place]) [I48.91] Hypotension, unspecified hypotension type [I95.9] Acute on chronic congestive heart failure, unspecified heart failure type [facility]) [I50.9] Patient Active Problem List   Diagnosis Date Noted  . Acute diastolic CHF (congestive heart failure) ([place]) 09/29/2018  . CKD (chronic kidney disease) stage 3, GFR 30-59 ml/min (HCC) 09/29/2018  . Atrial fibrillation with rapid ventricular response ([place])   . Hypotension 09/28/2018  . Chronic a-fib with RVR 09/28/2018  . Cerebellar infarct ([place])   . Stroke ([place]) 02/22/2016  . Ischemic stroke ([place]) 02/22/2016  . HFpEF/Chronic diastolic congestive heart failure ([place]) 02/22/2016  . AKI (acute kidney injury) ([place]) 02/22/2016  . Diabetes mellitus with complication ([place])   . Shortness of breath 01/14/2016  . Wheezing 01/14/2016  . Paroxysmal atrial fibrillation ([place]) 11/14/2015  . Chronic anticoagulation 11/14/2015  . Cerebrovascular accident (CVA) due to embolism of right posterior cerebral artery ([place]) 08/08/2015  . Essential hypertension 08/08/2015  . HLD (hyperlipidemia) 08/08/2015  . Type 2 diabetes mellitus with circulatory disorder ([place]) 08/08/2015  . PFO (patent foramen ovale)   . ARF (acute renal failure) ([place]) 06/04/2015  . Cerebral infarction due to unspecified mechanism   . Acute CVA (cerebrovascular accident) ([place]) 06/02/2015  . Hypertension 06/02/2015  . Hyperlipidemia 06/02/2015  . CKD (chronic kidney disease) stage 4, GFR 15-29 ml/min (HCC) 06/02/2015  . DM type 2 (diabetes mellitus, type 2) ([place]) 06/02/2015  .  Leukocytosis 06/02/2015  . Muscle weakness (generalized) 09/16/2012  .  Pain in joint, shoulder region 09/16/2012  . Closed displaced fracture of lateral end of clavicle 08/18/2012   PCP:  Asencion Noble, MD Pharmacy:   [facility], [place] [address] [place] [address] Phone: [phone] Fax: [phone]

## 2018-09-29 NOTE — Progress Notes (Addendum)
PROGRESS NOTE  Amy Wiggins CBU:[id number] DOB: 02/18/1932 DOA: 09/28/2018 PCP: Asencion Noble, MD  Brief History:  85 year old female with history of diabetes mellitus type 2, hypertension, hyperlipidemia, paroxysmal atrial fibrillation, stroke, PFO presenting with 2 to 3-day history of shortness of breath and generalized weakness.  The patient states that she has had the above symptoms since she returned from the grocery store on 09/26/2018.  She states that the symptoms have progressed.  She complain of some subjective fevers and chills but denied any headache, coughing, chest pain, nausea, vomiting, diarrhea, domino pain, dysuria.  Because of continued shortness of breath, she presented for further evaluation.  Patient states that she frequently misses her medications at home, up to 3 days per week.  In the ED, the patient was noted to have heart rate in the 140s.  EKG showed atrial fibrillation with RVR.  She was started on diltiazem drip which has been since converted to oral diltiazem.  Chest x-ray showed pulmonary vascular congestion.  BNP was 1053.  She was on IV furosemide.  Cardiology was consulted to assist with management.  Assessment/Plan: Acute respiratory failure with hypoxia -Secondary to CHF -Presently stable on 2 L nasal cannula -Wean oxygen for saturation 92% -Personally reviewed chest x-ray--increased interstitial markings  Acute diastolic CHF -05/16/2246 echo EF 55-60%, grade 1 DD, no WMA -Continue IV furosemide -Accurate I's and O's -Patient stated that she weighed 202 pounds at home -Repeat echocardiogram  Atrial fibrillation with RVR -CHADSVASc = 7 -continue apixaban -increase diltiazem to 60 mg po q 6hrs -d/c metoprolol to allow BP margin for titration -TSH 0.828 -she continues to go in and out of afib with HR up to 130s  Leukocytosis -Suspect stress demargination -UA without significant pyuria -Discontinue antibiotics monitor clinically -Blood  cultures x2 sets--patient had been started on ceftriaxone prior to blood cultures -CT chest -Chest x-ray without consolidation -d/c ceftriaxone and monitor clinically  CKD stage III -Baseline creatinine 1.4-1.7 -Monitor with diuresis -A.m. BMP  Uncontrolled diabetes mellitus type 2 with hyperglycemia -Continue Lantus -Continue NovoLog sliding scale -09/28/2018 hemoglobin A1c 9.3  Depression/anxiety -Continue Celexa   Hyperlipidemia -Continue statin  Hypokalemia -replete -check mag         Disposition Plan:   Home in 2-3 days  Family Communication: No  Family at bedside  Consultants:  cardiology  Code Status:  FULL  DVT Prophylaxis:  apixaban   Procedures: As Listed in Progress Note Above  Antibiotics: Ceftriaxone 8/3     Subjective: Patient states that she is a little short of breath but better than yesterday.  She denies any headache, chest pain, abdominal pain, dysuria, headache.  There is no fevers or chills.  She denies any vomiting, diarrhea  Objective: Vitals:   09/28/18 2108 09/28/18 2345 09/29/18 0622 09/29/18 0842  BP: (!) 145/89 (!) 118/94 99/65 97/69   Pulse: 76  (!) 103 70  Resp: 18  18 18   Temp: 97.9 F (36.6 C)  (!) 97.5 F (36.4 C) 98.5 F (36.9 C)  TempSrc: Oral  Oral Oral  SpO2: 95%  96% 95%  Weight:      Height:        Intake/Output Summary (Last 24 hours) at 09/29/2018 0905 Last data filed at 09/29/2018 0500 Gross per 24 hour  Intake 343 ml  Output 0 ml  Net 343 ml   Weight change:  Exam:   General:  Pt is alert, follows commands appropriately, not in  acute distress  HEENT: No icterus, No thrush, No neck mass, [place]/AT  Cardiovascular: IRRR, S1/S2, no rubs, no gallops  Respiratory: Bibasilar crackles but no wheeze  Abdomen: Soft/+BS, non tender, non distended, no guarding  Extremities: No edema, No lymphangitis, No petechiae, No rashes, no synovitis   Data Reviewed: I have personally reviewed following labs and  imaging studies Basic Metabolic Panel: Recent Labs  Lab 09/28/18 1256 09/28/18 1505 09/29/18 0633  NA 133*  --  136  K 4.0  --  3.0*  CL 96*  --  97*  CO2 26  --  28  GLUCOSE 322*  --  137*  BUN 40*  --  40*  CREATININE 1.89*  --  1.60*  CALCIUM 9.2  --  9.4  MG  --  2.0  --    Liver Function Tests: No results for input(s): AST, ALT, ALKPHOS, BILITOT, PROT, ALBUMIN in the last 168 hours. No results for input(s): LIPASE, AMYLASE in the last 168 hours. No results for input(s): AMMONIA in the last 168 hours. Coagulation Profile: No results for input(s): INR, PROTIME in the last 168 hours. CBC: Recent Labs  Lab 09/28/18 1256 09/29/18 0633  WBC 17.5* 16.3*  NEUTROABS 15.6*  --   HGB 12.0 11.6*  HCT 38.7 38.1  MCV 88.2 89.0  PLT 237 236   Cardiac Enzymes: No results for input(s): CKTOTAL, CKMB, CKMBINDEX, TROPONINI in the last 168 hours. BNP: Invalid input(s): POCBNP CBG: Recent Labs  Lab 09/28/18 1819 09/28/18 2110 09/29/18 0807  GLUCAP 327* 273* 127*   HbA1C: Recent Labs    09/28/18 1256  HGBA1C 9.3*   Urine analysis:    Component Value Date/Time   COLORURINE YELLOW 09/28/2018 1216   APPEARANCEUR HAZY (A) 09/28/2018 1216   LABSPEC 1.007 09/28/2018 1216   PHURINE 5.0 09/28/2018 1216   GLUCOSEU >=500 (A) 09/28/2018 1216   HGBUR NEGATIVE 09/28/2018 1216   BILIRUBINUR NEGATIVE 09/28/2018 1216   KETONESUR NEGATIVE 09/28/2018 1216   PROTEINUR NEGATIVE 09/28/2018 1216   UROBILINOGEN 0.2 12/12/2010 1102   NITRITE NEGATIVE 09/28/2018 1216   LEUKOCYTESUR MODERATE (A) 09/28/2018 1216   Sepsis Labs: @LABRCNTIP (procalcitonin:4,lacticidven:4) ) Recent Results (from the past 240 hour(s))  SARS CORONAVIRUS 2 Nasal Swab Aptima Multi Swab     Status: None   Collection Time: 09/28/18 12:17 PM   Specimen: Aptima Multi Swab; Nasal Swab  Result Value Ref Range Status   SARS Coronavirus 2 NEGATIVE NEGATIVE Final    Comment: (NOTE) SARS-CoV-2 target nucleic acids  are NOT DETECTED. The SARS-CoV-2 RNA is generally detectable in upper and lower respiratory specimens during the acute phase of infection. Negative results do not preclude SARS-CoV-2 infection, do not rule out co-infections with other pathogens, and should not be used as the sole basis for treatment or other patient management decisions. Negative results must be combined with clinical observations, patient history, and epidemiological information. The expected result is Negative. Fact Sheet for Patients: SugarRoll.be Fact Sheet for Healthcare Providers: https://www.woods-mathews.com/ This test is not yet approved or cleared by the [place] FDA and  has been authorized for detection and/or diagnosis of SARS-CoV-2 by FDA under an Emergency Use Authorization (EUA). This EUA will remain  in effect (meaning this test can be used) for the duration of the COVID-19 declaration under Section 56 4(b)(1) of the Act, 21 U.S.C. section 360bbb-3(b)(1), unless the authorization is terminated or revoked sooner. Performed at [facility], [place] [address] [place], [place] [address]   Culture, blood (  Routine X 2) w Reflex to ID Panel     Status: None (Preliminary result)   Collection Time: 09/29/18  6:53 AM   Specimen: BLOOD RIGHT HAND  Result Value Ref Range Status   Specimen Description BLOOD RIGHT HAND  Final   Special Requests   Final    BOTTLES DRAWN AEROBIC AND ANAEROBIC Blood Culture adequate volume Performed at [facility], [address], [place], [place] [address]    Culture PENDING  Incomplete   Report Status PENDING  Incomplete     Scheduled Meds: . apixaban  2.5 mg Oral BID  . atorvastatin  40 mg Oral QPM  . citalopram  20 mg Oral Daily  . diltiazem  30 mg Oral Q6H  . furosemide  20 mg Intravenous BID  . insulin aspart  0-5 Units Subcutaneous QHS  . insulin aspart  0-9 Units Subcutaneous TID WC  . insulin glargine  44 Units  Subcutaneous Daily  . magnesium oxide  400 mg Oral QPM  . metoprolol tartrate  25 mg Oral BID  . multivitamin with minerals  1 tablet Oral QHS  . multivitamin-lutein  1 capsule Oral BID  . pantoprazole  40 mg Oral Daily  . potassium chloride  40 mEq Oral Once  . sodium chloride flush  3 mL Intravenous Q12H  . traZODone  100 mg Oral QHS  . venlafaxine XR  75 mg Oral Q breakfast   Continuous Infusions: . sodium chloride    . cefTRIAXone (ROCEPHIN)  IV Stopped (09/28/18 1950)    Procedures/Studies: Dg Chest Port 1 View  Result Date: 09/28/2018 CLINICAL DATA:  Generalized weakness today. Low blood pressure. History of diabetes and hypertension. EXAM: PORTABLE CHEST 1 VIEW COMPARISON:  Radiographs 02/10/2017 and 01/14/2016. FINDINGS: 1228 hours. Stable cardiomegaly with mild vascular congestion. There is no overt pulmonary edema, confluent airspace opacity, pleural effusion or pneumothorax. Loop recorder overlies the lower left chest. No acute osseous findings are evident. Telemetry leads overlie the chest. IMPRESSION: Cardiomegaly with mild vascular congestion.  No edema. Electronically Signed   By: Richardean Sale M.D.   On: 09/28/2018 12:41    Orson Eva, DO  [facility] Pager [phone]  If 7PM-7AM, please contact night-coverage [email or web address] Password [email or web address] 09/29/2018, 9:05 AM   LOS: 0 days

## 2018-09-29 NOTE — Consult Note (Addendum)
Cardiology Consult    Patient ID: Amy Wiggins; [id number]; 1932-02-25   Admit date: 09/28/2018 Date of Consult: 09/29/2018  Primary Care Provider: Asencion Noble, MD Primary Cardiologist: Dr. Caryl Comes  Patient Profile    Amy Wiggins is a 85 y.o. female with past medical history of paroxysmal atrial fibrillation (on Eliquis), HTN, HLD, Type 2 DM and prior CVA's who is being seen today for the evaluation of atrial fibrillation with RVR at the request of Dr. [name].   History of Present Illness    Ms. Munnerlyn was last examined by Dr. Caryl Comes in 05/2017 and denied any recent palpitations at that time.  She was having intermittent orthostatic hypotension and Losartan was reduced to 50 mg daily. Most recent ILR interrogations from the past few months have shown no new atrial fibrillation episodes.   She presented to [facility] ED on 09/28/2018 for evaluation of generalized weakness and was found to be hypotensive with BP at 80/60 while in Triage. She reported associated dyspnea but denied any chest pain or palpitations. In talking with the patient, she reports having an episode of weakness and nausea occurring two weeks ago while shopping at the grocery store. Unsure if she was just overheated or perhaps dehydrated. Does not recall any chest pain or palpitations. For the past two weeks, reports overall fatigue and weakness. Unaware of her elevated HR. No recent orthopnea, PND, or lower extremity edema. Her granddaughter helps with her medications but she says she sometimes forgets to take them.  Initial labs showed WBC 17.5, Hgb 12.0, platelets 237, Na+ 133, K+ 4.0, and creatinine 1.89 (Baseline 1.4 - 1.5). BNP 1053. COVID negative. Initial HS Troponin 30 with repeat at 26. TSH 0.828. Mg 2.0. CXR shows cardiomegaly with mild vascular congestion. EKG showed atrial fibrillation with RVR, HR 145, with RBBB, and LAFB.   She was started on IV Cardizem while in the ED but this has since been discontinued  and transitioned to PO short-acting Cardizem 30mg  Q6H while continuing on PTA Eliquis and Lopressor 25mg  BID. Has also been started on Rocephin given her elevated WBC count and possible UTI (culture pending). Initially received IVF bolus due to hypotension but has been started on IV Lasix given her CXR and elevated BNP. Received 40mg  of Lasix yesterday afternoon and scheduled to receive IV Lasix 20mg  BID today.   By review of telemetry she has mostly been in atrial fibrillation with rates in the low-100's to 110's. Did convert back to NSR intermittently overnight for 20 - 30 minute time frames and post-conversion pauses noted, up to 1.7 seconds.    Past Medical History:  Diagnosis Date   Chronic anticoagulation    Depression    Diabetes mellitus    Hyperlipidemia    Hypertension    PAF (paroxysmal atrial fibrillation) (HCC)    PFO (patent foramen ovale)    Stroke (HCC)    TIA's x 2    Past Surgical History:  Procedure Laterality Date   COLONOSCOPY     COLONOSCOPY N/A 09/09/2014   Procedure: COLONOSCOPY;  Surgeon: Rogene Houston, MD;  Location[facility];  Service: Endoscopy;  Laterality: N/A;  1010 - moved to 7:30 - Ann to notify   EP IMPLANTABLE DEVICE N/A 06/07/2015   Procedure: Loop Recorder Insertion;  Surgeon: Deboraha Sprang, MD;  Location: [place] [facility];  Service: Cardiovascular;  Laterality: N/A;   TEE WITHOUT CARDIOVERSION N/A 06/07/2015   Procedure: TRANSESOPHAGEAL ECHOCARDIOGRAM (TEE);  Surgeon: Arnette Norris  Deboraha Sprang, MD;  Location: [place] [facility];  Service: Cardiovascular;  Laterality: N/A;     Home Medications:  Prior to Admission medications   Medication Sig Start Date End Date Taking? Authorizing Provider  acetaminophen (TYLENOL) 500 MG tablet Take 1,000 mg by mouth at bedtime. Take 2 tablets (1000 mg) by mouth daily at bedtime, may also take 2 tablets during the day as needed for pain   Yes [provider]  atorvastatin (LIPITOR) 20 MG tablet Take 2  tablets (40 mg total) by mouth every evening. 4pm Patient taking differently: Take 20 mg by mouth at bedtime.  02/24/16  Yes Caren Griffins, MD  buPROPion (WELLBUTRIN XL) 300 MG 24 hr tablet Take 300 mg by mouth daily.   Yes [provider]  chlorthalidone (HYGROTON) 25 MG tablet Take 25 mg by mouth daily. For high blood pressure/fluid 01/03/13  Yes [provider]  citalopram (CELEXA) 20 MG tablet Take 20 mg by mouth daily.  04/01/17  Yes [provider]  ELIQUIS 2.5 MG TABS tablet Take 2.5 mg by mouth 2 (two) times daily.  04/01/17  Yes [provider]  Ginkgo Biloba 120 MG CAPS Take 120 mg by mouth daily.   Yes [provider]  LANTUS SOLOSTAR 100 UNIT/ML Solostar Pen Inject 44 Units into the skin daily.  02/27/17  Yes [provider]  losartan (COZAAR) 100 MG tablet Take 100 mg by mouth daily.    Yes [provider]  Magnesium Oxide 250 MG TABS Take 500 mg by mouth every evening. 500mg  tablets on hand   Yes [provider]  metoprolol tartrate (LOPRESSOR) 25 MG tablet Take 1 tablet (25 mg total) by mouth 2 (two) times daily. 01/16/16  Yes Charlynne Cousins, MD  Multiple Vitamin (MULTIVITAMIN WITH MINERALS) TABS tablet Take 1 tablet by mouth at bedtime. For supplement   Yes [provider]  Multiple Vitamins-Minerals (PRESERVISION AREDS 2) CAPS Take 1 capsule by mouth 2 (two) times daily.   Yes [provider]  pantoprazole (PROTONIX) 40 MG tablet Take 40 mg by mouth daily. For acid reflux/gerd   Yes [provider]  traZODone (DESYREL) 100 MG tablet Take 150 mg by mouth at bedtime.    Yes [provider]    Inpatient Medications: Scheduled Meds:  apixaban  2.5 mg Oral BID   atorvastatin  40 mg Oral QPM   citalopram  20 mg Oral Daily   diltiazem  30 mg Oral Q6H   furosemide  20 mg Intravenous BID   insulin aspart  0-5 Units Subcutaneous QHS   insulin aspart  0-9 Units  Subcutaneous TID WC   insulin glargine  44 Units Subcutaneous Daily   magnesium oxide  400 mg Oral QPM   metoprolol tartrate  25 mg Oral BID   multivitamin with minerals  1 tablet Oral QHS   multivitamin-lutein  1 capsule Oral BID   pantoprazole  40 mg Oral Daily   pioglitazone  15 mg Oral QPM   sodium chloride flush  3 mL Intravenous Q12H   traZODone  100 mg Oral QHS   venlafaxine XR  75 mg Oral Q breakfast   Continuous Infusions:  sodium chloride     cefTRIAXone (ROCEPHIN)  IV Stopped (09/28/18 1950)   PRN Meds: sodium chloride, acetaminophen **OR** acetaminophen, albuterol, ondansetron **OR** ondansetron (ZOFRAN) IV, polyethylene glycol, sodium chloride flush  Allergies:   No Known Allergies  Social History:   Social History   Socioeconomic History  Marital status: Widowed    Spouse name: Not on file   Number of children: Not on file   Years of education: Not on file   Highest education level: Not on file  Occupational History   Not on file  Social Needs   Financial resource strain: Not on file   Food insecurity    Worry: Not on file    Inability: Not on file   Transportation needs    Medical: Not on file    Non-medical: Not on file  Tobacco Use   Smoking status: Never Smoker   Smokeless tobacco: Never Used  Substance and Sexual Activity   Alcohol use: No   Drug use: No   Sexual activity: Never  Lifestyle   Physical activity    Days per week: Not on file    Minutes per session: Not on file   Stress: Not on file  Relationships   Social connections    Talks on phone: Not on file    Gets together: Not on file    Attends religious service: Not on file    Active member of club or organization: Not on file    Attends meetings of clubs or organizations: Not on file    Relationship status: Not on file   Intimate partner violence    Fear of current or ex partner: Not on file    Emotionally abused: Not on file    Physically abused:  Not on file    Forced sexual activity: Not on file  Other Topics Concern   Not on file  Social History Narrative   Not on file     Family History:    Family History  Problem Relation Age of Onset   Stroke Father    Dementia Sister       Review of Systems    General:  No chills, fever, night sweats or weight changes. Positive for fatigue.  Cardiovascular:  No chest pain, dyspnea on exertion, edema, orthopnea, palpitations, paroxysmal nocturnal dyspnea. Dermatological: No rash, lesions/masses Respiratory: No cough, dyspnea Urologic: No hematuria, dysuria Abdominal:   No nausea, vomiting, diarrhea, bright red blood per rectum, melena, or hematemesis Neurologic:  No visual changes or changes in mental status. Positive for weakness.   All other systems reviewed and are otherwise negative except as noted above.  Physical Exam/Data    Vitals:   09/28/18 1739 09/28/18 2108 09/28/18 2345 09/29/18 0622  BP: 97/65 (!) 145/89 (!) 118/94 99/65  Pulse: (!) 101 76  (!) 103  Resp: 20 18  18   Temp: 98.4 F (36.9 C) 97.9 F (36.6 C)  (!) 97.5 F (36.4 C)  TempSrc: Oral Oral  Oral  SpO2: 94% 95%  96%  Weight:      Height:        Intake/Output Summary (Last 24 hours) at 09/29/2018 0751 Last data filed at 09/29/2018 0500 Gross per 24 hour  Intake 343 ml  Output 0 ml  Net 343 ml   Filed Weights   09/28/18 1202 09/28/18 1210  Weight: 90.7 kg 90.7 kg   Body mass index is 34.33 kg/m.   General: Pleasant female appearing in NAD Psych: Normal affect. Neuro: Alert and oriented X 3. Moves all extremities spontaneously. HEENT: Normal  Neck: Supple without bruits or JVD. Lungs:  Resp regular and unlabored, decreased along bases bilaterally. Heart: Irregularly irregular. No s3, s4, 2/6 holosystolic murmur along Apex.  Abdomen: Soft, non-tender, non-distended, BS + x 4.  Extremities: No clubbing  or cyanosis. No lower extremity edema. DP/PT/Radials 2+ and equal  bilaterally.   EKG:  The EKG was personally reviewed and demonstrates:  Atrial fibrillation with RVR, HR 145, with RBBB, and LAFB.    Telemetry:  Telemetry was personally reviewed and demonstrates: As above in HPI.    Labs/Studies     Relevant CV Studies:  Echocardiogram: 01/2016 Study Conclusions  - Left ventricle: The cavity size was normal. Wall thickness was   increased in a pattern of moderate LVH. Systolic function was   normal. The estimated ejection fraction was in the range of 55%   to 60%. Wall motion was normal; there were no regional wall   motion abnormalities. Doppler parameters are consistent with   abnormal left ventricular relaxation (grade 1 diastolic   dysfunction). - Mitral valve: Moderately to severely calcified annulus. - Left atrium: The atrium was severely dilated. - Right ventricle: The cavity size was mildly dilated.  Impressions:  - No cardiac source of embolism was identified, but cannot be ruled   out on the basis of this examination.  Recommendations:  Consider transesophageal echocardiography if clinically indicated.  Laboratory Data:  Chemistry Recent Labs  Lab 09/28/18 1256 09/29/18 0633  NA 133* 136  K 4.0 3.0*  CL 96* 97*  CO2 26 28  GLUCOSE 322* 137*  BUN 40* 40*  CREATININE 1.89* 1.60*  CALCIUM 9.2 9.4  GFRNONAA 24* 29*  GFRAA 28* 34*  ANIONGAP 11 11    No results for input(s): PROT, ALBUMIN, AST, ALT, ALKPHOS, BILITOT in the last 168 hours. Hematology Recent Labs  Lab 09/28/18 1256 09/29/18 0633  WBC 17.5* 16.3*  RBC 4.39 4.28  HGB 12.0 11.6*  HCT 38.7 38.1  MCV 88.2 89.0  MCH 27.3 27.1  MCHC 31.0 30.4  RDW 14.0 14.0  PLT 237 236   Cardiac EnzymesNo results for input(s): TROPONINI in the last 168 hours. No results for input(s): TROPIPOC in the last 168 hours.  BNP Recent Labs  Lab 09/28/18 1256  BNP 1,053.0*    DDimer No results for input(s): DDIMER in the last 168 hours.  Radiology/Studies:  Dg  Chest Port 1 View  Result Date: 09/28/2018 CLINICAL DATA:  Generalized weakness today. Low blood pressure. History of diabetes and hypertension. EXAM: PORTABLE CHEST 1 VIEW COMPARISON:  Radiographs 02/10/2017 and 01/14/2016. FINDINGS: 1228 hours. Stable cardiomegaly with mild vascular congestion. There is no overt pulmonary edema, confluent airspace opacity, pleural effusion or pneumothorax. Loop recorder overlies the lower left chest. No acute osseous findings are evident. Telemetry leads overlie the chest. IMPRESSION: Cardiomegaly with mild vascular congestion.  No edema. Electronically Signed   By: Richardean Sale M.D.   On: 09/28/2018 12:41     Assessment & Plan    1. Paroxysmal Atrial Fibrillation - she has a known history of PAF which has been documented by her ILR following prior cryptogenic CVA. Most recent ILR interrogations from the past few months have shown no new atrial fibrillation episodes.  - presented with generalized weakness and found to be hypotensive, in atrial fibrillation with RVR, and to have a possible UTI. Labs showed WBC 17.5, Hgb 12.0, platelets 237, Na+ 133, K+ 4.0, and creatinine 1.89 (Baseline 1.4 - 1.5). BNP 1053. COVID negative. Initial HS Troponin 30 with repeat at 26. TSH 0.828. Mg 2.0. - she initially required IV Cardizem and this has been weaned to PO medications. Intermittent episodes of NSR overnight and post-conversion pauses less than 2 seconds. Now on short-acting Cardizem 30mg   Q6H and PTA  Lopressor 25mg  BID with HR in the low-100's. Will recheck echocardiogram to assess LV function and wall motion. She does report missing doses of her medications and compliance was strongly encouraged, especially in the setting of needing anticoagulation. Remains on Eliquis 2.5mg  BID (reduced dosing given renal function and age). Would consider transitioning Lopressor to Toprol-XL prior to discharge for improved compliance. Hopefully can further titrate this and wean Cardizem to  avoid multiple AV nodal blocking medications given her issues with compliance. She is at high-risk for recurrence given her severely dilated LA by prior echo.   2. Acute CHF Exacerbation (Echo pending to assess LV Function) - received IVF on admission due to hypotension but BNP was elevated to 1053 and CXR consistent with CHF. She has since received IV Lasix 40mg  and been started on IV Lasix 20mg  BID. Oxygen saturations were in the high-80's to low-90's while talking. Getting ready to ambulate with PT and asked to follow HR and oxygen saturations with ambulation. Will obtain an updated echocardiogram to assess LV function and wall motion.  - continue with low-dose IV Lasix today. Reassess tomorrow and repeat BMET given her CKD. She is hypokalemic at 3.0 and will order supplementation.    3. HTN - BP has been variable at 90/56 - 145/94 within the past 24 hours.  - continue to hold PTA Losartan to allow for further titration of her AV nodal blocking agents.   4. HLD - no recent FLP on file. Has been continued on PTA Atorvastatin 40mg  daily.   5. Possible UTI - WBC elevated to 17.5 on admission. UA concerning for UTI and culture pending at this time. She has been started on Rocephin.    6. Stage 3 CKD - baseline creatinine 1.4 - 1.5. Elevated to 1.89 on admission and improved to 1.60 today.    For questions or updates, please contact [place] Please consult [email or web address] for contact info under Cardiology/STEMI.  Signed, Erma Heritage, PA-C 09/29/2018, 7:51 AM Pager: [phone]  The patient was seen and examined, and I agree with the history, physical exam, assessment and plan as documented above, with modifications as noted below. I have also personally reviewed all relevant documentation, old records, labs, and both radiographic and cardiovascular studies. I have also independently interpreted old and new ECG's.  Briefly, this is an 85 year old woman who last saw Dr. Caryl Comes in  April 2019.  She has a history of paroxysmal atrial fibrillation and cryptogenic stroke.  She has an implantable loop recorder.  She presented to the hospital yesterday with complaints of generalized weakness and was found to be hypotensive and in rapid atrial fibrillation.  She was initially given IV fluids by the ED physician and IV diltiazem.  Labs reviewed above with elevated creatinine of 1.89 and elevated BNP of 1053.  High-sensitivity troponins were normal.  TSH 0.828.  Chest x-ray showed cardiomegaly and mild vascular congestion.  She is now on oral Cardizem 30 mg every 6 hours, renally-dosed Eliquis, and metoprolol tartrate 25 mg twice daily.  She is presumptively being treated for UTI with Rocephin.  She is now on IV Lasix given CHF.  During my exam there is no lower extremity edema.  No frank Rales on exam.  She is tachycardic and irregular.  She is sitting upright in a chair and in no acute distress.  Heart rates have been in the low 100-110 bpm range.  During my exam she was in the 140 bpm range.  She  did briefly convert to sinus rhythm overnight but is back in atrial fibrillation at present.  Heart failure is presumably diastolic in etiology given rapid atrial fibrillation and normal LV function in 2017.  We will obtain a follow-up echocardiogram to assess for interval changes in cardiac structure and function.   Kate Sable, MD, [facility]  09/29/2018 9:38 AM

## 2018-09-30 DIAGNOSIS — I509 Heart failure, unspecified: Secondary | ICD-10-CM

## 2018-09-30 DIAGNOSIS — E785 Hyperlipidemia, unspecified: Secondary | ICD-10-CM

## 2018-09-30 LAB — BASIC METABOLIC PANEL
Anion gap: 11 (ref 5–15)
BUN: 46 mg/dL — ABNORMAL HIGH (ref 8–23)
CO2: 27 mmol/L (ref 22–32)
Calcium: 9.1 mg/dL (ref 8.9–10.3)
Chloride: 101 mmol/L (ref 98–111)
Creatinine, Ser: 1.99 mg/dL — ABNORMAL HIGH (ref 0.44–1.00)
GFR calc Af Amer: 26 mL/min — ABNORMAL LOW (ref >60)
GFR calc non Af Amer: 22 mL/min — ABNORMAL LOW (ref >60)
Glucose, Bld: 173 mg/dL — ABNORMAL HIGH (ref 70–99)
Potassium: 3.3 mmol/L — ABNORMAL LOW (ref 3.5–5.1)
Sodium: 139 mmol/L (ref 135–145)

## 2018-09-30 LAB — URINE CULTURE: Culture: >=100000 — AB

## 2018-09-30 LAB — GLUCOSE, CAPILLARY
Glucose-Capillary: 144 mg/dL — ABNORMAL HIGH (ref 70–99)
Glucose-Capillary: 283 mg/dL — ABNORMAL HIGH (ref 70–99)
Glucose-Capillary: 176 mg/dL — ABNORMAL HIGH (ref 70–99)

## 2018-09-30 LAB — CBC
HCT: 36.2 % (ref 36.0–46.0)
Hemoglobin: 10.6 g/dL — ABNORMAL LOW (ref 12.0–15.0)
MCH: 26.8 pg (ref 26.0–34.0)
MCHC: 29.3 g/dL — ABNORMAL LOW (ref 30.0–36.0)
MCV: 91.4 fL (ref 80.0–100.0)
Platelets: 229 10*3/uL (ref 150–400)
RBC: 3.96 MIL/uL (ref 3.87–5.11)
RDW: 14 % (ref 11.5–15.5)
WBC: 11 10*3/uL — ABNORMAL HIGH (ref 4.0–10.5)
nRBC: 0 % (ref 0.0–0.2)

## 2018-09-30 LAB — MAGNESIUM: Magnesium: 2.3 mg/dL (ref 1.7–2.4)

## 2018-09-30 MED ORDER — POTASSIUM CHLORIDE CRYS ER 20 MEQ PO TBCR
20.0000 meq | EXTENDED_RELEASE_TABLET | Freq: Once | ORAL | Status: AC
  Administered 2018-10-01: 20 meq via ORAL
  Filled 2018-09-30: qty 1

## 2018-09-30 MED ORDER — DILTIAZEM HCL ER COATED BEADS 240 MG PO CP24
240.0000 mg | ORAL_CAPSULE | Freq: Every day | ORAL | Status: DC
  Administered 2018-09-30 – 2018-10-02 (×3): 240 mg via ORAL
  Filled 2018-09-30 (×3): qty 1

## 2018-09-30 MED ORDER — POTASSIUM CHLORIDE CRYS ER 20 MEQ PO TBCR
40.0000 meq | EXTENDED_RELEASE_TABLET | Freq: Once | ORAL | Status: AC
  Administered 2018-09-30: 09:00:00 40 meq via ORAL
  Filled 2018-09-30: qty 2

## 2018-09-30 MED ORDER — FUROSEMIDE 20 MG PO TABS: 20.0000 mg | ORAL_TABLET | Freq: Every day | ORAL | Status: DC

## 2018-09-30 MED ORDER — INSULIN ASPART 100 UNIT/ML ~~LOC~~ SOLN
3.0000 [IU] | Freq: Three times a day (TID) | SUBCUTANEOUS | Status: DC
  Administered 2018-09-30 – 2018-10-04 (×8): 3 [IU] via SUBCUTANEOUS

## 2018-09-30 MED ORDER — SODIUM CHLORIDE 0.9 % IV SOLN
INTRAVENOUS | Status: DC
  Administered 2018-09-30 – 2018-10-01 (×2): via INTRAVENOUS

## 2018-09-30 MED ORDER — METOPROLOL TARTRATE 5 MG/5ML IV SOLN
2.5000 mg | Freq: Once | INTRAVENOUS | Status: AC
  Administered 2018-09-30: 2.5 mg via INTRAVENOUS
  Filled 2018-09-30: qty 5

## 2018-09-30 MED ORDER — SODIUM CHLORIDE 0.9 % IV BOLUS
500.0000 mL | Freq: Once | INTRAVENOUS | Status: AC
  Administered 2018-09-30: 500 mL via INTRAVENOUS

## 2018-09-30 MED ORDER — MAGNESIUM SULFATE 2 GM/50ML IV SOLN
2.0000 g | Freq: Once | INTRAVENOUS | Status: AC
  Administered 2018-09-30: 2 g via INTRAVENOUS
  Filled 2018-09-30: qty 50

## 2018-09-30 MED ORDER — FUROSEMIDE 20 MG PO TABS
20.0000 mg | ORAL_TABLET | Freq: Every day | ORAL | Status: DC
  Administered 2018-09-30 – 2018-10-02 (×3): 20 mg via ORAL
  Filled 2018-09-30 (×3): qty 1

## 2018-09-30 NOTE — TOC Progression Note (Addendum)
Transition of Care [facility]) - Progression Note    Patient Details  Name: Amy Wiggins MRN: [id number] Date of Birth: Jul 10, 1931  [facility] CM/SW Contact  Boneta Lucks, RN Phone Number: 09/30/2018, 12:55 PM  Clinical Narrative:   CM spoke with Raquel Sarna- granddaughter. Patient lives with her and she wants her home not in SNF.  They have used [facility] in the past.  Referral given to Judson Roch at [facility] for HHPT/RN  Addendum: Judson Roch from [facility] called to say they do not have staff to take this referral.  Romualdo Bolk with [facility] will take the patient for PT/RN  Expected Discharge Plan: [place] Barriers to Discharge: Continued Medical Work up  Expected Discharge Plan and Services Expected Discharge Plan: [place] Choice: [facility] [place]: RN, PT   Readmission Risk Interventions No flowsheet data found.

## 2018-09-30 NOTE — Progress Notes (Addendum)
Progress Note  Patient Name: Amy Wiggins Date of Encounter: 09/30/2018  Primary Cardiologist: Virl Axe, MD   Subjective   She said her breathing has improved and denies palpitations and shortness of breath today.  She told me she has been urinating in the toilet and it has not been recorded.  Inpatient Medications    Scheduled Meds: . apixaban  2.5 mg Oral BID  . atorvastatin  40 mg Oral QPM  . citalopram  20 mg Oral Daily  . diltiazem  60 mg Oral Q6H  . furosemide  20 mg Intravenous BID  . insulin aspart  0-5 Units Subcutaneous QHS  . insulin aspart  0-9 Units Subcutaneous TID WC  . insulin glargine  40 Units Subcutaneous QHS  . magnesium oxide  400 mg Oral QPM  . multivitamin with minerals  1 tablet Oral QHS  . multivitamin-lutein  1 capsule Oral BID  . pantoprazole  40 mg Oral Daily  . potassium chloride  40 mEq Oral Once  . sodium chloride flush  3 mL Intravenous Q12H  . traZODone  100 mg Oral QHS  . venlafaxine XR  75 mg Oral Q breakfast   Continuous Infusions: . sodium chloride Stopped (09/30/18 0500)  . sodium chloride 100 mL/hr at 09/30/18 0655   PRN Meds: sodium chloride, acetaminophen **OR** acetaminophen, albuterol, ondansetron **OR** ondansetron (ZOFRAN) IV, polyethylene glycol, sodium chloride flush   Vital Signs    Vitals:   09/29/18 2059 09/30/18 0001 09/30/18 0236 09/30/18 0507  BP: (!) 96/57 102/60 (!) 89/50 105/68  Pulse: (!) 106 (!) 107  72  Resp: 18   18  Temp: 98.4 F (36.9 C)   97.9 F (36.6 C)  TempSrc: Oral   Oral  SpO2: 95% 97%  96%  Weight:      Height:        Intake/Output Summary (Last 24 hours) at 09/30/2018 0839 Last data filed at 09/30/2018 0500 Gross per 24 hour  Intake 1769.72 ml  Output 100 ml  Net 1669.72 ml   Filed Weights   09/28/18 1202 09/28/18 1210  Weight: 90.7 kg 90.7 kg    Telemetry    Sinus rhythm with PACs- Personally Reviewed  ECG    ECG from today pending- Personally Reviewed  Physical  Exam   GEN: No acute distress.   Neck: No JVD Cardiac: RRR, 2/4 diastolic rumbling murmur, no rubs, or gallops.  Respiratory:  Faint crackles at the bases bilaterally. GI: Soft, nontender, non-distended  MS: No edema; No deformity. Neuro:  Nonfocal  Psych: Normal affect   Labs    Chemistry Recent Labs  Lab 09/28/18 1256 09/29/18 0633 09/30/18 0642  NA 133* 136 139  K 4.0 3.0* 3.3*  CL 96* 97* 101  CO2 26 28 27   GLUCOSE 322* 137* 173*  BUN 40* 40* 46*  CREATININE 1.89* 1.60* 1.99*  CALCIUM 9.2 9.4 9.1  GFRNONAA 24* 29* 22*  GFRAA 28* 34* 26*  ANIONGAP 11 11 11      Hematology Recent Labs  Lab 09/28/18 1256 09/29/18 0633 09/30/18 0642  WBC 17.5* 16.3* 11.0*  RBC 4.39 4.28 3.96  HGB 12.0 11.6* 10.6*  HCT 38.7 38.1 36.2  MCV 88.2 89.0 91.4  MCH 27.3 27.1 26.8  MCHC 31.0 30.4 29.3*  RDW 14.0 14.0 14.0  PLT 237 236 229    Cardiac EnzymesNo results for input(s): TROPONINI in the last 168 hours. No results for input(s): TROPIPOC in the last 168 hours.   BNP  Recent Labs  Lab 09/28/18 1256  BNP 1,053.0*     DDimer No results for input(s): DDIMER in the last 168 hours.   Radiology    Ct Chest Wo Contrast  Result Date: 09/29/2018 CLINICAL DATA:  Acute respiratory illness. Shortness of breath. Pleural effusion. EXAM: CT CHEST WITHOUT CONTRAST TECHNIQUE: Multidetector CT imaging of the chest was performed following the standard protocol without IV contrast. COMPARISON:  None FINDINGS: Cardiovascular: The heart size is mildly enlarged. Aortic atherosclerosis. Calcification in the LAD, left circumflex and RCA coronary arteries. Mediastinum/Nodes: Normal appearance of the thyroid gland. The trachea appears patent and is midline. Small hiatal hernia. No mediastinal or hilar adenopathy. Lungs/Pleura: Small bilateral pleural effusions identified. There is mild interlobular septal thickening noted within the lung bases suggesting interstitial edema. Small pulmonary nodule in  the left upper lobe measures 2 mm, image 29/4. Within the superior segment of the right lower lobe there is a nodule within knee diameter of 5 mm, image 74/4. Dependent atelectasis noted within both lung bases. No bronchiectasis, interstitial reticulation or honeycombing identified. Upper Abdomen: No acute abnormality. Musculoskeletal: Spondylosis within the thoracic spine. IMPRESSION: 1. Suspect mild congestive heart failure with small bilateral pleural effusions and mild lung base interstitial edema. 2. Pulmonary nodules. No follow-up needed if patient is low-risk (and has no known or suspected primary neoplasm). Non-contrast chest CT can be considered in 12 months if patient is high-risk. This recommendation follows the consensus statement: Guidelines for Management of Incidental Pulmonary Nodules Detected on CT Images: From the Fleischner Society 2017; Radiology 2017; 284:228-243. 3. Aortic Atherosclerosis (ICD10-I70.0). Coronary artery calcifications. Electronically Signed   By: Kerby Moors M.D.   On: 09/29/2018 16:50   Dg Chest Port 1 View  Result Date: 09/28/2018 CLINICAL DATA:  Generalized weakness today. Low blood pressure. History of diabetes and hypertension. EXAM: PORTABLE CHEST 1 VIEW COMPARISON:  Radiographs 02/10/2017 and 01/14/2016. FINDINGS: 1228 hours. Stable cardiomegaly with mild vascular congestion. There is no overt pulmonary edema, confluent airspace opacity, pleural effusion or pneumothorax. Loop recorder overlies the lower left chest. No acute osseous findings are evident. Telemetry leads overlie the chest. IMPRESSION: Cardiomegaly with mild vascular congestion.  No edema. Electronically Signed   By: Richardean Sale M.D.   On: 09/28/2018 12:41    Cardiac Studies   Echocardiogram 09/29/2018:  1. The left ventricle has normal systolic function, with an ejection fraction of 55-60%. The cavity size was normal. There is moderately increased left ventricular wall thickness. Left  ventricular diastolic Doppler parameters are indeterminate.  2. Left atrial size was severely dilated.  3. The aortic valve is tricuspid. Mild thickening of the aortic valve. Mild calcification of the aortic valve. No stenosis of the aortic valve. Mild aortic annular calcification noted.  4. The mitral valve is abnormal. Moderate thickening of the mitral valve leaflet. Moderate calcification of the mitral valve leaflet. There is moderate mitral annular calcification present. moderate gradient across the mitral valve, mean gradient 9mmHg.  The MVA by PHT appears inaccurate, gradient would support moderate mitral stenosis mitral valve stenosis.  5. The aorta is normal in size and structure.  6. The aortic root is normal in size and structure.  7. Pulmonary hypertension is indeterminate, inadequate TR jet.  8. The interatrial septum was not well visualized.  Patient Profile     85 y.o. female with past medical history of paroxysmal atrial fibrillation (on Eliquis), HTN, HLD, Type 2 DM and prior CVA's who is being seen today for the evaluation  of atrial fibrillation with RVR at the request of Dr. [name].   Assessment & Plan    1.  Paroxysmal/rapid atrial fibrillation: Currently in sinus rhythm with PACs.  Severe left atrial enlargement and moderate mitral stenosis predisposed to high recurrence rate of atrial fibrillation.  She remains on renally dosed Eliquis 2.5 mg twice daily.  Currently on short acting diltiazem 60 mg every 6 hours.  Has been on Lopressor as an outpatient.  I will transition to to Cardizem CD 240 mg daily.  No Lopressor at the time of discharge.  She will follow-up with Dr. Caryl Comes.  2.  Acute diastolic heart failure: Currently on IV Lasix 20 mg twice daily.  Uncertain about I/O accuracy.  BUN up to 46 and creatinine up to 1.99.  I will transition to oral Lasix 20 mg daily with supplemental potassium which should be continued at the time of discharge.  She is also on chlorthalidone  in the outpatient setting.  She is hypokalemic and this is being repleted.  3.  Hypertension: Blood pressure has been low normal.  Given need for long-acting diltiazem, losartan may need to be discontinued.  4.  Hyperlipidemia: Continue atorvastatin.  5.  Chronic kidney disease stage III: BUN up to 46 and creatinine up to 1.99.  I am switching IV Lasix to oral Lasix which will begin tomorrow.     For questions or updates, please contact [place] Please consult [email or web address] for contact info under Cardiology/STEMI.      Signed, Kate Sable, MD  09/30/2018, 8:39 AM

## 2018-09-30 NOTE — Progress Notes (Signed)
PROGRESS NOTE  Amy Wiggins XMI:[id number] DOB: [birth year] DOA: 09/28/2018 PCP: Asencion Noble, MD  Brief History:  85 year old female with history of diabetes mellitus type 2, hypertension, hyperlipidemia, paroxysmal atrial fibrillation, stroke, PFO presenting with 2 to 3-day history of shortness of breath and generalized weakness.  The patient states that she has had the above symptoms since she returned from the grocery store on 09/26/2018.  She states that the symptoms have progressed.  She complain of some subjective fevers and chills but denied any headache, coughing, chest pain, nausea, vomiting, diarrhea, domino pain, dysuria.  Because of continued shortness of breath, she presented for further evaluation.  Patient states that she frequently misses her medications at home, up to 3 days per week.  In the ED, the patient was noted to have heart rate in the 140s.  EKG showed atrial fibrillation with RVR.  She was started on diltiazem drip which has been since converted to oral diltiazem.  Chest x-ray showed pulmonary vascular congestion.  BNP was 1053.  She was on IV furosemide.  Cardiology was consulted to assist with management.  Assessment/Plan: Acute respiratory failure with hypoxia -Secondary to CHF -Presently stable on 2 L nasal cannula -Wean oxygen for saturation 82%  Acute diastolic CHF -50/04/7046 echo EF 55-60%, grade 1 DD, no WMA -IV furosemide discontinued 8/5 and now on oral lasix.  -Monitor I/Os and clinical response -Patient stated that she weighed 202 pounds at home -Repeated echocardiogram noted below.   Atrial fibrillation with RVR -CHADSVASc = 7 -continue apixaban -cardizem 240 mg started by cardiology 09/30/18 -discontinued metoprolol per cardiology service -TSH 0.828 -she continues to go in and out of afib with HR up to 130s  Leukocytosis -Suspect stress demargination -UA without significant pyuria -Discontinue antibiotics monitor clinically -Blood  cultures x2 sets--patient had been started on ceftriaxone prior to blood cultures -CT chest: no source of infection reported.  -Chest x-ray without consolidation -discontinued ceftriaxone and monitoring clinically  CKD stage III -Baseline creatinine 1.4-1.7 -Monitor with diuresis  Uncontrolled diabetes mellitus type 2 with hyperglycemia -Continue Lantus -Continue NovoLog sliding scale, added prandial coverage novolog 3 U TIDAC -09/28/2018 hemoglobin A1c 9.3 CBG (last 3)  Recent Labs    09/29/18 2100 09/30/18 0741 09/30/18 1145  GLUCAP 253* 144* 283*   Depression/anxiety -Continue Celexa   Hyperlipidemia -Continue statin  Hypokalemia -this is being repleted. Mg within normal limits.   Disposition Plan:   Home in 2-3 days  Family Communication: Pt updated bedside  Consultants:  cardiology  Code Status:  FULL  DVT Prophylaxis:  apixaban   Procedures: Echocardiogram The left ventricle has normal systolic function, with an ejection fraction of 55-60%. The cavity size was normal. There is moderately increased left ventricular wall thickness. Left ventricular diastolic Doppler parameters are indeterminate.  2. Left atrial size was severely dilated.  3. The aortic valve is tricuspid. Mild thickening of the aortic valve. Mild calcification of the aortic valve. No stenosis of the aortic valve. Mild aortic annular calcification noted.  4. The mitral valve is abnormal. Moderate thickening of the mitral valve leaflet. Moderate calcification of the mitral valve leaflet. There is moderate mitral annular calcification present. moderate gradient across the mitral valve, mean gradient 53mmHg.  The MVA by PHT appears inaccurate, gradient would support moderate mitral stenosis mitral valve stenosis.  5. The aorta is normal in size and structure.  6. The aortic root is normal in size and structure.  7.  Pulmonary hypertension is indeterminate, inadequate TR jet.  8. The interatrial septum  was not well visualized.  Antibiotics: Ceftriaxone 8/3  Subjective: Patient had palpitations overnight, but no complaints at this time.   Objective: Vitals:   09/29/18 2059 09/30/18 0001 09/30/18 0236 09/30/18 0507  BP: (!) 96/57 102/60 (!) 89/50 105/68  Pulse: (!) 106 (!) 107  72  Resp: 18   18  Temp: 98.4 F (36.9 C)   97.9 F (36.6 C)  TempSrc: Oral   Oral  SpO2: 95% 97%  96%  Weight:      Height:        Intake/Output Summary (Last 24 hours) at 09/30/2018 1524 Last data filed at 09/30/2018 0500 Gross per 24 hour  Intake 1289.72 ml  Output 100 ml  Net 1189.72 ml   Weight change:  Exam:   General:  Pt is alert, follows commands appropriately, not in acute distress, cooperative.   HEENT: No icterus, No thrush, No neck mass, [place]/AT  Cardiovascular: irregular rate, normal S1/S2, no rubs, no gallops  Respiratory: Bibasilar crackles but no wheeze  Abdomen: Soft/+BS, non tender, non distended, no guarding  Extremities: No edema, No lymphangitis, No petechiae, No rashes, no synovitis   Data Reviewed: I have personally reviewed following labs and imaging studies Basic Metabolic Panel: Recent Labs  Lab 09/28/18 1256 09/28/18 1505 09/29/18 0633 09/30/18 0642  NA 133*  --  136 139  K 4.0  --  3.0* 3.3*  CL 96*  --  97* 101  CO2 26  --  28 27  GLUCOSE 322*  --  137* 173*  BUN 40*  --  40* 46*  CREATININE 1.89*  --  1.60* 1.99*  CALCIUM 9.2  --  9.4 9.1  MG  --  2.0  --  2.3   Liver Function Tests: No results for input(s): AST, ALT, ALKPHOS, BILITOT, PROT, ALBUMIN in the last 168 hours. No results for input(s): LIPASE, AMYLASE in the last 168 hours. No results for input(s): AMMONIA in the last 168 hours. Coagulation Profile: No results for input(s): INR, PROTIME in the last 168 hours. CBC: Recent Labs  Lab 09/28/18 1256 09/29/18 0633 09/30/18 0642  WBC 17.5* 16.3* 11.0*  NEUTROABS 15.6*  --   --   HGB 12.0 11.6* 10.6*  HCT 38.7 38.1 36.2  MCV 88.2 89.0  91.4  PLT 237 236 229   Cardiac Enzymes: No results for input(s): CKTOTAL, CKMB, CKMBINDEX, TROPONINI in the last 168 hours. BNP: Invalid input(s): POCBNP CBG: Recent Labs  Lab 09/29/18 1148 09/29/18 1705 09/29/18 2100 09/30/18 0741 09/30/18 1145  GLUCAP 222* 187* 253* 144* 283*   HbA1C: Recent Labs    09/28/18 1256  HGBA1C 9.3*   Urine analysis:    Component Value Date/Time   COLORURINE YELLOW 09/28/2018 1216   APPEARANCEUR HAZY (A) 09/28/2018 1216   LABSPEC 1.007 09/28/2018 1216   PHURINE 5.0 09/28/2018 1216   GLUCOSEU >=500 (A) 09/28/2018 1216   HGBUR NEGATIVE 09/28/2018 1216   BILIRUBINUR NEGATIVE 09/28/2018 1216   KETONESUR NEGATIVE 09/28/2018 1216   PROTEINUR NEGATIVE 09/28/2018 1216   UROBILINOGEN 0.2 12/12/2010 1102   NITRITE NEGATIVE 09/28/2018 1216   LEUKOCYTESUR MODERATE (A) 09/28/2018 1216   Sepsis Labs: @LABRCNTIP (procalcitonin:4,lacticidven:4) ) Recent Results (from the past 240 hour(s))  Urine culture     Status: Abnormal   Collection Time: 09/28/18 12:16 PM   Specimen: Urine, Clean Catch  Result Value Ref Range Status   Specimen Description   Final  URINE, CLEAN CATCH Performed at [facility], [address], [place], [place] [address]    Special Requests   Final    NONE Performed at [facility], [address] [place], [place] [address]    Culture >=100,000 COLONIES/mL ESCHERICHIA COLI (A)  Final   Report Status 09/30/2018 FINAL  Final   Organism ID, Bacteria ESCHERICHIA COLI (A)  Final      Susceptibility   Escherichia coli - MIC*    AMPICILLIN 8 SENSITIVE Sensitive     CEFAZOLIN <=4 SENSITIVE Sensitive     CEFTRIAXONE <=1 SENSITIVE Sensitive     CIPROFLOXACIN <=0.25 SENSITIVE Sensitive     GENTAMICIN <=1 SENSITIVE Sensitive     IMIPENEM <=0.25 SENSITIVE Sensitive     NITROFURANTOIN 32 SENSITIVE Sensitive     TRIMETH/SULFA <=20 SENSITIVE Sensitive     AMPICILLIN/SULBACTAM 4 SENSITIVE Sensitive     PIP/TAZO <=4 SENSITIVE  Sensitive     Extended ESBL NEGATIVE Sensitive     * >=100,000 COLONIES/mL ESCHERICHIA COLI  SARS CORONAVIRUS 2 Nasal Swab Aptima Multi Swab     Status: None   Collection Time: 09/28/18 12:17 PM   Specimen: Aptima Multi Swab; Nasal Swab  Result Value Ref Range Status   SARS Coronavirus 2 NEGATIVE NEGATIVE Final    Comment: (NOTE) SARS-CoV-2 target nucleic acids are NOT DETECTED. The SARS-CoV-2 RNA is generally detectable in upper and lower respiratory specimens during the acute phase of infection. Negative results do not preclude SARS-CoV-2 infection, do not rule out co-infections with other pathogens, and should not be used as the sole basis for treatment or other patient management decisions. Negative results must be combined with clinical observations, patient history, and epidemiological information. The expected result is Negative. Fact Sheet for Patients: SugarRoll.be Fact Sheet for Healthcare Providers: https://www.woods-mathews.com/ This test is not yet approved or cleared by the [place] FDA and  has been authorized for detection and/or diagnosis of SARS-CoV-2 by FDA under an Emergency Use Authorization (EUA). This EUA will remain  in effect (meaning this test can be used) for the duration of the COVID-19 declaration under Section 56 4(b)(1) of the Act, 21 U.S.C. section 360bbb-3(b)(1), unless the authorization is terminated or revoked sooner. Performed at [facility], [place] [address] [place], [place] [address]   Culture, blood (Routine X 2) w Reflex to ID Panel     Status: None (Preliminary result)   Collection Time: 09/29/18  6:53 AM   Specimen: BLOOD RIGHT HAND  Result Value Ref Range Status   Specimen Description BLOOD RIGHT HAND  Final   Special Requests   Final    BOTTLES DRAWN AEROBIC AND ANAEROBIC Blood Culture adequate volume   Culture   Final    NO GROWTH 1 DAY Performed at [facility], [address] [place], [place] [address]    Report Status PENDING  Incomplete  Culture, blood (Routine X 2) w Reflex to ID Panel     Status: None (Preliminary result)   Collection Time: 09/29/18  7:22 AM   Specimen: BLOOD  Result Value Ref Range Status   Specimen Description BLOOD  Final   Special Requests NONE  Final   Culture   Final    NO GROWTH 1 DAY Performed at [facility], [address], [place], [place] [address]    Report Status PENDING  Incomplete     Scheduled Meds: . apixaban  2.5 mg Oral BID  . atorvastatin  40 mg Oral QPM  .  citalopram  20 mg Oral Daily  . diltiazem  240 mg Oral Daily  . furosemide  20 mg Oral Daily  . insulin aspart  0-5 Units Subcutaneous QHS  . insulin aspart  0-9 Units Subcutaneous TID WC  . insulin glargine  40 Units Subcutaneous QHS  . magnesium oxide  400 mg Oral QPM  . multivitamin with minerals  1 tablet Oral QHS  . multivitamin-lutein  1 capsule Oral BID  . pantoprazole  40 mg Oral Daily  . sodium chloride flush  3 mL Intravenous Q12H  . traZODone  100 mg Oral QHS  . venlafaxine XR  75 mg Oral Q breakfast   Continuous Infusions: . sodium chloride Stopped (09/30/18 0500)  . sodium chloride 100 mL/hr at 09/30/18 6269    Procedures/Studies: Ct Chest Wo Contrast  Result Date: 09/29/2018 CLINICAL DATA:  Acute respiratory illness. Shortness of breath. Pleural effusion. EXAM: CT CHEST WITHOUT CONTRAST TECHNIQUE: Multidetector CT imaging of the chest was performed following the standard protocol without IV contrast. COMPARISON:  None FINDINGS: Cardiovascular: The heart size is mildly enlarged. Aortic atherosclerosis. Calcification in the LAD, left circumflex and RCA coronary arteries. Mediastinum/Nodes: Normal appearance of the thyroid gland. The trachea appears patent and is midline. Small hiatal hernia. No mediastinal or hilar adenopathy. Lungs/Pleura: Small bilateral pleural effusions identified. There is mild interlobular septal thickening noted within  the lung bases suggesting interstitial edema. Small pulmonary nodule in the left upper lobe measures 2 mm, image 29/4. Within the superior segment of the right lower lobe there is a nodule within knee diameter of 5 mm, image 74/4. Dependent atelectasis noted within both lung bases. No bronchiectasis, interstitial reticulation or honeycombing identified. Upper Abdomen: No acute abnormality. Musculoskeletal: Spondylosis within the thoracic spine. IMPRESSION: 1. Suspect mild congestive heart failure with small bilateral pleural effusions and mild lung base interstitial edema. 2. Pulmonary nodules. No follow-up needed if patient is low-risk (and has no known or suspected primary neoplasm). Non-contrast chest CT can be considered in 12 months if patient is high-risk. This recommendation follows the consensus statement: Guidelines for Management of Incidental Pulmonary Nodules Detected on CT Images: From the Fleischner Society 2017; Radiology 2017; 284:228-243. 3. Aortic Atherosclerosis (ICD10-I70.0). Coronary artery calcifications. Electronically Signed   By: Kerby Moors M.D.   On: 09/29/2018 16:50   Dg Chest Port 1 View  Result Date: 09/28/2018 CLINICAL DATA:  Generalized weakness today. Low blood pressure. History of diabetes and hypertension. EXAM: PORTABLE CHEST 1 VIEW COMPARISON:  Radiographs 02/10/2017 and 01/14/2016. FINDINGS: 1228 hours. Stable cardiomegaly with mild vascular congestion. There is no overt pulmonary edema, confluent airspace opacity, pleural effusion or pneumothorax. Loop recorder overlies the lower left chest. No acute osseous findings are evident. Telemetry leads overlie the chest. IMPRESSION: Cardiomegaly with mild vascular congestion.  No edema. Electronically Signed   By: Richardean Sale M.D.   On: 09/28/2018 12:41    Irwin Brakeman, MD  [facility] How to contact the [facility] Attending or Consulting provider [place] or covering provider during after hours [place], for this  patient?  1. Check the care team in [facility] and look for a) attending/consulting [facility] provider listed and b) the [facility] team listed 2. Log into [email or web address] and use [place]'s universal password to access. If you do not have the password, please contact the hospital operator. 3. Locate the [facility] provider you are looking for under [facility] and page to a number that you can be directly reached. 4. If you  still have difficulty reaching the provider, please page the [facility] (Director on Call) for the Hospitalists listed on amion for assistance.   If 7PM-7AM, please contact night-coverage [email or web address] Password [email or web address] 09/30/2018, 3:24 PM   LOS: 1 day

## 2018-09-30 NOTE — Progress Notes (Signed)
Night shift hospitalist telemetry coverage note.  The patient developed A. fib with RVR in the 120s to 130s.  She is already on apixaban.Metoprolol 2.5 mg IVP, magnesium sulfate 2 g IVPB and 20 mEq of oral potassium were given.  Tennis Must, MD

## 2018-09-30 NOTE — Progress Notes (Signed)
Patient hr noted to be Afib with RVR sustaining in 120's to low 130's. BP low systolic notified on call orders received and implemented.

## 2018-09-30 NOTE — Progress Notes (Signed)
Inpatient Diabetes Program Recommendations  AACE/ADA: New Consensus Statement on Inpatient Glycemic Control (2015)  Target Ranges:  Prepandial:   less than 140 mg/dL      Peak postprandial:   less than 180 mg/dL (1-2 hours)      Critically ill patients:  140 - 180 mg/dL   Lab Results  Component Value Date   GLUCAP 283 (H) 09/30/2018   HGBA1C 9.3 (H) 09/28/2018    Review of Glycemic Control  Diabetes history: DM2 Outpatient Diabetes medications: Lantus 44 units QD Current orders for Inpatient glycemic control: Lantus 40 units QHS, Novolog 0-9 units tidwc and hs  HgbA1C - 9.3% Post-prandials elevated.  Inpatient Diabetes Program Recommendations:     Add Novolog 3 units tidwc for meal coverage insulin if pt eats > 50% meal.  Will continue to follow.  Thank you. Lorenda Peck, RD, LDN, CDE Inpatient Diabetes Coordinator [phone]

## 2018-09-30 NOTE — Progress Notes (Signed)
Patients heart rate sustaining between 120's to 130's, noted Afib with RVR. Vital signs obtained. Dr. Olevia Bowens notified. New orders placed.

## 2018-10-01 DIAGNOSIS — I5032 Chronic diastolic (congestive) heart failure: Secondary | ICD-10-CM

## 2018-10-01 DIAGNOSIS — E1159 Type 2 diabetes mellitus with other circulatory complications: Secondary | ICD-10-CM

## 2018-10-01 LAB — GLUCOSE, CAPILLARY
Glucose-Capillary: 234 mg/dL — ABNORMAL HIGH (ref 70–99)
Glucose-Capillary: 85 mg/dL (ref 70–99)
Glucose-Capillary: 118 mg/dL — ABNORMAL HIGH (ref 70–99)
Glucose-Capillary: 142 mg/dL — ABNORMAL HIGH (ref 70–99)
Glucose-Capillary: 265 mg/dL — ABNORMAL HIGH (ref 70–99)

## 2018-10-01 LAB — BASIC METABOLIC PANEL
Anion gap: 8 (ref 5–15)
BUN: 35 mg/dL — ABNORMAL HIGH (ref 8–23)
CO2: 28 mmol/L (ref 22–32)
Calcium: 9.3 mg/dL (ref 8.9–10.3)
Chloride: 101 mmol/L (ref 98–111)
Creatinine, Ser: 1.46 mg/dL — ABNORMAL HIGH (ref 0.44–1.00)
GFR calc Af Amer: 38 mL/min — ABNORMAL LOW (ref >60)
GFR calc non Af Amer: 33 mL/min — ABNORMAL LOW (ref >60)
Glucose, Bld: 111 mg/dL — ABNORMAL HIGH (ref 70–99)
Potassium: 3.3 mmol/L — ABNORMAL LOW (ref 3.5–5.1)
Sodium: 137 mmol/L (ref 135–145)

## 2018-10-01 LAB — CBC
HCT: 37.8 % (ref 36.0–46.0)
Hemoglobin: 11.5 g/dL — ABNORMAL LOW (ref 12.0–15.0)
MCH: 27.6 pg (ref 26.0–34.0)
MCHC: 30.4 g/dL (ref 30.0–36.0)
MCV: 90.9 fL (ref 80.0–100.0)
Platelets: 254 10*3/uL (ref 150–400)
RBC: 4.16 MIL/uL (ref 3.87–5.11)
RDW: 14 % (ref 11.5–15.5)
WBC: 11.7 10*3/uL — ABNORMAL HIGH (ref 4.0–10.5)
nRBC: 0 % (ref 0.0–0.2)

## 2018-10-01 MED ORDER — METOPROLOL TARTRATE 5 MG/5ML IV SOLN
5.0000 mg | Freq: Once | INTRAVENOUS | Status: AC
  Administered 2018-10-01: 12:00:00 5 mg via INTRAVENOUS

## 2018-10-01 MED ORDER — POTASSIUM CHLORIDE CRYS ER 20 MEQ PO TBCR: 40.0000 meq | EXTENDED_RELEASE_TABLET | Freq: Once | ORAL | Status: DC

## 2018-10-01 MED ORDER — POTASSIUM CHLORIDE CRYS ER 20 MEQ PO TBCR
40.0000 meq | EXTENDED_RELEASE_TABLET | Freq: Every day | ORAL | Status: DC
  Administered 2018-10-01 – 2018-10-06 (×6): 40 meq via ORAL
  Filled 2018-10-01 (×7): qty 2

## 2018-10-01 MED ORDER — FUROSEMIDE 10 MG/ML IJ SOLN
40.0000 mg | Freq: Once | INTRAMUSCULAR | Status: AC
  Administered 2018-10-01: 10:00:00 40 mg via INTRAVENOUS

## 2018-10-01 MED ORDER — AMIODARONE HCL 150 MG/3ML IV SOLN
150.0000 mg | Freq: Once | INTRAVENOUS | Status: AC
  Administered 2018-10-01: 10:00:00 150 mg via INTRAVENOUS
  Filled 2018-10-01: qty 3

## 2018-10-01 MED ORDER — METOPROLOL TARTRATE 5 MG/5ML IV SOLN
INTRAVENOUS | Status: AC
  Filled 2018-10-01: qty 5

## 2018-10-01 MED ORDER — FUROSEMIDE 10 MG/ML IJ SOLN
INTRAMUSCULAR | Status: AC
  Filled 2018-10-01: qty 4

## 2018-10-01 MED ORDER — AMIODARONE HCL 200 MG PO TABS
200.0000 mg | ORAL_TABLET | Freq: Two times a day (BID) | ORAL | Status: DC
  Administered 2018-10-01 – 2018-10-06 (×10): 200 mg via ORAL
  Filled 2018-10-01 (×11): qty 1

## 2018-10-01 NOTE — Progress Notes (Signed)
Patient resting in bed. BP 100/60, Heart rate 100 at this time. Will continue to monitor patient.

## 2018-10-01 NOTE — Progress Notes (Signed)
PROGRESS NOTE  Amy Wiggins ZOX:[id number] DOB: August 03, 1931 DOA: 09/28/2018 PCP: Asencion Noble, MD  Brief History:  85 year old female with history of diabetes mellitus type 2, hypertension, hyperlipidemia, paroxysmal atrial fibrillation, stroke, PFO presenting with 2 to 3-day history of shortness of breath and generalized weakness.  The patient states that she has had the above symptoms since she returned from the grocery store on 09/26/2018.  She states that the symptoms have progressed.  She complain of some subjective fevers and chills but denied any headache, coughing, chest pain, nausea, vomiting, diarrhea, domino pain, dysuria.  Because of continued shortness of breath, she presented for further evaluation.  Patient states that she frequently misses her medications at home, up to 3 days per week.  In the ED, the patient was noted to have heart rate in the 140s.  EKG showed atrial fibrillation with RVR.  She was started on diltiazem drip which has been since converted to oral diltiazem.  Chest x-ray showed pulmonary vascular congestion.  BNP was 1053.  She was on IV furosemide.  Cardiology was consulted to assist with management.  Assessment/Plan: Acute respiratory failure with hypoxia -Secondary to CHF -Presently stable on 2 L nasal cannula -Wean oxygen for saturation 81%  Acute diastolic CHF -19/14/7829 echo EF 55-60%, grade 1 DD, no WMA -IV furosemide discontinued 8/5 and now on oral lasix.  -Monitor I/Os and clinical response -Patient stated that she weighed 202 pounds at home -Repeated echocardiogram noted below.   Atrial fibrillation with RVR -CHADSVASc = 7 -continue apixaban -cardizem 240 mg started by cardiology 09/30/18 -discontinued metoprolol per cardiology service, started amiodarone 8/6 -TSH 0.828 -she continues to go in and out of afib with HR up to 130s  Leukocytosis -Suspect stress demargination -UA without significant pyuria -Discontinue antibiotics  monitor clinically -Blood cultures x2 sets--patient had been started on ceftriaxone prior to blood cultures -CT chest: no source of infection reported.  -Chest x-ray without consolidation -discontinued ceftriaxone and monitoring clinically  CKD stage III -Baseline creatinine 1.4-1.7 -Monitor with diuresis  Uncontrolled diabetes mellitus type 2 with hyperglycemia -Continue Lantus -Continue NovoLog sliding scale, added prandial coverage novolog 3 U TIDAC -09/28/2018 hemoglobin A1c 9.3 CBG (last 3)  Recent Labs    09/30/18 2129 10/01/18 0728 10/01/18 1121  GLUCAP 176* 85 118*   Depression/anxiety -Continue Celexa   Hyperlipidemia -Continue statin  Hypokalemia -this is being repleted. Mg within normal limits.   Disposition Plan:  Continue inpatient treatments Family Communication: Pt updated bedside  Consultants:  cardiology  Code Status:  FULL  DVT Prophylaxis:  apixaban   Procedures: Echocardiogram The left ventricle has normal systolic function, with an ejection fraction of 55-60%. The cavity size was normal. There is moderately increased left ventricular wall thickness. Left ventricular diastolic Doppler parameters are indeterminate.  2. Left atrial size was severely dilated.  3. The aortic valve is tricuspid. Mild thickening of the aortic valve. Mild calcification of the aortic valve. No stenosis of the aortic valve. Mild aortic annular calcification noted.  4. The mitral valve is abnormal. Moderate thickening of the mitral valve leaflet. Moderate calcification of the mitral valve leaflet. There is moderate mitral annular calcification present. moderate gradient across the mitral valve, mean gradient 91mmHg.  The MVA by PHT appears inaccurate, gradient would support moderate mitral stenosis mitral valve stenosis.  5. The aorta is normal in size and structure.  6. The aortic root is normal in size and structure.  7.  Pulmonary hypertension is indeterminate, inadequate TR  jet.  8. The interatrial septum was not well visualized.  Antibiotics: Ceftriaxone 8/3  Subjective: Patient c/o shortness of breath this morning, she went back into Afib RVR.   Objective: Vitals:   09/30/18 2127 09/30/18 2317 10/01/18 0041 10/01/18 0523  BP: 139/61 (!) 113/52 100/60 (!) 108/58  Pulse: 86 70  (!) 104  Resp: 16 18  18   Temp: 98 F (36.7 C)   97.9 F (36.6 C)  TempSrc: Oral   Oral  SpO2: 100% 95%  98%  Weight:      Height:        Intake/Output Summary (Last 24 hours) at 10/01/2018 1352 Last data filed at 10/01/2018 1100 Gross per 24 hour  Intake 2872.31 ml  Output 400 ml  Net 2472.31 ml   Weight change:  Exam:   General:  Pt is alert, follows commands appropriately, not in acute distress, cooperative.   HEENT: No icterus, No thrush, No neck mass, [place]/AT  Cardiovascular: irregular rate, normal S1/S2, no rubs, no gallops  Respiratory: Bibasilar crackles but no wheeze  Abdomen: Soft/+BS, non tender, non distended, no guarding  Extremities: No edema, No lymphangitis, No petechiae, No rashes, no synovitis   Data Reviewed: I have personally reviewed following labs and imaging studies Basic Metabolic Panel: Recent Labs  Lab 09/28/18 1256 09/28/18 1505 09/29/18 0633 09/30/18 0642 10/01/18 0916  NA 133*  --  136 139 137  K 4.0  --  3.0* 3.3* 3.3*  CL 96*  --  97* 101 101  CO2 26  --  28 27 28   GLUCOSE 322*  --  137* 173* 111*  BUN 40*  --  40* 46* 35*  CREATININE 1.89*  --  1.60* 1.99* 1.46*  CALCIUM 9.2  --  9.4 9.1 9.3  MG  --  2.0  --  2.3  --    Liver Function Tests: No results for input(s): AST, ALT, ALKPHOS, BILITOT, PROT, ALBUMIN in the last 168 hours. No results for input(s): LIPASE, AMYLASE in the last 168 hours. No results for input(s): AMMONIA in the last 168 hours. Coagulation Profile: No results for input(s): INR, PROTIME in the last 168 hours. CBC: Recent Labs  Lab 09/28/18 1256 09/29/18 0633 09/30/18 0642 10/01/18 0916   WBC 17.5* 16.3* 11.0* 11.7*  NEUTROABS 15.6*  --   --   --   HGB 12.0 11.6* 10.6* 11.5*  HCT 38.7 38.1 36.2 37.8  MCV 88.2 89.0 91.4 90.9  PLT 237 236 229 254   Cardiac Enzymes: No results for input(s): CKTOTAL, CKMB, CKMBINDEX, TROPONINI in the last 168 hours. BNP: Invalid input(s): POCBNP CBG: Recent Labs  Lab 09/30/18 1145 09/30/18 1611 09/30/18 2129 10/01/18 0728 10/01/18 1121  GLUCAP 283* 234* 176* 85 118*   HbA1C: No results for input(s): HGBA1C in the last 72 hours. Urine analysis:    Component Value Date/Time   COLORURINE YELLOW 09/28/2018 1216   APPEARANCEUR HAZY (A) 09/28/2018 1216   LABSPEC 1.007 09/28/2018 1216   PHURINE 5.0 09/28/2018 1216   GLUCOSEU >=500 (A) 09/28/2018 1216   HGBUR NEGATIVE 09/28/2018 1216   BILIRUBINUR NEGATIVE 09/28/2018 1216   KETONESUR NEGATIVE 09/28/2018 1216   PROTEINUR NEGATIVE 09/28/2018 1216   UROBILINOGEN 0.2 12/12/2010 1102   NITRITE NEGATIVE 09/28/2018 1216   LEUKOCYTESUR MODERATE (A) 09/28/2018 1216   Sepsis Labs: @LABRCNTIP (procalcitonin:4,lacticidven:4) ) Recent Results (from the past 240 hour(s))  Urine culture     Status: Abnormal   Collection Time:  09/28/18 12:16 PM   Specimen: Urine, Clean Catch  Result Value Ref Range Status   Specimen Description   Final    URINE, CLEAN CATCH Performed at [facility], [address] [place], [place] [address]    Special Requests   Final    NONE Performed at [facility], [address] [place], [place] [address]    Culture >=100,000 COLONIES/mL ESCHERICHIA COLI (A)  Final   Report Status 09/30/2018 FINAL  Final   Organism ID, Bacteria ESCHERICHIA COLI (A)  Final      Susceptibility   Escherichia coli - MIC*    AMPICILLIN 8 SENSITIVE Sensitive     CEFAZOLIN <=4 SENSITIVE Sensitive     CEFTRIAXONE <=1 SENSITIVE Sensitive     CIPROFLOXACIN <=0.25 SENSITIVE Sensitive     GENTAMICIN <=1 SENSITIVE Sensitive     IMIPENEM <=0.25 SENSITIVE Sensitive     NITROFURANTOIN 32  SENSITIVE Sensitive     TRIMETH/SULFA <=20 SENSITIVE Sensitive     AMPICILLIN/SULBACTAM 4 SENSITIVE Sensitive     PIP/TAZO <=4 SENSITIVE Sensitive     Extended ESBL NEGATIVE Sensitive     * >=100,000 COLONIES/mL ESCHERICHIA COLI  SARS CORONAVIRUS 2 Nasal Swab Aptima Multi Swab     Status: None   Collection Time: 09/28/18 12:17 PM   Specimen: Aptima Multi Swab; Nasal Swab  Result Value Ref Range Status   SARS Coronavirus 2 NEGATIVE NEGATIVE Final    Comment: (NOTE) SARS-CoV-2 target nucleic acids are NOT DETECTED. The SARS-CoV-2 RNA is generally detectable in upper and lower respiratory specimens during the acute phase of infection. Negative results do not preclude SARS-CoV-2 infection, do not rule out co-infections with other pathogens, and should not be used as the sole basis for treatment or other patient management decisions. Negative results must be combined with clinical observations, patient history, and epidemiological information. The expected result is Negative. Fact Sheet for Patients: SugarRoll.be Fact Sheet for Healthcare Providers: https://www.woods-mathews.com/ This test is not yet approved or cleared by the [place] FDA and  has been authorized for detection and/or diagnosis of SARS-CoV-2 by FDA under an Emergency Use Authorization (EUA). This EUA will remain  in effect (meaning this test can be used) for the duration of the COVID-19 declaration under Section 56 4(b)(1) of the Act, 21 U.S.C. section 360bbb-3(b)(1), unless the authorization is terminated or revoked sooner. Performed at [facility], [place] [address], [place], [place] [address]   Culture, blood (Routine X 2) w Reflex to ID Panel     Status: None (Preliminary result)   Collection Time: 09/29/18  6:53 AM   Specimen: BLOOD RIGHT HAND  Result Value Ref Range Status   Specimen Description BLOOD RIGHT HAND  Final   Special Requests   Final    BOTTLES  DRAWN AEROBIC AND ANAEROBIC Blood Culture adequate volume   Culture   Final    NO GROWTH 2 DAYS Performed at [facility], [address], [place], [place] [address]    Report Status PENDING  Incomplete  Culture, blood (Routine X 2) w Reflex to ID Panel     Status: None (Preliminary result)   Collection Time: 09/29/18  7:22 AM   Specimen: BLOOD  Result Value Ref Range Status   Specimen Description BLOOD  Final   Special Requests NONE  Final   Culture   Final    NO GROWTH 2 DAYS Performed at [facility], [address] [place], [place] [address]    Report Status PENDING  Incomplete     Scheduled Meds: . amiodarone  200 mg Oral BID  . apixaban  2.5 mg Oral BID  . atorvastatin  40 mg Oral QPM  . citalopram  20 mg Oral Daily  . diltiazem  240 mg Oral Daily  . furosemide      . furosemide  20 mg Oral Daily  . insulin aspart  0-5 Units Subcutaneous QHS  . insulin aspart  0-9 Units Subcutaneous TID WC  . insulin aspart  3 Units Subcutaneous TID WC  . insulin glargine  40 Units Subcutaneous QHS  . magnesium oxide  400 mg Oral QPM  . metoprolol tartrate      . multivitamin with minerals  1 tablet Oral QHS  . multivitamin-lutein  1 capsule Oral BID  . pantoprazole  40 mg Oral Daily  . potassium chloride  40 mEq Oral Daily  . sodium chloride flush  3 mL Intravenous Q12H  . traZODone  100 mg Oral QHS  . venlafaxine XR  75 mg Oral Q breakfast   Continuous Infusions: . sodium chloride Stopped (09/30/18 0500)    Procedures/Studies: Ct Chest Wo Contrast  Result Date: 09/29/2018 CLINICAL DATA:  Acute respiratory illness. Shortness of breath. Pleural effusion. EXAM: CT CHEST WITHOUT CONTRAST TECHNIQUE: Multidetector CT imaging of the chest was performed following the standard protocol without IV contrast. COMPARISON:  None FINDINGS: Cardiovascular: The heart size is mildly enlarged. Aortic atherosclerosis. Calcification in the LAD, left circumflex and RCA coronary arteries.  Mediastinum/Nodes: Normal appearance of the thyroid gland. The trachea appears patent and is midline. Small hiatal hernia. No mediastinal or hilar adenopathy. Lungs/Pleura: Small bilateral pleural effusions identified. There is mild interlobular septal thickening noted within the lung bases suggesting interstitial edema. Small pulmonary nodule in the left upper lobe measures 2 mm, image 29/4. Within the superior segment of the right lower lobe there is a nodule within knee diameter of 5 mm, image 74/4. Dependent atelectasis noted within both lung bases. No bronchiectasis, interstitial reticulation or honeycombing identified. Upper Abdomen: No acute abnormality. Musculoskeletal: Spondylosis within the thoracic spine. IMPRESSION: 1. Suspect mild congestive heart failure with small bilateral pleural effusions and mild lung base interstitial edema. 2. Pulmonary nodules. No follow-up needed if patient is low-risk (and has no known or suspected primary neoplasm). Non-contrast chest CT can be considered in 12 months if patient is high-risk. This recommendation follows the consensus statement: Guidelines for Management of Incidental Pulmonary Nodules Detected on CT Images: From the Fleischner Society 2017; Radiology 2017; 284:228-243. 3. Aortic Atherosclerosis (ICD10-I70.0). Coronary artery calcifications. Electronically Signed   By: Kerby Moors M.D.   On: 09/29/2018 16:50   Dg Chest Port 1 View  Result Date: 09/28/2018 CLINICAL DATA:  Generalized weakness today. Low blood pressure. History of diabetes and hypertension. EXAM: PORTABLE CHEST 1 VIEW COMPARISON:  Radiographs 02/10/2017 and 01/14/2016. FINDINGS: 1228 hours. Stable cardiomegaly with mild vascular congestion. There is no overt pulmonary edema, confluent airspace opacity, pleural effusion or pneumothorax. Loop recorder overlies the lower left chest. No acute osseous findings are evident. Telemetry leads overlie the chest. IMPRESSION: Cardiomegaly with mild  vascular congestion.  No edema. Electronically Signed   By: Richardean Sale M.D.   On: 09/28/2018 12:41    Irwin Brakeman, MD  [facility] How to contact the [facility] Attending or Consulting provider [place] or covering provider during after hours [place], for this patient?  1. Check the care team in [facility] and look for a) attending/consulting [facility] provider  listed and b) the [facility] team listed 2. Log into [email or web address] and use [place]'s universal password to access. If you do not have the password, please contact the hospital operator. 3. Locate the [facility] provider you are looking for under [facility] and page to a number that you can be directly reached. 4. If you still have difficulty reaching the provider, please page the [facility] (Director on Call) for the Hospitalists listed on amion for assistance.   If 7PM-7AM, please contact night-coverage [email or web address] Password [email or web address] 10/01/2018, 1:52 PM   LOS: 2 days

## 2018-10-01 NOTE — Progress Notes (Signed)
Progress Note  Patient Name: Amy Wiggins Date of Encounter: 10/01/2018  Primary Cardiologist: Virl Axe, MD   Subjective   She does not feel as well as she did yesterday.  She feels fatigued.  She also complains of left ankle pain.  Inpatient Medications    Scheduled Meds:  amiodarone  150 mg Intravenous Once   apixaban  2.5 mg Oral BID   atorvastatin  40 mg Oral QPM   citalopram  20 mg Oral Daily   diltiazem  240 mg Oral Daily   furosemide  20 mg Oral Daily   insulin aspart  0-5 Units Subcutaneous QHS   insulin aspart  0-9 Units Subcutaneous TID WC   insulin aspart  3 Units Subcutaneous TID WC   insulin glargine  40 Units Subcutaneous QHS   magnesium oxide  400 mg Oral QPM   multivitamin with minerals  1 tablet Oral QHS   multivitamin-lutein  1 capsule Oral BID   pantoprazole  40 mg Oral Daily   potassium chloride  40 mEq Oral Daily   sodium chloride flush  3 mL Intravenous Q12H   traZODone  100 mg Oral QHS   venlafaxine XR  75 mg Oral Q breakfast   Continuous Infusions:  sodium chloride Stopped (09/30/18 0500)   sodium chloride 100 mL/hr at 10/01/18 0650   PRN Meds: sodium chloride, acetaminophen **OR** acetaminophen, albuterol, ondansetron **OR** ondansetron (ZOFRAN) IV, polyethylene glycol, sodium chloride flush   Vital Signs    Vitals:   09/30/18 2127 09/30/18 2317 10/01/18 0041 10/01/18 0523  BP: 139/61 (!) 113/52 100/60 (!) 108/58  Pulse: 86 70  (!) 104  Resp: 16 18  18   Temp: 98 F (36.7 C)   97.9 F (36.6 C)  TempSrc: Oral   Oral  SpO2: 100% 95%  98%  Weight:      Height:        Intake/Output Summary (Last 24 hours) at 10/01/2018 1004 Last data filed at 10/01/2018 0500 Gross per 24 hour  Intake 2872.31 ml  Output --  Net 2872.31 ml   Filed Weights   09/28/18 1202 09/28/18 1210  Weight: 90.7 kg 90.7 kg    Telemetry    Rapid atrial fibrillation, heart rate fluctuating between 110 and 140 at present- Personally  Reviewed  Physical Exam   GEN: No acute distress.   Neck: No JVD Cardiac:  Tachycardic, irregular, 2/4 diastolic rumbling murmur, no rubs, or gallops.  Respiratory:  Bilateral rhonchi, faint rales, and wheezes. GI: Soft, nontender, non-distended  MS: No edema; No deformity.  Left lateral ankle tenderness. Neuro:  Nonfocal  Psych: Normal affect   Labs    Chemistry Recent Labs  Lab 09/29/18 [phone] 09/30/18 0642 10/01/18 0916  NA 136 139 137  K 3.0* 3.3* 3.3*  CL 97* 101 101  CO2 28 27 28   GLUCOSE 137* 173* 111*  BUN 40* 46* 35*  CREATININE 1.60* 1.99* 1.46*  CALCIUM 9.4 9.1 9.3  GFRNONAA 29* 22* 33*  GFRAA 34* 26* 38*  ANIONGAP 11 11 8      Hematology Recent Labs  Lab 09/29/18 9485 09/30/18 0642 10/01/18 0916  WBC 16.3* 11.0* 11.7*  RBC 4.28 3.96 4.16  HGB 11.6* 10.6* 11.5*  HCT 38.1 36.2 37.8  MCV 89.0 91.4 90.9  MCH 27.1 26.8 27.6  MCHC 30.4 29.3* 30.4  RDW 14.0 14.0 14.0  PLT 236 229 254    Cardiac EnzymesNo results for input(s): TROPONINI in the last 168 hours. No results  for input(s): TROPIPOC in the last 168 hours.   BNP Recent Labs  Lab 09/28/18 1256  BNP 1,053.0*     DDimer No results for input(s): DDIMER in the last 168 hours.   Radiology    Ct Chest Wo Contrast  Result Date: 09/29/2018 CLINICAL DATA:  Acute respiratory illness. Shortness of breath. Pleural effusion. EXAM: CT CHEST WITHOUT CONTRAST TECHNIQUE: Multidetector CT imaging of the chest was performed following the standard protocol without IV contrast. COMPARISON:  None FINDINGS: Cardiovascular: The heart size is mildly enlarged. Aortic atherosclerosis. Calcification in the LAD, left circumflex and RCA coronary arteries. Mediastinum/Nodes: Normal appearance of the thyroid gland. The trachea appears patent and is midline. Small hiatal hernia. No mediastinal or hilar adenopathy. Lungs/Pleura: Small bilateral pleural effusions identified. There is mild interlobular septal thickening noted  within the lung bases suggesting interstitial edema. Small pulmonary nodule in the left upper lobe measures 2 mm, image 29/4. Within the superior segment of the right lower lobe there is a nodule within knee diameter of 5 mm, image 74/4. Dependent atelectasis noted within both lung bases. No bronchiectasis, interstitial reticulation or honeycombing identified. Upper Abdomen: No acute abnormality. Musculoskeletal: Spondylosis within the thoracic spine. IMPRESSION: 1. Suspect mild congestive heart failure with small bilateral pleural effusions and mild lung base interstitial edema. 2. Pulmonary nodules. No follow-up needed if patient is low-risk (and has no known or suspected primary neoplasm). Non-contrast chest CT can be considered in 12 months if patient is high-risk. This recommendation follows the consensus statement: Guidelines for Management of Incidental Pulmonary Nodules Detected on CT Images: From the Fleischner Society 2017; Radiology 2017; 284:228-243. 3. Aortic Atherosclerosis (ICD10-I70.0). Coronary artery calcifications. Electronically Signed   By: Kerby Moors M.D.   On: 09/29/2018 16:50    Cardiac Studies   Echocardiogram 09/29/2018:  1. The left ventricle has normal systolic function, with an ejection fraction of 55-60%. The cavity size was normal. There is moderately increased left ventricular wall thickness. Left ventricular diastolic Doppler parameters are indeterminate. 2. Left atrial size was severely dilated. 3. The aortic valve is tricuspid. Mild thickening of the aortic valve. Mild calcification of the aortic valve. No stenosis of the aortic valve. Mild aortic annular calcification noted. 4. The mitral valve is abnormal. Moderate thickening of the mitral valve leaflet. Moderate calcification of the mitral valve leaflet. There is moderate mitral annular calcification present. moderate gradient across the mitral valve, mean gradient 40mmHg. The MVA by PHT appears inaccurate,  gradient would support moderate mitral stenosis mitral valve stenosis. 5. The aorta is normal in size and structure. 6. The aortic root is normal in size and structure. 7. Pulmonary hypertension is indeterminate, inadequate TR jet. 8. The interatrial septum was not well visualized.  Patient Profile     85 y.o. female with past medical history of paroxysmal atrial fibrillation (on Eliquis), HTN, HLD, Type 2 DM and prior CVA'swho is being seen today for the evaluation of atrial fibrillation with RVRat the request ofDr. Emokpae.   Assessment & Plan    1.  Paroxysmal/rapid atrial fibrillation: She has reverted back to rapid atrial fibrillation.  Severe left atrial enlargement and moderate mitral stenosis predisposed to high recurrence rate of atrial fibrillation.  She remains on renally dosed Eliquis 2.5 mg twice daily.    I switched her to Cardizem CD 240 mg daily on 09/30/2018.  I will give IV amiodarone 150 mg followed by 200 mg orally twice daily.  She may eventually require AV nodal ablation and permanent  pacemaker. She will follow-up with Dr. Caryl Comes.  2.  Acute diastolic heart failure: Currently on oral Lasix 20 mg daily.  She is hypokalemic so I will start potassium chloride 40 mEq daily.  I will give IV Lasix 40 mg once.  Renal function will need continued monitoring.  3.  Hypertension: Blood pressure has been low normal.  Given need for long-acting diltiazem, losartan may need to be discontinued.  4.  Hyperlipidemia: Continue atorvastatin.  5.  Chronic kidney disease stage III: Renal function has improved but she needs a dose of IV Lasix as she has pulmonary edema due to rapid atrial fibrillation.   For questions or updates, please contact [place] Please consult [email or web address] for contact info under Cardiology/STEMI.      Signed, Kate Sable, MD  10/01/2018, 10:04 AM

## 2018-10-02 ENCOUNTER — Other Ambulatory Visit: Payer: Self-pay | Admitting: Physician Assistant

## 2018-10-02 ENCOUNTER — Inpatient Hospital Stay (HOSPITAL_COMMUNITY): Payer: Medicare HMO

## 2018-10-02 DIAGNOSIS — I509 Heart failure, unspecified: Secondary | ICD-10-CM

## 2018-10-02 DIAGNOSIS — N183 Chronic kidney disease, stage 3 (moderate): Secondary | ICD-10-CM

## 2018-10-02 LAB — BASIC METABOLIC PANEL
Anion gap: 6 (ref 5–15)
BUN: 33 mg/dL — ABNORMAL HIGH (ref 8–23)
CO2: 31 mmol/L (ref 22–32)
Calcium: 9.7 mg/dL (ref 8.9–10.3)
Chloride: 100 mmol/L (ref 98–111)
Creatinine, Ser: 1.42 mg/dL — ABNORMAL HIGH (ref 0.44–1.00)
GFR calc Af Amer: 39 mL/min — ABNORMAL LOW (ref >60)
GFR calc non Af Amer: 34 mL/min — ABNORMAL LOW (ref >60)
Glucose, Bld: 98 mg/dL (ref 70–99)
Potassium: 3.5 mmol/L (ref 3.5–5.1)
Sodium: 137 mmol/L (ref 135–145)

## 2018-10-02 LAB — CBC
HCT: 36.6 % (ref 36.0–46.0)
Hemoglobin: 11 g/dL — ABNORMAL LOW (ref 12.0–15.0)
MCH: 27.2 pg (ref 26.0–34.0)
MCHC: 30.1 g/dL (ref 30.0–36.0)
MCV: 90.4 fL (ref 80.0–100.0)
Platelets: 275 10*3/uL (ref 150–400)
RBC: 4.05 MIL/uL (ref 3.87–5.11)
RDW: 13.7 % (ref 11.5–15.5)
WBC: 11.6 10*3/uL — ABNORMAL HIGH (ref 4.0–10.5)
nRBC: 0 % (ref 0.0–0.2)

## 2018-10-02 LAB — GLUCOSE, CAPILLARY
Glucose-Capillary: 101 mg/dL — ABNORMAL HIGH (ref 70–99)
Glucose-Capillary: 219 mg/dL — ABNORMAL HIGH (ref 70–99)
Glucose-Capillary: 197 mg/dL — ABNORMAL HIGH (ref 70–99)
Glucose-Capillary: 196 mg/dL — ABNORMAL HIGH (ref 70–99)

## 2018-10-02 LAB — MAGNESIUM: Magnesium: 2.1 mg/dL (ref 1.7–2.4)

## 2018-10-02 MED ORDER — FUROSEMIDE 40 MG PO TABS
40.0000 mg | ORAL_TABLET | Freq: Every day | ORAL | Status: DC
  Administered 2018-10-03 – 2018-10-04 (×2): 40 mg via ORAL
  Filled 2018-10-02 (×2): qty 1

## 2018-10-02 MED ORDER — FUROSEMIDE 10 MG/ML IJ SOLN
40.0000 mg | Freq: Once | INTRAMUSCULAR | Status: AC
  Administered 2018-10-02: 40 mg via INTRAVENOUS
  Filled 2018-10-02: qty 4

## 2018-10-02 MED ORDER — METOPROLOL TARTRATE 5 MG/5ML IV SOLN
5.0000 mg | Freq: Once | INTRAVENOUS | Status: AC
  Administered 2018-10-02: 5 mg via INTRAVENOUS
  Filled 2018-10-02: qty 5

## 2018-10-02 NOTE — Progress Notes (Addendum)
Progress Note  Patient Name: Amy Wiggins Date of Encounter: 10/02/2018  Primary Cardiologist: Virl Axe, MD   Subjective   [place] prior to admission, L ankle is better, thinks she hurt it then. Breathing much better than yesterday but not sure she is back to baseline  Inpatient Medications    Scheduled Meds:  amiodarone  200 mg Oral BID   apixaban  2.5 mg Oral BID   atorvastatin  40 mg Oral QPM   citalopram  20 mg Oral Daily   diltiazem  240 mg Oral Daily   furosemide  20 mg Oral Daily   insulin aspart  0-5 Units Subcutaneous QHS   insulin aspart  0-9 Units Subcutaneous TID WC   insulin aspart  3 Units Subcutaneous TID WC   insulin glargine  40 Units Subcutaneous QHS   magnesium oxide  400 mg Oral QPM   multivitamin with minerals  1 tablet Oral QHS   multivitamin-lutein  1 capsule Oral BID   pantoprazole  40 mg Oral Daily   potassium chloride  40 mEq Oral Daily   sodium chloride flush  3 mL Intravenous Q12H   traZODone  100 mg Oral QHS   venlafaxine XR  75 mg Oral Q breakfast   Continuous Infusions:  sodium chloride Stopped (09/30/18 0500)   PRN Meds: sodium chloride, acetaminophen **OR** acetaminophen, albuterol, ondansetron **OR** ondansetron (ZOFRAN) IV, polyethylene glycol, sodium chloride flush   Vital Signs    Vitals:   10/01/18 0523 10/01/18 1500 10/01/18 2058 10/02/18 0539  BP: (!) 108/58 105/67 121/65 131/76  Pulse: (!) 104 (!) 105 (!) 106 96  Resp: 18 18 18 16   Temp: 97.9 F (36.6 C) 98.5 F (36.9 C)  98.1 F (36.7 C)  TempSrc: Oral Oral  Oral  SpO2: 98% 97% 93% 91%  Weight:      Height:        Intake/Output Summary (Last 24 hours) at 10/02/2018 5631 Last data filed at 10/02/2018 0600 Gross per 24 hour  Intake 720 ml  Output 1600 ml  Net -880 ml   Filed Weights   09/28/18 1202 09/28/18 1210  Weight: 90.7 kg 90.7 kg    Telemetry    Spontaneous conversion to ST 08/06 at 12:23, since then, several episodes of brief  Afib, longest one < 1 hr, SR/ST since 2:34 am 08/07 - Personally Reviewed  Physical Exam   General: Well developed, well nourished, female in no acute distress Head: Eyes PERRLA Normocephalic and atraumatic Lungs: Clear bilaterally to auscultation. Heart: HRRR S1 S2, without rub or gallop. No murmur.  Pulses are 2+ & equal. No JVD. Abdomen: Bowel sounds are present, abdomen soft and non-tender without masses or  hernias noted. Msk: Normal strength and tone for age. Extremities: No clubbing, cyanosis or edema.    Skin:  No rashes or lesions noted. Neuro: Alert and oriented X 3. Psych:  Kermit Balo affect, responds appropriately  Labs    Chemistry Recent Labs  Lab 09/30/18 [phone] 10/01/18 0916 10/02/18 0534  NA 139 137 137  K 3.3* 3.3* 3.5  CL 101 101 100  CO2 27 28 31   GLUCOSE 173* 111* 98  BUN 46* 35* 33*  CREATININE 1.99* 1.46* 1.42*  CALCIUM 9.1 9.3 9.7  GFRNONAA 22* 33* 34*  GFRAA 26* 38* 39*  ANIONGAP 11 8 6      Hematology Recent Labs  Lab 09/30/18 0642 10/01/18 0916 10/02/18 0534  WBC 11.0* 11.7* 11.6*  RBC 3.96 4.16 4.05  HGB  10.6* 11.5* 11.0*  HCT 36.2 37.8 36.6  MCV 91.4 90.9 90.4  MCH 26.8 27.6 27.2  MCHC 29.3* 30.4 30.1  RDW 14.0 14.0 13.7  PLT 229 254 275    Cardiac Enzymes High Sensitivity Troponin:   Recent Labs  Lab 09/28/18 1256 09/28/18 1504  TROPONINIHS 30* 26*        BNP Recent Labs  Lab 09/28/18 1256  BNP 1,053.0*     Radiology    Dg Chest Port 1 View  Result Date: 10/02/2018 CLINICAL DATA:  Weakness, hypertension, diabetes mellitus, atrial fibrillation, history stroke EXAM: PORTABLE CHEST 1 VIEW COMPARISON:  Portable exam 0544 hours compared to 09/28/2018 FINDINGS: Enlargement of cardiac silhouette with pulmonary vascular congestion. Atherosclerotic calcification aorta. Loop recorder projects over lower medial LEFT chest. No definite acute infiltrate or pulmonary edema. No pleural effusion or pneumothorax. Bones demineralized.  IMPRESSION: Enlargement of cardiac silhouette with pulmonary vascular congestion. No acute abnormalities. Electronically Signed   By: Lavonia Dana M.D.   On: 10/02/2018 08:31    Cardiac Studies   Echocardiogram 09/29/2018:  1. The left ventricle has normal systolic function, with an ejection fraction of 55-60%. The cavity size was normal. There is moderately increased left ventricular wall thickness. Left ventricular diastolic Doppler parameters are indeterminate. 2. Left atrial size was severely dilated. 3. The aortic valve is tricuspid. Mild thickening of the aortic valve. Mild calcification of the aortic valve. No stenosis of the aortic valve. Mild aortic annular calcification noted. 4. The mitral valve is abnormal. Moderate thickening of the mitral valve leaflet. Moderate calcification of the mitral valve leaflet. There is moderate mitral annular calcification present. moderate gradient across the mitral valve, mean gradient 40mmHg. The MVA by PHT appears inaccurate, gradient would support moderate mitral stenosis mitral valve stenosis. 5. The aorta is normal in size and structure. 6. The aortic root is normal in size and structure. 7. Pulmonary hypertension is indeterminate, inadequate TR jet. 8. The interatrial septum was not well visualized.  Patient Profile     85 y.o. female with past medical history of paroxysmal atrial fibrillation (on Eliquis), HTN, HLD, Type 2 DM and prior CVA's s/p LINQ,who was admitted 08/03 with evaluation of atrial fibrillation with RVR and SOB.   Assessment & Plan    1.  Paroxysmal/rapid atrial fibrillation:  - in/out Afib, severe LAE and mod MS make recurrence virtually guaranteed - continue Eliquis 2.5 mg bid - Cardizem IV>> Cardizem CD 240 mg qd 08/05 - started on amio w/ IV bolus>>po 200 mg bid, will continue this x 2 weeks to complete 10 gm load, then 200 mg qd - f/u with SK, has not seen him since 05/2017, will send message to get her an appt   - prior to this admit, the LINQ had not picked up any atrial fib since 2018  2.  Acute diastolic heart failure:  - s/p IV Lasix >>po 20 mg qd on 08/05 - K+ supplemented, got 60 meq 08/06, on 40 meq qd - give 1 more dose IV Lasix 40 mg today, then increase po rx to 40 mg qd and see if that is enough.  3.  Hypertension:  - on Cardizem, so off losartan  - SBP 100s-130s last 24 hr  4.  Hyperlipidemia:  - on atorvastatin  5.  Chronic kidney disease stage III:  - BUN/Cr increased w/ diuresis, peak 46/1.99>>33/1.42 on 08/06, recheck in am as she got Lasix 40 mg IV on 08/06   For questions or updates,  please contact [place] Please consult [email or web address] for contact info under Cardiology/STEMI.   The patient was seen and examined, and I agree with the history, physical exam, assessment and plan as documented above, with modifications as noted below.  She is doing much better today.  Shortness of breath has considerably improved.  She goes in and out of atrial fibrillation but is in sinus rhythm at the time of my evaluation.  Chest x-ray did show some degree of CHF.  Will give another dose of IV Lasix 40 mg today.  I think she should be discharged on 40 mg of Lasix orally daily.  Continue Cardizem CD 240 mg daily and amiodarone 200 mg twice daily for 2 weeks followed by 200 mg daily.  Anticoagulated with renally dosed Eliquis 2.5 mg twice daily.  She needs close follow-up with Dr. Caryl Comes.   Kate Sable, MD, [facility]  10/02/2018 10:57 AM      Signed, Rosaria Ferries, PA-C  10/02/2018, 9:09 AM

## 2018-10-02 NOTE — Progress Notes (Signed)
PROGRESS NOTE  Amy Wiggins BLT:[id number] DOB: 12/16/1931 DOA: 09/28/2018 PCP: Asencion Noble, MD  Brief History:  85 year old female with history of diabetes mellitus type 2, hypertension, hyperlipidemia, paroxysmal atrial fibrillation, stroke, PFO presenting with 2 to 3-day history of shortness of breath and generalized weakness.  The patient states that she has had the above symptoms since she returned from the grocery store on 09/26/2018.  She states that the symptoms have progressed.  She complain of some subjective fevers and chills but denied any headache, coughing, chest pain, nausea, vomiting, diarrhea, domino pain, dysuria.  Because of continued shortness of breath, she presented for further evaluation.  Patient states that she frequently misses her medications at home, up to 3 days per week.  In the ED, the patient was noted to have heart rate in the 140s.  EKG showed atrial fibrillation with RVR.  She was started on diltiazem drip which has been since converted to oral diltiazem.  Chest x-ray showed pulmonary vascular congestion.  BNP was 1053.  She was on IV furosemide.  Cardiology was consulted to assist with management.  Assessment/Plan: Acute respiratory failure with hypoxia -Secondary to CHF -Presently stable on 2 L nasal cannula -Wean oxygen for saturation 00%  Acute diastolic CHF -76/22/6333 echo EF 55-60%, grade 1 DD, no WMA -IV furosemide discontinued 8/5 and now on oral lasix.  -Monitor I/Os and clinical response -Patient stated that she weighed 202 pounds at home -Repeated echocardiogram noted below.   Atrial fibrillation with RVR -CHADSVASc = 7 -continue apixaban -cardizem 240 mg started by cardiology 09/30/18 -discontinued metoprolol per cardiology service, started amiodarone 8/6 -TSH 0.828 -she continues to go in and out of afib with HR up to 130s, she responded to IV metoprolol 5 mg x1 8/7  Leukocytosis -Suspect stress demargination -UA without  significant pyuria -Discontinue antibiotics monitor clinically -Blood cultures x2 sets--patient had been started on ceftriaxone prior to blood cultures -CT chest: no source of infection reported.  -Chest x-ray without consolidation -discontinued ceftriaxone and monitoring clinically  CKD stage III -Baseline creatinine 1.4-1.7 -Monitor with diuresis  Uncontrolled diabetes mellitus type 2 with hyperglycemia -Continue Lantus -Continue NovoLog sliding scale, added prandial coverage novolog 3 U TIDAC -09/28/2018 hemoglobin A1c 9.3 CBG (last 3)  Recent Labs    10/01/18 2058 10/02/18 0803 10/02/18 1145  GLUCAP 265* 101* 219*   Depression/anxiety -Continue Celexa   Hyperlipidemia -Continue statin  Hypokalemia -this is being repleted. Mg within normal limits.   Disposition Plan:  Continue inpatient treatments Family Communication: Pt updated bedside  Consultants:  cardiology  Code Status:  FULL  DVT Prophylaxis:  apixaban   Procedures: Echocardiogram The left ventricle has normal systolic function, with an ejection fraction of 55-60%. The cavity size was normal. There is moderately increased left ventricular wall thickness. Left ventricular diastolic Doppler parameters are indeterminate.  2. Left atrial size was severely dilated.  3. The aortic valve is tricuspid. Mild thickening of the aortic valve. Mild calcification of the aortic valve. No stenosis of the aortic valve. Mild aortic annular calcification noted.  4. The mitral valve is abnormal. Moderate thickening of the mitral valve leaflet. Moderate calcification of the mitral valve leaflet. There is moderate mitral annular calcification present. moderate gradient across the mitral valve, mean gradient 20mmHg.  The MVA by PHT appears inaccurate, gradient would support moderate mitral stenosis mitral valve stenosis.  5. The aorta is normal in size and structure.  6. The aortic  root is normal in size and structure.  7.  Pulmonary hypertension is indeterminate, inadequate TR jet.  8. The interatrial septum was not well visualized.  Antibiotics: Ceftriaxone 8/3  Subjective: Patient went back into Afib RVR and was given lopressor 5 mg IV.   Objective: Vitals:   10/01/18 2058 10/02/18 0539 10/02/18 0931 10/02/18 1208  BP: 121/65 131/76 (!) 124/57 (!) 127/52  Pulse: (!) 106 96  78  Resp: 18 16  16   Temp:  98.1 F (36.7 C)    TempSrc:  Oral    SpO2: 93% 91%  96%  Weight:      Height:        Intake/Output Summary (Last 24 hours) at 10/02/2018 1524 Last data filed at 10/02/2018 0600 Gross per 24 hour  Intake 480 ml  Output 800 ml  Net -320 ml   Weight change:  Exam:   General:  Pt is alert, follows commands appropriately, not in acute distress, cooperative.   HEENT: No icterus, No thrush, No neck mass, [place]/AT  Cardiovascular: irregular rhythm, elevated rate, normal S1/S2, no rubs, no gallops  Respiratory: Bibasilar crackles but no wheeze  Abdomen: Soft/+BS, non tender, non distended, no guarding  Extremities: No edema, No lymphangitis, No petechiae, No rashes, no synovitis  Data Reviewed: I have personally reviewed following labs and imaging studies Basic Metabolic Panel: Recent Labs  Lab 09/28/18 1256 09/28/18 1505 09/29/18 0633 09/30/18 0642 10/01/18 0916 10/02/18 0534  NA 133*  --  136 139 137 137  K 4.0  --  3.0* 3.3* 3.3* 3.5  CL 96*  --  97* 101 101 100  CO2 26  --  28 27 28 31   GLUCOSE 322*  --  137* 173* 111* 98  BUN 40*  --  40* 46* 35* 33*  CREATININE 1.89*  --  1.60* 1.99* 1.46* 1.42*  CALCIUM 9.2  --  9.4 9.1 9.3 9.7  MG  --  2.0  --  2.3  --  2.1   Liver Function Tests: No results for input(s): AST, ALT, ALKPHOS, BILITOT, PROT, ALBUMIN in the last 168 hours. No results for input(s): LIPASE, AMYLASE in the last 168 hours. No results for input(s): AMMONIA in the last 168 hours. Coagulation Profile: No results for input(s): INR, PROTIME in the last 168  hours. CBC: Recent Labs  Lab 09/28/18 1256 09/29/18 0633 09/30/18 0642 10/01/18 0916 10/02/18 0534  WBC 17.5* 16.3* 11.0* 11.7* 11.6*  NEUTROABS 15.6*  --   --   --   --   HGB 12.0 11.6* 10.6* 11.5* 11.0*  HCT 38.7 38.1 36.2 37.8 36.6  MCV 88.2 89.0 91.4 90.9 90.4  PLT 237 236 229 254 275   Cardiac Enzymes: No results for input(s): CKTOTAL, CKMB, CKMBINDEX, TROPONINI in the last 168 hours. BNP: Invalid input(s): POCBNP CBG: Recent Labs  Lab 10/01/18 1121 10/01/18 1618 10/01/18 2058 10/02/18 0803 10/02/18 1145  GLUCAP 118* 142* 265* 101* 219*   HbA1C: No results for input(s): HGBA1C in the last 72 hours. Urine analysis:    Component Value Date/Time   COLORURINE YELLOW 09/28/2018 1216   APPEARANCEUR HAZY (A) 09/28/2018 1216   LABSPEC 1.007 09/28/2018 1216   PHURINE 5.0 09/28/2018 1216   GLUCOSEU >=500 (A) 09/28/2018 1216   HGBUR NEGATIVE 09/28/2018 1216   BILIRUBINUR NEGATIVE 09/28/2018 1216   KETONESUR NEGATIVE 09/28/2018 1216   PROTEINUR NEGATIVE 09/28/2018 1216   UROBILINOGEN 0.2 12/12/2010 1102   NITRITE NEGATIVE 09/28/2018 1216   LEUKOCYTESUR MODERATE (A)  09/28/2018 1216   Sepsis Labs: @LABRCNTIP (procalcitonin:4,lacticidven:4) ) Recent Results (from the past 240 hour(s))  Urine culture     Status: Abnormal   Collection Time: 09/28/18 12:16 PM   Specimen: Urine, Clean Catch  Result Value Ref Range Status   Specimen Description   Final    URINE, CLEAN CATCH Performed at [facility], [address], [place], [place] [address]    Special Requests   Final    NONE Performed at [facility], [address] [place], [place] [address]    Culture >=100,000 COLONIES/mL ESCHERICHIA COLI (A)  Final   Report Status 09/30/2018 FINAL  Final   Organism ID, Bacteria ESCHERICHIA COLI (A)  Final      Susceptibility   Escherichia coli - MIC*    AMPICILLIN 8 SENSITIVE Sensitive     CEFAZOLIN <=4 SENSITIVE Sensitive     CEFTRIAXONE <=1 SENSITIVE Sensitive      CIPROFLOXACIN <=0.25 SENSITIVE Sensitive     GENTAMICIN <=1 SENSITIVE Sensitive     IMIPENEM <=0.25 SENSITIVE Sensitive     NITROFURANTOIN 32 SENSITIVE Sensitive     TRIMETH/SULFA <=20 SENSITIVE Sensitive     AMPICILLIN/SULBACTAM 4 SENSITIVE Sensitive     PIP/TAZO <=4 SENSITIVE Sensitive     Extended ESBL NEGATIVE Sensitive     * >=100,000 COLONIES/mL ESCHERICHIA COLI  SARS CORONAVIRUS 2 Nasal Swab Aptima Multi Swab     Status: None   Collection Time: 09/28/18 12:17 PM   Specimen: Aptima Multi Swab; Nasal Swab  Result Value Ref Range Status   SARS Coronavirus 2 NEGATIVE NEGATIVE Final    Comment: (NOTE) SARS-CoV-2 target nucleic acids are NOT DETECTED. The SARS-CoV-2 RNA is generally detectable in upper and lower respiratory specimens during the acute phase of infection. Negative results do not preclude SARS-CoV-2 infection, do not rule out co-infections with other pathogens, and should not be used as the sole basis for treatment or other patient management decisions. Negative results must be combined with clinical observations, patient history, and epidemiological information. The expected result is Negative. Fact Sheet for Patients: SugarRoll.be Fact Sheet for Healthcare Providers: https://www.woods-mathews.com/ This test is not yet approved or cleared by the [place] FDA and  has been authorized for detection and/or diagnosis of SARS-CoV-2 by FDA under an Emergency Use Authorization (EUA). This EUA will remain  in effect (meaning this test can be used) for the duration of the COVID-19 declaration under Section 56 4(b)(1) of the Act, 21 U.S.C. section 360bbb-3(b)(1), unless the authorization is terminated or revoked sooner. Performed at [facility], [place] [address] [place], [place] [address]   Culture, blood (Routine X 2) w Reflex to ID Panel     Status: None (Preliminary result)   Collection Time: 09/29/18  6:53 AM    Specimen: BLOOD RIGHT HAND  Result Value Ref Range Status   Specimen Description BLOOD RIGHT HAND  Final   Special Requests   Final    BOTTLES DRAWN AEROBIC AND ANAEROBIC Blood Culture adequate volume   Culture   Final    NO GROWTH 3 DAYS Performed at [facility], [address] [place], [place] [address]    Report Status PENDING  Incomplete  Culture, blood (Routine X 2) w Reflex to ID Panel     Status: None (Preliminary result)   Collection Time: 09/29/18  7:22 AM   Specimen: BLOOD  Result Value Ref Range Status   Specimen Description BLOOD  Final   Special Requests NONE  Final  Culture   Final    NO GROWTH 3 DAYS Performed at [facility], [address] [place], [place] [address]    Report Status PENDING  Incomplete     Scheduled Meds:  amiodarone  200 mg Oral BID   apixaban  2.5 mg Oral BID   atorvastatin  40 mg Oral QPM   citalopram  20 mg Oral Daily   diltiazem  240 mg Oral Daily   [START ON 10/03/2018] furosemide  40 mg Oral Daily   insulin aspart  0-5 Units Subcutaneous QHS   insulin aspart  0-9 Units Subcutaneous TID WC   insulin aspart  3 Units Subcutaneous TID WC   insulin glargine  40 Units Subcutaneous QHS   magnesium oxide  400 mg Oral QPM   multivitamin with minerals  1 tablet Oral QHS   multivitamin-lutein  1 capsule Oral BID   pantoprazole  40 mg Oral Daily   potassium chloride  40 mEq Oral Daily   sodium chloride flush  3 mL Intravenous Q12H   traZODone  100 mg Oral QHS   venlafaxine XR  75 mg Oral Q breakfast   Continuous Infusions:  sodium chloride Stopped (09/30/18 0500)    Procedures/Studies: Ct Chest Wo Contrast  Result Date: 09/29/2018 CLINICAL DATA:  Acute respiratory illness. Shortness of breath. Pleural effusion. EXAM: CT CHEST WITHOUT CONTRAST TECHNIQUE: Multidetector CT imaging of the chest was performed following the standard protocol without IV contrast. COMPARISON:  None FINDINGS: Cardiovascular: The heart size is  mildly enlarged. Aortic atherosclerosis. Calcification in the LAD, left circumflex and RCA coronary arteries. Mediastinum/Nodes: Normal appearance of the thyroid gland. The trachea appears patent and is midline. Small hiatal hernia. No mediastinal or hilar adenopathy. Lungs/Pleura: Small bilateral pleural effusions identified. There is mild interlobular septal thickening noted within the lung bases suggesting interstitial edema. Small pulmonary nodule in the left upper lobe measures 2 mm, image 29/4. Within the superior segment of the right lower lobe there is a nodule within knee diameter of 5 mm, image 74/4. Dependent atelectasis noted within both lung bases. No bronchiectasis, interstitial reticulation or honeycombing identified. Upper Abdomen: No acute abnormality. Musculoskeletal: Spondylosis within the thoracic spine. IMPRESSION: 1. Suspect mild congestive heart failure with small bilateral pleural effusions and mild lung base interstitial edema. 2. Pulmonary nodules. No follow-up needed if patient is low-risk (and has no known or suspected primary neoplasm). Non-contrast chest CT can be considered in 12 months if patient is high-risk. This recommendation follows the consensus statement: Guidelines for Management of Incidental Pulmonary Nodules Detected on CT Images: From the Fleischner Society 2017; Radiology 2017; 284:228-243. 3. Aortic Atherosclerosis (ICD10-I70.0). Coronary artery calcifications. Electronically Signed   By: Kerby Moors M.D.   On: 09/29/2018 16:50   Dg Chest Port 1 View  Result Date: 10/02/2018 CLINICAL DATA:  Weakness, hypertension, diabetes mellitus, atrial fibrillation, history stroke EXAM: PORTABLE CHEST 1 VIEW COMPARISON:  Portable exam 0544 hours compared to 09/28/2018 FINDINGS: Enlargement of cardiac silhouette with pulmonary vascular congestion. Atherosclerotic calcification aorta. Loop recorder projects over lower medial LEFT chest. No definite acute infiltrate or pulmonary  edema. No pleural effusion or pneumothorax. Bones demineralized. IMPRESSION: Enlargement of cardiac silhouette with pulmonary vascular congestion. No acute abnormalities. Electronically Signed   By: Lavonia Dana M.D.   On: 10/02/2018 08:31   Dg Chest Port 1 View  Result Date: 09/28/2018 CLINICAL DATA:  Generalized weakness today. Low blood pressure. History of diabetes and hypertension. EXAM: PORTABLE CHEST 1 VIEW COMPARISON:  Radiographs  02/10/2017 and 01/14/2016. FINDINGS: 1228 hours. Stable cardiomegaly with mild vascular congestion. There is no overt pulmonary edema, confluent airspace opacity, pleural effusion or pneumothorax. Loop recorder overlies the lower left chest. No acute osseous findings are evident. Telemetry leads overlie the chest. IMPRESSION: Cardiomegaly with mild vascular congestion.  No edema. Electronically Signed   By: Richardean Sale M.D.   On: 09/28/2018 12:41    Irwin Brakeman, MD  [facility] How to contact the [facility] Attending or Consulting provider [place] or covering provider during after hours [place], for this patient?  1. Check the care team in [facility] and look for a) attending/consulting [facility] provider listed and b) the [facility] team listed 2. Log into [email or web address] and use [employer]'s universal password to access. If you do not have the password, please contact the hospital operator. 3. Locate the [facility] provider you are looking for under [facility] and page to a number that you can be directly reached. 4. If you still have difficulty reaching the provider, please page the [facility] (Director on Call) for the Hospitalists listed on amion for assistance.   If 7PM-7AM, please contact night-coverage [email or web address] Password [email or web address] 10/02/2018, 3:24 PM   LOS: 3 days

## 2018-10-02 NOTE — Care Management Important Message (Signed)
Important Message  Patient Details  Name: Amy Wiggins MRN: [id number] Date of Birth: 01-18-1932   Medicare Important Message Given:  Yes     [name] 10/02/2018, 1:09 PM

## 2018-10-02 NOTE — Progress Notes (Signed)
Patient sustaining HR in 150's. Patient asymptomatic and is sitting at side of the bed. Vital signs are as follows     10/02/18 1208  Vitals  BP (!) 127/52  BP Location Right Arm  BP Method Automatic  Patient Position (if appropriate) Sitting  Pulse Rate 78  Pulse Rate Source Dinamap  ECG Heart Rate (!) 149  Resp 16  Oxygen Therapy  SpO2 96 %  O2 Device Room Air  Pain Assessment  Pain Scale 0-10  Pain Score 0  MEWS Score  MEWS RR 0  MEWS Pulse 3  MEWS Systolic 0  MEWS LOC 0  MEWS Temp 0  MEWS Score 3  MEWS Score Color Yellow   MD has been informed. Received order for Lopressor 5 mg IV and medication has been administered per order. Will continue to monitor patient.

## 2018-10-03 LAB — GLUCOSE, CAPILLARY
Glucose-Capillary: 85 mg/dL (ref 70–99)
Glucose-Capillary: 238 mg/dL — ABNORMAL HIGH (ref 70–99)
Glucose-Capillary: 121 mg/dL — ABNORMAL HIGH (ref 70–99)
Glucose-Capillary: 274 mg/dL — ABNORMAL HIGH (ref 70–99)

## 2018-10-03 MED ORDER — METOPROLOL TARTRATE 5 MG/5ML IV SOLN
2.5000 mg | Freq: Four times a day (QID) | INTRAVENOUS | Status: DC | PRN
  Administered 2018-10-03: 19:00:00 2.5 mg via INTRAVENOUS
  Filled 2018-10-03: qty 5

## 2018-10-03 MED ORDER — DILTIAZEM HCL ER COATED BEADS 180 MG PO CP24
300.0000 mg | ORAL_CAPSULE | Freq: Every day | ORAL | Status: DC
  Administered 2018-10-03 – 2018-10-06 (×4): 300 mg via ORAL
  Filled 2018-10-03 (×4): qty 1

## 2018-10-03 MED ORDER — DIPHENHYDRAMINE HCL 25 MG PO CAPS
25.0000 mg | ORAL_CAPSULE | Freq: Once | ORAL | Status: AC
  Administered 2018-10-03: 25 mg via ORAL
  Filled 2018-10-03: qty 1

## 2018-10-03 NOTE — Progress Notes (Addendum)
PROGRESS NOTE  Amy Wiggins:[id number] DOB: 10/10/1931 DOA: 09/28/2018 PCP: Asencion Noble, MD  Brief History:  85 year old female with history of diabetes mellitus type 2, hypertension, hyperlipidemia, paroxysmal atrial fibrillation, stroke, PFO presenting with 2 to 3-day history of shortness of breath and generalized weakness.  The patient states that she has had the above symptoms since she returned from the grocery store on 09/26/2018.  She states that the symptoms have progressed.  She complain of some subjective fevers and chills but denied any headache, coughing, chest pain, nausea, vomiting, diarrhea, domino pain, dysuria.  Because of continued shortness of breath, she presented for further evaluation.  Patient states that she frequently misses her medications at home, up to 3 days per week.  In the ED, the patient was noted to have heart rate in the 140s.  EKG showed atrial fibrillation with RVR.  She was started on diltiazem drip which has been since converted to oral diltiazem.  Chest x-ray showed pulmonary vascular congestion.  BNP was 1053.  She was on IV furosemide.  Cardiology was consulted to assist with management.  Assessment/Plan: Acute respiratory failure with hypoxia -Secondary to CHF -Presently stable on 2 L nasal cannula -Wean oxygen for saturation 70%  Acute diastolic CHF -48/88/9169 echo EF 55-60%, grade 1 DD, no WMA -IV furosemide discontinued 8/5 and now on oral lasix.  -Monitor I/Os and clinical response -Patient stated that she weighed 202 pounds at home -Repeated echocardiogram noted below.   Atrial fibrillation with RVR - remains intermittently uncontrolled -CHADSVASc = 7 -continue apixaban -cardizem 240 mg started by cardiology 09/30/18 -discontinued metoprolol per cardiology service, started amiodarone 8/6 -TSH 0.828 -she continues to go in and out of afib with HR up to 170s, she responded to IV metoprolol 5 mg x1 8/7 -Increased cardizem to 300  mg daily, continue amiodarone 200 mg BID.   Leukocytosis -Suspect stress demargination -UA without significant pyuria -Blood cultures x2 sets--patient had been started on ceftriaxone prior to blood cultures -CT chest: no source of infection reported.  -Chest x-ray 10/01/18 without consolidation -discontinued ceftriaxone and monitoring clinically  CKD stage III -Baseline creatinine 1.4-1.7 -Monitor with diuresis  Uncontrolled diabetes mellitus type 2 with hyperglycemia -Continue Lantus -Continue NovoLog sliding scale, added prandial coverage novolog 3 U TIDAC -09/28/2018 hemoglobin A1c 9.3  CBG (last 3)  Recent Labs    10/02/18 1643 10/02/18 2115 10/03/18 0719  GLUCAP 197* 196* 85   Depression/anxiety -Continue Celexa   Hyperlipidemia -Continue statin  Hypokalemia -this is being repleted. Mg within normal limits.   Disposition Plan:  Continue inpatient treatments Family Communication: Pt updated bedside / granddaughter telephone  Consultants:  cardiology  Code Status:  FULL  DVT Prophylaxis:  apixaban   Procedures: Echocardiogram The left ventricle has normal systolic function, with an ejection fraction of 55-60%. The cavity size was normal. There is moderately increased left ventricular wall thickness. Left ventricular diastolic Doppler parameters are indeterminate.  2. Left atrial size was severely dilated.  3. The aortic valve is tricuspid. Mild thickening of the aortic valve. Mild calcification of the aortic valve. No stenosis of the aortic valve. Mild aortic annular calcification noted.  4. The mitral valve is abnormal. Moderate thickening of the mitral valve leaflet. Moderate calcification of the mitral valve leaflet. There is moderate mitral annular calcification present. moderate gradient across the mitral valve, mean gradient 93mmHg.  The MVA by PHT appears inaccurate, gradient would support moderate mitral stenosis  mitral valve stenosis.  5. The aorta is  normal in size and structure.  6. The aortic root is normal in size and structure.  7. Pulmonary hypertension is indeterminate, inadequate TR jet.  8. The interatrial septum was not well visualized.  Antibiotics: Ceftriaxone 8/3  Subjective: Patient went back into Afib RVR HR up to 170, now down to 110.  Pt had SOB during event.  Now feeling better.  Pt has been ambulating to the bathroom.    Objective: Vitals:   10/02/18 2011 10/03/18 0450 10/03/18 0811 10/03/18 0851  BP:  118/80 122/85 122/85  Pulse:  (!) 106 90   Resp:  20 20   Temp:  98.2 F (36.8 C)    TempSrc:  Oral    SpO2: 95% 93% 95%   Weight:      Height:        Intake/Output Summary (Last 24 hours) at 10/03/2018 1031 Last data filed at 10/02/2018 1700 Gross per 24 hour  Intake 480 ml  Output 600 ml  Net -120 ml   Weight change:  Exam:   General:  Pt is alert, follows commands appropriately, not in acute distress, cooperative.   HEENT: No icterus, No thrush, No neck mass, [place]/AT  Cardiovascular: irregular rhythm, elevated rate, normal S1/S2, no rubs, no gallops  Respiratory: Bibasilar crackles but no wheeze  Abdomen: Soft/+BS, non tender, non distended, no guarding  Extremities: No edema, No lymphangitis, No petechiae, No rashes, no synovitis  Data Reviewed: I have personally reviewed following labs and imaging studies Basic Metabolic Panel: Recent Labs  Lab 09/28/18 1256 09/28/18 1505 09/29/18 0633 09/30/18 0642 10/01/18 0916 10/02/18 0534  NA 133*  --  136 139 137 137  K 4.0  --  3.0* 3.3* 3.3* 3.5  CL 96*  --  97* 101 101 100  CO2 26  --  28 27 28 31   GLUCOSE 322*  --  137* 173* 111* 98  BUN 40*  --  40* 46* 35* 33*  CREATININE 1.89*  --  1.60* 1.99* 1.46* 1.42*  CALCIUM 9.2  --  9.4 9.1 9.3 9.7  MG  --  2.0  --  2.3  --  2.1   Liver Function Tests: No results for input(s): AST, ALT, ALKPHOS, BILITOT, PROT, ALBUMIN in the last 168 hours. No results for input(s): LIPASE, AMYLASE in the  last 168 hours. No results for input(s): AMMONIA in the last 168 hours. Coagulation Profile: No results for input(s): INR, PROTIME in the last 168 hours. CBC: Recent Labs  Lab 09/28/18 1256 09/29/18 0633 09/30/18 0642 10/01/18 0916 10/02/18 0534  WBC 17.5* 16.3* 11.0* 11.7* 11.6*  NEUTROABS 15.6*  --   --   --   --   HGB 12.0 11.6* 10.6* 11.5* 11.0*  HCT 38.7 38.1 36.2 37.8 36.6  MCV 88.2 89.0 91.4 90.9 90.4  PLT 237 236 229 254 275   Cardiac Enzymes: No results for input(s): CKTOTAL, CKMB, CKMBINDEX, TROPONINI in the last 168 hours. BNP: Invalid input(s): POCBNP CBG: Recent Labs  Lab 10/02/18 0803 10/02/18 1145 10/02/18 1643 10/02/18 2115 10/03/18 0719  GLUCAP 101* 219* 197* 196* 85   HbA1C: No results for input(s): HGBA1C in the last 72 hours. Urine analysis:    Component Value Date/Time   COLORURINE YELLOW 09/28/2018 1216   APPEARANCEUR HAZY (A) 09/28/2018 1216   LABSPEC 1.007 09/28/2018 1216   PHURINE 5.0 09/28/2018 1216   GLUCOSEU >=500 (A) 09/28/2018 1216   HGBUR NEGATIVE 09/28/2018 1216  BILIRUBINUR NEGATIVE 09/28/2018 [place] 09/28/2018 1216   PROTEINUR NEGATIVE 09/28/2018 1216   UROBILINOGEN 0.2 12/12/2010 1102   NITRITE NEGATIVE 09/28/2018 1216   LEUKOCYTESUR MODERATE (A) 09/28/2018 1216   Sepsis Labs: @LABRCNTIP (procalcitonin:4,lacticidven:4) ) Recent Results (from the past 240 hour(s))  Urine culture     Status: Abnormal   Collection Time: 09/28/18 12:16 PM   Specimen: Urine, Clean Catch  Result Value Ref Range Status   Specimen Description   Final    URINE, CLEAN CATCH Performed at [facility], [address] [place], [place] [address]    Special Requests   Final    NONE Performed at [facility], [address], [place], [place] [address]    Culture >=100,000 COLONIES/mL ESCHERICHIA COLI (A)  Final   Report Status 09/30/2018 FINAL  Final   Organism ID, Bacteria ESCHERICHIA COLI (A)  Final      Susceptibility    Escherichia coli - MIC*    AMPICILLIN 8 SENSITIVE Sensitive     CEFAZOLIN <=4 SENSITIVE Sensitive     CEFTRIAXONE <=1 SENSITIVE Sensitive     CIPROFLOXACIN <=0.25 SENSITIVE Sensitive     GENTAMICIN <=1 SENSITIVE Sensitive     IMIPENEM <=0.25 SENSITIVE Sensitive     NITROFURANTOIN 32 SENSITIVE Sensitive     TRIMETH/SULFA <=20 SENSITIVE Sensitive     AMPICILLIN/SULBACTAM 4 SENSITIVE Sensitive     PIP/TAZO <=4 SENSITIVE Sensitive     Extended ESBL NEGATIVE Sensitive     * >=100,000 COLONIES/mL ESCHERICHIA COLI  SARS CORONAVIRUS 2 Nasal Swab Aptima Multi Swab     Status: None   Collection Time: 09/28/18 12:17 PM   Specimen: Aptima Multi Swab; Nasal Swab  Result Value Ref Range Status   SARS Coronavirus 2 NEGATIVE NEGATIVE Final    Comment: (NOTE) SARS-CoV-2 target nucleic acids are NOT DETECTED. The SARS-CoV-2 RNA is generally detectable in upper and lower respiratory specimens during the acute phase of infection. Negative results do not preclude SARS-CoV-2 infection, do not rule out co-infections with other pathogens, and should not be used as the sole basis for treatment or other patient management decisions. Negative results must be combined with clinical observations, patient history, and epidemiological information. The expected result is Negative. Fact Sheet for Patients: SugarRoll.be Fact Sheet for Healthcare Providers: https://www.woods-mathews.com/ This test is not yet approved or cleared by the [place] FDA and  has been authorized for detection and/or diagnosis of SARS-CoV-2 by FDA under an Emergency Use Authorization (EUA). This EUA will remain  in effect (meaning this test can be used) for the duration of the COVID-19 declaration under Section 56 4(b)(1) of the Act, 21 U.S.C. section 360bbb-3(b)(1), unless the authorization is terminated or revoked sooner. Performed at [facility], [place] [address] [place],  [place] [address]   Culture, blood (Routine X 2) w Reflex to ID Panel     Status: None (Preliminary result)   Collection Time: 09/29/18  6:53 AM   Specimen: BLOOD RIGHT HAND  Result Value Ref Range Status   Specimen Description BLOOD RIGHT HAND  Final   Special Requests   Final    BOTTLES DRAWN AEROBIC AND ANAEROBIC Blood Culture adequate volume   Culture   Final    NO GROWTH 4 DAYS Performed at [facility], [address] [place], [place] [address]    Report Status PENDING  Incomplete  Culture, blood (Routine X 2) w Reflex to ID Panel     Status: None (Preliminary result)  Collection Time: 09/29/18  7:22 AM   Specimen: BLOOD  Result Value Ref Range Status   Specimen Description BLOOD  Final   Special Requests NONE  Final   Culture   Final    NO GROWTH 4 DAYS Performed at [facility], [address] [place], [place] [address]    Report Status PENDING  Incomplete     Scheduled Meds:  amiodarone  200 mg Oral BID   apixaban  2.5 mg Oral BID   atorvastatin  40 mg Oral QPM   citalopram  20 mg Oral Daily   diltiazem  300 mg Oral Daily   furosemide  40 mg Oral Daily   insulin aspart  0-5 Units Subcutaneous QHS   insulin aspart  0-9 Units Subcutaneous TID WC   insulin aspart  3 Units Subcutaneous TID WC   insulin glargine  40 Units Subcutaneous QHS   magnesium oxide  400 mg Oral QPM   multivitamin with minerals  1 tablet Oral QHS   multivitamin-lutein  1 capsule Oral BID   pantoprazole  40 mg Oral Daily   potassium chloride  40 mEq Oral Daily   sodium chloride flush  3 mL Intravenous Q12H   traZODone  100 mg Oral QHS   venlafaxine XR  75 mg Oral Q breakfast   Continuous Infusions:  sodium chloride Stopped (09/30/18 0500)    Procedures/Studies: Ct Chest Wo Contrast  Result Date: 09/29/2018 CLINICAL DATA:  Acute respiratory illness. Shortness of breath. Pleural effusion. EXAM: CT CHEST WITHOUT CONTRAST TECHNIQUE: Multidetector CT imaging of the chest was  performed following the standard protocol without IV contrast. COMPARISON:  None FINDINGS: Cardiovascular: The heart size is mildly enlarged. Aortic atherosclerosis. Calcification in the LAD, left circumflex and RCA coronary arteries. Mediastinum/Nodes: Normal appearance of the thyroid gland. The trachea appears patent and is midline. Small hiatal hernia. No mediastinal or hilar adenopathy. Lungs/Pleura: Small bilateral pleural effusions identified. There is mild interlobular septal thickening noted within the lung bases suggesting interstitial edema. Small pulmonary nodule in the left upper lobe measures 2 mm, image 29/4. Within the superior segment of the right lower lobe there is a nodule within knee diameter of 5 mm, image 74/4. Dependent atelectasis noted within both lung bases. No bronchiectasis, interstitial reticulation or honeycombing identified. Upper Abdomen: No acute abnormality. Musculoskeletal: Spondylosis within the thoracic spine. IMPRESSION: 1. Suspect mild congestive heart failure with small bilateral pleural effusions and mild lung base interstitial edema. 2. Pulmonary nodules. No follow-up needed if patient is low-risk (and has no known or suspected primary neoplasm). Non-contrast chest CT can be considered in 12 months if patient is high-risk. This recommendation follows the consensus statement: Guidelines for Management of Incidental Pulmonary Nodules Detected on CT Images: From the Fleischner Society 2017; Radiology 2017; 284:228-243. 3. Aortic Atherosclerosis (ICD10-I70.0). Coronary artery calcifications. Electronically Signed   By: Kerby Moors M.D.   On: 09/29/2018 16:50   Dg Chest Port 1 View  Result Date: 10/02/2018 CLINICAL DATA:  Weakness, hypertension, diabetes mellitus, atrial fibrillation, history stroke EXAM: PORTABLE CHEST 1 VIEW COMPARISON:  Portable exam 0544 hours compared to 09/28/2018 FINDINGS: Enlargement of cardiac silhouette with pulmonary vascular congestion.  Atherosclerotic calcification aorta. Loop recorder projects over lower medial LEFT chest. No definite acute infiltrate or pulmonary edema. No pleural effusion or pneumothorax. Bones demineralized. IMPRESSION: Enlargement of cardiac silhouette with pulmonary vascular congestion. No acute abnormalities. Electronically Signed   By: Lavonia Dana M.D.   On: 10/02/2018 08:31   Dg Chest  Port 1 View  Result Date: 09/28/2018 CLINICAL DATA:  Generalized weakness today. Low blood pressure. History of diabetes and hypertension. EXAM: PORTABLE CHEST 1 VIEW COMPARISON:  Radiographs 02/10/2017 and 01/14/2016. FINDINGS: 1228 hours. Stable cardiomegaly with mild vascular congestion. There is no overt pulmonary edema, confluent airspace opacity, pleural effusion or pneumothorax. Loop recorder overlies the lower left chest. No acute osseous findings are evident. Telemetry leads overlie the chest. IMPRESSION: Cardiomegaly with mild vascular congestion.  No edema. Electronically Signed   By: Richardean Sale M.D.   On: 09/28/2018 12:41    Irwin Brakeman, MD  [facility] How to contact the [facility] Attending or Consulting provider [place] or covering provider during after hours [place], for this patient?  1. Check the care team in [facility] and look for a) attending/consulting [facility] provider listed and b) the [facility] team listed 2. Log into [email or web address] and use [place]'s universal password to access. If you do not have the password, please contact the hospital operator. 3. Locate the [facility] provider you are looking for under [facility] and page to a number that you can be directly reached. 4. If you still have difficulty reaching the provider, please page the [facility] (Director on Call) for the Hospitalists listed on amion for assistance.   If 7PM-7AM, please contact night-coverage [email or web address] Password [email or web address] 10/03/2018, 10:31 AM   LOS: 4 days

## 2018-10-03 NOTE — Progress Notes (Signed)
Per Dr. Wynetta Emery: No new orders- increased Cardizem previous to message.

## 2018-10-03 NOTE — Progress Notes (Signed)
Telemetry called: pt in A-Fib with rate has high as 170. Currently rate in the 70's to 110. VS: B/P 125/85, RR 20 even and unlabored, Pulse ox 95% on RA. Denies pain/discomfort. States Amy Wiggins does not feel well-nonspecific-did not sleep last night. Stable. Epic message sent to Dr. Wynetta Emery. Continue to monitor

## 2018-10-03 NOTE — Progress Notes (Signed)
Telemetry called: HR 140. Pt OOB in chair. Stated she can feel faster HR and sl SOB. Applied O2 at 2L via [place]. VS: B/P 131/67, RR 20 even and unlabored, O2 sat at  97% on 2L via [place]. Assisted back to bed. PRN Lopressor given as ordered. HR in the teens. No c/o pain or discomfort. No distress. Continue to monitor.

## 2018-10-04 LAB — BASIC METABOLIC PANEL
Anion gap: 11 (ref 5–15)
BUN: 35 mg/dL — ABNORMAL HIGH (ref 8–23)
CO2: 33 mmol/L — ABNORMAL HIGH (ref 22–32)
Calcium: 10.2 mg/dL (ref 8.9–10.3)
Chloride: 97 mmol/L — ABNORMAL LOW (ref 98–111)
Creatinine, Ser: 1.62 mg/dL — ABNORMAL HIGH (ref 0.44–1.00)
GFR calc Af Amer: 33 mL/min — ABNORMAL LOW (ref >60)
GFR calc non Af Amer: 29 mL/min — ABNORMAL LOW (ref >60)
Glucose, Bld: 93 mg/dL (ref 70–99)
Potassium: 3.7 mmol/L (ref 3.5–5.1)
Sodium: 141 mmol/L (ref 135–145)

## 2018-10-04 LAB — CULTURE, BLOOD (ROUTINE X 2)
Culture: NO GROWTH
Culture: NO GROWTH
Special Requests: ADEQUATE

## 2018-10-04 LAB — GLUCOSE, CAPILLARY
Glucose-Capillary: 93 mg/dL (ref 70–99)
Glucose-Capillary: 210 mg/dL — ABNORMAL HIGH (ref 70–99)
Glucose-Capillary: 222 mg/dL — ABNORMAL HIGH (ref 70–99)
Glucose-Capillary: 172 mg/dL — ABNORMAL HIGH (ref 70–99)

## 2018-10-04 MED ORDER — METOPROLOL TARTRATE 5 MG/5ML IV SOLN
5.0000 mg | Freq: Four times a day (QID) | INTRAVENOUS | Status: DC | PRN
  Administered 2018-10-05: 5 mg via INTRAVENOUS
  Filled 2018-10-04: qty 5

## 2018-10-04 MED ORDER — INSULIN ASPART 100 UNIT/ML ~~LOC~~ SOLN
4.0000 [IU] | Freq: Three times a day (TID) | SUBCUTANEOUS | Status: DC
  Administered 2018-10-04 – 2018-10-05 (×3): 4 [IU] via SUBCUTANEOUS

## 2018-10-04 NOTE — Progress Notes (Signed)
PROGRESS NOTE  Amy Wiggins DDU:[id number] DOB: 1932/01/29 DOA: 09/28/2018 PCP: Asencion Noble, MD  Brief History:  85 year old female with history of diabetes mellitus type 2, hypertension, hyperlipidemia, paroxysmal atrial fibrillation, stroke, PFO presenting with 2 to 3-day history of shortness of breath and generalized weakness.  The patient states that she has had the above symptoms since she returned from the grocery store on 09/26/2018.  She states that the symptoms have progressed.  She complain of some subjective fevers and chills but denied any headache, coughing, chest pain, nausea, vomiting, diarrhea, domino pain, dysuria.  Because of continued shortness of breath, she presented for further evaluation.  Patient states that she frequently misses her medications at home, up to 3 days per week.  In the ED, the patient was noted to have heart rate in the 140s.  EKG showed atrial fibrillation with RVR.  She was started on diltiazem drip which has been since converted to oral diltiazem.  Chest x-ray showed pulmonary vascular congestion.  BNP was 1053.  She was on IV furosemide.  Cardiology was consulted to assist with management.  Assessment/Plan: Acute respiratory failure with hypoxia -Secondary to CHF -Presently stable on 2 L nasal cannula -Wean oxygen for saturation 23%  Acute diastolic CHF -76/28/3151 echo EF 55-60%, grade 1 DD, no WMA -IV furosemide discontinued 8/5 and now on oral lasix.  -Monitor I/Os and clinical response -Patient stated that she weighed 202 pounds at home -Repeated echocardiogram noted below.   Atrial fibrillation with RVR - remains intermittently uncontrolled -CHADSVASc = 7 -continue apixaban -cardizem 240 mg started by cardiology 09/30/18 -discontinued metoprolol per cardiology service, started amiodarone 8/6 -TSH 0.828 -she continues to go in and out of afib with HR up to 170s, she responded to IV metoprolol -Increased cardizem to 300 mg daily  8/8, continue amiodarone 200 mg BID.   Leukocytosis -Suspect stress demargination -UA without significant pyuria -Blood cultures x2 sets--patient had been started on ceftriaxone prior to blood cultures -CT chest: no source of infection reported.  -Chest x-ray 10/01/18 without consolidation -discontinued ceftriaxone and monitoring clinically  CKD stage III -Baseline creatinine 1.4-1.7 -Monitor with diuresis  Uncontrolled diabetes mellitus type 2 with hyperglycemia -Continue Lantus -Continue NovoLog sliding scale, increased prandial coverage novolog 4 U TIDAC -09/28/2018 hemoglobin A1c 9.3  CBG (last 3)  Recent Labs    10/03/18 2019 10/04/18 0730 10/04/18 1131  GLUCAP 274* 93 210*   Depression/anxiety -Continue Celexa   Hyperlipidemia -Continue statin  Hypokalemia -this is being repleted. Mg within normal limits.   Disposition Plan:  Continue inpatient treatments Family Communication: Pt updated bedside / granddaughter telephone  Consultants:  cardiology  Code Status:  FULL  DVT Prophylaxis:  apixaban   Procedures: Echocardiogram The left ventricle has normal systolic function, with an ejection fraction of 55-60%. The cavity size was normal. There is moderately increased left ventricular wall thickness. Left ventricular diastolic Doppler parameters are indeterminate.  2. Left atrial size was severely dilated.  3. The aortic valve is tricuspid. Mild thickening of the aortic valve. Mild calcification of the aortic valve. No stenosis of the aortic valve. Mild aortic annular calcification noted.  4. The mitral valve is abnormal. Moderate thickening of the mitral valve leaflet. Moderate calcification of the mitral valve leaflet. There is moderate mitral annular calcification present. moderate gradient across the mitral valve, mean gradient 42mmHg.  The MVA by PHT appears inaccurate, gradient would support moderate mitral stenosis mitral valve stenosis.  5. The aorta is normal  in size and structure.  6. The aortic root is normal in size and structure.  7. Pulmonary hypertension is indeterminate, inadequate TR jet.  8. The interatrial septum was not well visualized.  Antibiotics: Ceftriaxone 8/3  Subjective: Patient continues to go back into Afib RVR HR up to 170, now back down to 110 after IV lopressor.  Pt had SOB during event.  Now feeling better.  Pt has been ambulating to the bathroom.    Objective: Vitals:   10/03/18 2102 10/03/18 2135 10/04/18 0450 10/04/18 1018  BP:  100/62 113/72   Pulse:  (!) 110 (!) 109   Resp:  20    Temp:  97.9 F (36.6 C) 97.7 F (36.5 C)   TempSrc:  Oral Oral   SpO2: 93% 97% 91% 94%  Weight:      Height:        Intake/Output Summary (Last 24 hours) at 10/04/2018 1153 Last data filed at 10/03/2018 1700 Gross per 24 hour  Intake 240 ml  Output --  Net 240 ml   Weight change:  Exam:   General:  Pt is alert, follows commands appropriately, not in acute distress, cooperative.   HEENT: No icterus, No thrush, No neck mass, [place]/AT  Cardiovascular: irregular rhythm, rate 110, normal S1/S2, no rubs, no gallops  Respiratory: Bibasilar crackles but no wheeze  Abdomen: Soft/+BS, non tender, non distended, no guarding  Extremities: No edema, No lymphangitis, No petechiae, No rashes, no synovitis  Data Reviewed: I have personally reviewed following labs and imaging studies Basic Metabolic Panel: Recent Labs  Lab 09/28/18 1505 09/29/18 0633 09/30/18 0642 10/01/18 0916 10/02/18 0534 10/04/18 0632  NA  --  136 139 137 137 141  K  --  3.0* 3.3* 3.3* 3.5 3.7  CL  --  97* 101 101 100 97*  CO2  --  28 27 28 31  33*  GLUCOSE  --  137* 173* 111* 98 93  BUN  --  40* 46* 35* 33* 35*  CREATININE  --  1.60* 1.99* 1.46* 1.42* 1.62*  CALCIUM  --  9.4 9.1 9.3 9.7 10.2  MG 2.0  --  2.3  --  2.1  --    Liver Function Tests: No results for input(s): AST, ALT, ALKPHOS, BILITOT, PROT, ALBUMIN in the last 168 hours. No results  for input(s): LIPASE, AMYLASE in the last 168 hours. No results for input(s): AMMONIA in the last 168 hours. Coagulation Profile: No results for input(s): INR, PROTIME in the last 168 hours. CBC: Recent Labs  Lab 09/28/18 1256 09/29/18 0633 09/30/18 0642 10/01/18 0916 10/02/18 0534  WBC 17.5* 16.3* 11.0* 11.7* 11.6*  NEUTROABS 15.6*  --   --   --   --   HGB 12.0 11.6* 10.6* 11.5* 11.0*  HCT 38.7 38.1 36.2 37.8 36.6  MCV 88.2 89.0 91.4 90.9 90.4  PLT 237 236 229 254 275   Cardiac Enzymes: No results for input(s): CKTOTAL, CKMB, CKMBINDEX, TROPONINI in the last 168 hours. BNP: Invalid input(s): POCBNP CBG: Recent Labs  Lab 10/03/18 1124 10/03/18 1559 10/03/18 2019 10/04/18 0730 10/04/18 1131  GLUCAP 238* 121* 274* 93 210*   HbA1C: No results for input(s): HGBA1C in the last 72 hours. Urine analysis:    Component Value Date/Time   COLORURINE YELLOW 09/28/2018 1216   APPEARANCEUR HAZY (A) 09/28/2018 1216   LABSPEC 1.007 09/28/2018 1216   PHURINE 5.0 09/28/2018 1216   GLUCOSEU >=500 (A) 09/28/2018 1216  HGBUR NEGATIVE 09/28/2018 [place] 09/28/2018 1216   KETONESUR NEGATIVE 09/28/2018 1216   PROTEINUR NEGATIVE 09/28/2018 1216   UROBILINOGEN 0.2 12/12/2010 1102   NITRITE NEGATIVE 09/28/2018 1216   LEUKOCYTESUR MODERATE (A) 09/28/2018 1216   Sepsis Labs: @LABRCNTIP (procalcitonin:4,lacticidven:4) ) Recent Results (from the past 240 hour(s))  Urine culture     Status: Abnormal   Collection Time: 09/28/18 12:16 PM   Specimen: Urine, Clean Catch  Result Value Ref Range Status   Specimen Description   Final    URINE, CLEAN CATCH Performed at [facility], [address], [place], [place] [address]    Special Requests   Final    NONE Performed at [facility], [address], [place], [place] [address]    Culture >=100,000 COLONIES/mL ESCHERICHIA COLI (A)  Final   Report Status 09/30/2018 FINAL  Final   Organism ID, Bacteria ESCHERICHIA COLI  (A)  Final      Susceptibility   Escherichia coli - MIC*    AMPICILLIN 8 SENSITIVE Sensitive     CEFAZOLIN <=4 SENSITIVE Sensitive     CEFTRIAXONE <=1 SENSITIVE Sensitive     CIPROFLOXACIN <=0.25 SENSITIVE Sensitive     GENTAMICIN <=1 SENSITIVE Sensitive     IMIPENEM <=0.25 SENSITIVE Sensitive     NITROFURANTOIN 32 SENSITIVE Sensitive     TRIMETH/SULFA <=20 SENSITIVE Sensitive     AMPICILLIN/SULBACTAM 4 SENSITIVE Sensitive     PIP/TAZO <=4 SENSITIVE Sensitive     Extended ESBL NEGATIVE Sensitive     * >=100,000 COLONIES/mL ESCHERICHIA COLI  SARS CORONAVIRUS 2 Nasal Swab Aptima Multi Swab     Status: None   Collection Time: 09/28/18 12:17 PM   Specimen: Aptima Multi Swab; Nasal Swab  Result Value Ref Range Status   SARS Coronavirus 2 NEGATIVE NEGATIVE Final    Comment: (NOTE) SARS-CoV-2 target nucleic acids are NOT DETECTED. The SARS-CoV-2 RNA is generally detectable in upper and lower respiratory specimens during the acute phase of infection. Negative results do not preclude SARS-CoV-2 infection, do not rule out co-infections with other pathogens, and should not be used as the sole basis for treatment or other patient management decisions. Negative results must be combined with clinical observations, patient history, and epidemiological information. The expected result is Negative. Fact Sheet for Patients: SugarRoll.be Fact Sheet for Healthcare Providers: https://www.woods-mathews.com/ This test is not yet approved or cleared by the [place] FDA and  has been authorized for detection and/or diagnosis of SARS-CoV-2 by FDA under an Emergency Use Authorization (EUA). This EUA will remain  in effect (meaning this test can be used) for the duration of the COVID-19 declaration under Section 56 4(b)(1) of the Act, 21 U.S.C. section 360bbb-3(b)(1), unless the authorization is terminated or revoked sooner. Performed at [facility], [place] [address], [place], [place] [address]   Culture, blood (Routine X 2) w Reflex to ID Panel     Status: None   Collection Time: 09/29/18  6:53 AM   Specimen: BLOOD RIGHT HAND  Result Value Ref Range Status   Specimen Description BLOOD RIGHT HAND  Final   Special Requests   Final    BOTTLES DRAWN AEROBIC AND ANAEROBIC Blood Culture adequate volume   Culture   Final    NO GROWTH 5 DAYS Performed at [facility], [address], [place],  [address]    Report Status 10/04/2018 FINAL  Final  Culture, blood (Routine X 2) w Reflex to ID Panel  Status: None   Collection Time: 09/29/18  7:22 AM   Specimen: BLOOD  Result Value Ref Range Status   Specimen Description BLOOD  Final   Special Requests NONE  Final   Culture   Final    NO GROWTH 5 DAYS Performed at [facility], [address], [place], [place] [address]    Report Status 10/04/2018 FINAL  Final     Scheduled Meds:  amiodarone  200 mg Oral BID   apixaban  2.5 mg Oral BID   atorvastatin  40 mg Oral QPM   citalopram  20 mg Oral Daily   diltiazem  300 mg Oral Daily   furosemide  40 mg Oral Daily   insulin aspart  0-5 Units Subcutaneous QHS   insulin aspart  0-9 Units Subcutaneous TID WC   insulin aspart  4 Units Subcutaneous TID WC   insulin glargine  40 Units Subcutaneous QHS   magnesium oxide  400 mg Oral QPM   multivitamin with minerals  1 tablet Oral QHS   multivitamin-lutein  1 capsule Oral BID   pantoprazole  40 mg Oral Daily   potassium chloride  40 mEq Oral Daily   sodium chloride flush  3 mL Intravenous Q12H   traZODone  100 mg Oral QHS   venlafaxine XR  75 mg Oral Q breakfast   Continuous Infusions:  sodium chloride Stopped (09/30/18 0500)    Procedures/Studies: Ct Chest Wo Contrast  Result Date: 09/29/2018 CLINICAL DATA:  Acute respiratory illness. Shortness of breath. Pleural effusion. EXAM: CT CHEST WITHOUT CONTRAST TECHNIQUE: Multidetector CT imaging of the chest was  performed following the standard protocol without IV contrast. COMPARISON:  None FINDINGS: Cardiovascular: The heart size is mildly enlarged. Aortic atherosclerosis. Calcification in the LAD, left circumflex and RCA coronary arteries. Mediastinum/Nodes: Normal appearance of the thyroid gland. The trachea appears patent and is midline. Small hiatal hernia. No mediastinal or hilar adenopathy. Lungs/Pleura: Small bilateral pleural effusions identified. There is mild interlobular septal thickening noted within the lung bases suggesting interstitial edema. Small pulmonary nodule in the left upper lobe measures 2 mm, image 29/4. Within the superior segment of the right lower lobe there is a nodule within knee diameter of 5 mm, image 74/4. Dependent atelectasis noted within both lung bases. No bronchiectasis, interstitial reticulation or honeycombing identified. Upper Abdomen: No acute abnormality. Musculoskeletal: Spondylosis within the thoracic spine. IMPRESSION: 1. Suspect mild congestive heart failure with small bilateral pleural effusions and mild lung base interstitial edema. 2. Pulmonary nodules. No follow-up needed if patient is low-risk (and has no known or suspected primary neoplasm). Non-contrast chest CT can be considered in 12 months if patient is high-risk. This recommendation follows the consensus statement: Guidelines for Management of Incidental Pulmonary Nodules Detected on CT Images: From the Fleischner Society 2017; Radiology 2017; 284:228-243. 3. Aortic Atherosclerosis (ICD10-I70.0). Coronary artery calcifications. Electronically Signed   By: Kerby Moors M.D.   On: 09/29/2018 16:50   Dg Chest Port 1 View  Result Date: 10/02/2018 CLINICAL DATA:  Weakness, hypertension, diabetes mellitus, atrial fibrillation, history stroke EXAM: PORTABLE CHEST 1 VIEW COMPARISON:  Portable exam 0544 hours compared to 09/28/2018 FINDINGS: Enlargement of cardiac silhouette with pulmonary vascular congestion.  Atherosclerotic calcification aorta. Loop recorder projects over lower medial LEFT chest. No definite acute infiltrate or pulmonary edema. No pleural effusion or pneumothorax. Bones demineralized. IMPRESSION: Enlargement of cardiac silhouette with pulmonary vascular congestion. No acute abnormalities. Electronically Signed   By: Lavonia Dana M.D.   On: 10/02/2018  08:31   Dg Chest Port 1 View  Result Date: 09/28/2018 CLINICAL DATA:  Generalized weakness today. Low blood pressure. History of diabetes and hypertension. EXAM: PORTABLE CHEST 1 VIEW COMPARISON:  Radiographs 02/10/2017 and 01/14/2016. FINDINGS: 1228 hours. Stable cardiomegaly with mild vascular congestion. There is no overt pulmonary edema, confluent airspace opacity, pleural effusion or pneumothorax. Loop recorder overlies the lower left chest. No acute osseous findings are evident. Telemetry leads overlie the chest. IMPRESSION: Cardiomegaly with mild vascular congestion.  No edema. Electronically Signed   By: Richardean Sale M.D.   On: 09/28/2018 12:41    Irwin Brakeman, MD  [facility] How to contact the [facility] Attending or Consulting provider [place] or covering provider during after hours [place], for this patient?  1. Check the care team in [facility] and look for a) attending/consulting [facility] provider listed and b) the [facility] team listed 2. Log into [email or web address] and use [place]'s universal password to access. If you do not have the password, please contact the hospital operator. 3. Locate the [facility] provider you are looking for under [facility] and page to a number that you can be directly reached. 4. If you still have difficulty reaching the provider, please page the [facility] (Director on Call) for the Hospitalists listed on amion for assistance.   If 7PM-7AM, please contact night-coverage [email or web address] Password [email or web address] 10/04/2018, 11:53 AM   LOS: 5 days

## 2018-10-05 LAB — GLUCOSE, CAPILLARY
Glucose-Capillary: 186 mg/dL — ABNORMAL HIGH (ref 70–99)
Glucose-Capillary: 220 mg/dL — ABNORMAL HIGH (ref 70–99)
Glucose-Capillary: 112 mg/dL — ABNORMAL HIGH (ref 70–99)
Glucose-Capillary: 137 mg/dL — ABNORMAL HIGH (ref 70–99)

## 2018-10-05 LAB — CBC
HCT: 35.8 % — ABNORMAL LOW (ref 36.0–46.0)
Hemoglobin: 10.8 g/dL — ABNORMAL LOW (ref 12.0–15.0)
MCH: 27.4 pg (ref 26.0–34.0)
MCHC: 30.2 g/dL (ref 30.0–36.0)
MCV: 90.9 fL (ref 80.0–100.0)
Platelets: 304 10*3/uL (ref 150–400)
RBC: 3.94 MIL/uL (ref 3.87–5.11)
RDW: 13.6 % (ref 11.5–15.5)
WBC: 11.1 10*3/uL — ABNORMAL HIGH (ref 4.0–10.5)
nRBC: 0 % (ref 0.0–0.2)

## 2018-10-05 LAB — BASIC METABOLIC PANEL
Anion gap: 11 (ref 5–15)
BUN: 38 mg/dL — ABNORMAL HIGH (ref 8–23)
CO2: 34 mmol/L — ABNORMAL HIGH (ref 22–32)
Calcium: 10 mg/dL (ref 8.9–10.3)
Chloride: 93 mmol/L — ABNORMAL LOW (ref 98–111)
Creatinine, Ser: 1.67 mg/dL — ABNORMAL HIGH (ref 0.44–1.00)
GFR calc Af Amer: 32 mL/min — ABNORMAL LOW (ref >60)
GFR calc non Af Amer: 28 mL/min — ABNORMAL LOW (ref >60)
Glucose, Bld: 126 mg/dL — ABNORMAL HIGH (ref 70–99)
Potassium: 3.7 mmol/L (ref 3.5–5.1)
Sodium: 138 mmol/L (ref 135–145)

## 2018-10-05 MED ORDER — FUROSEMIDE 10 MG/ML IJ SOLN
40.0000 mg | Freq: Once | INTRAMUSCULAR | Status: AC
  Administered 2018-10-05: 09:00:00 40 mg via INTRAVENOUS
  Filled 2018-10-05: qty 4

## 2018-10-05 MED ORDER — FUROSEMIDE 40 MG PO TABS
40.0000 mg | ORAL_TABLET | Freq: Two times a day (BID) | ORAL | Status: DC
  Administered 2018-10-06: 05:00:00 40 mg via ORAL
  Filled 2018-10-05: qty 1

## 2018-10-05 MED ORDER — INSULIN ASPART 100 UNIT/ML ~~LOC~~ SOLN
5.0000 [IU] | Freq: Three times a day (TID) | SUBCUTANEOUS | Status: DC
  Administered 2018-10-05 – 2018-10-06 (×4): 5 [IU] via SUBCUTANEOUS

## 2018-10-05 MED ORDER — AMIODARONE IV BOLUS ONLY 150 MG/100ML
150.0000 mg | Freq: Once | INTRAVENOUS | Status: AC
  Administered 2018-10-05: 15:00:00 150 mg via INTRAVENOUS
  Filled 2018-10-05: qty 100

## 2018-10-05 NOTE — Progress Notes (Signed)
PROGRESS NOTE  Amy Wiggins GEX:[id number] DOB: 1931-04-02 DOA: 09/28/2018 PCP: Asencion Noble, MD  Brief History:  85 year old female with history of diabetes mellitus type 2, hypertension, hyperlipidemia, paroxysmal atrial fibrillation, stroke, PFO presenting with 2 to 3-day history of shortness of breath and generalized weakness.  The patient states that she has had the above symptoms since she returned from the grocery store on 09/26/2018.  She states that the symptoms have progressed.  She complain of some subjective fevers and chills but denied any headache, coughing, chest pain, nausea, vomiting, diarrhea, domino pain, dysuria.  Because of continued shortness of breath, she presented for further evaluation.  Patient states that she frequently misses her medications at home, up to 3 days per week.  In the ED, the patient was noted to have heart rate in the 140s.  EKG showed atrial fibrillation with RVR.  She was started on diltiazem drip which has been since converted to oral diltiazem.  Chest x-ray showed pulmonary vascular congestion.  BNP was 1053.  She was on IV furosemide.  Cardiology was consulted to assist with management.  Assessment/Plan: Acute respiratory failure with hypoxia -Secondary to CHF -Presently stable on 2 L nasal cannula -Wean oxygen for saturation 01%  Acute diastolic CHF -02/72/5366 echo EF 55-60%, grade 1 DD, no WMA -IV furosemide discontinued 8/5 and now on oral lasix.  -Monitor I/Os and clinical response -Patient stated that she weighed 202 pounds at home -Repeated echocardiogram noted below.   Atrial fibrillation with RVR - remains intermittently uncontrolled -CHADSVASc = 7 -continue apixaban -cardizem 240 mg started by cardiology 09/30/18 -discontinued metoprolol per cardiology service, started amiodarone 8/6 -TSH 0.828 -she continues to go in and out of afib with HR up to 170s, she responded to IV metoprolol -Increased cardizem to 300 mg daily  8/8, continue amiodarone 200 mg BID.   Leukocytosis -Suspect stress demargination -UA without significant pyuria -Blood cultures x2 sets--patient had been started on ceftriaxone prior to blood cultures -CT chest: no source of infection reported.  -Chest x-ray 10/01/18 without consolidation -discontinued ceftriaxone and monitoring clinically  CKD stage III -Baseline creatinine 1.4-1.7 -Monitor with diuresis  Uncontrolled diabetes mellitus type 2 with hyperglycemia -Continue Lantus -Continue NovoLog sliding scale, increased prandial coverage novolog 4 U TIDAC -09/28/2018 hemoglobin A1c 9.3  CBG (last 3)  Recent Labs    10/04/18 1629 10/04/18 2026 10/05/18 0904  GLUCAP 222* 172* 186*   Depression/anxiety -Continue Celexa   Hyperlipidemia -Continue statin  Hypokalemia -this is being repleted. Mg within normal limits.   Disposition Plan:  Continue inpatient treatments Family Communication: Pt updated bedside / granddaughter telephone  Consultants:  cardiology  Code Status:  FULL  DVT Prophylaxis:  apixaban   Procedures: Echocardiogram The left ventricle has normal systolic function, with an ejection fraction of 55-60%. The cavity size was normal. There is moderately increased left ventricular wall thickness. Left ventricular diastolic Doppler parameters are indeterminate.  2. Left atrial size was severely dilated.  3. The aortic valve is tricuspid. Mild thickening of the aortic valve. Mild calcification of the aortic valve. No stenosis of the aortic valve. Mild aortic annular calcification noted.  4. The mitral valve is abnormal. Moderate thickening of the mitral valve leaflet. Moderate calcification of the mitral valve leaflet. There is moderate mitral annular calcification present. moderate gradient across the mitral valve, mean gradient 101mmHg.  The MVA by PHT appears inaccurate, gradient would support moderate mitral stenosis mitral valve stenosis.  5. The aorta is  normal in size and structure.  6. The aortic root is normal in size and structure.  7. Pulmonary hypertension is indeterminate, inadequate TR jet.  8. The interatrial septum was not well visualized.  Antibiotics: Ceftriaxone 8/3  Subjective: Patient continues to go back into Afib RVR HR up to130s, PRN lopressor given.      Objective: Vitals:   10/04/18 1428 10/04/18 2027 10/04/18 2038 10/05/18 0610  BP: 108/72 110/73  122/71  Pulse: (!) 113 (!) 107  88  Resp: 17 18    Temp: 98 F (36.7 C) 98.5 F (36.9 C)  97.7 F (36.5 C)  TempSrc: Oral Oral  Oral  SpO2: 94% 94% 94% 93%  Weight:      Height:        Intake/Output Summary (Last 24 hours) at 10/05/2018 1138 Last data filed at 10/04/2018 1700 Gross per 24 hour  Intake 500 ml  Output -  Net 500 ml   Weight change:  Exam:   General:  Pt is alert, follows commands appropriately, not in acute distress, cooperative.   HEENT: No icterus, No thrush, No neck mass, [place]/AT  Cardiovascular: irregular rhythm, rate 110, normal S1/S2, no rubs, no gallops  Respiratory: Bibasilar crackles but no wheeze  Abdomen: Soft/+BS, non tender, non distended, no guarding  Extremities: No edema, No lymphangitis, No petechiae, No rashes, no synovitis  Data Reviewed: I have personally reviewed following labs and imaging studies Basic Metabolic Panel: Recent Labs  Lab 09/28/18 1505  09/30/18 0642 10/01/18 0916 10/02/18 0534 10/04/18 0632 10/05/18 0617  NA  --    < > 139 137 137 141 138  K  --    < > 3.3* 3.3* 3.5 3.7 3.7  CL  --    < > 101 101 100 97* 93*  CO2  --    < > 27 28 31  33* 34*  GLUCOSE  --    < > 173* 111* 98 93 126*  BUN  --    < > 46* 35* 33* 35* 38*  CREATININE  --    < > 1.99* 1.46* 1.42* 1.62* 1.67*  CALCIUM  --    < > 9.1 9.3 9.7 10.2 10.0  MG 2.0  --  2.3  --  2.1  --   --    < > = values in this interval not displayed.   Liver Function Tests: No results for input(s): AST, ALT, ALKPHOS, BILITOT, PROT, ALBUMIN in  the last 168 hours. No results for input(s): LIPASE, AMYLASE in the last 168 hours. No results for input(s): AMMONIA in the last 168 hours. Coagulation Profile: No results for input(s): INR, PROTIME in the last 168 hours. CBC: Recent Labs  Lab 09/28/18 1256 09/29/18 0633 09/30/18 0642 10/01/18 0916 10/02/18 0534 10/05/18 0617  WBC 17.5* 16.3* 11.0* 11.7* 11.6* 11.1*  NEUTROABS 15.6*  --   --   --   --   --   HGB 12.0 11.6* 10.6* 11.5* 11.0* 10.8*  HCT 38.7 38.1 36.2 37.8 36.6 35.8*  MCV 88.2 89.0 91.4 90.9 90.4 90.9  PLT 237 236 229 254 275 304   Cardiac Enzymes: No results for input(s): CKTOTAL, CKMB, CKMBINDEX, TROPONINI in the last 168 hours. BNP: Invalid input(s): POCBNP CBG: Recent Labs  Lab 10/04/18 0730 10/04/18 1131 10/04/18 1629 10/04/18 2026 10/05/18 0904  GLUCAP 93 210* 222* 172* 186*   HbA1C: No results for input(s): HGBA1C in the last 72 hours. Urine analysis:  Component Value Date/Time   COLORURINE YELLOW 09/28/2018 1216   APPEARANCEUR HAZY (A) 09/28/2018 1216   LABSPEC 1.007 09/28/2018 1216   PHURINE 5.0 09/28/2018 1216   GLUCOSEU >=500 (A) 09/28/2018 1216   HGBUR NEGATIVE 09/28/2018 1216   BILIRUBINUR NEGATIVE 09/28/2018 1216   KETONESUR NEGATIVE 09/28/2018 1216   PROTEINUR NEGATIVE 09/28/2018 1216   UROBILINOGEN 0.2 12/12/2010 1102   NITRITE NEGATIVE 09/28/2018 1216   LEUKOCYTESUR MODERATE (A) 09/28/2018 1216   Sepsis Labs: @LABRCNTIP (procalcitonin:4,lacticidven:4) ) Recent Results (from the past 240 hour(s))  Urine culture     Status: Abnormal   Collection Time: 09/28/18 12:16 PM   Specimen: Urine, Clean Catch  Result Value Ref Range Status   Specimen Description   Final    URINE, CLEAN CATCH Performed at [facility], [address], [place], [place] [address]    Special Requests   Final    NONE Performed at [facility], [address] [place], [place] [address]    Culture >=100,000 COLONIES/mL ESCHERICHIA COLI (A)  Final    Report Status 09/30/2018 FINAL  Final   Organism ID, Bacteria ESCHERICHIA COLI (A)  Final      Susceptibility   Escherichia coli - MIC*    AMPICILLIN 8 SENSITIVE Sensitive     CEFAZOLIN <=4 SENSITIVE Sensitive     CEFTRIAXONE <=1 SENSITIVE Sensitive     CIPROFLOXACIN <=0.25 SENSITIVE Sensitive     GENTAMICIN <=1 SENSITIVE Sensitive     IMIPENEM <=0.25 SENSITIVE Sensitive     NITROFURANTOIN 32 SENSITIVE Sensitive     TRIMETH/SULFA <=20 SENSITIVE Sensitive     AMPICILLIN/SULBACTAM 4 SENSITIVE Sensitive     PIP/TAZO <=4 SENSITIVE Sensitive     Extended ESBL NEGATIVE Sensitive     * >=100,000 COLONIES/mL ESCHERICHIA COLI  SARS CORONAVIRUS 2 Nasal Swab Aptima Multi Swab     Status: None   Collection Time: 09/28/18 12:17 PM   Specimen: Aptima Multi Swab; Nasal Swab  Result Value Ref Range Status   SARS Coronavirus 2 NEGATIVE NEGATIVE Final    Comment: (NOTE) SARS-CoV-2 target nucleic acids are NOT DETECTED. The SARS-CoV-2 RNA is generally detectable in upper and lower respiratory specimens during the acute phase of infection. Negative results do not preclude SARS-CoV-2 infection, do not rule out co-infections with other pathogens, and should not be used as the sole basis for treatment or other patient management decisions. Negative results must be combined with clinical observations, patient history, and epidemiological information. The expected result is Negative. Fact Sheet for Patients: SugarRoll.be Fact Sheet for Healthcare Providers: https://www.woods-mathews.com/ This test is not yet approved or cleared by the [place] FDA and  has been authorized for detection and/or diagnosis of SARS-CoV-2 by FDA under an Emergency Use Authorization (EUA). This EUA will remain  in effect (meaning this test can be used) for the duration of the COVID-19 declaration under Section 56 4(b)(1) of the Act, 21 U.S.C. section 360bbb-3(b)(1), unless the  authorization is terminated or revoked sooner. Performed at [facility], [place] [address] [place],  [address]   Culture, blood (Routine X 2) w Reflex to ID Panel     Status: None   Collection Time: 09/29/18  6:53 AM   Specimen: BLOOD RIGHT HAND  Result Value Ref Range Status   Specimen Description BLOOD RIGHT HAND  Final   Special Requests   Final    BOTTLES DRAWN AEROBIC AND ANAEROBIC Blood Culture adequate volume   Culture   Final    NO GROWTH  5 DAYS Performed at [facility], [address], [place], [place] [address]    Report Status 10/04/2018 FINAL  Final  Culture, blood (Routine X 2) w Reflex to ID Panel     Status: None   Collection Time: 09/29/18  7:22 AM   Specimen: BLOOD  Result Value Ref Range Status   Specimen Description BLOOD  Final   Special Requests NONE  Final   Culture   Final    NO GROWTH 5 DAYS Performed at [facility], [address], [place], [place] [address]    Report Status 10/04/2018 FINAL  Final     Scheduled Meds: . amiodarone  200 mg Oral BID  . apixaban  2.5 mg Oral BID  . atorvastatin  40 mg Oral QPM  . citalopram  20 mg Oral Daily  . diltiazem  300 mg Oral Daily  . [START ON 10/06/2018] furosemide  40 mg Oral BID  . insulin aspart  0-5 Units Subcutaneous QHS  . insulin aspart  0-9 Units Subcutaneous TID WC  . insulin aspart  5 Units Subcutaneous TID WC  . insulin glargine  40 Units Subcutaneous QHS  . magnesium oxide  400 mg Oral QPM  . multivitamin with minerals  1 tablet Oral QHS  . multivitamin-lutein  1 capsule Oral BID  . pantoprazole  40 mg Oral Daily  . potassium chloride  40 mEq Oral Daily  . sodium chloride flush  3 mL Intravenous Q12H  . traZODone  100 mg Oral QHS  . venlafaxine XR  75 mg Oral Q breakfast   Continuous Infusions: . sodium chloride Stopped (09/30/18 0500)    Procedures/Studies: Ct Chest Wo Contrast  Result Date: 09/29/2018 CLINICAL DATA:  Acute respiratory illness. Shortness of breath. Pleural  effusion. EXAM: CT CHEST WITHOUT CONTRAST TECHNIQUE: Multidetector CT imaging of the chest was performed following the standard protocol without IV contrast. COMPARISON:  None FINDINGS: Cardiovascular: The heart size is mildly enlarged. Aortic atherosclerosis. Calcification in the LAD, left circumflex and RCA coronary arteries. Mediastinum/Nodes: Normal appearance of the thyroid gland. The trachea appears patent and is midline. Small hiatal hernia. No mediastinal or hilar adenopathy. Lungs/Pleura: Small bilateral pleural effusions identified. There is mild interlobular septal thickening noted within the lung bases suggesting interstitial edema. Small pulmonary nodule in the left upper lobe measures 2 mm, image 29/4. Within the superior segment of the right lower lobe there is a nodule within knee diameter of 5 mm, image 74/4. Dependent atelectasis noted within both lung bases. No bronchiectasis, interstitial reticulation or honeycombing identified. Upper Abdomen: No acute abnormality. Musculoskeletal: Spondylosis within the thoracic spine. IMPRESSION: 1. Suspect mild congestive heart failure with small bilateral pleural effusions and mild lung base interstitial edema. 2. Pulmonary nodules. No follow-up needed if patient is low-risk (and has no known or suspected primary neoplasm). Non-contrast chest CT can be considered in 12 months if patient is high-risk. This recommendation follows the consensus statement: Guidelines for Management of Incidental Pulmonary Nodules Detected on CT Images: From the Fleischner Society 2017; Radiology 2017; 284:228-243. 3. Aortic Atherosclerosis (ICD10-I70.0). Coronary artery calcifications. Electronically Signed   By: Kerby Moors M.D.   On: 09/29/2018 16:50   Dg Chest Port 1 View  Result Date: 10/02/2018 CLINICAL DATA:  Weakness, hypertension, diabetes mellitus, atrial fibrillation, history stroke EXAM: PORTABLE CHEST 1 VIEW COMPARISON:  Portable exam 0544 hours compared to  09/28/2018 FINDINGS: Enlargement of cardiac silhouette with pulmonary vascular congestion. Atherosclerotic calcification aorta. Loop recorder projects over lower medial LEFT  chest. No definite acute infiltrate or pulmonary edema. No pleural effusion or pneumothorax. Bones demineralized. IMPRESSION: Enlargement of cardiac silhouette with pulmonary vascular congestion. No acute abnormalities. Electronically Signed   By: Lavonia Dana M.D.   On: 10/02/2018 08:31   Dg Chest Port 1 View  Result Date: 09/28/2018 CLINICAL DATA:  Generalized weakness today. Low blood pressure. History of diabetes and hypertension. EXAM: PORTABLE CHEST 1 VIEW COMPARISON:  Radiographs 02/10/2017 and 01/14/2016. FINDINGS: 1228 hours. Stable cardiomegaly with mild vascular congestion. There is no overt pulmonary edema, confluent airspace opacity, pleural effusion or pneumothorax. Loop recorder overlies the lower left chest. No acute osseous findings are evident. Telemetry leads overlie the chest. IMPRESSION: Cardiomegaly with mild vascular congestion.  No edema. Electronically Signed   By: Richardean Sale M.D.   On: 09/28/2018 12:41    Irwin Brakeman, MD  [facility] How to contact the [facility] Attending or Consulting provider [place] or covering provider during after hours [place], for this patient?  1. Check the care team in [facility] and look for a) attending/consulting TR[facility] provider listed and b) the [facility] team listed 2. Log into [email or web address] and use Rachel's universal password to access. If you do not have the password, please contact the hospital operator. 3. Locate the [facility] provider you are looking for under [facility] and page to a number that you can be directly reached. 4. If you still have difficulty reaching the provider, please page the [facility] (Director on Call) for the Hospitalists listed on amion for assistance.  If 7PM-7AM, please contact night-coverage [email or web address] Password [email or web address] 10/05/2018, 11:38 AM   LOS:  6 days

## 2018-10-05 NOTE — Progress Notes (Signed)
Progress Note  Patient Name: Amy Wiggins Date of Encounter: 10/05/2018  Primary Cardiologist: Virl Axe, MD   Subjective   Resting comfortably.  Has a little shortness of breath.  Inpatient Medications    Scheduled Meds:  amiodarone  200 mg Oral BID   apixaban  2.5 mg Oral BID   atorvastatin  40 mg Oral QPM   citalopram  20 mg Oral Daily   diltiazem  300 mg Oral Daily   furosemide  40 mg Intravenous Once   [START ON 10/06/2018] furosemide  40 mg Oral BID   insulin aspart  0-5 Units Subcutaneous QHS   insulin aspart  0-9 Units Subcutaneous TID WC   insulin aspart  4 Units Subcutaneous TID WC   insulin glargine  40 Units Subcutaneous QHS   magnesium oxide  400 mg Oral QPM   multivitamin with minerals  1 tablet Oral QHS   multivitamin-lutein  1 capsule Oral BID   pantoprazole  40 mg Oral Daily   potassium chloride  40 mEq Oral Daily   sodium chloride flush  3 mL Intravenous Q12H   traZODone  100 mg Oral QHS   venlafaxine XR  75 mg Oral Q breakfast   Continuous Infusions:  sodium chloride Stopped (09/30/18 0500)   PRN Meds: sodium chloride, acetaminophen **OR** acetaminophen, albuterol, metoprolol tartrate, ondansetron **OR** ondansetron (ZOFRAN) IV, polyethylene glycol, sodium chloride flush   Vital Signs    Vitals:   10/04/18 1428 10/04/18 2027 10/04/18 2038 10/05/18 0610  BP: 108/72 110/73  122/71  Pulse: (!) 113 (!) 107  88  Resp: 17 18    Temp: 98 F (36.7 C) 98.5 F (36.9 C)  97.7 F (36.5 C)  TempSrc: Oral Oral  Oral  SpO2: 94% 94% 94% 93%  Weight:      Height:        Intake/Output Summary (Last 24 hours) at 10/05/2018 0822 Last data filed at 10/04/2018 1700 Gross per 24 hour  Intake 750 ml  Output --  Net 750 ml   Filed Weights   09/28/18 1202 09/28/18 1210  Weight: 90.7 kg 90.7 kg    Telemetry    Atrial fibrillation, heart rate 90s- Personally Reviewed   Physical Exam   GEN: No acute distress.   Neck: No  JVD Cardiac:  Irregular rhythm, 2/4 diastolic rumbling murmur, no rubs, or gallops.  Respiratory:  Bilateral crackles one quarter up GI: Soft, nontender, non-distended  MS: No edema; No deformity. Neuro:  Nonfocal  Psych: Normal affect   Labs    Chemistry Recent Labs  Lab 10/02/18 0534 10/04/18 0632 10/05/18 0617  NA 137 141 138  K 3.5 3.7 3.7  CL 100 97* 93*  CO2 31 33* 34*  GLUCOSE 98 93 126*  BUN 33* 35* 38*  CREATININE 1.42* 1.62* 1.67*  CALCIUM 9.7 10.2 10.0  GFRNONAA 34* 29* 28*  GFRAA 39* 33* 32*  ANIONGAP 6 11 11      Hematology Recent Labs  Lab 10/01/18 0916 10/02/18 0534 10/05/18 0617  WBC 11.7* 11.6* 11.1*  RBC 4.16 4.05 3.94  HGB 11.5* 11.0* 10.8*  HCT 37.8 36.6 35.8*  MCV 90.9 90.4 90.9  MCH 27.6 27.2 27.4  MCHC 30.4 30.1 30.2  RDW 14.0 13.7 13.6  PLT 254 275 304    Cardiac EnzymesNo results for input(s): TROPONINI in the last 168 hours. No results for input(s): TROPIPOC in the last 168 hours.   BNP Recent Labs  Lab 09/28/18 1256  BNP  1,053.0*     DDimer No results for input(s): DDIMER in the last 168 hours.   Radiology    No results found.  Cardiac Studies   Echocardiogram 09/29/2018:  1. The left ventricle has normal systolic function, with an ejection fraction of 55-60%. The cavity size was normal. There is moderately increased left ventricular wall thickness. Left ventricular diastolic Doppler parameters are indeterminate. 2. Left atrial size was severely dilated. 3. The aortic valve is tricuspid. Mild thickening of the aortic valve. Mild calcification of the aortic valve. No stenosis of the aortic valve. Mild aortic annular calcification noted. 4. The mitral valve is abnormal. Moderate thickening of the mitral valve leaflet. Moderate calcification of the mitral valve leaflet. There is moderate mitral annular calcification present. moderate gradient across the mitral valve, mean gradient 25mmHg. The MVA by PHT appears inaccurate,  gradient would support moderate mitral stenosis mitral valve stenosis. 5. The aorta is normal in size and structure. 6. The aortic root is normal in size and structure. 7. Pulmonary hypertension is indeterminate, inadequate TR jet. 8. The interatrial septum was not well visualized.  Patient Profile     85 y.o. female with past medical history of paroxysmal atrial fibrillation (on Eliquis), HTN, HLD, Type 2 DM and prior CVA'swho is being seen today for the evaluation of atrial fibrillation with RVRat the request ofDr. Emokpae.  Assessment & Plan    1. Paroxysmal/rapid atrial fibrillation:  Heart rate in 90 bpm range and in atrial fibrillation. Severe left atrial enlargement and moderate mitral stenosis predisposed to high recurrence rate of atrial fibrillation. She remains on renally dosed Eliquis 2.5 mg twice daily.   I switched her to Cardizem CD 240 mg daily on 09/30/2018.  This has been increased to 300 mg daily by internal medicine.    Continue amiodarone 200 mg orally twice daily.  She may eventually require AV nodal ablation and permanent pacemaker.She will follow-up with Dr. Caryl Comes.  2. Acute diastolic heart failure: Currently on oral Lasix 40 mg daily.    She appears to have some pulmonary vascular congestion by exam.  I will give 1 dose of IV Lasix 40 mg now.  I will increase oral dosing to 40 mg twice daily beginning tomorrow morning. Renal function will need continued monitoring.  Continue daily potassium supplementation.  3. Hypertension: Blood pressure is normal.  4. Hyperlipidemia: Continue atorvastatin.  5. Chronic kidney disease stage III: Renal function is stable.  This will need daily monitoring given need for increased diuretic requirement.   For questions or updates, please contact [place] Please consult [email or web address] for contact info under Cardiology/STEMI.      Signed, Kate Sable, MD  10/05/2018, 8:22 AM

## 2018-10-05 NOTE — Care Management Important Message (Signed)
Important Message  Patient Details  Name: Amy Wiggins MRN: [id number] Date of Birth: 1931-09-10   Medicare Important Message Given:  Yes     [name] 10/05/2018, 2:35 PM

## 2018-10-06 LAB — MAGNESIUM: Magnesium: 2 mg/dL (ref 1.7–2.4)

## 2018-10-06 LAB — BASIC METABOLIC PANEL
Anion gap: 12 (ref 5–15)
BUN: 42 mg/dL — ABNORMAL HIGH (ref 8–23)
CO2: 33 mmol/L — ABNORMAL HIGH (ref 22–32)
Calcium: 10.1 mg/dL (ref 8.9–10.3)
Chloride: 96 mmol/L — ABNORMAL LOW (ref 98–111)
Creatinine, Ser: 1.8 mg/dL — ABNORMAL HIGH (ref 0.44–1.00)
GFR calc Af Amer: 29 mL/min — ABNORMAL LOW (ref >60)
GFR calc non Af Amer: 25 mL/min — ABNORMAL LOW (ref >60)
Glucose, Bld: 138 mg/dL — ABNORMAL HIGH (ref 70–99)
Potassium: 4 mmol/L (ref 3.5–5.1)
Sodium: 141 mmol/L (ref 135–145)

## 2018-10-06 LAB — GLUCOSE, CAPILLARY
Glucose-Capillary: 123 mg/dL — ABNORMAL HIGH (ref 70–99)
Glucose-Capillary: 179 mg/dL — ABNORMAL HIGH (ref 70–99)

## 2018-10-06 MED ORDER — AMIODARONE HCL 200 MG PO TABS: ORAL_TABLET | ORAL | 0 refills | Status: DC

## 2018-10-06 MED ORDER — FUROSEMIDE 40 MG PO TABS: 40.0000 mg | ORAL_TABLET | Freq: Every day | ORAL | Status: DC

## 2018-10-06 MED ORDER — DILTIAZEM HCL ER COATED BEADS 300 MG PO CP24: 300.0000 mg | ORAL_CAPSULE | Freq: Every day | ORAL | 1 refills | Status: DC

## 2018-10-06 MED ORDER — POTASSIUM CHLORIDE CRYS ER 20 MEQ PO TBCR: 40.0000 meq | EXTENDED_RELEASE_TABLET | Freq: Every day | ORAL | 0 refills | Status: DC

## 2018-10-06 MED ORDER — FUROSEMIDE 40 MG PO TABS: 40.0000 mg | ORAL_TABLET | Freq: Every day | ORAL | 0 refills | Status: DC

## 2018-10-06 NOTE — TOC Transition Note (Signed)
Transition of Care [facility]) - CM/SW Discharge Note   Patient Details  Name: Amy Wiggins MRN: [id number] Date of Birth: 11-20-1931  [facility] of [facility]) CM/SW Contact:  Boneta Lucks, RN Phone Number: 10/06/2018, 11:25 AM   Clinical Narrative:   Patient discharge today to home with HHPT/RN, called Vaughan Basta with [facility] to update on discharge today. MD placed orders.     Final next level of care: [place] Barriers to Discharge: Continued Medical Work up   Patient Goals and CMS Choice Patient states their goals for this hospitalization and ongoing recovery are:: to go home with home health. CMS Medicare.gov Compare Post Acute Care list provided to:: Patient Represenative (must comment)(Emily- grand-daughter) Choice offered to / list presented to : (GDTR)  D Discharge Plan and Services     Post Acute Care Choice: [place]: RN, PT [facility] Agency: [place] ([facility]) Date [place]: 10/06/18 Time [place]: 7544 Representative spoke with at [place]: Vaughan Basta       Readmission Risk Interventions No flowsheet data found.

## 2018-10-06 NOTE — Progress Notes (Signed)
Progress Note  Patient Name: Amy Wiggins Date of Encounter: 10/06/2018  Primary Cardiologist: Virl Axe, MD   Subjective   She is feeling well today and is eager to go home.  She denies shortness of breath.  Inpatient Medications    Scheduled Meds: . amiodarone  200 mg Oral BID  . apixaban  2.5 mg Oral BID  . atorvastatin  40 mg Oral QPM  . citalopram  20 mg Oral Daily  . diltiazem  300 mg Oral Daily  . [START ON 10/07/2018] furosemide  40 mg Oral Daily  . insulin aspart  0-5 Units Subcutaneous QHS  . insulin aspart  0-9 Units Subcutaneous TID WC  . insulin aspart  5 Units Subcutaneous TID WC  . insulin glargine  40 Units Subcutaneous QHS  . magnesium oxide  400 mg Oral QPM  . multivitamin with minerals  1 tablet Oral QHS  . multivitamin-lutein  1 capsule Oral BID  . pantoprazole  40 mg Oral Daily  . potassium chloride  40 mEq Oral Daily  . sodium chloride flush  3 mL Intravenous Q12H  . traZODone  100 mg Oral QHS  . venlafaxine XR  75 mg Oral Q breakfast   Continuous Infusions: . sodium chloride Stopped (09/30/18 0500)   PRN Meds: sodium chloride, acetaminophen **OR** acetaminophen, albuterol, metoprolol tartrate, ondansetron **OR** ondansetron (ZOFRAN) IV, polyethylene glycol, sodium chloride flush   Vital Signs    Vitals:   10/05/18 0610 10/05/18 2014 10/05/18 2114 10/06/18 0615  BP: 122/71  (!) 123/54 (!) 131/55  Pulse: 88  77 83  Resp:   18 18  Temp: 97.7 F (36.5 C)  98.3 F (36.8 C) 97.7 F (36.5 C)  TempSrc: Oral  Oral Oral  SpO2: 93% 91% 94% 95%  Weight:      Height:        Intake/Output Summary (Last 24 hours) at 10/06/2018 0848 Last data filed at 10/06/2018 0500 Gross per 24 hour  Intake 100 ml  Output -  Net 100 ml   Filed Weights   09/28/18 1202 09/28/18 1210  Weight: 90.7 kg 90.7 kg    Telemetry    Sinus rhythm, 80 bpm range- Personally Reviewed   Physical Exam   GEN: No acute distress.   Neck: No JVD Cardiac: RRR,  2/4 diastolic rumbling murmur, no rubs, or gallops.  Respiratory: Clear to auscultation bilaterally. GI: Soft, nontender, non-distended  MS: No edema; No deformity. Neuro:  Nonfocal  Psych: Normal affect   Labs    Chemistry Recent Labs  Lab 10/04/18 0632 10/05/18 0617 10/06/18 0551  NA 141 138 141  K 3.7 3.7 4.0  CL 97* 93* 96*  CO2 33* 34* 33*  GLUCOSE 93 126* 138*  BUN 35* 38* 42*  CREATININE 1.62* 1.67* 1.80*  CALCIUM 10.2 10.0 10.1  GFRNONAA 29* 28* 25*  GFRAA 33* 32* 29*  ANIONGAP 11 11 12      Hematology Recent Labs  Lab 10/01/18 0916 10/02/18 0534 10/05/18 0617  WBC 11.7* 11.6* 11.1*  RBC 4.16 4.05 3.94  HGB 11.5* 11.0* 10.8*  HCT 37.8 36.6 35.8*  MCV 90.9 90.4 90.9  MCH 27.6 27.2 27.4  MCHC 30.4 30.1 30.2  RDW 14.0 13.7 13.6  PLT 254 275 304    Cardiac EnzymesNo results for input(s): TROPONINI in the last 168 hours. No results for input(s): TROPIPOC in the last 168 hours.   BNPNo results for input(s): BNP, PROBNP in the last 168 hours.  DDimer No results for input(s): DDIMER in the last 168 hours.   Radiology    No results found.  Cardiac Studies   Echocardiogram 09/29/2018:  1. The left ventricle has normal systolic function, with an ejection fraction of 55-60%. The cavity size was normal. There is moderately increased left ventricular wall thickness. Left ventricular diastolic Doppler parameters are indeterminate. 2. Left atrial size was severely dilated. 3. The aortic valve is tricuspid. Mild thickening of the aortic valve. Mild calcification of the aortic valve. No stenosis of the aortic valve. Mild aortic annular calcification noted. 4. The mitral valve is abnormal. Moderate thickening of the mitral valve leaflet. Moderate calcification of the mitral valve leaflet. There is moderate mitral annular calcification present. moderate gradient across the mitral valve, mean gradient 17mmHg. The MVA by PHT appears inaccurate, gradient would  support moderate mitral stenosis mitral valve stenosis. 5. The aorta is normal in size and structure. 6. The aortic root is normal in size and structure. 7. Pulmonary hypertension is indeterminate, inadequate TR jet. 8. The interatrial septum was not well visualized.  Patient Profile     85 y.o. female with past medical history of paroxysmal atrial fibrillation (on Eliquis), HTN, HLD, Type 2 DM and prior CVA'swho is being seen today for the evaluation of atrial fibrillation with RVRat the request ofDr. Emokpae.  Assessment & Plan    1. Paroxysmal/rapid atrial fibrillation:  She is presently in sinus rhythm in 80 bpm range.  Currently on Cardizem CD 300 mg daily and amiodarone 200 mg twice daily.  After 2 full weeks of taking 200 mg twice daily she can be switched to 200 mg daily. Severe left atrial enlargement and moderate mitral stenosis predisposed to high recurrence rate of atrial fibrillation. She remains on renally dosed Eliquis 2.5 mg twice daily.She may eventually require AV nodal ablation and permanent pacemaker.She will follow-up with Dr. Caryl Comes.  I will arrange for early follow-up.  2. Acute diastolic heart failure: She received oral Lasix and IV Lasix yesterday and BUN has gone up to 42 from 38 and creatinine is up to 1.8 from 1.67.  I will reduce oral Lasix dosing to 40 mg daily for now. Renal function will need continued monitoring.  Continue daily potassium supplementation.  Physical exam indicates euvolemia.  3. Hypertension: Blood pressure is normal.  4. Hyperlipidemia: Continue atorvastatin.  5. Chronic kidney disease stage III:She received oral Lasix and IV Lasix yesterday and BUN has gone up to 42 from 38 and creatinine is up to 1.8 from 1.67.  I will reduce oral Lasix dosing to 40 mg daily for now.  I communicated with Dr. Wynetta Emery and she will be discharged today with close follow-up with EP.   For questions or updates, please contact [place] Please consult [email or web address] for contact info under Cardiology/STEMI.      Signed, Kate Sable, MD  10/06/2018, 8:48 AM

## 2018-10-06 NOTE — Progress Notes (Signed)
IV removed and discharge instructions reviewed.  Scripts sent to pharmacy.  Granddaughter to drive home

## 2018-10-06 NOTE — Discharge Summary (Signed)
Physician Discharge Summary  Amy Wiggins JHE:[id number] DOB: Apr 22, 1931 DOA: 09/28/2018  PCP: Asencion Noble, MD Cardiologist: Dr. Caryl Comes Admit date: 09/28/2018 Discharge date: 10/06/2018  Admitted From:  Home  Disposition: Home with [facility] services   Recommendations for Outpatient Follow-up:  1. Follow up with cardiology as scheduled on 10/22/2018  Home Health: RN, PT  Discharge Condition: Stable  CODE STATUS: Full  Brief Hospitalization Summary: Please see all hospital notes, images, labs for full details of the hospitalization. Dr. Talmadge Coventry HPI:  Amy Wiggins  is a 85 y.o. female with medical history significant ofdepression, diabetes, hypertension, prior stroke, chronic atrial fibrillation onEliquis,S/P loop recorder implant12/23/2017,prior TEE 06/07/15 with PFO who presents to the ED with generalized weakness and hypotension with systolic blood pressure around 80- BP improved after IV fluid bolus --Patient is a poor historian --Additional history obtained from patient's granddaughter Ms Amy Wiggins (works as an Secretary/administrator) and lives with the patient --According to granddaughter patient is not always compliant with her a.m. medications --She denies frank chest pains --- patient was apparently out in the store on Friday, 09/24/2018--- she came back very tired and weak, weakness and fatigue persisted since then --Did have some shortness of breath and dizziness In ED--she is found to be in A. fib with RVR with heart rate in the 140s--heart rate responded well to IV Cardizem initially  --Chest x-ray noted to have mild vascular congestion without pulmonary edema -BNP is elevated at 1053 from a baseline around 120  COvid 19 test is pending -UA suspicious for UTI,  WBC 17.5, no fevers  -Highly sensitive troponin is borderline at 30,  repeat 26 --Creatinine is up to 1.89 from a baseline of 1.5 -Potassium is 4.0 magnesium is 2.0  Brief History:  85 year old female with history of  diabetes mellitus type 2, hypertension, hyperlipidemia, paroxysmal atrial fibrillation, stroke, PFO presenting with 2 to 3-day history of shortness of breath and generalized weakness.  The patient states that she has had the above symptoms since she returned from the grocery store on 09/26/2018.  She states that the symptoms have progressed.  She complain of some subjective fevers and chills but denied any headache, coughing, chest pain, nausea, vomiting, diarrhea, domino pain, dysuria.  Because of continued shortness of breath, she presented for further evaluation.  Patient states that she frequently misses her medications at home, up to 3 days per week.  In the ED, the patient was noted to have heart rate in the 140s.  EKG showed atrial fibrillation with RVR.  She was started on diltiazem drip which has been since converted to oral diltiazem.  Chest x-ray showed pulmonary vascular congestion.  BNP was 1053.  She was on IV furosemide.  Cardiology was consulted to assist with management.  Assessment/Plan: Acute respiratory failure with hypoxia -Secondary to CHF -Presently stable on 2 L nasal cannula -Wean oxygen for saturation 18%  Acute diastolic CHF -56/31/4970 echo EF 55-60%, grade 1 DD, no WMA -IV furosemide discontinued 8/5 and now on oral lasix.  -Monitor I/Os and clinical response -Patient stated that she weighed 202 pounds at home -Repeated echocardiogram noted below.   Atrial fibrillation with RVR - remains intermittently uncontrolled -CHADSVASc = 7 -continue apixaban for full anticoagulation -cardizem 240 mg started by cardiology 09/30/18 -discontinued metoprolol per cardiology service, started amiodarone 8/6 -TSH 0.828 -Increased cardizem to 300 mg daily 8/8, continue amiodarone 200 mg BID thru 8/20 then 200 mg daily.    Leukocytosis -Suspect stress demargination -UA without significant pyuria -  Blood cultures x2 sets--patient had been started on ceftriaxone prior to blood  cultures -CT chest: no source of infection reported.  -Chest x-ray 10/01/18 without consolidation -discontinued ceftriaxone and monitoring clinically  CKD stage III -Baseline creatinine 1.4-1.7 -Monitor with diuresis  Uncontrolled diabetes mellitus type 2 with hyperglycemia -Continue Lantus -Continue NovoLog sliding scale, increased prandial coverage novolog 4 U TIDAC -09/28/2018 hemoglobin A1c 9.3  CBG (last 3)  Recent Labs (last 2 labs)        Recent Labs    10/04/18 1629 10/04/18 2026 10/05/18 0904  GLUCAP 222* 172* 186*     Depression/anxiety -Continue Celexa   Hyperlipidemia -Continue statin  Hypokalemia -this is being repleted. Mg within normal limits.   Disposition Plan:  Continue inpatient treatments Family Communication: Pt updated bedside / granddaughter telephone  Consultants:  cardiology  Code Status:  FULL  DVT Prophylaxis:  apixaban   Procedures: Echocardiogram The left ventricle has normal systolic function, with an ejection fraction of 55-60%. The cavity size was normal. There is moderately increased left ventricular wall thickness. Left ventricular diastolic Doppler parameters are indeterminate. 2. Left atrial size was severely dilated. 3. The aortic valve is tricuspid. Mild thickening of the aortic valve. Mild calcification of the aortic valve. No stenosis of the aortic valve. Mild aortic annular calcification noted. 4. The mitral valve is abnormal. Moderate thickening of the mitral valve leaflet. Moderate calcification of the mitral valve leaflet. There is moderate mitral annular calcification present. moderate gradient across the mitral valve, mean gradient 28mmHg. The MVA by PHT appears inaccurate, gradient would support moderate mitral stenosis mitral valve stenosis. 5. The aorta is normal in size and structure. 6. The aortic root is normal in size and structure. 7. Pulmonary hypertension is indeterminate, inadequate TR jet. 8.  The interatrial septum was not well visualized.  Antibiotics: Ceftriaxone 8/3   Discharge Diagnoses:  Principal Problem:   Chronic a-fib with RVR Active Problems:   Muscle weakness (generalized)   Hypertension   CKD (chronic kidney disease) stage 4, GFR 15-29 ml/min (HCC)   DM type 2 (diabetes mellitus, type 2) (HCC)   Cerebrovascular accident (CVA) due to embolism of right posterior cerebral artery (HCC)   HFpEF/Chronic diastolic congestive heart failure (HCC)   Hypotension   Acute diastolic CHF (congestive heart failure) (HCC)   Atrial fibrillation with RVR (HCC)   CKD (chronic kidney disease) stage 3, GFR 30-59 ml/min (HCC)   Acute respiratory failure with hypoxia (HCC)   Acute on chronic congestive heart failure [facility])   Discharge Instructions: Discharge Instructions    Call MD for:  difficulty breathing, headache or visual disturbances   Complete by: As directed    Call MD for:  extreme fatigue   Complete by: As directed    Call MD for:  persistant dizziness or light-headedness   Complete by: As directed    Call MD for:  persistant nausea and vomiting   Complete by: As directed    Call MD for:  redness, tenderness, or signs of infection (pain, swelling, redness, odor or green/yellow discharge around incision site)   Complete by: As directed    Call MD for:  severe uncontrolled pain   Complete by: As directed    Increase activity slowly   Complete by: As directed      Allergies as of 10/06/2018   No Known Allergies     Medication List    STOP taking these medications   chlorthalidone 25 MG tablet Commonly known as: HYGROTON  losartan 100 MG tablet Commonly known as: COZAAR   metoprolol tartrate 25 MG tablet Commonly known as: LOPRESSOR     TAKE these medications   acetaminophen 500 MG tablet Commonly known as: TYLENOL Take 1,000 mg by mouth at bedtime. Take 2 tablets (1000 mg) by mouth daily at bedtime, may also take 2 tablets during the day as needed  for pain   amiodarone 200 MG tablet Commonly known as: PACERONE 1 po BID thru 8/20, then 1 po daily   atorvastatin 20 MG tablet Commonly known as: LIPITOR Take 2 tablets (40 mg total) by mouth every evening. 4pm What changed:   how much to take  when to take this  additional instructions   buPROPion 300 MG 24 hr tablet Commonly known as: WELLBUTRIN XL Take 300 mg by mouth daily.   citalopram 20 MG tablet Commonly known as: CELEXA Take 20 mg by mouth daily.   diltiazem 300 MG 24 hr capsule Commonly known as: CARDIZEM CD Take 1 capsule (300 mg total) by mouth daily. Start taking on: October 07, 2018   Eliquis 2.5 MG Tabs tablet Generic drug: apixaban Take 2.5 mg by mouth 2 (two) times daily.   furosemide 40 MG tablet Commonly known as: LASIX Take 1 tablet (40 mg total) by mouth daily. Start taking on: October 07, 2018   Ginkgo Biloba 120 MG Caps Take 120 mg by mouth daily.   Lantus SoloStar 100 UNIT/ML Solostar Pen Generic drug: Insulin Glargine Inject 44 Units into the skin daily.   Magnesium Oxide 250 MG Tabs Take 500 mg by mouth every evening. 500mg  tablets on hand   multivitamin with minerals Tabs tablet Take 1 tablet by mouth at bedtime. For supplement   pantoprazole 40 MG tablet Commonly known as: PROTONIX Take 40 mg by mouth daily. For acid reflux/gerd   potassium chloride SA 20 MEQ tablet Commonly known as: K-DUR Take 2 tablets (40 mEq total) by mouth daily. Start taking on: October 07, 2018   PreserVision AREDS 2 Caps Take 1 capsule by mouth 2 (two) times daily.   traZODone 100 MG tablet Commonly known as: DESYREL Take 150 mg by mouth at bedtime.      Follow-up Information    [facility]   Specialty: Home Health Services Why: PT/RN       Asencion Noble, MD Follow up on 10/09/2018.   Specialty: Internal Medicine Why: Friday at 3:45 for your hospital follow up appointment Contact information: [address] [place] [address] [phone]        Baldwin Jamaica, PA-C Follow up on 10/22/2018.   Specialty: Cardiology Why: Cardiology Hospital Follow-Up on 10/22/2018 at 11:00 AM with Tommye Standard, PA-C (works with Dr. Caryl Comes).  Contact information: [address] [place] [address] [phone]          No Known Allergies Allergies as of 10/06/2018   No Known Allergies     Medication List    STOP taking these medications   chlorthalidone 25 MG tablet Commonly known as: HYGROTON   losartan 100 MG tablet Commonly known as: COZAAR   metoprolol tartrate 25 MG tablet Commonly known as: LOPRESSOR     TAKE these medications   acetaminophen 500 MG tablet Commonly known as: TYLENOL Take 1,000 mg by mouth at bedtime. Take 2 tablets (1000 mg) by mouth daily at bedtime, may also take 2 tablets during the day as needed for pain   amiodarone 200  MG tablet Commonly known as: PACERONE 1 po BID thru 8/20, then 1 po daily   atorvastatin 20 MG tablet Commonly known as: LIPITOR Take 2 tablets (40 mg total) by mouth every evening. 4pm What changed:   how much to take  when to take this  additional instructions   buPROPion 300 MG 24 hr tablet Commonly known as: WELLBUTRIN XL Take 300 mg by mouth daily.   citalopram 20 MG tablet Commonly known as: CELEXA Take 20 mg by mouth daily.   diltiazem 300 MG 24 hr capsule Commonly known as: CARDIZEM CD Take 1 capsule (300 mg total) by mouth daily. Start taking on: October 07, 2018   Eliquis 2.5 MG Tabs tablet Generic drug: apixaban Take 2.5 mg by mouth 2 (two) times daily.   furosemide 40 MG tablet Commonly known as: LASIX Take 1 tablet (40 mg total) by mouth daily. Start taking on: October 07, 2018   Ginkgo Biloba 120 MG Caps Take 120 mg by mouth daily.   Lantus SoloStar 100 UNIT/ML Solostar Pen Generic drug: Insulin Glargine Inject 44 Units into the skin daily.   Magnesium Oxide 250 MG  Tabs Take 500 mg by mouth every evening. 500mg  tablets on hand   multivitamin with minerals Tabs tablet Take 1 tablet by mouth at bedtime. For supplement   pantoprazole 40 MG tablet Commonly known as: PROTONIX Take 40 mg by mouth daily. For acid reflux/gerd   potassium chloride SA 20 MEQ tablet Commonly known as: K-DUR Take 2 tablets (40 mEq total) by mouth daily. Start taking on: October 07, 2018   PreserVision AREDS 2 Caps Take 1 capsule by mouth 2 (two) times daily.   traZODone 100 MG tablet Commonly known as: DESYREL Take 150 mg by mouth at bedtime.      Procedures/Studies: Ct Chest Wo Contrast  Result Date: 09/29/2018 CLINICAL DATA:  Acute respiratory illness. Shortness of breath. Pleural effusion. EXAM: CT CHEST WITHOUT CONTRAST TECHNIQUE: Multidetector CT imaging of the chest was performed following the standard protocol without IV contrast. COMPARISON:  None FINDINGS: Cardiovascular: The heart size is mildly enlarged. Aortic atherosclerosis. Calcification in the LAD, left circumflex and RCA coronary arteries. Mediastinum/Nodes: Normal appearance of the thyroid gland. The trachea appears patent and is midline. Small hiatal hernia. No mediastinal or hilar adenopathy. Lungs/Pleura: Small bilateral pleural effusions identified. There is mild interlobular septal thickening noted within the lung bases suggesting interstitial edema. Small pulmonary nodule in the left upper lobe measures 2 mm, image 29/4. Within the superior segment of the right lower lobe there is a nodule within knee diameter of 5 mm, image 74/4. Dependent atelectasis noted within both lung bases. No bronchiectasis, interstitial reticulation or honeycombing identified. Upper Abdomen: No acute abnormality. Musculoskeletal: Spondylosis within the thoracic spine. IMPRESSION: 1. Suspect mild congestive heart failure with small bilateral pleural effusions and mild lung base interstitial edema. 2. Pulmonary nodules. No follow-up  needed if patient is low-risk (and has no known or suspected primary neoplasm). Non-contrast chest CT can be considered in 12 months if patient is high-risk. This recommendation follows the consensus statement: Guidelines for Management of Incidental Pulmonary Nodules Detected on CT Images: From the Fleischner Society 2017; Radiology 2017; 284:228-243. 3. Aortic Atherosclerosis (ICD10-I70.0). Coronary artery calcifications. Electronically Signed   By: Kerby Moors M.D.   On: 09/29/2018 16:50   Dg Chest Port 1 View  Result Date: 10/02/2018 CLINICAL DATA:  Weakness, hypertension, diabetes mellitus, atrial fibrillation, history stroke EXAM: PORTABLE CHEST 1 VIEW COMPARISON:  Portable  exam 5035 hours compared to 09/28/2018 FINDINGS: Enlargement of cardiac silhouette with pulmonary vascular congestion. Atherosclerotic calcification aorta. Loop recorder projects over lower medial LEFT chest. No definite acute infiltrate or pulmonary edema. No pleural effusion or pneumothorax. Bones demineralized. IMPRESSION: Enlargement of cardiac silhouette with pulmonary vascular congestion. No acute abnormalities. Electronically Signed   By: Lavonia Dana M.D.   On: 10/02/2018 08:31   Dg Chest Port 1 View  Result Date: 09/28/2018 CLINICAL DATA:  Generalized weakness today. Low blood pressure. History of diabetes and hypertension. EXAM: PORTABLE CHEST 1 VIEW COMPARISON:  Radiographs 02/10/2017 and 01/14/2016. FINDINGS: 1228 hours. Stable cardiomegaly with mild vascular congestion. There is no overt pulmonary edema, confluent airspace opacity, pleural effusion or pneumothorax. Loop recorder overlies the lower left chest. No acute osseous findings are evident. Telemetry leads overlie the chest. IMPRESSION: Cardiomegaly with mild vascular congestion.  No edema. Electronically Signed   By: Richardean Sale M.D.   On: 09/28/2018 12:41      Subjective: Patient without complaints.  Patient has been ambulating.  Patient is urinating  well.  Patient has diuresed well.  Patient denies shortness of breath and chest pain and palpitations.  Patient anxious to go home.  Discharge Exam: Vitals:   10/05/18 2114 10/06/18 0615  BP: (!) 123/54 (!) 131/55  Pulse: 77 83  Resp: 18 18  Temp: 98.3 F (36.8 C) 97.7 F (36.5 C)  SpO2: 94% 95%   Vitals:   10/05/18 0610 10/05/18 2014 10/05/18 2114 10/06/18 0615  BP: 122/71  (!) 123/54 (!) 131/55  Pulse: 88  77 83  Resp:   18 18  Temp: 97.7 F (36.5 C)  98.3 F (36.8 C) 97.7 F (36.5 C)  TempSrc: Oral  Oral Oral  SpO2: 93% 91% 94% 95%  Weight:      Height:       General: Pt is alert, awake, not in acute distress Cardiovascular: Irregularly irregular S1/S2 +, no rubs, no gallops Respiratory: CTA bilaterally, no wheezing, no rhonchi Abdominal: Soft, NT, ND, bowel sounds + Extremities: Trace pretibial edema bilateral lower extremities, no cyanosis   The results of significant diagnostics from this hospitalization (including imaging, microbiology, ancillary and laboratory) are listed below for reference.     Microbiology: Recent Results (from the past 240 hour(s))  Urine culture     Status: Abnormal   Collection Time: 09/28/18 12:16 PM   Specimen: Urine, Clean Catch  Result Value Ref Range Status   Specimen Description   Final    URINE, CLEAN CATCH Performed at [facility], [address] [place], [place] [address]    Special Requests   Final    NONE Performed at [facility] [address] [place],  [address]    Culture >=100,000 COLONIES/mL ESCHERICHIA COLI (A)  Final   Report Status 09/30/2018 FINAL  Final   Organism ID, Bacteria ESCHERICHIA COLI (A)  Final      Susceptibility   Escherichia coli - MIC*    AMPICILLIN 8 SENSITIVE Sensitive     CEFAZOLIN <=4 SENSITIVE Sensitive     CEFTRIAXONE <=1 SENSITIVE Sensitive     CIPROFLOXACIN <=0.25 SENSITIVE Sensitive     GENTAMICIN <=1 SENSITIVE Sensitive     IMIPENEM <=0.25 SENSITIVE Sensitive      NITROFURANTOIN 32 SENSITIVE Sensitive     TRIMETH/SULFA <=20 SENSITIVE Sensitive     AMPICILLIN/SULBACTAM 4 SENSITIVE Sensitive     PIP/TAZO <=4 SENSITIVE Sensitive     Extended ESBL NEGATIVE Sensitive     * >=  100,000 COLONIES/mL ESCHERICHIA COLI  SARS CORONAVIRUS 2 Nasal Swab Aptima Multi Swab     Status: None   Collection Time: 09/28/18 12:17 PM   Specimen: Aptima Multi Swab; Nasal Swab  Result Value Ref Range Status   SARS Coronavirus 2 NEGATIVE NEGATIVE Final    Comment: (NOTE) SARS-CoV-2 target nucleic acids are NOT DETECTED. The SARS-CoV-2 RNA is generally detectable in upper and lower respiratory specimens during the acute phase of infection. Negative results do not preclude SARS-CoV-2 infection, do not rule out co-infections with other pathogens, and should not be used as the sole basis for treatment or other patient management decisions. Negative results must be combined with clinical observations, patient history, and epidemiological information. The expected result is Negative. Fact Sheet for Patients: SugarRoll.be Fact Sheet for Healthcare Providers: https://www.woods-mathews.com/ This test is not yet approved or cleared by the [place] FDA and  has been authorized for detection and/or diagnosis of SARS-CoV-2 by FDA under an Emergency Use Authorization (EUA). This EUA will remain  in effect (meaning this test can be used) for the duration of the COVID-19 declaration under Section 56 4(b)(1) of the Act, 21 U.S.C. section 360bbb-3(b)(1), unless the authorization is terminated or revoked sooner. Performed at [facility], [place] [address], [place], [place] [address]   Culture, blood (Routine X 2) w Reflex to ID Panel     Status: None   Collection Time: 09/29/18  6:53 AM   Specimen: BLOOD RIGHT HAND  Result Value Ref Range Status   Specimen Description BLOOD RIGHT HAND  Final   Special Requests   Final    BOTTLES DRAWN  AEROBIC AND ANAEROBIC Blood Culture adequate volume   Culture   Final    NO GROWTH 5 DAYS Performed at [facility], [address] [place], [place] [address]    Report Status 10/04/2018 FINAL  Final  Culture, blood (Routine X 2) w Reflex to ID Panel     Status: None   Collection Time: 09/29/18  7:22 AM   Specimen: BLOOD  Result Value Ref Range Status   Specimen Description BLOOD  Final   Special Requests NONE  Final   Culture   Final    NO GROWTH 5 DAYS Performed at [facility], [address] [place], [place] [address]    Report Status 10/04/2018 FINAL  Final     Labs: BNP (last 3 results) Recent Labs    09/28/18 1256  BNP 8,657.8*   Basic Metabolic Panel: Recent Labs  Lab 09/30/18 0642 10/01/18 0916 10/02/18 0534 10/04/18 0632 10/05/18 0617 10/06/18 0551  NA 139 137 137 141 138 141  K 3.3* 3.3* 3.5 3.7 3.7 4.0  CL 101 101 100 97* 93* 96*  CO2 27 28 31  33* 34* 33*  GLUCOSE 173* 111* 98 93 126* 138*  BUN 46* 35* 33* 35* 38* 42*  CREATININE 1.99* 1.46* 1.42* 1.62* 1.67* 1.80*  CALCIUM 9.1 9.3 9.7 10.2 10.0 10.1  MG 2.3  --  2.1  --   --  2.0   Liver Function Tests: No results for input(s): AST, ALT, ALKPHOS, BILITOT, PROT, ALBUMIN in the last 168 hours. No results for input(s): LIPASE, AMYLASE in the last 168 hours. No results for input(s): AMMONIA in the last 168 hours. CBC: Recent Labs  Lab 09/30/18 0642 10/01/18 0916 10/02/18 0534 10/05/18 0617  WBC 11.0* 11.7* 11.6* 11.1*  HGB 10.6* 11.5* 11.0* 10.8*  HCT 36.2 37.8 36.6 35.8*  MCV 91.4 90.9 90.4 90.9  PLT 229 254 275 304   Cardiac Enzymes: No results for input(s): CKTOTAL, CKMB, CKMBINDEX, TROPONINI in the last 168 hours. BNP: Invalid input(s): POCBNP CBG: Recent Labs  Lab 10/05/18 0904 10/05/18 1229 10/05/18 1703 10/05/18 2115 10/06/18 0743  GLUCAP 186* 220* 112* 137* 123*   D-Dimer No results for input(s): DDIMER in the last 72 hours. Hgb A1c No results for input(s): HGBA1C in the  last 72 hours. Lipid Profile No results for input(s): CHOL, HDL, LDLCALC, TRIG, CHOLHDL, LDLDIRECT in the last 72 hours. Thyroid function studies No results for input(s): TSH, T4TOTAL, T3FREE, THYROIDAB in the last 72 hours.  Invalid input(s): FREET3 Anemia work up No results for input(s): VITAMINB12, FOLATE, FERRITIN, TIBC, IRON, RETICCTPCT in the last 72 hours. Urinalysis    Component Value Date/Time   COLORURINE YELLOW 09/28/2018 1216   APPEARANCEUR HAZY (A) 09/28/2018 1216   LABSPEC 1.007 09/28/2018 1216   PHURINE 5.0 09/28/2018 1216   GLUCOSEU >=500 (A) 09/28/2018 1216   HGBUR NEGATIVE 09/28/2018 1216   BILIRUBINUR NEGATIVE 09/28/2018 1216   KETONESUR NEGATIVE 09/28/2018 1216   PROTEINUR NEGATIVE 09/28/2018 1216   UROBILINOGEN 0.2 12/12/2010 1102   NITRITE NEGATIVE 09/28/2018 1216   LEUKOCYTESUR MODERATE (A) 09/28/2018 1216   Sepsis Labs Invalid input(s): PROCALCITONIN,  WBC,  LACTICIDVEN Microbiology Recent Results (from the past 240 hour(s))  Urine culture     Status: Abnormal   Collection Time: 09/28/18 12:16 PM   Specimen: Urine, Clean Catch  Result Value Ref Range Status   Specimen Description   Final    URINE, CLEAN CATCH Performed at [facility], [address], [place], [place] [address]    Special Requests   Final    NONE Performed at [facility], [address] [place], [place] [address]    Culture >=100,000 COLONIES/mL ESCHERICHIA COLI (A)  Final   Report Status 09/30/2018 FINAL  Final   Organism ID, Bacteria ESCHERICHIA COLI (A)  Final      Susceptibility   Escherichia coli - MIC*    AMPICILLIN 8 SENSITIVE Sensitive     CEFAZOLIN <=4 SENSITIVE Sensitive     CEFTRIAXONE <=1 SENSITIVE Sensitive     CIPROFLOXACIN <=0.25 SENSITIVE Sensitive     GENTAMICIN <=1 SENSITIVE Sensitive     IMIPENEM <=0.25 SENSITIVE Sensitive     NITROFURANTOIN 32 SENSITIVE Sensitive     TRIMETH/SULFA <=20 SENSITIVE Sensitive     AMPICILLIN/SULBACTAM 4 SENSITIVE Sensitive      PIP/TAZO <=4 SENSITIVE Sensitive     Extended ESBL NEGATIVE Sensitive     * >=100,000 COLONIES/mL ESCHERICHIA COLI  SARS CORONAVIRUS 2 Nasal Swab Aptima Multi Swab     Status: None   Collection Time: 09/28/18 12:17 PM   Specimen: Aptima Multi Swab; Nasal Swab  Result Value Ref Range Status   SARS Coronavirus 2 NEGATIVE NEGATIVE Final    Comment: (NOTE) SARS-CoV-2 target nucleic acids are NOT DETECTED. The SARS-CoV-2 RNA is generally detectable in upper and lower respiratory specimens during the acute phase of infection. Negative results do not preclude SARS-CoV-2 infection, do not rule out co-infections with other pathogens, and should not be used as the sole basis for treatment or other patient management decisions. Negative results must be combined with clinical observations, patient history, and epidemiological information. The expected result is Negative. Fact Sheet for Patients: SugarRoll.be Fact Sheet for Healthcare Providers: https://www.woods-mathews.com/ This test is not yet approved or cleared by the [place] FDA and  has been authorized for detection and/or diagnosis of  SARS-CoV-2 by FDA under an Emergency Use Authorization (EUA). This EUA will remain  in effect (meaning this test can be used) for the duration of the COVID-19 declaration under Section 56 4(b)(1) of the Act, 21 U.S.C. section 360bbb-3(b)(1), unless the authorization is terminated or revoked sooner. Performed at [facility], [place] [address], [place], [place] [address]   Culture, blood (Routine X 2) w Reflex to ID Panel     Status: None   Collection Time: 09/29/18  6:53 AM   Specimen: BLOOD RIGHT HAND  Result Value Ref Range Status   Specimen Description BLOOD RIGHT HAND  Final   Special Requests   Final    BOTTLES DRAWN AEROBIC AND ANAEROBIC Blood Culture adequate volume   Culture   Final    NO GROWTH 5 DAYS Performed at [facility], [address] [place], [place] [address]    Report Status 10/04/2018 FINAL  Final  Culture, blood (Routine X 2) w Reflex to ID Panel     Status: None   Collection Time: 09/29/18  7:22 AM   Specimen: BLOOD  Result Value Ref Range Status   Specimen Description BLOOD  Final   Special Requests NONE  Final   Culture   Final    NO GROWTH 5 DAYS Performed at [facility], [address] [place], [place] [address]    Report Status 10/04/2018 FINAL  Final   Time coordinating discharge: 32 minutes   SIGNED:  Irwin Brakeman, MD  [facility] 10/06/2018, 11:08 AM How to contact the [facility] Attending or Consulting provider [place] or covering provider during after hours [place], for this patient?  1. Check the care team in [facility] and look for a) attending/consulting [facility] provider listed and b) the [facility] team listed 2. Log into [email or web address] and use [facility] universal password to access. If you do not have the password, please contact the hospital operator. 3. Locate the [facility] provider you are looking for under [facility] and page to a number that you can be directly reached. 4. If you still have difficulty reaching the provider, please page the [facility] (Director on Call) for the Hospitalists listed on amion for assistance.

## 2018-10-07 DIAGNOSIS — I5033 Acute on chronic diastolic (congestive) heart failure: Secondary | ICD-10-CM | POA: Diagnosis not present

## 2018-10-07 DIAGNOSIS — M6281 Muscle weakness (generalized): Secondary | ICD-10-CM | POA: Diagnosis not present

## 2018-10-07 DIAGNOSIS — E785 Hyperlipidemia, unspecified: Secondary | ICD-10-CM | POA: Diagnosis not present

## 2018-10-07 DIAGNOSIS — I482 Chronic atrial fibrillation, unspecified: Secondary | ICD-10-CM | POA: Diagnosis not present

## 2018-10-07 DIAGNOSIS — I110 Hypertensive heart disease with heart failure: Secondary | ICD-10-CM | POA: Diagnosis not present

## 2018-10-07 DIAGNOSIS — E1165 Type 2 diabetes mellitus with hyperglycemia: Secondary | ICD-10-CM | POA: Diagnosis not present

## 2018-10-07 DIAGNOSIS — F341 Dysthymic disorder: Secondary | ICD-10-CM | POA: Diagnosis not present

## 2018-10-07 DIAGNOSIS — E1122 Type 2 diabetes mellitus with diabetic chronic kidney disease: Secondary | ICD-10-CM | POA: Diagnosis not present

## 2018-10-07 DIAGNOSIS — N184 Chronic kidney disease, stage 4 (severe): Secondary | ICD-10-CM | POA: Diagnosis not present

## 2018-10-09 DIAGNOSIS — I480 Paroxysmal atrial fibrillation: Secondary | ICD-10-CM | POA: Diagnosis not present

## 2018-10-09 DIAGNOSIS — I482 Chronic atrial fibrillation, unspecified: Secondary | ICD-10-CM | POA: Diagnosis not present

## 2018-10-09 DIAGNOSIS — E785 Hyperlipidemia, unspecified: Secondary | ICD-10-CM | POA: Diagnosis not present

## 2018-10-09 DIAGNOSIS — I5032 Chronic diastolic (congestive) heart failure: Secondary | ICD-10-CM | POA: Diagnosis not present

## 2018-10-09 DIAGNOSIS — M6281 Muscle weakness (generalized): Secondary | ICD-10-CM | POA: Diagnosis not present

## 2018-10-09 DIAGNOSIS — I5033 Acute on chronic diastolic (congestive) heart failure: Secondary | ICD-10-CM | POA: Diagnosis not present

## 2018-10-09 DIAGNOSIS — I110 Hypertensive heart disease with heart failure: Secondary | ICD-10-CM | POA: Diagnosis not present

## 2018-10-09 DIAGNOSIS — F341 Dysthymic disorder: Secondary | ICD-10-CM | POA: Diagnosis not present

## 2018-10-09 DIAGNOSIS — E1122 Type 2 diabetes mellitus with diabetic chronic kidney disease: Secondary | ICD-10-CM | POA: Diagnosis not present

## 2018-10-09 DIAGNOSIS — E1165 Type 2 diabetes mellitus with hyperglycemia: Secondary | ICD-10-CM | POA: Diagnosis not present

## 2018-10-09 DIAGNOSIS — N184 Chronic kidney disease, stage 4 (severe): Secondary | ICD-10-CM | POA: Diagnosis not present

## 2018-10-10 DIAGNOSIS — N184 Chronic kidney disease, stage 4 (severe): Secondary | ICD-10-CM | POA: Diagnosis not present

## 2018-10-10 DIAGNOSIS — I482 Chronic atrial fibrillation, unspecified: Secondary | ICD-10-CM | POA: Diagnosis not present

## 2018-10-10 DIAGNOSIS — E1165 Type 2 diabetes mellitus with hyperglycemia: Secondary | ICD-10-CM | POA: Diagnosis not present

## 2018-10-10 DIAGNOSIS — I5033 Acute on chronic diastolic (congestive) heart failure: Secondary | ICD-10-CM | POA: Diagnosis not present

## 2018-10-10 DIAGNOSIS — E1122 Type 2 diabetes mellitus with diabetic chronic kidney disease: Secondary | ICD-10-CM | POA: Diagnosis not present

## 2018-10-10 DIAGNOSIS — M6281 Muscle weakness (generalized): Secondary | ICD-10-CM | POA: Diagnosis not present

## 2018-10-10 DIAGNOSIS — I110 Hypertensive heart disease with heart failure: Secondary | ICD-10-CM | POA: Diagnosis not present

## 2018-10-10 DIAGNOSIS — E785 Hyperlipidemia, unspecified: Secondary | ICD-10-CM | POA: Diagnosis not present

## 2018-10-10 DIAGNOSIS — F341 Dysthymic disorder: Secondary | ICD-10-CM | POA: Diagnosis not present

## 2018-10-12 ENCOUNTER — Encounter: Payer: Medicare HMO | Admitting: *Deleted

## 2018-10-12 ENCOUNTER — Other Ambulatory Visit: Payer: Self-pay

## 2018-10-12 DIAGNOSIS — I482 Chronic atrial fibrillation, unspecified: Secondary | ICD-10-CM | POA: Diagnosis not present

## 2018-10-12 DIAGNOSIS — N184 Chronic kidney disease, stage 4 (severe): Secondary | ICD-10-CM | POA: Diagnosis not present

## 2018-10-12 DIAGNOSIS — F341 Dysthymic disorder: Secondary | ICD-10-CM | POA: Diagnosis not present

## 2018-10-12 DIAGNOSIS — E785 Hyperlipidemia, unspecified: Secondary | ICD-10-CM | POA: Diagnosis not present

## 2018-10-12 DIAGNOSIS — E1122 Type 2 diabetes mellitus with diabetic chronic kidney disease: Secondary | ICD-10-CM | POA: Diagnosis not present

## 2018-10-12 DIAGNOSIS — M6281 Muscle weakness (generalized): Secondary | ICD-10-CM | POA: Diagnosis not present

## 2018-10-12 DIAGNOSIS — I5033 Acute on chronic diastolic (congestive) heart failure: Secondary | ICD-10-CM | POA: Diagnosis not present

## 2018-10-12 DIAGNOSIS — I110 Hypertensive heart disease with heart failure: Secondary | ICD-10-CM | POA: Diagnosis not present

## 2018-10-12 DIAGNOSIS — E1165 Type 2 diabetes mellitus with hyperglycemia: Secondary | ICD-10-CM | POA: Diagnosis not present

## 2018-10-12 LAB — CUP PACEART REMOTE DEVICE CHECK
Date Time Interrogation Session: 20200816183934
Implantable Pulse Generator Implant Date: 20170412

## 2018-10-13 DIAGNOSIS — I482 Chronic atrial fibrillation, unspecified: Secondary | ICD-10-CM | POA: Diagnosis not present

## 2018-10-13 DIAGNOSIS — E785 Hyperlipidemia, unspecified: Secondary | ICD-10-CM | POA: Diagnosis not present

## 2018-10-13 DIAGNOSIS — E1122 Type 2 diabetes mellitus with diabetic chronic kidney disease: Secondary | ICD-10-CM | POA: Diagnosis not present

## 2018-10-13 DIAGNOSIS — F341 Dysthymic disorder: Secondary | ICD-10-CM | POA: Diagnosis not present

## 2018-10-13 DIAGNOSIS — I5033 Acute on chronic diastolic (congestive) heart failure: Secondary | ICD-10-CM | POA: Diagnosis not present

## 2018-10-13 DIAGNOSIS — M6281 Muscle weakness (generalized): Secondary | ICD-10-CM | POA: Diagnosis not present

## 2018-10-13 DIAGNOSIS — N184 Chronic kidney disease, stage 4 (severe): Secondary | ICD-10-CM | POA: Diagnosis not present

## 2018-10-13 DIAGNOSIS — E1165 Type 2 diabetes mellitus with hyperglycemia: Secondary | ICD-10-CM | POA: Diagnosis not present

## 2018-10-13 DIAGNOSIS — I110 Hypertensive heart disease with heart failure: Secondary | ICD-10-CM | POA: Diagnosis not present

## 2018-10-14 DIAGNOSIS — I5033 Acute on chronic diastolic (congestive) heart failure: Secondary | ICD-10-CM | POA: Diagnosis not present

## 2018-10-14 DIAGNOSIS — I482 Chronic atrial fibrillation, unspecified: Secondary | ICD-10-CM | POA: Diagnosis not present

## 2018-10-14 DIAGNOSIS — N184 Chronic kidney disease, stage 4 (severe): Secondary | ICD-10-CM | POA: Diagnosis not present

## 2018-10-14 DIAGNOSIS — E1165 Type 2 diabetes mellitus with hyperglycemia: Secondary | ICD-10-CM | POA: Diagnosis not present

## 2018-10-14 DIAGNOSIS — E785 Hyperlipidemia, unspecified: Secondary | ICD-10-CM | POA: Diagnosis not present

## 2018-10-14 DIAGNOSIS — I110 Hypertensive heart disease with heart failure: Secondary | ICD-10-CM | POA: Diagnosis not present

## 2018-10-14 DIAGNOSIS — F341 Dysthymic disorder: Secondary | ICD-10-CM | POA: Diagnosis not present

## 2018-10-14 DIAGNOSIS — E1122 Type 2 diabetes mellitus with diabetic chronic kidney disease: Secondary | ICD-10-CM | POA: Diagnosis not present

## 2018-10-14 DIAGNOSIS — M6281 Muscle weakness (generalized): Secondary | ICD-10-CM | POA: Diagnosis not present

## 2018-10-15 DIAGNOSIS — I482 Chronic atrial fibrillation, unspecified: Secondary | ICD-10-CM | POA: Diagnosis not present

## 2018-10-15 DIAGNOSIS — N184 Chronic kidney disease, stage 4 (severe): Secondary | ICD-10-CM | POA: Diagnosis not present

## 2018-10-15 DIAGNOSIS — I110 Hypertensive heart disease with heart failure: Secondary | ICD-10-CM | POA: Diagnosis not present

## 2018-10-15 DIAGNOSIS — I5033 Acute on chronic diastolic (congestive) heart failure: Secondary | ICD-10-CM | POA: Diagnosis not present

## 2018-10-15 DIAGNOSIS — E1122 Type 2 diabetes mellitus with diabetic chronic kidney disease: Secondary | ICD-10-CM | POA: Diagnosis not present

## 2018-10-15 DIAGNOSIS — E1165 Type 2 diabetes mellitus with hyperglycemia: Secondary | ICD-10-CM | POA: Diagnosis not present

## 2018-10-15 DIAGNOSIS — F341 Dysthymic disorder: Secondary | ICD-10-CM | POA: Diagnosis not present

## 2018-10-15 DIAGNOSIS — M6281 Muscle weakness (generalized): Secondary | ICD-10-CM | POA: Diagnosis not present

## 2018-10-15 DIAGNOSIS — E785 Hyperlipidemia, unspecified: Secondary | ICD-10-CM | POA: Diagnosis not present

## 2018-10-16 ENCOUNTER — Telehealth: Payer: Self-pay

## 2018-10-16 NOTE — Telephone Encounter (Signed)
Spoke with patient to regarding disconnected monitor   

## 2018-10-19 DIAGNOSIS — F341 Dysthymic disorder: Secondary | ICD-10-CM | POA: Diagnosis not present

## 2018-10-19 DIAGNOSIS — M6281 Muscle weakness (generalized): Secondary | ICD-10-CM | POA: Diagnosis not present

## 2018-10-19 DIAGNOSIS — E1122 Type 2 diabetes mellitus with diabetic chronic kidney disease: Secondary | ICD-10-CM | POA: Diagnosis not present

## 2018-10-19 DIAGNOSIS — E1165 Type 2 diabetes mellitus with hyperglycemia: Secondary | ICD-10-CM | POA: Diagnosis not present

## 2018-10-19 DIAGNOSIS — E785 Hyperlipidemia, unspecified: Secondary | ICD-10-CM | POA: Diagnosis not present

## 2018-10-19 DIAGNOSIS — N184 Chronic kidney disease, stage 4 (severe): Secondary | ICD-10-CM | POA: Diagnosis not present

## 2018-10-19 DIAGNOSIS — I5033 Acute on chronic diastolic (congestive) heart failure: Secondary | ICD-10-CM | POA: Diagnosis not present

## 2018-10-19 DIAGNOSIS — I482 Chronic atrial fibrillation, unspecified: Secondary | ICD-10-CM | POA: Diagnosis not present

## 2018-10-19 DIAGNOSIS — I110 Hypertensive heart disease with heart failure: Secondary | ICD-10-CM | POA: Diagnosis not present

## 2018-10-20 DIAGNOSIS — I5033 Acute on chronic diastolic (congestive) heart failure: Secondary | ICD-10-CM | POA: Diagnosis not present

## 2018-10-20 DIAGNOSIS — E785 Hyperlipidemia, unspecified: Secondary | ICD-10-CM | POA: Diagnosis not present

## 2018-10-20 DIAGNOSIS — M6281 Muscle weakness (generalized): Secondary | ICD-10-CM | POA: Diagnosis not present

## 2018-10-20 DIAGNOSIS — F341 Dysthymic disorder: Secondary | ICD-10-CM | POA: Diagnosis not present

## 2018-10-20 DIAGNOSIS — E1122 Type 2 diabetes mellitus with diabetic chronic kidney disease: Secondary | ICD-10-CM | POA: Diagnosis not present

## 2018-10-20 DIAGNOSIS — E1165 Type 2 diabetes mellitus with hyperglycemia: Secondary | ICD-10-CM | POA: Diagnosis not present

## 2018-10-20 DIAGNOSIS — N184 Chronic kidney disease, stage 4 (severe): Secondary | ICD-10-CM | POA: Diagnosis not present

## 2018-10-20 DIAGNOSIS — I110 Hypertensive heart disease with heart failure: Secondary | ICD-10-CM | POA: Diagnosis not present

## 2018-10-20 DIAGNOSIS — I482 Chronic atrial fibrillation, unspecified: Secondary | ICD-10-CM | POA: Diagnosis not present

## 2018-10-20 NOTE — Progress Notes (Addendum)
Cardiology Office Note Date:  10/22/2018  Patient ID:  Jaye, Saal 05/03/1931, MRN [id number] PCP:  Asencion Noble, MD  Electrophysiologist: Dr. Caryl Comes    Chief Complaint: post hospital visit, Afib  History of Present Illness: GENIENE LIST is a 85 y.o. female with history of HTN, DM, CRI (III), orthostatic hypotension stroke > loop implant >> paroxysmal Afib.  She comes in today to be seen for Dr. Caryl Comes, last seen by him April 2019, she was doing well, some orthostatic dizziness, losartan was reduced, discussed safety measures and compressive clothing.  More recently she was hospitalized, initially went to [facility] 09/28/2018 with fatigue , weakness, she was found hypotensive and in rapid afib with no sense of palpitations.  Initially in the ER her labs notable for WBC 17.5, otherwise fairly unremarkable, CXR with some congestion.  IVF and dilt gtt given though eventually transitioned to lasix and PO dilt.  She was also started on abx for possible UTI.   Cardiology participated in her stay.    She was in/out of SR initially with short post conversion pauses <2 seconds, and asymptomatic.  TTE was updated with LVEF 55-60%, indeterminate diastolic function, mod MS.   LA was described as severely enlarged, measured 3.75cm.  She ultimately ended up on amiodarone 200mg  BID x2 weeks and then daily, dilt 300mg  daily.  Was noted she perhaps misses doses of her medicines, and encouraged/educated on this particularly with her eliquis.  She was diuresed as well.  She was discharged 10/06/2018 not felt to have had any infection, perhaps WBC stress demargination.  The patient comes accompanied by her grand daughter.  She has done well since her discharge.  Tolerating the medicines without difficulty.  She mentions a constant generalized vague feeling of dizziness.  This is an all day, every day feeling that goes back at least a year, perhaps longer.  It does not change in any way with position change,  exertion, or rest, it is just "always there".  She has no h/o syncope.  The day of her hospital admission she was quite weak and had a fall though no syncope.  None further. She denies CP, palpitations, no SOB.  Walks with a walker and is at her baseline exertional capacity.  She denies any bleeding or signs of bleeding, reminded of the importance of taking her medicines   Device information: MDT ILR, implanted 06/07/15, cryptogenic stroke   Past Medical History:  Diagnosis Date   Chronic anticoagulation    Depression    Diabetes mellitus    Hyperlipidemia    Hypertension    PAF (paroxysmal atrial fibrillation) (HCC)    PFO (patent foramen ovale)    Stroke ([place])    TIA's x 2    Past Surgical History:  Procedure Laterality Date   COLONOSCOPY     COLONOSCOPY N/A 09/09/2014   Procedure: COLONOSCOPY;  Surgeon: Rogene Houston, MD;  Location[facility];  Service: Endoscopy;  Laterality: N/A;  1010 - moved to 7:30 - Ann to notify   EP IMPLANTABLE DEVICE N/A 06/07/2015   Procedure: Loop Recorder Insertion;  Surgeon: Deboraha Sprang, MD;  Location: [place] CV LAB;  Service: Cardiovascular;  Laterality: N/A;   TEE WITHOUT CARDIOVERSION N/A 06/07/2015   Procedure: TRANSESOPHAGEAL ECHOCARDIOGRAM (TEE);  Surgeon: Thayer Headings, MD;  Location: [facility] ENDOSCOPY;  Service: Cardiovascular;  Laterality: N/A;    Current Outpatient Medications  Medication Sig Dispense Refill   acetaminophen (TYLENOL) 500 MG tablet Take  1,000 mg by mouth at bedtime. Take 2 tablets (1000 mg) by mouth daily at bedtime, may also take 2 tablets during the day as needed for pain     amiodarone (PACERONE) 200 MG tablet Take 1 tablet (200 mg total) by mouth daily. 90 tablet 3   atorvastatin (LIPITOR) 20 MG tablet Take 2 tablets (40 mg total) by mouth every evening. 4pm (Patient taking differently: Take 20 mg by mouth at bedtime. ) 60 tablet 0   buPROPion (WELLBUTRIN XL) 300 MG 24 hr tablet Take 300 mg by  mouth daily.     citalopram (CELEXA) 20 MG tablet Take 20 mg by mouth daily.      diltiazem (CARDIZEM CD) 300 MG 24 hr capsule Take 1 capsule (300 mg total) by mouth daily. 30 capsule 1   ELIQUIS 2.5 MG TABS tablet Take 2.5 mg by mouth 2 (two) times daily.      furosemide (LASIX) 40 MG tablet Take 1 tablet (40 mg total) by mouth daily. 30 tablet 0   Ginkgo Biloba 120 MG CAPS Take 120 mg by mouth daily.     LANTUS SOLOSTAR 100 UNIT/ML Solostar Pen Inject 44 Units into the skin daily.      losartan (COZAAR) 100 MG tablet Take 100 mg by mouth daily.     Magnesium Oxide 250 MG TABS Take 500 mg by mouth every evening. 500mg  tablets on hand     Multiple Vitamin (MULTIVITAMIN WITH MINERALS) TABS tablet Take 1 tablet by mouth at bedtime. For supplement     Multiple Vitamins-Minerals (PRESERVISION AREDS 2) CAPS Take 1 capsule by mouth 2 (two) times daily.     pantoprazole (PROTONIX) 40 MG tablet Take 40 mg by mouth daily. For acid reflux/gerd     potassium chloride SA (K-DUR) 20 MEQ tablet Take 2 tablets (40 mEq total) by mouth daily. 30 tablet 0   traZODone (DESYREL) 100 MG tablet Take 150 mg by mouth at bedtime.      No current facility-administered medications for this visit.     Allergies:   Patient has no known allergies.   Social History:  The patient  reports that she has never smoked. She has never used smokeless tobacco. She reports that she does not drink alcohol or use drugs.   Family History:  The patient's family history includes Dementia in her sister; Stroke in her father.  ROS:  Please see the history of present illness.    All other systems are reviewed and otherwise negative.   PHYSICAL EXAM:  VS:  BP (!) 126/54    Pulse 86    Ht 5\' 4"  (1.626 m)    Wt 206 lb (93.4 kg)    BMI 35.36 kg/m  BMI: Body mass index is 35.36 kg/m. Well nourished, well developed, in no acute distress  HEENT: normocephalic, atraumatic  Neck: no JVD, carotid bruits or masses Cardiac:  RRR;  no significant murmurs, no rubs, or gallops Lungs:  CTA b/l, no wheezing, rhonchi or rales  Abd: soft, nontender MS: no deformity, age appropriate atrophy Ext: no edema  Skin: warm and dry, no rash Neuro:  No gross deficits appreciated Psych: euthymic mood, full affect  ILR site is stable, no tethering or discomfort   EKG:  Not done today ILR interrogation done today and reviewed by myself; Battery is good, R wave 0.25, no AFib since her hospital stay (noting the RVR associated with her hospitalization)    09/29/2018: TTE IMPRESSIONS 1. The left ventricle has  normal systolic function, with an ejection fraction of 55-60%. The cavity size was normal. There is moderately increased left ventricular wall thickness. Left ventricular diastolic Doppler parameters are indeterminate.  2. Left atrial size was severely dilated.  3. The aortic valve is tricuspid. Mild thickening of the aortic valve. Mild calcification of the aortic valve. No stenosis of the aortic valve. Mild aortic annular calcification noted.  4. The mitral valve is abnormal. Moderate thickening of the mitral valve leaflet. Moderate calcification of the mitral valve leaflet. There is moderate mitral annular calcification present. moderate gradient across the mitral valve, mean gradient 43mmHg.  The MVA by PHT appears inaccurate, gradient would support moderate mitral stenosis mitral valve stenosis.  5. The aorta is normal in size and structure.  6. The aortic root is normal in size and structure.  7. Pulmonary hypertension is indeterminate, inadequate TR jet.  8. The interatrial septum was not well visualized.  FINDINGS  Left Ventricle: The left ventricle has normal systolic function, with an ejection fraction of 55-60%. The cavity size was normal. There is moderately increased left ventricular wall thickness. Left ventricular diastolic Doppler parameters are  indeterminate. Definity contrast agent was given IV to delineate the  left ventricular endocardial borders.  Right Ventricle: The right ventricle was not well visualized. The cavity was not assessed. There is right vetricular wall thickness was not assessed. RV poorly visualized, grossly appears normal in size and function.  Left Atrium: Left atrial size was severely dilated.  Right Atrium: Right atrial size was not well visualized. Right atrial pressure is estimated at 10 mmHg.  Interatrial Septum: The interatrial septum was not well visualized.  Pericardium: There is no evidence of pericardial effusion.  Mitral Valve: The mitral valve is abnormal. Moderate thickening of the mitral valve leaflet. Moderate calcification of the mitral valve leaflet. There is moderate mitral annular calcification present. Mitral valve regurgitation is not visualized by color  flow Doppler. moderate gradient across the mitral valve, mean gradient 30mmHg. The MVA by PHT appears inaccurate, gradient would support moderate mitral stenosis mitral valve stenosis.  Tricuspid Valve: The tricuspid valve is not well visualized. Tricuspid valve regurgitation was not visualized by color flow Doppler.  Aortic Valve: The aortic valve is tricuspid Mild thickening of the aortic valve. Mild calcification of the aortic valve. Aortic valve regurgitation was not visualized by color flow Doppler. There is No stenosis of the aortic valve, with a calculated  valve area of 1.82 cm. Mild aortic annular calcification noted.  Pulmonic Valve: The pulmonic valve was not well visualized. Pulmonic valve regurgitation is not visualized by color flow Doppler. No evidence of pulmonic stenosis.  Aorta: The aortic root is normal in size and structure. The aorta is normal in size and structure.  Pulmonary Artery: Pulmonary hypertension is indeterminate, inadequate TR jet.  Venous: The inferior vena cava is normal in size with greater than 50% respiratory variability.      Recent Labs: 09/28/2018:  B Natriuretic Peptide 1,053.0; TSH 0.828 10/05/2018: Hemoglobin 10.8; Platelets 304 10/06/2018: BUN 42; Creatinine, Ser 1.80; Magnesium 2.0; Potassium 4.0; Sodium 141  No results found for requested labs within last 8760 hours.   Estimated Creatinine Clearance: 25.8 mL/min (A) (by C-G formula based on SCr of 1.8 mg/dL (H)).   Wt Readings from Last 3 Encounters:  10/22/18 206 lb (93.4 kg)  09/28/18 200 lb (90.7 kg)  06/10/17 207 lb (93.9 kg)     Other studies reviewed: Additional studies/records reviewed today include: summarized above  ASSESSMENT AND PLAN:  1. Paroxysmal Afib     CHA2DS2Vasc is 7, on Eliquis, appropriately dosed (her Creat appears generally >1.5     0.3% burden  Now on dilt and amio for rate control She had brief post termination pauses noted during her stay, now with the meds on board programmed brady and pause >> on  TSH was wnl a couple weeks ago, will follow Get baseline LFTs today BMET for her eliquis as well, renal function   2. HTN     Looks good, no changes today  3. HFpEF     No symptoms or exam findings to suggest ongoing/recurrent fluid OL     Will check BMET today      Disposition: F/u with monthly loop reports, and 6 months otherwise    Current medicines are reviewed at length with the patient today.  The patient did not have any concerns regarding medicines.  Venetia Night, PA-C 10/22/2018 11:32 AM     [address] [place] [phone] (office)  [phone] (fax)

## 2018-10-21 DIAGNOSIS — F341 Dysthymic disorder: Secondary | ICD-10-CM | POA: Diagnosis not present

## 2018-10-21 DIAGNOSIS — I110 Hypertensive heart disease with heart failure: Secondary | ICD-10-CM | POA: Diagnosis not present

## 2018-10-21 DIAGNOSIS — E1165 Type 2 diabetes mellitus with hyperglycemia: Secondary | ICD-10-CM | POA: Diagnosis not present

## 2018-10-21 DIAGNOSIS — E785 Hyperlipidemia, unspecified: Secondary | ICD-10-CM | POA: Diagnosis not present

## 2018-10-21 DIAGNOSIS — E1122 Type 2 diabetes mellitus with diabetic chronic kidney disease: Secondary | ICD-10-CM | POA: Diagnosis not present

## 2018-10-21 DIAGNOSIS — I5033 Acute on chronic diastolic (congestive) heart failure: Secondary | ICD-10-CM | POA: Diagnosis not present

## 2018-10-21 DIAGNOSIS — I482 Chronic atrial fibrillation, unspecified: Secondary | ICD-10-CM | POA: Diagnosis not present

## 2018-10-21 DIAGNOSIS — N184 Chronic kidney disease, stage 4 (severe): Secondary | ICD-10-CM | POA: Diagnosis not present

## 2018-10-21 DIAGNOSIS — M6281 Muscle weakness (generalized): Secondary | ICD-10-CM | POA: Diagnosis not present

## 2018-10-22 ENCOUNTER — Encounter: Payer: Self-pay | Admitting: Physician Assistant

## 2018-10-22 ENCOUNTER — Ambulatory Visit (INDEPENDENT_AMBULATORY_CARE_PROVIDER_SITE_OTHER): Payer: Medicare HMO | Admitting: Physician Assistant

## 2018-10-22 ENCOUNTER — Other Ambulatory Visit: Payer: Self-pay

## 2018-10-22 VITALS — BP 126/54 | HR 86 | Ht 64.0 in | Wt 206.0 lb

## 2018-10-22 DIAGNOSIS — I10 Essential (primary) hypertension: Secondary | ICD-10-CM | POA: Diagnosis not present

## 2018-10-22 DIAGNOSIS — Z79899 Other long term (current) drug therapy: Secondary | ICD-10-CM | POA: Diagnosis not present

## 2018-10-22 DIAGNOSIS — I480 Paroxysmal atrial fibrillation: Secondary | ICD-10-CM

## 2018-10-22 DIAGNOSIS — I5032 Chronic diastolic (congestive) heart failure: Secondary | ICD-10-CM | POA: Diagnosis not present

## 2018-10-22 DIAGNOSIS — I509 Heart failure, unspecified: Secondary | ICD-10-CM | POA: Diagnosis not present

## 2018-10-22 LAB — HEPATIC FUNCTION PANEL
ALT: 15 IU/L (ref 0–32)
AST: 16 IU/L (ref 0–40)
Albumin: 3.9 g/dL (ref 3.6–4.6)
Alkaline Phosphatase: 110 IU/L (ref 39–117)
Bilirubin Total: 0.2 mg/dL (ref 0.0–1.2)
Bilirubin, Direct: 0.08 mg/dL (ref 0.00–0.40)
Total Protein: 5.7 g/dL — ABNORMAL LOW (ref 6.0–8.5)

## 2018-10-22 LAB — BASIC METABOLIC PANEL
BUN/Creatinine Ratio: 16 (ref 12–28)
BUN: 32 mg/dL — ABNORMAL HIGH (ref 8–27)
CO2: 27 mmol/L (ref 20–29)
Calcium: 10 mg/dL (ref 8.7–10.3)
Chloride: 95 mmol/L — ABNORMAL LOW (ref 96–106)
Creatinine, Ser: 1.96 mg/dL — ABNORMAL HIGH (ref 0.57–1.00)
GFR calc Af Amer: 26 mL/min/{1.73_m2} — ABNORMAL LOW (ref >59)
GFR calc non Af Amer: 23 mL/min/{1.73_m2} — ABNORMAL LOW (ref >59)
Glucose: 288 mg/dL — ABNORMAL HIGH (ref 65–99)
Potassium: 4.8 mmol/L (ref 3.5–5.2)
Sodium: 138 mmol/L (ref 134–144)

## 2018-10-22 MED ORDER — AMIODARONE HCL 200 MG PO TABS: 200.0000 mg | ORAL_TABLET | Freq: Every day | ORAL | 3 refills | Status: DC

## 2018-10-22 NOTE — Patient Instructions (Signed)
Medication Instructions:   Your physician recommends that you continue on your current medications as directed. Please refer to the Current Medication list given to you today. If you need a refill on your cardiac medications before your next appointment, please call your pharmacy.   Lab work: BMET AND LFT  TODAY    If you have labs (blood work) drawn today and your tests are completely normal, you will receive your results only by: Marland Kitchen MyChart Message (if you have MyChart) OR . A paper copy in the mail If you have any lab test that is abnormal or we need to change your treatment, we will call you to review the results.  Testing/Procedures: NONE ORDERED  TODAY  Follow-Up: At [facility], you and your health needs are our priority.  As part of our continuing mission to provide you with exceptional heart care, we have created designated Provider Care Teams.  These Care Teams include your primary Cardiologist (physician) and Advanced Practice Providers (APPs -  Physician Assistants and Nurse Practitioners) who all work together to provide you with the care you need, when you need it. You will need a follow up appointment in 6 months.  Please call our office 2 months in advance to schedule this appointment.  You may see Dr. Caryl Comes  or one of the following Advanced Practice Providers on your designated Care Team:  Tommye Standard, PA-C  Any Other Special Instructions Will Be Listed Below (If Applicable).

## 2018-10-23 ENCOUNTER — Other Ambulatory Visit: Payer: Self-pay | Admitting: *Deleted

## 2018-10-23 DIAGNOSIS — Z79899 Other long term (current) drug therapy: Secondary | ICD-10-CM

## 2018-10-26 DIAGNOSIS — M6281 Muscle weakness (generalized): Secondary | ICD-10-CM | POA: Diagnosis not present

## 2018-10-26 DIAGNOSIS — N184 Chronic kidney disease, stage 4 (severe): Secondary | ICD-10-CM | POA: Diagnosis not present

## 2018-10-26 DIAGNOSIS — E1122 Type 2 diabetes mellitus with diabetic chronic kidney disease: Secondary | ICD-10-CM | POA: Diagnosis not present

## 2018-10-26 DIAGNOSIS — E1165 Type 2 diabetes mellitus with hyperglycemia: Secondary | ICD-10-CM | POA: Diagnosis not present

## 2018-10-26 DIAGNOSIS — I482 Chronic atrial fibrillation, unspecified: Secondary | ICD-10-CM | POA: Diagnosis not present

## 2018-10-26 DIAGNOSIS — E785 Hyperlipidemia, unspecified: Secondary | ICD-10-CM | POA: Diagnosis not present

## 2018-10-26 DIAGNOSIS — I110 Hypertensive heart disease with heart failure: Secondary | ICD-10-CM | POA: Diagnosis not present

## 2018-10-26 DIAGNOSIS — I5033 Acute on chronic diastolic (congestive) heart failure: Secondary | ICD-10-CM | POA: Diagnosis not present

## 2018-10-26 DIAGNOSIS — F341 Dysthymic disorder: Secondary | ICD-10-CM | POA: Diagnosis not present

## 2018-10-29 ENCOUNTER — Encounter: Payer: Self-pay | Admitting: Cardiology

## 2018-10-30 DIAGNOSIS — I5032 Chronic diastolic (congestive) heart failure: Secondary | ICD-10-CM | POA: Diagnosis not present

## 2018-10-30 DIAGNOSIS — I480 Paroxysmal atrial fibrillation: Secondary | ICD-10-CM | POA: Diagnosis not present

## 2018-10-31 DIAGNOSIS — M6281 Muscle weakness (generalized): Secondary | ICD-10-CM | POA: Diagnosis not present

## 2018-10-31 DIAGNOSIS — I110 Hypertensive heart disease with heart failure: Secondary | ICD-10-CM | POA: Diagnosis not present

## 2018-10-31 DIAGNOSIS — I482 Chronic atrial fibrillation, unspecified: Secondary | ICD-10-CM | POA: Diagnosis not present

## 2018-10-31 DIAGNOSIS — N184 Chronic kidney disease, stage 4 (severe): Secondary | ICD-10-CM | POA: Diagnosis not present

## 2018-10-31 DIAGNOSIS — F341 Dysthymic disorder: Secondary | ICD-10-CM | POA: Diagnosis not present

## 2018-10-31 DIAGNOSIS — E785 Hyperlipidemia, unspecified: Secondary | ICD-10-CM | POA: Diagnosis not present

## 2018-10-31 DIAGNOSIS — E1165 Type 2 diabetes mellitus with hyperglycemia: Secondary | ICD-10-CM | POA: Diagnosis not present

## 2018-10-31 DIAGNOSIS — E1122 Type 2 diabetes mellitus with diabetic chronic kidney disease: Secondary | ICD-10-CM | POA: Diagnosis not present

## 2018-10-31 DIAGNOSIS — I5033 Acute on chronic diastolic (congestive) heart failure: Secondary | ICD-10-CM | POA: Diagnosis not present

## 2018-11-03 DIAGNOSIS — I110 Hypertensive heart disease with heart failure: Secondary | ICD-10-CM | POA: Diagnosis not present

## 2018-11-03 DIAGNOSIS — N184 Chronic kidney disease, stage 4 (severe): Secondary | ICD-10-CM | POA: Diagnosis not present

## 2018-11-03 DIAGNOSIS — I5033 Acute on chronic diastolic (congestive) heart failure: Secondary | ICD-10-CM | POA: Diagnosis not present

## 2018-11-03 DIAGNOSIS — E1165 Type 2 diabetes mellitus with hyperglycemia: Secondary | ICD-10-CM | POA: Diagnosis not present

## 2018-11-03 DIAGNOSIS — E785 Hyperlipidemia, unspecified: Secondary | ICD-10-CM | POA: Diagnosis not present

## 2018-11-03 DIAGNOSIS — I482 Chronic atrial fibrillation, unspecified: Secondary | ICD-10-CM | POA: Diagnosis not present

## 2018-11-03 DIAGNOSIS — F341 Dysthymic disorder: Secondary | ICD-10-CM | POA: Diagnosis not present

## 2018-11-03 DIAGNOSIS — E1122 Type 2 diabetes mellitus with diabetic chronic kidney disease: Secondary | ICD-10-CM | POA: Diagnosis not present

## 2018-11-03 DIAGNOSIS — M6281 Muscle weakness (generalized): Secondary | ICD-10-CM | POA: Diagnosis not present

## 2018-11-13 ENCOUNTER — Ambulatory Visit (INDEPENDENT_AMBULATORY_CARE_PROVIDER_SITE_OTHER): Payer: Medicare HMO | Admitting: *Deleted

## 2018-11-13 DIAGNOSIS — I639 Cerebral infarction, unspecified: Secondary | ICD-10-CM | POA: Diagnosis not present

## 2018-11-14 LAB — CUP PACEART REMOTE DEVICE CHECK
Date Time Interrogation Session: 20200918183905
Implantable Pulse Generator Implant Date: 20170412

## 2018-11-16 ENCOUNTER — Telehealth: Payer: Self-pay

## 2018-11-16 NOTE — Telephone Encounter (Signed)
I spoke with the pt to get her to send a manual transmission with her home monitor. I will call her back tomorrow to try again.

## 2018-11-16 NOTE — Progress Notes (Signed)
Carelink Summary Report / Loop Recorder 

## 2018-11-17 DIAGNOSIS — M6281 Muscle weakness (generalized): Secondary | ICD-10-CM | POA: Diagnosis not present

## 2018-11-17 DIAGNOSIS — F341 Dysthymic disorder: Secondary | ICD-10-CM | POA: Diagnosis not present

## 2018-11-17 DIAGNOSIS — E1122 Type 2 diabetes mellitus with diabetic chronic kidney disease: Secondary | ICD-10-CM | POA: Diagnosis not present

## 2018-11-17 DIAGNOSIS — N184 Chronic kidney disease, stage 4 (severe): Secondary | ICD-10-CM | POA: Diagnosis not present

## 2018-11-17 DIAGNOSIS — I5033 Acute on chronic diastolic (congestive) heart failure: Secondary | ICD-10-CM | POA: Diagnosis not present

## 2018-11-17 DIAGNOSIS — E785 Hyperlipidemia, unspecified: Secondary | ICD-10-CM | POA: Diagnosis not present

## 2018-11-17 DIAGNOSIS — E1165 Type 2 diabetes mellitus with hyperglycemia: Secondary | ICD-10-CM | POA: Diagnosis not present

## 2018-11-17 DIAGNOSIS — I482 Chronic atrial fibrillation, unspecified: Secondary | ICD-10-CM | POA: Diagnosis not present

## 2018-11-17 DIAGNOSIS — I110 Hypertensive heart disease with heart failure: Secondary | ICD-10-CM | POA: Diagnosis not present

## 2018-11-19 DIAGNOSIS — H353290 Exudative age-related macular degeneration, unspecified eye, stage unspecified: Secondary | ICD-10-CM | POA: Diagnosis not present

## 2018-11-19 DIAGNOSIS — D6869 Other thrombophilia: Secondary | ICD-10-CM | POA: Diagnosis not present

## 2018-11-19 DIAGNOSIS — F339 Major depressive disorder, recurrent, unspecified: Secondary | ICD-10-CM | POA: Diagnosis not present

## 2018-11-19 DIAGNOSIS — E261 Secondary hyperaldosteronism: Secondary | ICD-10-CM | POA: Diagnosis not present

## 2018-11-19 DIAGNOSIS — E1169 Type 2 diabetes mellitus with other specified complication: Secondary | ICD-10-CM | POA: Diagnosis not present

## 2018-11-19 DIAGNOSIS — I110 Hypertensive heart disease with heart failure: Secondary | ICD-10-CM | POA: Diagnosis not present

## 2018-11-19 DIAGNOSIS — I480 Paroxysmal atrial fibrillation: Secondary | ICD-10-CM | POA: Diagnosis not present

## 2018-11-19 DIAGNOSIS — E1165 Type 2 diabetes mellitus with hyperglycemia: Secondary | ICD-10-CM | POA: Diagnosis not present

## 2018-11-19 DIAGNOSIS — I509 Heart failure, unspecified: Secondary | ICD-10-CM | POA: Diagnosis not present

## 2018-11-19 DIAGNOSIS — Z794 Long term (current) use of insulin: Secondary | ICD-10-CM | POA: Diagnosis not present

## 2018-11-23 NOTE — Telephone Encounter (Signed)
I spoke with the pt and she states her granddaughter recently got married and can not help her send a transmission at the moment. I told the pt when her granddaughter come to see her to get her to help her send a manual transmission with her home monitor.

## 2018-12-03 DIAGNOSIS — I5033 Acute on chronic diastolic (congestive) heart failure: Secondary | ICD-10-CM | POA: Diagnosis not present

## 2018-12-03 DIAGNOSIS — M6281 Muscle weakness (generalized): Secondary | ICD-10-CM | POA: Diagnosis not present

## 2018-12-03 DIAGNOSIS — E1122 Type 2 diabetes mellitus with diabetic chronic kidney disease: Secondary | ICD-10-CM | POA: Diagnosis not present

## 2018-12-03 DIAGNOSIS — N184 Chronic kidney disease, stage 4 (severe): Secondary | ICD-10-CM | POA: Diagnosis not present

## 2018-12-03 DIAGNOSIS — I482 Chronic atrial fibrillation, unspecified: Secondary | ICD-10-CM | POA: Diagnosis not present

## 2018-12-03 DIAGNOSIS — F341 Dysthymic disorder: Secondary | ICD-10-CM | POA: Diagnosis not present

## 2018-12-03 DIAGNOSIS — I110 Hypertensive heart disease with heart failure: Secondary | ICD-10-CM | POA: Diagnosis not present

## 2018-12-03 DIAGNOSIS — E1165 Type 2 diabetes mellitus with hyperglycemia: Secondary | ICD-10-CM | POA: Diagnosis not present

## 2018-12-03 DIAGNOSIS — E785 Hyperlipidemia, unspecified: Secondary | ICD-10-CM | POA: Diagnosis not present

## 2018-12-04 NOTE — Telephone Encounter (Signed)
Pt agreed to send a manual transmission with her home monitor when her granddaughter get off work.

## 2018-12-09 NOTE — Telephone Encounter (Signed)
PT tried to send a manual transmission but was unsuccessful. I gave her the number to [place] support to get additional help

## 2018-12-09 NOTE — Telephone Encounter (Signed)
LMOM for patient  and spoke with granddaughter( per DPR ) . Granddaughter to assist patient with remote transmission when they get home from errands.

## 2018-12-10 NOTE — Telephone Encounter (Signed)
Transmission received 12-09-2018

## 2018-12-10 NOTE — Telephone Encounter (Signed)
Transmission reviewed. No new events noted since brief AF episodes on 10/28/18, longest 58min. Pt had OV with Tommye Standard, PA, on 10/22/18.

## 2018-12-16 ENCOUNTER — Ambulatory Visit (INDEPENDENT_AMBULATORY_CARE_PROVIDER_SITE_OTHER): Payer: Medicare HMO | Admitting: *Deleted

## 2018-12-16 DIAGNOSIS — I5031 Acute diastolic (congestive) heart failure: Secondary | ICD-10-CM | POA: Diagnosis not present

## 2018-12-16 DIAGNOSIS — I639 Cerebral infarction, unspecified: Secondary | ICD-10-CM

## 2018-12-17 LAB — CUP PACEART REMOTE DEVICE CHECK
Date Time Interrogation Session: 20201021184000
Implantable Pulse Generator Implant Date: 20170412

## 2018-12-20 LAB — CUP PACEART REMOTE DEVICE CHECK
Date Time Interrogation Session: 20201023211619
Implantable Pulse Generator Implant Date: 20170412

## 2018-12-21 DIAGNOSIS — L821 Other seborrheic keratosis: Secondary | ICD-10-CM | POA: Diagnosis not present

## 2018-12-21 DIAGNOSIS — B078 Other viral warts: Secondary | ICD-10-CM | POA: Diagnosis not present

## 2018-12-29 NOTE — Progress Notes (Signed)
Carelink Summary Report / Loop Recorder 

## 2018-12-30 DIAGNOSIS — E1129 Type 2 diabetes mellitus with other diabetic kidney complication: Secondary | ICD-10-CM | POA: Diagnosis not present

## 2018-12-30 DIAGNOSIS — I5032 Chronic diastolic (congestive) heart failure: Secondary | ICD-10-CM | POA: Diagnosis not present

## 2018-12-30 DIAGNOSIS — G4700 Insomnia, unspecified: Secondary | ICD-10-CM | POA: Diagnosis not present

## 2018-12-30 DIAGNOSIS — I480 Paroxysmal atrial fibrillation: Secondary | ICD-10-CM | POA: Diagnosis not present

## 2019-01-18 ENCOUNTER — Ambulatory Visit (INDEPENDENT_AMBULATORY_CARE_PROVIDER_SITE_OTHER): Payer: Medicare HMO | Admitting: *Deleted

## 2019-01-18 DIAGNOSIS — I639 Cerebral infarction, unspecified: Secondary | ICD-10-CM | POA: Diagnosis not present

## 2019-01-19 LAB — CUP PACEART REMOTE DEVICE CHECK
Date Time Interrogation Session: 20201124071734
Implantable Pulse Generator Implant Date: 20170412

## 2019-01-27 ENCOUNTER — Telehealth: Payer: Self-pay | Admitting: *Deleted

## 2019-01-27 NOTE — Telephone Encounter (Signed)
LMOM requesting call back to DC. Direct number and office hours provided.   LINQ at RRT as of 01/20/19.

## 2019-01-29 NOTE — Telephone Encounter (Signed)
I let the pt granddaughter know she has reach RRT. I let her know that the pt can leave the loop in and it will not hurt her. I also told her that if she wants it removed we can make an appointment with her Doctor to have it removed. I told her I will send a return kit for her to mail in her monitor.

## 2019-01-29 NOTE — Telephone Encounter (Signed)
Spoke Ms. Wheeler(grandaughter) per DPR and patient. Patient does not want to have loop recorder extracted and will ensure monitor I unplugged. Will contact office with any further questions or if she decides to have extraction of LINQ.

## 2019-02-14 NOTE — Addendum Note (Signed)
Addended by: Patsey Berthold on: 02/14/2019 08:20 PM   Modules accepted: Level of Service

## 2019-02-25 DIAGNOSIS — I5032 Chronic diastolic (congestive) heart failure: Secondary | ICD-10-CM | POA: Diagnosis not present

## 2019-02-25 DIAGNOSIS — Z79899 Other long term (current) drug therapy: Secondary | ICD-10-CM | POA: Diagnosis not present

## 2019-02-25 DIAGNOSIS — I480 Paroxysmal atrial fibrillation: Secondary | ICD-10-CM | POA: Diagnosis not present

## 2019-02-25 DIAGNOSIS — E1129 Type 2 diabetes mellitus with other diabetic kidney complication: Secondary | ICD-10-CM | POA: Diagnosis not present

## 2019-03-04 DIAGNOSIS — E1122 Type 2 diabetes mellitus with diabetic chronic kidney disease: Secondary | ICD-10-CM | POA: Diagnosis not present

## 2019-03-04 DIAGNOSIS — F339 Major depressive disorder, recurrent, unspecified: Secondary | ICD-10-CM | POA: Diagnosis not present

## 2019-03-04 DIAGNOSIS — N184 Chronic kidney disease, stage 4 (severe): Secondary | ICD-10-CM | POA: Diagnosis not present

## 2019-03-04 DIAGNOSIS — I5032 Chronic diastolic (congestive) heart failure: Secondary | ICD-10-CM | POA: Diagnosis not present

## 2019-06-11 DIAGNOSIS — Z79899 Other long term (current) drug therapy: Secondary | ICD-10-CM | POA: Diagnosis not present

## 2019-06-11 DIAGNOSIS — E1129 Type 2 diabetes mellitus with other diabetic kidney complication: Secondary | ICD-10-CM | POA: Diagnosis not present

## 2019-06-11 DIAGNOSIS — I5032 Chronic diastolic (congestive) heart failure: Secondary | ICD-10-CM | POA: Diagnosis not present

## 2019-06-11 DIAGNOSIS — N184 Chronic kidney disease, stage 4 (severe): Secondary | ICD-10-CM | POA: Diagnosis not present

## 2019-06-11 DIAGNOSIS — F329 Major depressive disorder, single episode, unspecified: Secondary | ICD-10-CM | POA: Diagnosis not present

## 2019-06-18 DIAGNOSIS — E1122 Type 2 diabetes mellitus with diabetic chronic kidney disease: Secondary | ICD-10-CM | POA: Diagnosis not present

## 2019-06-18 DIAGNOSIS — Z6837 Body mass index (BMI) 37.0-37.9, adult: Secondary | ICD-10-CM | POA: Diagnosis not present

## 2019-06-18 DIAGNOSIS — N184 Chronic kidney disease, stage 4 (severe): Secondary | ICD-10-CM | POA: Diagnosis not present

## 2019-06-18 DIAGNOSIS — I5032 Chronic diastolic (congestive) heart failure: Secondary | ICD-10-CM | POA: Diagnosis not present

## 2019-06-18 DIAGNOSIS — E785 Hyperlipidemia, unspecified: Secondary | ICD-10-CM | POA: Diagnosis not present

## 2019-06-23 ENCOUNTER — Telehealth: Payer: Self-pay | Admitting: Internal Medicine

## 2019-06-23 NOTE — Telephone Encounter (Signed)
Amy Wiggins, Granddaughter of the patient called. There has been some confusion with the patient's PCP as to whether or not the patient needs to continue following up with cardiology.  The Granddaughter states that because the patient is not actively using her loop recorder any more, there is no need for her to follow up with Cardiology. The PCP states that because the patient is on amiodarone (PACERONE) 200 MG tablet, the patient still needs to follow up with Cardiology. The Granddaughter would like Dr. Caryl Comes to clarify with Dr. Asencion Noble to be on the same page  Please let the Granddaughter know what to do once both Doctors are on the same page.

## 2019-06-24 NOTE — Telephone Encounter (Signed)
Attempted return phone call, message left to contact RN at [phone].

## 2019-06-29 ENCOUNTER — Telehealth: Payer: Self-pay | Admitting: Internal Medicine

## 2019-06-29 NOTE — Telephone Encounter (Signed)
New message   Patient's granddaughter states that she is returning call from last week to [facility]. Please call.

## 2019-07-06 NOTE — Telephone Encounter (Signed)
Pt's granddaughter has been contacted appointment scheduled.  See previous encounter.

## 2019-07-06 NOTE — Telephone Encounter (Signed)
Spoke with pt's granddaughter, [name] (DPR) and advised pt will still need to be followed by cardiology d/t Amiodarone and history of AFIB.  Appointment scheduled with Tommye Standard, PA for 07/09/2019 at 230pm.  Granddaughter verbalizes understanding and agrees with current plan.

## 2019-07-09 ENCOUNTER — Other Ambulatory Visit: Payer: Self-pay

## 2019-07-09 ENCOUNTER — Ambulatory Visit: Payer: Medicare HMO | Admitting: Physician Assistant

## 2019-07-09 VITALS — BP 100/42 | HR 81 | Ht 64.0 in | Wt 188.0 lb

## 2019-07-09 DIAGNOSIS — Z79899 Other long term (current) drug therapy: Secondary | ICD-10-CM | POA: Diagnosis not present

## 2019-07-09 DIAGNOSIS — I10 Essential (primary) hypertension: Secondary | ICD-10-CM | POA: Diagnosis not present

## 2019-07-09 DIAGNOSIS — R011 Cardiac murmur, unspecified: Secondary | ICD-10-CM | POA: Diagnosis not present

## 2019-07-09 DIAGNOSIS — I480 Paroxysmal atrial fibrillation: Secondary | ICD-10-CM | POA: Diagnosis not present

## 2019-07-09 DIAGNOSIS — I5030 Unspecified diastolic (congestive) heart failure: Secondary | ICD-10-CM

## 2019-07-09 MED ORDER — LOSARTAN POTASSIUM 25 MG PO TABS: 25.0000 mg | ORAL_TABLET | Freq: Every day | ORAL | 1 refills | Status: DC

## 2019-07-09 NOTE — Patient Instructions (Signed)
Medication Instructions:   START TAKING LOSARTAN 25 MG ONCE A  DAY   *If you need a refill on your cardiac medications before your next appointment, please call your pharmacy*  Lab Work:  BMET LIPIDS CBC AND TSH TODAY   If you have labs (blood work) drawn today and your tests are completely normal, you will receive your results only by: Marland Kitchen MyChart Message (if you have MyChart) OR . A paper copy in the mail If you have any lab test that is abnormal or we need to change your treatment, we will call you to review the results.   Testing/Procedures: NONE ORDERED  TODAY   Follow-Up: At [facility], you and your health needs are our priority.  As part of our continuing mission to provide you with exceptional heart care, we have created designated Provider Care Teams.  These Care Teams include your primary Cardiologist (physician) and Advanced Practice Providers (APPs -  Physician Assistants and Nurse Practitioners) who all work together to provide you with the care you need, when you need it.  We recommend signing up for the patient portal called "MyChart".  Sign up information is provided on this After Visit Summary.  MyChart is used to connect with patients for Virtual Visits (Telemedicine).  Patients are able to view lab/test results, encounter notes, upcoming appointments, etc.  Non-urgent messages can be sent to your provider as well.   To learn more about what you can do with MyChart, go to [email or web address].    Your next appointment:   6 month(s)  The format for your next appointment:   In Person  Provider:   You may see Dr.Klein  or one of the following Advanced Practice Providers on your designated Care Team:    Chanetta Marshall, NP  Tommye Standard, PA-C  Legrand Como "Oda Kilts, [place]    Other Instructions

## 2019-07-09 NOTE — Progress Notes (Signed)
Cardiology Office Note Date:  07/09/2019  Patient ID:  Amy, Wiggins [birth year], MRN [id number] PCP:  Asencion Noble, MD  Electrophysiologist: Dr. Caryl Comes    Chief Complaint: 6 mo f/u  History of Present Illness: Amy Wiggins is a 86 y.o. female with history of HTN, DM, CRI (III), orthostatic hypotension stroke > loop implant >> paroxysmal Afib.  She saw Dr. Caryl Comes April 2019, she was doing well, some orthostatic dizziness, losartan was reduced, discussed safety measures and compressive clothing.   She was hospitalized, initially to [facility] 09/28/2018 with fatigue , weakness, she was found hypotensive and in rapid afib with no sense of palpitations.  Initially in the ER her labs notable for WBC 17.5, otherwise fairly unremarkable, CXR with some congestion.  IVF and dilt gtt given though eventually transitioned to lasix and PO dilt.  She was also started on abx for possible UTI.   Cardiology participated in her stay.   She was in/out of SR initially with short post conversion pauses <2 seconds, and asymptomatic.  TTE was updated with LVEF 55-60%, indeterminate diastolic function, mod MS.   LA was described as severely enlarged, measured 3.75cm.  She ultimately ended up on amiodarone 242m BID x2 weeks and then daily, dilt 3065mdaily.  Was noted she perhaps misses doses of her medicines, and encouraged/educated on this particularly with her eliquis.  She was diuresed as well.  She was discharged 10/06/2018 not felt to have had any infection, perhaps WBC stress demargination.  I saw her Aug 2020, she was accompanied by her grand daughter.  She had done well since her discharge.  Tolerating the medicines without difficulty.  She mentioned a constant generalized vague feeling of dizziness.  This is an all day, every day feeling that goes back at least a year, perhaps longer.  It did not change in any way with position change, exertion, or rest, it is just "always there".  She had no h/o syncope.   The day of her hospital admission she was quite weak and had a fall though no syncope.  None further. She denied CP, palpitations, no SOB.  Walking with a walker and is at her baseline exertional capacity. She denies any bleeding or signs of bleeding, reminded of the importance of taking her medicines  No changes were made, planned to follow up on labs Creat was 1.9, her losartan reduced planned for f/u BMET, not done, LFTs OK, no labs from PMD noted  TODAY She is accompanied by her granddaughter that she lives with and helps with medicines and care.  She reports doing "OK".  No dizziness, no near syncope or syncope sine our last visit.  She remains generally without energy, some days worse then others.  Again, just never feels like she has much energy. No CP, palpitations.  She does not think she has had any abnormal rhythm.  No rest SOB.  She is very sedentary, ambulates in the home with walker, no overt DOE.  She will infrequently have a tinge of blood on her tooth brush, no other bleeding or signs of bleeding.  She is compliant with her medicines, her grad daughter keeps up well with this.   Device information: MDT ILR, implanted 06/07/15, cryptogenic stroke Reached RRT 01/20/2019, unable to interrogate today   Past Medical History:  Diagnosis Date  . Chronic anticoagulation   . Depression   . Diabetes mellitus   . Hyperlipidemia   . Hypertension   . PAF (paroxysmal atrial fibrillation) (  [place])   . PFO (patent foramen ovale)   . Stroke ([place])    TIA's x 2    Past Surgical History:  Procedure Laterality Date  . COLONOSCOPY    . COLONOSCOPY N/A 09/09/2014   Procedure: COLONOSCOPY;  Surgeon: Rogene Houston, MD;  Location[facility];  Service: Endoscopy;  Laterality: N/A;  1010 - moved to 7:30 - Ann to notify  . EP IMPLANTABLE DEVICE N/A 06/07/2015   Procedure: Loop Recorder Insertion;  Surgeon: Deboraha Sprang, MD;  Location: [place] CV LAB;  Service: Cardiovascular;   Laterality: N/A;  . TEE WITHOUT CARDIOVERSION N/A 06/07/2015   Procedure: TRANSESOPHAGEAL ECHOCARDIOGRAM (TEE);  Surgeon: Thayer Headings, MD;  Location: [facility] ENDOSCOPY;  Service: Cardiovascular;  Laterality: N/A;    Current Outpatient Medications  Medication Sig Dispense Refill  . acetaminophen (TYLENOL) 500 MG tablet Take 1,000 mg by mouth at bedtime. Take 2 tablets (1000 mg) by mouth daily at bedtime, may also take 2 tablets during the day as needed for pain    . amiodarone (PACERONE) 200 MG tablet Take 1 tablet (200 mg total) by mouth daily. 90 tablet 3  . atorvastatin (LIPITOR) 20 MG tablet Take 2 tablets (40 mg total) by mouth every evening. 4pm (Patient taking differently: Take 20 mg by mouth at bedtime. ) 60 tablet 0  . buPROPion (WELLBUTRIN XL) 300 MG 24 hr tablet Take 300 mg by mouth daily.    . citalopram (CELEXA) 20 MG tablet Take 20 mg by mouth daily.     Marland Kitchen diltiazem (CARDIZEM CD) 300 MG 24 hr capsule Take 1 capsule (300 mg total) by mouth daily. 30 capsule 1  . ELIQUIS 2.5 MG TABS tablet Take 2.5 mg by mouth 2 (two) times daily.     . furosemide (LASIX) 40 MG tablet Take 1 tablet (40 mg total) by mouth daily. 30 tablet 0  . Ginkgo Biloba 120 MG CAPS Take 120 mg by mouth daily.    Marland Kitchen LANTUS SOLOSTAR 100 UNIT/ML Solostar Pen Inject 44 Units into the skin daily.     Marland Kitchen losartan (COZAAR) 25 MG tablet Take 1 tablet (25 mg total) by mouth daily. 90 tablet 1  . Magnesium Oxide 250 MG TABS Take 500 mg by mouth every evening. 540m tablets on hand    . Multiple Vitamin (MULTIVITAMIN WITH MINERALS) TABS tablet Take 1 tablet by mouth at bedtime. For supplement    . Multiple Vitamins-Minerals (PRESERVISION AREDS 2) CAPS Take 1 capsule by mouth 2 (two) times daily.    . pantoprazole (PROTONIX) 40 MG tablet Take 40 mg by mouth daily. For acid reflux/gerd    . potassium chloride SA (K-DUR) 20 MEQ tablet Take 2 tablets (40 mEq total) by mouth daily. 30 tablet 0  . traZODone (DESYREL) 100 MG tablet  Take 150 mg by mouth at bedtime.     . mirtazapine (REMERON) 15 MG tablet Take 15 mg by mouth daily.     No current facility-administered medications for this visit.    Allergies:   Patient has no known allergies.   Social History:  The patient  reports that she has never smoked. She has never used smokeless tobacco. She reports that she does not drink alcohol or use drugs.   Family History:  The patient's family history includes Dementia in her sister; Stroke in her father.  ROS:  Please see the history of present illness.    All other systems are reviewed and otherwise negative.  PHYSICAL EXAM:  VS:  BP (!) 100/42   Pulse 81   Ht _0  (1.626 m)   Wt 188 lb (85.3 kg)   SpO2 96%   BMI 32.27 kg/m  BMI: Body mass index is 32.27 kg/m. Well nourished, well developed, in no acute distress  HEENT: normocephalic, atraumatic  Neck: no JVD, carotid bruits or masses Cardiac:  RRR; 2/4 DM, no rubs, or gallops Lungs:  CTA b/l, no wheezing, rhonchi or rales  Abd: soft, nontender MS: no deformity, age appropriate atrophy Ext: no edema  Skin: warm and dry, no rash Neuro:  No gross deficits appreciated Psych: euthymic mood, full affect  ILR site is stable, no tethering or discomfort   EKG:  Not done today ILR interrogation done today and reviewed by myself; I am unable to interrogate today    09/29/2018: TTE IMPRESSIONS 1. The left ventricle has normal systolic function, with an ejection fraction of 55-60%. The cavity size was normal. There is moderately increased left ventricular wall thickness. Left ventricular diastolic Doppler parameters are indeterminate.  2. Left atrial size was severely dilated.  3. The aortic valve is tricuspid. Mild thickening of the aortic valve. Mild calcification of the aortic valve. No stenosis of the aortic valve. Mild aortic annular calcification noted.  4. The mitral valve is abnormal. Moderate thickening of the mitral valve leaflet. Moderate  calcification of the mitral valve leaflet. There is moderate mitral annular calcification present. moderate gradient across the mitral valve, mean gradient 22mHg.  The MVA by PHT appears inaccurate, gradient would support moderate mitral stenosis mitral valve stenosis.  5. The aorta is normal in size and structure.  6. The aortic root is normal in size and structure.  7. Pulmonary hypertension is indeterminate, inadequate TR jet.  8. The interatrial septum was not well visualized.  FINDINGS  Left Ventricle: The left ventricle has normal systolic function, with an ejection fraction of 55-60%. The cavity size was normal. There is moderately increased left ventricular wall thickness. Left ventricular diastolic Doppler parameters are  indeterminate. Definity contrast agent was given IV to delineate the left ventricular endocardial borders.  Right Ventricle: The right ventricle was not well visualized. The cavity was not assessed. There is right vetricular wall thickness was not assessed. RV poorly visualized, grossly appears normal in size and function.  Left Atrium: Left atrial size was severely dilated.  Right Atrium: Right atrial size was not well visualized. Right atrial pressure is estimated at 10 mmHg.  Interatrial Septum: The interatrial septum was not well visualized.  Pericardium: There is no evidence of pericardial effusion.  Mitral Valve: The mitral valve is abnormal. Moderate thickening of the mitral valve leaflet. Moderate calcification of the mitral valve leaflet. There is moderate mitral annular calcification present. Mitral valve regurgitation is not visualized by color  flow Doppler. moderate gradient across the mitral valve, mean gradient 845mg. The MVA by PHT appears inaccurate, gradient would support moderate mitral stenosis mitral valve stenosis.  Tricuspid Valve: The tricuspid valve is not well visualized. Tricuspid valve regurgitation was not visualized by color  flow Doppler.  Aortic Valve: The aortic valve is tricuspid Mild thickening of the aortic valve. Mild calcification of the aortic valve. Aortic valve regurgitation was not visualized by color flow Doppler. There is No stenosis of the aortic valve, with a calculated  valve area of 1.82 cm. Mild aortic annular calcification noted.  Pulmonic Valve: The pulmonic valve was not well visualized. Pulmonic valve regurgitation is not visualized by color  flow Doppler. No evidence of pulmonic stenosis.  Aorta: The aortic root is normal in size and structure. The aorta is normal in size and structure.  Pulmonary Artery: Pulmonary hypertension is indeterminate, inadequate TR jet.  Venous: The inferior vena cava is normal in size with greater than 50% respiratory variability.      Recent Labs: 09/28/2018: B Natriuretic Peptide 1,053.0; TSH 0.828 10/05/2018: Hemoglobin 10.8; Platelets 304 10/06/2018: Magnesium 2.0 10/22/2018: ALT 15; BUN 32; Creatinine, Ser 1.96; Potassium 4.8; Sodium 138  No results found for requested labs within last 8760 hours.   CrCl cannot be calculated (Patient's most recent lab result is older than the maximum 21 days allowed.).   Wt Readings from Last 3 Encounters:  07/09/19 188 lb (85.3 kg)  10/22/18 206 lb (93.4 kg)  09/28/18 200 lb (90.7 kg)     Other studies reviewed: Additional studies/records reviewed today include: summarized above  ASSESSMENT AND PLAN:  1. Paroxysmal Afib     CHA2DS2Vasc is 7, on Eliquis, appropriately dosed (her Creat appears generally >1.5)     ILR is EOS, she is not interested in having it removed     RRR today, no symptoms of palpitations or AFib     Fatigue sounds much the same as last year with minimal AF burden     On amio and dilt seem to be working well for her      BMET, CBC, LFTs and TSH today   2. HTN     On low end today, a recheck was 98/56     Reduce her losartan to 37m daily     Her granddaughter confirms she is  definitely NOT taking chlorthalidone (initially noted on her home list)  3. HFpEF     No symptoms or exam findings to suggest ongoing/recurrent fluid OL           4. Fatigue     Reduce her losartan as above, CBC today, sounds somewhat chronic     They will monitor her BP at home, if not better, a little higher,  will let u[place]ore[place]now   5. Murmur     TTE <1 year ago reviewed     Only MAC, mod calcification of leaflet, mod MS    Disposition: Will plan for 6 months, sooner if needed.  F/u on her labs when resulted.    Current medicines are reviewed at length with the patient today.  The patient did not have any concerns regarding medicines.  SVenetia Night PA-C 07/09/2019 5:19 PM     [place] [phone](office)  ([phone](fax)

## 2019-07-10 LAB — BASIC METABOLIC PANEL
BUN/Creatinine Ratio: 12 (ref 12–28)
BUN: 37 mg/dL — ABNORMAL HIGH (ref 8–27)
CO2: 23 mmol/L (ref 20–29)
Calcium: 9.7 mg/dL (ref 8.7–10.3)
Chloride: 96 mmol/L (ref 96–106)
Creatinine, Ser: 3.05 mg/dL — ABNORMAL HIGH (ref 0.57–1.00)
GFR calc Af Amer: 15 mL/min/{1.73_m2} — ABNORMAL LOW (ref >59)
GFR calc non Af Amer: 13 mL/min/{1.73_m2} — ABNORMAL LOW (ref >59)
Glucose: 402 mg/dL — ABNORMAL HIGH (ref 65–99)
Potassium: 5.4 mmol/L — ABNORMAL HIGH (ref 3.5–5.2)
Sodium: 134 mmol/L (ref 134–144)

## 2019-07-10 LAB — LIPID PANEL
Chol/HDL Ratio: 4.4 ratio (ref 0.0–4.4)
Cholesterol, Total: 153 mg/dL (ref 100–199)
HDL: 35 mg/dL — ABNORMAL LOW (ref >39)
LDL Chol Calc (NIH): 70 mg/dL (ref 0–99)
Triglycerides: 300 mg/dL — ABNORMAL HIGH (ref 0–149)
VLDL Cholesterol Cal: 48 mg/dL — ABNORMAL HIGH (ref 5–40)

## 2019-07-10 LAB — CBC
Hematocrit: 31 % — ABNORMAL LOW (ref 34.0–46.6)
Hemoglobin: 9.5 g/dL — ABNORMAL LOW (ref 11.1–15.9)
MCH: 26.2 pg — ABNORMAL LOW (ref 26.6–33.0)
MCHC: 30.6 g/dL — ABNORMAL LOW (ref 31.5–35.7)
MCV: 86 fL (ref 79–97)
Platelets: 356 10*3/uL (ref 150–450)
RBC: 3.62 x10E6/uL — ABNORMAL LOW (ref 3.77–5.28)
RDW: 14.8 % (ref 11.7–15.4)
WBC: 8.2 10*3/uL (ref 3.4–10.8)

## 2019-07-10 LAB — TSH: TSH: 5.28 u[IU]/mL — ABNORMAL HIGH (ref 0.450–4.500)

## 2019-07-12 ENCOUNTER — Telehealth: Payer: Self-pay

## 2019-07-12 DIAGNOSIS — Z79899 Other long term (current) drug therapy: Secondary | ICD-10-CM

## 2019-07-12 DIAGNOSIS — R899 Unspecified abnormal finding in specimens from other organs, systems and tissues: Secondary | ICD-10-CM

## 2019-07-12 DIAGNOSIS — I5031 Acute diastolic (congestive) heart failure: Secondary | ICD-10-CM

## 2019-07-12 NOTE — Telephone Encounter (Signed)
LM for the pts granddaughter to please call our office back.  Spoke with the pt with the recommendations but she has already had her meds today but will put the bottles aside and not take anymore until after we talk with her again.   I advised her she needs labwork again this Thursday but she says her granddaughter works and is off on Friday but I asked that she talk with her or I will when she calls back to hopefully bring her in Thursday due to the abnormality of her labs.

## 2019-07-12 NOTE — Telephone Encounter (Signed)
-----   Message from [facility], [place] sent at 07/12/2019 10:46 AM EDT ----- STOP losartan, lasix and potassium.  Increase oral hydration with water, repeat Thursday I will review with Dr. Caryl Comes, though please call her granddaughter now and provide instructions  THANK you

## 2019-07-14 NOTE — Addendum Note (Signed)
Addended by: Thora Lance on: 07/14/2019 03:02 PM   Modules accepted: Orders

## 2019-07-14 NOTE — Telephone Encounter (Addendum)
Per Tommye Standard repeat BMET on Monday 07/19/2019.  Phone call to pt's granddaughter and left detailed voicemail message per her request to have pt's BMET redrawn 07/19/2019 and contact RN with any further questions at [phone].  BMET ordered and released so pt may go to any [facility] as pt lives in [place].

## 2019-07-14 NOTE — Telephone Encounter (Signed)
Spoke with pt's granddaughter, [name] (DPR) and advised pt was advised on 07/12/2019 to stop taking her Losartan, Lasix and Potassium, increase her water intake and repeat labs on 07/15/2019.  Pt's granddaughter reports pt did not stop medications as recommended and has taken medications already today but she will stop these meds as of tomorrow 07/15/2019.  Will forward information to Tommye Standard, PA-C for review and recommendation.

## 2019-07-14 NOTE — Telephone Encounter (Signed)
Follow up ° ° °Patient's daughter is returning your call. Please call. °

## 2019-07-15 ENCOUNTER — Other Ambulatory Visit: Payer: Medicare HMO

## 2019-07-20 DIAGNOSIS — Z79899 Other long term (current) drug therapy: Secondary | ICD-10-CM | POA: Diagnosis not present

## 2019-07-20 DIAGNOSIS — R899 Unspecified abnormal finding in specimens from other organs, systems and tissues: Secondary | ICD-10-CM | POA: Diagnosis not present

## 2019-07-20 DIAGNOSIS — I5031 Acute diastolic (congestive) heart failure: Secondary | ICD-10-CM | POA: Diagnosis not present

## 2019-07-21 LAB — BASIC METABOLIC PANEL
BUN/Creatinine Ratio: 14 (ref 12–28)
BUN: 34 mg/dL — ABNORMAL HIGH (ref 8–27)
CO2: 21 mmol/L (ref 20–29)
Calcium: 10.4 mg/dL — ABNORMAL HIGH (ref 8.7–10.3)
Chloride: 97 mmol/L (ref 96–106)
Creatinine, Ser: 2.39 mg/dL — ABNORMAL HIGH (ref 0.57–1.00)
GFR calc Af Amer: 21 mL/min/{1.73_m2} — ABNORMAL LOW (ref >59)
GFR calc non Af Amer: 18 mL/min/{1.73_m2} — ABNORMAL LOW (ref >59)
Glucose: 204 mg/dL — ABNORMAL HIGH (ref 65–99)
Potassium: 3.9 mmol/L (ref 3.5–5.2)
Sodium: 136 mmol/L (ref 134–144)

## 2019-07-23 ENCOUNTER — Telehealth: Payer: Self-pay | Admitting: *Deleted

## 2019-07-23 DIAGNOSIS — Z79899 Other long term (current) drug therapy: Secondary | ICD-10-CM

## 2019-07-23 NOTE — Telephone Encounter (Signed)
Lvm on grand daughter phone to call back and discuss lab appointment and medications to stay off.

## 2019-07-24 ENCOUNTER — Inpatient Hospital Stay (HOSPITAL_COMMUNITY)
Admission: EM | Admit: 2019-07-24 | Discharge: 2019-08-03 | DRG: 291 | Disposition: A | Discharge status: Skilled Nursing Facility | Payer: Medicare HMO | Attending: Internal Medicine | Admitting: Internal Medicine

## 2019-07-24 ENCOUNTER — Other Ambulatory Visit: Payer: Self-pay

## 2019-07-24 ENCOUNTER — Emergency Department (HOSPITAL_COMMUNITY): Payer: Medicare HMO

## 2019-07-24 ENCOUNTER — Encounter (HOSPITAL_COMMUNITY): Payer: Self-pay | Admitting: Emergency Medicine

## 2019-07-24 DIAGNOSIS — Z66 Do not resuscitate: Secondary | ICD-10-CM | POA: Diagnosis not present

## 2019-07-24 DIAGNOSIS — I130 Hypertensive heart and chronic kidney disease with heart failure and stage 1 through stage 4 chronic kidney disease, or unspecified chronic kidney disease: Principal | ICD-10-CM | POA: Diagnosis present

## 2019-07-24 DIAGNOSIS — F329 Major depressive disorder, single episode, unspecified: Secondary | ICD-10-CM | POA: Diagnosis present

## 2019-07-24 DIAGNOSIS — I639 Cerebral infarction, unspecified: Secondary | ICD-10-CM | POA: Diagnosis present

## 2019-07-24 DIAGNOSIS — I5031 Acute diastolic (congestive) heart failure: Secondary | ICD-10-CM | POA: Diagnosis not present

## 2019-07-24 DIAGNOSIS — N184 Chronic kidney disease, stage 4 (severe): Secondary | ICD-10-CM | POA: Diagnosis not present

## 2019-07-24 DIAGNOSIS — J9601 Acute respiratory failure with hypoxia: Secondary | ICD-10-CM | POA: Diagnosis not present

## 2019-07-24 DIAGNOSIS — J811 Chronic pulmonary edema: Secondary | ICD-10-CM

## 2019-07-24 DIAGNOSIS — I5033 Acute on chronic diastolic (congestive) heart failure: Secondary | ICD-10-CM | POA: Diagnosis not present

## 2019-07-24 DIAGNOSIS — Z7189 Other specified counseling: Secondary | ICD-10-CM | POA: Diagnosis not present

## 2019-07-24 DIAGNOSIS — E1122 Type 2 diabetes mellitus with diabetic chronic kidney disease: Secondary | ICD-10-CM | POA: Diagnosis present

## 2019-07-24 DIAGNOSIS — Z20822 Contact with and (suspected) exposure to covid-19: Secondary | ICD-10-CM | POA: Diagnosis present

## 2019-07-24 DIAGNOSIS — E11649 Type 2 diabetes mellitus with hypoglycemia without coma: Secondary | ICD-10-CM | POA: Diagnosis not present

## 2019-07-24 DIAGNOSIS — Z7901 Long term (current) use of anticoagulants: Secondary | ICD-10-CM

## 2019-07-24 DIAGNOSIS — Z515 Encounter for palliative care: Secondary | ICD-10-CM

## 2019-07-24 DIAGNOSIS — F419 Anxiety disorder, unspecified: Secondary | ICD-10-CM | POA: Diagnosis present

## 2019-07-24 DIAGNOSIS — Z8673 Personal history of transient ischemic attack (TIA), and cerebral infarction without residual deficits: Secondary | ICD-10-CM | POA: Diagnosis not present

## 2019-07-24 DIAGNOSIS — E1159 Type 2 diabetes mellitus with other circulatory complications: Secondary | ICD-10-CM | POA: Diagnosis not present

## 2019-07-24 DIAGNOSIS — Q211 Atrial septal defect: Secondary | ICD-10-CM | POA: Diagnosis not present

## 2019-07-24 DIAGNOSIS — I480 Paroxysmal atrial fibrillation: Secondary | ICD-10-CM | POA: Diagnosis not present

## 2019-07-24 DIAGNOSIS — Z794 Long term (current) use of insulin: Secondary | ICD-10-CM

## 2019-07-24 DIAGNOSIS — D631 Anemia in chronic kidney disease: Secondary | ICD-10-CM | POA: Diagnosis present

## 2019-07-24 DIAGNOSIS — E785 Hyperlipidemia, unspecified: Secondary | ICD-10-CM | POA: Diagnosis not present

## 2019-07-24 DIAGNOSIS — I10 Essential (primary) hypertension: Secondary | ICD-10-CM | POA: Diagnosis not present

## 2019-07-24 DIAGNOSIS — I509 Heart failure, unspecified: Secondary | ICD-10-CM

## 2019-07-24 DIAGNOSIS — R0902 Hypoxemia: Secondary | ICD-10-CM | POA: Diagnosis not present

## 2019-07-24 DIAGNOSIS — Z79899 Other long term (current) drug therapy: Secondary | ICD-10-CM

## 2019-07-24 DIAGNOSIS — J9621 Acute and chronic respiratory failure with hypoxia: Secondary | ICD-10-CM | POA: Diagnosis present

## 2019-07-24 DIAGNOSIS — I5032 Chronic diastolic (congestive) heart failure: Secondary | ICD-10-CM | POA: Diagnosis not present

## 2019-07-24 DIAGNOSIS — D72829 Elevated white blood cell count, unspecified: Secondary | ICD-10-CM | POA: Diagnosis present

## 2019-07-24 DIAGNOSIS — E782 Mixed hyperlipidemia: Secondary | ICD-10-CM | POA: Diagnosis not present

## 2019-07-24 DIAGNOSIS — I1310 Hypertensive heart and chronic kidney disease without heart failure, with stage 1 through stage 4 chronic kidney disease, or unspecified chronic kidney disease: Secondary | ICD-10-CM | POA: Diagnosis not present

## 2019-07-24 DIAGNOSIS — R0602 Shortness of breath: Secondary | ICD-10-CM | POA: Diagnosis not present

## 2019-07-24 DIAGNOSIS — I110 Hypertensive heart disease with heart failure: Secondary | ICD-10-CM | POA: Diagnosis not present

## 2019-07-24 DIAGNOSIS — R918 Other nonspecific abnormal finding of lung field: Secondary | ICD-10-CM | POA: Diagnosis not present

## 2019-07-24 DIAGNOSIS — E1165 Type 2 diabetes mellitus with hyperglycemia: Secondary | ICD-10-CM | POA: Diagnosis present

## 2019-07-24 DIAGNOSIS — J90 Pleural effusion, not elsewhere classified: Secondary | ICD-10-CM | POA: Diagnosis not present

## 2019-07-24 DIAGNOSIS — R069 Unspecified abnormalities of breathing: Secondary | ICD-10-CM | POA: Diagnosis not present

## 2019-07-24 DIAGNOSIS — E119 Type 2 diabetes mellitus without complications: Secondary | ICD-10-CM

## 2019-07-24 DIAGNOSIS — J9811 Atelectasis: Secondary | ICD-10-CM | POA: Diagnosis not present

## 2019-07-24 LAB — CBC
HCT: 31.3 % — ABNORMAL LOW (ref 36.0–46.0)
Hemoglobin: 9 g/dL — ABNORMAL LOW (ref 12.0–15.0)
MCH: 25.8 pg — ABNORMAL LOW (ref 26.0–34.0)
MCHC: 28.8 g/dL — ABNORMAL LOW (ref 30.0–36.0)
MCV: 89.7 fL (ref 80.0–100.0)
Platelets: 422 10*3/uL — ABNORMAL HIGH (ref 150–400)
RBC: 3.49 MIL/uL — ABNORMAL LOW (ref 3.87–5.11)
RDW: 16 % — ABNORMAL HIGH (ref 11.5–15.5)
WBC: 13.3 10*3/uL — ABNORMAL HIGH (ref 4.0–10.5)
nRBC: 0.2 % (ref 0.0–0.2)

## 2019-07-24 LAB — BASIC METABOLIC PANEL
Anion gap: 10 (ref 5–15)
BUN: 41 mg/dL — ABNORMAL HIGH (ref 8–23)
CO2: 26 mmol/L (ref 22–32)
Calcium: 9.6 mg/dL (ref 8.9–10.3)
Chloride: 95 mmol/L — ABNORMAL LOW (ref 98–111)
Creatinine, Ser: 2.5 mg/dL — ABNORMAL HIGH (ref 0.44–1.00)
GFR calc Af Amer: 20 mL/min — ABNORMAL LOW (ref >60)
GFR calc non Af Amer: 17 mL/min — ABNORMAL LOW (ref >60)
Glucose, Bld: 205 mg/dL — ABNORMAL HIGH (ref 70–99)
Potassium: 4.1 mmol/L (ref 3.5–5.1)
Sodium: 131 mmol/L — ABNORMAL LOW (ref 135–145)

## 2019-07-24 LAB — TROPONIN I (HIGH SENSITIVITY)
Troponin I (High Sensitivity): 13 ng/L (ref <18)
Troponin I (High Sensitivity): 14 ng/L (ref <18)

## 2019-07-24 LAB — BRAIN NATRIURETIC PEPTIDE: B Natriuretic Peptide: 365 pg/mL — ABNORMAL HIGH (ref 0.0–100.0)

## 2019-07-24 LAB — SARS CORONAVIRUS 2 BY RT PCR (HOSPITAL ORDER, PERFORMED IN ~~LOC~~ HOSPITAL LAB): SARS Coronavirus 2: NEGATIVE

## 2019-07-24 MED ORDER — FUROSEMIDE 10 MG/ML IJ SOLN
40.0000 mg | Freq: Once | INTRAMUSCULAR | Status: AC
  Administered 2019-07-24: 40 mg via INTRAVENOUS
  Filled 2019-07-24: qty 4

## 2019-07-24 NOTE — ED Triage Notes (Signed)
Pt brught in by EMS with c/o shortness of breath x 3 days. Pt states MD took her off her Lasix 2 weeks ago. Per EMS, pt was 90% on room air when they arrived and was "tight with wheezes". Pt was given Albuterol neb in route and placed on O2 @ 3L nasal cannula.

## 2019-07-24 NOTE — ED Provider Notes (Signed)
[facility] EMERGENCY DEPARTMENT Provider Note   CSN: [id number] Arrival date & time: 07/24/19  1936     History Chief Complaint  Patient presents with  . Shortness of Breath    Amy Wiggins is a 86 y.o. female.  Patient is an 86 year old female with past medical history of chronic renal insufficiency, congestive heart failure, diabetes, paroxysmal A. fib, prior stroke.  She presents today for evaluation of shortness of breath.  She reports URI-like symptoms for several days, then over the past 2 days, she has experienced worsening breathing.  She describes congestion in her chest along with wheezing.  She denies productive cough or fever.  She denies any leg swelling.  She does tell me that her cardiologist took her off of her Lasix about 2 weeks ago for reasons she does not know.  The history is provided by the patient.  Shortness of Breath Severity:  Moderate Onset quality:  Gradual Duration:  2 days Timing:  Constant Progression:  Worsening Chronicity:  New Relieved by:  Nothing Worsened by:  Movement Ineffective treatments:  None tried Associated symptoms: no cough, no fever and no sputum production        Past Medical History:  Diagnosis Date  . Chronic anticoagulation   . Depression   . Diabetes mellitus   . Hyperlipidemia   . Hypertension   . PAF (paroxysmal atrial fibrillation) ([place])   . PFO (patent foramen ovale)   . Stroke ([place])    TIA's x 2    Patient Active Problem List   Diagnosis Date Noted  . Acute on chronic congestive heart failure ([place])   . Acute diastolic CHF (congestive heart failure) ([place]) 09/29/2018  . CKD (chronic kidney disease) stage 3, GFR 30-59 ml/min 09/29/2018  . Acute respiratory failure with hypoxia ([place]) 09/29/2018  . Atrial fibrillation with RVR ([place])   . Hypotension 09/28/2018  . Chronic a-fib with RVR 09/28/2018  . Cerebellar infarct ([place])   . Stroke ([place]) 02/22/2016  . Ischemic stroke ([place]) 02/22/2016  .  HFpEF/Chronic diastolic congestive heart failure ([place]) 02/22/2016  . AKI (acute kidney injury) ([place]) 02/22/2016  . Diabetes mellitus with complication ([place])   . Shortness of breath 01/14/2016  . Wheezing 01/14/2016  . Paroxysmal atrial fibrillation ([place]) 11/14/2015  . Cerebrovascular accident (CVA) due to embolism of right posterior cerebral artery ([place]) 08/08/2015  . Essential hypertension 08/08/2015  . HLD (hyperlipidemia) 08/08/2015  . Type 2 diabetes mellitus with circulatory disorder ([place]) 08/08/2015  . PFO (patent foramen ovale)   . ARF (acute renal failure) ([place]) 06/04/2015  . Cerebral infarction due to unspecified mechanism   . Acute CVA (cerebrovascular accident) ([place]) 06/02/2015  . Hypertension 06/02/2015  . Hyperlipidemia 06/02/2015  . CKD (chronic kidney disease) stage 4, GFR 15-29 ml/min (H[facility]) 06/02/2015  . DM type 2 (diabetes mellitus, type 2) ([place]) 06/02/2015  . Leukocytosis 06/02/2015  . Muscle weakness (generalized) 09/16/2012  . Pain in joint, shoulder region 09/16/2012  . Closed displaced fracture of lateral end of clavicle 08/18/2012    Past Surgical History:  Procedure Laterality Date  . COLONOSCOPY    . COLONOSCOPY N/A 09/09/2014   Procedure: COLONOSCOPY;  Surgeon: Rogene Houston, MD;  Location: [facility];  Service: Endoscopy;  Laterality: N/A;  1010 - moved to 7:30 - Ann to notify  . EP IMPLANTABLE DEVICE N/A 06/07/2015   Procedure: Loop Recorder Insertion;  Surgeon: Deboraha Sprang, MD;  Location: [facility] LAB;  Service: Cardiovascular;  Laterality:  N/A;  . TEE WITHOUT CARDIOVERSION N/A 06/07/2015   Procedure: TRANSESOPHAGEAL ECHOCARDIOGRAM (TEE);  Surgeon: Thayer Headings, MD;  Location: [facility] ENDOSCOPY;  Service: Cardiovascular;  Laterality: N/A;     OB History   No obstetric history on file.     Family History  Problem Relation Age of Onset  . Stroke Father   . Dementia Sister     Social History   Tobacco Use  . Smoking status: Never  Smoker  . Smokeless tobacco: Never Used  Substance Use Topics  . Alcohol use: No  . Drug use: No    Home Medications Prior to Admission medications   Medication Sig Start Date End Date Taking? Authorizing Provider  acetaminophen (TYLENOL) 500 MG tablet Take 1,000 mg by mouth at bedtime. Take 2 tablets (1000 mg) by mouth daily at bedtime, may also take 2 tablets during the day as needed for pain    [provider]  amiodarone (PACERONE) 200 MG tablet Take 1 tablet (200 mg total) by mouth daily. 10/22/18   Baldwin Jamaica, PA-C  atorvastatin (LIPITOR) 20 MG tablet Take 2 tablets (40 mg total) by mouth every evening. 4pm Patient taking differently: Take 20 mg by mouth at bedtime.  02/24/16   Caren Griffins, MD  buPROPion (WELLBUTRIN XL) 300 MG 24 hr tablet Take 300 mg by mouth daily.    [provider]  citalopram (CELEXA) 20 MG tablet Take 20 mg by mouth daily.  04/01/17   [provider]  diltiazem (CARDIZEM CD) 300 MG 24 hr capsule Take 1 capsule (300 mg total) by mouth daily. 10/07/18   Johnson, Clanford L, MD  ELIQUIS 2.5 MG TABS tablet Take 2.5 mg by mouth 2 (two) times daily.  04/01/17   [provider]  furosemide (LASIX) 40 MG tablet Take 1 tablet (40 mg total) by mouth daily. 10/07/18   Johnson, Clanford L, MD  Ginkgo Biloba 120 MG CAPS Take 120 mg by mouth daily.    [provider]  LANTUS SOLOSTAR 100 UNIT/ML Solostar Pen Inject 44 Units into the skin daily.  02/27/17   [provider]  losartan (COZAAR) 25 MG tablet Take 1 tablet (25 mg total) by mouth daily. 07/09/19   Baldwin Jamaica, PA-C  Magnesium Oxide 250 MG TABS Take 500 mg by mouth every evening. 500mg  tablets on hand    [provider]  mirtazapine (REMERON) 15 MG tablet Take 15 mg by mouth daily. 06/04/19   [provider]  Multiple Vitamin (MULTIVITAMIN WITH MINERALS) TABS tablet Take 1 tablet by mouth at bedtime. For supplement    [provider]  Multiple Vitamins-Minerals (PRESERVISION AREDS 2) CAPS Take 1 capsule by mouth 2 (two) times daily.    [provider]  pantoprazole (PROTONIX) 40 MG tablet Take 40 mg by mouth daily. For acid reflux/gerd    [provider]  potassium chloride SA (K-DUR) 20 MEQ tablet Take 2 tablets (40 mEq total) by mouth daily. 10/07/18   Johnson, Clanford L, MD  traZODone (DESYREL) 100 MG tablet Take 150 mg by mouth at bedtime.     [provider]    Allergies    Patient has no known allergies.  Review of Systems   Review of Systems  Constitutional: Negative for fever.  Respiratory: Positive for shortness of breath. Negative for cough and sputum production.   All other systems reviewed and are negative.   Physical Exam Updated Vital Signs BP (!) 151/64 (BP  Location: Left Arm)   Pulse 96   Temp 98.7 F (37.1 C) (Oral)   Resp 20   SpO2 94%   Physical Exam Vitals and nursing note reviewed.  Constitutional:      General: She is not in acute distress.    Appearance: She is well-developed. She is not diaphoretic.  HENT:     Head: Normocephalic and atraumatic.  Cardiovascular:     Rate and Rhythm: Normal rate and regular rhythm.     Heart sounds: No murmur. No friction rub. No gallop.   Pulmonary:     Effort: Pulmonary effort is normal. No respiratory distress.     Breath sounds: Examination of the right-lower field reveals rales. Examination of the left-lower field reveals rales. Rales present. No wheezing.  Abdominal:     General: Bowel sounds are normal. There is no distension.     Palpations: Abdomen is soft.     Tenderness: There is no abdominal tenderness.  Musculoskeletal:        General: Normal range of motion.     Cervical back: Normal range of motion and neck supple.     Right lower leg: No tenderness. No edema.     Left lower leg: No tenderness. No edema.  Skin:    General: Skin is warm and dry.  Neurological:     Mental Status: She is alert and  oriented to person, place, and time.     ED Results / Procedures / Treatments   Labs (all labs ordered are listed, but only abnormal results are displayed) Labs Reviewed  BASIC METABOLIC PANEL  CBC  BRAIN NATRIURETIC PEPTIDE  TROPONIN I (HIGH SENSITIVITY)    EKG ED ECG REPORT   Date: 07/24/2019  Rate: 95  Rhythm: normal sinus rhythm  QRS Axis: right  Intervals: PR prolonged  ST/T Wave abnormalities: normal  Conduction Disutrbances:none  Narrative Interpretation:   Old EKG Reviewed: unchanged  I have personally reviewed the EKG tracing and agree with the computerized printout as noted.   Radiology DG Chest 2 View  Result Date: 07/24/2019 CLINICAL DATA:  86 year old female with shortness of breath. EXAM: CHEST - 2 VIEW COMPARISON:  Chest radiograph dated 10/02/2018. FINDINGS: There is diffuse vascular and interstitial prominence which may represent edema. Confluent areas of air pace density involving the right lung and left lung base may represent edema or pneumonia. Clinical correlation is recommended. Probable small left pleural effusion. No pneumothorax. Stable mild cardiomegaly. A loop recorder device is noted. Atherosclerotic calcification of the aorta. No acute osseous pathology. Degenerative changes. IMPRESSION: Bilateral pulmonary opacities may represent edema or pneumonia. Clinical correlation is recommended. Electronically Signed   By: Anner Crete M.D.   On: 07/24/2019 20:20    Procedures Procedures (including critical care time)  Medications Ordered in ED Medications - No data to display  ED Course  I have reviewed the triage vital signs and the nursing notes.  Pertinent labs & imaging results that were available during my care of the patient were reviewed by me and considered in my medical decision making (see chart for details).    MDM Rules/Calculators/A&P  Patient presenting with complaints of shortness of breath that has worsened over the past  several days.  She was recently taken off of her Lasix for reasons I am uncertain.  She was found by EMS to be hypoxic with O2 saturations of 85%.  Her work-up shows a BNP of 365 and chest x-ray shows either developing infiltrate or CHF.  Given the history and absence of fever or cough, I suspect fluid retention.  Patient given IV Lasix.  As she has an oxygen requirement, I do not feel as though she can be safely discharged.  I have spoken with Dr. Darrick Meigs who agrees to admit.  Final Clinical Impression(s) / ED Diagnoses Final diagnoses:  None    Rx / DC Orders ED Discharge Orders    None       Veryl Speak, MD 07/24/19 2226

## 2019-07-24 NOTE — ED Notes (Signed)
Pt given sandwich to eat and a drink.

## 2019-07-24 NOTE — ED Notes (Signed)
Dr Stark Jock in room to update pt's granddaughter on pt's condition.

## 2019-07-24 NOTE — H&P (Addendum)
TRH H&P    Patient Demographics:    Amy Wiggins, is a 86 y.o. female  MRN: [id number]  DOB - 09-13-1931  Admit Date - 07/24/2019  Referring MD/NP/PA: Dr. Stark Jock  Outpatient Primary MD for the patient is Asencion Noble, MD  Patient coming from: Home  Chief complaint-shortness of breath   HPI:    Amy Wiggins  is a 86 y.o. female, with history of paroxysmal atrial fibrillation on chronic anticoagulation with Eliquis, HFpEF, stroke, hypertension, hyperlipidemia, diabetes mellitus type 2, depression, CKD stage IV came to ED with complaints of shortness of breath.  Patient says that her Lasix was stopped about 2 weeks ago by cardiologist PA.  Patient says that since that time she has experienced worsening shortness of breath.  She denies fever or chills.  Denies coughing up any phlegm.  Denies chest pain.  Denies nausea vomiting or diarrhea.  Denies abdominal pain or dysuria. In the ED chest x-ray showed pulmonary edema, BNP 365, her echocardiogram done in August 2020 showed EF 55 to 60%, indeterminate left ventricular diastolic dysfunction. Patient was given 1 dose of Lasix 40 mg IV. She is requiring 3 L of oxygen via nasal cannula.    Review of systems:    In addition to the HPI above,    All other systems reviewed and are negative.    Past History of the following :    Past Medical History:  Diagnosis Date  . Chronic anticoagulation   . Depression   . Diabetes mellitus   . Hyperlipidemia   . Hypertension   . PAF (paroxysmal atrial fibrillation) ([place])   . PFO (patent foramen ovale)   . Stroke ([place])    TIA's x 2      Past Surgical History:  Procedure Laterality Date  . COLONOSCOPY    . COLONOSCOPY N/A 09/09/2014   Procedure: COLONOSCOPY;  Surgeon: Rogene Houston, MD;  Location[facility];  Service: Endoscopy;  Laterality: N/A;  1010 - moved to 7:30 - Ann to notify  . EP IMPLANTABLE  DEVICE N/A 06/07/2015   Procedure: Loop Recorder Insertion;  Surgeon: Deboraha Sprang, MD;  Location: [facility];  Service: Cardiovascular;  Laterality: N/A;  . TEE WITHOUT CARDIOVERSION N/A 06/07/2015   Procedure: TRANSESOPHAGEAL ECHOCARDIOGRAM (TEE);  Surgeon: Thayer Headings, MD;  Location: [facility] ENDOSCOPY;  Service: Cardiovascular;  Laterality: N/A;      Social History:      Social History   Tobacco Use  . Smoking status: Never Smoker  . Smokeless tobacco: Never Used  Substance Use Topics  . Alcohol use: No       Family History :     Family History  Problem Relation Age of Onset  . Stroke Father   . Dementia Sister       Home Medications:   Prior to Admission medications   Medication Sig Start Date End Date Taking? Authorizing Provider  acetaminophen (TYLENOL) 500 MG tablet Take 1,000 mg by mouth at bedtime. Take 2 tablets (1000 mg) by mouth daily at  bedtime, may also take 2 tablets during the day as needed for pain    [provider]  amiodarone (PACERONE) 200 MG tablet Take 1 tablet (200 mg total) by mouth daily. 10/22/18   Baldwin Jamaica, PA-C  atorvastatin (LIPITOR) 20 MG tablet Take 2 tablets (40 mg total) by mouth every evening. 4pm Patient taking differently: Take 20 mg by mouth at bedtime.  02/24/16   Caren Griffins, MD  buPROPion (WELLBUTRIN XL) 300 MG 24 hr tablet Take 300 mg by mouth daily.    [provider]  citalopram (CELEXA) 20 MG tablet Take 20 mg by mouth daily.  04/01/17   [provider]  diltiazem (CARDIZEM CD) 300 MG 24 hr capsule Take 1 capsule (300 mg total) by mouth daily. 10/07/18   Johnson, Clanford L, MD  ELIQUIS 2.5 MG TABS tablet Take 2.5 mg by mouth 2 (two) times daily.  04/01/17   [provider]  furosemide (LASIX) 40 MG tablet Take 1 tablet (40 mg total) by mouth daily. 10/07/18   Johnson, Clanford L, MD  Ginkgo Biloba 120 MG CAPS Take 120 mg by mouth daily.    [provider]  LANTUS  SOLOSTAR 100 UNIT/ML Solostar Pen Inject 44 Units into the skin daily.  02/27/17   [provider]  losartan (COZAAR) 25 MG tablet Take 1 tablet (25 mg total) by mouth daily. 07/09/19   Baldwin Jamaica, PA-C  Magnesium Oxide 250 MG TABS Take 500 mg by mouth every evening. 500mg  tablets on hand    [provider]  mirtazapine (REMERON) 15 MG tablet Take 15 mg by mouth daily. 06/04/19   [provider]  Multiple Vitamin (MULTIVITAMIN WITH MINERALS) TABS tablet Take 1 tablet by mouth at bedtime. For supplement    [provider]  Multiple Vitamins-Minerals (PRESERVISION AREDS 2) CAPS Take 1 capsule by mouth 2 (two) times daily.    [provider]  pantoprazole (PROTONIX) 40 MG tablet Take 40 mg by mouth daily. For acid reflux/gerd    [provider]  potassium chloride SA (K-DUR) 20 MEQ tablet Take 2 tablets (40 mEq total) by mouth daily. 10/07/18   Johnson, Clanford L, MD  traZODone (DESYREL) 100 MG tablet Take 150 mg by mouth at bedtime.     [provider]     Allergies:    No Known Allergies   Physical Exam:   Vitals  Blood pressure (!) 134/59, pulse 91, temperature 98.7 F (37.1 C), temperature source Oral, resp. rate 18, SpO2 93 %.  1.  General: Appears in no acute distress  2. Psychiatric: Alert, oriented x3, intact insight and judgment  3. Neurologic: Cranial nerves II through XII grossly intact,  4. HEENMT:  Atraumatic normocephalic, extraocular muscles are intact  5. Respiratory : Bilateral crackles auscultated  6. Cardiovascular : S1-S2, regular, no murmur auscultated  7. Gastrointestinal:  Abdomen is soft, nontender, no organomegaly     Data Review:    CBC Recent Labs  Lab 07/24/19 2047  WBC 13.3*  HGB 9.0*  HCT 31.3*  PLT 422*  MCV 89.7  MCH 25.8*  MCHC 28.8*  RDW 16.0*    ------------------------------------------------------------------------------------------------------------------  Results for orders placed or performed during the hospital encounter of 07/24/19 (from the past 48 hour(s))  SARS Coronavirus 2 by RT PCR (hospital order, performed in [facility] hospital lab) Nasopharyngeal Nasopharyngeal Swab     Status: None   Collection Time: 07/24/19  8:31 PM   Specimen: Nasopharyngeal  Swab  Result Value Ref Range   SARS Coronavirus 2 NEGATIVE NEGATIVE    Comment: (NOTE) SARS-CoV-2 target nucleic acids are NOT DETECTED. The SARS-CoV-2 RNA is generally detectable in upper and lower respiratory specimens during the acute phase of infection. The lowest concentration of SARS-CoV-2 viral copies this assay can detect is 250 copies / mL. A negative result does not preclude SARS-CoV-2 infection and should not be used as the sole basis for treatment or other patient management decisions.  A negative result may occur with improper specimen collection / handling, submission of specimen other than nasopharyngeal swab, presence of viral mutation(s) within the areas targeted by this assay, and inadequate number of viral copies (<250 copies / mL). A negative result must be combined with clinical observations, patient history, and epidemiological information. Fact Sheet for Patients:   StrictlyIdeas.no Fact Sheet for Healthcare Providers: BankingDealers.co.za This test is not yet approved or cleared  by the [place] FDA and has been authorized for detection and/or diagnosis of SARS-CoV-2 by FDA under an Emergency Use Authorization (EUA).  This EUA will remain in effect (meaning this test can be used) for the duration of the COVID-19 declaration under Section 564(b)(1) of the Act, 21 U.S.C. section 360bbb-3(b)(1), unless the authorization is terminated or revoked sooner. Performed at [facility], [address], [place], [place] [address]   Basic metabolic panel     Status: Abnormal   Collection Time: 07/24/19  8:47 PM  Result Value Ref Range   Sodium 131 (L) 135 - 145 mmol/L   Potassium 4.1 3.5 - 5.1 mmol/L   Chloride 95 (L) 98 - 111 mmol/L   CO2 26 22 - 32 mmol/L   Glucose, Bld 205 (H) 70 - 99 mg/dL    Comment: Glucose reference range applies only to samples taken after fasting for at least 8 hours.   BUN 41 (H) 8 - 23 mg/dL   Creatinine, Ser 2.50 (H) 0.44 - 1.00 mg/dL   Calcium 9.6 8.9 - 10.3 mg/dL   GFR calc non Af Amer 17 (L) >60 mL/min   GFR calc Af Amer 20 (L) >60 mL/min   Anion gap 10 5 - 15    Comment: Performed at [facility], [address], [place], [place] [address]  CBC     Status: Abnormal   Collection Time: 07/24/19  8:47 PM  Result Value Ref Range   WBC 13.3 (H) 4.0 - 10.5 K/uL   RBC 3.49 (L) 3.87 - 5.11 MIL/uL   Hemoglobin 9.0 (L) 12.0 - 15.0 g/dL   HCT 31.3 (L) 36.0 - 46.0 %   MCV 89.7 80.0 - 100.0 fL   MCH 25.8 (L) 26.0 - 34.0 pg   MCHC 28.8 (L) 30.0 - 36.0 g/dL   RDW 16.0 (H) 11.5 - 15.5 %   Platelets 422 (H) 150 - 400 K/uL   nRBC 0.2 0.0 - 0.2 %    Comment: Performed at [facility], [address], [place], [place] [address]  Brain natriuretic peptide     Status: Abnormal   Collection Time: 07/24/19  8:48 PM  Result Value Ref Range   B Natriuretic Peptide 365.0 (H) 0.0 - 100.0 pg/mL    Comment: Performed at [facility], [address] [place], [place] [address]  Troponin I (High Sensitivity)     Status: None   Collection Time: 07/24/19  8:48 PM  Result Value Ref Range   Troponin I (High Sensitivity) 13 <18 ng/L    Comment: (  NOTE) Elevated high sensitivity troponin I (hsTnI) values and significant  changes across serial measurements may suggest ACS but many other  chronic and acute conditions are known to elevate hsTnI results.  Refer to the "Links" section for chest pain algorithms and additional  guidance. Performed at [facility], [address], [place], [place] [address]   Troponin I (High Sensitivity)     Status: None   Collection Time: 07/24/19  9:56 PM  Result Value Ref Range   Troponin I (High Sensitivity) 14 <18 ng/L    Comment: (NOTE) Elevated high sensitivity troponin I (hsTnI) values and significant  changes across serial measurements may suggest ACS but many other  chronic and acute conditions are known to elevate hsTnI results.  Refer to the "Links" section for chest pain algorithms and additional  guidance. Performed at [facility], [address], [place],  [address]     Chemistries  Recent Labs  Lab 07/20/19 0825 07/24/19 2047  NA 136 131*  K 3.9 4.1  CL 97 95*  CO2 21 26  GLUCOSE 204* 205*  BUN 34* 41*  CREATININE 2.39* 2.50*  CALCIUM 10.4* 9.6   ------------------------------------------------------------------------------------------------------------------  ---------------------------------------------------------------------------------------------------------------- Urine analysis:    Component Value Date/Time   COLORURINE YELLOW 09/28/2018 1216   APPEARANCEUR HAZY (A) 09/28/2018 1216   LABSPEC 1.007 09/28/2018 1216   PHURINE 5.0 09/28/2018 1216   GLUCOSEU >=500 (A) 09/28/2018 1216   HGBUR NEGATIVE 09/28/2018 1216   BILIRUBINUR NEGATIVE 09/28/2018 1216   KETONESUR NEGATIVE 09/28/2018 1216   PROTEINUR NEGATIVE 09/28/2018 1216   UROBILINOGEN 0.2 12/12/2010 1102   NITRITE NEGATIVE 09/28/2018 1216   LEUKOCYTESUR MODERATE (A) 09/28/2018 1216      Imaging Results:    DG Chest 2 View  Result Date: 07/24/2019 CLINICAL DATA:  86 year old female with shortness of breath. EXAM: CHEST - 2 VIEW COMPARISON:  Chest radiograph dated 10/02/2018. FINDINGS: There is diffuse vascular and interstitial prominence which may represent edema. Confluent areas of air pace density involving the right lung and left lung base may represent edema or pneumonia. Clinical correlation is recommended. Probable  small left pleural effusion. No pneumothorax. Stable mild cardiomegaly. A loop recorder device is noted. Atherosclerotic calcification of the aorta. No acute osseous pathology. Degenerative changes. IMPRESSION: Bilateral pulmonary opacities may represent edema or pneumonia. Clinical correlation is recommended. Electronically Signed   By: Anner Crete M.D.   On: 07/24/2019 20:20    My personal review of EKG: Rhythm NSR, no ST changes   Assessment & Plan:    Active Problems:   Acute on chronic diastolic (congestive) heart failure ([place])   1. Acute on chronic diastolic CHF-patient presenting with worsening diastolic CHF, secondary to stopping p.o. Lasix 2 weeks ago.  She received Lasix 40 mg IV in the ED.  Continue with Lasix 40 mg IV every 12 hours.  Follow BMP in a.m.  Daily intake and output.  Daily weights. 2. Paroxysmal atrial fibrillation- CHA2DS2VASc score is 7 continue Cardizem, Eliquis for anticoagulation 3. Hypertension-we will hold losartan due to CKD stage IV 4. Diabetes mellitus type 2-continue Lantus 44 units subcu daily, will initiate sliding scale insulin with NovoLog.  Check CBG before every meal and at bedtime. 5. CKD stage IV-creatinine 2.50.  At baseline.  Patient started on IV Lasix as above.  Follow BMP in a.m.   DVT Prophylaxis-   Eliquis  AM Labs Ordered, also please review Full Orders  Family Communication: Admission, patients condition and plan of care including tests  being ordered have been discussed with the patient who indicate understanding and agree with the plan and Code Status.  Code Status: Full code  Admission status: Observation/Inpatient :The appropriate admission status for this patient is INPATIENT. Inpatient status is judged to be reasonable and necessary in order to provide the required intensity of service to ensure the patient's safety. The patient's presenting symptoms, physical exam findings, and initial radiographic and laboratory data in the  context of their chronic comorbidities is felt to place them at high risk for further clinical deterioration. Furthermore, it is not anticipated that the patient will be medically stable for discharge from the hospital within 2 midnights of admission. The following factors support the admission status of inpatient.     The patient's presenting symptoms include shortness of breath The worrisome physical exam findings include hypoxemia The initial radiographic and laboratory data are worrisome because of CHF exacerbation The chronic co-morbidities include diabetes mellitus type 2.       * I certify that at the point of admission it is my clinical judgment that the patient will require inpatient hospital care spanning beyond 2 midnights from the point of admission due to high intensity of service, high risk for further deterioration and high frequency of surveillance required.*  Time spent in minutes : 60 minutes   Ibeth Fahmy S Elice Crigger M.D

## 2019-07-25 DIAGNOSIS — I480 Paroxysmal atrial fibrillation: Secondary | ICD-10-CM

## 2019-07-25 DIAGNOSIS — E785 Hyperlipidemia, unspecified: Secondary | ICD-10-CM

## 2019-07-25 DIAGNOSIS — R0602 Shortness of breath: Secondary | ICD-10-CM

## 2019-07-25 DIAGNOSIS — E1159 Type 2 diabetes mellitus with other circulatory complications: Secondary | ICD-10-CM

## 2019-07-25 DIAGNOSIS — I10 Essential (primary) hypertension: Secondary | ICD-10-CM

## 2019-07-25 DIAGNOSIS — N184 Chronic kidney disease, stage 4 (severe): Secondary | ICD-10-CM

## 2019-07-25 DIAGNOSIS — I5032 Chronic diastolic (congestive) heart failure: Secondary | ICD-10-CM

## 2019-07-25 LAB — BASIC METABOLIC PANEL
Anion gap: 9 (ref 5–15)
BUN: 39 mg/dL — ABNORMAL HIGH (ref 8–23)
CO2: 25 mmol/L (ref 22–32)
Calcium: 9.1 mg/dL (ref 8.9–10.3)
Chloride: 97 mmol/L — ABNORMAL LOW (ref 98–111)
Creatinine, Ser: 2.43 mg/dL — ABNORMAL HIGH (ref 0.44–1.00)
GFR calc Af Amer: 20 mL/min — ABNORMAL LOW (ref >60)
GFR calc non Af Amer: 18 mL/min — ABNORMAL LOW (ref >60)
Glucose, Bld: 295 mg/dL — ABNORMAL HIGH (ref 70–99)
Potassium: 3.4 mmol/L — ABNORMAL LOW (ref 3.5–5.1)
Sodium: 131 mmol/L — ABNORMAL LOW (ref 135–145)

## 2019-07-25 LAB — HEMOGLOBIN A1C
Hgb A1c MFr Bld: 8.4 % — ABNORMAL HIGH (ref 4.8–5.6)
Mean Plasma Glucose: 194.38 mg/dL

## 2019-07-25 LAB — GLUCOSE, CAPILLARY
Glucose-Capillary: 192 mg/dL — ABNORMAL HIGH (ref 70–99)
Glucose-Capillary: 207 mg/dL — ABNORMAL HIGH (ref 70–99)
Glucose-Capillary: 211 mg/dL — ABNORMAL HIGH (ref 70–99)
Glucose-Capillary: 243 mg/dL — ABNORMAL HIGH (ref 70–99)
Glucose-Capillary: 266 mg/dL — ABNORMAL HIGH (ref 70–99)

## 2019-07-25 MED ORDER — APIXABAN 2.5 MG PO TABS
2.5000 mg | ORAL_TABLET | Freq: Two times a day (BID) | ORAL | Status: DC
  Administered 2019-07-25 – 2019-08-03 (×19): 2.5 mg via ORAL
  Filled 2019-07-25 (×20): qty 1

## 2019-07-25 MED ORDER — CITALOPRAM HYDROBROMIDE 20 MG PO TABS
20.0000 mg | ORAL_TABLET | Freq: Every day | ORAL | Status: DC
  Administered 2019-07-25 – 2019-08-03 (×10): 20 mg via ORAL
  Filled 2019-07-25 (×11): qty 1

## 2019-07-25 MED ORDER — ACETAMINOPHEN 325 MG PO TABS
650.0000 mg | ORAL_TABLET | ORAL | Status: DC | PRN
  Administered 2019-08-01: 650 mg via ORAL
  Filled 2019-07-25: qty 2

## 2019-07-25 MED ORDER — INSULIN GLARGINE 100 UNIT/ML ~~LOC~~ SOLN
44.0000 [IU] | Freq: Every day | SUBCUTANEOUS | Status: DC
  Administered 2019-07-25: 44 [IU] via SUBCUTANEOUS
  Filled 2019-07-25 (×3): qty 0.44

## 2019-07-25 MED ORDER — SODIUM CHLORIDE 0.9% FLUSH
3.0000 mL | Freq: Two times a day (BID) | INTRAVENOUS | Status: DC
  Administered 2019-07-25 – 2019-08-03 (×18): 3 mL via INTRAVENOUS

## 2019-07-25 MED ORDER — FUROSEMIDE 10 MG/ML IJ SOLN
40.0000 mg | Freq: Two times a day (BID) | INTRAMUSCULAR | Status: DC
  Administered 2019-07-25 – 2019-07-27 (×5): 40 mg via INTRAVENOUS
  Filled 2019-07-25 (×5): qty 4

## 2019-07-25 MED ORDER — DILTIAZEM HCL ER COATED BEADS 180 MG PO CP24
300.0000 mg | ORAL_CAPSULE | Freq: Every day | ORAL | Status: DC
  Administered 2019-07-25 – 2019-07-28 (×3): 300 mg via ORAL
  Filled 2019-07-25 (×5): qty 1

## 2019-07-25 MED ORDER — ONDANSETRON HCL 4 MG/2ML IJ SOLN: 4.0000 mg | Freq: Four times a day (QID) | INTRAMUSCULAR | Status: DC | PRN

## 2019-07-25 MED ORDER — INSULIN ASPART 100 UNIT/ML ~~LOC~~ SOLN
0.0000 [IU] | Freq: Three times a day (TID) | SUBCUTANEOUS | Status: DC
  Administered 2019-07-26: 11 [IU] via SUBCUTANEOUS
  Administered 2019-07-27: 8 [IU] via SUBCUTANEOUS
  Administered 2019-07-27: 3 [IU] via SUBCUTANEOUS
  Administered 2019-07-27 – 2019-07-28 (×2): 2 [IU] via SUBCUTANEOUS
  Administered 2019-07-28: 8 [IU] via SUBCUTANEOUS
  Administered 2019-07-29: 5 [IU] via SUBCUTANEOUS
  Administered 2019-07-29 (×2): 2 [IU] via SUBCUTANEOUS
  Administered 2019-07-30: 5 [IU] via SUBCUTANEOUS
  Administered 2019-07-30: 8 [IU] via SUBCUTANEOUS
  Administered 2019-07-30: 3 [IU] via SUBCUTANEOUS

## 2019-07-25 MED ORDER — PANTOPRAZOLE SODIUM 40 MG PO TBEC
40.0000 mg | DELAYED_RELEASE_TABLET | Freq: Every day | ORAL | Status: DC
  Administered 2019-07-25 – 2019-08-03 (×10): 40 mg via ORAL
  Filled 2019-07-25 (×10): qty 1

## 2019-07-25 MED ORDER — SODIUM CHLORIDE 0.9% FLUSH: 3.0000 mL | INTRAVENOUS | Status: DC | PRN

## 2019-07-25 MED ORDER — TRAZODONE HCL 50 MG PO TABS
150.0000 mg | ORAL_TABLET | Freq: Every day | ORAL | Status: DC
  Administered 2019-07-25 – 2019-08-02 (×9): 150 mg via ORAL
  Filled 2019-07-25 (×10): qty 3

## 2019-07-25 MED ORDER — INSULIN ASPART 100 UNIT/ML ~~LOC~~ SOLN
6.0000 [IU] | Freq: Three times a day (TID) | SUBCUTANEOUS | Status: DC
  Administered 2019-07-26 – 2019-07-30 (×12): 6 [IU] via SUBCUTANEOUS

## 2019-07-25 MED ORDER — INSULIN ASPART 100 UNIT/ML ~~LOC~~ SOLN
0.0000 [IU] | Freq: Every day | SUBCUTANEOUS | Status: DC
  Administered 2019-07-26 – 2019-07-29 (×2): 2 [IU] via SUBCUTANEOUS

## 2019-07-25 MED ORDER — AMIODARONE HCL 200 MG PO TABS
200.0000 mg | ORAL_TABLET | Freq: Every day | ORAL | Status: DC
  Administered 2019-07-25 – 2019-08-03 (×10): 200 mg via ORAL
  Filled 2019-07-25 (×10): qty 1

## 2019-07-25 MED ORDER — SODIUM CHLORIDE 0.9 % IV SOLN: 250.0000 mL | INTRAVENOUS | Status: DC | PRN

## 2019-07-25 MED ORDER — ATORVASTATIN CALCIUM 20 MG PO TABS
20.0000 mg | ORAL_TABLET | Freq: Every day | ORAL | Status: DC
  Administered 2019-07-25 – 2019-08-02 (×10): 20 mg via ORAL
  Filled 2019-07-25 (×10): qty 1

## 2019-07-25 MED ORDER — BUPROPION HCL ER (XL) 300 MG PO TB24
300.0000 mg | ORAL_TABLET | Freq: Every day | ORAL | Status: DC
  Administered 2019-07-25 – 2019-08-03 (×10): 300 mg via ORAL
  Filled 2019-07-25: qty 1
  Filled 2019-07-25 (×3): qty 2
  Filled 2019-07-25 (×6): qty 1

## 2019-07-25 MED ORDER — INSULIN ASPART 100 UNIT/ML ~~LOC~~ SOLN
0.0000 [IU] | Freq: Three times a day (TID) | SUBCUTANEOUS | Status: DC
  Administered 2019-07-25 (×2): 3 [IU] via SUBCUTANEOUS
  Administered 2019-07-25: 5 [IU] via SUBCUTANEOUS

## 2019-07-25 MED ORDER — MIRTAZAPINE 15 MG PO TABS
15.0000 mg | ORAL_TABLET | Freq: Every day | ORAL | Status: DC
  Administered 2019-07-25 – 2019-07-29 (×5): 15 mg via ORAL
  Filled 2019-07-25 (×5): qty 1

## 2019-07-25 NOTE — TOC Initial Note (Signed)
Transition of Care [facility]) - Initial/Assessment Note   Patient Details  Name: Amy Wiggins MRN: [id number] Date of Birth: Jul 21, 1931  Transition [facility]) CM/SW Contact:    Sherie Don, LCSW Phone Number: 07/25/2019, 12:21 PM  Clinical Narrative: Patient is an 86 year old female who was admitted for acute on chronic diastolic congestive heart failure. TOC received consult for CHF screening. CSW spoke with patient's granddaughter, Amy Wiggins, to complete initial assessment. Per granddaughter, she lives with the patient in a single family home. Patient has a rolling walker that granddaughter reports is sufficient for patient's needs to ambulate around the home. Granddaughter also reported patient has a built-in shower bench in her bathroom. Patient has a history of HH with [facility] and [facility].  CSW asked CHF screening questions. Granddaughter stated, "I try to" keep the patient on a heart healthy diet, but as the granddaughter works 11-hour days, she was unsure of the patient's diet while she is at work. Granddaughter reported meals are prepared with little to no salt. Per granddaughter, the patient does restrict fluids to some extent as she reported the patient often feels dehydrated. Granddaughter reported patient does weigh herself daily to monitor fluid retention.  Expected Discharge Plan: Home/Self Care Barriers to Discharge: Continued Medical Work up  Patient Goals and CMS Choice Patient states their goals for this hospitalization and ongoing recovery are:: Discharge home CMS Medicare.gov Compare Post Acute Care list provided to:: Patient Choice offered to / list presented to : Amy Wiggins (granddaughter))  Expected Discharge Plan and Services Expected Discharge Plan: Home/Self Care Living arrangements for the past 2 months: Single Family Home  Prior Living Arrangements/Services Living arrangements for the past 2 months: Single Family Home Lives with:: Relatives(Amy  Mee Wiggins (granddaughter)) Patient language and need for interpreter reviewed:: Yes Do you feel safe going back to the place where you live?: Yes      Need for Family Participation in Patient Care: Yes (Comment) Care giver support system in place?: Yes (comment)(Amy Wiggins (granddaugher) PH: [phone]) Current home services: DME(Rolling walker) Criminal Activity/Legal Involvement Pertinent to Current Situation/Hospitalization: No - Comment as needed  Activities of Daily Living Home Assistive Devices/Equipment: Wheelchair, CBG Meter ADL Screening (condition at time of admission) Patient's cognitive ability adequate to safely complete daily activities?: Yes Is the patient deaf or have difficulty hearing?: No Does the patient have difficulty seeing, even when wearing glasses/contacts?: No Does the patient have difficulty concentrating, remembering, or making decisions?: No Patient able to express need for assistance with ADLs?: Yes Does the patient have difficulty dressing or bathing?: No Independently performs ADLs?: Yes (appropriate for developmental age) Does the patient have difficulty walking or climbing stairs?: Yes Weakness of Legs: Both Weakness of Arms/Hands: None  Permission Sought/Granted Permission sought to share information with : Family Supports(Amy Wiggins (granddaughter) PH: [phone]) Share Information with NAME: Amy Wiggins (granddaughter) PH: [phone]   Emotional Assessment Appearance:: Appears stated age Orientation: : Oriented to Self, Oriented to Place, Oriented to  Time, Oriented to Situation Alcohol / Substance Use: Not Applicable Psych Involvement: No (comment)  Admission diagnosis:  Acute on chronic diastolic (congestive) heart failure (HCC) [I50.33] Acute on chronic congestive heart failure, unspecified heart failure type [facility]) [I50.9] Patient Active Problem List   Diagnosis Date Noted  . Acute on chronic diastolic (congestive) heart  failure ([place]) 07/24/2019  . Acute on chronic congestive heart failure ([place])   . Acute diastolic CHF (congestive heart failure) ([place]) 09/29/2018  . CKD (chronic kidney disease)  stage 3, GFR 30-59 ml/min 09/29/2018  . Acute respiratory failure with hypoxia ([place]) 09/29/2018  . Atrial fibrillation with RVR ([place])   . Hypotension 09/28/2018  . Chronic a-fib with RVR 09/28/2018  . Cerebellar infarct ([place])   . Stroke ([place]) 02/22/2016  . Ischemic stroke ([place]) 02/22/2016  . HFpEF/Chronic diastolic congestive heart failure ([place]) 02/22/2016  . AKI (acute kidney injury) ([place]) 02/22/2016  . Diabetes mellitus with complication ([place])   . Shortness of breath 01/14/2016  . Wheezing 01/14/2016  . Paroxysmal atrial fibrillation ([place]) 11/14/2015  . Cerebrovascular accident (CVA) due to embolism of right posterior cerebral artery ([place]) 08/08/2015  . Essential hypertension 08/08/2015  . HLD (hyperlipidemia) 08/08/2015  . Type 2 diabetes mellitus with circulatory disorder ([place]) 08/08/2015  . PFO (patent foramen ovale)   . ARF (acute renal failure) ([place]) 06/04/2015  . Cerebral infarction due to unspecified mechanism   . Acute CVA (cerebrovascular accident) ([place]) 06/02/2015  . Hypertension 06/02/2015  . Hyperlipidemia 06/02/2015  . CKD (chronic kidney disease) stage 4, GFR 15-29 ml/min (HCC) 06/02/2015  . DM type 2 (diabetes mellitus, type 2) ([place]) 06/02/2015  . Leukocytosis 06/02/2015  . Muscle weakness (generalized) 09/16/2012  . Pain in joint, shoulder region 09/16/2012  . Closed displaced fracture of lateral end of clavicle 08/18/2012   PCP:  Asencion Noble, MD Pharmacy:   [facility], [place] [address] [place] [address] Phone: [phone] Fax: [phone]  Readmission Risk Interventions No flowsheet data found.

## 2019-07-25 NOTE — Progress Notes (Signed)
PROGRESS NOTE   Amy Wiggins  DGL:[id number] DOB: January 22, 1932 DOA: 07/24/2019 PCP: Asencion Noble, MD   Chief Complaint  Patient presents with  . Shortness of Breath    Brief Admission History:  86 y.o. female, with history of paroxysmal atrial fibrillation on chronic anticoagulation with Eliquis, HFpEF, stroke, hypertension, hyperlipidemia, diabetes mellitus type 2, depression, CKD stage IV came to ED with complaints of shortness of breath.   Patient says that her Lasix was stopped about 2 weeks ago by cardiologist PA.  Patient says that since that time she has experienced worsening shortness of breath.  She denies fever or chills.  Denies coughing up any phlegm.  Denies chest pain.  Denies nausea vomiting or diarrhea.  Denies abdominal pain or dysuria.  In the ED chest x-ray showed pulmonary edema, BNP 365, her echocardiogram done in August 2020 showed EF 55 to 60%, indeterminate left ventricular diastolic dysfunction.  Assessment & Plan:   Principal Problem:   Acute on chronic diastolic (congestive) heart failure (HCC) Active Problems:   CKD (chronic kidney disease) stage 4, GFR 15-29 ml/min (HCC)   DM type 2 (diabetes mellitus, type 2) (HCC)   Leukocytosis   Essential hypertension   HLD (hyperlipidemia)   Paroxysmal atrial fibrillation (HCC)   Shortness of breath   Stroke [facility])   HFpEF/Chronic diastolic congestive heart failure ([place])  1. Acute on chronic diastolic CHF - Pt presents with volume overload due being off lasix for several weeks.  Pt unsure why she was told to stop taking it.  We will treat with IV lasix for now and diurese. Will monitor renal function closely.  Pt had 2D echo last year with preserved EF, diastolic function indeterminate.  2. DM type 2 - continue SSI coverage and cBG testing. 3. PAF - apixaban for anticoagulation and cardizem for rate control.  4. Essential hypertension - Losartan temporarily on hold due to renal insufficiency 5. CKD stage IV - follow  creatinine in setting of IV diuresis.    DVT prophylaxis: apixaban  Code Status:  Full  Family Communication:  Disposition:   Status is: Inpatient  Remains inpatient appropriate because:IV lasix required for diuresis.    Dispo: The patient is from: Home              Anticipated d/c is to: Home              Anticipated d/c date is: 2 days              Patient currently is not medically stable to d/c.   Consultants:     Procedures:     Antimicrobials:    Subjective: Pt says the SOB seems to be a little better only.   Objective: Vitals:   07/25/19 0040 07/25/19 0055 07/25/19 0500 07/25/19 0522  BP:  123/63  (!) 111/50  Pulse:  89  85  Resp:  20  (!) 22  Temp:  98.2 F (36.8 C)  97.6 F (36.4 C)  TempSrc:  Oral  Oral  SpO2:  98%  97%  Weight: 85.4 kg  85.7 kg   Height: 5\' 4"  (1.626 m)       Intake/Output Summary (Last 24 hours) at 07/25/2019 1821 Last data filed at 07/25/2019 1349 Gross per 24 hour  Intake 480 ml  Output 1100 ml  Net -620 ml   Filed Weights   07/25/19 0040 07/25/19 0500  Weight: 85.4 kg 85.7 kg    Examination:  General exam: Appears calm  and comfortable  Respiratory system: bibasilar crackles. Respiratory effort normal. Cardiovascular system: S1 & S2 heard, RRR. No JVD, murmurs, rubs, gallops or clicks. No pedal edema. Gastrointestinal system: Abdomen is nondistended, soft and nontender. No organomegaly or masses felt. Normal bowel sounds heard. Central nervous system: Alert and oriented. No focal neurological deficits. Extremities: trace pretibial edema BLEs. Skin: No rashes, lesions or ulcers Psychiatry: Judgement and insight appear normal. Mood & affect appropriate.   Data Reviewed: I have personally reviewed following labs and imaging studies  CBC: Recent Labs  Lab 07/24/19 2047  WBC 13.3*  HGB 9.0*  HCT 31.3*  MCV 89.7  PLT 422*    Basic Metabolic Panel: Recent Labs  Lab 07/20/19 0825 07/24/19 2047 07/25/19 0635    NA 136 131* 131*  K 3.9 4.1 3.4*  CL 97 95* 97*  CO2 21 26 25   GLUCOSE 204* 205* 295*  BUN 34* 41* 39*  CREATININE 2.39* 2.50* 2.43*  CALCIUM 10.4* 9.6 9.1    GFR: Estimated Creatinine Clearance: 17.9 mL/min (A) (by C-G formula based on SCr of 2.43 mg/dL (H)).  Liver Function Tests: No results for input(s): AST, ALT, ALKPHOS, BILITOT, PROT, ALBUMIN in the last 168 hours.  CBG: Recent Labs  Lab 07/25/19 0057 07/25/19 0757 07/25/19 1153 07/25/19 1634  GLUCAP 207* 243* 266* 211*    Recent Results (from the past 240 hour(s))  SARS Coronavirus 2 by RT PCR (hospital order, performed in [facility] hospital lab) Nasopharyngeal Nasopharyngeal Swab     Status: None   Collection Time: 07/24/19  8:31 PM   Specimen: Nasopharyngeal Swab  Result Value Ref Range Status   SARS Coronavirus 2 NEGATIVE NEGATIVE Final    Comment: (NOTE) SARS-CoV-2 target nucleic acids are NOT DETECTED. The SARS-CoV-2 RNA is generally detectable in upper and lower respiratory specimens during the acute phase of infection. The lowest concentration of SARS-CoV-2 viral copies this assay can detect is 250 copies / mL. A negative result does not preclude SARS-CoV-2 infection and should not be used as the sole basis for treatment or other patient management decisions.  A negative result may occur with improper specimen collection / handling, submission of specimen other than nasopharyngeal swab, presence of viral mutation(s) within the areas targeted by this assay, and inadequate number of viral copies (<250 copies / mL). A negative result must be combined with clinical observations, patient history, and epidemiological information. Fact Sheet for Patients:   StrictlyIdeas.no Fact Sheet for Healthcare Providers: BankingDealers.co.za This test is not yet approved or cleared  by the [place] FDA and has been authorized for detection and/or diagnosis of  SARS-CoV-2 by FDA under an Emergency Use Authorization (EUA).  This EUA will remain in effect (meaning this test can be used) for the duration of the COVID-19 declaration under Section 564(b)(1) of the Act, 21 U.S.C. section 360bbb-3(b)(1), unless the authorization is terminated or revoked sooner. Performed at [facility], [address], [place], [place] [address]      Radiology Studies: DG Chest 2 View  Result Date: 07/24/2019 CLINICAL DATA:  86 year old female with shortness of breath. EXAM: CHEST - 2 VIEW COMPARISON:  Chest radiograph dated 10/02/2018. FINDINGS: There is diffuse vascular and interstitial prominence which may represent edema. Confluent areas of air pace density involving the right lung and left lung base may represent edema or pneumonia. Clinical correlation is recommended. Probable small left pleural effusion. No pneumothorax. Stable mild cardiomegaly. A loop recorder device is noted. Atherosclerotic calcification of the aorta. No  acute osseous pathology. Degenerative changes. IMPRESSION: Bilateral pulmonary opacities may represent edema or pneumonia. Clinical correlation is recommended. Electronically Signed   By: Anner Crete M.D.   On: 07/24/2019 20:20   Scheduled Meds: . amiodarone  200 mg Oral Daily  . apixaban  2.5 mg Oral BID  . atorvastatin  20 mg Oral QHS  . buPROPion  300 mg Oral Daily  . citalopram  20 mg Oral Daily  . diltiazem  300 mg Oral Daily  . furosemide  40 mg Intravenous Q12H  . insulin aspart  0-9 Units Subcutaneous TID WC  . insulin glargine  44 Units Subcutaneous Daily  . mirtazapine  15 mg Oral Daily  . pantoprazole  40 mg Oral Daily  . sodium chloride flush  3 mL Intravenous Q12H  . traZODone  150 mg Oral QHS   Continuous Infusions: . sodium chloride       LOS: 1 day   Time spent: 28 mins   Meerab Maselli Wynetta Emery, MD How to contact the [facility] Attending or Consulting provider [place] or covering provider during after hours [place], for  this patient?  1. Check the care team in [facility] and look for a) attending/consulting TRH provider listed and b) the [facility] team listed 2. Log into [email or web address] and use [place]'s universal password to access. If you do not have the password, please contact the hospital operator. 3. Locate the [facility] provider you are looking for under [facility] and page to a number that you can be directly reached. 4. If you still have difficulty reaching the provider, please page the [facility] (Director on Call) for the Hospitalists listed on amion for assistance.  07/25/2019, 6:21 PM

## 2019-07-26 ENCOUNTER — Inpatient Hospital Stay (HOSPITAL_COMMUNITY): Payer: Medicare HMO

## 2019-07-26 ENCOUNTER — Encounter (HOSPITAL_COMMUNITY): Payer: Self-pay | Admitting: Family Medicine

## 2019-07-26 LAB — CBC WITH DIFFERENTIAL/PLATELET
Abs Immature Granulocytes: 0.09 10*3/uL — ABNORMAL HIGH (ref 0.00–0.07)
Basophils Absolute: 0.1 10*3/uL (ref 0.0–0.1)
Basophils Relative: 1 %
Eosinophils Absolute: 0.7 10*3/uL — ABNORMAL HIGH (ref 0.0–0.5)
Eosinophils Relative: 7 %
HCT: 27 % — ABNORMAL LOW (ref 36.0–46.0)
Hemoglobin: 7.8 g/dL — ABNORMAL LOW (ref 12.0–15.0)
Immature Granulocytes: 1 %
Lymphocytes Relative: 13 %
Lymphs Abs: 1.3 10*3/uL (ref 0.7–4.0)
MCH: 25.7 pg — ABNORMAL LOW (ref 26.0–34.0)
MCHC: 28.9 g/dL — ABNORMAL LOW (ref 30.0–36.0)
MCV: 89.1 fL (ref 80.0–100.0)
Monocytes Absolute: 1.3 10*3/uL — ABNORMAL HIGH (ref 0.1–1.0)
Monocytes Relative: 14 %
Neutro Abs: 6.4 10*3/uL (ref 1.7–7.7)
Neutrophils Relative %: 64 %
Platelets: 378 10*3/uL (ref 150–400)
RBC: 3.03 MIL/uL — ABNORMAL LOW (ref 3.87–5.11)
RDW: 15.9 % — ABNORMAL HIGH (ref 11.5–15.5)
WBC: 9.8 10*3/uL (ref 4.0–10.5)
nRBC: 0.2 % (ref 0.0–0.2)

## 2019-07-26 LAB — GLUCOSE, CAPILLARY
Glucose-Capillary: 136 mg/dL — ABNORMAL HIGH (ref 70–99)
Glucose-Capillary: 101 mg/dL — ABNORMAL HIGH (ref 70–99)
Glucose-Capillary: 96 mg/dL (ref 70–99)
Glucose-Capillary: 302 mg/dL — ABNORMAL HIGH (ref 70–99)
Glucose-Capillary: 201 mg/dL — ABNORMAL HIGH (ref 70–99)

## 2019-07-26 LAB — BASIC METABOLIC PANEL
Anion gap: 9 (ref 5–15)
BUN: 41 mg/dL — ABNORMAL HIGH (ref 8–23)
CO2: 28 mmol/L (ref 22–32)
Calcium: 9.3 mg/dL (ref 8.9–10.3)
Chloride: 98 mmol/L (ref 98–111)
Creatinine, Ser: 2.85 mg/dL — ABNORMAL HIGH (ref 0.44–1.00)
GFR calc Af Amer: 17 mL/min — ABNORMAL LOW (ref >60)
GFR calc non Af Amer: 14 mL/min — ABNORMAL LOW (ref >60)
Glucose, Bld: 125 mg/dL — ABNORMAL HIGH (ref 70–99)
Potassium: 3.6 mmol/L (ref 3.5–5.1)
Sodium: 135 mmol/L (ref 135–145)

## 2019-07-26 LAB — MAGNESIUM: Magnesium: 1.8 mg/dL (ref 1.7–2.4)

## 2019-07-26 MED ORDER — IPRATROPIUM-ALBUTEROL 0.5-2.5 (3) MG/3ML IN SOLN: 3.0000 mL | Freq: Three times a day (TID) | RESPIRATORY_TRACT | Status: DC

## 2019-07-26 MED ORDER — IPRATROPIUM-ALBUTEROL 0.5-2.5 (3) MG/3ML IN SOLN
3.0000 mL | Freq: Three times a day (TID) | RESPIRATORY_TRACT | Status: DC
  Administered 2019-07-26 – 2019-07-27 (×5): 3 mL via RESPIRATORY_TRACT
  Filled 2019-07-26 (×6): qty 3

## 2019-07-26 MED ORDER — INSULIN GLARGINE 100 UNIT/ML ~~LOC~~ SOLN
20.0000 [IU] | Freq: Every day | SUBCUTANEOUS | Status: DC
  Administered 2019-07-27 – 2019-07-30 (×4): 20 [IU] via SUBCUTANEOUS
  Filled 2019-07-26 (×7): qty 0.2

## 2019-07-26 NOTE — Progress Notes (Addendum)
PROGRESS NOTE   Amy Wiggins  TKZ:[id number] DOB: [birth year] DOA: 07/24/2019 PCP: Asencion Noble, MD   Chief Complaint  Patient presents with   Shortness of Breath    Brief Admission History:  86 y.o. female, with history of paroxysmal atrial fibrillation on chronic anticoagulation with Eliquis, HFpEF, stroke, hypertension, hyperlipidemia, diabetes mellitus type 2, depression, CKD stage IV came to ED with complaints of shortness of breath.   Patient says that her Lasix was stopped about 2 weeks ago by cardiologist PA.  Patient says that since that time she has experienced worsening shortness of breath.  She denies fever or chills.  Denies coughing up any phlegm.  Denies chest pain.  Denies nausea vomiting or diarrhea.  Denies abdominal pain or dysuria.  In the ED chest x-ray showed pulmonary edema, BNP 365, her echocardiogram done in August 2020 showed EF 55 to 60%, indeterminate left ventricular diastolic dysfunction.  Assessment & Plan:   Principal Problem:   Acute on chronic diastolic (congestive) heart failure (HCC) Active Problems:   CKD (chronic kidney disease) stage 4, GFR 15-29 ml/min (HCC)   DM type 2 (diabetes mellitus, type 2) (HCC)   Leukocytosis   Essential hypertension   HLD (hyperlipidemia)   Paroxysmal atrial fibrillation (HCC)   Shortness of breath   Stroke (HCC)   HFpEF/Chronic diastolic congestive heart failure (HCC)  Acute on chronic diastolic CHF - Pt presents with volume overload due being off lasix for several weeks.  Pt unsure why she was told to stop taking it.  We will treat with IV lasix for now and diurese. She is slowly starting to improve as evidenced by improving appearance of pulmonary edema on chest xray.  Will monitor renal function closely.  Pt had 2D echo last year with preserved EF, diastolic function indeterminate.  DM type 2 - continue SSI coverage and CBG testing.  Not eating well today and lantus was held.  PAF - apixaban for anticoagulation  and cardizem for rate control.  Essential hypertension - Losartan temporarily on hold due to renal insufficiency CKD stage IV - follow creatinine in setting of IV diuresis.  Generalized weakness - PT evaluation requested.    Anemia in CKD  DVT prophylaxis: apixaban  Code Status:  Full  Family Communication:  Disposition:   Status is: Inpatient  Remains inpatient appropriate because: IV lasix required for diuresis.    Dispo: The patient is from: Home              Anticipated d/c is to: Home VS SNF, obtaining PT eval              Anticipated d/c date is: 1-2 days              Patient currently is not medically stable to d/c.   Consultants:    Procedures:    Antimicrobials:    Subjective: Pt reports feeling weak today, not having a good appetite.   Objective: Vitals:   07/25/19 2258 07/26/19 0500 07/26/19 0553 07/26/19 1059  BP: (!) 116/53  (!) 118/50 125/62  Pulse: 84  87 86  Resp: (!) 24  (!) 22   Temp: 98.3 F (36.8 C)  97.8 F (36.6 C)   TempSrc: Oral  Oral   SpO2: 96%  93%   Weight:  85.7 kg    Height:        Intake/Output Summary (Last 24 hours) at 07/26/2019 1117 Last data filed at 07/26/2019 0700 Gross per 24 hour  Intake  480 ml  Output 600 ml  Net -120 ml   Filed Weights   07/25/19 0040 07/25/19 0500 07/26/19 0500  Weight: 85.4 kg 85.7 kg 85.7 kg    Examination:  General exam: Appears calm and comfortable  Respiratory system: bibasilar crackles. Respiratory effort normal. Cardiovascular system: S1 & S2 heard, RRR. No JVD, murmurs, rubs, gallops or clicks. No pedal edema. Gastrointestinal system: Abdomen is nondistended, soft and nontender. No organomegaly or masses felt. Normal bowel sounds heard. Central nervous system: Alert and oriented. No focal neurological deficits. Extremities: trace pretibial edema BLEs. Skin: No rashes, lesions or ulcers Psychiatry: Judgement and insight appear normal. Mood & affect appropriate.   Data Reviewed: I  have personally reviewed following labs and imaging studies  CBC: Recent Labs  Lab 07/24/19 2047 07/26/19 0558  WBC 13.3* 9.8  NEUTROABS  --  6.4  HGB 9.0* 7.8*  HCT 31.3* 27.0*  MCV 89.7 89.1  PLT 422* 062    Basic Metabolic Panel: Recent Labs  Lab 07/20/19 0825 07/24/19 2047 07/25/19 0635 07/26/19 0558  NA 136 131* 131* 135  K 3.9 4.1 3.4* 3.6  CL 97 95* 97* 98  CO2 21 26 25 28   GLUCOSE 204* 205* 295* 125*  BUN 34* 41* 39* 41*  CREATININE 2.39* 2.50* 2.43* 2.85*  CALCIUM 10.4* 9.6 9.1 9.3  MG  --   --   --  1.8    GFR: Estimated Creatinine Clearance: 15.3 mL/min (A) (by C-G formula based on SCr of 2.85 mg/dL (H)).  Liver Function Tests: No results for input(s): AST, ALT, ALKPHOS, BILITOT, PROT, ALBUMIN in the last 168 hours.  CBG: Recent Labs  Lab 07/25/19 1153 07/25/19 1634 07/25/19 2149 07/26/19 0333 07/26/19 0739  GLUCAP 266* 211* 192* 136* 101*    Recent Results (from the past 240 hour(s))  SARS Coronavirus 2 by RT PCR (hospital order, performed in [facility] hospital lab) Nasopharyngeal Nasopharyngeal Swab     Status: None   Collection Time: 07/24/19  8:31 PM   Specimen: Nasopharyngeal Swab  Result Value Ref Range Status   SARS Coronavirus 2 NEGATIVE NEGATIVE Final    Comment: (NOTE) SARS-CoV-2 target nucleic acids are NOT DETECTED. The SARS-CoV-2 RNA is generally detectable in upper and lower respiratory specimens during the acute phase of infection. The lowest concentration of SARS-CoV-2 viral copies this assay can detect is 250 copies / mL. A negative result does not preclude SARS-CoV-2 infection and should not be used as the sole basis for treatment or other patient management decisions.  A negative result may occur with improper specimen collection / handling, submission of specimen other than nasopharyngeal swab, presence of viral mutation(s) within the areas targeted by this assay, and inadequate number of viral copies (<250 copies /  mL). A negative result must be combined with clinical observations, patient history, and epidemiological information. Fact Sheet for Patients:   StrictlyIdeas.no Fact Sheet for Healthcare Providers: BankingDealers.co.za This test is not yet approved or cleared  by the [place] FDA and has been authorized for detection and/or diagnosis of SARS-CoV-2 by FDA under an Emergency Use Authorization (EUA).  This EUA will remain in effect (meaning this test can be used) for the duration of the COVID-19 declaration under Section 564(b)(1) of the Act, 21 U.S.C. section 360bbb-3(b)(1), unless the authorization is terminated or revoked sooner. Performed at [facility], [address], [place], [place] [address]      Radiology Studies: DG Chest 2 View  Result Date: 07/24/2019 CLINICAL  DATA:  86 year old female with shortness of breath. EXAM: CHEST - 2 VIEW COMPARISON:  Chest radiograph dated 10/02/2018. FINDINGS: There is diffuse vascular and interstitial prominence which may represent edema. Confluent areas of air pace density involving the right lung and left lung base may represent edema or pneumonia. Clinical correlation is recommended. Probable small left pleural effusion. No pneumothorax. Stable mild cardiomegaly. A loop recorder device is noted. Atherosclerotic calcification of the aorta. No acute osseous pathology. Degenerative changes. IMPRESSION: Bilateral pulmonary opacities may represent edema or pneumonia. Clinical correlation is recommended. Electronically Signed   By: Anner Crete M.D.   On: 07/24/2019 20:20   DG CHEST PORT 1 VIEW  Result Date: 07/26/2019 CLINICAL DATA:  Pulmonary edema EXAM: PORTABLE CHEST 1 VIEW COMPARISON:  Jul 24, 2019 FINDINGS: Again noted is cardiomegaly. There is slight interval improvement in the fluffy airspace opacities and interstitial opacities throughout both lungs, right greater than left. No pleural  effusion. No acute osseous abnormality. IMPRESSION: Slight interval improvement in the diffuse pulmonary edema, right greater than left. Electronically Signed   By: Prudencio Pair M.D.   On: 07/26/2019 05:05   Scheduled Meds:  amiodarone  200 mg Oral Daily   apixaban  2.5 mg Oral BID   atorvastatin  20 mg Oral QHS   buPROPion  300 mg Oral Daily   citalopram  20 mg Oral Daily   diltiazem  300 mg Oral Daily   furosemide  40 mg Intravenous Q12H   insulin aspart  0-15 Units Subcutaneous TID WC   insulin aspart  0-5 Units Subcutaneous QHS   insulin aspart  6 Units Subcutaneous TID WC   insulin glargine  44 Units Subcutaneous Daily   ipratropium-albuterol  3 mL Nebulization TID   mirtazapine  15 mg Oral Daily   pantoprazole  40 mg Oral Daily   sodium chloride flush  3 mL Intravenous Q12H   traZODone  150 mg Oral QHS   Continuous Infusions:  sodium chloride       LOS: 2 days   Time spent: 22 mins  Ninette Cotta Wynetta Emery, MD How to contact the [facility] Attending or Consulting provider 7A - 7P or covering provider during after hours [place], for this patient?  Check the care team in [facility] and look for a) attending/consulting TRH provider listed and b) the [facility] team listed Log into [email or web address] and use [place]'s universal password to access. If you do not have the password, please contact the hospital operator. Locate the [facility] provider you are looking for under [facility] and page to a number that you can be directly reached. If you still have difficulty reaching the provider, please page the [facility] (Director on Call) for the Hospitalists listed on amion for assistance.  07/26/2019, 11:17 AM

## 2019-07-26 NOTE — Plan of Care (Signed)
  Problem: Acute Rehab PT Goals(only PT should resolve) Goal: Pt Will Go Supine/Side To Sit Outcome: Progressing Flowsheets (Taken 07/26/2019 1530) Pt will go Supine/Side to Sit: with supervision Goal: Pt Will Go Sit To Supine/Side Outcome: Progressing Flowsheets (Taken 07/26/2019 1530) Pt will go Sit to Supine/Side: with supervision Goal: Patient Will Transfer Sit To/From Stand Outcome: Progressing Flowsheets (Taken 07/26/2019 1530) Patient will transfer sit to/from stand: with min guard assist Goal: Pt Will Transfer Bed To Chair/Chair To Bed Outcome: Progressing Flowsheets (Taken 07/26/2019 1530) Pt will Transfer Bed to Chair/Chair to Bed: min guard assist Goal: Pt Will Ambulate Outcome: Progressing Flowsheets (Taken 07/26/2019 1530) Pt will Ambulate:  100 feet  with min guard assist  with least restrictive assistive device Goal: Pt/caregiver will Perform Home Exercise Program Outcome: Progressing Flowsheets (Taken 07/26/2019 1530) Pt/caregiver will Perform Home Exercise Program:  For increased strengthening  For improved balance  Independently  3:31 PM, 07/26/19 Mearl Latin PT, DPT Physical Therapist at [facility]

## 2019-07-26 NOTE — Evaluation (Signed)
Physical Therapy Evaluation Patient Details Name: Amy Wiggins MRN: [id number] DOB: 05/18/1931 Today's Date: 07/26/2019   History of Present Illness  Amy Wiggins  is a 86 y.o. female, with history of paroxysmal atrial fibrillation on chronic anticoagulation with Eliquis, HFpEF, stroke, hypertension, hyperlipidemia, diabetes mellitus type 2, depression, CKD stage IV came to ED with complaints of shortness of breath.  Patient says that her Lasix was stopped about 2 weeks ago by cardiologist PA.  Patient says that since that time she has experienced worsening shortness of breath.  She denies fever or chills.  Denies coughing up any phlegm.  Denies chest pain.  Denies nausea vomiting or diarrhea.  Denies abdominal pain or dysuria.    Clinical Impression  Patient limited for functional mobility as stated below secondary to BLE weakness, fatigue and impaired standing balance. Patient requires min assist for bed mobility secondary to weakness. She requires physical assist and RW to transfer to standing and c/o dizziness upon standing which resolves quickly. She tolerates ambulating with RW without loss of balance and is limited by fatigue. Patient ends session demonstrating good sitting tolerance EOB. She is returned to bed and granddaughter was present for session. Patient will benefit from continued physical therapy in hospital and recommended venue below to increase strength, balance, endurance for safe ADLs and gait.     Follow Up Recommendations Home health PT;Supervision for mobility/OOB;Supervision - Intermittent    Equipment Recommendations  None recommended by PT    Recommendations for Other Services       Precautions / Restrictions Precautions Precautions: Fall Restrictions Weight Bearing Restrictions: No      Mobility  Bed Mobility Overal bed mobility: Needs Assistance Bed Mobility: Supine to Sit;Sit to Supine     Supine to sit: Min assist Sit to supine: Min assist    General bed mobility comments: min assist needed to transfer to seated EOB  Transfers Overall transfer level: Needs assistance Equipment used: Rolling walker (2 wheeled) Transfers: Sit to/from [facility] Sit to Stand: Min assist Stand pivot transfers: Min assist       General transfer comment: min assist to transfer to standing with RW, slight dizziness upon initial standing  Ambulation/Gait Ambulation/Gait assistance: Min assist Gait Distance (Feet): 60 Feet Assistive device: Rolling walker (2 wheeled) Gait Pattern/deviations: Step-through pattern;Decreased stride length;Drifts right/left Gait velocity: decreased   General Gait Details: slow, labored cadence with RW, some drifting L/R while ambulating, no loss of balance  Stairs            Wheelchair Mobility    Modified Rankin (Stroke Patients Only)       Balance Overall balance assessment: Needs assistance Sitting-balance support: Feet supported Sitting balance-Leahy Scale: Good Sitting balance - Comments: seated EOB   Standing balance support: Bilateral upper extremity supported Standing balance-Leahy Scale: Fair Standing balance comment: fair using RW                             Pertinent Vitals/Pain Pain Assessment: No/denies pain    Home Living Family/patient expects to be discharged to:: Private residence Living Arrangements: Other relatives(grandaughter) Available Help at Discharge: Family;Available PRN/intermittently Type of Home: House Home Access: Ramped entrance     Home Layout: Two level;Able to live on main level with bedroom/bathroom Home Equipment: Kasandra Knudsen - single point;Walker - 4 wheels;Shower seat - built in      Prior Function Level of Independence: Needs assistance   Gait / Transfers  Assistance Needed: household ambulator with RW  ADL's / Homemaking Assistance Needed: assisted by family        Hand Dominance        Extremity/Trunk Assessment    Upper Extremity Assessment Upper Extremity Assessment: Generalized weakness    Lower Extremity Assessment Lower Extremity Assessment: Generalized weakness    Cervical / Trunk Assessment Cervical / Trunk Assessment: Normal  Communication   Communication: No difficulties  Cognition Arousal/Alertness: Awake/alert Behavior During Therapy: WFL for tasks assessed/performed;Flat affect Overall Cognitive Status: Within Functional Limits for tasks assessed                                        General Comments      Exercises     Assessment/Plan    PT Assessment Patient needs continued PT services  PT Problem List Decreased strength;Decreased mobility;Decreased activity tolerance;Decreased balance       PT Treatment Interventions DME instruction;Therapeutic exercise;Gait training;Balance training;Stair training;Neuromuscular re-education;Functional mobility training;Therapeutic activities;Patient/family education    PT Goals (Current goals can be found in the Care Plan section)  Acute Rehab PT Goals Patient Stated Goal: Return home PT Goal Formulation: With patient/family Time For Goal Achievement: 08/09/19 Potential to Achieve Goals: Good    Frequency Min 3X/week   Barriers to discharge        Co-evaluation               AM-PAC PT "6 Clicks" Mobility  Outcome Measure Help needed turning from your back to your side while in a flat bed without using bedrails?: None Help needed moving from lying on your back to sitting on the side of a flat bed without using bedrails?: A Little Help needed moving to and from a bed to a chair (including a wheelchair)?: A Little Help needed standing up from a chair using your arms (e.g., wheelchair or bedside chair)?: A Little Help needed to walk in hospital room?: A Little Help needed climbing 3-5 steps with a railing? : A Lot 6 Click Score: 18    End of Session Equipment Utilized During Treatment: Gait  belt Activity Tolerance: Patient tolerated treatment well Patient left: in bed;with family/visitor present;with call bell/phone within reach;with bed alarm set Nurse Communication: Mobility status PT Visit Diagnosis: Unsteadiness on feet (R26.81);Other abnormalities of gait and mobility (R26.89);Muscle weakness (generalized) (M62.81)    Time: 3704-8889 PT Time Calculation (min) (ACUTE ONLY): 17 min   Charges:   PT Evaluation $PT Eval Low Complexity: 1 Low PT Treatments $Therapeutic Activity: 8-22 mins        3:28 PM, 07/26/19 Mearl Latin PT, DPT Physical Therapist at [facility]

## 2019-07-27 ENCOUNTER — Telehealth: Payer: Self-pay | Admitting: Internal Medicine

## 2019-07-27 ENCOUNTER — Inpatient Hospital Stay (HOSPITAL_COMMUNITY): Payer: Medicare HMO

## 2019-07-27 LAB — CBC
HCT: 26.5 % — ABNORMAL LOW (ref 36.0–46.0)
Hemoglobin: 7.7 g/dL — ABNORMAL LOW (ref 12.0–15.0)
MCH: 25.8 pg — ABNORMAL LOW (ref 26.0–34.0)
MCHC: 29.1 g/dL — ABNORMAL LOW (ref 30.0–36.0)
MCV: 88.9 fL (ref 80.0–100.0)
Platelets: 392 10*3/uL (ref 150–400)
RBC: 2.98 MIL/uL — ABNORMAL LOW (ref 3.87–5.11)
RDW: 15.9 % — ABNORMAL HIGH (ref 11.5–15.5)
WBC: 8.9 10*3/uL (ref 4.0–10.5)
nRBC: 0 % (ref 0.0–0.2)

## 2019-07-27 LAB — BASIC METABOLIC PANEL
Anion gap: 11 (ref 5–15)
BUN: 47 mg/dL — ABNORMAL HIGH (ref 8–23)
CO2: 27 mmol/L (ref 22–32)
Calcium: 9 mg/dL (ref 8.9–10.3)
Chloride: 97 mmol/L — ABNORMAL LOW (ref 98–111)
Creatinine, Ser: 2.83 mg/dL — ABNORMAL HIGH (ref 0.44–1.00)
GFR calc Af Amer: 17 mL/min — ABNORMAL LOW (ref >60)
GFR calc non Af Amer: 15 mL/min — ABNORMAL LOW (ref >60)
Glucose, Bld: 144 mg/dL — ABNORMAL HIGH (ref 70–99)
Potassium: 3.4 mmol/L — ABNORMAL LOW (ref 3.5–5.1)
Sodium: 135 mmol/L (ref 135–145)

## 2019-07-27 LAB — GLUCOSE, CAPILLARY
Glucose-Capillary: 118 mg/dL — ABNORMAL HIGH (ref 70–99)
Glucose-Capillary: 139 mg/dL — ABNORMAL HIGH (ref 70–99)
Glucose-Capillary: 278 mg/dL — ABNORMAL HIGH (ref 70–99)
Glucose-Capillary: 179 mg/dL — ABNORMAL HIGH (ref 70–99)
Glucose-Capillary: 157 mg/dL — ABNORMAL HIGH (ref 70–99)

## 2019-07-27 LAB — MAGNESIUM: Magnesium: 1.8 mg/dL (ref 1.7–2.4)

## 2019-07-27 MED ORDER — IPRATROPIUM-ALBUTEROL 0.5-2.5 (3) MG/3ML IN SOLN
3.0000 mL | Freq: Four times a day (QID) | RESPIRATORY_TRACT | Status: DC | PRN
  Administered 2019-07-30: 3 mL via RESPIRATORY_TRACT
  Filled 2019-07-27: qty 3

## 2019-07-27 MED ORDER — POTASSIUM CHLORIDE CRYS ER 20 MEQ PO TBCR
40.0000 meq | EXTENDED_RELEASE_TABLET | Freq: Once | ORAL | Status: AC
  Administered 2019-07-27: 40 meq via ORAL
  Filled 2019-07-27: qty 2

## 2019-07-27 MED ORDER — FUROSEMIDE 10 MG/ML IJ SOLN
40.0000 mg | Freq: Every day | INTRAMUSCULAR | Status: DC
  Administered 2019-07-28: 40 mg via INTRAVENOUS
  Filled 2019-07-27: qty 4

## 2019-07-27 NOTE — Telephone Encounter (Signed)
Follow up   Patient's grand daughter is returning call from 07/23/2019 per the previous message. Please call.

## 2019-07-27 NOTE — TOC Progression Note (Signed)
Transition of Care [facility]) - Progression Note    Patient Details  Name: LANIQUA TORRENS MRN: [id number] Date of Birth: 22-Nov-1931  Transition of [facility]) CM/SW Contact  Salome Arnt, [place] Phone Number: 07/27/2019, 3:30 PM  Clinical Narrative:  PT recommending home health. Discussed with pt and referred to Tim at [facility]. Will follow.      Expected Discharge Plan: [place] Barriers to Discharge: Continued Medical Work up  Expected Discharge Plan and Services Expected Discharge Plan: [place] Choice: [place] arrangements for the past 2 months: Single Family Home                                       Social Determinants of Health (SDOH) Interventions    Readmission Risk Interventions No flowsheet data found.

## 2019-07-27 NOTE — Progress Notes (Addendum)
   07/27/19 1004  Vitals  Pulse Rate 91  Pulse Rate Source Dinamap  BP (!) 105/48     Provider notified of low DBP.  Asked the provider if Cardizem should be held.  Awaiting response from provider.  Provider verbally ordered to hold Cardizem.  Cardizem not given.

## 2019-07-27 NOTE — Care Management Important Message (Signed)
Important Message  Patient Details  Name: Amy Wiggins MRN: [id number] Date of Birth: Mar 11, 1931   Medicare Important Message Given:  Yes     [name] 07/27/2019, 4:10 PM

## 2019-07-27 NOTE — Progress Notes (Signed)
Physical Therapy Treatment Patient Details Name: Amy Wiggins MRN: [id number] DOB: [birth year] Today's Date: 07/27/2019    History of Present Illness Amy Wiggins  is a 86 y.o. female, with history of paroxysmal atrial fibrillation on chronic anticoagulation with Eliquis, HFpEF, stroke, hypertension, hyperlipidemia, diabetes mellitus type 2, depression, CKD stage IV came to ED with complaints of shortness of breath.  Patient says that her Lasix was stopped about 2 weeks ago by cardiologist PA.  Patient says that since that time she has experienced worsening shortness of breath.  She denies fever or chills.  Denies coughing up any phlegm.  Denies chest pain.  Denies nausea vomiting or diarrhea.  Denies abdominal pain or dysuria.    PT Comments    Pt tired upon arrival, but motivated to complete therapy. Pt tolerates therapeutic exercises with verbal cues for form; attempted on RA with SpO2 desat to 87% so returned 2L and cued pt on pursed lip breathing and able to rebound to 92%. Pt transfers to [facility] to void bladder with min guard assist and no AD using BUE to power up from seated surfaces. Pt able to take short, slow steps in room with HHA, but declines further ambulation due to being tired. Pt on 2L with SpO2 93-96% with mobility. Pt tolerates remaining up in chair at EOS with call bell in lap and chair alarm on. Pt will benefit from continued physical therapy in hospital and recommendations below to increase strength, balance, endurance for safe ADLs and gait.   Follow Up Recommendations  Home health PT;Supervision for mobility/OOB;Supervision - Intermittent     Equipment Recommendations  None recommended by PT    Recommendations for Other Services       Precautions / Restrictions Precautions Precautions: Fall Restrictions Weight Bearing Restrictions: No    Mobility  Bed Mobility Overal bed mobility: Needs Assistance Bed Mobility: Supine to Sit  Supine to sit: Min assist;HOB  elevated  General bed mobility comments: min assist to fully upright trunk with HOB elevated and bedrail use  Transfers Overall transfer level: Needs assistance Equipment used: None Transfers: Sit to/from [facility] Sit to Stand: Min guard Stand pivot transfers: Min guard  General transfer comment: able to rise from seated surfaces with BUE assisting to rise, bed<>BSC transfer to void bladder with good hand placement  Ambulation/Gait Ambulation/Gait assistance: Min assist  Assistive device: 1 person hand held assist Gait Pattern/deviations: Step-through pattern;Decreased stride length Gait velocity: decreased  General Gait Details: limited to short, slow steps in room with handheld assist, mild increased work of breathing with steps and pt defers further ambluation 2* being tired   Marine scientist Rankin (Stroke Patients Only)       Balance Overall balance assessment: Needs assistance Sitting-balance support: Feet supported;No upper extremity supported Sitting balance-Leahy Scale: Good Sitting balance - Comments: seated EOB   Standing balance support: During functional activity;Single extremity supported Standing balance-Leahy Scale: Fair Standing balance comment: with HHA         Cognition Arousal/Alertness: Awake/alert Behavior During Therapy: WFL for tasks assessed/performed Overall Cognitive Status: Within Functional Limits for tasks assessed        General Comments: Pt is tired, but engages during session      Exercises General Exercises - Lower Extremity Ankle Circles/Pumps: Supine;Both;10 reps Long Arc Quad: Seated;Both;10 reps Hip ABduction/ADduction: Seated;Both;10 reps(LE extended in recliner) Straight Leg Raises: Supine;Both;10 reps  General Comments General comments (skin integrity, edema, etc.): on 2L with SpO2 93-96% with mobility, decrease to 87% with therapeutic exercise but able to  rebound to 92% with pursed lip breathing and return of 2L O2      Pertinent Vitals/Pain Pain Assessment: No/denies pain    Home Living               Prior Function            PT Goals (current goals can now be found in the care plan section) Acute Rehab PT Goals Patient Stated Goal: Return home PT Goal Formulation: With patient/family Time For Goal Achievement: 08/09/19 Potential to Achieve Goals: Good Progress towards PT goals: Progressing toward goals    Frequency    Min 3X/week      PT Plan Current plan remains appropriate    Co-evaluation              AM-PAC PT "6 Clicks" Mobility   Outcome Measure  Help needed turning from your back to your side while in a flat bed without using bedrails?: None Help needed moving from lying on your back to sitting on the side of a flat bed without using bedrails?: A Little Help needed moving to and from a bed to a chair (including a wheelchair)?: A Little Help needed standing up from a chair using your arms (e.g., wheelchair or bedside chair)?: A Little Help needed to walk in hospital room?: A Little Help needed climbing 3-5 steps with a railing? : A Lot 6 Click Score: 18    End of Session Equipment Utilized During Treatment: Gait belt   Patient left: in chair;with call bell/phone within reach;with chair alarm set Nurse Communication: Mobility status PT Visit Diagnosis: Unsteadiness on feet (R26.81);Other abnormalities of gait and mobility (R26.89);Muscle weakness (generalized) (M62.81)     Time: 1040-1104 PT Time Calculation (min) (ACUTE ONLY): 24 min  Charges:  $Therapeutic Exercise: 8-22 mins $Therapeutic Activity: 8-22 mins                      Tori Zebadiah Willert PT, DPT 07/27/19, 12:28 PM [phone]

## 2019-07-27 NOTE — Progress Notes (Signed)
PROGRESS NOTE   Amy Wiggins  WIO:[id number] DOB: 05-May-1931 DOA: 07/24/2019 PCP: Asencion Noble, MD   Chief Complaint  Patient presents with  . Shortness of Breath    Brief Admission History:  86 y.o. female, with history of paroxysmal atrial fibrillation on chronic anticoagulation with Eliquis, HFpEF, stroke, hypertension, hyperlipidemia, diabetes mellitus type 2, depression, CKD stage IV came to ED with complaints of shortness of breath.   Patient says that her Lasix was stopped about 2 weeks ago by cardiologist PA.  Patient says that since that time she has experienced worsening shortness of breath.  She denies fever or chills.  Denies coughing up any phlegm.  Denies chest pain.  Denies nausea vomiting or diarrhea.  Denies abdominal pain or dysuria.  In the ED chest x-ray showed pulmonary edema, BNP 365, her echocardiogram done in August 2020 showed EF 55 to 60%, indeterminate left ventricular diastolic dysfunction.  Assessment & Plan:   Principal Problem:   Acute on chronic diastolic (congestive) heart failure (HCC) Active Problems:   CKD (chronic kidney disease) stage 4, GFR 15-29 ml/min (HCC)   DM type 2 (diabetes mellitus, type 2) (HCC)   Leukocytosis   Essential hypertension   HLD (hyperlipidemia)   Paroxysmal atrial fibrillation (HCC)   Shortness of breath   Stroke [facility])   HFpEF/Chronic diastolic congestive heart failure ([place])  1. Acute on chronic diastolic CHF - Pt presents with volume overload due being off lasix for several weeks.  Pt unsure why she was told to stop taking it.  We will treat with IV lasix for now and diurese. She is slowly starting to improve as evidenced by improving appearance of pulmonary edema on chest xray.  Will monitor renal function closely.  Pt had 2D echo last year with preserved EF, diastolic function indeterminate.  Plan to send home on oral lasix.  2. DM type 2 - continue SSI coverage and CBG testing.   3. PAF - apixaban for anticoagulation  and cardizem for rate control.  4. Essential hypertension - Losartan temporarily on hold due to renal insufficiency and soft BPs.  5. CKD stage IV - follow creatinine in setting of IV diuresis it has been stable.  6. Generalized weakness - PT recommending HHPT.    7. Anemia in CKD  DVT prophylaxis: apixaban  Code Status:  Full  Family Communication: daughter at bedside 5/30, 5/31, 6/1 updated Disposition:   Status is: Inpatient  Remains inpatient appropriate because: IV lasix required for diuresis.    Dispo: The patient is from: Home              Anticipated d/c is to: Home health PT (Home)               Anticipated d/c date is: 1 day              Patient currently is not medically stable to d/c.  She likely can discharge home tomorrow if remains stable.   Consultants:     Procedures:     Antimicrobials:    Subjective: Pt says that her breathing seems a little better today, no audible wheezes like yesterday, still urinating well.   Objective: Vitals:   07/27/19 0830 07/27/19 1004 07/27/19 1313 07/27/19 1401  BP:  (!) 105/48 (!) 104/45   Pulse:  91 86   Resp:   16   Temp:   98.5 F (36.9 C)   TempSrc:   Oral   SpO2: 94%  98% 95%  Weight:      Height:        Intake/Output Summary (Last 24 hours) at 07/27/2019 1534 Last data filed at 07/27/2019 1300 Gross per 24 hour  Intake 240 ml  Output 1852 ml  Net -1612 ml   Filed Weights   07/25/19 0500 07/26/19 0500 07/27/19 0509  Weight: 85.7 kg 85.7 kg 87.5 kg    Examination:  General exam: Appears calm and comfortable  Respiratory system: BBS mostly clear, diminished  BS left base. Respiratory effort normal. Cardiovascular system: S1 & S2 heard, RRR. No JVD, murmurs, rubs, gallops or clicks. No pedal edema. Gastrointestinal system: Abdomen is nondistended, soft and nontender. No organomegaly or masses felt. Normal bowel sounds heard. Central nervous system: Alert and oriented. No focal neurological  deficits. Extremities: trace pretibial edema BLEs. Skin: No rashes, lesions or ulcers Psychiatry: Judgement and insight appear normal. Mood & affect appropriate.   Data Reviewed: I have personally reviewed following labs and imaging studies  CBC: Recent Labs  Lab 07/24/19 2047 07/26/19 0558 07/27/19 0514  WBC 13.3* 9.8 8.9  NEUTROABS  --  6.4  --   HGB 9.0* 7.8* 7.7*  HCT 31.3* 27.0* 26.5*  MCV 89.7 89.1 88.9  PLT 422* 378 696    Basic Metabolic Panel: Recent Labs  Lab 07/24/19 2047 07/25/19 0635 07/26/19 0558 07/27/19 0514  NA 131* 131* 135 135  K 4.1 3.4* 3.6 3.4*  CL 95* 97* 98 97*  CO2 26 25 28 27   GLUCOSE 205* 295* 125* 144*  BUN 41* 39* 41* 47*  CREATININE 2.50* 2.43* 2.85* 2.83*  CALCIUM 9.6 9.1 9.3 9.0  MG  --   --  1.8 1.8    GFR: Estimated Creatinine Clearance: 15.6 mL/min (A) (by C-G formula based on SCr of 2.83 mg/dL (H)).  Liver Function Tests: No results for input(s): AST, ALT, ALKPHOS, BILITOT, PROT, ALBUMIN in the last 168 hours.  CBG: Recent Labs  Lab 07/26/19 1620 07/26/19 2026 07/27/19 0300 07/27/19 0727 07/27/19 1111  GLUCAP 302* 201* 118* 139* 278*    Recent Results (from the past 240 hour(s))  SARS Coronavirus 2 by RT PCR (hospital order, performed in [facility] hospital lab) Nasopharyngeal Nasopharyngeal Swab     Status: None   Collection Time: 07/24/19  8:31 PM   Specimen: Nasopharyngeal Swab  Result Value Ref Range Status   SARS Coronavirus 2 NEGATIVE NEGATIVE Final    Comment: (NOTE) SARS-CoV-2 target nucleic acids are NOT DETECTED. The SARS-CoV-2 RNA is generally detectable in upper and lower respiratory specimens during the acute phase of infection. The lowest concentration of SARS-CoV-2 viral copies this assay can detect is 250 copies / mL. A negative result does not preclude SARS-CoV-2 infection and should not be used as the sole basis for treatment or other patient management decisions.  A negative result may occur  with improper specimen collection / handling, submission of specimen other than nasopharyngeal swab, presence of viral mutation(s) within the areas targeted by this assay, and inadequate number of viral copies (<250 copies / mL). A negative result must be combined with clinical observations, patient history, and epidemiological information. Fact Sheet for Patients:   StrictlyIdeas.no Fact Sheet for Healthcare Providers: BankingDealers.co.za This test is not yet approved or cleared  by the [place] FDA and has been authorized for detection and/or diagnosis of SARS-CoV-2 by FDA under an Emergency Use Authorization (EUA).  This EUA will remain in effect (meaning this test can be used) for the duration of  the COVID-19 declaration under Section 564(b)(1) of the Act, 21 U.S.C. section 360bbb-3(b)(1), unless the authorization is terminated or revoked sooner. Performed at [facility], [address] [place], [place] [address]      Radiology Studies: DG CHEST PORT 1 VIEW  Result Date: 07/27/2019 CLINICAL DATA:  Patient admitted 07/24/2019 with shortness of breath. EXAM: PORTABLE CHEST 1 VIEW COMPARISON:  CT chest 09/29/2018. Single-view of the chest 07/24/2019 and 07/26/2019. FINDINGS: Loop recorder again noted. Cardiomegaly is also again seen. Trace bilateral pleural effusions. Aeration in the right chest continues to improved. The left lung is clear. Atherosclerosis noted. IMPRESSION: Continued improvement in aeration in the right chest. Trace bilateral pleural effusions. Cardiomegaly. Aortic Atherosclerosis (ICD10-I70.0). Electronically Signed   By: Inge Rise M.D.   On: 07/27/2019 09:21   DG CHEST PORT 1 VIEW  Result Date: 07/26/2019 CLINICAL DATA:  Pulmonary edema EXAM: PORTABLE CHEST 1 VIEW COMPARISON:  Jul 24, 2019 FINDINGS: Again noted is cardiomegaly. There is slight interval improvement in the fluffy airspace opacities and  interstitial opacities throughout both lungs, right greater than left. No pleural effusion. No acute osseous abnormality. IMPRESSION: Slight interval improvement in the diffuse pulmonary edema, right greater than left. Electronically Signed   By: Prudencio Pair M.D.   On: 07/26/2019 05:05   Scheduled Meds: . amiodarone  200 mg Oral Daily  . apixaban  2.5 mg Oral BID  . atorvastatin  20 mg Oral QHS  . buPROPion  300 mg Oral Daily  . citalopram  20 mg Oral Daily  . diltiazem  300 mg Oral Daily  . furosemide  40 mg Intravenous Q12H  . insulin aspart  0-15 Units Subcutaneous TID WC  . insulin aspart  0-5 Units Subcutaneous QHS  . insulin aspart  6 Units Subcutaneous TID WC  . insulin glargine  20 Units Subcutaneous Daily  . ipratropium-albuterol  3 mL Nebulization TID  . mirtazapine  15 mg Oral Daily  . pantoprazole  40 mg Oral Daily  . sodium chloride flush  3 mL Intravenous Q12H  . traZODone  150 mg Oral QHS   Continuous Infusions: . sodium chloride       LOS: 3 days   Time spent: 22 mins  Pinchus Weckwerth Wynetta Emery, MD How to contact the [facility] Attending or Consulting provider [place] or covering provider during after hours [place], for this patient?  1. Check the care team in [facility] and look for a) attending/consulting [facility] provider listed and b) the [facility] team listed 2. Log into [email or web address] and use [place]'s universal password to access. If you do not have the password, please contact the hospital operator. 3. Locate the [facility] provider you are looking for under [facility] and page to a number that you can be directly reached. 4. If you still have difficulty reaching the provider, please page the [facility] (Director on Call) for the Hospitalists listed on amion for assistance.  07/27/2019, 3:34 PM

## 2019-07-28 ENCOUNTER — Inpatient Hospital Stay (HOSPITAL_COMMUNITY): Payer: Medicare HMO

## 2019-07-28 DIAGNOSIS — I5031 Acute diastolic (congestive) heart failure: Secondary | ICD-10-CM

## 2019-07-28 DIAGNOSIS — Z7189 Other specified counseling: Secondary | ICD-10-CM

## 2019-07-28 DIAGNOSIS — E782 Mixed hyperlipidemia: Secondary | ICD-10-CM

## 2019-07-28 DIAGNOSIS — Z515 Encounter for palliative care: Secondary | ICD-10-CM

## 2019-07-28 DIAGNOSIS — J9601 Acute respiratory failure with hypoxia: Secondary | ICD-10-CM

## 2019-07-28 LAB — CBC
HCT: 27.2 % — ABNORMAL LOW (ref 36.0–46.0)
Hemoglobin: 7.9 g/dL — ABNORMAL LOW (ref 12.0–15.0)
MCH: 26.1 pg (ref 26.0–34.0)
MCHC: 29 g/dL — ABNORMAL LOW (ref 30.0–36.0)
MCV: 89.8 fL (ref 80.0–100.0)
Platelets: 399 10*3/uL (ref 150–400)
RBC: 3.03 MIL/uL — ABNORMAL LOW (ref 3.87–5.11)
RDW: 15.9 % — ABNORMAL HIGH (ref 11.5–15.5)
WBC: 9.1 10*3/uL (ref 4.0–10.5)
nRBC: 0 % (ref 0.0–0.2)

## 2019-07-28 LAB — BASIC METABOLIC PANEL
Anion gap: 7 (ref 5–15)
BUN: 46 mg/dL — ABNORMAL HIGH (ref 8–23)
CO2: 30 mmol/L (ref 22–32)
Calcium: 9.4 mg/dL (ref 8.9–10.3)
Chloride: 98 mmol/L (ref 98–111)
Creatinine, Ser: 2.64 mg/dL — ABNORMAL HIGH (ref 0.44–1.00)
GFR calc Af Amer: 18 mL/min — ABNORMAL LOW (ref >60)
GFR calc non Af Amer: 16 mL/min — ABNORMAL LOW (ref >60)
Glucose, Bld: 166 mg/dL — ABNORMAL HIGH (ref 70–99)
Potassium: 4.9 mmol/L (ref 3.5–5.1)
Sodium: 135 mmol/L (ref 135–145)

## 2019-07-28 LAB — GLUCOSE, CAPILLARY
Glucose-Capillary: 154 mg/dL — ABNORMAL HIGH (ref 70–99)
Glucose-Capillary: 148 mg/dL — ABNORMAL HIGH (ref 70–99)
Glucose-Capillary: 257 mg/dL — ABNORMAL HIGH (ref 70–99)
Glucose-Capillary: 88 mg/dL (ref 70–99)
Glucose-Capillary: 193 mg/dL — ABNORMAL HIGH (ref 70–99)

## 2019-07-28 LAB — ECHOCARDIOGRAM COMPLETE
Height: 64 in
Weight: 3086.44 oz

## 2019-07-28 MED ORDER — FUROSEMIDE 10 MG/ML IJ SOLN
40.0000 mg | Freq: Two times a day (BID) | INTRAMUSCULAR | Status: DC
  Administered 2019-07-28 – 2019-08-01 (×8): 40 mg via INTRAVENOUS
  Filled 2019-07-28 (×8): qty 4

## 2019-07-28 NOTE — Progress Notes (Signed)
*  PRELIMINARY RESULTS* Echocardiogram 2D Echocardiogram has been performed.  Leavy Cella 07/28/2019, 1:15 PM

## 2019-07-28 NOTE — Care Management Important Message (Signed)
Important Message  Patient Details  Name: Amy Wiggins MRN: [id number] Date of Birth: November 24, 1931   Medicare Important Message Given:  Yes     [name] 07/28/2019, 4:32 PM

## 2019-07-28 NOTE — Progress Notes (Signed)
PROGRESS NOTE  Amy Wiggins ELF:[id number] DOB: 1931-07-31 DOA: 07/24/2019 PCP: Asencion Noble, MD  Brief History:  86 year old female with history of CKD 4, diabetes mellitus type 2, hypertension, hyperlipidemia, paroxysmal atrial fibrillation, stroke, PFO presenting with 2 to 3-day history of shortness of breath and generalized weakness.  Patient says that her Lasix was stopped about 2 weeks ago by cardiologist PA.  Patient says that since that time she has experienced worsening shortness of breath.  Assessment/Plan: Acute respiratory failure with hypoxia -Secondary to CHF -Presently stable on 2 L nasal cannula -Wean oxygen for saturation 92% -Personally reviewed chest x-ray--increased interstitial markings  Acute on chronic diastolic CHF -58/52/7782 echo EF 55-60%, grade 1 DD, no WMA -Continue IV furosemide--increase to bid -Accurate I's and O's -Patient stated that she weighed 202 pounds at home -09/29/18 echo--EF 55-60%, moderated MS  Atrial fibrillation with RVR -CHADSVASc = 7 -continue apixaban -continue cardizem CD -continue amio -07/09/19 afib clinic weight 85.3 kg (188 lb)  Leukocytosis -Suspect stress demargination -she is afebrile and hemodynamically stable -WBC improved  CKD stage 4 -Baseline creatinine 2.3-2.5 -Monitor with diuresis -A.m. BMP  Uncontrolled diabetes mellitus type 2 with hyperglycemia -Continue Lantus reduced dose -Continue NovoLog sliding scale -07/25/19 hemoglobin A1c 8.4  Depression/anxiety -Continue Celexa   Hyperlipidemia -Continue statin      Status is: Inpatient  Remains inpatient appropriate because:IV treatments appropriate due to intensity of illness or inability to take PO. Remains fluid overloaded   Dispo: The patient is from: Home              Anticipated d/c is to: Home              Anticipated d/c date is: 1 day              Patient currently is not medically stable to d/c.         Family  Communication:   No Family at bedside  Consultants:  none  Code Status:  FULL   DVT Prophylaxis:  apixaban    Procedures: As Listed in Progress Note Above  Antibiotics: None       Subjective: Patient states that she is still short of breath with minimal exertion.  She denies any chest pain, nausea, vomiting, diarrhea, abdominal pain, headache, fevers, chills.  There is no coughing or hemoptysis.  Objective: Vitals:   07/27/19 2028 07/28/19 0507 07/28/19 0907 07/28/19 0954  BP: (!) 107/57 (!) 111/42  (!) 110/56  Pulse: 88 95  92  Resp: 20 16  20   Temp: 97.9 F (36.6 C) 98.2 F (36.8 C)    TempSrc: Oral Oral    SpO2: 98% 96% 94% 98%  Weight:  87.5 kg    Height:        Intake/Output Summary (Last 24 hours) at 07/28/2019 1030 Last data filed at 07/28/2019 0900 Gross per 24 hour  Intake --  Output 1152 ml  Net -1152 ml   Weight change: 0 kg Exam:   General:  Pt is alert, follows commands appropriately, not in acute distress  HEENT: No icterus, No thrush, No neck mass, /AT  Cardiovascular: RRR, S1/S2, no rubs, no gallops  Respiratory: Bilateral crackles.  No wheezing.  Good air movement  Abdomen: Soft/+BS, non tender, non distended, no guarding  Extremities: Trace lower extremity edema, No lymphangitis, No petechiae, No rashes, no synovitis   Data Reviewed: I have personally reviewed following labs and imaging studies Basic  Metabolic Panel: Recent Labs  Lab 07/24/19 2047 07/25/19 0635 07/26/19 0558 07/27/19 0514 07/28/19 0542  NA 131* 131* 135 135 135  K 4.1 3.4* 3.6 3.4* 4.9  CL 95* 97* 98 97* 98  CO2 26 25 28 27 30   GLUCOSE 205* 295* 125* 144* 166*  BUN 41* 39* 41* 47* 46*  CREATININE 2.50* 2.43* 2.85* 2.83* 2.64*  CALCIUM 9.6 9.1 9.3 9.0 9.4  MG  --   --  1.8 1.8  --    Liver Function Tests: No results for input(s): AST, ALT, ALKPHOS, BILITOT, PROT, ALBUMIN in the last 168 hours. No results for input(s): LIPASE, AMYLASE in the last 168  hours. No results for input(s): AMMONIA in the last 168 hours. Coagulation Profile: No results for input(s): INR, PROTIME in the last 168 hours. CBC: Recent Labs  Lab 07/24/19 2047 07/26/19 0558 07/27/19 0514 07/28/19 0542  WBC 13.3* 9.8 8.9 9.1  NEUTROABS  --  6.4  --   --   HGB 9.0* 7.8* 7.7* 7.9*  HCT 31.3* 27.0* 26.5* 27.2*  MCV 89.7 89.1 88.9 89.8  PLT 422* 378 392 399   Cardiac Enzymes: No results for input(s): CKTOTAL, CKMB, CKMBINDEX, TROPONINI in the last 168 hours. BNP: Invalid input(s): POCBNP CBG: Recent Labs  Lab 07/27/19 1111 07/27/19 1707 07/27/19 2026 07/28/19 0259 07/28/19 0817  GLUCAP 278* 179* 157* 154* 148*   HbA1C: No results for input(s): HGBA1C in the last 72 hours. Urine analysis:    Component Value Date/Time   COLORURINE YELLOW 09/28/2018 1216   APPEARANCEUR HAZY (A) 09/28/2018 1216   LABSPEC 1.007 09/28/2018 1216   PHURINE 5.0 09/28/2018 1216   GLUCOSEU >=500 (A) 09/28/2018 1216   HGBUR NEGATIVE 09/28/2018 1216   BILIRUBINUR NEGATIVE 09/28/2018 1216   KETONESUR NEGATIVE 09/28/2018 1216   PROTEINUR NEGATIVE 09/28/2018 1216   UROBILINOGEN 0.2 12/12/2010 1102   NITRITE NEGATIVE 09/28/2018 1216   LEUKOCYTESUR MODERATE (A) 09/28/2018 1216   Sepsis Labs: @LABRCNTIP (procalcitonin:4,lacticidven:4) ) Recent Results (from the past 240 hour(s))  SARS Coronavirus 2 by RT PCR (hospital order, performed in [place] hospital lab) Nasopharyngeal Nasopharyngeal Swab     Status: None   Collection Time: 07/24/19  8:31 PM   Specimen: Nasopharyngeal Swab  Result Value Ref Range Status   SARS Coronavirus 2 NEGATIVE NEGATIVE Final    Comment: (NOTE) SARS-CoV-2 target nucleic acids are NOT DETECTED. The SARS-CoV-2 RNA is generally detectable in upper and lower respiratory specimens during the acute phase of infection. The lowest concentration of SARS-CoV-2 viral copies this assay can detect is 250 copies / mL. A negative result does not preclude  SARS-CoV-2 infection and should not be used as the sole basis for treatment or other patient management decisions.  A negative result may occur with improper specimen collection / handling, submission of specimen other than nasopharyngeal swab, presence of viral mutation(s) within the areas targeted by this assay, and inadequate number of viral copies (<250 copies / mL). A negative result must be combined with clinical observations, patient history, and epidemiological information. Fact Sheet for Patients:   StrictlyIdeas.no Fact Sheet for Healthcare Providers: BankingDealers.co.za This test is not yet approved or cleared  by the [place] FDA and has been authorized for detection and/or diagnosis of SARS-CoV-2 by FDA under an Emergency Use Authorization (EUA).  This EUA will remain in effect (meaning this test can be used) for the duration of the COVID-19 declaration under Section 564(b)(1) of the Act, 21 U.S.C. section 360bbb-3(b)(1), unless the  authorization is terminated or revoked sooner. Performed at [facility], [address], [place], [place] [address]      Scheduled Meds: . amiodarone  200 mg Oral Daily  . apixaban  2.5 mg Oral BID  . atorvastatin  20 mg Oral QHS  . buPROPion  300 mg Oral Daily  . citalopram  20 mg Oral Daily  . diltiazem  300 mg Oral Daily  . furosemide  40 mg Intravenous Daily  . insulin aspart  0-15 Units Subcutaneous TID WC  . insulin aspart  0-5 Units Subcutaneous QHS  . insulin aspart  6 Units Subcutaneous TID WC  . insulin glargine  20 Units Subcutaneous Daily  . mirtazapine  15 mg Oral Daily  . pantoprazole  40 mg Oral Daily  . sodium chloride flush  3 mL Intravenous Q12H  . traZODone  150 mg Oral QHS   Continuous Infusions: . sodium chloride      Procedures/Studies: DG Chest 2 View  Result Date: 07/24/2019 CLINICAL DATA:  86 year old female with shortness of breath. EXAM: CHEST - 2  VIEW COMPARISON:  Chest radiograph dated 10/02/2018. FINDINGS: There is diffuse vascular and interstitial prominence which may represent edema. Confluent areas of air pace density involving the right lung and left lung base may represent edema or pneumonia. Clinical correlation is recommended. Probable small left pleural effusion. No pneumothorax. Stable mild cardiomegaly. A loop recorder device is noted. Atherosclerotic calcification of the aorta. No acute osseous pathology. Degenerative changes. IMPRESSION: Bilateral pulmonary opacities may represent edema or pneumonia. Clinical correlation is recommended. Electronically Signed   By: Anner Crete M.D.   On: 07/24/2019 20:20   DG CHEST PORT 1 VIEW  Result Date: 07/27/2019 CLINICAL DATA:  Patient admitted 07/24/2019 with shortness of breath. EXAM: PORTABLE CHEST 1 VIEW COMPARISON:  CT chest 09/29/2018. Single-view of the chest 07/24/2019 and 07/26/2019. FINDINGS: Loop recorder again noted. Cardiomegaly is also again seen. Trace bilateral pleural effusions. Aeration in the right chest continues to improved. The left lung is clear. Atherosclerosis noted. IMPRESSION: Continued improvement in aeration in the right chest. Trace bilateral pleural effusions. Cardiomegaly. Aortic Atherosclerosis (ICD10-I70.0). Electronically Signed   By: Inge Rise M.D.   On: 07/27/2019 09:21   DG CHEST PORT 1 VIEW  Result Date: 07/26/2019 CLINICAL DATA:  Pulmonary edema EXAM: PORTABLE CHEST 1 VIEW COMPARISON:  Jul 24, 2019 FINDINGS: Again noted is cardiomegaly. There is slight interval improvement in the fluffy airspace opacities and interstitial opacities throughout both lungs, right greater than left. No pleural effusion. No acute osseous abnormality. IMPRESSION: Slight interval improvement in the diffuse pulmonary edema, right greater than left. Electronically Signed   By: Prudencio Pair M.D.   On: 07/26/2019 05:05    Orson Eva, DO  [facility]  If 7PM-7AM,  please contact night-coverage [email or web address] Password [email or web address] 07/28/2019, 10:30 AM   LOS: 4 days

## 2019-07-28 NOTE — Consult Note (Signed)
Consultation Note Date: 07/28/2019   Patient Name: Amy Wiggins  DOB: May 10, 1931  MRN: [id number]  Age / Sex: 86 y.o., female  PCP: Asencion Noble, MD Referring Physician: Orson Eva, MD  Reason for Consultation: Establishing goals of care  HPI/Patient Profile: 85 y.o. female  with past medical history of stroke, atrial fibrillation on Eliquis, HFpEF, hypertension, hyperlipidemia, CKD stage IV, diabetes, depression admitted on 07/24/2019 with shortness of breath after Lasix placed on hold due to worsening renal function prior to admission. Continues with shortness of breath even with IV diuresis. Palliative care requested to assist with goals of care conversation.   Clinical Assessment and Goals of Care: I have reviewed electronic records including diagnostics, results, vital signs, and notes.   I met today at Amy Wiggins's bedside. No family or visitors present. She is very sleepy and lethargic (she did not awaken to voice when I came to visit with her earlier today). She does awaken to talk with me now but she does struggle to stay awake during our conversation. When I ask how she is feeling she responds "okay I guess." Responds the same when asked about her breathing. I explained palliative medicine as a support to patient and families dealing with serious illness and difficult decisions. I explained my concern with the difficult balance with her heart failure and kidney failure and that as time goes on this balance becomes more and more difficult and can lead to worsening health and quality of life.   When we discuss her goals of care she wants to "get better" and return to her home where she lives with her granddaughter. Her main support is her sister, Amy Wiggins (who she identified as her Air traffic controller), and her granddaughter, Amy Wiggins. She had a son and a daughter but they are both deceased. When discussing  her own mortality she reports that she has not thought much about this and has never discussed with her family. She has not completed a Living Will and is not interested in this at current time. We discussed code status and she tells me that she wants all measures to keep her alive. I explained that sometimes as our health worsens our quality of life worsen and sometimes it does not make sense to go through aggressive measures such as resuscitation if this is not expected to bring [place] back to an acceptable quality of life. She verbalizes understanding. I encouraged her to continue thinking about this issues and her wishes and that it is helpful to discuss with family as well.   All questions/concerns addressed. Emotional support provided. Ms. Eischeid gives me permission to return tomorrow and continue our discussion then (hopefully she will be more awake and alert to have a better conversation).   Primary Decision Maker PATIENT    SUMMARY OF RECOMMENDATIONS   - Full aggressive care desired   Code Status/Advance Care Planning:  Full code   Symptom Management:   Per attending  Palliative Prophylaxis:   Bowel Regimen, Delirium Protocol and Turn Reposition  Additional Recommendations (Limitations, Scope, Preferences):  Full Scope Treatment  Psycho-social/Spiritual:   Desire for further Chaplaincy support:no  Additional Recommendations: Caregiving  Support/Resources  Prognosis:   Overall prognosis guarded with heart and renal failure.   Discharge Planning: To Be Determined      Primary Diagnoses: Present on Admission: . Acute on chronic diastolic (congestive) heart failure ([place]) . CKD (chronic kidney disease) stage 4, GFR 15-29 ml/min (HCC) . Essential hypertension . HFpEF/Chronic diastolic congestive heart failure ([place]) . HLD (hyperlipidemia) . Leukocytosis . Paroxysmal atrial fibrillation (HCC) . Shortness of breath . Stroke ([place]) . Acute respiratory failure with  hypoxia ([place])   I have reviewed the medical record, interviewed the patient and family, and examined the patient. The following aspects are pertinent.  Past Medical History:  Diagnosis Date  . Chronic anticoagulation   . Depression   . Diabetes mellitus   . Hyperlipidemia   . Hypertension   . PAF (paroxysmal atrial fibrillation) ([place])   . PFO (patent foramen ovale)   . Stroke ([place])    TIA's x 2   Social History   Socioeconomic History  . Marital status: Widowed    Spouse name: Not on file  . Number of children: Not on file  . Years of education: Not on file  . Highest education level: Not on file  Occupational History  . Not on file  Tobacco Use  . Smoking status: Never Smoker  . Smokeless tobacco: Never Used  Substance and Sexual Activity  . Alcohol use: No  . Drug use: No  . Sexual activity: Never  Other Topics Concern  . Not on file  Social History Narrative  . Not on file   Social Determinants of Health   Financial Resource Strain:   . Difficulty of Paying Living Expenses:   Food Insecurity:   . Worried About Charity fundraiser in the Last Year:   . Arboriculturist in the Last Year:   Transportation Needs:   . Film/video editor (Medical):   Marland Kitchen Lack of Transportation (Non-Medical):   Physical Activity:   . Days of Exercise per Week:   . Minutes of Exercise per Session:   Stress:   . Feeling of Stress :   Social Connections:   . Frequency of Communication with Friends and Family:   . Frequency of Social Gatherings with Friends and Family:   . Attends Religious Services:   . Active Member of Clubs or Organizations:   . Attends Archivist Meetings:   Marland Kitchen Marital Status:    Family History  Problem Relation Age of Onset  . Stroke Father   . Dementia Sister    Scheduled Meds: . amiodarone  200 mg Oral Daily  . apixaban  2.5 mg Oral BID  . atorvastatin  20 mg Oral QHS  . buPROPion  300 mg Oral Daily  . citalopram  20 mg Oral Daily  .  diltiazem  300 mg Oral Daily  . furosemide  40 mg Intravenous BID  . insulin aspart  0-15 Units Subcutaneous TID WC  . insulin aspart  0-5 Units Subcutaneous QHS  . insulin aspart  6 Units Subcutaneous TID WC  . insulin glargine  20 Units Subcutaneous Daily  . mirtazapine  15 mg Oral Daily  . pantoprazole  40 mg Oral Daily  . sodium chloride flush  3 mL Intravenous Q12H  . traZODone  150 mg Oral QHS   Continuous Infusions: . sodium chloride  PRN Meds:.sodium chloride, acetaminophen, ipratropium-albuterol, ondansetron (ZOFRAN) IV, sodium chloride flush No Known Allergies Review of Systems  Constitutional: Positive for activity change and fatigue. Negative for appetite change.  Respiratory: Positive for shortness of breath.   Neurological: Positive for weakness.    Physical Exam Vitals and nursing note reviewed.  Constitutional:      General: She is not in acute distress.    Appearance: She is ill-appearing.     Comments: Sleepy   Cardiovascular:     Rate and Rhythm: Normal rate.  Pulmonary:     Effort: Pulmonary effort is normal. No tachypnea, accessory muscle usage or respiratory distress.     Comments: No distress at rest.  Abdominal:     Palpations: Abdomen is soft.  Neurological:     Mental Status: She is alert and oriented to person, place, and time.     Vital Signs: BP (!) 110/56 (BP Location: Left Arm)   Pulse 92   Temp 98.2 F (36.8 C) (Oral)   Resp 20   Ht _0  (1.626 m)   Wt 87.5 kg   SpO2 98%   BMI 33.11 kg/m  Pain Scale: 0-10   Pain Score: 0-No pain   SpO2: SpO2: 98 % O2 Device:SpO2: 98 % O2 Flow Rate: .O2 Flow Rate (L/min): 3 L/min  IO: Intake/output summary:   Intake/Output Summary (Last 24 hours) at 07/28/2019 1316 Last data filed at 07/28/2019 1100 Gross per 24 hour  Intake 243 ml  Output 1750 ml  Net -1507 ml    LBM: Last BM Date: 07/25/19 Baseline Weight: Weight: 85.4 kg Most recent weight: Weight: 87.5 kg     Palliative  Assessment/Data:     Time In: 1430 Time Out: 1520 Time Total: 50 min Greater than 50%  of this time was spent counseling and coordinating care related to the above assessment and plan.  Signed by: Vinie Sill, NP Palliative Medicine Team Pager # [phone] (M-F 8a-5p) Team Phone # [phone] (Nights/Weekends)

## 2019-07-29 LAB — BASIC METABOLIC PANEL
Anion gap: 11 (ref 5–15)
BUN: 47 mg/dL — ABNORMAL HIGH (ref 8–23)
CO2: 30 mmol/L (ref 22–32)
Calcium: 9.4 mg/dL (ref 8.9–10.3)
Chloride: 94 mmol/L — ABNORMAL LOW (ref 98–111)
Creatinine, Ser: 2.51 mg/dL — ABNORMAL HIGH (ref 0.44–1.00)
GFR calc Af Amer: 20 mL/min — ABNORMAL LOW (ref >60)
GFR calc non Af Amer: 17 mL/min — ABNORMAL LOW (ref >60)
Glucose, Bld: 147 mg/dL — ABNORMAL HIGH (ref 70–99)
Potassium: 3.8 mmol/L (ref 3.5–5.1)
Sodium: 135 mmol/L (ref 135–145)

## 2019-07-29 LAB — CBC
HCT: 26.6 % — ABNORMAL LOW (ref 36.0–46.0)
Hemoglobin: 7.6 g/dL — ABNORMAL LOW (ref 12.0–15.0)
MCH: 25.2 pg — ABNORMAL LOW (ref 26.0–34.0)
MCHC: 28.6 g/dL — ABNORMAL LOW (ref 30.0–36.0)
MCV: 88.4 fL (ref 80.0–100.0)
Platelets: 409 10*3/uL — ABNORMAL HIGH (ref 150–400)
RBC: 3.01 MIL/uL — ABNORMAL LOW (ref 3.87–5.11)
RDW: 15.6 % — ABNORMAL HIGH (ref 11.5–15.5)
WBC: 7.7 10*3/uL (ref 4.0–10.5)
nRBC: 0 % (ref 0.0–0.2)

## 2019-07-29 LAB — GLUCOSE, CAPILLARY
Glucose-Capillary: 149 mg/dL — ABNORMAL HIGH (ref 70–99)
Glucose-Capillary: 127 mg/dL — ABNORMAL HIGH (ref 70–99)
Glucose-Capillary: 232 mg/dL — ABNORMAL HIGH (ref 70–99)
Glucose-Capillary: 123 mg/dL — ABNORMAL HIGH (ref 70–99)
Glucose-Capillary: 232 mg/dL — ABNORMAL HIGH (ref 70–99)

## 2019-07-29 MED ORDER — DILTIAZEM HCL 30 MG PO TABS
30.0000 mg | ORAL_TABLET | Freq: Four times a day (QID) | ORAL | Status: DC
  Administered 2019-07-29 – 2019-07-30 (×3): 30 mg via ORAL
  Filled 2019-07-29 (×3): qty 1

## 2019-07-29 NOTE — Progress Notes (Signed)
Pt's current SaO2 98% on 2lpm [place]. Resp even and nonlabored at 18//min. Pt's O2 decreased to 1lpm per order to wean pt from oxygen. Will recheck SaO2 within 30 minutes.

## 2019-07-29 NOTE — Progress Notes (Signed)
Pt has been in bed today, does not want to be up in recliner because, "I'm just too tired today." Pt has been up to [facility] with assistance three times today, voiding clear yellow urine. Pt unsteady on her feet, requires one assist and verbal reminders to move her feet and to put hand on bed/chair, etc., to verify that she is in front of bed or chair before sitting down.  Chest clear today, pt continues to have SaO2 94-96% on 1 lpm [place].

## 2019-07-29 NOTE — Progress Notes (Signed)
PROGRESS NOTE  Amy Wiggins SAY:[id number] DOB: 07-Dec-1931 DOA: 07/24/2019 PCP: Asencion Noble, MD   Brief History:  86 year old female with history of CKD 4, diabetes mellitus type 2, hypertension, hyperlipidemia, paroxysmal atrial fibrillation, stroke, PFO presenting with 2 to 3-day history of shortness of breath and generalized weakness.  Patient says that her Lasix was stopped about 2 weeks ago by cardiologist PA.Patient says that since that time she has experienced worsening shortness of breath.  Assessment/Plan: Acute respiratory failure with hypoxia -Secondary to CHF -Presently stable on 2 L nasal cannula -Wean oxygen for saturation 92% -Personally reviewed chest x-ray--increased interstitial markings  Acute on chronic diastolic CHF -23/55/7322 echo EF 55-60%, grade 1 DD, no WMA -Continue IV furosemide--increase to bid -Accurate I's and O's -Patient stated that she weighed 202 pounds at home -09/29/18 echo--EF 55-60%, moderated MS  Atrial fibrillation with RVR, paroxysmal -CHADSVASc = 7 -continue apixaban -discontinue cardizem CD due to soft BP -start lower dose cardizem 30 mg po q6 -continue amio -07/09/19 afib clinic weight 85.3 kg (188 lb)  Leukocytosis -Suspect stress demargination -she is afebrile and hemodynamically stable -WBC improved  CKD stage 4 -Baseline creatinine 2.3-2.5 -Monitor with diuresis -A.m. BMP  Uncontrolled diabetes mellitus type 2 with hyperglycemia -Continue Lantus reduced dose -Continue NovoLog sliding scale -07/25/19 hemoglobin A1c 8.4  Depression/anxiety -Continue Celexa  -d/c mirtazapine due to lethargy  Hyperlipidemia -Continue statin      Status is: Inpatient  Remains inpatient appropriate because:IV treatments appropriate due to intensity of illness or inability to take PO. Remains fluid overloaded   Dispo: The patient is from: Home  Anticipated d/c is to: Home   Anticipated d/c date is: 1-2 day  Patient currently is not medically stable to d/c.  Remains fluid overloaded         Family Communication:   No Family at bedside  Consultants:  none  Code Status:  FULL   DVT Prophylaxis:  apixaban    Procedures: As Listed in Progress Note Above  Antibiotics: None     Subjective: Pt c/o fatigue and lethargy today.  She states breathing is improving but remains sob with exertion.  Denies f/c, cp, n/v/d, abd pain, headache  Objective: Vitals:   07/29/19 0700 07/29/19 0959 07/29/19 1029 07/29/19 1345  BP:  (!) 103/57  (!) 113/49  Pulse:  75 77 77  Resp:  18 20 18   Temp:    98.9 F (37.2 C)  TempSrc:    Oral  SpO2:  98% 95% 98%  Weight: 83.5 kg     Height:        Intake/Output Summary (Last 24 hours) at 07/29/2019 1739 Last data filed at 07/29/2019 1230 Gross per 24 hour  Intake 366 ml  Output 550 ml  Net -184 ml   Weight change: -4 kg Exam:   General:  Pt is alert, follows commands appropriately, not in acute distress  HEENT: No icterus, No thrush, No neck mass, [place]/AT  Cardiovascular: RRR, S1/S2, no rubs, no gallops  Respiratory: bibasilar crackles. No wheeze  Abdomen: Soft/+BS, non tender, non distended, no guarding  Extremities: No edema, No lymphangitis, No petechiae, No rashes, no synovitis   Data Reviewed: I have personally reviewed following labs and imaging studies Basic Metabolic Panel: Recent Labs  Lab 07/25/19 0635 07/26/19 0558 07/27/19 0514 07/28/19 0542 07/29/19 0624  NA 131* 135 135 135 135  K 3.4* 3.6 3.4* 4.9 3.8  CL 97* 98 97*  98 94*  CO2 25 28 27 30 30   GLUCOSE 295* 125* 144* 166* 147*  BUN 39* 41* 47* 46* 47*  CREATININE 2.43* 2.85* 2.83* 2.64* 2.51*  CALCIUM 9.1 9.3 9.0 9.4 9.4  MG  --  1.8 1.8  --   --    Liver Function Tests: No results for input(s): AST, ALT, ALKPHOS, BILITOT, PROT, ALBUMIN in the last 168 hours. No results for input(s): LIPASE,  AMYLASE in the last 168 hours. No results for input(s): AMMONIA in the last 168 hours. Coagulation Profile: No results for input(s): INR, PROTIME in the last 168 hours. CBC: Recent Labs  Lab 07/24/19 2047 07/26/19 0558 07/27/19 0514 07/28/19 0542 07/29/19 0624  WBC 13.3* 9.8 8.9 9.1 7.7  NEUTROABS  --  6.4  --   --   --   HGB 9.0* 7.8* 7.7* 7.9* 7.6*  HCT 31.3* 27.0* 26.5* 27.2* 26.6*  MCV 89.7 89.1 88.9 89.8 88.4  PLT 422* 378 392 399 409*   Cardiac Enzymes: No results for input(s): CKTOTAL, CKMB, CKMBINDEX, TROPONINI in the last 168 hours. BNP: Invalid input(s): POCBNP CBG: Recent Labs  Lab 07/28/19 2121 07/29/19 0323 07/29/19 0758 07/29/19 1131 07/29/19 1646  GLUCAP 193* 149* 127* 232* 123*   HbA1C: No results for input(s): HGBA1C in the last 72 hours. Urine analysis:    Component Value Date/Time   COLORURINE YELLOW 09/28/2018 1216   APPEARANCEUR HAZY (A) 09/28/2018 1216   LABSPEC 1.007 09/28/2018 1216   PHURINE 5.0 09/28/2018 1216   GLUCOSEU >=500 (A) 09/28/2018 1216   HGBUR NEGATIVE 09/28/2018 1216   BILIRUBINUR NEGATIVE 09/28/2018 1216   KETONESUR NEGATIVE 09/28/2018 1216   PROTEINUR NEGATIVE 09/28/2018 1216   UROBILINOGEN 0.2 12/12/2010 1102   NITRITE NEGATIVE 09/28/2018 1216   LEUKOCYTESUR MODERATE (A) 09/28/2018 1216   Sepsis Labs: @LABRCNTIP (procalcitonin:4,lacticidven:4) ) Recent Results (from the past 240 hour(s))  SARS Coronavirus 2 by RT PCR (hospital order, performed in [place] hospital lab) Nasopharyngeal Nasopharyngeal Swab     Status: None   Collection Time: 07/24/19  8:31 PM   Specimen: Nasopharyngeal Swab  Result Value Ref Range Status   SARS Coronavirus 2 NEGATIVE NEGATIVE Final    Comment: (NOTE) SARS-CoV-2 target nucleic acids are NOT DETECTED. The SARS-CoV-2 RNA is generally detectable in upper and lower respiratory specimens during the acute phase of infection. The lowest concentration of SARS-CoV-2 viral copies this assay  can detect is 250 copies / mL. A negative result does not preclude SARS-CoV-2 infection and should not be used as the sole basis for treatment or other patient management decisions.  A negative result may occur with improper specimen collection / handling, submission of specimen other than nasopharyngeal swab, presence of viral mutation(s) within the areas targeted by this assay, and inadequate number of viral copies (<250 copies / mL). A negative result must be combined with clinical observations, patient history, and epidemiological information. Fact Sheet for Patients:   StrictlyIdeas.no Fact Sheet for Healthcare Providers: BankingDealers.co.za This test is not yet approved or cleared  by the [place] FDA and has been authorized for detection and/or diagnosis of SARS-CoV-2 by FDA under an Emergency Use Authorization (EUA).  This EUA will remain in effect (meaning this test can be used) for the duration of the COVID-19 declaration under Section 564(b)(1) of the Act, 21 U.S.C. section 360bbb-3(b)(1), unless the authorization is terminated or revoked sooner. Performed at [facility], [address] [place], [place] [address]      Scheduled Meds: .  amiodarone  200 mg Oral Daily  . apixaban  2.5 mg Oral BID  . atorvastatin  20 mg Oral QHS  . buPROPion  300 mg Oral Daily  . citalopram  20 mg Oral Daily  . diltiazem  30 mg Oral Q6H  . furosemide  40 mg Intravenous BID  . insulin aspart  0-15 Units Subcutaneous TID WC  . insulin aspart  0-5 Units Subcutaneous QHS  . insulin aspart  6 Units Subcutaneous TID WC  . insulin glargine  20 Units Subcutaneous Daily  . mirtazapine  15 mg Oral Daily  . pantoprazole  40 mg Oral Daily  . sodium chloride flush  3 mL Intravenous Q12H  . traZODone  150 mg Oral QHS   Continuous Infusions: . sodium chloride      Procedures/Studies: DG Chest 2 View  Result Date: 07/24/2019 CLINICAL DATA:   86 year old female with shortness of breath. EXAM: CHEST - 2 VIEW COMPARISON:  Chest radiograph dated 10/02/2018. FINDINGS: There is diffuse vascular and interstitial prominence which may represent edema. Confluent areas of air pace density involving the right lung and left lung base may represent edema or pneumonia. Clinical correlation is recommended. Probable small left pleural effusion. No pneumothorax. Stable mild cardiomegaly. A loop recorder device is noted. Atherosclerotic calcification of the aorta. No acute osseous pathology. Degenerative changes. IMPRESSION: Bilateral pulmonary opacities may represent edema or pneumonia. Clinical correlation is recommended. Electronically Signed   By: Anner Crete M.D.   On: 07/24/2019 20:20   DG CHEST PORT 1 VIEW  Result Date: 07/27/2019 CLINICAL DATA:  Patient admitted 07/24/2019 with shortness of breath. EXAM: PORTABLE CHEST 1 VIEW COMPARISON:  CT chest 09/29/2018. Single-view of the chest 07/24/2019 and 07/26/2019. FINDINGS: Loop recorder again noted. Cardiomegaly is also again seen. Trace bilateral pleural effusions. Aeration in the right chest continues to improved. The left lung is clear. Atherosclerosis noted. IMPRESSION: Continued improvement in aeration in the right chest. Trace bilateral pleural effusions. Cardiomegaly. Aortic Atherosclerosis (ICD10-I70.0). Electronically Signed   By: Inge Rise M.D.   On: 07/27/2019 09:21   DG CHEST PORT 1 VIEW  Result Date: 07/26/2019 CLINICAL DATA:  Pulmonary edema EXAM: PORTABLE CHEST 1 VIEW COMPARISON:  Jul 24, 2019 FINDINGS: Again noted is cardiomegaly. There is slight interval improvement in the fluffy airspace opacities and interstitial opacities throughout both lungs, right greater than left. No pleural effusion. No acute osseous abnormality. IMPRESSION: Slight interval improvement in the diffuse pulmonary edema, right greater than left. Electronically Signed   By: Prudencio Pair M.D.   On: 07/26/2019  05:05   ECHOCARDIOGRAM COMPLETE  Result Date: 07/28/2019    ECHOCARDIOGRAM REPORT   Patient Name:   JENY NIELD Date of Exam: 07/28/2019 Medical Rec #:  [id number]         Height:       64.0 in Accession #:    [id number]        Weight:       192.9 lb Date of Birth:  Oct 07, 1931        BSA:          1.927 m Patient Age:    20 years          BP:           110/56 mmHg Patient Gender: F                 HR:           93 bpm. Exam [facility] Procedure: 2D Echo Indications:    CHF-Acute Diastolic 315.40 / G86.76  History:        Patient has prior history of Echocardiogram examinations, most                 recent 09/29/2018. Stroke, Arrythmias:Atrial Fibrillation; Risk                 Factors:Diabetes, Dyslipidemia, Hypertension and Non-Smoker. PFO                 (patent foramen ovale.  Sonographer:    Leavy Cella RDCS (AE) Referring Phys: [phone] [place] IMPRESSIONS  1. Left ventricular ejection fraction, by estimation, is 60 to 65%. The left ventricle has normal function. The left ventricle has no regional wall motion abnormalities. There is moderate left ventricular hypertrophy. Left ventricular diastolic parameters are consistent with Grade II diastolic dysfunction (pseudonormalization). Elevated left atrial pressure.  2. Right ventricular systolic function is normal. The right ventricular size is normal.  3. Left atrial size was severely dilated.  4. The MV itself is poorly visualized, unable to corroborate elevated gradient morphologically. Consider TEE if clinically indicated. . The mitral valve was not well visualized. Trivial mitral valve regurgitation. Moderate to severe mitral stenosis by mean gradient. Mean gradient 10 mmHg, HR 85  5. The aortic valve is tricuspid. Aortic valve regurgitation is not visualized. No aortic stenosis is present. FINDINGS  Left Ventricle: Left ventricular ejection fraction, by estimation, is 60 to 65%. The left ventricle has normal function. The left ventricle has  no regional wall motion abnormalities. The left ventricular internal cavity size was normal in size. There is  moderate left ventricular hypertrophy. Left ventricular diastolic parameters are consistent with Grade II diastolic dysfunction (pseudonormalization). Elevated left atrial pressure. Right Ventricle: The right ventricular size is normal. No increase in right ventricular wall thickness. Right ventricular systolic function is normal. Left Atrium: Left atrial size was severely dilated. Right Atrium: Right atrial size was not well visualized. Pericardium: There is no evidence of pericardial effusion. Mitral Valve: The MV itself is poorly visualized, unable to corroborate elevated gradient morphologically. Consider TEE if clinically indicated. The mitral valve was not well visualized. There is moderate thickening of the mitral valve leaflet(s). There is moderate calcification of the mitral valve leaflet(s). Moderate mitral annular calcification. Trivial mitral valve regurgitation. Moderate to severe mitral stenosis by mean gradient. Mean gradient 10 mmHg, HR 85 mitral valve stenosis. MV peak gradient, 15.1 mmHg. The mean mitral valve gradient is 10.0 mmHg. Tricuspid Valve: The tricuspid valve is not well visualized. Tricuspid valve regurgitation is not demonstrated. No evidence of tricuspid stenosis. Aortic Valve: The aortic valve is tricuspid. . There is moderate thickening and moderate calcification of the aortic valve. Aortic valve regurgitation is not visualized. No aortic stenosis is present. Moderate aortic valve annular calcification. There is  moderate thickening of the aortic valve. There is moderate calcification of the aortic valve. Aortic valve mean gradient measures 7.3 mmHg. Aortic valve peak gradient measures 15.3 mmHg. Aortic valve area, by VTI measures 2.65 cm. Pulmonic Valve: The pulmonic valve was not well visualized. Pulmonic valve regurgitation is not visualized. No evidence of pulmonic  stenosis. Aorta: The aortic root is normal in size and structure. Pulmonary Artery: Indeterminant PASP, inadequate TR jet. Venous: The inferior vena cava was not well visualized. IAS/Shunts: The interatrial septum was not well visualized.  LEFT VENTRICLE PLAX 2D LVIDd:         4.48 cm  Diastology  LVIDs:         2.81 cm  LV e' lateral:   6.31 cm/s LV PW:         1.30 cm  LV E/e' lateral: 31.2 LV IVS:        1.25 cm  LV e' medial:    5.11 cm/s LVOT diam:     2.00 cm  LV E/e' medial:  38.6 LV SV:         88 LV SV Index:   46 LVOT Area:     3.14 cm  RIGHT VENTRICLE RV S prime:     14.00 cm/s TAPSE (M-mode): 1.9 cm LEFT ATRIUM             Index       RIGHT ATRIUM           Index LA diam:        4.90 cm 2.54 cm/m  RA Area:     15.20 cm LA Vol (A2C):   59.7 ml 30.99 ml/m RA Volume:   37.50 ml  19.46 ml/m LA Vol (A4C):   93.1 ml 48.32 ml/m LA Biplane Vol: 81.9 ml 42.51 ml/m  AORTIC VALVE AV Area (Vmax):    2.29 cm AV Area (Vmean):   2.73 cm AV Area (VTI):     2.65 cm AV Vmax:           195.46 cm/s AV Vmean:          124.467 cm/s AV VTI:            0.333 m AV Peak Grad:      15.3 mmHg AV Mean Grad:      7.3 mmHg LVOT Vmax:         142.44 cm/s LVOT Vmean:        107.981 cm/s LVOT VTI:          0.281 m LVOT/AV VTI ratio: 0.84  AORTA Ao Root diam: 3.00 cm MITRAL VALVE MV Area (PHT): 2.07 cm     SHUNTS MV Peak grad:  15.1 mmHg    Systemic VTI:  0.28 m MV Mean grad:  10.0 mmHg    Systemic Diam: 2.00 cm MV Vmax:       1.94 m/s MV Vmean:      152.0 cm/s MV Decel Time: 367 msec MR Peak grad: 62.4 mmHg MR Vmax:      395.00 cm/s MV E velocity: 197.00 cm/s MV A velocity: 188.00 cm/s MV E/A ratio:  1.05 Carlyle Dolly MD Electronically signed by Carlyle Dolly MD Signature Date/Time: 07/28/2019/2:02:44 PM    Final     Orson Eva, DO  [facility]  If 7PM-7AM, please contact night-coverage [email or web address] Password [email or web address] 07/29/2019, 5:39 PM   LOS: 5 days

## 2019-07-29 NOTE — Progress Notes (Addendum)
Palliative:  HPI: 86 y.o. female  with past medical history of stroke, atrial fibrillation on Eliquis, HFpEF, hypertension, hyperlipidemia, CKD stage IV, diabetes, depression admitted on 07/24/2019 with shortness of breath after Lasix placed on hold due to worsening renal function prior to admission. Continues with shortness of breath even with IV diuresis. Palliative care requested to assist with goals of care conversation.   I met again today with Ms. Baldree. She appears more awake and we had a pleasant conversation. However, she seems to have poor understanding and insight into the severity of her illness.  Difficult to get many details into how she is feeling or her previous baseline during conversation. She tells me that she sleeps a lot at home but doesn't usually get short of breath. She tells me she was short of breath just sitting on side of bed today. She is asking to call her sister, Regino Schultze, and requests to see her and to explain to [place] her situation.   I spoke with Regino Schultze over the phone and explained Ms. Choy's illness with progressing heart failure and renal disease. I expressed concern with declining functional status and that this is expected to worsen over time. Regino Schultze expresses understanding and is appreciative of the update but appropriately tearful. Regino Schultze shares with me that Ms. Moody's granddaughter is her 28 (Ms. Kage had told me that Regino Schultze would be her surrogate decision maker). Regino Schultze shares that she is having her own health issues currently and is HCPOA for a sister who lives in a nursing home so granddaughter, Dillard Cannon, is better fitted anyway.   I called and spoke with granddaughter/HCPOA, Emelea. Emelea is able to provide more insight into Ms. Duchesne's baseline which is frequent naps at home but much more alert than she is currently. She does get short of breath moving inside the home at times. I spent some time explaining to [facility] her heart failure and renal failure and  how this is a difficult balance with her Lasix. I expressed concern with her current baseline and that this may not improve. I also expressed concern that this is expected to worsen into the future. Discussed signs and symptoms and how they relate to her illness.   I also discussed with Emelea who confirms she has HCPOA and if they had any further conversations about Ms. Deshotels's wishes if her health were to decline. Emelea says that she has made attempts at these conversations but Ms. Ruggiero has not been very receptive to these conversations. Emelea believes that she would not want resuscitation if she is declining at end of life but at current time she would want all measures. I do believe that is what Ms. Letendre has tried to express to me yesterday. However, I did tell Emelea that I fear she struggles to truly understand. Emelea would like to follow Ms. Bogus's wishes. I will continue to try and discuss further with Ms. Cahalan again tomorrow.   All questions/concerns addressed. Emotional support provided. Discussed with Dr. Carles Collet.   Exam: More awake and alert today. Pleasant. No distress. Overall ill appearing. Generalized weakness. Breathing regular, unlabored at rest. Abd soft.   Plan: - Ongoing goals of care conversation.  - Recommend outpatient palliative to follow at next venue.  - Granddaughter, Dillard Cannon, is HCPOA.   [place], NP Palliative Medicine Team Pager [phone] (Please see [email or web address] for schedule) Team Phone [phone]    Greater than 50%  of this time was spent counseling and coordinating care related to  the above assessment and plan

## 2019-07-30 ENCOUNTER — Inpatient Hospital Stay (HOSPITAL_COMMUNITY): Payer: Medicare HMO

## 2019-07-30 DIAGNOSIS — Z7189 Other specified counseling: Secondary | ICD-10-CM

## 2019-07-30 LAB — CBC
HCT: 26.8 % — ABNORMAL LOW (ref 36.0–46.0)
Hemoglobin: 7.8 g/dL — ABNORMAL LOW (ref 12.0–15.0)
MCH: 25.4 pg — ABNORMAL LOW (ref 26.0–34.0)
MCHC: 29.1 g/dL — ABNORMAL LOW (ref 30.0–36.0)
MCV: 87.3 fL (ref 80.0–100.0)
Platelets: 437 10*3/uL — ABNORMAL HIGH (ref 150–400)
RBC: 3.07 MIL/uL — ABNORMAL LOW (ref 3.87–5.11)
RDW: 15.4 % (ref 11.5–15.5)
WBC: 8.3 10*3/uL (ref 4.0–10.5)
nRBC: 0 % (ref 0.0–0.2)

## 2019-07-30 LAB — BASIC METABOLIC PANEL
Anion gap: 12 (ref 5–15)
BUN: 46 mg/dL — ABNORMAL HIGH (ref 8–23)
CO2: 29 mmol/L (ref 22–32)
Calcium: 9.1 mg/dL (ref 8.9–10.3)
Chloride: 94 mmol/L — ABNORMAL LOW (ref 98–111)
Creatinine, Ser: 2.42 mg/dL — ABNORMAL HIGH (ref 0.44–1.00)
GFR calc Af Amer: 20 mL/min — ABNORMAL LOW (ref >60)
GFR calc non Af Amer: 18 mL/min — ABNORMAL LOW (ref >60)
Glucose, Bld: 154 mg/dL — ABNORMAL HIGH (ref 70–99)
Potassium: 3.6 mmol/L (ref 3.5–5.1)
Sodium: 135 mmol/L (ref 135–145)

## 2019-07-30 LAB — MAGNESIUM: Magnesium: 1.8 mg/dL (ref 1.7–2.4)

## 2019-07-30 LAB — GLUCOSE, CAPILLARY
Glucose-Capillary: 110 mg/dL — ABNORMAL HIGH (ref 70–99)
Glucose-Capillary: 121 mg/dL — ABNORMAL HIGH (ref 70–99)
Glucose-Capillary: 135 mg/dL — ABNORMAL HIGH (ref 70–99)
Glucose-Capillary: 155 mg/dL — ABNORMAL HIGH (ref 70–99)
Glucose-Capillary: 214 mg/dL — ABNORMAL HIGH (ref 70–99)
Glucose-Capillary: 231 mg/dL — ABNORMAL HIGH (ref 70–99)
Glucose-Capillary: 288 mg/dL — ABNORMAL HIGH (ref 70–99)
Glucose-Capillary: 36 mg/dL — CL (ref 70–99)
Glucose-Capillary: 438 mg/dL — ABNORMAL HIGH (ref 70–99)
Glucose-Capillary: 57 mg/dL — ABNORMAL LOW (ref 70–99)
Glucose-Capillary: 81 mg/dL (ref 70–99)
Glucose-Capillary: <10 mg/dL — CL (ref 70–99)

## 2019-07-30 LAB — BRAIN NATRIURETIC PEPTIDE: B Natriuretic Peptide: 92 pg/mL (ref 0.0–100.0)

## 2019-07-30 MED ORDER — DEXTROSE 50 % IV SOLN
12.5000 g | INTRAVENOUS | Status: AC
  Administered 2019-07-30: 25 g via INTRAVENOUS

## 2019-07-30 MED ORDER — DEXTROSE 50 % IV SOLN
INTRAVENOUS | Status: AC
  Administered 2019-07-30: 50 mL
  Filled 2019-07-30: qty 50

## 2019-07-30 MED ORDER — DILTIAZEM HCL ER COATED BEADS 120 MG PO CP24
120.0000 mg | ORAL_CAPSULE | Freq: Every day | ORAL | Status: DC
  Administered 2019-07-31 – 2019-08-03 (×4): 120 mg via ORAL
  Filled 2019-07-30 (×4): qty 1

## 2019-07-30 MED ORDER — DEXTROSE 50 % IV SOLN
1.0000 | Freq: Once | INTRAVENOUS | Status: AC
  Administered 2019-07-31: 50 mL via INTRAVENOUS
  Filled 2019-07-30: qty 50

## 2019-07-30 NOTE — NC FL2 (Signed)
[place] LEVEL OF CARE SCREENING TOOL     IDENTIFICATION  Patient Name: Amy Wiggins Birthdate: [birth year] Sex: female Admission Date (Current Location): 07/24/2019  [facility] and [place] Number:  [employer] and Address:  [place] [address] [place]      Provider Number: [id number]  Attending Physician Name and Address:  Orson Eva, MD  Relative Name and Phone Number:       Current Level of Care: Hospital Recommended Level of Care: [place] Prior Approval Number:    Date Approved/Denied:   PASRR Number: pending  Discharge Plan: SNF    Current Diagnoses: Patient Active Problem List   Diagnosis Date Noted  . Goals of care, counseling/discussion   . Palliative care by specialist   . Acute on chronic diastolic (congestive) heart failure ([place]) 07/24/2019  . Acute on chronic congestive heart failure ([place])   . Acute diastolic CHF (congestive heart failure) ([place]) 09/29/2018  . Acute respiratory failure with hypoxia ([place]) 09/29/2018  . Atrial fibrillation with RVR ([place])   . Hypotension 09/28/2018  . Chronic a-fib with RVR 09/28/2018  . Cerebellar infarct ([place])   . Stroke ([place]) 02/22/2016  . Ischemic stroke ([place]) 02/22/2016  . HFpEF/Chronic diastolic congestive heart failure ([place]) 02/22/2016  . AKI (acute kidney injury) ([place]) 02/22/2016  . Diabetes mellitus with complication ([place])   . Shortness of breath 01/14/2016  . Wheezing 01/14/2016  . Paroxysmal atrial fibrillation ([place]) 11/14/2015  . Cerebrovascular accident (CVA) due to embolism of right posterior cerebral artery ([place]) 08/08/2015  . Essential hypertension 08/08/2015  . HLD (hyperlipidemia) 08/08/2015  . Type 2 diabetes mellitus with circulatory disorder ([place]) 08/08/2015  . PFO (patent foramen ovale)   . ARF (acute renal failure) ([place]) 06/04/2015  . Cerebral infarction due to unspecified mechanism   . Acute CVA (cerebrovascular  accident) ([place]) 06/02/2015  . Hypertension 06/02/2015  . Hyperlipidemia 06/02/2015  . CKD (chronic kidney disease) stage 4, GFR 15-29 ml/min (HCC) 06/02/2015  . DM type 2 (diabetes mellitus, type 2) ([place]) 06/02/2015  . Leukocytosis 06/02/2015  . Muscle weakness (generalized) 09/16/2012  . Pain in joint, shoulder region 09/16/2012  . Closed displaced fracture of lateral end of clavicle 08/18/2012    Orientation RESPIRATION BLADDER Height & Weight     Self, Situation, Place  O2(2L) Continent Weight: 183 lb 3.2 oz (83.1 kg) Height:  5\' 4"  (162.6 cm)  BEHAVIORAL SYMPTOMS/MOOD NEUROLOGICAL BOWEL NUTRITION STATUS      Continent Diet(heart healthy/carb modified)  AMBULATORY STATUS COMMUNICATION OF NEEDS Skin   Limited Assist Verbally Normal                       Personal Care Assistance Level of Assistance  Bathing, Dressing, Feeding Bathing Assistance: Limited assistance Feeding assistance: Limited assistance Dressing Assistance: Limited assistance     Functional Limitations Info  Sight, Speech, Hearing Sight Info: Adequate Hearing Info: Adequate Speech Info: Adequate    SPECIAL CARE FACTORS FREQUENCY  PT (By licensed PT)     PT Frequency: daily              Contractures      Additional Factors Info  Code Status, Allergies, Psychotropic Code Status Info: Full code Allergies Info: No known allergies Psychotropic Info: Wellbutrin, Celexa, Remeron, Trazodone         Current Medications (07/30/2019):  This is the current hospital active medication list Current Facility-Administered Medications  Medication Dose  Route Frequency Provider Last Rate Last Admin  . 0.9 %  sodium chloride infusion  250 mL Intravenous PRN Oswald Hillock, MD      . acetaminophen (TYLENOL) tablet 650 mg  650 mg Oral Q4H PRN Oswald Hillock, MD      . amiodarone (PACERONE) tablet 200 mg  200 mg Oral Daily Oswald Hillock, MD   200 mg at 07/30/19 0849  . apixaban (ELIQUIS) tablet 2.5 mg  2.5 mg  Oral BID Oswald Hillock, MD   2.5 mg at 07/30/19 0849  . atorvastatin (LIPITOR) tablet 20 mg  20 mg Oral QHS Oswald Hillock, MD   20 mg at 07/29/19 2241  . buPROPion (WELLBUTRIN XL) 24 hr tablet 300 mg  300 mg Oral Daily Oswald Hillock, MD   300 mg at 07/30/19 0848  . citalopram (CELEXA) tablet 20 mg  20 mg Oral Daily Oswald Hillock, MD   20 mg at 07/30/19 0850  . diltiazem (CARDIZEM) tablet 30 mg  30 mg Oral Q6H Tat, Shanon Brow, MD   30 mg at 07/30/19 0641  . furosemide (LASIX) injection 40 mg  40 mg Intravenous BID Orson Eva, MD   40 mg at 07/30/19 0848  . insulin aspart (novoLOG) injection 0-15 Units  0-15 Units Subcutaneous TID WC Wynetta Emery, Clanford L, MD   3 Units at 07/30/19 0852  . insulin aspart (novoLOG) injection 0-5 Units  0-5 Units Subcutaneous QHS Irwin Brakeman L, MD   2 Units at 07/29/19 2242  . insulin aspart (novoLOG) injection 6 Units  6 Units Subcutaneous TID WC Johnson, Clanford L, MD   6 Units at 07/30/19 0851  . insulin glargine (LANTUS) injection 20 Units  20 Units Subcutaneous Daily Wynetta Emery, Clanford L, MD   20 Units at 07/29/19 0959  . ipratropium-albuterol (DUONEB) 0.5-2.5 (3) MG/3ML nebulizer solution 3 mL  3 mL Nebulization Q6H PRN Johnson, Clanford L, MD      . ondansetron (ZOFRAN) injection 4 mg  4 mg Intravenous Q6H PRN Darrick Meigs, Marge Duncans, MD      . pantoprazole (PROTONIX) EC tablet 40 mg  40 mg Oral Daily Oswald Hillock, MD   40 mg at 07/30/19 0849  . sodium chloride flush (NS) 0.9 % injection 3 mL  3 mL Intravenous Q12H Oswald Hillock, MD   3 mL at 07/30/19 0853  . sodium chloride flush (NS) 0.9 % injection 3 mL  3 mL Intravenous PRN Oswald Hillock, MD      . traZODone (DESYREL) tablet 150 mg  150 mg Oral QHS Oswald Hillock, MD   150 mg at 07/29/19 2241     Discharge Medications: Please see discharge summary for a list of discharge medications.  Relevant Imaging Results:  Relevant Lab Results:   Additional Information SSN: [id number]. Granddaughter states pt has had  COVID vaccines.  Salome Arnt, LCSW

## 2019-07-30 NOTE — Progress Notes (Signed)
PROGRESS NOTE  Amy Wiggins DQQ:[id number] DOB: Feb 28, 1931 DOA: 07/24/2019 PCP: Asencion Noble, MD  Brief History: 86 year old female with history ofCKD 4,diabetes mellitus type 2, hypertension, hyperlipidemia, paroxysmal atrial fibrillation, stroke, PFO presenting with 2 to 3-day history of shortness of breath and generalized weakness.Patient says that her Lasix was stopped about 2 weeks ago by cardiologist PA.Patient says that since that time she has experienced worsening shortness of breath.  Assessment/Plan: Acute respiratory failure with hypoxia -Secondary to CHF -Presently stable on 2 L nasal cannula -Wean oxygen for saturation 92% -Personally reviewed chest x-ray--increased interstitial markings  Acuteon chronicdiastolic CHF -19/41/7408 echo EF 55-60%, grade 1 DD, no WMA -Continue IV furosemide--increase to bid -Accurate I's and O's--NEG 5.6L -NEG 5 lbs -07/09/19 afib clinic weight 85.3 kg (188 lb) -8/4/20echo--EF 55-60%, moderated MS -07/30/19--personally reviewed CXR--increased interstitial markings, but improving  Atrial fibrillation with RVR, paroxysmal -CHADSVASc = 7 -continue apixaban -start lower dose cardizem 30 mg po q6>>>cardizem CD 120 mg daily -continue amio -07/09/19 afib clinic weight 85.3 kg (188 lb)  Leukocytosis -Suspect stress demargination -she is afebrile and hemodynamically stable -WBC improved  CKD stage4 -Baseline creatinine2.3-2.5 -Monitor with diuresis -A.m. BMP  Uncontrolled diabetes mellitus type 2 with hyperglycemia -Continue Lantusreduced dose -Continue NovoLog sliding scale -5/30/21hemoglobin A1c 8.4  Depression/anxiety -Continue Celexa  -d/c mirtazapine due to lethargy  Hyperlipidemia -Continue statin      Status is: Inpatient  Remains inpatient appropriate because:IV treatments appropriate due to intensity of illness or inability to take PO. Remains fluid overloaded   Dispo: The  patient is from:Home Anticipated d/c is to:SNF Anticipated d/c date is: 1-2 day Patient currentlyis not medically stable to d/c.  Remains fluid overloaded.  Needs PASSAR         Family Communication:granddaughter (POA) updated 6/4  Consultants:none  Code Status: FULL   DVT Prophylaxis:apixaban    Procedures: As Listed in Progress Note Above  Antibiotics: None     Subjective: Pt still complains of sob with mild exertion.  Denies f/c, cp, sob, n/v/d, abd pain  Objective: Vitals:   07/30/19 0100 07/30/19 0431 07/30/19 0500 07/30/19 1439  BP: (!) 115/58 (!) 111/48  (!) 112/43  Pulse:  84  79  Resp:  20  18  Temp:  98.6 F (37 C)  98.7 F (37.1 C)  TempSrc:  Oral  Oral  SpO2:  96%  96%  Weight:   83.1 kg   Height:        Intake/Output Summary (Last 24 hours) at 07/30/2019 1638 Last data filed at 07/30/2019 0853 Gross per 24 hour  Intake 160 ml  Output 300 ml  Net -140 ml   Weight change: -0.4 kg Exam:   General:  Pt is alert, follows commands appropriately, not in acute distress  HEENT: No icterus, No thrush, No neck mass, [place]/AT  Cardiovascular: RRR, S1/S2, no rubs, no gallops  Respiratory: bibasilar crackles. No wheeze  Abdomen: Soft/+BS, non tender, non distended, no guarding  Extremities: No edema, No lymphangitis, No petechiae, No rashes, no synovitis   Data Reviewed: I have personally reviewed following labs and imaging studies Basic Metabolic Panel: Recent Labs  Lab 07/26/19 0558 07/27/19 0514 07/28/19 0542 07/29/19 0624 07/30/19 0521  NA 135 135 135 135 135  K 3.6 3.4* 4.9 3.8 3.6  CL 98 97* 98 94* 94*  CO2 28 27 30 30 29   GLUCOSE 125* 144* 166* 147* 154*  BUN 41* 47* 46* 47*  46*  CREATININE 2.85* 2.83* 2.64* 2.51* 2.42*  CALCIUM 9.3 9.0 9.4 9.4 9.1  MG 1.8 1.8  --   --  1.8   Liver Function Tests: No results for input(s): AST, ALT, ALKPHOS, BILITOT, PROT,  ALBUMIN in the last 168 hours. No results for input(s): LIPASE, AMYLASE in the last 168 hours. No results for input(s): AMMONIA in the last 168 hours. Coagulation Profile: No results for input(s): INR, PROTIME in the last 168 hours. CBC: Recent Labs  Lab 07/26/19 0558 07/27/19 0514 07/28/19 0542 07/29/19 0624 07/30/19 0521  WBC 9.8 8.9 9.1 7.7 8.3  NEUTROABS 6.4  --   --   --   --   HGB 7.8* 7.7* 7.9* 7.6* 7.8*  HCT 27.0* 26.5* 27.2* 26.6* 26.8*  MCV 89.1 88.9 89.8 88.4 87.3  PLT 378 392 399 409* 437*   Cardiac Enzymes: No results for input(s): CKTOTAL, CKMB, CKMBINDEX, TROPONINI in the last 168 hours. BNP: Invalid input(s): POCBNP CBG: Recent Labs  Lab 07/29/19 1646 07/29/19 2124 07/30/19 0314 07/30/19 0757 07/30/19 1132  GLUCAP 123* 232* 121* 155* 288*   HbA1C: No results for input(s): HGBA1C in the last 72 hours. Urine analysis:    Component Value Date/Time   COLORURINE YELLOW 09/28/2018 1216   APPEARANCEUR HAZY (A) 09/28/2018 1216   LABSPEC 1.007 09/28/2018 1216   PHURINE 5.0 09/28/2018 1216   GLUCOSEU >=500 (A) 09/28/2018 1216   HGBUR NEGATIVE 09/28/2018 1216   BILIRUBINUR NEGATIVE 09/28/2018 1216   KETONESUR NEGATIVE 09/28/2018 1216   PROTEINUR NEGATIVE 09/28/2018 1216   UROBILINOGEN 0.2 12/12/2010 1102   NITRITE NEGATIVE 09/28/2018 1216   LEUKOCYTESUR MODERATE (A) 09/28/2018 1216   Sepsis Labs: @LABRCNTIP (procalcitonin:4,lacticidven:4) ) Recent Results (from the past 240 hour(s))  SARS Coronavirus 2 by RT PCR (hospital order, performed in [place] hospital lab) Nasopharyngeal Nasopharyngeal Swab     Status: None   Collection Time: 07/24/19  8:31 PM   Specimen: Nasopharyngeal Swab  Result Value Ref Range Status   SARS Coronavirus 2 NEGATIVE NEGATIVE Final    Comment: (NOTE) SARS-CoV-2 target nucleic acids are NOT DETECTED. The SARS-CoV-2 RNA is generally detectable in upper and lower respiratory specimens during the acute phase of infection.  The lowest concentration of SARS-CoV-2 viral copies this assay can detect is 250 copies / mL. A negative result does not preclude SARS-CoV-2 infection and should not be used as the sole basis for treatment or other patient management decisions.  A negative result may occur with improper specimen collection / handling, submission of specimen other than nasopharyngeal swab, presence of viral mutation(s) within the areas targeted by this assay, and inadequate number of viral copies (<250 copies / mL). A negative result must be combined with clinical observations, patient history, and epidemiological information. Fact Sheet for Patients:   StrictlyIdeas.no Fact Sheet for Healthcare Providers: BankingDealers.co.za This test is not yet approved or cleared  by the [place] FDA and has been authorized for detection and/or diagnosis of SARS-CoV-2 by FDA under an Emergency Use Authorization (EUA).  This EUA will remain in effect (meaning this test can be used) for the duration of the COVID-19 declaration under Section 564(b)(1) of the Act, 21 U.S.C. section 360bbb-3(b)(1), unless the authorization is terminated or revoked sooner. Performed at [facility], [address], [place], [place] [address]      Scheduled Meds:  amiodarone  200 mg Oral Daily   apixaban  2.5 mg Oral BID   atorvastatin  20 mg Oral QHS  buPROPion  300 mg Oral Daily   citalopram  20 mg Oral Daily   diltiazem  120 mg Oral Daily   furosemide  40 mg Intravenous BID   insulin aspart  0-15 Units Subcutaneous TID WC   insulin aspart  0-5 Units Subcutaneous QHS   insulin aspart  6 Units Subcutaneous TID WC   insulin glargine  20 Units Subcutaneous Daily   pantoprazole  40 mg Oral Daily   sodium chloride flush  3 mL Intravenous Q12H   traZODone  150 mg Oral QHS   Continuous Infusions:  sodium chloride      Procedures/Studies: DG Chest 2 View  Result  Date: 07/24/2019 CLINICAL DATA:  86 year old female with shortness of breath. EXAM: CHEST - 2 VIEW COMPARISON:  Chest radiograph dated 10/02/2018. FINDINGS: There is diffuse vascular and interstitial prominence which may represent edema. Confluent areas of air pace density involving the right lung and left lung base may represent edema or pneumonia. Clinical correlation is recommended. Probable small left pleural effusion. No pneumothorax. Stable mild cardiomegaly. A loop recorder device is noted. Atherosclerotic calcification of the aorta. No acute osseous pathology. Degenerative changes. IMPRESSION: Bilateral pulmonary opacities may represent edema or pneumonia. Clinical correlation is recommended. Electronically Signed   By: Anner Crete M.D.   On: 07/24/2019 20:20   DG CHEST PORT 1 VIEW  Result Date: 07/30/2019 CLINICAL DATA:  Heart failure. EXAM: PORTABLE CHEST 1 VIEW COMPARISON:  Chest x-ray dated July 27, 2019. FINDINGS: Stable cardiomegaly. Diffuse interstitial thickening with more patchy airspace opacities in the right upper lobe are unchanged. No pneumothorax or large pleural effusion. No acute osseous abnormality. Loop recorder again noted. IMPRESSION: 1. Unchanged mild pulmonary edema. Electronically Signed   By: Titus Dubin M.D.   On: 07/30/2019 08:12   DG CHEST PORT 1 VIEW  Result Date: 07/27/2019 CLINICAL DATA:  Patient admitted 07/24/2019 with shortness of breath. EXAM: PORTABLE CHEST 1 VIEW COMPARISON:  CT chest 09/29/2018. Single-view of the chest 07/24/2019 and 07/26/2019. FINDINGS: Loop recorder again noted. Cardiomegaly is also again seen. Trace bilateral pleural effusions. Aeration in the right chest continues to improved. The left lung is clear. Atherosclerosis noted. IMPRESSION: Continued improvement in aeration in the right chest. Trace bilateral pleural effusions. Cardiomegaly. Aortic Atherosclerosis (ICD10-I70.0). Electronically Signed   By: Inge Rise M.D.   On:  07/27/2019 09:21   DG CHEST PORT 1 VIEW  Result Date: 07/26/2019 CLINICAL DATA:  Pulmonary edema EXAM: PORTABLE CHEST 1 VIEW COMPARISON:  Jul 24, 2019 FINDINGS: Again noted is cardiomegaly. There is slight interval improvement in the fluffy airspace opacities and interstitial opacities throughout both lungs, right greater than left. No pleural effusion. No acute osseous abnormality. IMPRESSION: Slight interval improvement in the diffuse pulmonary edema, right greater than left. Electronically Signed   By: Prudencio Pair M.D.   On: 07/26/2019 05:05   ECHOCARDIOGRAM COMPLETE  Result Date: 07/28/2019    ECHOCARDIOGRAM REPORT   Patient Name:   CHERRISE OCCHIPINTI Date of Exam: 07/28/2019 Medical Rec #:  [id number]         Height:       64.0 in Accession #:    [id number]        Weight:       192.9 lb Date of Birth:  12/18/1931        BSA:          1.927 m Patient Age:    73 years  BP:           110/56 mmHg Patient Gender: F                 HR:           93 bpm. Exam Location:  [facility] Procedure: 2D Echo Indications:    CHF-Acute Diastolic 938.10 / F75.10  History:        Patient has prior history of Echocardiogram examinations, most                 recent 09/29/2018. Stroke, Arrythmias:Atrial Fibrillation; Risk                 Factors:Diabetes, Dyslipidemia, Hypertension and Non-Smoker. PFO                 (patent foramen ovale.  Sonographer:    Leavy Cella RDCS (AE) Referring Phys: [phone] Akshar Starnes IMPRESSIONS  1. Left ventricular ejection fraction, by estimation, is 60 to 65%. The left ventricle has normal function. The left ventricle has no regional wall motion abnormalities. There is moderate left ventricular hypertrophy. Left ventricular diastolic parameters are consistent with Grade II diastolic dysfunction (pseudonormalization). Elevated left atrial pressure.  2. Right ventricular systolic function is normal. The right ventricular size is normal.  3. Left atrial size was severely dilated.  4. The MV  itself is poorly visualized, unable to corroborate elevated gradient morphologically. Consider TEE if clinically indicated. . The mitral valve was not well visualized. Trivial mitral valve regurgitation. Moderate to severe mitral stenosis by mean gradient. Mean gradient 10 mmHg, HR 85  5. The aortic valve is tricuspid. Aortic valve regurgitation is not visualized. No aortic stenosis is present. FINDINGS  Left Ventricle: Left ventricular ejection fraction, by estimation, is 60 to 65%. The left ventricle has normal function. The left ventricle has no regional wall motion abnormalities. The left ventricular internal cavity size was normal in size. There is  moderate left ventricular hypertrophy. Left ventricular diastolic parameters are consistent with Grade II diastolic dysfunction (pseudonormalization). Elevated left atrial pressure. Right Ventricle: The right ventricular size is normal. No increase in right ventricular wall thickness. Right ventricular systolic function is normal. Left Atrium: Left atrial size was severely dilated. Right Atrium: Right atrial size was not well visualized. Pericardium: There is no evidence of pericardial effusion. Mitral Valve: The MV itself is poorly visualized, unable to corroborate elevated gradient morphologically. Consider TEE if clinically indicated. The mitral valve was not well visualized. There is moderate thickening of the mitral valve leaflet(s). There is moderate calcification of the mitral valve leaflet(s). Moderate mitral annular calcification. Trivial mitral valve regurgitation. Moderate to severe mitral stenosis by mean gradient. Mean gradient 10 mmHg, HR 85 mitral valve stenosis. MV peak gradient, 15.1 mmHg. The mean mitral valve gradient is 10.0 mmHg. Tricuspid Valve: The tricuspid valve is not well visualized. Tricuspid valve regurgitation is not demonstrated. No evidence of tricuspid stenosis. Aortic Valve: The aortic valve is tricuspid. . There is moderate  thickening and moderate calcification of the aortic valve. Aortic valve regurgitation is not visualized. No aortic stenosis is present. Moderate aortic valve annular calcification. There is  moderate thickening of the aortic valve. There is moderate calcification of the aortic valve. Aortic valve mean gradient measures 7.3 mmHg. Aortic valve peak gradient measures 15.3 mmHg. Aortic valve area, by VTI measures 2.65 cm. Pulmonic Valve: The pulmonic valve was not well visualized. Pulmonic valve regurgitation is not visualized. No evidence of pulmonic stenosis. Aorta: The aortic  root is normal in size and structure. Pulmonary Artery: Indeterminant PASP, inadequate TR jet. Venous: The inferior vena cava was not well visualized. IAS/Shunts: The interatrial septum was not well visualized.  LEFT VENTRICLE PLAX 2D LVIDd:         4.48 cm  Diastology LVIDs:         2.81 cm  LV e' lateral:   6.31 cm/s LV PW:         1.30 cm  LV E/e' lateral: 31.2 LV IVS:        1.25 cm  LV e' medial:    5.11 cm/s LVOT diam:     2.00 cm  LV E/e' medial:  38.6 LV SV:         88 LV SV Index:   46 LVOT Area:     3.14 cm  RIGHT VENTRICLE RV S prime:     14.00 cm/s TAPSE (M-mode): 1.9 cm LEFT ATRIUM             Index       RIGHT ATRIUM           Index LA diam:        4.90 cm 2.54 cm/m  RA Area:     15.20 cm LA Vol (A2C):   59.7 ml 30.99 ml/m RA Volume:   37.50 ml  19.46 ml/m LA Vol (A4C):   93.1 ml 48.32 ml/m LA Biplane Vol: 81.9 ml 42.51 ml/m  AORTIC VALVE AV Area (Vmax):    2.29 cm AV Area (Vmean):   2.73 cm AV Area (VTI):     2.65 cm AV Vmax:           195.46 cm/s AV Vmean:          124.467 cm/s AV VTI:            0.333 m AV Peak Grad:      15.3 mmHg AV Mean Grad:      7.3 mmHg LVOT Vmax:         142.44 cm/s LVOT Vmean:        107.981 cm/s LVOT VTI:          0.281 m LVOT/AV VTI ratio: 0.84  AORTA Ao Root diam: 3.00 cm MITRAL VALVE MV Area (PHT): 2.07 cm     SHUNTS MV Peak grad:  15.1 mmHg    Systemic VTI:  0.28 m MV Mean grad:  10.0  mmHg    Systemic Diam: 2.00 cm MV Vmax:       1.94 m/s MV Vmean:      152.0 cm/s MV Decel Time: 367 msec MR Peak grad: 62.4 mmHg MR Vmax:      395.00 cm/s MV E velocity: 197.00 cm/s MV A velocity: 188.00 cm/s MV E/A ratio:  1.05 Carlyle Dolly MD Electronically signed by Carlyle Dolly MD Signature Date/Time: 07/28/2019/2:02:44 PM    Final     Orson Eva, DO  [facility]  If 7PM-7AM, please contact night-coverage [email or web address] Password [email or web address] 07/30/2019, 4:38 PM   LOS: 6 days

## 2019-07-30 NOTE — Progress Notes (Signed)
Re: Amy Wiggins  DOB: [birth year] Date: 07/30/2019  Must ID: [id number]  To Whom it May Concern:  Please be advised that the above named patient will require a short-term nursing home stay- anticipated 30 days or less rehabilitation and strengthening. The plan is for return home.

## 2019-07-30 NOTE — Progress Notes (Signed)
Physical Therapy Treatment Patient Details Name: Amy Wiggins MRN: [id number] DOB: 1931-11-11 Today's Date: 07/30/2019    History of Present Illness Amy Wiggins  is a 86 y.o. female, with history of paroxysmal atrial fibrillation on chronic anticoagulation with Eliquis, HFpEF, stroke, hypertension, hyperlipidemia, diabetes mellitus type 2, depression, CKD stage IV came to ED with complaints of shortness of breath.  Patient says that her Lasix was stopped about 2 weeks ago by cardiologist PA.  Patient says that since that time she has experienced worsening shortness of breath.  She denies fever or chills.  Denies coughing up any phlegm.  Denies chest pain.  Denies nausea vomiting or diarrhea.  Denies abdominal pain or dysuria.    PT Comments    Patient requires min assist to complete bed mobility today with HOB elevated and use of bed rails. She states dizziness upon sitting upright at EOB. Her O2 saturation on 1L supplemental O2 was 89%, which decreased to 86% with activity on 1L. She requires min assist with use of RW to transfer to/from [facility]. She is limited to ambulating in room secondary to fatigue and stating she feels she will pass out. Patient no longer feels safe to return home after how she felt today during session since she is typically home alone during the day with family working. Patient limited today by weakness, fatigue, and impaired balance. Updated recommendation below for patient to d/c to SNF secondary to fatigue and weakness and limited activity tolerance. Patient will benefit from continued physical therapy in [place] and recommended venue below to increase strength, balance, endurance for safe ADLs and gait.   Follow Up Recommendations  Supervision for mobility/OOB;SNF;Supervision/Assistance - 24 hour     Equipment Recommendations  None recommended by PT    Recommendations for Other Services       Precautions / Restrictions Precautions Precautions:  Fall Restrictions Weight Bearing Restrictions: No    Mobility  Bed Mobility Overal bed mobility: Needs Assistance Bed Mobility: Supine to Sit     Supine to sit: Min assist;HOB elevated Sit to supine: Min assist   General bed mobility comments: min assist to fully upright trunk with HOB elevated and bedrail use  Transfers Overall transfer level: Needs assistance Equipment used: Rolling walker (2 wheeled) Transfers: Sit to/from [facility] Sit to Stand: Min assist Stand pivot transfers: Min assist       General transfer comment: able to rise from seated surfaces with BUE assisting to rise, bed<>BSC transfer to void bladder with good hand placement with RW  Ambulation/Gait Ambulation/Gait assistance: Min guard Gait Distance (Feet): 15 Feet Assistive device: Rolling walker (2 wheeled) Gait Pattern/deviations: Step-through pattern;Decreased stride length Gait velocity: decreased   General Gait Details: slow labored steps on 1 L O2, O2 sat at 89% on 1L at rest, decrease to 86% on 1L with ambulation with increased work of breathing, patient stated she felt she may pass out and she is returned to Cytogeneticist    Modified Rankin (Stroke Patients Only)       Balance Overall balance assessment: Needs assistance Sitting-balance support: Feet supported Sitting balance-Leahy Scale: Good Sitting balance - Comments: seated EOB   Standing balance support: Bilateral upper extremity supported Standing balance-Leahy Scale: Fair Standing balance comment: fair using RW  Cognition Arousal/Alertness: Awake/alert Behavior During Therapy: WFL for tasks assessed/performed;Flat affect Overall Cognitive Status: Within Functional Limits for tasks assessed                                        Exercises      General Comments        Pertinent Vitals/Pain Pain  Assessment: No/denies pain    Home Living                      Prior Function            PT Goals (current goals can now be found in the care plan section) Acute Rehab PT Goals Patient Stated Goal: Return home PT Goal Formulation: With patient/family Time For Goal Achievement: 08/09/19 Potential to Achieve Goals: Good Progress towards PT goals: Progressing toward goals    Frequency    Min 3X/week      PT Plan Discharge plan needs to be updated    Co-evaluation              AM-PAC PT "6 Clicks" Mobility   Outcome Measure  Help needed turning from your back to your side while in a flat bed without using bedrails?: None Help needed moving from lying on your back to sitting on the side of a flat bed without using bedrails?: A Little Help needed moving to and from a bed to a chair (including a wheelchair)?: A Lot Help needed standing up from a chair using your arms (e.g., wheelchair or bedside chair)?: A Little Help needed to walk in [facility] room?: A Lot Help needed climbing 3-5 steps with a railing? : A Lot 6 Click Score: 16    End of Session Equipment Utilized During Treatment: Gait belt;Oxygen Activity Tolerance: Patient tolerated treatment well Patient left: in chair;with call bell/phone within reach;with chair alarm set Nurse Communication: Mobility status PT Visit Diagnosis: Unsteadiness on feet (R26.81);Other abnormalities of gait and mobility (R26.89);Muscle weakness (generalized) (M62.81)     Time: 3220-2542 PT Time Calculation (min) (ACUTE ONLY): 25 min  Charges:  $Therapeutic Activity: 23-37 mins                     12:40 PM, 07/30/19 Amy [facility]ins PT, DPT Physical Therapist at [facility]

## 2019-07-30 NOTE — Care Management Important Message (Signed)
Important Message  Patient Details  Name: Amy Wiggins MRN: [id number] Date of Birth: 1931/05/05   Medicare Important Message Given:  Yes     [name] 07/30/2019, 12:57 PM

## 2019-07-30 NOTE — Progress Notes (Signed)
Palliative:  HPI: 86 y.o.femalewith past medical history of stroke, atrial fibrillation on Eliquis, HFpEF, hypertension, hyperlipidemia, CKD stage IV, diabetes, depressionadmitted on 5/29/2021with shortness of breath after Lasix placed on hold due to worsening renal function prior to admission.Continues with shortness of breath even with IV diuresis. Palliative care requested to assist with goals of care conversation.   I met today with Amy Wiggins and her ex daughter in law, Amy [facility]ins, at bedside. Amy Wiggins is on her way out. I spoke further with Amy W[place]ins. We discussed the possibility of short term rehab as I am concerned with her lethargy, shortness of breath with any activity, and generalized weakness. She does not have with her 24/7 at home because her granddaughter does work. Amy Wiggins would prefer to go home but will consider rehab if this will be a safer option for her temporarily. We also further discussed code status and she tells me that she wants everything done to keep her alive BUT when I explain that this could mean that she is just lying in bed and quality of life would be poor she tells me that this is a different story and not what she would want. Of note she is oriented to self and place but not to time (cannot tell me the month or year) and not to situation.   I spoke with granddaughter, Amy W[place]ins, via telephone. I explained the above conversation. I explained that Amy Wiggins is unable to understand the consequences to give [place] a direct answer on her wishes. However, I do feel that when I explain the consequences of the resuscitation she is clearly making her wishes know and I would be supportive of DNR as this would be consistent with what she is telling me but to continue all interventions/treatments to prevent from getting to that point. I asked Amy Wiggins if she agrees with DNR and she does agree but would like to speak with Amy Wiggins herself about this when she comes to visit this evening. She  also agrees with rehab stay and will began to put in place caregivers to help care for Amy Wiggins at home when she leaves rehab.   All questions/concerns addressed. Emotional support provided. Discussed with Dr. Carles Collet.   Exam: Alert, oriented x 2 only. No distress. Breathing regular, unlabored at rest. Abd flat. Generalized weakness.   Plan:  - Granddaughter/HCPOA, Amy Wiggins, will let us know final decision for DNR.  - Plan for SNF rehab trial and recommend outpatient palliative to follow there.   [place], NP Palliative Medicine Team Pager [phone] (Please see [email or web address] for schedule) Team Phone [phone]    Greater than 50%  of this time was spent counseling and coordinating care related to the above assessment and plan

## 2019-07-30 NOTE — Plan of Care (Signed)
  Problem: Clinical Measurements: Goal: Will remain free from infection Outcome: Progressing   Problem: Pain Managment: Goal: General experience of comfort will improve Outcome: Progressing   Problem: Safety: Goal: Ability to remain free from injury will improve Outcome: Progressing   Problem: Skin Integrity: Goal: Risk for impaired skin integrity will decrease Outcome: Progressing   Problem: Cardiac: Goal: Ability to achieve and maintain adequate cardiopulmonary perfusion will improve Outcome: Progressing

## 2019-07-30 NOTE — TOC Progression Note (Addendum)
Transition of Care [facility]) - Progression Note    Patient Details  Name: Amy Wiggins MRN: [id number] Date of Birth: 12/26/1931  [facility]) CM/SW Contact  Salome Arnt, [place] Phone Number: 07/30/2019, 3:24 PM  Clinical Narrative: PT now recommending SNF. LCSW discussed with pt who agreed to LCSW following up with pt's granddaughter, [name]. [name] agrees to sending out referral to [facility]. She is aware of need for insurance authorization. LCSW sent out clinicals and will provide bed offers when available. LCSW called [employer] and SNF will have to start authorization.  [place] pending due to depression.      Expected Discharge Plan: [place] Barriers to Discharge: Continued Medical Work up  Expected Discharge Plan and Services Expected Discharge Plan: [place] Choice: [place] arrangements for the past 2 months: Single Family Home                                       Social Determinants of Health (SDOH) Interventions    Readmission Risk Interventions No flowsheet data found.

## 2019-07-31 ENCOUNTER — Inpatient Hospital Stay (HOSPITAL_COMMUNITY): Payer: Medicare HMO

## 2019-07-31 LAB — CBC
HCT: 25.3 % — ABNORMAL LOW (ref 36.0–46.0)
Hemoglobin: 7.3 g/dL — ABNORMAL LOW (ref 12.0–15.0)
MCH: 25.1 pg — ABNORMAL LOW (ref 26.0–34.0)
MCHC: 28.9 g/dL — ABNORMAL LOW (ref 30.0–36.0)
MCV: 86.9 fL (ref 80.0–100.0)
Platelets: 418 10*3/uL — ABNORMAL HIGH (ref 150–400)
RBC: 2.91 MIL/uL — ABNORMAL LOW (ref 3.87–5.11)
RDW: 15 % (ref 11.5–15.5)
WBC: 8.3 10*3/uL (ref 4.0–10.5)
nRBC: 0 % (ref 0.0–0.2)

## 2019-07-31 LAB — GLUCOSE, CAPILLARY
Glucose-Capillary: 111 mg/dL — ABNORMAL HIGH (ref 70–99)
Glucose-Capillary: 117 mg/dL — ABNORMAL HIGH (ref 70–99)
Glucose-Capillary: 130 mg/dL — ABNORMAL HIGH (ref 70–99)
Glucose-Capillary: 145 mg/dL — ABNORMAL HIGH (ref 70–99)
Glucose-Capillary: 167 mg/dL — ABNORMAL HIGH (ref 70–99)
Glucose-Capillary: 174 mg/dL — ABNORMAL HIGH (ref 70–99)
Glucose-Capillary: 181 mg/dL — ABNORMAL HIGH (ref 70–99)
Glucose-Capillary: 24 mg/dL — CL (ref 70–99)
Glucose-Capillary: 280 mg/dL — ABNORMAL HIGH (ref 70–99)
Glucose-Capillary: 36 mg/dL — CL (ref 70–99)
Glucose-Capillary: 400 mg/dL — ABNORMAL HIGH (ref 70–99)
Glucose-Capillary: 413 mg/dL — ABNORMAL HIGH (ref 70–99)
Glucose-Capillary: 50 mg/dL — ABNORMAL LOW (ref 70–99)
Glucose-Capillary: 54 mg/dL — ABNORMAL LOW (ref 70–99)
Glucose-Capillary: 59 mg/dL — ABNORMAL LOW (ref 70–99)
Glucose-Capillary: 78 mg/dL (ref 70–99)
Glucose-Capillary: 86 mg/dL (ref 70–99)

## 2019-07-31 LAB — BASIC METABOLIC PANEL
Anion gap: 11 (ref 5–15)
BUN: 51 mg/dL — ABNORMAL HIGH (ref 8–23)
CO2: 31 mmol/L (ref 22–32)
Calcium: 9 mg/dL (ref 8.9–10.3)
Chloride: 93 mmol/L — ABNORMAL LOW (ref 98–111)
Creatinine, Ser: 2.55 mg/dL — ABNORMAL HIGH (ref 0.44–1.00)
GFR calc Af Amer: 19 mL/min — ABNORMAL LOW (ref >60)
GFR calc non Af Amer: 17 mL/min — ABNORMAL LOW (ref >60)
Glucose, Bld: 199 mg/dL — ABNORMAL HIGH (ref 70–99)
Potassium: 2.7 mmol/L — CL (ref 3.5–5.1)
Sodium: 135 mmol/L (ref 135–145)

## 2019-07-31 LAB — OCCULT BLOOD X 1 CARD TO LAB, STOOL: Fecal Occult Bld: NEGATIVE

## 2019-07-31 LAB — MRSA PCR SCREENING: MRSA by PCR: POSITIVE — AB

## 2019-07-31 LAB — IRON AND TIBC
Iron: 106 ug/dL (ref 28–170)
Saturation Ratios: 32 % — ABNORMAL HIGH (ref 10.4–31.8)
TIBC: 331 ug/dL (ref 250–450)
UIBC: 225 ug/dL

## 2019-07-31 LAB — T4, FREE: Free T4: 0.97 ng/dL (ref 0.61–1.12)

## 2019-07-31 LAB — TSH: TSH: 3.105 u[IU]/mL (ref 0.350–4.500)

## 2019-07-31 LAB — GLUCOSE, RANDOM: Glucose, Bld: 395 mg/dL — ABNORMAL HIGH (ref 70–99)

## 2019-07-31 LAB — FOLATE: Folate: 8 ng/mL (ref >5.9)

## 2019-07-31 LAB — MAGNESIUM: Magnesium: 1.7 mg/dL (ref 1.7–2.4)

## 2019-07-31 LAB — FERRITIN: Ferritin: 36 ng/mL (ref 11–307)

## 2019-07-31 LAB — VITAMIN B12: Vitamin B-12: 577 pg/mL (ref 180–914)

## 2019-07-31 MED ORDER — DEXTROSE 50 % IV SOLN: 25.0000 g | Freq: Once | INTRAVENOUS | Status: AC

## 2019-07-31 MED ORDER — DEXTROSE 50 % IV SOLN
50.0000 mL | Freq: Once | INTRAVENOUS | Status: AC
  Administered 2019-07-31: 50 mL via INTRAVENOUS

## 2019-07-31 MED ORDER — SODIUM CHLORIDE 0.9 % IV SOLN
125.0000 mg | Freq: Once | INTRAVENOUS | Status: AC
  Administered 2019-07-31: 125 mg via INTRAVENOUS
  Filled 2019-07-31: qty 10

## 2019-07-31 MED ORDER — DEXTROSE 50 % IV SOLN
INTRAVENOUS | Status: AC
  Administered 2019-07-31: 50 mL
  Filled 2019-07-31: qty 50

## 2019-07-31 MED ORDER — POTASSIUM CHLORIDE 10 MEQ/100ML IV SOLN
10.0000 meq | INTRAVENOUS | Status: DC
  Administered 2019-07-31: 10 meq via INTRAVENOUS
  Filled 2019-07-31: qty 100

## 2019-07-31 MED ORDER — DEXTROSE 50 % IV SOLN
INTRAVENOUS | Status: AC
  Filled 2019-07-31: qty 50

## 2019-07-31 MED ORDER — DEXTROSE 10 % IV SOLN: INTRAVENOUS | Status: DC

## 2019-07-31 MED ORDER — DEXTROSE 50 % IV SOLN
25.0000 g | INTRAVENOUS | Status: AC
  Administered 2019-07-31: 25 g via INTRAVENOUS

## 2019-07-31 MED ORDER — DEXTROSE 50 % IV SOLN
12.5000 g | INTRAVENOUS | Status: AC
  Administered 2019-07-31: 12.5 g via INTRAVENOUS

## 2019-07-31 MED ORDER — DEXTROSE 5 % IV SOLN: INTRAVENOUS | Status: DC

## 2019-07-31 MED ORDER — FOLIC ACID 1 MG PO TABS
1.0000 mg | ORAL_TABLET | Freq: Every day | ORAL | Status: DC
  Administered 2019-07-31 – 2019-08-03 (×4): 1 mg via ORAL
  Filled 2019-07-31 (×4): qty 1

## 2019-07-31 MED ORDER — MUPIROCIN 2 % EX OINT
1.0000 "application " | TOPICAL_OINTMENT | Freq: Two times a day (BID) | CUTANEOUS | Status: DC
  Administered 2019-07-31 – 2019-08-03 (×7): 1 via NASAL
  Filled 2019-07-31 (×2): qty 22

## 2019-07-31 MED ORDER — CHLORHEXIDINE GLUCONATE CLOTH 2 % EX PADS
6.0000 | MEDICATED_PAD | Freq: Every day | CUTANEOUS | Status: DC
  Administered 2019-07-31 – 2019-08-03 (×4): 6 via TOPICAL

## 2019-07-31 MED ORDER — MAGNESIUM SULFATE 2 GM/50ML IV SOLN
2.0000 g | Freq: Once | INTRAVENOUS | Status: AC
  Administered 2019-07-31: 2 g via INTRAVENOUS
  Filled 2019-07-31: qty 50

## 2019-07-31 MED ORDER — POTASSIUM CHLORIDE CRYS ER 20 MEQ PO TBCR
40.0000 meq | EXTENDED_RELEASE_TABLET | Freq: Four times a day (QID) | ORAL | Status: AC
  Administered 2019-07-31 (×2): 40 meq via ORAL
  Filled 2019-07-31 (×2): qty 2

## 2019-07-31 NOTE — Progress Notes (Signed)
   07/30/19 2332  Provider Notification  Provider Name/Title Dr Humphrey Rolls   Date Provider Notified 07/30/19  Time Provider Notified 2333  Notification Type Call  Notification Reason Change in status (Abrupt BG changes and drops, )  Response Other (Comment) (to recheck BG in 15 mins and call him)  Date of Provider Response 07/30/19  Time of Provider Response 2333

## 2019-07-31 NOTE — Progress Notes (Signed)
Patient blood glucose level at 400, notified on-call. Order received and implemented.

## 2019-07-31 NOTE — Progress Notes (Signed)
CRITICAL VALUE ALERT  Critical Value:  Potassium 2.7  Date & Time Notied:  07/31/19, 0650  Provider Notified: Humphrey Rolls  Orders Received/Actions taken: Awaiting orders

## 2019-07-31 NOTE — Progress Notes (Signed)
   07/30/19 2258  Provider Notification  Provider Name/Title Dr Humphrey Rolls  Date Provider Notified 07/30/19  Time Provider Notified 2258  Notification Type Page  Notification Reason Change in status (lethargic, BG-36)  Response Other (Comment) (1 amp of d50, recheck BG )  Date of Provider Response 07/30/19  Time of Provider Response 2258

## 2019-07-31 NOTE — Progress Notes (Addendum)
Hypoglycemic Event  CBG: 10 Treatment: D50 50 mL (25 gm)  Symptoms: Sweaty, nonresponsive, clammy  Follow-up CBG: Time: 2139 CBG Result: 231  Possible Reasons for Event: Unknown  Comments/MD notified: Rapid Response initiated, MD and response team on floor to assess patient. Wheezing/crackles noted bilaterally. New order placed for CXR. Nebulizer treatment given.     Amy W[place]ins

## 2019-07-31 NOTE — Progress Notes (Signed)
Patient's rectal temperature upon arrival to unit 93.6, MD aware, bair hugger placed. Patient's CBG continuing to drop with D10 continuous infusion, currently 78. MD aware. Family at bedside.

## 2019-07-31 NOTE — Progress Notes (Signed)
Blood glucose currently 413. Notified Dr. Humphrey Rolls, order to continue to monitor and notify if greater than 500. Order placed for glucose draw per protocol.

## 2019-07-31 NOTE — Progress Notes (Signed)
Patient's PASSR number is [id number] E.  Madilyn Fireman, MSW, LCSW-A Transitions of Care  Clinical Social Worker  [facility]  Medical ICU [phone]

## 2019-07-31 NOTE — Progress Notes (Signed)
PROGRESS NOTE  KESHONNA VALVO VZD:[id number] DOB: Aug 03, 1931 DOA: 07/24/2019 PCP: Asencion Noble, MD  Brief History: 86 year old female with history ofCKD 4,diabetes mellitus type 2, hypertension, hyperlipidemia, paroxysmal atrial fibrillation, stroke, PFO presenting with 2 to 3-day history of shortness of breath and generalized weakness.Patient says that her Lasix was stopped about 2 weeks ago by cardiologist PA.Patient says that since that time she has experienced worsening shortness of breath.  The patient was noted to be fluid overloaded and started on IV lasix with slow clinical improvement.  On the evening of 07/30/19, patient became unresponsive due to hypoglycemia and she was placed on a D10 drip.  Her insulin was adjusted and D10 drip weaned.  Assessment/Plan: Acute respiratory failure with hypoxia -Secondary to CHF -Presently stable on 2 L nasal cannula -Wean oxygen for saturation 92% -Personally reviewed chest x-ray--increased interstitial markings -CT chest  Acuteon chronicdiastolic CHF -29/51/8841 echo EF 55-60%, grade 1 DD, no WMA -Continue IV furosemide--increase to bid -Accurate I's and O's--NEG 5.6L -NEG 5 lbs -07/09/19 afib clinic weight 85.3 kg (188 lb) -8/4/20echo--EF 55-60%, moderated MS -07/30/19--personally reviewed CXR--increased interstitial markings, but improving  Atrial fibrillation with RVR, paroxysmal -CHADSVASc = 7 -continue apixaban -start lower dose cardizem 30 mg po q6>>>cardizem CD 120 mg daily -continue amio -07/09/19 afib clinic weight 85.3 kg (188 lb) -TSH 3.105  Leukocytosis -stress demargination -she is afebrile and hemodynamically stable -WBC improved  CKD stage4 -Baseline creatinine2.3-2.5 -Monitor with diuresis -A.m. BMP  Uncontrolled diabetes mellitus type 2 with hyperglycemia -6/5 holding lantus and ISS temporarily due to hypoglycemia  -5/30/21hemoglobin A1c 8.4 -am  cortisol  Depression/anxiety -Continue Celexa -d/c mirtazapine due to lethargy  Hyperlipidemia -Continue statin      Status is: Inpatient  Remains inpatient appropriate because:IV treatments appropriate due to intensity of illness or inability to take PO. Remains fluid overloaded   Dispo: The patient is from:Home Anticipated d/c is to:SNF Anticipated d/c date is: 1-2day Patient currentlyis not medically stable to d/c.Remains fluid overloaded.  Needs PASSAR      Subjective: Patient denies fevers, chills, headache, chest pain, dyspnea, nausea, vomiting, diarrhea, abdominal pain, dysuria, hematuria, hematochezia, and melena.   Objective: Vitals:   07/31/19 0900 07/31/19 1000 07/31/19 1100 07/31/19 1114  BP: (!) 114/51 (!) 102/43 (!) 101/52   Pulse: 72 76 75 75  Resp: 14 15 17 14   Temp:    97.8 F (36.6 C)  TempSrc:    Oral  SpO2: 100% 97% 92% 96%  Weight:      Height:        Intake/Output Summary (Last 24 hours) at 07/31/2019 1249 Last data filed at 07/31/2019 0837 Gross per 24 hour  Intake 873.65 ml  Output --  Net 873.65 ml   Weight change: 3.9 kg Exam:   General:  Pt is alert, follows commands appropriately, not in acute distress  HEENT: No icterus, No thrush, No neck mass, [place]/AT  Cardiovascular: RRR, S1/S2, no rubs, no gallops  Respiratory: bilateral crackles. No wheeze  Abdomen: Soft/+BS, non tender, non distended, no guarding  Extremities: No edema, No lymphangitis, No petechiae, No rashes, no synovitis   Data Reviewed: I have personally reviewed following labs and imaging studies Basic Metabolic Panel: Recent Labs  Lab 07/26/19 0558 07/26/19 0558 07/27/19 0514 07/28/19 0542 07/29/19 0624 07/30/19 0521 07/31/19 0405  NA 135   < > 135 135 135 135 135  K 3.6   < > 3.4* 4.9 3.8 3.6  2.7*  CL 98   < > 97* 98 94* 94* 93*  CO2 28   < > 27 30 30 29 31   GLUCOSE 125*   < > 144* 166* 147*  154* 199*  BUN 41*   < > 47* 46* 47* 46* 51*  CREATININE 2.85*   < > 2.83* 2.64* 2.51* 2.42* 2.55*  CALCIUM 9.3   < > 9.0 9.4 9.4 9.1 9.0  MG 1.8  --  1.8  --   --  1.8 1.7   < > = values in this interval not displayed.   Liver Function Tests: No results for input(s): AST, ALT, ALKPHOS, BILITOT, PROT, ALBUMIN in the last 168 hours. No results for input(s): LIPASE, AMYLASE in the last 168 hours. No results for input(s): AMMONIA in the last 168 hours. Coagulation Profile: No results for input(s): INR, PROTIME in the last 168 hours. CBC: Recent Labs  Lab 07/26/19 0558 07/26/19 0558 07/27/19 0514 07/28/19 0542 07/29/19 0624 07/30/19 0521 07/31/19 0405  WBC 9.8   < > 8.9 9.1 7.7 8.3 8.3  NEUTROABS 6.4  --   --   --   --   --   --   HGB 7.8*   < > 7.7* 7.9* 7.6* 7.8* 7.3*  HCT 27.0*   < > 26.5* 27.2* 26.6* 26.8* 25.3*  MCV 89.1   < > 88.9 89.8 88.4 87.3 86.9  PLT 378   < > 392 399 409* 437* 418*   < > = values in this interval not displayed.   Cardiac Enzymes: No results for input(s): CKTOTAL, CKMB, CKMBINDEX, TROPONINI in the last 168 hours. BNP: Invalid input(s): POCBNP CBG: Recent Labs  Lab 07/31/19 0604 07/31/19 0649 07/31/19 0722 07/31/19 0818 07/31/19 1112  GLUCAP 59* 145* 130* 117* 181*   HbA1C: No results for input(s): HGBA1C in the last 72 hours. Urine analysis:    Component Value Date/Time   COLORURINE YELLOW 09/28/2018 1216   APPEARANCEUR HAZY (A) 09/28/2018 1216   LABSPEC 1.007 09/28/2018 1216   PHURINE 5.0 09/28/2018 1216   GLUCOSEU >=500 (A) 09/28/2018 1216   HGBUR NEGATIVE 09/28/2018 1216   BILIRUBINUR NEGATIVE 09/28/2018 1216   KETONESUR NEGATIVE 09/28/2018 1216   PROTEINUR NEGATIVE 09/28/2018 1216   UROBILINOGEN 0.2 12/12/2010 1102   NITRITE NEGATIVE 09/28/2018 1216   LEUKOCYTESUR MODERATE (A) 09/28/2018 1216   Sepsis Labs: @LABRCNTIP (procalcitonin:4,lacticidven:4) ) Recent Results (from the past 240 hour(s))  SARS Coronavirus 2 by RT PCR  (hospital order, performed in [facility] hospital lab) Nasopharyngeal Nasopharyngeal Swab     Status: None   Collection Time: 07/24/19  8:31 PM   Specimen: Nasopharyngeal Swab  Result Value Ref Range Status   SARS Coronavirus 2 NEGATIVE NEGATIVE Final    Comment: (NOTE) SARS-CoV-2 target nucleic acids are NOT DETECTED. The SARS-CoV-2 RNA is generally detectable in upper and lower respiratory specimens during the acute phase of infection. The lowest concentration of SARS-CoV-2 viral copies this assay can detect is 250 copies / mL. A negative result does not preclude SARS-CoV-2 infection and should not be used as the sole basis for treatment or other patient management decisions.  A negative result may occur with improper specimen collection / handling, submission of specimen other than nasopharyngeal swab, presence of viral mutation(s) within the areas targeted by this assay, and inadequate number of viral copies (<250 copies / mL). A negative result must be combined with clinical observations, patient history, and epidemiological information. Fact Sheet for Patients:   StrictlyIdeas.no  Fact Sheet for Healthcare Providers: BankingDealers.co.za This test is not yet approved or cleared  by the [place] FDA and has been authorized for detection and/or diagnosis of SARS-CoV-2 by FDA under an Emergency Use Authorization (EUA).  This EUA will remain in effect (meaning this test can be used) for the duration of the COVID-19 declaration under Section 564(b)(1) of the Act, 21 U.S.C. section 360bbb-3(b)(1), unless the authorization is terminated or revoked sooner. Performed at [facility], [address] [place], [place] [address]   MRSA PCR Screening     Status: Abnormal   Collection Time: 07/31/19  2:31 AM   Specimen: Nasal Mucosa; Nasopharyngeal  Result Value Ref Range Status   MRSA by PCR POSITIVE (A) NEGATIVE Final    Comment:         The GeneXpert MRSA Assay (FDA approved for NASAL specimens only), is one component of a comprehensive MRSA colonization surveillance program. It is not intended to diagnose MRSA infection nor to guide or monitor treatment for MRSA infections. RESULT CALLED TO, READ BACK BY AND VERIFIED WITH: MURPHY @ 0909 ON [id number] BY HENDERSON L. Performed at [facility], [address] [place], [place] [address]      Scheduled Meds: . amiodarone  200 mg Oral Daily  . apixaban  2.5 mg Oral BID  . atorvastatin  20 mg Oral QHS  . buPROPion  300 mg Oral Daily  . Chlorhexidine Gluconate Cloth  6 each Topical Daily  . citalopram  20 mg Oral Daily  . diltiazem  120 mg Oral Daily  . folic acid  1 mg Oral Daily  . furosemide  40 mg Intravenous BID  . mupirocin ointment  1 application Nasal BID  . pantoprazole  40 mg Oral Daily  . potassium chloride  40 mEq Oral Q6H  . sodium chloride flush  3 mL Intravenous Q12H  . traZODone  150 mg Oral QHS   Continuous Infusions: . sodium chloride    . dextrose 75 mL/hr at 07/31/19 0828  . magnesium sulfate bolus IVPB      Procedures/Studies: DG Chest 2 View  Result Date: 07/24/2019 CLINICAL DATA:  86 year old female with shortness of breath. EXAM: CHEST - 2 VIEW COMPARISON:  Chest radiograph dated 10/02/2018. FINDINGS: There is diffuse vascular and interstitial prominence which may represent edema. Confluent areas of air pace density involving the right lung and left lung base may represent edema or pneumonia. Clinical correlation is recommended. Probable small left pleural effusion. No pneumothorax. Stable mild cardiomegaly. A loop recorder device is noted. Atherosclerotic calcification of the aorta. No acute osseous pathology. Degenerative changes. IMPRESSION: Bilateral pulmonary opacities may represent edema or pneumonia. Clinical correlation is recommended. Electronically Signed   By: Anner Crete M.D.   On: 07/24/2019 20:20   DG CHEST PORT 1  VIEW  Result Date: 07/30/2019 CLINICAL DATA:  Shortness of breath.  Rapid response. EXAM: PORTABLE CHEST 1 VIEW COMPARISON:  Radiograph earlier this day. FINDINGS: Unchanged heart size and mediastinal contours. Aortic atherosclerosis. Loop recorder projects over the left chest wall. Generalized pulmonary edema with slight improvement, however there is worsening and right suprahilar patchy opacity. No pneumothorax or large pleural effusion. Bones are diffusely under mineralized without acute osseous abnormality. IMPRESSION: Slight improvement in background pulmonary edema from earlier this day, however there is worsening right suprahilar patchy opacity, may be developing asymmetric pulmonary edema or pneumonia. Given relative rapid progression, aspiration is considered. Recommend correlation for aspiration risk factors. Electronically Signed   By: Threasa Beards  Sanford M.D.   On: 07/30/2019 22:11   DG CHEST PORT 1 VIEW  Result Date: 07/30/2019 CLINICAL DATA:  Heart failure. EXAM: PORTABLE CHEST 1 VIEW COMPARISON:  Chest x-ray dated July 27, 2019. FINDINGS: Stable cardiomegaly. Diffuse interstitial thickening with more patchy airspace opacities in the right upper lobe are unchanged. No pneumothorax or large pleural effusion. No acute osseous abnormality. Loop recorder again noted. IMPRESSION: 1. Unchanged mild pulmonary edema. Electronically Signed   By: Titus Dubin M.D.   On: 07/30/2019 08:12   DG CHEST PORT 1 VIEW  Result Date: 07/27/2019 CLINICAL DATA:  Patient admitted 07/24/2019 with shortness of breath. EXAM: PORTABLE CHEST 1 VIEW COMPARISON:  CT chest 09/29/2018. Single-view of the chest 07/24/2019 and 07/26/2019. FINDINGS: Loop recorder again noted. Cardiomegaly is also again seen. Trace bilateral pleural effusions. Aeration in the right chest continues to improved. The left lung is clear. Atherosclerosis noted. IMPRESSION: Continued improvement in aeration in the right chest. Trace bilateral pleural  effusions. Cardiomegaly. Aortic Atherosclerosis (ICD10-I70.0). Electronically Signed   By: Inge Rise M.D.   On: 07/27/2019 09:21   DG CHEST PORT 1 VIEW  Result Date: 07/26/2019 CLINICAL DATA:  Pulmonary edema EXAM: PORTABLE CHEST 1 VIEW COMPARISON:  Jul 24, 2019 FINDINGS: Again noted is cardiomegaly. There is slight interval improvement in the fluffy airspace opacities and interstitial opacities throughout both lungs, right greater than left. No pleural effusion. No acute osseous abnormality. IMPRESSION: Slight interval improvement in the diffuse pulmonary edema, right greater than left. Electronically Signed   By: Prudencio Pair M.D.   On: 07/26/2019 05:05   ECHOCARDIOGRAM COMPLETE  Result Date: 07/28/2019    ECHOCARDIOGRAM REPORT   Patient Name:   MILIANA GANGWER Date of Exam: 07/28/2019 Medical Rec #:  [id number]         Height:       64.0 in Accession #:    [id number]        Weight:       192.9 lb Date of Birth:  1932-01-15        BSA:          1.927 m Patient Age:    74 years          BP:           110/56 mmHg Patient Gender: F                 HR:           93 bpm. Exam Location:  [facility] Procedure: 2D Echo Indications:    CHF-Acute Diastolic 885.02 / D74.12  History:        Patient has prior history of Echocardiogram examinations, most                 recent 09/29/2018. Stroke, Arrythmias:Atrial Fibrillation; Risk                 Factors:Diabetes, Dyslipidemia, Hypertension and Non-Smoker. PFO                 (patent foramen ovale.  Sonographer:    Leavy Cella RDCS (AE) Referring Phys: [phone] Cayley Pester IMPRESSIONS  1. Left ventricular ejection fraction, by estimation, is 60 to 65%. The left ventricle has normal function. The left ventricle has no regional wall motion abnormalities. There is moderate left ventricular hypertrophy. Left ventricular diastolic parameters are consistent with Grade II diastolic dysfunction (pseudonormalization). Elevated left atrial pressure.  2. Right ventricular  systolic function is normal. The right ventricular  size is normal.  3. Left atrial size was severely dilated.  4. The MV itself is poorly visualized, unable to corroborate elevated gradient morphologically. Consider TEE if clinically indicated. . The mitral valve was not well visualized. Trivial mitral valve regurgitation. Moderate to severe mitral stenosis by mean gradient. Mean gradient 10 mmHg, HR 85  5. The aortic valve is tricuspid. Aortic valve regurgitation is not visualized. No aortic stenosis is present. FINDINGS  Left Ventricle: Left ventricular ejection fraction, by estimation, is 60 to 65%. The left ventricle has normal function. The left ventricle has no regional wall motion abnormalities. The left ventricular internal cavity size was normal in size. There is  moderate left ventricular hypertrophy. Left ventricular diastolic parameters are consistent with Grade II diastolic dysfunction (pseudonormalization). Elevated left atrial pressure. Right Ventricle: The right ventricular size is normal. No increase in right ventricular wall thickness. Right ventricular systolic function is normal. Left Atrium: Left atrial size was severely dilated. Right Atrium: Right atrial size was not well visualized. Pericardium: There is no evidence of pericardial effusion. Mitral Valve: The MV itself is poorly visualized, unable to corroborate elevated gradient morphologically. Consider TEE if clinically indicated. The mitral valve was not well visualized. There is moderate thickening of the mitral valve leaflet(s). There is moderate calcification of the mitral valve leaflet(s). Moderate mitral annular calcification. Trivial mitral valve regurgitation. Moderate to severe mitral stenosis by mean gradient. Mean gradient 10 mmHg, HR 85 mitral valve stenosis. MV peak gradient, 15.1 mmHg. The mean mitral valve gradient is 10.0 mmHg. Tricuspid Valve: The tricuspid valve is not well visualized. Tricuspid valve regurgitation is not  demonstrated. No evidence of tricuspid stenosis. Aortic Valve: The aortic valve is tricuspid. . There is moderate thickening and moderate calcification of the aortic valve. Aortic valve regurgitation is not visualized. No aortic stenosis is present. Moderate aortic valve annular calcification. There is  moderate thickening of the aortic valve. There is moderate calcification of the aortic valve. Aortic valve mean gradient measures 7.3 mmHg. Aortic valve peak gradient measures 15.3 mmHg. Aortic valve area, by VTI measures 2.65 cm. Pulmonic Valve: The pulmonic valve was not well visualized. Pulmonic valve regurgitation is not visualized. No evidence of pulmonic stenosis. Aorta: The aortic root is normal in size and structure. Pulmonary Artery: Indeterminant PASP, inadequate TR jet. Venous: The inferior vena cava was not well visualized. IAS/Shunts: The interatrial septum was not well visualized.  LEFT VENTRICLE PLAX 2D LVIDd:         4.48 cm  Diastology LVIDs:         2.81 cm  LV e' lateral:   6.31 cm/s LV PW:         1.30 cm  LV E/e' lateral: 31.2 LV IVS:        1.25 cm  LV e' medial:    5.11 cm/s LVOT diam:     2.00 cm  LV E/e' medial:  38.6 LV SV:         88 LV SV Index:   46 LVOT Area:     3.14 cm  RIGHT VENTRICLE RV S prime:     14.00 cm/s TAPSE (M-mode): 1.9 cm LEFT ATRIUM             Index       RIGHT ATRIUM           Index LA diam:        4.90 cm 2.54 cm/m  RA Area:     15.20 cm LA Vol (  A2C):   59.7 ml 30.99 ml/m RA Volume:   37.50 ml  19.46 ml/m LA Vol (A4C):   93.1 ml 48.32 ml/m LA Biplane Vol: 81.9 ml 42.51 ml/m  AORTIC VALVE AV Area (Vmax):    2.29 cm AV Area (Vmean):   2.73 cm AV Area (VTI):     2.65 cm AV Vmax:           195.46 cm/s AV Vmean:          124.467 cm/s AV VTI:            0.333 m AV Peak Grad:      15.3 mmHg AV Mean Grad:      7.3 mmHg LVOT Vmax:         142.44 cm/s LVOT Vmean:        107.981 cm/s LVOT VTI:          0.281 m LVOT/AV VTI ratio: 0.84  AORTA Ao Root diam: 3.00 cm MITRAL  VALVE MV Area (PHT): 2.07 cm     SHUNTS MV Peak grad:  15.1 mmHg    Systemic VTI:  0.28 m MV Mean grad:  10.0 mmHg    Systemic Diam: 2.00 cm MV Vmax:       1.94 m/s MV Vmean:      152.0 cm/s MV Decel Time: 367 msec MR Peak grad: 62.4 mmHg MR Vmax:      395.00 cm/s MV E velocity: 197.00 cm/s MV A velocity: 188.00 cm/s MV E/A ratio:  1.05 Carlyle Dolly MD Electronically signed by Carlyle Dolly MD Signature Date/Time: 07/28/2019/2:02:44 PM    Final     Orson Eva, DO  [facility]  If 7PM-7AM, please contact night-coverage [email or web address] Password [email or web address] 07/31/2019, 12:49 PM   LOS: 7 days

## 2019-07-31 NOTE — Progress Notes (Signed)
MD aware of patient's requirement of two dextrose amps over two hours.

## 2019-07-31 NOTE — Progress Notes (Signed)
Patient aroused to pain. D5 @ 75 ml/hr infusing. CBG 50. MD notified. New orders to give one amp of D50 and change fluids to D10 @ 75 ml per hour. Orders placed and followed. Patient to be transferred to [facility] Stepdown unit, report given to [facility], RN. Family at bedside, visitation approved by nursing supervision.

## 2019-07-31 NOTE — Progress Notes (Signed)
Notified family

## 2019-08-01 LAB — GLUCOSE, CAPILLARY
Glucose-Capillary: 181 mg/dL — ABNORMAL HIGH (ref 70–99)
Glucose-Capillary: 207 mg/dL — ABNORMAL HIGH (ref 70–99)
Glucose-Capillary: 279 mg/dL — ABNORMAL HIGH (ref 70–99)
Glucose-Capillary: 319 mg/dL — ABNORMAL HIGH (ref 70–99)
Glucose-Capillary: 320 mg/dL — ABNORMAL HIGH (ref 70–99)
Glucose-Capillary: 351 mg/dL — ABNORMAL HIGH (ref 70–99)
Glucose-Capillary: 369 mg/dL — ABNORMAL HIGH (ref 70–99)

## 2019-08-01 LAB — BASIC METABOLIC PANEL
Anion gap: 7 (ref 5–15)
BUN: 46 mg/dL — ABNORMAL HIGH (ref 8–23)
CO2: 32 mmol/L (ref 22–32)
Calcium: 9 mg/dL (ref 8.9–10.3)
Chloride: 94 mmol/L — ABNORMAL LOW (ref 98–111)
Creatinine, Ser: 2.73 mg/dL — ABNORMAL HIGH (ref 0.44–1.00)
GFR calc Af Amer: 18 mL/min — ABNORMAL LOW (ref >60)
GFR calc non Af Amer: 15 mL/min — ABNORMAL LOW (ref >60)
Glucose, Bld: 277 mg/dL — ABNORMAL HIGH (ref 70–99)
Potassium: 4.8 mmol/L (ref 3.5–5.1)
Sodium: 133 mmol/L — ABNORMAL LOW (ref 135–145)

## 2019-08-01 LAB — CBC
HCT: 25.1 % — ABNORMAL LOW (ref 36.0–46.0)
Hemoglobin: 7.2 g/dL — ABNORMAL LOW (ref 12.0–15.0)
MCH: 24.8 pg — ABNORMAL LOW (ref 26.0–34.0)
MCHC: 28.7 g/dL — ABNORMAL LOW (ref 30.0–36.0)
MCV: 86.6 fL (ref 80.0–100.0)
Platelets: 455 10*3/uL — ABNORMAL HIGH (ref 150–400)
RBC: 2.9 MIL/uL — ABNORMAL LOW (ref 3.87–5.11)
RDW: 15.2 % (ref 11.5–15.5)
WBC: 9.2 10*3/uL (ref 4.0–10.5)
nRBC: 0 % (ref 0.0–0.2)

## 2019-08-01 LAB — MAGNESIUM: Magnesium: 2.5 mg/dL — ABNORMAL HIGH (ref 1.7–2.4)

## 2019-08-01 LAB — CORTISOL-AM, BLOOD: Cortisol - AM: 11.9 ug/dL (ref 6.7–22.6)

## 2019-08-01 LAB — PROCALCITONIN: Procalcitonin: 0.7 ng/mL

## 2019-08-01 MED ORDER — INSULIN ASPART 100 UNIT/ML ~~LOC~~ SOLN
0.0000 [IU] | Freq: Three times a day (TID) | SUBCUTANEOUS | Status: DC
  Administered 2019-08-01: 7 [IU] via SUBCUTANEOUS
  Administered 2019-08-02: 9 [IU] via SUBCUTANEOUS
  Administered 2019-08-02: 5 [IU] via SUBCUTANEOUS

## 2019-08-01 MED ORDER — FUROSEMIDE 40 MG PO TABS
40.0000 mg | ORAL_TABLET | Freq: Every day | ORAL | Status: DC
  Administered 2019-08-02 – 2019-08-03 (×2): 40 mg via ORAL
  Filled 2019-08-01 (×2): qty 1

## 2019-08-01 MED ORDER — MELATONIN 3 MG PO TABS
9.0000 mg | ORAL_TABLET | Freq: Once | ORAL | Status: AC
  Administered 2019-08-01: 9 mg via ORAL
  Filled 2019-08-01: qty 3

## 2019-08-01 MED ORDER — SODIUM CHLORIDE 0.9 % IV SOLN
2.0000 g | INTRAVENOUS | Status: DC
  Administered 2019-08-01 – 2019-08-02 (×2): 2 g via INTRAVENOUS
  Filled 2019-08-01 (×2): qty 2

## 2019-08-01 MED ORDER — INSULIN ASPART 100 UNIT/ML ~~LOC~~ SOLN
0.0000 [IU] | Freq: Every day | SUBCUTANEOUS | Status: DC
  Administered 2019-08-01: 5 [IU] via SUBCUTANEOUS

## 2019-08-01 NOTE — Progress Notes (Signed)
Pharmacy Antibiotic Note  Amy Wiggins is a 86 y.o. female admitted on 07/24/2019 with pneumonia.  Pharmacy has been consulted for cefepime dosing.  Plan: cefepime 2gm iv q24h   Height: 5\' 4"  (162.6 cm) Weight: 87.7 kg (193 lb 5.5 oz) IBW/kg (Calculated) : 54.7  Temp (24hrs), Avg:98.5 F (36.9 C), Min:98 F (36.7 C), Max:99 F (37.2 C)  Recent Labs  Lab 07/28/19 0542 07/29/19 0624 07/30/19 0521 07/31/19 0405 08/01/19 0259  WBC 9.1 7.7 8.3 8.3 9.2  CREATININE 2.64* 2.51* 2.42* 2.55* 2.73*    Estimated Creatinine Clearance: 16.1 mL/min (A) (by C-G formula based on SCr of 2.73 mg/dL (H)).    No Known Allergies  Antimicrobials this admission: 6/6 cefepime >>   Microbiology results: 6/5 MRSA PCR: positive 6/5 Covid 19: negative   Thank you for allowing pharmacy to be a part of this patient's care.  Donna Christen Quetzally Callas 08/01/2019 1:59 PM

## 2019-08-01 NOTE — Progress Notes (Signed)
PROGRESS NOTE  Amy Wiggins ZGY:[id number] DOB: [birth year] DOA: 07/24/2019 PCP: Asencion Noble, MD    Brief History: 86 year old female with history ofCKD 4,diabetes mellitus type 2, hypertension, hyperlipidemia, paroxysmal atrial fibrillation, stroke, PFO presenting with 2 to 3-day history of shortness of breath and generalized weakness.Patient says that her Lasix was stopped about 2 weeks ago by cardiologist PA.Patient says that since that time she has experienced worsening shortness of breath.  The patient was noted to be fluid overloaded and started on IV lasix with slow clinical improvement.  On the evening of 07/30/19, patient became unresponsive due to hypoglycemia and she was placed on a D10 drip.  Her insulin was adjusted and D10 drip weaned.  Assessment/Plan: Acute respiratory failure with hypoxia -Secondary to CHF -Presently stable on 2 L nasal cannula -Wean oxygen for saturation 92% -Personally reviewed chest x-ray--increased interstitial markings -CT chest--extensive irregular bilateral GGO, most conspicuous RUL; underlying bronchiectasis.  Small bilateral pleural effusion with atelectasis vs consolidation -start cefepime -check PCT  Acuteon chronicdiastolic CHF -59/16/3846 echo EF 55-60%, grade 1 DD, no WMA -08/01/19--transition to po lasix -Accurate I's and O's--NEG 5.6L -NEG 5 lbs -07/09/19 afib clinic weight 85.3 kg (188 lb) -8/4/20echo--EF 55-60%, moderated MS -07/30/19--personally reviewed CXR--increased interstitial markings, but improving  Atrial fibrillation with RVR, paroxysmal -CHADSVASc = 7 -continue apixaban -start lower dose cardizem 30 mg po q6>>>cardizem CD 120 mg daily -continue amio -07/09/19 afib clinic weight 85.3 kg (188 lb) -TSH 3.105  Hypoglycemia -improved -d/c D10W -restart ISS  CKD stage4 -Baseline creatinine2.3-2.5 -Monitor with diuresis -A.m. BMP  Uncontrolled diabetes mellitus type 2 with  hyperglycemia -6/5 holding lantus and ISS temporarily due to hypoglycemia  -5/30/21hemoglobin A1c 8.4 -am cortisol--11.9  Depression/anxiety -Continue Celexa -d/c mirtazapine due to lethargy-->more alert  Hyperlipidemia -Continue statin      Status is: Inpatient  Remains inpatient appropriate because:IV treatments appropriate due to intensity of illness or inability to take PO. Remains fluid overloaded   Dispo: The patient is from:Home Anticipated d/c is to:SNF Anticipated d/c date is: 1-2day Patient currentlyis not medically stable to d/c.Remains fluid overloaded. Needs PASSAR  Family Communication:NoFamily at bedside  Consultants:none  Code Status: FULL   DVT Prophylaxis:apixaban    Procedures: As Listed in Progress Note Above  Antibiotics: Cefepime 6/6>>>    Subjective: Patient denies fevers, chills, headache, chest pain, dyspnea, nausea, vomiting, diarrhea, abdominal pain, dysuria, hematuria, hematochezia, and melena.   Objective: Vitals:   08/01/19 0900 08/01/19 1100 08/01/19 1135 08/01/19 1200  BP: (!) 128/57 (!) 98/37  (!) 113/54  Pulse: 84 71  77  Resp: 17 17  17   Temp:   98.5 F (36.9 C)   TempSrc:   Oral   SpO2: 95% 94%  94%  Weight:      Height:        Intake/Output Summary (Last 24 hours) at 08/01/2019 1338 Last data filed at 08/01/2019 1300 Gross per 24 hour  Intake 944.51 ml  Output 1600 ml  Net -655.49 ml   Weight change: 0.7 kg Exam:   General:  Pt is alert, follows commands appropriately, not in acute distress  HEENT: No icterus, No thrush, No neck mass, [place]/AT  Cardiovascular: RRR, S1/S2, no rubs, no gallops  Respiratory: bibasilar rales. No wheeze  Abdomen: Soft/+BS, non tender, non distended, no guarding  Extremities: No edema, No lymphangitis, No petechiae, No rashes, no synovitis   Data Reviewed: I have personally reviewed following labs  and  imaging studies Basic Metabolic Panel: Recent Labs  Lab 07/26/19 0558 07/26/19 0558 07/27/19 0514 07/27/19 0514 07/28/19 0542 07/28/19 0542 07/29/19 9935 07/30/19 0521 07/31/19 0405 07/31/19 2305 08/01/19 0259  NA 135   < > 135   < > 135  --  135 135 135  --  133*  K 3.6   < > 3.4*   < > 4.9  --  3.8 3.6 2.7*  --  4.8  CL 98   < > 97*   < > 98  --  94* 94* 93*  --  94*  CO2 28   < > 27   < > 30  --  30 29 31   --  32  GLUCOSE 125*   < > 144*   < > 166*   < > 147* 154* 199* 395* 277*  BUN 41*   < > 47*   < > 46*  --  47* 46* 51*  --  46*  CREATININE 2.85*   < > 2.83*   < > 2.64*  --  2.51* 2.42* 2.55*  --  2.73*  CALCIUM 9.3   < > 9.0   < > 9.4  --  9.4 9.1 9.0  --  9.0  MG 1.8  --  1.8  --   --   --   --  1.8 1.7  --  2.5*   < > = values in this interval not displayed.   Liver Function Tests: No results for input(s): AST, ALT, ALKPHOS, BILITOT, PROT, ALBUMIN in the last 168 hours. No results for input(s): LIPASE, AMYLASE in the last 168 hours. No results for input(s): AMMONIA in the last 168 hours. Coagulation Profile: No results for input(s): INR, PROTIME in the last 168 hours. CBC: Recent Labs  Lab 07/26/19 0558 07/27/19 0514 07/28/19 0542 07/29/19 0624 07/30/19 0521 07/31/19 0405 08/01/19 0259  WBC 9.8   < > 9.1 7.7 8.3 8.3 9.2  NEUTROABS 6.4  --   --   --   --   --   --   HGB 7.8*   < > 7.9* 7.6* 7.8* 7.3* 7.2*  HCT 27.0*   < > 27.2* 26.6* 26.8* 25.3* 25.1*  MCV 89.1   < > 89.8 88.4 87.3 86.9 86.6  PLT 378   < > 399 409* 437* 418* 455*   < > = values in this interval not displayed.   Cardiac Enzymes: No results for input(s): CKTOTAL, CKMB, CKMBINDEX, TROPONINI in the last 168 hours. BNP: Invalid input(s): POCBNP CBG: Recent Labs  Lab 08/01/19 0033 08/01/19 0247 08/01/19 0524 08/01/19 0729 08/01/19 1106  GLUCAP 320* 279* 207* 181* 369*   HbA1C: No results for input(s): HGBA1C in the last 72 hours. Urine analysis:    Component Value Date/Time    COLORURINE YELLOW 09/28/2018 1216   APPEARANCEUR HAZY (A) 09/28/2018 1216   LABSPEC 1.007 09/28/2018 1216   PHURINE 5.0 09/28/2018 1216   GLUCOSEU >=500 (A) 09/28/2018 1216   HGBUR NEGATIVE 09/28/2018 1216   BILIRUBINUR NEGATIVE 09/28/2018 1216   KETONESUR NEGATIVE 09/28/2018 1216   PROTEINUR NEGATIVE 09/28/2018 1216   UROBILINOGEN 0.2 12/12/2010 1102   NITRITE NEGATIVE 09/28/2018 1216   LEUKOCYTESUR MODERATE (A) 09/28/2018 1216   Sepsis Labs: @LABRCNTIP (procalcitonin:4,lacticidven:4) ) Recent Results (from the past 240 hour(s))  SARS Coronavirus 2 by RT PCR (hospital order, performed in [place] hospital lab) Nasopharyngeal Nasopharyngeal Swab     Status: None   Collection Time: 07/24/19  8:31 PM  Specimen: Nasopharyngeal Swab  Result Value Ref Range Status   SARS Coronavirus 2 NEGATIVE NEGATIVE Final    Comment: (NOTE) SARS-CoV-2 target nucleic acids are NOT DETECTED. The SARS-CoV-2 RNA is generally detectable in upper and lower respiratory specimens during the acute phase of infection. The lowest concentration of SARS-CoV-2 viral copies this assay can detect is 250 copies / mL. A negative result does not preclude SARS-CoV-2 infection and should not be used as the sole basis for treatment or other patient management decisions.  A negative result may occur with improper specimen collection / handling, submission of specimen other than nasopharyngeal swab, presence of viral mutation(s) within the areas targeted by this assay, and inadequate number of viral copies (<250 copies / mL). A negative result must be combined with clinical observations, patient history, and epidemiological information. Fact Sheet for Patients:   StrictlyIdeas.no Fact Sheet for Healthcare Providers: BankingDealers.co.za This test is not yet approved or cleared  by the [place] FDA and has been authorized for detection and/or diagnosis of  SARS-CoV-2 by FDA under an Emergency Use Authorization (EUA).  This EUA will remain in effect (meaning this test can be used) for the duration of the COVID-19 declaration under Section 564(b)(1) of the Act, 21 U.S.C. section 360bbb-3(b)(1), unless the authorization is terminated or revoked sooner. Performed at [facility], [address] [place], [place] [address]   MRSA PCR Screening     Status: Abnormal   Collection Time: 07/31/19  2:31 AM   Specimen: Nasal Mucosa; Nasopharyngeal  Result Value Ref Range Status   MRSA by PCR POSITIVE (A) NEGATIVE Final    Comment:        The GeneXpert MRSA Assay (FDA approved for NASAL specimens only), is one component of a comprehensive MRSA colonization surveillance program. It is not intended to diagnose MRSA infection nor to guide or monitor treatment for MRSA infections. RESULT CALLED TO, READ BACK BY AND VERIFIED WITH: MURPHY @ 0909 ON [id number] BY HENDERSON L. Performed at [facility], [address], [place], [place] [address]      Scheduled Meds: . amiodarone  200 mg Oral Daily  . apixaban  2.5 mg Oral BID  . atorvastatin  20 mg Oral QHS  . buPROPion  300 mg Oral Daily  . Chlorhexidine Gluconate Cloth  6 each Topical Daily  . citalopram  20 mg Oral Daily  . diltiazem  120 mg Oral Daily  . folic acid  1 mg Oral Daily  . furosemide  40 mg Intravenous BID  . mupirocin ointment  1 application Nasal BID  . pantoprazole  40 mg Oral Daily  . sodium chloride flush  3 mL Intravenous Q12H  . traZODone  150 mg Oral QHS   Continuous Infusions: . sodium chloride    . dextrose Stopped (07/31/19 2137)    Procedures/Studies: DG Chest 2 View  Result Date: 07/24/2019 CLINICAL DATA:  86 year old female with shortness of breath. EXAM: CHEST - 2 VIEW COMPARISON:  Chest radiograph dated 10/02/2018. FINDINGS: There is diffuse vascular and interstitial prominence which may represent edema. Confluent areas of air pace density involving the right  lung and left lung base may represent edema or pneumonia. Clinical correlation is recommended. Probable small left pleural effusion. No pneumothorax. Stable mild cardiomegaly. A loop recorder device is noted. Atherosclerotic calcification of the aorta. No acute osseous pathology. Degenerative changes. IMPRESSION: Bilateral pulmonary opacities may represent edema or pneumonia. Clinical correlation is recommended. Electronically Signed   By: Laren Everts.D.  On: 07/24/2019 20:20   CT CHEST WO CONTRAST  Result Date: 07/31/2019 CLINICAL DATA:  Chronic dyspnea EXAM: CT CHEST WITHOUT CONTRAST TECHNIQUE: Multidetector CT imaging of the chest was performed following the standard protocol without IV contrast. COMPARISON:  CT chest, 09/29/2018 FINDINGS: Cardiovascular: Aortic atherosclerosis. Normal heart size. Three-vessel coronary artery calcifications. No pericardial effusion. Mediastinum/Nodes: No enlarged mediastinal, hilar, or axillary lymph nodes. Thyroid gland, trachea, and esophagus demonstrate no significant findings. Lungs/Pleura: Small bilateral pleural effusions and associated atelectasis or consolidation. There is extensive irregular bilateral ground-glass airspace opacity, most conspicuous in the right upper lobe (series 4, image 46). There is mild tubular bronchiectasis throughout. Upper Abdomen: No acute abnormality. Musculoskeletal: No chest wall mass or suspicious bone lesions identified. IMPRESSION: 1. There is extensive irregular bilateral ground-glass airspace opacity, most conspicuous in the right upper lobe. These findings are new compared to prior CT dated 09/29/2018 and most consistent with multifocal infection. 2. There is mild underlying tubular bronchiectasis throughout. 3. Small bilateral pleural effusions and associated atelectasis or consolidation. 4. Coronary artery disease.  Aortic Atherosclerosis (ICD10-I70.0). Electronically Signed   By: Eddie Candle M.D.   On: 07/31/2019 15:32    DG CHEST PORT 1 VIEW  Result Date: 07/30/2019 CLINICAL DATA:  Shortness of breath.  Rapid response. EXAM: PORTABLE CHEST 1 VIEW COMPARISON:  Radiograph earlier this day. FINDINGS: Unchanged heart size and mediastinal contours. Aortic atherosclerosis. Loop recorder projects over the left chest wall. Generalized pulmonary edema with slight improvement, however there is worsening and right suprahilar patchy opacity. No pneumothorax or large pleural effusion. Bones are diffusely under mineralized without acute osseous abnormality. IMPRESSION: Slight improvement in background pulmonary edema from earlier this day, however there is worsening right suprahilar patchy opacity, may be developing asymmetric pulmonary edema or pneumonia. Given relative rapid progression, aspiration is considered. Recommend correlation for aspiration risk factors. Electronically Signed   By: Keith Rake M.D.   On: 07/30/2019 22:11   DG CHEST PORT 1 VIEW  Result Date: 07/30/2019 CLINICAL DATA:  Heart failure. EXAM: PORTABLE CHEST 1 VIEW COMPARISON:  Chest x-ray dated July 27, 2019. FINDINGS: Stable cardiomegaly. Diffuse interstitial thickening with more patchy airspace opacities in the right upper lobe are unchanged. No pneumothorax or large pleural effusion. No acute osseous abnormality. Loop recorder again noted. IMPRESSION: 1. Unchanged mild pulmonary edema. Electronically Signed   By: Titus Dubin M.D.   On: 07/30/2019 08:12   DG CHEST PORT 1 VIEW  Result Date: 07/27/2019 CLINICAL DATA:  Patient admitted 07/24/2019 with shortness of breath. EXAM: PORTABLE CHEST 1 VIEW COMPARISON:  CT chest 09/29/2018. Single-view of the chest 07/24/2019 and 07/26/2019. FINDINGS: Loop recorder again noted. Cardiomegaly is also again seen. Trace bilateral pleural effusions. Aeration in the right chest continues to improved. The left lung is clear. Atherosclerosis noted. IMPRESSION: Continued improvement in aeration in the right chest. Trace  bilateral pleural effusions. Cardiomegaly. Aortic Atherosclerosis (ICD10-I70.0). Electronically Signed   By: Inge Rise M.D.   On: 07/27/2019 09:21   DG CHEST PORT 1 VIEW  Result Date: 07/26/2019 CLINICAL DATA:  Pulmonary edema EXAM: PORTABLE CHEST 1 VIEW COMPARISON:  Jul 24, 2019 FINDINGS: Again noted is cardiomegaly. There is slight interval improvement in the fluffy airspace opacities and interstitial opacities throughout both lungs, right greater than left. No pleural effusion. No acute osseous abnormality. IMPRESSION: Slight interval improvement in the diffuse pulmonary edema, right greater than left. Electronically Signed   By: Prudencio Pair M.D.   On: 07/26/2019 05:05   ECHOCARDIOGRAM  COMPLETE  Result Date: 07/28/2019    ECHOCARDIOGRAM REPORT   Patient Name:   WALBURGA HUDMAN Date of Exam: 07/28/2019 Medical Rec #:  [id number]         Height:       64.0 in Accession #:    [id number]        Weight:       192.9 lb Date of Birth:  09-27-1931        BSA:          1.927 m Patient Age:    73 years          BP:           110/56 mmHg Patient Gender: F                 HR:           93 bpm. Exam Location:  [facility] Procedure: 2D Echo Indications:    CHF-Acute Diastolic 616.07 / P71.06  History:        Patient has prior history of Echocardiogram examinations, most                 recent 09/29/2018. Stroke, Arrythmias:Atrial Fibrillation; Risk                 Factors:Diabetes, Dyslipidemia, Hypertension and Non-Smoker. PFO                 (patent foramen ovale.  Sonographer:    Leavy Cella RDCS (AE) Referring Phys: [phone] Nashika Coker IMPRESSIONS  1. Left ventricular ejection fraction, by estimation, is 60 to 65%. The left ventricle has normal function. The left ventricle has no regional wall motion abnormalities. There is moderate left ventricular hypertrophy. Left ventricular diastolic parameters are consistent with Grade II diastolic dysfunction (pseudonormalization). Elevated left atrial pressure.  2.  Right ventricular systolic function is normal. The right ventricular size is normal.  3. Left atrial size was severely dilated.  4. The MV itself is poorly visualized, unable to corroborate elevated gradient morphologically. Consider TEE if clinically indicated. . The mitral valve was not well visualized. Trivial mitral valve regurgitation. Moderate to severe mitral stenosis by mean gradient. Mean gradient 10 mmHg, HR 85  5. The aortic valve is tricuspid. Aortic valve regurgitation is not visualized. No aortic stenosis is present. FINDINGS  Left Ventricle: Left ventricular ejection fraction, by estimation, is 60 to 65%. The left ventricle has normal function. The left ventricle has no regional wall motion abnormalities. The left ventricular internal cavity size was normal in size. There is  moderate left ventricular hypertrophy. Left ventricular diastolic parameters are consistent with Grade II diastolic dysfunction (pseudonormalization). Elevated left atrial pressure. Right Ventricle: The right ventricular size is normal. No increase in right ventricular wall thickness. Right ventricular systolic function is normal. Left Atrium: Left atrial size was severely dilated. Right Atrium: Right atrial size was not well visualized. Pericardium: There is no evidence of pericardial effusion. Mitral Valve: The MV itself is poorly visualized, unable to corroborate elevated gradient morphologically. Consider TEE if clinically indicated. The mitral valve was not well visualized. There is moderate thickening of the mitral valve leaflet(s). There is moderate calcification of the mitral valve leaflet(s). Moderate mitral annular calcification. Trivial mitral valve regurgitation. Moderate to severe mitral stenosis by mean gradient. Mean gradient 10 mmHg, HR 85 mitral valve stenosis. MV peak gradient, 15.1 mmHg. The mean mitral valve gradient is 10.0 mmHg. Tricuspid Valve: The tricuspid valve is not well visualized. Tricuspid valve  regurgitation is not demonstrated. No evidence of tricuspid stenosis. Aortic Valve: The aortic valve is tricuspid. . There is moderate thickening and moderate calcification of the aortic valve. Aortic valve regurgitation is not visualized. No aortic stenosis is present. Moderate aortic valve annular calcification. There is  moderate thickening of the aortic valve. There is moderate calcification of the aortic valve. Aortic valve mean gradient measures 7.3 mmHg. Aortic valve peak gradient measures 15.3 mmHg. Aortic valve area, by VTI measures 2.65 cm. Pulmonic Valve: The pulmonic valve was not well visualized. Pulmonic valve regurgitation is not visualized. No evidence of pulmonic stenosis. Aorta: The aortic root is normal in size and structure. Pulmonary Artery: Indeterminant PASP, inadequate TR jet. Venous: The inferior vena cava was not well visualized. IAS/Shunts: The interatrial septum was not well visualized.  LEFT VENTRICLE PLAX 2D LVIDd:         4.48 cm  Diastology LVIDs:         2.81 cm  LV e' lateral:   6.31 cm/s LV PW:         1.30 cm  LV E/e' lateral: 31.2 LV IVS:        1.25 cm  LV e' medial:    5.11 cm/s LVOT diam:     2.00 cm  LV E/e' medial:  38.6 LV SV:         88 LV SV Index:   46 LVOT Area:     3.14 cm  RIGHT VENTRICLE RV S prime:     14.00 cm/s TAPSE (M-mode): 1.9 cm LEFT ATRIUM             Index       RIGHT ATRIUM           Index LA diam:        4.90 cm 2.54 cm/m  RA Area:     15.20 cm LA Vol (A2C):   59.7 ml 30.99 ml/m RA Volume:   37.50 ml  19.46 ml/m LA Vol (A4C):   93.1 ml 48.32 ml/m LA Biplane Vol: 81.9 ml 42.51 ml/m  AORTIC VALVE AV Area (Vmax):    2.29 cm AV Area (Vmean):   2.73 cm AV Area (VTI):     2.65 cm AV Vmax:           195.46 cm/s AV Vmean:          124.467 cm/s AV VTI:            0.333 m AV Peak Grad:      15.3 mmHg AV Mean Grad:      7.3 mmHg LVOT Vmax:         142.44 cm/s LVOT Vmean:        107.981 cm/s LVOT VTI:          0.281 m LVOT/AV VTI ratio: 0.84  AORTA Ao Root  diam: 3.00 cm MITRAL VALVE MV Area (PHT): 2.07 cm     SHUNTS MV Peak grad:  15.1 mmHg    Systemic VTI:  0.28 m MV Mean grad:  10.0 mmHg    Systemic Diam: 2.00 cm MV Vmax:       1.94 m/s MV Vmean:      152.0 cm/s MV Decel Time: 367 msec MR Peak grad: 62.4 mmHg MR Vmax:      395.00 cm/s MV E velocity: 197.00 cm/s MV A velocity: 188.00 cm/s MV E/A ratio:  1.05 Carlyle Dolly MD Electronically signed by Carlyle Dolly MD Signature Date/Time: 07/28/2019/2:02:44 PM  Final     Orson Eva, DO  [facility]  If 7PM-7AM, please contact night-coverage [email or web address] Password [email or web address] 08/01/2019, 1:38 PM   LOS: 8 days

## 2019-08-02 LAB — BASIC METABOLIC PANEL
Anion gap: 10 (ref 5–15)
BUN: 50 mg/dL — ABNORMAL HIGH (ref 8–23)
CO2: 29 mmol/L (ref 22–32)
Calcium: 9.2 mg/dL (ref 8.9–10.3)
Chloride: 94 mmol/L — ABNORMAL LOW (ref 98–111)
Creatinine, Ser: 2.84 mg/dL — ABNORMAL HIGH (ref 0.44–1.00)
GFR calc Af Amer: 17 mL/min — ABNORMAL LOW (ref >60)
GFR calc non Af Amer: 15 mL/min — ABNORMAL LOW (ref >60)
Glucose, Bld: 260 mg/dL — ABNORMAL HIGH (ref 70–99)
Potassium: 4.9 mmol/L (ref 3.5–5.1)
Sodium: 133 mmol/L — ABNORMAL LOW (ref 135–145)

## 2019-08-02 LAB — GLUCOSE, CAPILLARY
Glucose-Capillary: 272 mg/dL — ABNORMAL HIGH (ref 70–99)
Glucose-Capillary: 260 mg/dL — ABNORMAL HIGH (ref 70–99)
Glucose-Capillary: 428 mg/dL — ABNORMAL HIGH (ref 70–99)
Glucose-Capillary: 362 mg/dL — ABNORMAL HIGH (ref 70–99)
Glucose-Capillary: 324 mg/dL — ABNORMAL HIGH (ref 70–99)
Glucose-Capillary: 396 mg/dL — ABNORMAL HIGH (ref 70–99)

## 2019-08-02 LAB — CBC
HCT: 26.4 % — ABNORMAL LOW (ref 36.0–46.0)
Hemoglobin: 7.5 g/dL — ABNORMAL LOW (ref 12.0–15.0)
MCH: 25.3 pg — ABNORMAL LOW (ref 26.0–34.0)
MCHC: 28.4 g/dL — ABNORMAL LOW (ref 30.0–36.0)
MCV: 89.2 fL (ref 80.0–100.0)
Platelets: 467 10*3/uL — ABNORMAL HIGH (ref 150–400)
RBC: 2.96 MIL/uL — ABNORMAL LOW (ref 3.87–5.11)
RDW: 15.3 % (ref 11.5–15.5)
WBC: 9.7 10*3/uL (ref 4.0–10.5)
nRBC: 0 % (ref 0.0–0.2)

## 2019-08-02 LAB — BRAIN NATRIURETIC PEPTIDE: B Natriuretic Peptide: 137 pg/mL — ABNORMAL HIGH (ref 0.0–100.0)

## 2019-08-02 MED ORDER — INSULIN ASPART 100 UNIT/ML ~~LOC~~ SOLN
5.0000 [IU] | Freq: Once | SUBCUTANEOUS | Status: AC
  Administered 2019-08-02: 5 [IU] via SUBCUTANEOUS

## 2019-08-02 MED ORDER — INSULIN ASPART 100 UNIT/ML ~~LOC~~ SOLN
0.0000 [IU] | Freq: Three times a day (TID) | SUBCUTANEOUS | Status: DC
  Administered 2019-08-02: 11 [IU] via SUBCUTANEOUS
  Administered 2019-08-03: 5 [IU] via SUBCUTANEOUS
  Administered 2019-08-03: 8 [IU] via SUBCUTANEOUS

## 2019-08-02 MED ORDER — INSULIN GLARGINE 100 UNIT/ML ~~LOC~~ SOLN: 5.0000 [IU] | Freq: Every day | SUBCUTANEOUS | Status: DC

## 2019-08-02 MED ORDER — SODIUM CHLORIDE 0.9 % IV SOLN
125.0000 mg | Freq: Once | INTRAVENOUS | Status: AC
  Administered 2019-08-02: 125 mg via INTRAVENOUS
  Filled 2019-08-02: qty 10

## 2019-08-02 MED ORDER — SODIUM ZIRCONIUM CYCLOSILICATE 10 G PO PACK
10.0000 g | PACK | Freq: Once | ORAL | Status: AC
  Administered 2019-08-02: 10 g via ORAL
  Filled 2019-08-02: qty 1

## 2019-08-02 MED ORDER — INSULIN GLARGINE 100 UNIT/ML ~~LOC~~ SOLN
8.0000 [IU] | Freq: Every day | SUBCUTANEOUS | Status: DC
  Administered 2019-08-02: 8 [IU] via SUBCUTANEOUS
  Filled 2019-08-02 (×4): qty 0.08

## 2019-08-02 MED ORDER — INSULIN ASPART 100 UNIT/ML ~~LOC~~ SOLN
0.0000 [IU] | Freq: Every day | SUBCUTANEOUS | Status: DC
  Administered 2019-08-03: 3 [IU] via SUBCUTANEOUS

## 2019-08-02 NOTE — Progress Notes (Signed)
PROGRESS NOTE  Amy Wiggins NWG:[id number] DOB: 14-Dec-1931 DOA: 07/24/2019 PCP: Asencion Noble, MD  Brief History:  86 year old female with history ofCKD 4,diabetes mellitus type 2, hypertension, hyperlipidemia, paroxysmal atrial fibrillation, stroke, PFO presenting with 2 to 3-day history of shortness of breath and generalized weakness.Patient says that her Lasix was stopped about 2 weeks ago by cardiologist PA.Patient says that since that time she has experienced worsening shortness of breath.The patient was noted to be fluid overloaded and started on IV lasix with slow clinical improvement. On the evening of 07/30/19, patient became unresponsive due to hypoglycemia and she was placed on a D10 drip. Her insulin was adjusted and D10 drip weaned.  She remained hyperglycemic and lower dose lantus was started   Assessment/Plan: Acute respiratory failure with hypoxia -Secondary to CHF -Presently stable on 2 L nasal cannula -Wean oxygen for saturation 92% -Personally reviewed chest x-ray--increased interstitial markings -CT chest--extensive irregular bilateral GGO, most conspicuous RUL; underlying bronchiectasis. Small bilateral pleural effusion with atelectasis vs consolidation -started cefepime>>>amox/clav at time of d/c -check PCT--0.70  Acuteon chronicdiastolic CHF -57/84/6962 echo EF 55-60%, grade 1 DD, no WMA -08/01/19--transition to po lasix -Accurate I's and O's--NEG 5.6L -NEG 5 lbs -07/09/19 afib clinic weight 85.3 kg (188 lb) -8/4/20echo--EF 55-60%, moderated MS -07/30/19--personally reviewed CXR--increased interstitial markings, but improving  Atrial fibrillation with RVR, paroxysmal -CHADSVASc = 7 -continue apixaban -start lower dose cardizem 30 mg po q6>>>cardizem CD 120 mg daily -continue amio -07/09/19 afib clinic weight 85.3 kg (188 lb) -TSH 3.105  Hypoglycemia -improved -d/c D10W -restart ISS--increase to moderate scale -restart lower dose  lantus  CKD stage4 -Baseline creatinine2.3-2.5 -will have to tolerate worse renal function for improved fluid/pulmonary status -Monitor with diuresis -A.m. BMP  Uncontrolled diabetes mellitus type 2 with hyperglycemia -6/5 holding lantus and ISS temporarily due to hypoglycemia -5/30/21hemoglobin A1c 8.4 -am cortisol--11.9  Depression/anxiety -Continue Celexa -d/c mirtazapine due to lethargy-->more alert  Hyperlipidemia -Continue statin  Goals of Care -palliative followed patient -had [place] discussion with granddaughter (POA) 6/6 again -DNR now       Status is: Inpatient  Remains inpatient appropriate because:IV treatments appropriate due to intensity of illness or inability to take PO   Dispo: The patient is from: Home              Anticipated d/c is to: SNF              Anticipated d/c date is: 1 day              Patient currently is medically stable to d/c.        Family Communication:   Granddaughter updated bedside 6/6  Consultants:  Palliative medicine  Code Status:  DNR  DVT Prophylaxis:  apixaban   Procedures: As Listed in Progress Note Above  Antibiotics: None       Subjective:   Objective: Vitals:   08/02/19 0815 08/02/19 0956 08/02/19 1000 08/02/19 1119  BP:   (!) 114/39   Pulse:  80 84   Resp:  14 14   Temp: 98.2 F (36.8 C)   97.9 F (36.6 C)  TempSrc: Oral   Oral  SpO2:  95% 98%   Weight:      Height:        Intake/Output Summary (Last 24 hours) at 08/02/2019 1229 Last data filed at 08/02/2019 1000 Gross per 24 hour  Intake 543.67 ml  Output 850 ml  Net -306.33 ml  Weight change: -1.9 kg Exam:   General:  Pt is alert, follows commands appropriately, not in acute distress  HEENT: No icterus, No thrush, No neck mass, [place]/AT  Cardiovascular: RRR, S1/S2, no rubs, no gallops  Respiratory: CTA bilaterally, no wheezing, no crackles, no rhonchi  Abdomen: Soft/+BS, non tender, non distended, no  guarding  Extremities: No edema, No lymphangitis, No petechiae, No rashes, no synovitis   Data Reviewed: I have personally reviewed following labs and imaging studies Basic Metabolic Panel: Recent Labs  Lab 07/27/19 0514 07/28/19 0542 07/29/19 9390 07/29/19 3009 07/30/19 0521 07/31/19 0405 07/31/19 2305 08/01/19 0259 08/02/19 0338  NA 135   < > 135  --  135 135  --  133* 133*  K 3.4*   < > 3.8  --  3.6 2.7*  --  4.8 4.9  CL 97*   < > 94*  --  94* 93*  --  94* 94*  CO2 27   < > 30  --  29 31  --  32 29  GLUCOSE 144*   < > 147*   < > 154* 199* 395* 277* 260*  BUN 47*   < > 47*  --  46* 51*  --  46* 50*  CREATININE 2.83*   < > 2.51*  --  2.42* 2.55*  --  2.73* 2.84*  CALCIUM 9.0   < > 9.4  --  9.1 9.0  --  9.0 9.2  MG 1.8  --   --   --  1.8 1.7  --  2.5*  --    < > = values in this interval not displayed.   Liver Function Tests: No results for input(s): AST, ALT, ALKPHOS, BILITOT, PROT, ALBUMIN in the last 168 hours. No results for input(s): LIPASE, AMYLASE in the last 168 hours. No results for input(s): AMMONIA in the last 168 hours. Coagulation Profile: No results for input(s): INR, PROTIME in the last 168 hours. CBC: Recent Labs  Lab 07/29/19 0624 07/30/19 0521 07/31/19 0405 08/01/19 0259 08/02/19 0338  WBC 7.7 8.3 8.3 9.2 9.7  HGB 7.6* 7.8* 7.3* 7.2* 7.5*  HCT 26.6* 26.8* 25.3* 25.1* 26.4*  MCV 88.4 87.3 86.9 86.6 89.2  PLT 409* 437* 418* 455* 467*   Cardiac Enzymes: No results for input(s): CKTOTAL, CKMB, CKMBINDEX, TROPONINI in the last 168 hours. BNP: Invalid input(s): POCBNP CBG: Recent Labs  Lab 08/01/19 1539 08/01/19 2127 08/02/19 0300 08/02/19 0758 08/02/19 1119  GLUCAP 319* 351* 272* 260* 428*   HbA1C: No results for input(s): HGBA1C in the last 72 hours. Urine analysis:    Component Value Date/Time   COLORURINE YELLOW 09/28/2018 1216   APPEARANCEUR HAZY (A) 09/28/2018 1216   LABSPEC 1.007 09/28/2018 1216   PHURINE 5.0 09/28/2018 1216    GLUCOSEU >=500 (A) 09/28/2018 1216   HGBUR NEGATIVE 09/28/2018 1216   BILIRUBINUR NEGATIVE 09/28/2018 1216   KETONESUR NEGATIVE 09/28/2018 1216   PROTEINUR NEGATIVE 09/28/2018 1216   UROBILINOGEN 0.2 12/12/2010 1102   NITRITE NEGATIVE 09/28/2018 1216   LEUKOCYTESUR MODERATE (A) 09/28/2018 1216   Sepsis Labs: @LABRCNTIP (procalcitonin:4,lacticidven:4) ) Recent Results (from the past 240 hour(s))  SARS Coronavirus 2 by RT PCR (hospital order, performed in [place] hospital lab) Nasopharyngeal Nasopharyngeal Swab     Status: None   Collection Time: 07/24/19  8:31 PM   Specimen: Nasopharyngeal Swab  Result Value Ref Range Status   SARS Coronavirus 2 NEGATIVE NEGATIVE Final    Comment: (NOTE) SARS-CoV-2 target nucleic acids are  NOT DETECTED. The SARS-CoV-2 RNA is generally detectable in upper and lower respiratory specimens during the acute phase of infection. The lowest concentration of SARS-CoV-2 viral copies this assay can detect is 250 copies / mL. A negative result does not preclude SARS-CoV-2 infection and should not be used as the sole basis for treatment or other patient management decisions.  A negative result may occur with improper specimen collection / handling, submission of specimen other than nasopharyngeal swab, presence of viral mutation(s) within the areas targeted by this assay, and inadequate number of viral copies (<250 copies / mL). A negative result must be combined with clinical observations, patient history, and epidemiological information. Fact Sheet for Patients:   StrictlyIdeas.no Fact Sheet for Healthcare Providers: BankingDealers.co.za This test is not yet approved or cleared  by the [place] FDA and has been authorized for detection and/or diagnosis of SARS-CoV-2 by FDA under an Emergency Use Authorization (EUA).  This EUA will remain in effect (meaning this test can be used) for the duration of  the COVID-19 declaration under Section 564(b)(1) of the Act, 21 U.S.C. section 360bbb-3(b)(1), unless the authorization is terminated or revoked sooner. Performed at [facility], [address] [place], [place] [address]   MRSA PCR Screening     Status: Abnormal   Collection Time: 07/31/19  2:31 AM   Specimen: Nasal Mucosa; Nasopharyngeal  Result Value Ref Range Status   MRSA by PCR POSITIVE (A) NEGATIVE Final    Comment:        The GeneXpert MRSA Assay (FDA approved for NASAL specimens only), is one component of a comprehensive MRSA colonization surveillance program. It is not intended to diagnose MRSA infection nor to guide or monitor treatment for MRSA infections. RESULT CALLED TO, READ BACK BY AND VERIFIED WITH: MURPHY @ 0909 ON [id number] BY HENDERSON L. Performed at [facility], [address] [place], [place] [address]      Scheduled Meds: . amiodarone  200 mg Oral Daily  . apixaban  2.5 mg Oral BID  . atorvastatin  20 mg Oral QHS  . buPROPion  300 mg Oral Daily  . Chlorhexidine Gluconate Cloth  6 each Topical Daily  . citalopram  20 mg Oral Daily  . diltiazem  120 mg Oral Daily  . folic acid  1 mg Oral Daily  . furosemide  40 mg Oral Daily  . insulin aspart  0-5 Units Subcutaneous QHS  . insulin aspart  0-9 Units Subcutaneous TID WC  . insulin glargine  8 Units Subcutaneous Daily  . mupirocin ointment  1 application Nasal BID  . pantoprazole  40 mg Oral Daily  . sodium chloride flush  3 mL Intravenous Q12H  . traZODone  150 mg Oral QHS   Continuous Infusions: . sodium chloride    . ceFEPime (MAXIPIME) IV 200 mL/hr at 08/01/19 1548    Procedures/Studies: DG Chest 2 View  Result Date: 07/24/2019 CLINICAL DATA:  86 year old female with shortness of breath. EXAM: CHEST - 2 VIEW COMPARISON:  Chest radiograph dated 10/02/2018. FINDINGS: There is diffuse vascular and interstitial prominence which may represent edema. Confluent areas of air pace density involving  the right lung and left lung base may represent edema or pneumonia. Clinical correlation is recommended. Probable small left pleural effusion. No pneumothorax. Stable mild cardiomegaly. A loop recorder device is noted. Atherosclerotic calcification of the aorta. No acute osseous pathology. Degenerative changes. IMPRESSION: Bilateral pulmonary opacities may represent edema or pneumonia. Clinical correlation is recommended. Electronically Signed  By: Anner Crete M.D.   On: 07/24/2019 20:20   CT CHEST WO CONTRAST  Result Date: 07/31/2019 CLINICAL DATA:  Chronic dyspnea EXAM: CT CHEST WITHOUT CONTRAST TECHNIQUE: Multidetector CT imaging of the chest was performed following the standard protocol without IV contrast. COMPARISON:  CT chest, 09/29/2018 FINDINGS: Cardiovascular: Aortic atherosclerosis. Normal heart size. Three-vessel coronary artery calcifications. No pericardial effusion. Mediastinum/Nodes: No enlarged mediastinal, hilar, or axillary lymph nodes. Thyroid gland, trachea, and esophagus demonstrate no significant findings. Lungs/Pleura: Small bilateral pleural effusions and associated atelectasis or consolidation. There is extensive irregular bilateral ground-glass airspace opacity, most conspicuous in the right upper lobe (series 4, image 46). There is mild tubular bronchiectasis throughout. Upper Abdomen: No acute abnormality. Musculoskeletal: No chest wall mass or suspicious bone lesions identified. IMPRESSION: 1. There is extensive irregular bilateral ground-glass airspace opacity, most conspicuous in the right upper lobe. These findings are new compared to prior CT dated 09/29/2018 and most consistent with multifocal infection. 2. There is mild underlying tubular bronchiectasis throughout. 3. Small bilateral pleural effusions and associated atelectasis or consolidation. 4. Coronary artery disease.  Aortic Atherosclerosis (ICD10-I70.0). Electronically Signed   By: Eddie Candle M.D.   On:  07/31/2019 15:32   DG CHEST PORT 1 VIEW  Result Date: 07/30/2019 CLINICAL DATA:  Shortness of breath.  Rapid response. EXAM: PORTABLE CHEST 1 VIEW COMPARISON:  Radiograph earlier this day. FINDINGS: Unchanged heart size and mediastinal contours. Aortic atherosclerosis. Loop recorder projects over the left chest wall. Generalized pulmonary edema with slight improvement, however there is worsening and right suprahilar patchy opacity. No pneumothorax or large pleural effusion. Bones are diffusely under mineralized without acute osseous abnormality. IMPRESSION: Slight improvement in background pulmonary edema from earlier this day, however there is worsening right suprahilar patchy opacity, may be developing asymmetric pulmonary edema or pneumonia. Given relative rapid progression, aspiration is considered. Recommend correlation for aspiration risk factors. Electronically Signed   By: Keith Rake M.D.   On: 07/30/2019 22:11   DG CHEST PORT 1 VIEW  Result Date: 07/30/2019 CLINICAL DATA:  Heart failure. EXAM: PORTABLE CHEST 1 VIEW COMPARISON:  Chest x-ray dated July 27, 2019. FINDINGS: Stable cardiomegaly. Diffuse interstitial thickening with more patchy airspace opacities in the right upper lobe are unchanged. No pneumothorax or large pleural effusion. No acute osseous abnormality. Loop recorder again noted. IMPRESSION: 1. Unchanged mild pulmonary edema. Electronically Signed   By: Titus Dubin M.D.   On: 07/30/2019 08:12   DG CHEST PORT 1 VIEW  Result Date: 07/27/2019 CLINICAL DATA:  Patient admitted 07/24/2019 with shortness of breath. EXAM: PORTABLE CHEST 1 VIEW COMPARISON:  CT chest 09/29/2018. Single-view of the chest 07/24/2019 and 07/26/2019. FINDINGS: Loop recorder again noted. Cardiomegaly is also again seen. Trace bilateral pleural effusions. Aeration in the right chest continues to improved. The left lung is clear. Atherosclerosis noted. IMPRESSION: Continued improvement in aeration in the  right chest. Trace bilateral pleural effusions. Cardiomegaly. Aortic Atherosclerosis (ICD10-I70.0). Electronically Signed   By: Inge Rise M.D.   On: 07/27/2019 09:21   DG CHEST PORT 1 VIEW  Result Date: 07/26/2019 CLINICAL DATA:  Pulmonary edema EXAM: PORTABLE CHEST 1 VIEW COMPARISON:  Jul 24, 2019 FINDINGS: Again noted is cardiomegaly. There is slight interval improvement in the fluffy airspace opacities and interstitial opacities throughout both lungs, right greater than left. No pleural effusion. No acute osseous abnormality. IMPRESSION: Slight interval improvement in the diffuse pulmonary edema, right greater than left. Electronically Signed   By: Ebony Cargo.D.  On: 07/26/2019 05:05   ECHOCARDIOGRAM COMPLETE  Result Date: 07/28/2019    ECHOCARDIOGRAM REPORT   Patient Name:   AYJAH SHOW Date of Exam: 07/28/2019 Medical Rec #:  [id number]         Height:       64.0 in Accession #:    [id number]        Weight:       192.9 lb Date of Birth:  [birth year]        BSA:          1.927 m Patient Age:    41 years          BP:           110/56 mmHg Patient Gender: F                 HR:           93 bpm. Exam Location:  [facility] Procedure: 2D Echo Indications:    CHF-Acute Diastolic 903.00 / P23.30  History:        Patient has prior history of Echocardiogram examinations, most                 recent 09/29/2018. Stroke, Arrythmias:Atrial Fibrillation; Risk                 Factors:Diabetes, Dyslipidemia, Hypertension and Non-Smoker. PFO                 (patent foramen ovale.  Sonographer:    Leavy Cella RDCS (AE) Referring Phys: [phone] Jamez Ambrocio IMPRESSIONS  1. Left ventricular ejection fraction, by estimation, is 60 to 65%. The left ventricle has normal function. The left ventricle has no regional wall motion abnormalities. There is moderate left ventricular hypertrophy. Left ventricular diastolic parameters are consistent with Grade II diastolic dysfunction (pseudonormalization). Elevated left atrial  pressure.  2. Right ventricular systolic function is normal. The right ventricular size is normal.  3. Left atrial size was severely dilated.  4. The MV itself is poorly visualized, unable to corroborate elevated gradient morphologically. Consider TEE if clinically indicated. . The mitral valve was not well visualized. Trivial mitral valve regurgitation. Moderate to severe mitral stenosis by mean gradient. Mean gradient 10 mmHg, HR 85  5. The aortic valve is tricuspid. Aortic valve regurgitation is not visualized. No aortic stenosis is present. FINDINGS  Left Ventricle: Left ventricular ejection fraction, by estimation, is 60 to 65%. The left ventricle has normal function. The left ventricle has no regional wall motion abnormalities. The left ventricular internal cavity size was normal in size. There is  moderate left ventricular hypertrophy. Left ventricular diastolic parameters are consistent with Grade II diastolic dysfunction (pseudonormalization). Elevated left atrial pressure. Right Ventricle: The right ventricular size is normal. No increase in right ventricular wall thickness. Right ventricular systolic function is normal. Left Atrium: Left atrial size was severely dilated. Right Atrium: Right atrial size was not well visualized. Pericardium: There is no evidence of pericardial effusion. Mitral Valve: The MV itself is poorly visualized, unable to corroborate elevated gradient morphologically. Consider TEE if clinically indicated. The mitral valve was not well visualized. There is moderate thickening of the mitral valve leaflet(s). There is moderate calcification of the mitral valve leaflet(s). Moderate mitral annular calcification. Trivial mitral valve regurgitation. Moderate to severe mitral stenosis by mean gradient. Mean gradient 10 mmHg, HR 85 mitral valve stenosis. MV peak gradient, 15.1 mmHg. The mean mitral valve gradient is 10.0 mmHg. Tricuspid Valve: The tricuspid valve  is not well visualized.  Tricuspid valve regurgitation is not demonstrated. No evidence of tricuspid stenosis. Aortic Valve: The aortic valve is tricuspid. . There is moderate thickening and moderate calcification of the aortic valve. Aortic valve regurgitation is not visualized. No aortic stenosis is present. Moderate aortic valve annular calcification. There is  moderate thickening of the aortic valve. There is moderate calcification of the aortic valve. Aortic valve mean gradient measures 7.3 mmHg. Aortic valve peak gradient measures 15.3 mmHg. Aortic valve area, by VTI measures 2.65 cm. Pulmonic Valve: The pulmonic valve was not well visualized. Pulmonic valve regurgitation is not visualized. No evidence of pulmonic stenosis. Aorta: The aortic root is normal in size and structure. Pulmonary Artery: Indeterminant PASP, inadequate TR jet. Venous: The inferior vena cava was not well visualized. IAS/Shunts: The interatrial septum was not well visualized.  LEFT VENTRICLE PLAX 2D LVIDd:         4.48 cm  Diastology LVIDs:         2.81 cm  LV e' lateral:   6.31 cm/s LV PW:         1.30 cm  LV E/e' lateral: 31.2 LV IVS:        1.25 cm  LV e' medial:    5.11 cm/s LVOT diam:     2.00 cm  LV E/e' medial:  38.6 LV SV:         88 LV SV Index:   46 LVOT Area:     3.14 cm  RIGHT VENTRICLE RV S prime:     14.00 cm/s TAPSE (M-mode): 1.9 cm LEFT ATRIUM             Index       RIGHT ATRIUM           Index LA diam:        4.90 cm 2.54 cm/m  RA Area:     15.20 cm LA Vol (A2C):   59.7 ml 30.99 ml/m RA Volume:   37.50 ml  19.46 ml/m LA Vol (A4C):   93.1 ml 48.32 ml/m LA Biplane Vol: 81.9 ml 42.51 ml/m  AORTIC VALVE AV Area (Vmax):    2.29 cm AV Area (Vmean):   2.73 cm AV Area (VTI):     2.65 cm AV Vmax:           195.46 cm/s AV Vmean:          124.467 cm/s AV VTI:            0.333 m AV Peak Grad:      15.3 mmHg AV Mean Grad:      7.3 mmHg LVOT Vmax:         142.44 cm/s LVOT Vmean:        107.981 cm/s LVOT VTI:          0.281 m LVOT/AV VTI ratio:  0.84  AORTA Ao Root diam: 3.00 cm MITRAL VALVE MV Area (PHT): 2.07 cm     SHUNTS MV Peak grad:  15.1 mmHg    Systemic VTI:  0.28 m MV Mean grad:  10.0 mmHg    Systemic Diam: 2.00 cm MV Vmax:       1.94 m/s MV Vmean:      152.0 cm/s MV Decel Time: 367 msec MR Peak grad: 62.4 mmHg MR Vmax:      395.00 cm/s MV E velocity: 197.00 cm/s MV A velocity: 188.00 cm/s MV E/A ratio:  1.05 Carlyle Dolly MD Electronically signed by Carlyle Dolly  MD Signature Date/Time: 07/28/2019/2:02:44 PM    Final     Orson Eva, DO  [facility]  If 7PM-7AM, please contact night-coverage [email or web address] Password [email or web address] 08/02/2019, 12:29 PM   LOS: 9 days

## 2019-08-02 NOTE — Progress Notes (Signed)
Report given to accepting RN on 300, [place]. Patient transported to 328 via wheelchair in stable condition.

## 2019-08-02 NOTE — Care Management Important Message (Signed)
Important Message  Patient Details  Name: Amy Wiggins MRN: [id number] Date of Birth: [birth year]   Medicare Important Message Given:  Yes     [name] 08/02/2019, 4:35 PM

## 2019-08-02 NOTE — Progress Notes (Signed)
Patient's granddaughter [name] aware of patient's transfer to [facility].

## 2019-08-02 NOTE — TOC Progression Note (Signed)
Transition of Care [facility]) - Progression Note    Patient Details  Name: Amy Wiggins MRN: [id number] Date of Birth: Apr 02, 1931  [facility] CM/SW Contact  Salome Arnt, [place] Phone Number: 08/02/2019, 11:15 AM  Clinical Narrative:  Pt's granddaughter, [name] accepts bed offer at [facility]. [facility] notified and will start authorization for SNF. [facility] aware of 30 day pasarr. Pt will require repeat COVID test prior to d/c to SNF. MD notified.       Expected Discharge Plan: [place] Barriers to Discharge: Continued Medical Work up  Expected Discharge Plan and Services Expected Discharge Plan: [place] Choice: [place] arrangements for the past 2 months: Single Family Home                                       Social Determinants of Health (SDOH) Interventions    Readmission Risk Interventions No flowsheet data found.

## 2019-08-02 NOTE — Progress Notes (Signed)
Palliative:  HPI:86 y.o.femalewith past medical history of stroke, atrial fibrillation on Eliquis, HFpEF, hypertension, hyperlipidemia, CKD stage IV, diabetes, depressionadmitted on 5/29/2021with shortness of breath after Lasix placed on hold due to worsening renal function prior to admission.Continues with shortness of breath even with IV diuresis. Palliative care requested to assist with goals of care conversation.   I met today at Amy Wiggins's bedside. She continues to be very lethargic today but this is fairly consistent with her status last week which is worsened from her baseline at home. She denies any pain or discomfort and tells me "I'm fine." She continues to have poor understanding or insight into her illness.   I called and spoke with granddaughter/HCPOA, Amy Wiggins. Amy Wiggins has decided to put in place DNR. She has good questions about prognosis and feels that Amy Wiggins's life is limited and is preparing herself and trying to explain to family to the same. She has concerns that some family members are not understanding that this will not improve and that Amy Wiggins is likely to continue to decline to end of life. I offered to help discuss with family as desired to help Amy Wiggins with this burden to explain to everyone. Amy Wiggins is appreciative. We also discussed plan to have palliative care to follow and continue to discuss with Amy Wiggins into the near future with concerns that she will continue to decline and how to best care for her at the end of her life.   All questions/concerns addressed. Emotional support provided.   Exam: Lethargic, underlying (likely baseline) confusion. No distress. Breathing regular, unlabored. Abd soft, flat. Generalized weakness.   Plan: - DNR - Outpatient palliative to follow at SNF. Will likely need hospice in the near future.   25 min  Alicia Parker, NP Palliative Medicine Team Pager [phone] (Please see [email or web address] for schedule) Team Phone [phone]     Greater than 50%  of this time was spent counseling and coordinating care related to the above assessment and plan  

## 2019-08-02 NOTE — Progress Notes (Signed)
Inpatient Diabetes Program Recommendations  AACE/ADA: New Consensus Statement on Inpatient Glycemic Control   Target Ranges:  Prepandial:   less than 140 mg/dL      Peak postprandial:   less than 180 mg/dL (1-2 hours)      Critically ill patients:  140 - 180 mg/dL   Results for SAIMA, MONTERROSO (MRN [id number]) as of 08/02/2019 07:37  Ref. Range 08/01/2019 07:29 08/01/2019 11:06 08/01/2019 15:39 08/01/2019 21:27 08/02/2019 03:00  Glucose-Capillary Latest Ref Range: 70 - 99 mg/dL 181 (H) 369 (H) 319 (H) 351 (H) 272 (H)   Review of Glycemic Control  Diabetes history: DM2 Outpatient Diabetes medications: Lantus 50 units daily Current orders for Inpatient glycemic control: Novolog 0-9 units TID with meals, Novolog 0-5 units QHS  Inpatient Diabetes Program Recommendations:   Insulin - Basal: Noted patient received Lantus 20 units at 12:00 on 07/30/19 and experienced hypoglycemia. Hypoglycemia resolved and glucose has ranged from 181-369 mg/dl over the past 24 hours. Please consider ordering Lantus 8 units daily.  Thanks, Barnie Alderman, RN, MSN, CDE Diabetes Coordinator Inpatient Diabetes Program [phone] (Team Pager from 8am to 5pm)

## 2019-08-02 NOTE — Discharge Summary (Signed)
Physician Discharge Summary  Amy Wiggins SVX:[id number] DOB: 21-May-1931 DOA: 07/24/2019  PCP: Asencion Noble, MD  Admit date: 07/24/2019 Discharge date: 08/03/19  Admitted From: Home Disposition:  SNF  Recommendations for Outpatient Follow-up:  1. Follow up with PCP in 1-2 weeks 2. Please obtain BMP/CBC in one week 3. Wean patient off 2 L Ebony for saturation >92 %     Discharge Condition: Stable CODE STATUS: DNR Diet recommendation: Heart Healthy / Carb Modified    Brief/Interim Summary: 86 year old female with history ofCKD 4,diabetes mellitus type 2, hypertension, hyperlipidemia, paroxysmal atrial fibrillation, stroke, PFO presenting with 2 to 3-day history of shortness of breath and generalized weakness.Patient says that her Lasix was stopped about 2 weeks ago by cardiologist PA.Patient says that since that time she has experienced worsening shortness of breath.The patient was noted to be fluid overloaded and started on IV lasix with slow clinical improvement. On the evening of 07/30/19, patient became unresponsive due to hypoglycemia and she was placed on a D10 drip. Her insulin was adjusted and D10 drip weaned.  She remained hyperglycemic and lower dose lantus was started  Discharge Diagnoses:  Acute respiratory failure with hypoxia -Secondary to CHF -Presently stable on 2 L nasal cannula -Wean oxygen for saturation 92% -Personally reviewed chest x-ray--increased interstitial markings -CT chest--extensive irregular bilateral GGO, most conspicuous RUL; underlying bronchiectasis.  Small bilateral pleural effusion with atelectasis vs consolidation -started cefepime>>>amox/clav x 4 more days after d/c -check PCT--0.70  Acuteon chronicdiastolic CHF -23/30/0762 echo EF 55-60%, grade 1 DD, no WMA -08/01/19--transition to po lasix -Accurate I's and O's--NEG 5.6L -NEG 5 lbs -07/09/19 afib clinic weight 85.3 kg (188 lb) -8/4/20echo--EF 55-60%, moderated  MS -07/30/19--personally reviewed CXR--increased interstitial markings, but improving  Atrial fibrillation with RVR, paroxysmal -CHADSVASc = 7 -continue apixaban -start lower dose cardizem 30 mg po q6>>>cardizem CD 120 mg daily -continue amio -07/09/19 afib clinic weight 85.3 kg (188 lb) -TSH 3.105  Hypoglycemia -improved -d/c D10W -restart ISS -restart lower dose lantus-->20 units daily  CKD stage4 -Baseline creatinine2.3-2.5 -will have to tolerate worse renal function for improved fluid/pulmonary status -Monitor with diuresis -A.m. BMP  Uncontrolled diabetes mellitus type 2 with hyperglycemia -6/5 holding lantus and ISS temporarily due to hypoglycemia -5/30/21hemoglobin A1c 8.4 -am cortisol--11.9 -restart lower dose lantus -allow more liberal glycemic control at this point  Depression/anxiety -Continue Celexa -d/c mirtazapine due to lethargy-->more alert  Hyperlipidemia -Continue statin  Goals of Care -palliative followed patient -had [place] discussion with granddaughter (POA) 6/6 again -DNR now    Discharge Instructions   Allergies as of 08/03/2019   No Known Allergies     Medication List    STOP taking these medications   Lantus SoloStar 100 UNIT/ML Solostar Pen Generic drug: insulin glargine Replaced by: insulin glargine 100 UNIT/ML injection   losartan 25 MG tablet Commonly known as: COZAAR   mirtazapine 15 MG tablet Commonly known as: REMERON   potassium chloride SA 20 MEQ tablet Commonly known as: KLOR-CON     TAKE these medications   acetaminophen 500 MG tablet Commonly known as: TYLENOL Take 1,000 mg by mouth at bedtime. Take 2 tablets (1000 mg) by mouth daily at bedtime, may also take 2 tablets during the day as needed for pain   amiodarone 200 MG tablet Commonly known as: PACERONE Take 1 tablet (200 mg total) by mouth daily.   amoxicillin-clavulanate 500-125 MG tablet Commonly known as: AUGMENTIN Take 1 tablet (500 mg  total) by mouth 2 (two) times daily. X 4  more days   atorvastatin 20 MG tablet Commonly known as: LIPITOR Take 2 tablets (40 mg total) by mouth every evening. 4pm What changed:   how much to take  when to take this  additional instructions   buPROPion 300 MG 24 hr tablet Commonly known as: WELLBUTRIN XL Take 300 mg by mouth daily.   citalopram 20 MG tablet Commonly known as: CELEXA Take 20 mg by mouth daily.   diltiazem 120 MG 24 hr capsule Commonly known as: CARDIZEM CD Take 1 capsule (120 mg total) by mouth daily. Start taking on: August 04, 2019 What changed:   medication strength  how much to take   Eliquis 2.5 MG Tabs tablet Generic drug: apixaban Take 2.5 mg by mouth 2 (two) times daily.   furosemide 40 MG tablet Commonly known as: LASIX Take 1 tablet (40 mg total) by mouth daily.   Ginkgo Biloba 120 MG Caps Take 120 mg by mouth daily.   insulin glargine 100 UNIT/ML injection Commonly known as: LANTUS Inject 0.2 mLs (20 Units total) into the skin daily. Replaces: Lantus SoloStar 100 UNIT/ML Solostar Pen   Magnesium Oxide 250 MG Tabs Take 500 mg by mouth every evening. 500mg  tablets on hand   multivitamin with minerals Tabs tablet Take 1 tablet by mouth at bedtime. For supplement   pantoprazole 40 MG tablet Commonly known as: PROTONIX Take 40 mg by mouth daily. For acid reflux/gerd   PreserVision AREDS 2 Caps Take 1 capsule by mouth 2 (two) times daily.   traZODone 100 MG tablet Commonly known as: DESYREL Take 150 mg by mouth at bedtime.      Contact information for after-discharge care    [facility] .   Service: Skilled Nursing Contact information: [address] [place] [phone]             No Known Allergies  Consultations:  palliative   Procedures/Studies: DG Chest 2 View  Result Date: 07/24/2019 CLINICAL DATA:  86 year old female with shortness  of breath. EXAM: CHEST - 2 VIEW COMPARISON:  Chest radiograph dated 10/02/2018. FINDINGS: There is diffuse vascular and interstitial prominence which may represent edema. Confluent areas of air pace density involving the right lung and left lung base may represent edema or pneumonia. Clinical correlation is recommended. Probable small left pleural effusion. No pneumothorax. Stable mild cardiomegaly. A loop recorder device is noted. Atherosclerotic calcification of the aorta. No acute osseous pathology. Degenerative changes. IMPRESSION: Bilateral pulmonary opacities may represent edema or pneumonia. Clinical correlation is recommended. Electronically Signed   By: Anner Crete M.D.   On: 07/24/2019 20:20   CT CHEST WO CONTRAST  Result Date: 07/31/2019 CLINICAL DATA:  Chronic dyspnea EXAM: CT CHEST WITHOUT CONTRAST TECHNIQUE: Multidetector CT imaging of the chest was performed following the standard protocol without IV contrast. COMPARISON:  CT chest, 09/29/2018 FINDINGS: Cardiovascular: Aortic atherosclerosis. Normal heart size. Three-vessel coronary artery calcifications. No pericardial effusion. Mediastinum/Nodes: No enlarged mediastinal, hilar, or axillary lymph nodes. Thyroid gland, trachea, and esophagus demonstrate no significant findings. Lungs/Pleura: Small bilateral pleural effusions and associated atelectasis or consolidation. There is extensive irregular bilateral ground-glass airspace opacity, most conspicuous in the right upper lobe (series 4, image 46). There is mild tubular bronchiectasis throughout. Upper Abdomen: No acute abnormality. Musculoskeletal: No chest wall mass or suspicious bone lesions identified. IMPRESSION: 1. There is extensive irregular bilateral ground-glass airspace opacity, most conspicuous in the right upper lobe. These findings are new  compared to prior CT dated 09/29/2018 and most consistent with multifocal infection. 2. There is mild underlying tubular bronchiectasis  throughout. 3. Small bilateral pleural effusions and associated atelectasis or consolidation. 4. Coronary artery disease.  Aortic Atherosclerosis (ICD10-I70.0). Electronically Signed   By: Eddie Candle M.D.   On: 07/31/2019 15:32   DG CHEST PORT 1 VIEW  Result Date: 07/30/2019 CLINICAL DATA:  Shortness of breath.  Rapid response. EXAM: PORTABLE CHEST 1 VIEW COMPARISON:  Radiograph earlier this day. FINDINGS: Unchanged heart size and mediastinal contours. Aortic atherosclerosis. Loop recorder projects over the left chest wall. Generalized pulmonary edema with slight improvement, however there is worsening and right suprahilar patchy opacity. No pneumothorax or large pleural effusion. Bones are diffusely under mineralized without acute osseous abnormality. IMPRESSION: Slight improvement in background pulmonary edema from earlier this day, however there is worsening right suprahilar patchy opacity, may be developing asymmetric pulmonary edema or pneumonia. Given relative rapid progression, aspiration is considered. Recommend correlation for aspiration risk factors. Electronically Signed   By: Keith Rake M.D.   On: 07/30/2019 22:11   DG CHEST PORT 1 VIEW  Result Date: 07/30/2019 CLINICAL DATA:  Heart failure. EXAM: PORTABLE CHEST 1 VIEW COMPARISON:  Chest x-ray dated July 27, 2019. FINDINGS: Stable cardiomegaly. Diffuse interstitial thickening with more patchy airspace opacities in the right upper lobe are unchanged. No pneumothorax or large pleural effusion. No acute osseous abnormality. Loop recorder again noted. IMPRESSION: 1. Unchanged mild pulmonary edema. Electronically Signed   By: Titus Dubin M.D.   On: 07/30/2019 08:12   DG CHEST PORT 1 VIEW  Result Date: 07/27/2019 CLINICAL DATA:  Patient admitted 07/24/2019 with shortness of breath. EXAM: PORTABLE CHEST 1 VIEW COMPARISON:  CT chest 09/29/2018. Single-view of the chest 07/24/2019 and 07/26/2019. FINDINGS: Loop recorder again noted.  Cardiomegaly is also again seen. Trace bilateral pleural effusions. Aeration in the right chest continues to improved. The left lung is clear. Atherosclerosis noted. IMPRESSION: Continued improvement in aeration in the right chest. Trace bilateral pleural effusions. Cardiomegaly. Aortic Atherosclerosis (ICD10-I70.0). Electronically Signed   By: Inge Rise M.D.   On: 07/27/2019 09:21   DG CHEST PORT 1 VIEW  Result Date: 07/26/2019 CLINICAL DATA:  Pulmonary edema EXAM: PORTABLE CHEST 1 VIEW COMPARISON:  Jul 24, 2019 FINDINGS: Again noted is cardiomegaly. There is slight interval improvement in the fluffy airspace opacities and interstitial opacities throughout both lungs, right greater than left. No pleural effusion. No acute osseous abnormality. IMPRESSION: Slight interval improvement in the diffuse pulmonary edema, right greater than left. Electronically Signed   By: Prudencio Pair M.D.   On: 07/26/2019 05:05   ECHOCARDIOGRAM COMPLETE  Result Date: 07/28/2019    ECHOCARDIOGRAM REPORT   Patient Name:   Amy Wiggins Date of Exam: 07/28/2019 Medical Rec #:  [id number]         Height:       64.0 in Accession #:    [id number]        Weight:       192.9 lb Date of Birth:  1932/01/18        BSA:          1.927 m Patient Age:    38 years          BP:           110/56 mmHg Patient Gender: F                 HR:  93 bpm. Exam Location:  [facility] Procedure: 2D Echo Indications:    CHF-Acute Diastolic 638.45 / X64.68  History:        Patient has prior history of Echocardiogram examinations, most                 recent 09/29/2018. Stroke, Arrythmias:Atrial Fibrillation; Risk                 Factors:Diabetes, Dyslipidemia, Hypertension and Non-Smoker. PFO                 (patent foramen ovale.  Sonographer:    Leavy Cella RDCS (AE) Referring Phys: [phone] Tomer Chalmers IMPRESSIONS  1. Left ventricular ejection fraction, by estimation, is 60 to 65%. The left ventricle has normal function. The left ventricle has  no regional wall motion abnormalities. There is moderate left ventricular hypertrophy. Left ventricular diastolic parameters are consistent with Grade II diastolic dysfunction (pseudonormalization). Elevated left atrial pressure.  2. Right ventricular systolic function is normal. The right ventricular size is normal.  3. Left atrial size was severely dilated.  4. The MV itself is poorly visualized, unable to corroborate elevated gradient morphologically. Consider TEE if clinically indicated. . The mitral valve was not well visualized. Trivial mitral valve regurgitation. Moderate to severe mitral stenosis by mean gradient. Mean gradient 10 mmHg, HR 85  5. The aortic valve is tricuspid. Aortic valve regurgitation is not visualized. No aortic stenosis is present. FINDINGS  Left Ventricle: Left ventricular ejection fraction, by estimation, is 60 to 65%. The left ventricle has normal function. The left ventricle has no regional wall motion abnormalities. The left ventricular internal cavity size was normal in size. There is  moderate left ventricular hypertrophy. Left ventricular diastolic parameters are consistent with Grade II diastolic dysfunction (pseudonormalization). Elevated left atrial pressure. Right Ventricle: The right ventricular size is normal. No increase in right ventricular wall thickness. Right ventricular systolic function is normal. Left Atrium: Left atrial size was severely dilated. Right Atrium: Right atrial size was not well visualized. Pericardium: There is no evidence of pericardial effusion. Mitral Valve: The MV itself is poorly visualized, unable to corroborate elevated gradient morphologically. Consider TEE if clinically indicated. The mitral valve was not well visualized. There is moderate thickening of the mitral valve leaflet(s). There is moderate calcification of the mitral valve leaflet(s). Moderate mitral annular calcification. Trivial mitral valve regurgitation. Moderate to severe mitral  stenosis by mean gradient. Mean gradient 10 mmHg, HR 85 mitral valve stenosis. MV peak gradient, 15.1 mmHg. The mean mitral valve gradient is 10.0 mmHg. Tricuspid Valve: The tricuspid valve is not well visualized. Tricuspid valve regurgitation is not demonstrated. No evidence of tricuspid stenosis. Aortic Valve: The aortic valve is tricuspid. . There is moderate thickening and moderate calcification of the aortic valve. Aortic valve regurgitation is not visualized. No aortic stenosis is present. Moderate aortic valve annular calcification. There is  moderate thickening of the aortic valve. There is moderate calcification of the aortic valve. Aortic valve mean gradient measures 7.3 mmHg. Aortic valve peak gradient measures 15.3 mmHg. Aortic valve area, by VTI measures 2.65 cm. Pulmonic Valve: The pulmonic valve was not well visualized. Pulmonic valve regurgitation is not visualized. No evidence of pulmonic stenosis. Aorta: The aortic root is normal in size and structure. Pulmonary Artery: Indeterminant PASP, inadequate TR jet. Venous: The inferior vena cava was not well visualized. IAS/Shunts: The interatrial septum was not well visualized.  LEFT VENTRICLE PLAX 2D LVIDd:  4.48 cm  Diastology LVIDs:         2.81 cm  LV e' lateral:   6.31 cm/s LV PW:         1.30 cm  LV E/e' lateral: 31.2 LV IVS:        1.25 cm  LV e' medial:    5.11 cm/s LVOT diam:     2.00 cm  LV E/e' medial:  38.6 LV SV:         88 LV SV Index:   46 LVOT Area:     3.14 cm  RIGHT VENTRICLE RV S prime:     14.00 cm/s TAPSE (M-mode): 1.9 cm LEFT ATRIUM             Index       RIGHT ATRIUM           Index LA diam:        4.90 cm 2.54 cm/m  RA Area:     15.20 cm LA Vol (A2C):   59.7 ml 30.99 ml/m RA Volume:   37.50 ml  19.46 ml/m LA Vol (A4C):   93.1 ml 48.32 ml/m LA Biplane Vol: 81.9 ml 42.51 ml/m  AORTIC VALVE AV Area (Vmax):    2.29 cm AV Area (Vmean):   2.73 cm AV Area (VTI):     2.65 cm AV Vmax:           195.46 cm/s AV Vmean:           124.467 cm/s AV VTI:            0.333 m AV Peak Grad:      15.3 mmHg AV Mean Grad:      7.3 mmHg LVOT Vmax:         142.44 cm/s LVOT Vmean:        107.981 cm/s LVOT VTI:          0.281 m LVOT/AV VTI ratio: 0.84  AORTA Ao Root diam: 3.00 cm MITRAL VALVE MV Area (PHT): 2.07 cm     SHUNTS MV Peak grad:  15.1 mmHg    Systemic VTI:  0.28 m MV Mean grad:  10.0 mmHg    Systemic Diam: 2.00 cm MV Vmax:       1.94 m/s MV Vmean:      152.0 cm/s MV Decel Time: 367 msec MR Peak grad: 62.4 mmHg MR Vmax:      395.00 cm/s MV E velocity: 197.00 cm/s MV A velocity: 188.00 cm/s MV E/A ratio:  1.05 Carlyle Dolly MD Electronically signed by Carlyle Dolly MD Signature Date/Time: 07/28/2019/2:02:44 PM    Final         Discharge Exam: Vitals:   08/03/19 0421 08/03/19 0839  BP: (!) 120/52 (!) 116/52  Pulse: 76 83  Resp: 18 16  Temp: 98.1 F (36.7 C) 97.9 F (36.6 C)  SpO2: 95% 96%   Vitals:   08/02/19 2030 08/03/19 0421 08/03/19 0500 08/03/19 0839  BP: (!) 127/48 (!) 120/52  (!) 116/52  Pulse: 81 76  83  Resp: 17 18  16   Temp: 97.9 F (36.6 C) 98.1 F (36.7 C)  97.9 F (36.6 C)  TempSrc: Oral Oral  Oral  SpO2: 99% 95%  96%  Weight: 86.9 kg  88.9 kg   Height: 5\' 4"  (1.626 m)       General: Pt is alert, awake, not in acute distress Cardiovascular: RRR, S1/S2 +, no rubs, no gallops Respiratory: bibasilar crackles no wheeze Abdominal:  Soft, NT, ND, bowel sounds + Extremities: no edema, no cyanosis   The results of significant diagnostics from this hospitalization (including imaging, microbiology, ancillary and laboratory) are listed below for reference.    Significant Diagnostic Studies: DG Chest 2 View  Result Date: 07/24/2019 CLINICAL DATA:  86 year old female with shortness of breath. EXAM: CHEST - 2 VIEW COMPARISON:  Chest radiograph dated 10/02/2018. FINDINGS: There is diffuse vascular and interstitial prominence which may represent edema. Confluent areas of air pace density involving the  right lung and left lung base may represent edema or pneumonia. Clinical correlation is recommended. Probable small left pleural effusion. No pneumothorax. Stable mild cardiomegaly. A loop recorder device is noted. Atherosclerotic calcification of the aorta. No acute osseous pathology. Degenerative changes. IMPRESSION: Bilateral pulmonary opacities may represent edema or pneumonia. Clinical correlation is recommended. Electronically Signed   By: Anner Crete M.D.   On: 07/24/2019 20:20   CT CHEST WO CONTRAST  Result Date: 07/31/2019 CLINICAL DATA:  Chronic dyspnea EXAM: CT CHEST WITHOUT CONTRAST TECHNIQUE: Multidetector CT imaging of the chest was performed following the standard protocol without IV contrast. COMPARISON:  CT chest, 09/29/2018 FINDINGS: Cardiovascular: Aortic atherosclerosis. Normal heart size. Three-vessel coronary artery calcifications. No pericardial effusion. Mediastinum/Nodes: No enlarged mediastinal, hilar, or axillary lymph nodes. Thyroid gland, trachea, and esophagus demonstrate no significant findings. Lungs/Pleura: Small bilateral pleural effusions and associated atelectasis or consolidation. There is extensive irregular bilateral ground-glass airspace opacity, most conspicuous in the right upper lobe (series 4, image 46). There is mild tubular bronchiectasis throughout. Upper Abdomen: No acute abnormality. Musculoskeletal: No chest wall mass or suspicious bone lesions identified. IMPRESSION: 1. There is extensive irregular bilateral ground-glass airspace opacity, most conspicuous in the right upper lobe. These findings are new compared to prior CT dated 09/29/2018 and most consistent with multifocal infection. 2. There is mild underlying tubular bronchiectasis throughout. 3. Small bilateral pleural effusions and associated atelectasis or consolidation. 4. Coronary artery disease.  Aortic Atherosclerosis (ICD10-I70.0). Electronically Signed   By: Eddie Candle M.D.   On: 07/31/2019  15:32   DG CHEST PORT 1 VIEW  Result Date: 07/30/2019 CLINICAL DATA:  Shortness of breath.  Rapid response. EXAM: PORTABLE CHEST 1 VIEW COMPARISON:  Radiograph earlier this day. FINDINGS: Unchanged heart size and mediastinal contours. Aortic atherosclerosis. Loop recorder projects over the left chest wall. Generalized pulmonary edema with slight improvement, however there is worsening and right suprahilar patchy opacity. No pneumothorax or large pleural effusion. Bones are diffusely under mineralized without acute osseous abnormality. IMPRESSION: Slight improvement in background pulmonary edema from earlier this day, however there is worsening right suprahilar patchy opacity, may be developing asymmetric pulmonary edema or pneumonia. Given relative rapid progression, aspiration is considered. Recommend correlation for aspiration risk factors. Electronically Signed   By: Keith Rake M.D.   On: 07/30/2019 22:11   DG CHEST PORT 1 VIEW  Result Date: 07/30/2019 CLINICAL DATA:  Heart failure. EXAM: PORTABLE CHEST 1 VIEW COMPARISON:  Chest x-ray dated July 27, 2019. FINDINGS: Stable cardiomegaly. Diffuse interstitial thickening with more patchy airspace opacities in the right upper lobe are unchanged. No pneumothorax or large pleural effusion. No acute osseous abnormality. Loop recorder again noted. IMPRESSION: 1. Unchanged mild pulmonary edema. Electronically Signed   By: Titus Dubin M.D.   On: 07/30/2019 08:12   DG CHEST PORT 1 VIEW  Result Date: 07/27/2019 CLINICAL DATA:  Patient admitted 07/24/2019 with shortness of breath. EXAM: PORTABLE CHEST 1 VIEW COMPARISON:  CT chest 09/29/2018. Single-view of the  chest 07/24/2019 and 07/26/2019. FINDINGS: Loop recorder again noted. Cardiomegaly is also again seen. Trace bilateral pleural effusions. Aeration in the right chest continues to improved. The left lung is clear. Atherosclerosis noted. IMPRESSION: Continued improvement in aeration in the right chest.  Trace bilateral pleural effusions. Cardiomegaly. Aortic Atherosclerosis (ICD10-I70.0). Electronically Signed   By: Inge Rise M.D.   On: 07/27/2019 09:21   DG CHEST PORT 1 VIEW  Result Date: 07/26/2019 CLINICAL DATA:  Pulmonary edema EXAM: PORTABLE CHEST 1 VIEW COMPARISON:  Jul 24, 2019 FINDINGS: Again noted is cardiomegaly. There is slight interval improvement in the fluffy airspace opacities and interstitial opacities throughout both lungs, right greater than left. No pleural effusion. No acute osseous abnormality. IMPRESSION: Slight interval improvement in the diffuse pulmonary edema, right greater than left. Electronically Signed   By: Prudencio Pair M.D.   On: 07/26/2019 05:05   ECHOCARDIOGRAM COMPLETE  Result Date: 07/28/2019    ECHOCARDIOGRAM REPORT   Patient Name:   Amy Wiggins Date of Exam: 07/28/2019 Medical Rec #:  [id number]         Height:       64.0 in Accession #:    [id number]        Weight:       192.9 lb Date of Birth:  [birth year]        BSA:          1.927 m Patient Age:    49 years          BP:           110/56 mmHg Patient Gender: F                 HR:           93 bpm. Exam Location:  [facility] Procedure: 2D Echo Indications:    CHF-Acute Diastolic 800.34 / J17.91  History:        Patient has prior history of Echocardiogram examinations, most                 recent 09/29/2018. Stroke, Arrythmias:Atrial Fibrillation; Risk                 Factors:Diabetes, Dyslipidemia, Hypertension and Non-Smoker. PFO                 (patent foramen ovale.  Sonographer:    Leavy Cella RDCS (AE) Referring Phys: [phone] Jansel Vonstein IMPRESSIONS  1. Left ventricular ejection fraction, by estimation, is 60 to 65%. The left ventricle has normal function. The left ventricle has no regional wall motion abnormalities. There is moderate left ventricular hypertrophy. Left ventricular diastolic parameters are consistent with Grade II diastolic dysfunction (pseudonormalization). Elevated left atrial pressure.   2. Right ventricular systolic function is normal. The right ventricular size is normal.  3. Left atrial size was severely dilated.  4. The MV itself is poorly visualized, unable to corroborate elevated gradient morphologically. Consider TEE if clinically indicated. . The mitral valve was not well visualized. Trivial mitral valve regurgitation. Moderate to severe mitral stenosis by mean gradient. Mean gradient 10 mmHg, HR 85  5. The aortic valve is tricuspid. Aortic valve regurgitation is not visualized. No aortic stenosis is present. FINDINGS  Left Ventricle: Left ventricular ejection fraction, by estimation, is 60 to 65%. The left ventricle has normal function. The left ventricle has no regional wall motion abnormalities. The left ventricular internal cavity size was normal in size. There is  moderate left ventricular hypertrophy. Left ventricular  diastolic parameters are consistent with Grade II diastolic dysfunction (pseudonormalization). Elevated left atrial pressure. Right Ventricle: The right ventricular size is normal. No increase in right ventricular wall thickness. Right ventricular systolic function is normal. Left Atrium: Left atrial size was severely dilated. Right Atrium: Right atrial size was not well visualized. Pericardium: There is no evidence of pericardial effusion. Mitral Valve: The MV itself is poorly visualized, unable to corroborate elevated gradient morphologically. Consider TEE if clinically indicated. The mitral valve was not well visualized. There is moderate thickening of the mitral valve leaflet(s). There is moderate calcification of the mitral valve leaflet(s). Moderate mitral annular calcification. Trivial mitral valve regurgitation. Moderate to severe mitral stenosis by mean gradient. Mean gradient 10 mmHg, HR 85 mitral valve stenosis. MV peak gradient, 15.1 mmHg. The mean mitral valve gradient is 10.0 mmHg. Tricuspid Valve: The tricuspid valve is not well visualized. Tricuspid valve  regurgitation is not demonstrated. No evidence of tricuspid stenosis. Aortic Valve: The aortic valve is tricuspid. . There is moderate thickening and moderate calcification of the aortic valve. Aortic valve regurgitation is not visualized. No aortic stenosis is present. Moderate aortic valve annular calcification. There is  moderate thickening of the aortic valve. There is moderate calcification of the aortic valve. Aortic valve mean gradient measures 7.3 mmHg. Aortic valve peak gradient measures 15.3 mmHg. Aortic valve area, by VTI measures 2.65 cm. Pulmonic Valve: The pulmonic valve was not well visualized. Pulmonic valve regurgitation is not visualized. No evidence of pulmonic stenosis. Aorta: The aortic root is normal in size and structure. Pulmonary Artery: Indeterminant PASP, inadequate TR jet. Venous: The inferior vena cava was not well visualized. IAS/Shunts: The interatrial septum was not well visualized.  LEFT VENTRICLE PLAX 2D LVIDd:         4.48 cm  Diastology LVIDs:         2.81 cm  LV e' lateral:   6.31 cm/s LV PW:         1.30 cm  LV E/e' lateral: 31.2 LV IVS:        1.25 cm  LV e' medial:    5.11 cm/s LVOT diam:     2.00 cm  LV E/e' medial:  38.6 LV SV:         88 LV SV Index:   46 LVOT Area:     3.14 cm  RIGHT VENTRICLE RV S prime:     14.00 cm/s TAPSE (M-mode): 1.9 cm LEFT ATRIUM             Index       RIGHT ATRIUM           Index LA diam:        4.90 cm 2.54 cm/m  RA Area:     15.20 cm LA Vol (A2C):   59.7 ml 30.99 ml/m RA Volume:   37.50 ml  19.46 ml/m LA Vol (A4C):   93.1 ml 48.32 ml/m LA Biplane Vol: 81.9 ml 42.51 ml/m  AORTIC VALVE AV Area (Vmax):    2.29 cm AV Area (Vmean):   2.73 cm AV Area (VTI):     2.65 cm AV Vmax:           195.46 cm/s AV Vmean:          124.467 cm/s AV VTI:            0.333 m AV Peak Grad:      15.3 mmHg AV Mean Grad:      7.3 mmHg LVOT  Vmax:         142.44 cm/s LVOT Vmean:        107.981 cm/s LVOT VTI:          0.281 m LVOT/AV VTI ratio: 0.84  AORTA Ao Root  diam: 3.00 cm MITRAL VALVE MV Area (PHT): 2.07 cm     SHUNTS MV Peak grad:  15.1 mmHg    Systemic VTI:  0.28 m MV Mean grad:  10.0 mmHg    Systemic Diam: 2.00 cm MV Vmax:       1.94 m/s MV Vmean:      152.0 cm/s MV Decel Time: 367 msec MR Peak grad: 62.4 mmHg MR Vmax:      395.00 cm/s MV E velocity: 197.00 cm/s MV A velocity: 188.00 cm/s MV E/A ratio:  1.05 Carlyle Dolly MD Electronically signed by Carlyle Dolly MD Signature Date/Time: 07/28/2019/2:02:44 PM    Final      Microbiology: Recent Results (from the past 240 hour(s))  SARS Coronavirus 2 by RT PCR (hospital order, performed in [facility] hospital lab) Nasopharyngeal Nasopharyngeal Swab     Status: None   Collection Time: 07/24/19  8:31 PM   Specimen: Nasopharyngeal Swab  Result Value Ref Range Status   SARS Coronavirus 2 NEGATIVE NEGATIVE Final    Comment: (NOTE) SARS-CoV-2 target nucleic acids are NOT DETECTED. The SARS-CoV-2 RNA is generally detectable in upper and lower respiratory specimens during the acute phase of infection. The lowest concentration of SARS-CoV-2 viral copies this assay can detect is 250 copies / mL. A negative result does not preclude SARS-CoV-2 infection and should not be used as the sole basis for treatment or other patient management decisions.  A negative result may occur with improper specimen collection / handling, submission of specimen other than nasopharyngeal swab, presence of viral mutation(s) within the areas targeted by this assay, and inadequate number of viral copies (<250 copies / mL). A negative result must be combined with clinical observations, patient history, and epidemiological information. Fact Sheet for Patients:   StrictlyIdeas.no Fact Sheet for Healthcare Providers: BankingDealers.co.za This test is not yet approved or cleared  by the [place] FDA and has been authorized for detection and/or diagnosis of SARS-CoV-2 by FDA  under an Emergency Use Authorization (EUA).  This EUA will remain in effect (meaning this test can be used) for the duration of the COVID-19 declaration under Section 564(b)(1) of the Act, 21 U.S.C. section 360bbb-3(b)(1), unless the authorization is terminated or revoked sooner. Performed at [facility], [address] [place], [place] [address]   MRSA PCR Screening     Status: Abnormal   Collection Time: 07/31/19  2:31 AM   Specimen: Nasal Mucosa; Nasopharyngeal  Result Value Ref Range Status   MRSA by PCR POSITIVE (A) NEGATIVE Final    Comment:        The GeneXpert MRSA Assay (FDA approved for NASAL specimens only), is one component of a comprehensive MRSA colonization surveillance program. It is not intended to diagnose MRSA infection nor to guide or monitor treatment for MRSA infections. RESULT CALLED TO, READ BACK BY AND VERIFIED WITH: MURPHY @ 0909 ON [id number] BY HENDERSON L. Performed at [facility], [address], [place], [place] [address]      Labs: Basic Metabolic Panel: Recent Labs  Lab 07/30/19 [phone] 07/30/19 3267 07/31/19 0405 07/31/19 0405 07/31/19 2305 08/01/19 0259 08/01/19 0259 08/02/19 0338 08/03/19 0607  NA 135  --  135  --   --  133*  --  133* 134*  K 3.6   < > 2.7*   < >  --  4.8   < > 4.9 4.4  CL 94*  --  93*  --   --  94*  --  94* 93*  CO2 29  --  31  --   --  32  --  29 31  GLUCOSE 154*   < > 199*  --  395* 277*  --  260* 237*  BUN 46*  --  51*  --   --  46*  --  50* 51*  CREATININE 2.42*  --  2.55*  --   --  2.73*  --  2.84* 2.81*  CALCIUM 9.1  --  9.0  --   --  9.0  --  9.2 9.1  MG 1.8  --  1.7  --   --  2.5*  --   --  2.2   < > = values in this interval not displayed.   Liver Function Tests: No results for input(s): AST, ALT, ALKPHOS, BILITOT, PROT, ALBUMIN in the last 168 hours. No results for input(s): LIPASE, AMYLASE in the last 168 hours. No results for input(s): AMMONIA in the last 168 hours. CBC: Recent Labs  Lab  07/29/19 0624 07/30/19 0521 07/31/19 0405 08/01/19 0259 08/02/19 0338  WBC 7.7 8.3 8.3 9.2 9.7  HGB 7.6* 7.8* 7.3* 7.2* 7.5*  HCT 26.6* 26.8* 25.3* 25.1* 26.4*  MCV 88.4 87.3 86.9 86.6 89.2  PLT 409* 437* 418* 455* 467*   Cardiac Enzymes: No results for input(s): CKTOTAL, CKMB, CKMBINDEX, TROPONINI in the last 168 hours. BNP: Invalid input(s): POCBNP CBG: Recent Labs  Lab 08/02/19 1557 08/02/19 2047 08/03/19 0006 08/03/19 0341 08/03/19 0713  GLUCAP 324* 396* 292* 239* 227*    Time coordinating discharge:  36 minutes  Signed:  Orson Eva, DO [facility] Pager: [phone] 08/03/2019, 10:44 AM

## 2019-08-03 ENCOUNTER — Inpatient Hospital Stay
Admission: RE | Admit: 2019-08-03 | Discharge: 2019-08-27 | Disposition: A | Discharge status: Home / Self Care | Payer: Medicare HMO | Source: Ambulatory Visit | Attending: Internal Medicine | Admitting: Internal Medicine

## 2019-08-03 DIAGNOSIS — E1122 Type 2 diabetes mellitus with diabetic chronic kidney disease: Secondary | ICD-10-CM | POA: Diagnosis not present

## 2019-08-03 DIAGNOSIS — I5032 Chronic diastolic (congestive) heart failure: Secondary | ICD-10-CM | POA: Diagnosis not present

## 2019-08-03 DIAGNOSIS — I482 Chronic atrial fibrillation, unspecified: Secondary | ICD-10-CM | POA: Diagnosis not present

## 2019-08-03 DIAGNOSIS — E1169 Type 2 diabetes mellitus with other specified complication: Secondary | ICD-10-CM | POA: Diagnosis not present

## 2019-08-03 DIAGNOSIS — I129 Hypertensive chronic kidney disease with stage 1 through stage 4 chronic kidney disease, or unspecified chronic kidney disease: Secondary | ICD-10-CM | POA: Diagnosis not present

## 2019-08-03 DIAGNOSIS — E782 Mixed hyperlipidemia: Secondary | ICD-10-CM | POA: Diagnosis not present

## 2019-08-03 DIAGNOSIS — I63431 Cerebral infarction due to embolism of right posterior cerebral artery: Secondary | ICD-10-CM | POA: Diagnosis not present

## 2019-08-03 DIAGNOSIS — Z794 Long term (current) use of insulin: Secondary | ICD-10-CM | POA: Diagnosis not present

## 2019-08-03 DIAGNOSIS — Z7189 Other specified counseling: Secondary | ICD-10-CM | POA: Diagnosis not present

## 2019-08-03 DIAGNOSIS — Z515 Encounter for palliative care: Secondary | ICD-10-CM | POA: Diagnosis not present

## 2019-08-03 DIAGNOSIS — I480 Paroxysmal atrial fibrillation: Secondary | ICD-10-CM | POA: Diagnosis not present

## 2019-08-03 DIAGNOSIS — R42 Dizziness and giddiness: Secondary | ICD-10-CM | POA: Diagnosis not present

## 2019-08-03 DIAGNOSIS — F339 Major depressive disorder, recurrent, unspecified: Secondary | ICD-10-CM | POA: Diagnosis not present

## 2019-08-03 DIAGNOSIS — I10 Essential (primary) hypertension: Secondary | ICD-10-CM | POA: Diagnosis not present

## 2019-08-03 DIAGNOSIS — J9621 Acute and chronic respiratory failure with hypoxia: Secondary | ICD-10-CM | POA: Diagnosis not present

## 2019-08-03 DIAGNOSIS — I1310 Hypertensive heart and chronic kidney disease without heart failure, with stage 1 through stage 4 chronic kidney disease, or unspecified chronic kidney disease: Secondary | ICD-10-CM | POA: Diagnosis not present

## 2019-08-03 DIAGNOSIS — J189 Pneumonia, unspecified organism: Secondary | ICD-10-CM | POA: Diagnosis not present

## 2019-08-03 DIAGNOSIS — J9811 Atelectasis: Secondary | ICD-10-CM | POA: Diagnosis not present

## 2019-08-03 DIAGNOSIS — J90 Pleural effusion, not elsewhere classified: Secondary | ICD-10-CM | POA: Diagnosis not present

## 2019-08-03 DIAGNOSIS — K219 Gastro-esophageal reflux disease without esophagitis: Secondary | ICD-10-CM | POA: Diagnosis not present

## 2019-08-03 DIAGNOSIS — J9601 Acute respiratory failure with hypoxia: Secondary | ICD-10-CM | POA: Diagnosis not present

## 2019-08-03 DIAGNOSIS — K5909 Other constipation: Secondary | ICD-10-CM | POA: Diagnosis not present

## 2019-08-03 DIAGNOSIS — E1159 Type 2 diabetes mellitus with other circulatory complications: Secondary | ICD-10-CM | POA: Diagnosis not present

## 2019-08-03 DIAGNOSIS — N184 Chronic kidney disease, stage 4 (severe): Secondary | ICD-10-CM | POA: Diagnosis not present

## 2019-08-03 DIAGNOSIS — J479 Bronchiectasis, uncomplicated: Secondary | ICD-10-CM | POA: Diagnosis not present

## 2019-08-03 DIAGNOSIS — E785 Hyperlipidemia, unspecified: Secondary | ICD-10-CM | POA: Diagnosis not present

## 2019-08-03 DIAGNOSIS — R5383 Other fatigue: Secondary | ICD-10-CM | POA: Diagnosis not present

## 2019-08-03 DIAGNOSIS — I5033 Acute on chronic diastolic (congestive) heart failure: Secondary | ICD-10-CM | POA: Diagnosis not present

## 2019-08-03 LAB — BASIC METABOLIC PANEL
Anion gap: 10 (ref 5–15)
BUN: 51 mg/dL — ABNORMAL HIGH (ref 8–23)
CO2: 31 mmol/L (ref 22–32)
Calcium: 9.1 mg/dL (ref 8.9–10.3)
Chloride: 93 mmol/L — ABNORMAL LOW (ref 98–111)
Creatinine, Ser: 2.81 mg/dL — ABNORMAL HIGH (ref 0.44–1.00)
GFR calc Af Amer: 17 mL/min — ABNORMAL LOW (ref >60)
GFR calc non Af Amer: 15 mL/min — ABNORMAL LOW (ref >60)
Glucose, Bld: 237 mg/dL — ABNORMAL HIGH (ref 70–99)
Potassium: 4.4 mmol/L (ref 3.5–5.1)
Sodium: 134 mmol/L — ABNORMAL LOW (ref 135–145)

## 2019-08-03 LAB — GLUCOSE, CAPILLARY
Glucose-Capillary: 227 mg/dL — ABNORMAL HIGH (ref 70–99)
Glucose-Capillary: 239 mg/dL — ABNORMAL HIGH (ref 70–99)
Glucose-Capillary: 292 mg/dL — ABNORMAL HIGH (ref 70–99)
Glucose-Capillary: 293 mg/dL — ABNORMAL HIGH (ref 70–99)

## 2019-08-03 LAB — MAGNESIUM: Magnesium: 2.2 mg/dL (ref 1.7–2.4)

## 2019-08-03 LAB — SARS CORONAVIRUS 2 BY RT PCR (HOSPITAL ORDER, PERFORMED IN ~~LOC~~ HOSPITAL LAB): SARS Coronavirus 2: NEGATIVE

## 2019-08-03 MED ORDER — DILTIAZEM HCL ER COATED BEADS 120 MG PO CP24: 120.0000 mg | ORAL_CAPSULE | Freq: Every day | ORAL | Status: DC

## 2019-08-03 MED ORDER — INSULIN GLARGINE 100 UNIT/ML ~~LOC~~ SOLN
18.0000 [IU] | Freq: Every day | SUBCUTANEOUS | Status: DC
  Administered 2019-08-03: 18 [IU] via SUBCUTANEOUS
  Filled 2019-08-03 (×3): qty 0.18

## 2019-08-03 MED ORDER — AMOXICILLIN-POT CLAVULANATE 500-125 MG PO TABS: 1.0000 | ORAL_TABLET | Freq: Two times a day (BID) | ORAL | 0 refills | Status: DC

## 2019-08-03 MED ORDER — IPRATROPIUM-ALBUTEROL 0.5-2.5 (3) MG/3ML IN SOLN
3.0000 mL | Freq: Four times a day (QID) | RESPIRATORY_TRACT | Status: DC
  Administered 2019-08-03: 3 mL via RESPIRATORY_TRACT
  Filled 2019-08-03: qty 3

## 2019-08-03 MED ORDER — INSULIN GLARGINE 100 UNIT/ML ~~LOC~~ SOLN: 20.0000 [IU] | Freq: Every day | SUBCUTANEOUS | Status: DC

## 2019-08-03 MED ORDER — AMOXICILLIN-POT CLAVULANATE 500-125 MG PO TABS
1.0000 | ORAL_TABLET | Freq: Two times a day (BID) | ORAL | Status: DC
  Administered 2019-08-03: 500 mg via ORAL
  Filled 2019-08-03: qty 1

## 2019-08-03 MED ORDER — INSULIN GLARGINE 100 UNIT/ML ~~LOC~~ SOLN: 20.0000 [IU] | Freq: Every day | SUBCUTANEOUS | 11 refills | Status: DC

## 2019-08-03 NOTE — Progress Notes (Signed)
IV and tele removed. Report called to [facility] and given to [facility]. Patient to be transported to [facility] via wheelchair. Will continue to monitor.

## 2019-08-03 NOTE — Progress Notes (Signed)
Physical Therapy Treatment Patient Details Name: Amy Wiggins MRN: [id number] DOB: 1931-06-30 Today's Date: 08/03/2019    History of Present Illness Amy Wiggins  is a 86 y.o. female, with history of paroxysmal atrial fibrillation on chronic anticoagulation with Eliquis, HFpEF, stroke, hypertension, hyperlipidemia, diabetes mellitus type 2, depression, CKD stage IV came to ED with complaints of shortness of breath.  Patient says that her Lasix was stopped about 2 weeks ago by cardiologist PA.  Patient says that since that time she has experienced worsening shortness of breath.  She denies fever or chills.  Denies coughing up any phlegm.  Denies chest pain.  Denies nausea vomiting or diarrhea.  Denies abdominal pain or dysuria.    PT Comments    Pt attempted therapeutic exercise on RA, but desat to 88% and required return of 2L to improve saturation to 93% and greater. Pt able to perform exercises with cues for form and therapeutic rest breaks. Initial STS rep from EOB requiring min assist to power up, but able to perform STS reps with min guard assist once cued for glute activation and pushing with BUE to assist in powering up. Pt fatigues with exercises and mobility. Ambulation limited to steps at bedside with RW due to fatigue from exercises. Patient will benefit from continued physical therapy in hospital and recommendations below to increase strength, balance, endurance for safe ADLs and gait.    Follow Up Recommendations  Supervision for mobility/OOB;SNF;Supervision/Assistance - 24 hour     Equipment Recommendations  None recommended by PT    Recommendations for Other Services       Precautions / Restrictions Precautions Precautions: Fall Restrictions Weight Bearing Restrictions: No    Mobility  Bed Mobility Overal bed mobility: Needs Assistance Bed Mobility: Supine to Sit;Sit to Supine  Supine to sit: Min guard;HOB elevated Sit to supine: Min guard  General bed  mobility comments: slow labored movement, increased time with use of bedrail and elevated HOB to come to sitting at EOB; min guard assist to return to supine, slow movement  Transfers Overall transfer level: Needs assistance Equipment used: Rolling walker (2 wheeled) Transfers: Sit to/from Stand Sit to Stand: Min assist;Min guard  General transfer comment: initial transfer requiring min assist to power up from EOB; performed STS x5reps for exercise from EOB with min guard assist, cues to push through bil flat feet, activate glutes, and powerup with BUE assisting  Ambulation/Gait Ambulation/Gait assistance: Min guard  Assistive device: Rolling walker (2 wheeled) Gait Pattern/deviations: Step-through pattern;Decreased stride length Gait velocity: decreased  General Gait Details: slow, labored steps at bedside, limited 2* fatigue with exercises, good steadiness with RW   Stairs             Wheelchair Mobility    Modified Rankin (Stroke Patients Only)       Balance Overall balance assessment: Needs assistance Sitting-balance support: Feet supported Sitting balance-Leahy Scale: Good Sitting balance - Comments: seated EOB   Standing balance support: Bilateral upper extremity supported;During functional activity Standing balance-Leahy Scale: Fair Standing balance comment: with RW       Cognition Arousal/Alertness: Awake/alert Behavior During Therapy: WFL for tasks assessed/performed Overall Cognitive Status: Within Functional Limits for tasks assessed     Exercises General Exercises - Lower Extremity Ankle Circles/Pumps: Supine;Strengthening;Both;15 reps Quad Sets: Supine;Strengthening;Both;15 reps Long Arc Quad: Seated;Strengthening;Both;15 reps Hip ABduction/ADduction: Supine;Strengthening;Both;10 reps Hip Flexion/Marching: Seated;Strengthening;Both;15 reps    General Comments General comments (skin integrity, edema, etc.): on 2L >93%, tried exercises on RA with  SpO2 desat to 88% and unable to rebound with cues for pursed lip breathing so returned 2L O2 and SpO2 93% within 1 minute      Pertinent Vitals/Pain Pain Assessment: No/denies pain    Home Living                      Prior Function            PT Goals (current goals can now be found in the care plan section) Acute Rehab PT Goals Patient Stated Goal: Return home PT Goal Formulation: With patient/family Time For Goal Achievement: 08/09/19 Potential to Achieve Goals: Good Progress towards PT goals: Progressing toward goals    Frequency    Min 3X/week      PT Plan Current plan remains appropriate    Co-evaluation              AM-PAC PT "6 Clicks" Mobility   Outcome Measure  Help needed turning from your back to your side while in a flat bed without using bedrails?: A Little Help needed moving from lying on your back to sitting on the side of a flat bed without using bedrails?: A Little Help needed moving to and from a bed to a chair (including a wheelchair)?: A Little Help needed standing up from a chair using your arms (e.g., wheelchair or bedside chair)?: A Little Help needed to walk in hospital room?: A Little Help needed climbing 3-5 steps with a railing? : A Lot 6 Click Score: 17    End of Session Equipment Utilized During Treatment: Oxygen Activity Tolerance: Patient tolerated treatment well Patient left: in bed;with call bell/phone within reach;with bed alarm set Nurse Communication: Mobility status PT Visit Diagnosis: Unsteadiness on feet (R26.81);Other abnormalities of gait and mobility (R26.89);Muscle weakness (generalized) (M62.81)     Time: 6578-4696 PT Time Calculation (min) (ACUTE ONLY): 22 min  Charges:  $Therapeutic Exercise: 8-22 mins                      Tori Ivylynn Hoppes PT, DPT 08/03/19, 12:00 PM [phone]

## 2019-08-03 NOTE — NC FL2 (Signed)
[place] LEVEL OF CARE SCREENING TOOL     IDENTIFICATION  Patient Name: Amy Wiggins Birthdate: 1932-01-21 Sex: female Admission Date (Current Location): 07/24/2019  [facility] and [place] Number:  [employer] and Address:  [place] [address], [place]      Provider Number: [id number]  Attending Physician Name and Address:  No att. providers found  Relative Name and Phone Number:       Current Level of Care: Hospital Recommended Level of Care: [place] Prior Approval Number:    Date Approved/Denied:   PASRR Number: pending  Discharge Plan: SNF    Current Diagnoses: Patient Active Problem List   Diagnosis Date Noted   Goals of care, counseling/discussion    Palliative care by specialist    Acute on chronic diastolic (congestive) heart failure ([place]) 07/24/2019   Acute on chronic congestive heart failure (HCC)    Acute diastolic CHF (congestive heart failure) ([place]) 09/29/2018   Acute respiratory failure with hypoxia ([place]) 09/29/2018   Atrial fibrillation with RVR (HCC)    Hypotension 09/28/2018   Chronic a-fib with RVR 09/28/2018   Cerebellar infarct ([place])    Stroke ([place]) 02/22/2016   Ischemic stroke ([place]) 02/22/2016   HFpEF/Chronic diastolic congestive heart failure ([place]) 02/22/2016   AKI (acute kidney injury) ([place]) 02/22/2016   Diabetes mellitus with complication (HCC)    Shortness of breath 01/14/2016   Wheezing 01/14/2016   Paroxysmal atrial fibrillation ([place]) 11/14/2015   Cerebrovascular accident (CVA) due to embolism of right posterior cerebral artery ([place]) 08/08/2015   Essential hypertension 08/08/2015   HLD (hyperlipidemia) 08/08/2015   Type 2 diabetes mellitus with circulatory disorder ([place]) 08/08/2015   PFO (patent foramen ovale)    ARF (acute renal failure) ([place]) 06/04/2015   Cerebral infarction due to unspecified mechanism    Acute CVA  (cerebrovascular accident) ([place]) 06/02/2015   Hypertension 06/02/2015   Hyperlipidemia 06/02/2015   CKD (chronic kidney disease) stage 4, GFR 15-29 ml/min ([place]) 06/02/2015   DM type 2 (diabetes mellitus, type 2) ([place]) 06/02/2015   Leukocytosis 06/02/2015   Muscle weakness (generalized) 09/16/2012   Pain in joint, shoulder region 09/16/2012   Closed displaced fracture of lateral end of clavicle 08/18/2012    Orientation RESPIRATION BLADDER Height & Weight     Self, Situation, Place  O2(2L) Continent Weight: 195 lb 15.8 oz (88.9 kg) Height:  5\' 4"  (162.6 cm)  BEHAVIORAL SYMPTOMS/MOOD NEUROLOGICAL BOWEL NUTRITION STATUS      Continent Diet(heart healthy/carb modified)  AMBULATORY STATUS COMMUNICATION OF NEEDS Skin   Limited Assist Verbally Normal                       Personal Care Assistance Level of Assistance  Bathing, Dressing, Feeding Bathing Assistance: Limited assistance Feeding assistance: Limited assistance Dressing Assistance: Limited assistance     Functional Limitations Info  Sight, Speech, Hearing Sight Info: Adequate Hearing Info: Adequate Speech Info: Adequate    SPECIAL CARE FACTORS FREQUENCY  PT (By licensed PT)     PT Frequency: daily              Contractures      Additional Factors Info  Code Status, Allergies, Psychotropic Code Status Info: Full code Allergies Info: No known allergies Psychotropic Info: Wellbutrin, Celexa, Remeron, Trazodone         Current Medications (08/03/2019):  This is the current hospital active medication list Current Facility-Administered Medications  Medication  Dose Route Frequency Provider Last Rate Last Admin   0.9 %  sodium chloride infusion  250 mL Intravenous PRN Tat, Shanon Brow, MD       acetaminophen (TYLENOL) tablet 650 mg  650 mg Oral Q4H PRN Tat, Shanon Brow, MD   650 mg at 08/01/19 0110   amiodarone (PACERONE) tablet 200 mg  200 mg Oral Daily Tat, David, MD   200 mg at 08/03/19 0929    amoxicillin-clavulanate (AUGMENTIN) 500-125 MG per tablet 500 mg  1 tablet Oral BID Tat, Shanon Brow, MD   500 mg at 08/03/19 1210   apixaban (ELIQUIS) tablet 2.5 mg  2.5 mg Oral BID Tat, Shanon Brow, MD   2.5 mg at 08/03/19 4163   atorvastatin (LIPITOR) tablet 20 mg  20 mg Oral QHS Orson Eva, MD   20 mg at 08/02/19 2350   buPROPion (WELLBUTRIN XL) 24 hr tablet 300 mg  300 mg Oral Daily Tat, Shanon Brow, MD   300 mg at 08/03/19 8453   Chlorhexidine Gluconate Cloth 2 % PADS 6 each  6 each Topical Daily Tat, Shanon Brow, MD   6 each at 08/03/19 0929   citalopram (CELEXA) tablet 20 mg  20 mg Oral Daily Tat, Shanon Brow, MD   20 mg at 08/03/19 6468   diltiazem (CARDIZEM CD) 24 hr capsule 120 mg  120 mg Oral Daily Tat, Shanon Brow, MD   120 mg at 05/15/20 4825   folic acid (FOLVITE) tablet 1 mg  1 mg Oral Daily Tat, David, MD   1 mg at 08/03/19 0929   furosemide (LASIX) tablet 40 mg  40 mg Oral Daily Tat, David, MD   40 mg at 08/03/19 0929   insulin aspart (novoLOG) injection 0-15 Units  0-15 Units Subcutaneous TID WC Orson Eva, MD   8 Units at 08/03/19 1211   insulin aspart (novoLOG) injection 0-5 Units  0-5 Units Subcutaneous QHS Orson Eva, MD   3 Units at 08/03/19 0013   insulin glargine (LANTUS) injection 18 Units  18 Units Subcutaneous Daily Tat, David, MD   18 Units at 08/03/19 1210   ipratropium-albuterol (DUONEB) 0.5-2.5 (3) MG/3ML nebulizer solution 3 mL  3 mL Nebulization Q6H PRN Tat, David, MD   3 mL at 07/30/19 2254   ipratropium-albuterol (DUONEB) 0.5-2.5 (3) MG/3ML nebulizer solution 3 mL  3 mL Nebulization Q6H Tat, Shanon Brow, MD   3 mL at 08/03/19 1438   mupirocin ointment (BACTROBAN) 2 % 1 application  1 application Nasal BID Tat, David, MD   1 application at 00/37/04 0929   ondansetron (ZOFRAN) injection 4 mg  4 mg Intravenous Q6H PRN Tat, David, MD       pantoprazole (PROTONIX) EC tablet 40 mg  40 mg Oral Daily Tat, David, MD   40 mg at 08/03/19 8889   sodium chloride flush (NS) 0.9 % injection 3 mL  3 mL  Intravenous Q12H Tat, Shanon Brow, MD   3 mL at 08/03/19 0929   sodium chloride flush (NS) 0.9 % injection 3 mL  3 mL Intravenous PRN Tat, Shanon Brow, MD       traZODone (DESYREL) tablet 150 mg  150 mg Oral Benay Pike, MD   150 mg at 08/02/19 2349   Current Outpatient Medications  Medication Sig Dispense Refill   acetaminophen (TYLENOL) 500 MG tablet Take 1,000 mg by mouth at bedtime. Take 2 tablets (1000 mg) by mouth daily at bedtime, may also take 2 tablets during the day as needed for pain     amiodarone (PACERONE) 200  MG tablet Take 1 tablet (200 mg total) by mouth daily. 90 tablet 3   atorvastatin (LIPITOR) 20 MG tablet Take 2 tablets (40 mg total) by mouth every evening. 4pm (Patient taking differently: Take 20 mg by mouth at bedtime. ) 60 tablet 0   buPROPion (WELLBUTRIN XL) 300 MG 24 hr tablet Take 300 mg by mouth daily.     citalopram (CELEXA) 20 MG tablet Take 20 mg by mouth daily.      ELIQUIS 2.5 MG TABS tablet Take 2.5 mg by mouth 2 (two) times daily.      furosemide (LASIX) 40 MG tablet Take 1 tablet (40 mg total) by mouth daily. 30 tablet 0   Ginkgo Biloba 120 MG CAPS Take 120 mg by mouth daily.     Magnesium Oxide 250 MG TABS Take 500 mg by mouth every evening. 500mg  tablets on hand     Multiple Vitamin (MULTIVITAMIN WITH MINERALS) TABS tablet Take 1 tablet by mouth at bedtime. For supplement     Multiple Vitamins-Minerals (PRESERVISION AREDS 2) CAPS Take 1 capsule by mouth 2 (two) times daily.     pantoprazole (PROTONIX) 40 MG tablet Take 40 mg by mouth daily. For acid reflux/gerd     amoxicillin-clavulanate (AUGMENTIN) 500-125 MG tablet Take 1 tablet (500 mg total) by mouth 2 (two) times daily. X 4 more days 8 tablet 0   [START ON 08/04/2019] diltiazem (CARDIZEM CD) 120 MG 24 hr capsule Take 1 capsule (120 mg total) by mouth daily.     insulin glargine (LANTUS) 100 UNIT/ML injection Inject 0.2 mLs (20 Units total) into the skin daily. 10 mL 11   traZODone (DESYREL) 100  MG tablet Take 150 mg by mouth at bedtime.        Discharge Medications: Please see discharge summary for a list of discharge medications.  Relevant Imaging Results:  Relevant Lab Results:   Additional Information SSN: [id number]. Granddaughter states pt has had COVID vaccines.  Natasha Bence, LCSW

## 2019-08-03 NOTE — TOC Transition Note (Signed)
Transition of Care [facility]) - CM/SW Discharge Note   Patient Details  Name: Amy Wiggins MRN: [id number] Date of Birth: 04-30-1931  [facility]) CM/SW Contact:  Natasha Bence, LCSW Phone Number: 08/03/2019, 3:53 PM   Clinical Narrative:   Patient is being discharged with SNF placement at [facility]. Kerri with [facility] has been notified of negative Covid test and is agreeable to pick up. Patent referred to [facility] and is to receive service while at [facility].    Final next level of care: Skilled Nursing Facility Barriers to Discharge: No Barriers Identified   Patient Goals and CMS Choice Patient states their goals for this hospitalization and ongoing recovery are:: Rehab at [facility] CMS Medicare.gov Compare Post Acute Care list provided to:: Patient Choice offered to / list presented to : Amy Wiggins (granddaughter))  Discharge Placement PASRR number recieved: 08/03/19            Patient chooses bed at: [facility] Patient to be transferred to facility by: [facility] Name of family member notified: Amy Wiggins Patient and family notified of of transfer: 08/03/19  Discharge Plan and Services     Post Acute Care Choice: Home Health              Readmission Risk Interventions No flowsheet data found.

## 2019-08-03 NOTE — Progress Notes (Signed)
Initial Nutrition Assessment  DOCUMENTATION CODES:   Obesity unspecified  INTERVENTION:  Glucerna Shake po BID, each supplement provides 220 kcal and 10 grams of protein (chocolate)  Unable to provide at this time, medications have been reconciled for discharge   NUTRITION DIAGNOSIS:   Inadequate oral intake related to acute illness, chronic illness, decreased appetite(acute on chronic diastolic CHF) as evidenced by per patient/family report(patient eating 66% average of meals on HH/CM diet insufficient to meet estimated needs).  GOAL:   Patient will meet greater than or equal to 90% of their needs    MONITOR:   PO intake, Supplement acceptance, Weight trends, Labs, I & O's  REASON FOR ASSESSMENT:   LOS    ASSESSMENT:  86 year old female with past medical history significant of atrial fibrillation, HFpEF, stroke, HTN, HLD, DM2, depression, CKD stage IV presented with SOB, CXR showed pulmonary edema. Patient admitted for acute on chronic diastolic CHF.  Patient awake, laying in bed under multiple blankets this afternoon. Lunch tray on bedside table, observed 75% po intake. Patient eating 50-100% (66% average) of the last 8 documented meals per flowsheets. Patient reports poor po intake during admission secondary to dislike of meals and not feeling well, amenable to chocolate Glucerna to aid with meeting needs. She reports usually having a good appetite at home, recalls eating toast with eggs for breakfast, a sandwich for lunch, and dinner prepared by her grand-daughter, recalls really enjoying her homemade stew.   I/Os: -6193 ml since admit        +36 ml x 24 hrs UOP: 650 ml x 24 hrs  Current wt 195.58 lb, weights up 7 lb during admission. Patient unsure of UBW, denies recent weight changes.  Medications reviewed and include: Folvite, Lasix, SSI, Lantus 18 units daily, Protonix  Labs: CBGs 293,227,239,396,324,362 x 24 hrs, Na 134 (L), BUN 51 (H), Cr 2.81 (H), BNP 137  (H)  NUTRITION - FOCUSED PHYSICAL EXAM: Mild orbital fat depletion  Diet Order:   Diet Order            Diet heart healthy/carb modified Room service appropriate? Yes; Fluid consistency: Thin  Diet effective now              EDUCATION NEEDS:   No education needs have been identified at this time  Skin:  Skin Assessment: Reviewed RN Assessment  Last BM:  6/05  Height:   Ht Readings from Last 1 Encounters:  08/02/19 5\' 4"  (1.626 m)    Weight:   Wt Readings from Last 1 Encounters:  08/03/19 88.9 kg    BMI:  Body mass index is 33.64 kg/m.  Estimated Nutritional Needs:   Kcal:  6045-4098  Protein:  80-93  Fluid:  >/= 1.7 L.day    Lajuan Lines, RD, LDN Clinical Nutrition After Hours/Weekend Pager # in [place]

## 2019-08-04 ENCOUNTER — Non-Acute Institutional Stay (SKILLED_NURSING_FACILITY): Payer: Medicare HMO | Admitting: Adult Health

## 2019-08-04 ENCOUNTER — Encounter: Payer: Self-pay | Admitting: Adult Health

## 2019-08-04 DIAGNOSIS — K5909 Other constipation: Secondary | ICD-10-CM | POA: Diagnosis not present

## 2019-08-04 DIAGNOSIS — N184 Chronic kidney disease, stage 4 (severe): Secondary | ICD-10-CM

## 2019-08-04 DIAGNOSIS — E1169 Type 2 diabetes mellitus with other specified complication: Secondary | ICD-10-CM | POA: Diagnosis not present

## 2019-08-04 DIAGNOSIS — I63431 Cerebral infarction due to embolism of right posterior cerebral artery: Secondary | ICD-10-CM | POA: Diagnosis not present

## 2019-08-04 DIAGNOSIS — J9601 Acute respiratory failure with hypoxia: Secondary | ICD-10-CM | POA: Diagnosis not present

## 2019-08-04 DIAGNOSIS — E1159 Type 2 diabetes mellitus with other circulatory complications: Secondary | ICD-10-CM

## 2019-08-04 DIAGNOSIS — R918 Other nonspecific abnormal finding of lung field: Secondary | ICD-10-CM

## 2019-08-04 DIAGNOSIS — I482 Chronic atrial fibrillation, unspecified: Secondary | ICD-10-CM

## 2019-08-04 DIAGNOSIS — Z794 Long term (current) use of insulin: Secondary | ICD-10-CM

## 2019-08-04 DIAGNOSIS — K219 Gastro-esophageal reflux disease without esophagitis: Secondary | ICD-10-CM | POA: Diagnosis not present

## 2019-08-04 DIAGNOSIS — F339 Major depressive disorder, recurrent, unspecified: Secondary | ICD-10-CM | POA: Insufficient documentation

## 2019-08-04 DIAGNOSIS — E1122 Type 2 diabetes mellitus with diabetic chronic kidney disease: Secondary | ICD-10-CM | POA: Diagnosis not present

## 2019-08-04 DIAGNOSIS — I5033 Acute on chronic diastolic (congestive) heart failure: Secondary | ICD-10-CM

## 2019-08-04 DIAGNOSIS — I129 Hypertensive chronic kidney disease with stage 1 through stage 4 chronic kidney disease, or unspecified chronic kidney disease: Secondary | ICD-10-CM

## 2019-08-04 DIAGNOSIS — E785 Hyperlipidemia, unspecified: Secondary | ICD-10-CM

## 2019-08-04 NOTE — Progress Notes (Signed)
Location:    [facility] Room Number: 128/P Place of Service:  SNF (31)   CODE STATUS: DNR  No Known Allergies  Chief Complaint  Patient presents with  . Hospitalization Follow-up        HPI:  She is a 86 year old woman who has been hospitalized from 07-24-19 through 08-03-19. She had been having increased weakness and shortness of breath prior to her hospitalization. Her lasix had been stopped 2 weeks prior to her hospitalization. She was found to be in fluid overload. She was treated for acute respiratory failure due to CHF. She experienced a hypoglycemic episode while in the hospital required D10 and was weaned off. She is here for short term rehab with her goal to return back home. She lives with family; lives on one floor. She will more than likely require a wheelchair upon discharge. She becomes hypoxic with activity with 02; but recovers quickly. She will continue to be followed for her chronic illnesses including: chf; diabetes crd.    Past Medical History:  Diagnosis Date  . Chronic anticoagulation   . Depression   . Diabetes mellitus   . Hyperlipidemia   . Hypertension   . PAF (paroxysmal atrial fibrillation) ([place])   . PFO (patent foramen ovale)   . Stroke ([place])    TIA's x 2    Past Surgical History:  Procedure Laterality Date  . COLONOSCOPY    . COLONOSCOPY N/A 09/09/2014   Procedure: COLONOSCOPY;  Surgeon: Rogene Houston, MD;  Location: [facility];  Service: Endoscopy;  Laterality: N/A;  1010 - moved to 7:30 - Ann to notify  . EP IMPLANTABLE DEVICE N/A 06/07/2015   Procedure: Loop Recorder Insertion;  Surgeon: Deboraha Sprang, MD;  Location: [facility];  Service: Cardiovascular;  Laterality: N/A;  . TEE WITHOUT CARDIOVERSION N/A 06/07/2015   Procedure: TRANSESOPHAGEAL ECHOCARDIOGRAM (TEE);  Surgeon: Thayer Headings, MD;  Location: [facility];  Service: Cardiovascular;  Laterality: N/A;    Social History   Socioeconomic History    . Marital status: Widowed    Spouse name: Not on file  . Number of children: Not on file  . Years of education: Not on file  . Highest education level: Not on file  Occupational History  . Not on file  Tobacco Use  . Smoking status: Never Smoker  . Smokeless tobacco: Never Used  Substance and Sexual Activity  . Alcohol use: No  . Drug use: No  . Sexual activity: Never  Other Topics Concern  . Not on file  Social History Narrative  . Not on file   Social Determinants of Health   Financial Resource Strain:   . Difficulty of Paying Living Expenses:   Food Insecurity:   . Worried About Charity fundraiser in the Last Year:   . Arboriculturist in the Last Year:   Transportation Needs:   . Film/video editor (Medical):   Marland Kitchen Lack of Transportation (Non-Medical):   Physical Activity:   . Days of Exercise per Week:   . Minutes of Exercise per Session:   Stress:   . Feeling of Stress :   Social Connections:   . Frequency of Communication with Friends and Family:   . Frequency of Social Gatherings with Friends and Family:   . Attends Religious Services:   . Active Member of Clubs or Organizations:   . Attends Archivist Meetings:   Marland Kitchen Marital Status:  Intimate Partner Violence:   . Fear of Current or Ex-Partner:   . Emotionally Abused:   Marland Kitchen Physically Abused:   . Sexually Abused:    Family History  Problem Relation Age of Onset  . Stroke Father   . Dementia Sister       VITAL SIGNS BP (!) 119/58   Pulse 75   Temp 97.9 F (36.6 C) (Oral)   Resp 17   Ht 5\' 4"  (1.626 m)   Wt 164 lb 12.8 oz (74.8 kg)   SpO2 93%   BMI 28.29 kg/m   Outpatient Encounter Medications as of 08/04/2019  Medication Sig  . acetaminophen (TYLENOL) 500 MG tablet Take 1,000 mg by mouth at bedtime. may also take 2 tablets during the day as needed for pain  . amiodarone (PACERONE) 200 MG tablet Take 1 tablet (200 mg total) by mouth daily.  Marland Kitchen amoxicillin-clavulanate (AUGMENTIN)  500-125 MG tablet Take 1 tablet (500 mg total) by mouth 2 (two) times daily. X 4 more days  . atorvastatin (LIPITOR) 20 MG tablet Take 2 tablets (40 mg total) by mouth every evening. 4pm  . buPROPion (WELLBUTRIN XL) 300 MG 24 hr tablet Take 300 mg by mouth daily.  . citalopram (CELEXA) 20 MG tablet Take 20 mg by mouth daily.   Marland Kitchen diltiazem (CARDIZEM CD) 120 MG 24 hr capsule Take 1 capsule (120 mg total) by mouth daily.  Marland Kitchen ELIQUIS 2.5 MG TABS tablet Take 2.5 mg by mouth 2 (two) times daily.   . furosemide (LASIX) 40 MG tablet Take 1 tablet (40 mg total) by mouth daily.  . Ginkgo Biloba 120 MG CAPS Take 120 mg by mouth daily.  . insulin glargine (LANTUS) 100 UNIT/ML injection Inject 0.2 mLs (20 Units total) into the skin daily.  . Magnesium Oxide 250 MG TABS Take 500 mg by mouth every evening. 500mg  tablets on hand  . Multiple Vitamin (MULTIVITAMIN WITH MINERALS) TABS tablet Take 1 tablet by mouth at bedtime. For supplement  . Multiple Vitamins-Minerals (PRESERVISION AREDS 2) CAPS Take 1 capsule by mouth 2 (two) times daily.  . NON FORMULARY Diet: _____ Regular, ___X___ NAS, _______Consistent Carbohydrate, _______NPO _____Other  . OXYGEN Inhale 2 L into the lungs continuous.  . pantoprazole (PROTONIX) 40 MG tablet Take 40 mg by mouth daily. For acid reflux/gerd  . traZODone (DESYREL) 100 MG tablet Take 150 mg by mouth at bedtime.    No facility-administered encounter medications on file as of 08/04/2019.     SIGNIFICANT DIAGNOSTIC EXAMS  TODAY  07-24-19: chest x-ray: Bilateral pulmonary opacities may represent edema or pneumonia. Clinical correlation is recommended.  07-27-19: chest x-ray: Continued improvement in aeration in the right chest. Trace bilateral pleural effusions. Cardiomegaly. Aortic Atherosclerosis   07-28-19: 2-d echo:  Left ventricular ejection fraction, by estimation, is 60 to 65%. The  left ventricle has normal function. The left ventricle has no regional wall motion  abnormalities. There is moderate left ventricular hypertrophy.  Left ventricular diastolic  parameters are consistent with Grade II diastolic dysfunction  (pseudonormalization). Elevated left atrial pressure.   07-30-19: chest x-ray: Slight improvement in background pulmonary edema from earlier this day, however there is worsening right suprahilar patchy opacity, may be developing asymmetric pulmonary edema or pneumonia. Given relative rapid progression, aspiration is considered. Recommend correlation for aspiration risk factors  07-31-19: ct of chest:  1. There is extensive irregular bilateral ground-glass airspace opacity, most conspicuous in the right upper lobe. These findings are new compared to prior CT  dated 09/29/2018 and most consistent with multifocal infection. 2. There is mild underlying tubular bronchiectasis throughout. 3. Small bilateral pleural effusions and associated atelectasis or consolidation. 4. Coronary artery disease.  Aortic Atherosclerosis    LABS REVIEWED TODAY;   07-24-19: wbc 13.3; hgb 9.0; hct 31.3; mcv 89.7 plt 422; glucose 205; bun 41; creat 2.50; k+ 4.1; na++ 131; ca 9.6 BNP 365.0 07-25-19: hgb a1c 8.4 07-27-19: wbc 8.9; hct 7.7; hct 26.5; mcv 88.9 plt 392; glucose 144; bun 47; creat 2.83; k+ 3.4; na++ 135; ca 9.0; mag 1.8 07-31-19; wbc 8.3; hgb 7.3; hct 25.3; mcv 869. plt 418; glucose 199; bun 51; creat 2.55; k+ 2.7; na++ 135; ca 9.0 mag 1.7; vit B 12: 577; folate 8.0; tsh 3.105; free t4: 0.97; ferritin 36; iron 106; tibc 331 08-01-19 wbc 9.2; hgb 7.2; hct 25.1; mcv 86.6 plt 455; glucose 277; bun 46; creat 2.73; k+ 4.8; na++ 133; ca 9.0 mag 2.5 cortisol 11.9 08-03-19: glucose 237; bun 51; creat 2.81; k+ 4.4; na++ 134; ca 9.1 mag 2.2    Review of Systems  Constitutional: Negative for malaise/fatigue.  Respiratory: Positive for shortness of breath. Negative for cough.   Cardiovascular: Negative for chest pain, palpitations and leg swelling.  Gastrointestinal: Positive for  constipation. Negative for abdominal pain and heartburn.  Musculoskeletal: Negative for back pain, joint pain and myalgias.  Skin: Negative.   Neurological: Negative for dizziness.  Psychiatric/Behavioral: The patient is not nervous/anxious.     Physical Exam Constitutional:      General: She is not in acute distress.    Appearance: She is well-developed. She is not diaphoretic.  Neck:     Thyroid: No thyromegaly.  Cardiovascular:     Rate and Rhythm: Normal rate and regular rhythm.     Pulses: Normal pulses.     Heart sounds: Normal heart sounds.  Pulmonary:     Effort: Pulmonary effort is normal. No respiratory distress.     Comments: 02 dependent  Bibasilar crackles  Abdominal:     General: Bowel sounds are normal. There is no distension.     Palpations: Abdomen is soft.     Tenderness: There is no abdominal tenderness.  Musculoskeletal:        General: Normal range of motion.     Cervical back: Neck supple.     Right lower leg: No edema.     Left lower leg: No edema.  Lymphadenopathy:     Cervical: No cervical adenopathy.  Skin:    General: Skin is warm and dry.  Neurological:     Mental Status: She is alert and oriented to person, place, and time.  Psychiatric:        Mood and Affect: Mood normal.       ASSESSMENT/ PLAN:  TODAY  1.  Acute on chronic diastolic (congestive) heart failure EF 60-65% (07-28-19) is presently stable will continue lasix 40 mg daily   2. Acute respiratory failure with hypoxia/ground glass opacities on imaging of lung: is without change is 02 dependent was not on 02 prior to her hospitalization. Will complete augmentin and will monitor her status.   3. Chronic afib with RVR: will continue amiodarone 200 mg daily and cardizem cd 120 mg daily for rate control will continue eliquis 2.5 mg twice daily   4. Cerebrovascular accident (CVA) due to embolism of right posterior cerebral artery: is stable will continue eliquis 2.5 mg twice daily    5. Type 2 diabetes mellitus with other circulatory complication  with long term current use of insulin: is without change: hgb a1c 8.4 will continue lantus 20 units nightly   6. Hyperlipidemia associated with type 2 diabetes mellitus: is stable will continue lipitor 40 mg daily   7. CKD stage 4 due to type 2 diabetes mellitus: is stable bun 51; creat 2.81   8. GERD without esophagitis: is stable will continue protonix 40 mg daily   9. Chronic constipation: is worse: will begin colace daily   10. Major depression recurrent chronic: is stable will continue celexa 20 mg daily wellbutrin xl 300 mg daily trazodone 150 mg nightly   11. Hypomagnesemia: is stable mag 2.2 will continue magox 500 mg daily   12. Chronic generalized pain: will continue tylenol 1 gm nightly   Will check cbc; bmp  Will  Continue palliative care.           MD is aware of resident's narcotic use and is in agreement with current plan of care. We will attempt to wean resident as appropriate.  Ok Edwards NP [facility]  Contact [phone] Monday through Friday 8am- 5pm  After hours call [phone]

## 2019-08-05 DIAGNOSIS — I5032 Chronic diastolic (congestive) heart failure: Secondary | ICD-10-CM | POA: Diagnosis not present

## 2019-08-05 DIAGNOSIS — Z515 Encounter for palliative care: Secondary | ICD-10-CM | POA: Diagnosis not present

## 2019-08-05 DIAGNOSIS — N184 Chronic kidney disease, stage 4 (severe): Secondary | ICD-10-CM | POA: Diagnosis not present

## 2019-08-06 ENCOUNTER — Encounter: Payer: Self-pay | Admitting: Internal Medicine

## 2019-08-06 ENCOUNTER — Other Ambulatory Visit: Payer: Self-pay | Admitting: Internal Medicine

## 2019-08-06 ENCOUNTER — Telehealth: Payer: Self-pay

## 2019-08-06 ENCOUNTER — Non-Acute Institutional Stay (SKILLED_NURSING_FACILITY): Payer: Medicare HMO | Admitting: Internal Medicine

## 2019-08-06 DIAGNOSIS — I129 Hypertensive chronic kidney disease with stage 1 through stage 4 chronic kidney disease, or unspecified chronic kidney disease: Secondary | ICD-10-CM | POA: Diagnosis not present

## 2019-08-06 DIAGNOSIS — Z794 Long term (current) use of insulin: Secondary | ICD-10-CM | POA: Diagnosis not present

## 2019-08-06 DIAGNOSIS — E1159 Type 2 diabetes mellitus with other circulatory complications: Secondary | ICD-10-CM

## 2019-08-06 DIAGNOSIS — I5033 Acute on chronic diastolic (congestive) heart failure: Secondary | ICD-10-CM

## 2019-08-06 DIAGNOSIS — N184 Chronic kidney disease, stage 4 (severe): Secondary | ICD-10-CM

## 2019-08-06 DIAGNOSIS — E1122 Type 2 diabetes mellitus with diabetic chronic kidney disease: Secondary | ICD-10-CM | POA: Diagnosis not present

## 2019-08-06 DIAGNOSIS — J9601 Acute respiratory failure with hypoxia: Secondary | ICD-10-CM | POA: Diagnosis not present

## 2019-08-06 DIAGNOSIS — R5383 Other fatigue: Secondary | ICD-10-CM

## 2019-08-06 NOTE — Patient Instructions (Signed)
See assessment and plan under each diagnosis in the problem list and acutely for this visit 

## 2019-08-06 NOTE — Assessment & Plan Note (Signed)
BP controlled; no change in antihypertensive medications  

## 2019-08-06 NOTE — Assessment & Plan Note (Signed)
With her advanced age and multiple advanced comorbidities; A1c goal is at least 8%.  Hypoglycemia must be avoided to this this will mimic TIAs.

## 2019-08-06 NOTE — Assessment & Plan Note (Signed)
BMET being monitored.

## 2019-08-06 NOTE — Assessment & Plan Note (Signed)
Adequate oxygen saturations being maintained.  Auscultory findings suggest significant pulmonary disease in addition to her chronic CHF.

## 2019-08-06 NOTE — Telephone Encounter (Signed)
Palliative Medicine RN Note:  Rec'd a call from [facility], who is asking for an update from the Palliative team who is seeing her grandmother at [facility]. Mrs Klinkner was referred to [facility] for outpatient palliative follow up at [facility]; I called and left a message for Colletta Maryland, their Palliative liaison, requesting she call Emilea with an update.   I called Colletta Maryland back [phone]) and provided contact information for [place].  Marjie Skiff. Yvonnie Schinke, RN, BSN, [facility] Palliative Medicine Team 08/06/2019 10:27 AM Office [phone]

## 2019-08-06 NOTE — Assessment & Plan Note (Signed)
Compensated clinically The dry rales on auscultation suggest a primary pulmonary rather than fluid overload etiology.

## 2019-08-06 NOTE — Progress Notes (Signed)
NURSING HOME LOCATION: [place] NUMBER:  128/P  CODE STATUS: DNR   PCP:  Asencion Noble MD   This is a comprehensive admission note to [facility] performed on this date less than 30 days from date of admission. Included are preadmission medical/surgical history; reconciled medication list; family history; social history and comprehensive review of systems.  Corrections and additions to the records were documented. Comprehensive physical exam was also performed. Additionally a clinical summary was entered for each active diagnosis pertinent to this admission in the Problem List to enhance continuity of care.  HPI: Patient was hospitalized 5/29-08/03/2019 presenting from home with a 3-day history of shortness of breath and generalized weakness.  She had been off her Lasix for approximately 2 weeks, allegedly at the advice of her cardiologist's office.  Since that time the patient had been experiencing increasing shortness of breath.  The acute on chronic congestive heart failure was associated with acute respiratory failure with hypoxia requiring O2 supplementation.  Increased interstitial markings were present on the chest x-ray.  CT scan revealed irregular bilateral groundglass opacifications especially in the right upper lobe with associated underlying bronchiectasis.  Small bilateral effusions were present.  Cefepime was initiated and transitioned to Augmentin to be continued post discharge.  IV Lasix was initiated with clinical improvement; she was transitioned to oral Lasix.  I&O's revealed -5.6 L and loss of 5 pounds.   Cardizem was titrated for the PAF. On the evening of 6/4 patient became unresponsive due to hypoglycemia and was placed on a D10 drip.  This was weaned and insulin dosage adjusted.  Most recent A1c was on 5/30 revealed a value of 8.4%. She has baseline CKD stage IV with creatinine ranging from 2.3-2.5. Mirtazapine was discontinued due to lethargy.  This  resulted in improved mental status. Palliative care consulted and patient was made DNR.  Past medical and surgical history: Includes history of stroke, history of PFO, essential hypertension, history of hepatic cirrhosis, dyslipidemia, and depression.  Social history: Nondrinker, non-smoker.  Family history: Reviewed; noncontributory due to advanced age.   Review of systems: The veracity is in question due to altered mental status.  She was markedly lethargic and somewhat difficult to arouse.  She only opened her eyes when I asked her to do assess for any significant change.  She gave the date as Wednesday (not Friday), June?,  2021.  She could not identify the president. When asked if she were having any health issues her response was "feeling like yesterday, feel bad".  She was unable or unwilling to elaborate. She denies any significant cardiopulmonary symptoms.  She states that she "may be constipated".  She denies any anxiety or depression.  Constitutional: No fever, significant weight change Eyes: No redness, discharge, pain, vision change ENT/mouth: No nasal congestion, purulent discharge, earache, change in hearing, sore throat  Cardiovascular: No chest pain, palpitations, paroxysmal nocturnal dyspnea, claudication, edema  Respiratory: No cough, sputum production, hemoptysis, DOE, significant snoring, apnea  Gastrointestinal: No heartburn, dysphagia, abdominal pain, nausea /vomiting, rectal bleeding, melena Genitourinary: No dysuria, hematuria, pyuria, incontinence, nocturia Musculoskeletal: No joint stiffness, joint swelling, weakness, pain Dermatologic: No rash, pruritus, change in appearance of skin Neurologic: No dizziness, headache, syncope, seizures, numbness, tingling Endocrine: No change in hair/skin/nails, excessive thirst, excessive hunger, excessive urination  Hematologic/lymphatic: No significant bruising, lymphadenopathy, abnormal bleeding Allergy/immunology: No  itchy/watery eyes, significant sneezing, urticaria, angioedema  Physical exam:  Pertinent or positive findings: As noted she is markedly lethargic and  difficult to arouse to some extent.  Central obesity is present. She was in a wheelchair with nasal oxygen and wearing a mask.  Hair is disheveled. Even though she could be aroused ; she kept her eyes closed. She exhibited intermittent dry cough. She has diffuse rales, most notable at the bases which are dry & somewhat rub-like in character.  She has a raspy grade 9/4-8 basilar systolic murmur.  1/2+ edema is noted.  Pedal pulses are decreased.  The lower extremities are stronger than the upper extremities to opposition.  Right upper extremity clinically seems stronger than the left.  She exhibits intermittent myoclonic jerking of the upper extremities.  There is no definite tremor.  She has scattered bruising over the extremities especially the forearms compared to the shins.  Some exfoliation changes are present over the shins.  General appearance:  no acute distress, increased work of breathing is present.   Lymphatic: No lymphadenopathy about the head, neck, axilla. Eyes: No conjunctival inflammation or lid edema is present. There is no scleral icterus. Ears:  External ear exam shows no significant lesions or deformities.   Nose:  External nasal examination shows no deformity or inflammation. Nasal mucosa are pink and moist without lesions, exudates Oral exam: Lips and gums are healthy appearing. Neck:  No thyromegaly, masses, tenderness noted.    Heart:  Normal rate and regular rhythm. S1 and S2 normal without gallop, click, rub.  Lungs:  without wheezes, rhonchi, rubs. Abdomen: Bowel sounds are normal.  Abdomen is soft and nontender with no organomegaly, hernias, masses. GU: Deferred  Extremities:  No cyanosis, clubbing. Neurologic exam: Balance, Rhomberg, finger to nose testing could not be completed due to clinical state Skin: Warm & dry  w/o tenting. No significant rash.  See clinical summary under each active problem in the Problem List with associated updated therapeutic plan

## 2019-08-09 ENCOUNTER — Encounter: Payer: Self-pay | Admitting: Adult Health

## 2019-08-09 ENCOUNTER — Non-Acute Institutional Stay (SKILLED_NURSING_FACILITY): Payer: Medicare HMO | Admitting: Adult Health

## 2019-08-09 ENCOUNTER — Encounter (HOSPITAL_COMMUNITY)
Admission: RE | Admit: 2019-08-09 | Discharge: 2019-08-09 | Disposition: A | Discharge status: Home / Self Care | Payer: Medicare HMO | Source: Skilled Nursing Facility | Attending: Adult Health | Admitting: Adult Health

## 2019-08-09 DIAGNOSIS — I1310 Hypertensive heart and chronic kidney disease without heart failure, with stage 1 through stage 4 chronic kidney disease, or unspecified chronic kidney disease: Secondary | ICD-10-CM | POA: Insufficient documentation

## 2019-08-09 DIAGNOSIS — E1159 Type 2 diabetes mellitus with other circulatory complications: Secondary | ICD-10-CM

## 2019-08-09 DIAGNOSIS — Z794 Long term (current) use of insulin: Secondary | ICD-10-CM

## 2019-08-09 LAB — HEMOGLOBIN AND HEMATOCRIT, BLOOD
HCT: 24.8 % — ABNORMAL LOW (ref 36.0–46.0)
Hemoglobin: 7.2 g/dL — ABNORMAL LOW (ref 12.0–15.0)

## 2019-08-09 LAB — BASIC METABOLIC PANEL
Anion gap: 8 (ref 5–15)
BUN: 48 mg/dL — ABNORMAL HIGH (ref 8–23)
CO2: 34 mmol/L — ABNORMAL HIGH (ref 22–32)
Calcium: 9.3 mg/dL (ref 8.9–10.3)
Chloride: 90 mmol/L — ABNORMAL LOW (ref 98–111)
Creatinine, Ser: 2.95 mg/dL — ABNORMAL HIGH (ref 0.44–1.00)
GFR calc Af Amer: 16 mL/min — ABNORMAL LOW (ref >60)
GFR calc non Af Amer: 14 mL/min — ABNORMAL LOW (ref >60)
Glucose, Bld: 300 mg/dL — ABNORMAL HIGH (ref 70–99)
Potassium: 4.3 mmol/L (ref 3.5–5.1)
Sodium: 132 mmol/L — ABNORMAL LOW (ref 135–145)

## 2019-08-09 NOTE — Progress Notes (Signed)
Location:    [place] Room Number: 128/P Place of Service:  SNF (31)   CODE STATUS: DNR  No Known Allergies  Chief Complaint  Patient presents with  . Follow-up    Labs    HPI:  Her glucose this AM is 300. Her cbg readings are all elevated. She denies any excessive hunger or thirst. She does have bilateral lower extremity edema.   Past Medical History:  Diagnosis Date  . Chronic anticoagulation   . Depression   . Diabetes mellitus   . Hyperlipidemia   . Hypertension   . PAF (paroxysmal atrial fibrillation) ([place])   . PFO (patent foramen ovale)   . Stroke ([place])    TIA's x 2    Past Surgical History:  Procedure Laterality Date  . COLONOSCOPY    . COLONOSCOPY N/A 09/09/2014   Procedure: COLONOSCOPY;  Surgeon: Rogene Houston, MD;  [facility];  Service: Endoscopy;  Laterality: N/A;  1010 - moved to 7:30 - Ann to notify  . EP IMPLANTABLE DEVICE N/A 06/07/2015   Procedure: Loop Recorder Insertion;  Surgeon: Deboraha Sprang, MD;  Location: [place] [facility] LAB;  Service: Cardiovascular;  Laterality: N/A;  . TEE WITHOUT CARDIOVERSION N/A 06/07/2015   Procedure: TRANSESOPHAGEAL ECHOCARDIOGRAM (TEE);  Surgeon: Thayer Headings, MD;  Location: [facility] ENDOSCOPY;  Service: Cardiovascular;  Laterality: N/A;    Social History   Socioeconomic History  . Marital status: Widowed    Spouse name: Not on file  . Number of children: Not on file  . Years of education: Not on file  . Highest education level: Not on file  Occupational History  . Not on file  Tobacco Use  . Smoking status: Never Smoker  . Smokeless tobacco: Never Used  Vaping Use  . Vaping Use: Never used  Substance and Sexual Activity  . Alcohol use: No  . Drug use: No  . Sexual activity: Never  Other Topics Concern  . Not on file  Social History Narrative  . Not on file   Social Determinants of Health   Financial Resource Strain:   . Difficulty of Paying Living Expenses:     Food Insecurity:   . Worried About Charity fundraiser in the Last Year:   . Arboriculturist in the Last Year:   Transportation Needs:   . Film/video editor (Medical):   Marland Kitchen Lack of Transportation (Non-Medical):   Physical Activity:   . Days of Exercise per Week:   . Minutes of Exercise per Session:   Stress:   . Feeling of Stress :   Social Connections:   . Frequency of Communication with Friends and Family:   . Frequency of Social Gatherings with Friends and Family:   . Attends Religious Services:   . Active Member of Clubs or Organizations:   . Attends Archivist Meetings:   Marland Kitchen Marital Status:   Intimate Partner Violence:   . Fear of Current or Ex-Partner:   . Emotionally Abused:   Marland Kitchen Physically Abused:   . Sexually Abused:    Family History  Problem Relation Age of Onset  . Stroke Father   . Dementia Sister       VITAL SIGNS BP 124/65   Pulse 78   Temp 98.4 F (36.9 C) (Oral)   Resp 20   Ht 5\' 4"  (1.626 m)   Wt 194 lb 9.6 oz (88.3 kg)   SpO2 92%  BMI 33.40 kg/m   Outpatient Encounter Medications as of 08/09/2019  Medication Sig  . acetaminophen (TYLENOL) 500 MG tablet Take 1,000 mg by mouth at bedtime. may also take 2 tablets during the day as needed for pain  . amiodarone (PACERONE) 200 MG tablet Take 1 tablet (200 mg total) by mouth daily.  Marland Kitchen atorvastatin (LIPITOR) 20 MG tablet Take 2 tablets (40 mg total) by mouth every evening. 4pm  . buPROPion (WELLBUTRIN XL) 300 MG 24 hr tablet Take 300 mg by mouth daily.  . citalopram (CELEXA) 20 MG tablet Take 20 mg by mouth daily.   Marland Kitchen diltiazem (CARDIZEM CD) 120 MG 24 hr capsule Take 1 capsule (120 mg total) by mouth daily.  Marland Kitchen docusate sodium (COLACE) 100 MG capsule Take 100 mg by mouth daily.  Marland Kitchen ELIQUIS 2.5 MG TABS tablet Take 2.5 mg by mouth 2 (two) times daily.   . furosemide (LASIX) 40 MG tablet Take 1 tablet (40 mg total) by mouth daily.  . insulin glargine (LANTUS) 100 UNIT/ML injection Inject 25  Units into the skin daily.  . insulin lispro (HUMALOG) 100 UNIT/ML injection Inject 5 Units into the skin 3 (three) times daily with meals.  . Magnesium Oxide 250 MG TABS Take 500 mg by mouth every evening. 500mg  tablets on hand  . Multiple Vitamin (MULTIVITAMIN WITH MINERALS) TABS tablet Take 1 tablet by mouth at bedtime. For supplement  . Multiple Vitamins-Minerals (PRESERVISION AREDS 2) CAPS Take 1 capsule by mouth 2 (two) times daily.  . NON FORMULARY Diet: _____ Regular, ___X___ NAS, _______Consistent Carbohydrate, _______NPO _____Other  . OXYGEN Inhale 2 L into the lungs continuous.  . pantoprazole (PROTONIX) 40 MG tablet Take 40 mg by mouth daily. For acid reflux/gerd  . traZODone (DESYREL) 100 MG tablet Take 150 mg by mouth at bedtime.   . [DISCONTINUED] amoxicillin-clavulanate (AUGMENTIN) 500-125 MG tablet Take 1 tablet (500 mg total) by mouth 2 (two) times daily. X 4 more days  . [DISCONTINUED] insulin glargine (LANTUS) 100 UNIT/ML injection Inject 0.2 mLs (20 Units total) into the skin daily.   No facility-administered encounter medications on file as of 08/09/2019.     SIGNIFICANT DIAGNOSTIC EXAMS  PREVIOUS  07-24-19: chest x-ray: Bilateral pulmonary opacities may represent edema or pneumonia. Clinical correlation is recommended.  07-27-19: chest x-ray: Continued improvement in aeration in the right chest. Trace bilateral pleural effusions. Cardiomegaly. Aortic Atherosclerosis   07-28-19: 2-d echo:  Left ventricular ejection fraction, by estimation, is 60 to 65%. The  left ventricle has normal function. The left ventricle has no regional wall motion abnormalities. There is moderate left ventricular hypertrophy.  Left ventricular diastolic  parameters are consistent with Grade II diastolic dysfunction  (pseudonormalization). Elevated left atrial pressure.   07-30-19: chest x-ray: Slight improvement in background pulmonary edema from earlier this day, however there is worsening right  suprahilar patchy opacity, may be developing asymmetric pulmonary edema or pneumonia. Given relative rapid progression, aspiration is considered. Recommend correlation for aspiration risk factors  07-31-19: ct of chest:  1. There is extensive irregular bilateral ground-glass airspace opacity, most conspicuous in the right upper lobe. These findings are new compared to prior CT dated 09/29/2018 and most consistent with multifocal infection. 2. There is mild underlying tubular bronchiectasis throughout. 3. Small bilateral pleural effusions and associated atelectasis or consolidation. 4. Coronary artery disease.  Aortic Atherosclerosis   NO NEW EXAMS.    LABS REVIEWED PREVIOUS;   07-24-19: wbc 13.3; hgb 9.0; hct 31.3; mcv 89.7 plt 422;  glucose 205; bun 41; creat 2.50; k+ 4.1; na++ 131; ca 9.6 BNP 365.0 07-25-19: hgb a1c 8.4 07-27-19: wbc 8.9; hct 7.7; hct 26.5; mcv 88.9 plt 392; glucose 144; bun 47; creat 2.83; k+ 3.4; na++ 135; ca 9.0; mag 1.8 07-31-19; wbc 8.3; hgb 7.3; hct 25.3; mcv 869. plt 418; glucose 199; bun 51; creat 2.55; k+ 2.7; na++ 135; ca 9.0 mag 1.7; vit B 12: 577; folate 8.0; tsh 3.105; free t4: 0.97; ferritin 36; iron 106; tibc 331 08-01-19 wbc 9.2; hgb 7.2; hct 25.1; mcv 86.6 plt 455; glucose 277; bun 46; creat 2.73; k+ 4.8; na++ 133; ca 9.0 mag 2.5 cortisol 11.9 08-03-19: glucose 237; bun 51; creat 2.81; k+ 4.4; na++ 134; ca 9.1 mag 2.2   TODAY  08-09-19: glucose 300; bun 48; creat 2.95; k+ 4.3; na++ 132; ca 9.3    Review of Systems  Constitutional: Negative for malaise/fatigue.  Respiratory: Negative for cough and shortness of breath.   Cardiovascular: Negative for chest pain, palpitations and leg swelling.  Gastrointestinal: Negative for abdominal pain, constipation and heartburn.  Musculoskeletal: Negative for back pain, joint pain and myalgias.  Skin: Negative.   Neurological: Negative for dizziness.  Psychiatric/Behavioral: The patient is not nervous/anxious.     Physical  Exam Constitutional:      General: She is not in acute distress.    Appearance: She is well-developed. She is obese. She is not diaphoretic.  Neck:     Thyroid: No thyromegaly.  Cardiovascular:     Rate and Rhythm: Normal rate and regular rhythm.     Heart sounds: Normal heart sounds.  Pulmonary:     Effort: Pulmonary effort is normal. No respiratory distress.     Comments: 02 dependent  Scattered crackles  Abdominal:     General: Bowel sounds are normal. There is no distension.     Palpations: Abdomen is soft.     Tenderness: There is no abdominal tenderness.  Musculoskeletal:        General: Normal range of motion.     Cervical back: Neck supple.     Right lower leg: Edema present.     Left lower leg: Edema present.  Lymphadenopathy:     Cervical: No cervical adenopathy.  Skin:    General: Skin is warm and dry.  Neurological:     Mental Status: She is alert and oriented to person, place, and time.  Psychiatric:        Mood and Affect: Mood normal.      ASSESSMENT/ PLAN:  TODAY  1. Type 2 diabetes mellitus with other circulatory complication with long term current use of insulin: cbg's are elevated: hgb a1c 84 will change to the following: lantus 25 units nightly humalog 5 units with meals for cbg >150      MD is aware of resident's narcotic use and is in agreement with current plan of care. We will attempt to wean resident as appropriate.  Ok Edwards NP [facility]  Contact [phone] Monday through Friday 8am- 5pm  After hours call [phone]

## 2019-08-11 ENCOUNTER — Encounter: Payer: Self-pay | Admitting: Adult Health

## 2019-08-11 ENCOUNTER — Non-Acute Institutional Stay: Payer: Self-pay | Admitting: Adult Health

## 2019-08-11 DIAGNOSIS — Z794 Long term (current) use of insulin: Secondary | ICD-10-CM

## 2019-08-11 DIAGNOSIS — F339 Major depressive disorder, recurrent, unspecified: Secondary | ICD-10-CM

## 2019-08-11 DIAGNOSIS — E1159 Type 2 diabetes mellitus with other circulatory complications: Secondary | ICD-10-CM | POA: Diagnosis not present

## 2019-08-11 NOTE — Progress Notes (Signed)
Location:    [place] Room Number: 128/P Place of Service:  SNF (31)   CODE STATUS: DNR  No Known Allergies  Chief Complaint  Patient presents with   Acute Visit     Medication Review    HPI:  She is presently taking trazodone 75 mg nightly which was lowered from 150 mg at admission. Her cbg readings are very high up to 400. She is presently taking lantus 25 units nightly and novolog 5 units with meals. She denies any insomnia. No reports of uncontrolled pain. No changes in appetite.   Past Medical History:  Diagnosis Date   Chronic anticoagulation    Depression    Diabetes mellitus    Hyperlipidemia    Hypertension    PAF (paroxysmal atrial fibrillation) ([facility])    PFO (patent foramen ovale)    Stroke ([facility])    TIA's x 2    Past Surgical History:  Procedure Laterality Date   COLONOSCOPY     COLONOSCOPY N/A 09/09/2014   Procedure: COLONOSCOPY;  Surgeon: Rogene Houston, MD;  [facility];  Service: Endoscopy;  Laterality: N/A;  1010 - moved to 7:30 - Ann to notify   EP IMPLANTABLE DEVICE N/A 06/07/2015   Procedure: Loop Recorder Insertion;  Surgeon: Deboraha Sprang, MD;  Location: [place] [facility] LAB;  Service: Cardiovascular;  Laterality: N/A;   TEE WITHOUT CARDIOVERSION N/A 06/07/2015   Procedure: TRANSESOPHAGEAL ECHOCARDIOGRAM (TEE);  Surgeon: Thayer Headings, MD;  Location: [facility] ENDOSCOPY;  Service: Cardiovascular;  Laterality: N/A;    Social History   Socioeconomic History   Marital status: Widowed    Spouse name: Not on file   Number of children: Not on file   Years of education: Not on file   Highest education level: Not on file  Occupational History   Not on file  Tobacco Use   Smoking status: Never Smoker   Smokeless tobacco: Never Used  Vaping Use   Vaping Use: Never used  Substance and Sexual Activity   Alcohol use: No   Drug use: No   Sexual activity: Never  Other Topics Concern   Not on  file  Social History Narrative   Not on file   Social Determinants of Health   Financial Resource Strain:    Difficulty of Paying Living Expenses:   Food Insecurity:    Worried About Charity fundraiser in the Last Year:    Arboriculturist in the Last Year:   Transportation Needs:    Film/video editor (Medical):    Lack of Transportation (Non-Medical):   Physical Activity:    Days of Exercise per Week:    Minutes of Exercise per Session:   Stress:    Feeling of Stress :   Social Connections:    Frequency of Communication with Friends and Family:    Frequency of Social Gatherings with Friends and Family:    Attends Religious Services:    Active Member of Clubs or Organizations:    Attends Music therapist:    Marital Status:   Intimate Partner Violence:    Fear of Current or Ex-Partner:    Emotionally Abused:    Physically Abused:    Sexually Abused:    Family History  Problem Relation Age of Onset   Stroke Father    Dementia Sister       VITAL SIGNS BP 140/60    Pulse 84    Temp 98.3  F (36.8 C) (Oral)    Resp 20    Ht 5\' 4"  (1.626 m)    Wt 195 lb (88.5 kg)    SpO2 95%    BMI 33.47 kg/m   Outpatient Encounter Medications as of 08/11/2019  Medication Sig   acetaminophen (TYLENOL) 500 MG tablet Take 1,000 mg by mouth at bedtime. may also take 2 tablets during the day as needed for pain   amiodarone (PACERONE) 200 MG tablet Take 1 tablet (200 mg total) by mouth daily.   atorvastatin (LIPITOR) 20 MG tablet Take 2 tablets (40 mg total) by mouth every evening. 4pm   buPROPion (WELLBUTRIN XL) 300 MG 24 hr tablet Take 300 mg by mouth daily.   citalopram (CELEXA) 20 MG tablet Take 20 mg by mouth daily.    diltiazem (CARDIZEM CD) 120 MG 24 hr capsule Take 1 capsule (120 mg total) by mouth daily.   docusate sodium (COLACE) 100 MG capsule Take 100 mg by mouth daily.   ELIQUIS 2.5 MG TABS tablet Take 2.5 mg by mouth 2 (two) times  daily.    furosemide (LASIX) 40 MG tablet Take 1 tablet (40 mg total) by mouth daily.   insulin glargine (LANTUS) 100 UNIT/ML injection Inject 25 Units into the skin daily.   insulin lispro (HUMALOG) 100 UNIT/ML injection Inject 5 Units into the skin 3 (three) times daily with meals.   Magnesium Oxide 250 MG TABS Take 500 mg by mouth every evening. 500mg  tablets on hand   Multiple Vitamin (MULTIVITAMIN WITH MINERALS) TABS tablet Take 1 tablet by mouth at bedtime. For supplement   Multiple Vitamins-Minerals (PRESERVISION AREDS 2) CAPS Take 1 capsule by mouth 2 (two) times daily.   NON FORMULARY Diet: _____ Regular, ___X___ NAS, _______Consistent Carbohydrate, _______NPO _____Other   OXYGEN Inhale 2 L into the lungs continuous.   pantoprazole (PROTONIX) 40 MG tablet Take 40 mg by mouth daily. For acid reflux/gerd   traZODone (DESYREL) 100 MG tablet Take 150 mg by mouth at bedtime.    No facility-administered encounter medications on file as of 08/11/2019.     SIGNIFICANT DIAGNOSTIC EXAMS   PREVIOUS  07-24-19: chest x-ray: Bilateral pulmonary opacities may represent edema or pneumonia. Clinical correlation is recommended.  07-27-19: chest x-ray: Continued improvement in aeration in the right chest. Trace bilateral pleural effusions. Cardiomegaly. Aortic Atherosclerosis   07-28-19: 2-d echo:  Left ventricular ejection fraction, by estimation, is 60 to 65%. The  left ventricle has normal function. The left ventricle has no regional wall motion abnormalities. There is moderate left ventricular hypertrophy.  Left ventricular diastolic  parameters are consistent with Grade II diastolic dysfunction  (pseudonormalization). Elevated left atrial pressure.   07-30-19: chest x-ray: Slight improvement in background pulmonary edema from earlier this day, however there is worsening right suprahilar patchy opacity, may be developing asymmetric pulmonary edema or pneumonia. Given relative rapid  progression, aspiration is considered. Recommend correlation for aspiration risk factors  07-31-19: ct of chest:  1. There is extensive irregular bilateral ground-glass airspace opacity, most conspicuous in the right upper lobe. These findings are new compared to prior CT dated 09/29/2018 and most consistent with multifocal infection. 2. There is mild underlying tubular bronchiectasis throughout. 3. Small bilateral pleural effusions and associated atelectasis or consolidation. 4. Coronary artery disease.  Aortic Atherosclerosis   NO NEW EXAMS.    LABS REVIEWED PREVIOUS;   07-24-19: wbc 13.3; hgb 9.0; hct 31.3; mcv 89.7 plt 422; glucose 205; bun 41; creat 2.50; k+ 4.1; na++  131; ca 9.6 BNP 365.0 07-25-19: hgb a1c 8.4 07-27-19: wbc 8.9; hct 7.7; hct 26.5; mcv 88.9 plt 392; glucose 144; bun 47; creat 2.83; k+ 3.4; na++ 135; ca 9.0; mag 1.8 07-31-19; wbc 8.3; hgb 7.3; hct 25.3; mcv 869. plt 418; glucose 199; bun 51; creat 2.55; k+ 2.7; na++ 135; ca 9.0 mag 1.7; vit B 12: 577; folate 8.0; tsh 3.105; free t4: 0.97; ferritin 36; iron 106; tibc 331 08-01-19 wbc 9.2; hgb 7.2; hct 25.1; mcv 86.6 plt 455; glucose 277; bun 46; creat 2.73; k+ 4.8; na++ 133; ca 9.0 mag 2.5 cortisol 11.9 08-03-19: glucose 237; bun 51; creat 2.81; k+ 4.4; na++ 134; ca 9.1 mag 2.2  08-09-19: glucose 300; bun 48; creat 2.95; k+ 4.3; na++ 132; ca 9.3  NO NEW LABS.    Review of Systems  Constitutional: Negative for malaise/fatigue.  Respiratory: Negative for cough and shortness of breath.   Cardiovascular: Negative for chest pain, palpitations and leg swelling.  Gastrointestinal: Negative for abdominal pain, constipation and heartburn.  Musculoskeletal: Negative for back pain, joint pain and myalgias.  Skin: Negative.   Neurological: Negative for dizziness.  Psychiatric/Behavioral: The patient is not nervous/anxious.    Physical Exam Constitutional:      General: She is not in acute distress.    Appearance: She is well-developed.  She is obese. She is not diaphoretic.  Neck:     Thyroid: No thyromegaly.  Cardiovascular:     Rate and Rhythm: Normal rate and regular rhythm.     Pulses: Normal pulses.     Heart sounds: Normal heart sounds.  Pulmonary:     Effort: Pulmonary effort is normal. No respiratory distress.     Breath sounds: Normal breath sounds.     Comments: 02 dependent  Abdominal:     General: Bowel sounds are normal. There is no distension.     Palpations: Abdomen is soft.     Tenderness: There is no abdominal tenderness.  Musculoskeletal:        General: Normal range of motion.     Cervical back: Neck supple.     Right lower leg: Edema present.     Left lower leg: Edema present.  Lymphadenopathy:     Cervical: No cervical adenopathy.  Skin:    General: Skin is warm and dry.  Neurological:     Mental Status: She is alert and oriented to person, place, and time.  Psychiatric:        Mood and Affect: Mood normal.       ASSESSMENT/ PLAN:  TODAY  1. Major depression chronic recurrent 2. Type 2 diabetes mellitus with other circulatory complication with long term current use of insulin  Will continue trazodone 75 mg nightly Will increase lantus to 30 units nightly  Will increase novolog to 10 units with meals.  Will monitor her status.   MD is aware of resident's narcotic use and is in agreement with current plan of care. We will attempt to wean resident as appropriate.  Ok Edwards NP [facility]  Contact [phone] Monday through Friday 8am- 5pm  After hours call [phone]

## 2019-08-12 ENCOUNTER — Encounter: Payer: Self-pay | Admitting: Adult Health

## 2019-08-12 ENCOUNTER — Non-Acute Institutional Stay (SKILLED_NURSING_FACILITY): Payer: Medicare HMO | Admitting: Adult Health

## 2019-08-12 DIAGNOSIS — I5033 Acute on chronic diastolic (congestive) heart failure: Secondary | ICD-10-CM | POA: Diagnosis not present

## 2019-08-12 DIAGNOSIS — I482 Chronic atrial fibrillation, unspecified: Secondary | ICD-10-CM

## 2019-08-12 DIAGNOSIS — J9601 Acute respiratory failure with hypoxia: Secondary | ICD-10-CM | POA: Diagnosis not present

## 2019-08-12 NOTE — Progress Notes (Signed)
Location:    [place] Room Number: 128/P Place of Service:  SNF (31)   CODE STATUS: DNR  No Known Allergies  Chief Complaint  Patient presents with  . Short Term Rehab       Acute on chronic diastolic (congestive) heart failure:  Acute respiratory with hypoxia:  Chronic afib with RVR   Weekly follow up for the first 30 days post hospitalization.     HPI:  She is a 86 year old short term rehab patient being seen for the management of her chronic illnesses: chf; respiratory failure; afib. No complicates of palpitations; no cough; does have shortness of breath with exertion. There are no reports of uncontrolled pain.    Past Medical History:  Diagnosis Date  . Chronic anticoagulation   . Depression   . Diabetes mellitus   . Hyperlipidemia   . Hypertension   . PAF (paroxysmal atrial fibrillation) ([place])   . PFO (patent foramen ovale)   . Stroke ([place])    TIA's x 2    Past Surgical History:  Procedure Laterality Date  . COLONOSCOPY    . COLONOSCOPY N/A 09/09/2014   Procedure: COLONOSCOPY;  Surgeon: Rogene Houston, MD;  [facility];  Service: Endoscopy;  Laterality: N/A;  1010 - moved to 7:30 - Ann to notify  . EP IMPLANTABLE DEVICE N/A 06/07/2015   Procedure: Loop Recorder Insertion;  Surgeon: Deboraha Sprang, MD;  Location: [place] [facility];  Service: Cardiovascular;  Laterality: N/A;  . TEE WITHOUT CARDIOVERSION N/A 06/07/2015   Procedure: TRANSESOPHAGEAL ECHOCARDIOGRAM (TEE);  Surgeon: Thayer Headings, MD;  Location: [facility] ENDOSCOPY;  Service: Cardiovascular;  Laterality: N/A;    Social History   Socioeconomic History  . Marital status: Widowed    Spouse name: Not on file  . Number of children: Not on file  . Years of education: Not on file  . Highest education level: Not on file  Occupational History  . Not on file  Tobacco Use  . Smoking status: Never Smoker  . Smokeless tobacco: Never Used  Vaping Use  . Vaping Use: Never used   Substance and Sexual Activity  . Alcohol use: No  . Drug use: No  . Sexual activity: Never  Other Topics Concern  . Not on file  Social History Narrative  . Not on file   Social Determinants of Health   Financial Resource Strain:   . Difficulty of Paying Living Expenses:   Food Insecurity:   . Worried About Charity fundraiser in the Last Year:   . Arboriculturist in the Last Year:   Transportation Needs:   . Film/video editor (Medical):   Marland Kitchen Lack of Transportation (Non-Medical):   Physical Activity:   . Days of Exercise per Week:   . Minutes of Exercise per Session:   Stress:   . Feeling of Stress :   Social Connections:   . Frequency of Communication with Friends and Family:   . Frequency of Social Gatherings with Friends and Family:   . Attends Religious Services:   . Active Member of Clubs or Organizations:   . Attends Archivist Meetings:   Marland Kitchen Marital Status:   Intimate Partner Violence:   . Fear of Current or Ex-Partner:   . Emotionally Abused:   Marland Kitchen Physically Abused:   . Sexually Abused:    Family History  Problem Relation Age of Onset  . Stroke Father   .  Dementia Sister       VITAL SIGNS BP 140/60   Pulse 84   Temp 97.8 F (36.6 C) (Oral)   Resp 20   Ht 5\' 4"  (1.626 m)   Wt 195 lb (88.5 kg)   SpO2 92%   BMI 33.47 kg/m   Outpatient Encounter Medications as of 08/12/2019  Medication Sig  . acetaminophen (TYLENOL) 500 MG tablet Take 1,000 mg by mouth at bedtime. may also take 2 tablets during the day as needed for pain  . amiodarone (PACERONE) 200 MG tablet Take 1 tablet (200 mg total) by mouth daily.  Marland Kitchen atorvastatin (LIPITOR) 20 MG tablet Take 2 tablets (40 mg total) by mouth every evening. 4pm  . buPROPion (WELLBUTRIN XL) 300 MG 24 hr tablet Take 300 mg by mouth daily.  . citalopram (CELEXA) 20 MG tablet Take 20 mg by mouth daily.   Marland Kitchen diltiazem (CARDIZEM CD) 120 MG 24 hr capsule Take 1 capsule (120 mg total) by mouth daily.  Marland Kitchen  docusate sodium (COLACE) 100 MG capsule Take 100 mg by mouth daily.  Marland Kitchen ELIQUIS 2.5 MG TABS tablet Take 2.5 mg by mouth 2 (two) times daily.   . furosemide (LASIX) 40 MG tablet Take 1 tablet (40 mg total) by mouth daily.  . insulin aspart (NOVOLOG FLEXPEN) 100 UNIT/ML FlexPen insulin pen; 100 unit/mL (3 mL); amt: 10 units; subcutaneous Special Instructions: admin 5 units for CBG >150 Before Meals 08:00 AM, 11:30 AM, 05:00 PM  . insulin glargine (LANTUS) 100 UNIT/ML injection Inject 30 Units into the skin at bedtime.   . Magnesium Oxide 250 MG TABS Take 500 mg by mouth every evening.   . Multiple Vitamin (MULTIVITAMIN WITH MINERALS) TABS tablet Take 1 tablet by mouth at bedtime. For supplement  . Multiple Vitamins-Minerals (PRESERVISION AREDS 2) CAPS Take 1 capsule by mouth 2 (two) times daily.  . NON FORMULARY Diet: _____ Regular, ___X___ NAS, _______Consistent Carbohydrate, _______NPO _____Other  . OXYGEN Inhale 2 L into the lungs continuous.  . pantoprazole (PROTONIX) 40 MG tablet Take 40 mg by mouth daily. For acid reflux/gerd  . traZODone (DESYREL) 100 MG tablet Take 75 mg by mouth at bedtime.   . [DISCONTINUED] insulin lispro (HUMALOG) 100 UNIT/ML injection Inject 5 Units into the skin 3 (three) times daily with meals.   No facility-administered encounter medications on file as of 08/12/2019.     SIGNIFICANT DIAGNOSTIC EXAMS   PREVIOUS  07-24-19: chest x-ray: Bilateral pulmonary opacities may represent edema or pneumonia. Clinical correlation is recommended.  07-27-19: chest x-ray: Continued improvement in aeration in the right chest. Trace bilateral pleural effusions. Cardiomegaly. Aortic Atherosclerosis   07-28-19: 2-d echo:  Left ventricular ejection fraction, by estimation, is 60 to 65%. The  left ventricle has normal function. The left ventricle has no regional wall motion abnormalities. There is moderate left ventricular hypertrophy.  Left ventricular diastolic  parameters are  consistent with Grade II diastolic dysfunction  (pseudonormalization). Elevated left atrial pressure.   07-30-19: chest x-ray: Slight improvement in background pulmonary edema from earlier this day, however there is worsening right suprahilar patchy opacity, may be developing asymmetric pulmonary edema or pneumonia. Given relative rapid progression, aspiration is considered. Recommend correlation for aspiration risk factors  07-31-19: ct of chest:  1. There is extensive irregular bilateral ground-glass airspace opacity, most conspicuous in the right upper lobe. These findings are new compared to prior CT dated 09/29/2018 and most consistent with multifocal infection. 2. There is mild underlying tubular bronchiectasis throughout.  3. Small bilateral pleural effusions and associated atelectasis or consolidation. 4. Coronary artery disease.  Aortic Atherosclerosis   NO NEW EXAMS.    LABS REVIEWED PREVIOUS;   07-24-19: wbc 13.3; hgb 9.0; hct 31.3; mcv 89.7 plt 422; glucose 205; bun 41; creat 2.50; k+ 4.1; na++ 131; ca 9.6 BNP 365.0 07-25-19: hgb a1c 8.4 07-27-19: wbc 8.9; hct 7.7; hct 26.5; mcv 88.9 plt 392; glucose 144; bun 47; creat 2.83; k+ 3.4; na++ 135; ca 9.0; mag 1.8 07-31-19; wbc 8.3; hgb 7.3; hct 25.3; mcv 869. plt 418; glucose 199; bun 51; creat 2.55; k+ 2.7; na++ 135; ca 9.0 mag 1.7; vit B 12: 577; folate 8.0; tsh 3.105; free t4: 0.97; ferritin 36; iron 106; tibc 331 08-01-19 wbc 9.2; hgb 7.2; hct 25.1; mcv 86.6 plt 455; glucose 277; bun 46; creat 2.73; k+ 4.8; na++ 133; ca 9.0 mag 2.5 cortisol 11.9 08-03-19: glucose 237; bun 51; creat 2.81; k+ 4.4; na++ 134; ca 9.1 mag 2.2  08-09-19: glucose 300; bun 48; creat 2.95; k+ 4.3; na++ 132; ca 9.3  NO NEW LABS.    Review of Systems  Constitutional: Negative for malaise/fatigue.  Respiratory: Negative for cough and shortness of breath.   Cardiovascular: Negative for chest pain, palpitations and leg swelling.  Gastrointestinal: Negative for abdominal  pain, constipation and heartburn.  Musculoskeletal: Negative for back pain, joint pain and myalgias.  Skin: Negative.   Neurological: Negative for dizziness.  Psychiatric/Behavioral: The patient is not nervous/anxious.     Physical Exam Constitutional:      General: She is not in acute distress.    Appearance: She is well-developed. She is obese. She is not diaphoretic.  Neck:     Thyroid: No thyromegaly.  Cardiovascular:     Rate and Rhythm: Normal rate and regular rhythm.     Pulses: Normal pulses.     Heart sounds: Normal heart sounds.  Pulmonary:     Effort: Pulmonary effort is normal. No respiratory distress.     Breath sounds: Normal breath sounds.     Comments: 02 dependent  Abdominal:     General: Bowel sounds are normal. There is no distension.     Palpations: Abdomen is soft.     Tenderness: There is no abdominal tenderness.  Musculoskeletal:        General: Normal range of motion.     Cervical back: Neck supple.     Right lower leg: Edema present.     Left lower leg: Edema present.  Lymphadenopathy:     Cervical: No cervical adenopathy.  Skin:    General: Skin is warm and dry.  Neurological:     Mental Status: She is alert and oriented to person, place, and time.  Psychiatric:        Mood and Affect: Mood normal.        ASSESSMENT/ PLAN:  TODAY  1. Acute on chronic diastolic (congestive) heart failure: EF 60-65% (07-28-19) is presently stable will continue lasix 40 mg daily  2. Acute respiratory with hypoxia: is stable will continue is 02 dependent was not dependent prior to hospitalization will monitor  3. Chronic afib with RVR will continue amiodarone 200 mg daily and cardizem cd 120 gm daily for rate control is on eliquis 2.5 mg twice daily     PREVIOUS   4. Cerebrovascular accident (CVA) due to embolism of right posterior cerebral artery: is stable will continue eliquis 2.5 mg twice daily   5. Type 2 diabetes mellitus with other circulatory  complication with long term current use of insulin: is without change: hgb a1c 8.4 will continue lantus 30 units nightly and novolog 10 units with meals.   6. Hyperlipidemia associated with type 2 diabetes mellitus: is stable will continue lipitor 40 mg daily   7. CKD stage 4 due to type 2 diabetes mellitus: is stable bun 51; creat 2.81   8. GERD without esophagitis: is stable will continue protonix 40 mg daily   9. Chronic constipation: is worse: will begin colace daily   10. Major depression recurrent chronic: is stable will continue celexa 20 mg daily wellbutrin xl 300 mg daily trazodone 150 mg nightly   11. Hypomagnesemia: is stable mag 2.2 will continue magox 500 mg daily   12. Chronic generalized pain: will continue tylenol 1 gm nightly      MD is aware of resident's narcotic use and is in agreement with current plan of care. We will attempt to wean resident as appropriate.  Ok Edwards NP [facility]  Contact [phone] Monday through Friday 8am- 5pm  After hours call [phone]

## 2019-08-17 ENCOUNTER — Non-Acute Institutional Stay (SKILLED_NURSING_FACILITY): Payer: Medicare HMO | Admitting: Adult Health

## 2019-08-17 ENCOUNTER — Encounter: Payer: Self-pay | Admitting: Adult Health

## 2019-08-17 ENCOUNTER — Other Ambulatory Visit (HOSPITAL_COMMUNITY)
Admission: RE | Admit: 2019-08-17 | Discharge: 2019-08-17 | Disposition: A | Discharge status: Home / Self Care | Payer: Medicare HMO | Source: Skilled Nursing Facility | Attending: Adult Health | Admitting: Adult Health

## 2019-08-17 DIAGNOSIS — I5033 Acute on chronic diastolic (congestive) heart failure: Secondary | ICD-10-CM | POA: Diagnosis not present

## 2019-08-17 LAB — BASIC METABOLIC PANEL
Anion gap: 11 (ref 5–15)
BUN: 43 mg/dL — ABNORMAL HIGH (ref 8–23)
CO2: 31 mmol/L (ref 22–32)
Calcium: 9.4 mg/dL (ref 8.9–10.3)
Chloride: 91 mmol/L — ABNORMAL LOW (ref 98–111)
Creatinine, Ser: 2.71 mg/dL — ABNORMAL HIGH (ref 0.44–1.00)
GFR calc Af Amer: 18 mL/min — ABNORMAL LOW (ref >60)
GFR calc non Af Amer: 15 mL/min — ABNORMAL LOW (ref >60)
Glucose, Bld: 187 mg/dL — ABNORMAL HIGH (ref 70–99)
Potassium: 3.5 mmol/L (ref 3.5–5.1)
Sodium: 133 mmol/L — ABNORMAL LOW (ref 135–145)

## 2019-08-17 NOTE — Progress Notes (Signed)
Location:    [place] Room Number: 128/P Place of Service:  SNF (31)   CODE STATUS: DNR  No Known Allergies  Chief Complaint  Patient presents with   Acute Visit    Weight Gain    HPI:  She is gaining weight from 08-04-19: 193 pounds to her current weight on 08-17-19 201 pounds. She denies any shortness of breath; chest pain or palpitations. No reports of poor 02 sats with her 02. No reports of any uncontrolled pain.   Past Medical History:  Diagnosis Date   Chronic anticoagulation    Depression    Diabetes mellitus    Hyperlipidemia    Hypertension    PAF (paroxysmal atrial fibrillation) ([facility])    PFO (patent foramen ovale)    Stroke ([facility])    TIA's x 2    Past Surgical History:  Procedure Laterality Date   COLONOSCOPY     COLONOSCOPY N/A 09/09/2014   Procedure: COLONOSCOPY;  Surgeon: Rogene Houston, MD;  [facility];  Service: Endoscopy;  Laterality: N/A;  1010 - moved to 7:30 - Ann to notify   EP IMPLANTABLE DEVICE N/A 06/07/2015   Procedure: Loop Recorder Insertion;  Surgeon: Deboraha Sprang, MD;  Location: [place] [facility];  Service: Cardiovascular;  Laterality: N/A;   TEE WITHOUT CARDIOVERSION N/A 06/07/2015   Procedure: TRANSESOPHAGEAL ECHOCARDIOGRAM (TEE);  Surgeon: Thayer Headings, MD;  Location: [facility] ENDOSCOPY;  Service: Cardiovascular;  Laterality: N/A;    Social History   Socioeconomic History   Marital status: Widowed    Spouse name: Not on file   Number of children: Not on file   Years of education: Not on file   Highest education level: Not on file  Occupational History   Not on file  Tobacco Use   Smoking status: Never Smoker   Smokeless tobacco: Never Used  Vaping Use   Vaping Use: Never used  Substance and Sexual Activity   Alcohol use: No   Drug use: No   Sexual activity: Never  Other Topics Concern   Not on file  Social History Narrative   Not on file   Social Determinants of  Health   Financial Resource Strain:    Difficulty of Paying Living Expenses:   Food Insecurity:    Worried About Charity fundraiser in the Last Year:    Arboriculturist in the Last Year:   Transportation Needs:    Film/video editor (Medical):    Lack of Transportation (Non-Medical):   Physical Activity:    Days of Exercise per Week:    Minutes of Exercise per Session:   Stress:    Feeling of Stress :   Social Connections:    Frequency of Communication with Friends and Family:    Frequency of Social Gatherings with Friends and Family:    Attends Religious Services:    Active Member of Clubs or Organizations:    Attends Music therapist:    Marital Status:   Intimate Partner Violence:    Fear of Current or Ex-Partner:    Emotionally Abused:    Physically Abused:    Sexually Abused:    Family History  Problem Relation Age of Onset   Stroke Father    Dementia Sister       VITAL SIGNS BP (!) 162/64    Pulse 72    Temp 97.9 F (36.6 C) (Oral)    Resp 20  Ht 5\' 4"  (1.626 m)    Wt 199 lb 6.4 oz (90.4 kg)    SpO2 92%    BMI 34.23 kg/m   Outpatient Encounter Medications as of 08/17/2019  Medication Sig   acetaminophen (TYLENOL) 500 MG tablet Take 1,000 mg by mouth at bedtime. may also take 2 tablets during the day as needed for pain   amiodarone (PACERONE) 200 MG tablet Take 1 tablet (200 mg total) by mouth daily.   atorvastatin (LIPITOR) 20 MG tablet Take 2 tablets (40 mg total) by mouth every evening. 4pm   buPROPion (WELLBUTRIN XL) 300 MG 24 hr tablet Take 300 mg by mouth daily.   citalopram (CELEXA) 20 MG tablet Take 20 mg by mouth daily.    diltiazem (CARDIZEM CD) 120 MG 24 hr capsule Take 1 capsule (120 mg total) by mouth daily.   docusate sodium (COLACE) 100 MG capsule Take 100 mg by mouth daily.   ELIQUIS 2.5 MG TABS tablet Take 2.5 mg by mouth 2 (two) times daily.    furosemide (LASIX) 40 MG tablet Take 1 tablet (40 mg  total) by mouth daily.   insulin aspart (NOVOLOG FLEXPEN) 100 UNIT/ML FlexPen insulin pen; 100 unit/mL (3 mL); amt: 10 units; subcutaneous Special Instructions: admin 5 units for CBG >150 Before Meals 08:00 AM, 11:30 AM, 05:00 PM   insulin glargine (LANTUS) 100 UNIT/ML injection Inject 30 Units into the skin at bedtime.    Magnesium Oxide 250 MG TABS Take 500 mg by mouth every evening.    Multiple Vitamin (MULTIVITAMIN WITH MINERALS) TABS tablet Take 1 tablet by mouth at bedtime. For supplement   Multiple Vitamins-Minerals (PRESERVISION AREDS 2) CAPS Take 1 capsule by mouth 2 (two) times daily.   NON FORMULARY Diet: _____ Regular, ___X___ NAS, _______Consistent Carbohydrate, _______NPO _____Other   OXYGEN Inhale 2 L into the lungs continuous.   pantoprazole (PROTONIX) 40 MG tablet Take 40 mg by mouth daily. For acid reflux/gerd   potassium chloride SA (KLOR-CON) 20 MEQ tablet Take 20 mEq by mouth daily.   traZODone (DESYREL) 100 MG tablet Take 75 mg by mouth at bedtime.    No facility-administered encounter medications on file as of 08/17/2019.     SIGNIFICANT DIAGNOSTIC EXAMS   PREVIOUS  07-24-19: chest x-ray: Bilateral pulmonary opacities may represent edema or pneumonia. Clinical correlation is recommended.  07-27-19: chest x-ray: Continued improvement in aeration in the right chest. Trace bilateral pleural effusions. Cardiomegaly. Aortic Atherosclerosis   07-28-19: 2-d echo:  Left ventricular ejection fraction, by estimation, is 60 to 65%. The  left ventricle has normal function. The left ventricle has no regional wall motion abnormalities. There is moderate left ventricular hypertrophy.  Left ventricular diastolic  parameters are consistent with Grade II diastolic dysfunction  (pseudonormalization). Elevated left atrial pressure.   07-30-19: chest x-ray: Slight improvement in background pulmonary edema from earlier this day, however there is worsening right suprahilar patchy  opacity, may be developing asymmetric pulmonary edema or pneumonia. Given relative rapid progression, aspiration is considered. Recommend correlation for aspiration risk factors  07-31-19: ct of chest:  1. There is extensive irregular bilateral ground-glass airspace opacity, most conspicuous in the right upper lobe. These findings are new compared to prior CT dated 09/29/2018 and most consistent with multifocal infection. 2. There is mild underlying tubular bronchiectasis throughout. 3. Small bilateral pleural effusions and associated atelectasis or consolidation. 4. Coronary artery disease.  Aortic Atherosclerosis   NO NEW EXAMS.    LABS REVIEWED PREVIOUS;   07-24-19:  wbc 13.3; hgb 9.0; hct 31.3; mcv 89.7 plt 422; glucose 205; bun 41; creat 2.50; k+ 4.1; na++ 131; ca 9.6 BNP 365.0 07-25-19: hgb a1c 8.4 07-27-19: wbc 8.9; hct 7.7; hct 26.5; mcv 88.9 plt 392; glucose 144; bun 47; creat 2.83; k+ 3.4; na++ 135; ca 9.0; mag 1.8 07-31-19; wbc 8.3; hgb 7.3; hct 25.3; mcv 869. plt 418; glucose 199; bun 51; creat 2.55; k+ 2.7; na++ 135; ca 9.0 mag 1.7; vit B 12: 577; folate 8.0; tsh 3.105; free t4: 0.97; ferritin 36; iron 106; tibc 331 08-01-19 wbc 9.2; hgb 7.2; hct 25.1; mcv 86.6 plt 455; glucose 277; bun 46; creat 2.73; k+ 4.8; na++ 133; ca 9.0 mag 2.5 cortisol 11.9 08-03-19: glucose 237; bun 51; creat 2.81; k+ 4.4; na++ 134; ca 9.1 mag 2.2  08-09-19: glucose 300; bun 48; creat 2.95; k+ 4.3; na++ 132; ca 9.3  NO NEW LABS.     Review of Systems  Constitutional: Negative for malaise/fatigue.  Respiratory: Negative for cough and shortness of breath.   Cardiovascular: Positive for leg swelling. Negative for chest pain and palpitations.  Gastrointestinal: Negative for abdominal pain, constipation and heartburn.  Musculoskeletal: Negative for back pain, joint pain and myalgias.  Skin: Negative.   Neurological: Negative for dizziness.  Psychiatric/Behavioral: The patient is not nervous/anxious.    Physical  Exam Constitutional:      General: She is not in acute distress.    Appearance: She is well-developed. She is obese. She is not diaphoretic.  Neck:     Thyroid: No thyromegaly.  Cardiovascular:     Rate and Rhythm: Normal rate and regular rhythm.     Pulses: Normal pulses.     Heart sounds: Normal heart sounds.  Pulmonary:     Effort: Pulmonary effort is normal. No respiratory distress.     Comments: 02 dependent; slightly diminished bases  Abdominal:     General: Bowel sounds are normal. There is no distension.     Palpations: Abdomen is soft.     Tenderness: There is no abdominal tenderness.  Musculoskeletal:        General: Normal range of motion.     Cervical back: Neck supple.     Right lower leg: Edema present.     Left lower leg: Edema present.  Lymphadenopathy:     Cervical: No cervical adenopathy.  Skin:    General: Skin is warm and dry.  Neurological:     Mental Status: She is alert and oriented to person, place, and time.  Psychiatric:        Mood and Affect: Mood normal.       ASSESSMENT/ PLAN:  TODAY  1.Acute  on chronic diastolic (congestive) heart failure: EF 60-65% (07-28-19) will continue lasix 40 mg daily will give an extra 40 mg now with k+ 20 meq will check BMP on 08-19-19.  Will monitor her status.   MD is aware of resident's narcotic use and is in agreement with current plan of care. We will attempt to wean resident as appropriate.  Ok Edwards NP [facility]  Contact [phone] Monday through Friday 8am- 5pm  After hours call [phone]

## 2019-08-18 ENCOUNTER — Encounter: Payer: Self-pay | Admitting: Adult Health

## 2019-08-18 ENCOUNTER — Non-Acute Institutional Stay (SKILLED_NURSING_FACILITY): Payer: Medicare HMO | Admitting: Adult Health

## 2019-08-18 DIAGNOSIS — E1159 Type 2 diabetes mellitus with other circulatory complications: Secondary | ICD-10-CM

## 2019-08-18 DIAGNOSIS — I63431 Cerebral infarction due to embolism of right posterior cerebral artery: Secondary | ICD-10-CM | POA: Diagnosis not present

## 2019-08-18 DIAGNOSIS — Z794 Long term (current) use of insulin: Secondary | ICD-10-CM

## 2019-08-18 DIAGNOSIS — I5033 Acute on chronic diastolic (congestive) heart failure: Secondary | ICD-10-CM

## 2019-08-18 NOTE — Progress Notes (Signed)
Location:    [facility] [place] Room Number: 128/p Place of Service:  SNF (31)   CODE STATUS: DNR  No Known Allergies  Chief Complaint  Patient presents with  . Short Term Rehab        Acute on chronic diastolic (congestive) heart failure:   Cerebrovascular accident due to embolism of right posterior cerebral artery:  Type 2 diabetes mellitus with other circulatory complication with long term current use of insulin:  Weekly follow up for the first 30 days post hospitalization.     HPI:  She is a 86 year old short term rehab patient being seen for the management of her chronic illnesses: chf; cva; diabetes. She received an extra dose of lasix due to her weight gain. She does have lower extremity edema and has does have shortness of breath present. There are no reports of uncontrolled pain.   Past Medical History:  Diagnosis Date  . Chronic anticoagulation   . Depression   . Diabetes mellitus   . Hyperlipidemia   . Hypertension   . PAF (paroxysmal atrial fibrillation) ([place])   . PFO (patent foramen ovale)   . Stroke ([place])    TIA's x 2    Past Surgical History:  Procedure Laterality Date  . COLONOSCOPY    . COLONOSCOPY N/A 09/09/2014   Procedure: COLONOSCOPY;  Surgeon: Rogene Houston, MD;  [facility];  Service: Endoscopy;  Laterality: N/A;  1010 - moved to 7:30 - Ann to notify  . EP IMPLANTABLE DEVICE N/A 06/07/2015   Procedure: Loop Recorder Insertion;  Surgeon: Deboraha Sprang, MD;  Location: [place] CV LAB;  Service: Cardiovascular;  Laterality: N/A;  . TEE WITHOUT CARDIOVERSION N/A 06/07/2015   Procedure: TRANSESOPHAGEAL ECHOCARDIOGRAM (TEE);  Surgeon: Thayer Headings, MD;  Location: [facility] ENDOSCOPY;  Service: Cardiovascular;  Laterality: N/A;    Social History   Socioeconomic History  . Marital status: Widowed    Spouse name: Not on file  . Number of children: Not on file  . Years of education: Not on file  . Highest education level: Not  on file  Occupational History  . Not on file  Tobacco Use  . Smoking status: Never Smoker  . Smokeless tobacco: Never Used  Vaping Use  . Vaping Use: Never used  Substance and Sexual Activity  . Alcohol use: No  . Drug use: No  . Sexual activity: Never  Other Topics Concern  . Not on file  Social History Narrative  . Not on file   Social Determinants of Health   Financial Resource Strain:   . Difficulty of Paying Living Expenses:   Food Insecurity:   . Worried About Charity fundraiser in the Last Year:   . Arboriculturist in the Last Year:   Transportation Needs:   . Film/video editor (Medical):   Marland Kitchen Lack of Transportation (Non-Medical):   Physical Activity:   . Days of Exercise per Week:   . Minutes of Exercise per Session:   Stress:   . Feeling of Stress :   Social Connections:   . Frequency of Communication with Friends and Family:   . Frequency of Social Gatherings with Friends and Family:   . Attends Religious Services:   . Active Member of Clubs or Organizations:   . Attends Archivist Meetings:   Marland Kitchen Marital Status:   Intimate Partner Violence:   . Fear of Current or Ex-Partner:   .  Emotionally Abused:   Marland Kitchen Physically Abused:   . Sexually Abused:    Family History  Problem Relation Age of Onset  . Stroke Father   . Dementia Sister       VITAL SIGNS BP 131/79   Pulse (!) 102   Temp 97.6 F (36.4 C) (Oral)   Resp 20   Ht 5\' 4"  (1.626 m)   Wt 201 lb 12.8 oz (91.5 kg)   SpO2 94%   BMI 34.64 kg/m   Outpatient Encounter Medications as of 08/18/2019  Medication Sig  . acetaminophen (TYLENOL) 500 MG tablet Take 1,000 mg by mouth at bedtime. may also take 2 tablets during the day as needed for pain  . amiodarone (PACERONE) 200 MG tablet Take 1 tablet (200 mg total) by mouth daily.  Marland Kitchen atorvastatin (LIPITOR) 20 MG tablet Take 2 tablets (40 mg total) by mouth every evening. 4pm  . buPROPion (WELLBUTRIN XL) 300 MG 24 hr tablet Take 300 mg by  mouth daily.  . citalopram (CELEXA) 20 MG tablet Take 20 mg by mouth daily.   Marland Kitchen diltiazem (CARDIZEM CD) 120 MG 24 hr capsule Take 1 capsule (120 mg total) by mouth daily.  Marland Kitchen docusate sodium (COLACE) 100 MG capsule Take 100 mg by mouth daily.  Marland Kitchen ELIQUIS 2.5 MG TABS tablet Take 2.5 mg by mouth 2 (two) times daily.   . furosemide (LASIX) 40 MG tablet Take 1 tablet (40 mg total) by mouth daily.  . insulin aspart (NOVOLOG FLEXPEN) 100 UNIT/ML FlexPen insulin pen; 100 unit/mL (3 mL); amt: 10 units; subcutaneous Special Instructions: admin 5 units for CBG >150 Before Meals 08:00 AM, 11:30 AM, 05:00 PM  . insulin glargine (LANTUS) 100 UNIT/ML injection Inject 30 Units into the skin at bedtime.   . Magnesium Oxide 250 MG TABS Take 500 mg by mouth every evening.   . Multiple Vitamin (MULTIVITAMIN WITH MINERALS) TABS tablet Take 1 tablet by mouth at bedtime. For supplement  . Multiple Vitamins-Minerals (PRESERVISION AREDS 2) CAPS Take 1 capsule by mouth 2 (two) times daily.  . NON FORMULARY Diet: _____ Regular, ___X___ NAS, _______Consistent Carbohydrate, _______NPO _____Other  . OXYGEN Inhale 2 L into the lungs continuous.  . pantoprazole (PROTONIX) 40 MG tablet Take 40 mg by mouth daily. For acid reflux/gerd  . potassium chloride SA (KLOR-CON) 20 MEQ tablet Take 20 mEq by mouth daily.  . traZODone (DESYREL) 100 MG tablet Take 75 mg by mouth at bedtime.    No facility-administered encounter medications on file as of 08/18/2019.     SIGNIFICANT DIAGNOSTIC EXAMS   PREVIOUS  07-24-19: chest x-ray: Bilateral pulmonary opacities may represent edema or pneumonia. Clinical correlation is recommended.  07-27-19: chest x-ray: Continued improvement in aeration in the right chest. Trace bilateral pleural effusions. Cardiomegaly. Aortic Atherosclerosis   07-28-19: 2-d echo:  Left ventricular ejection fraction, by estimation, is 60 to 65%. The  left ventricle has normal function. The left ventricle has no  regional wall motion abnormalities. There is moderate left ventricular hypertrophy.  Left ventricular diastolic  parameters are consistent with Grade II diastolic dysfunction  (pseudonormalization). Elevated left atrial pressure.   07-30-19: chest x-ray: Slight improvement in background pulmonary edema from earlier this day, however there is worsening right suprahilar patchy opacity, may be developing asymmetric pulmonary edema or pneumonia. Given relative rapid progression, aspiration is considered. Recommend correlation for aspiration risk factors  07-31-19: ct of chest:  1. There is extensive irregular bilateral ground-glass airspace opacity, most conspicuous in the  right upper lobe. These findings are new compared to prior CT dated 09/29/2018 and most consistent with multifocal infection. 2. There is mild underlying tubular bronchiectasis throughout. 3. Small bilateral pleural effusions and associated atelectasis or consolidation. 4. Coronary artery disease.  Aortic Atherosclerosis   NO NEW EXAMS.    LABS REVIEWED PREVIOUS;   07-24-19: wbc 13.3; hgb 9.0; hct 31.3; mcv 89.7 plt 422; glucose 205; bun 41; creat 2.50; k+ 4.1; na++ 131; ca 9.6 BNP 365.0 07-25-19: hgb a1c 8.4 07-27-19: wbc 8.9; hct 7.7; hct 26.5; mcv 88.9 plt 392; glucose 144; bun 47; creat 2.83; k+ 3.4; na++ 135; ca 9.0; mag 1.8 07-31-19; wbc 8.3; hgb 7.3; hct 25.3; mcv 869. plt 418; glucose 199; bun 51; creat 2.55; k+ 2.7; na++ 135; ca 9.0 mag 1.7; vit B 12: 577; folate 8.0; tsh 3.105; free t4: 0.97; ferritin 36; iron 106; tibc 331 08-01-19 wbc 9.2; hgb 7.2; hct 25.1; mcv 86.6 plt 455; glucose 277; bun 46; creat 2.73; k+ 4.8; na++ 133; ca 9.0 mag 2.5 cortisol 11.9 08-03-19: glucose 237; bun 51; creat 2.81; k+ 4.4; na++ 134; ca 9.1 mag 2.2  08-09-19: glucose 300; bun 48; creat 2.95; k+ 4.3; na++ 132; ca 9.3  TODAY  08-17-19: glucose 187; bun 43; creat 2.71; k+ 3.5; na++ 133; ca 9.4   Review of Systems  Constitutional: Negative for  malaise/fatigue.  Respiratory: Positive for shortness of breath. Negative for cough.   Cardiovascular: Positive for leg swelling. Negative for chest pain and palpitations.  Gastrointestinal: Negative for abdominal pain, constipation and heartburn.  Musculoskeletal: Negative for back pain, joint pain and myalgias.  Skin: Negative.   Neurological: Negative for dizziness.  Psychiatric/Behavioral: The patient is not nervous/anxious.     Physical Exam Constitutional:      General: She is not in acute distress.    Appearance: She is well-developed. She is obese. She is not diaphoretic.  Neck:     Thyroid: No thyromegaly.  Cardiovascular:     Rate and Rhythm: Normal rate and regular rhythm.     Pulses: Normal pulses.     Heart sounds: Normal heart sounds.  Pulmonary:     Effort: Pulmonary effort is normal. No respiratory distress.     Comments: 02 dependent; slightly diminished bases  Abdominal:     General: Bowel sounds are normal. There is no distension.     Palpations: Abdomen is soft.     Tenderness: There is no abdominal tenderness.  Musculoskeletal:        General: Normal range of motion.     Cervical back: Neck supple.     Right lower leg: Edema present.     Left lower leg: Edema present.  Lymphadenopathy:     Cervical: No cervical adenopathy.  Skin:    General: Skin is warm and dry.  Neurological:     Mental Status: She is alert and oriented to person, place, and time.  Psychiatric:        Mood and Affect: Mood normal.         ASSESSMENT/ PLAN:  TODAY  1. Acute on chronic diastolic (congestive) heart failure: EF 60-65% (07-28-19) is stable will continue lasix 40 mg daily has lost from 201 to her current weight of 199 pounds will monitor her status.   2. Cerebrovascular accident due to embolism of right posterior cerebral artery: is stable will continue eliquis 2.5 mg twice daily   3. Type 2 diabetes mellitus with other circulatory complication with long term  current use of insulin: is stable hgb a1c 8.4 will continue lantus 30 units with meals and novlog 10 units with meals.    PREVIOUS   4. Hyperlipidemia associated with type 2 diabetes mellitus: is stable will continue lipitor 40 mg daily   5. CKD stage 4 due to type 2 diabetes mellitus: is stable bun 43; creat 2.71   6. GERD without esophagitis: is stable will continue protonix 40 mg daily   7. Chronic constipation: is worse: will begin colace daily   8. Major depression recurrent chronic: is stable will continue celexa 20 mg daily wellbutrin xl 300 mg daily trazodone 150 mg nightly   9. Hypomagnesemia: is stable mag 2.2 will continue magox 500 mg daily   10. Chronic generalized pain: will continue tylenol 1 gm nightly   11. Acute respiratory with hypoxia: is stable will continue is 02 dependent was not dependent prior to hospitalization will monitor  12. Chronic afib with RVR will continue amiodarone 200 mg daily and cardizem cd 120 gm daily for rate control is on eliquis 2.5 mg twice daily      MD is aware of resident's narcotic use and is in agreement with current plan of care. We will attempt to wean resident as appropriate.  Ok Edwards NP [facility]  Contact [phone] Monday through Friday 8am- 5pm  After hours call [phone]

## 2019-08-20 ENCOUNTER — Non-Acute Institutional Stay (SKILLED_NURSING_FACILITY): Payer: Medicare HMO | Admitting: Adult Health

## 2019-08-20 ENCOUNTER — Encounter: Payer: Self-pay | Admitting: Adult Health

## 2019-08-20 DIAGNOSIS — E1122 Type 2 diabetes mellitus with diabetic chronic kidney disease: Secondary | ICD-10-CM

## 2019-08-20 DIAGNOSIS — J9601 Acute respiratory failure with hypoxia: Secondary | ICD-10-CM | POA: Diagnosis not present

## 2019-08-20 DIAGNOSIS — N184 Chronic kidney disease, stage 4 (severe): Secondary | ICD-10-CM | POA: Diagnosis not present

## 2019-08-20 DIAGNOSIS — I5033 Acute on chronic diastolic (congestive) heart failure: Secondary | ICD-10-CM | POA: Diagnosis not present

## 2019-08-20 NOTE — Progress Notes (Signed)
Location:  [place] Room Number: 128-P Place of Service:  SNF (31)   CODE STATUS: DNR  No Known Allergies  Chief Complaint  Patient presents with  . Acute Visit    Patient is seen for a Care Plan Meeting    HPI:  We have come together for her care plan meeting. Family present. BIMS 12/15 mood 4/30. She has had one fall with a skin tear. There is no significant change in her weight. She has poor endurance; and has declined therapy once. She requires supervision with her upper body and assist with her lower body adls. She is occasionally incontinent of bladder. Is able to ambulate 40 feet. Her 02 new. Her goal is to return back home. There are no reports of uncontrolled pain. She continues to be followed for her chronic illnesses including: Acute on chronic congestive heart failure   Acute respiratory failure with hypoxia   CKD stage 4 due to type 2 diabetes mellitus  Past Medical History:  Diagnosis Date  . Chronic anticoagulation   . Depression   . Diabetes mellitus   . Hyperlipidemia   . Hypertension   . PAF (paroxysmal atrial fibrillation) ([place])   . PFO (patent foramen ovale)   . Stroke ([place])    TIA's x 2    Past Surgical History:  Procedure Laterality Date  . COLONOSCOPY    . COLONOSCOPY N/A 09/09/2014   Procedure: COLONOSCOPY;  Surgeon: Rogene Houston, MD;  [facility];  Service: Endoscopy;  Laterality: N/A;  1010 - moved to 7:30 - Ann to notify  . EP IMPLANTABLE DEVICE N/A 06/07/2015   Procedure: Loop Recorder Insertion;  Surgeon: Deboraha Sprang, MD;  Location: [place] CV LAB;  Service: Cardiovascular;  Laterality: N/A;  . TEE WITHOUT CARDIOVERSION N/A 06/07/2015   Procedure: TRANSESOPHAGEAL ECHOCARDIOGRAM (TEE);  Surgeon: Thayer Headings, MD;  Location: [facility] ENDOSCOPY;  Service: Cardiovascular;  Laterality: N/A;    Social History   Socioeconomic History  . Marital status: Widowed    Spouse name: Not on file  . Number of  children: Not on file  . Years of education: Not on file  . Highest education level: Not on file  Occupational History  . Not on file  Tobacco Use  . Smoking status: Never Smoker  . Smokeless tobacco: Never Used  Vaping Use  . Vaping Use: Never used  Substance and Sexual Activity  . Alcohol use: No  . Drug use: No  . Sexual activity: Never  Other Topics Concern  . Not on file  Social History Narrative  . Not on file   Social Determinants of Health   Financial Resource Strain:   . Difficulty of Paying Living Expenses:   Food Insecurity:   . Worried About Charity fundraiser in the Last Year:   . Arboriculturist in the Last Year:   Transportation Needs:   . Film/video editor (Medical):   Marland Kitchen Lack of Transportation (Non-Medical):   Physical Activity:   . Days of Exercise per Week:   . Minutes of Exercise per Session:   Stress:   . Feeling of Stress :   Social Connections:   . Frequency of Communication with Friends and Family:   . Frequency of Social Gatherings with Friends and Family:   . Attends Religious Services:   . Active Member of Clubs or Organizations:   . Attends Archivist Meetings:   Marland Kitchen Marital Status:  Intimate Partner Violence:   . Fear of Current or Ex-Partner:   . Emotionally Abused:   Marland Kitchen Physically Abused:   . Sexually Abused:    Family History  Problem Relation Age of Onset  . Stroke Father   . Dementia Sister       VITAL SIGNS BP (!) 107/49   Pulse 91   Temp 98 F (36.7 C) (Oral)   Resp 20   Ht 5\' 4"  (1.626 m)   Wt 203 lb 3.2 oz (92.2 kg)   SpO2 96%   BMI 34.88 kg/m   Outpatient Encounter Medications as of 08/20/2019  Medication Sig  . acetaminophen (TYLENOL) 500 MG tablet Take 1,000 mg by mouth at bedtime. may also take 2 tablets during the day as needed for pain  . amiodarone (PACERONE) 200 MG tablet Take 1 tablet (200 mg total) by mouth daily.  Marland Kitchen atorvastatin (LIPITOR) 20 MG tablet Take 2 tablets (40 mg total) by  mouth every evening. 4pm  . buPROPion (WELLBUTRIN XL) 300 MG 24 hr tablet Take 300 mg by mouth daily.  . citalopram (CELEXA) 20 MG tablet Take 20 mg by mouth daily.   Marland Kitchen diltiazem (CARDIZEM CD) 120 MG 24 hr capsule Take 1 capsule (120 mg total) by mouth daily.  Marland Kitchen docusate sodium (COLACE) 100 MG capsule Take 100 mg by mouth daily.  Marland Kitchen ELIQUIS 2.5 MG TABS tablet Take 2.5 mg by mouth 2 (two) times daily.   . furosemide (LASIX) 40 MG tablet Take 1 tablet (40 mg total) by mouth daily.  . insulin aspart (NOVOLOG FLEXPEN) 100 UNIT/ML FlexPen insulin pen; 100 unit/mL (3 mL); amt: 10 units; subcutaneous Special Instructions: admin 5 units for CBG >150 Before Meals 08:00 AM, 11:30 AM, 05:00 PM  . insulin glargine (LANTUS) 100 UNIT/ML injection Inject 30 Units into the skin at bedtime.   . Magnesium Oxide 250 MG TABS Take 500 mg by mouth every evening.   . Multiple Vitamin (MULTIVITAMIN WITH MINERALS) TABS tablet Take 1 tablet by mouth at bedtime. For supplement  . Multiple Vitamins-Minerals (PRESERVISION AREDS 2) CAPS Take 1 capsule by mouth 2 (two) times daily.  . NON FORMULARY Diet: _____ Regular, ___X___ NAS, _______Consistent Carbohydrate, _______NPO _____Other  . OXYGEN Inhale 2 L into the lungs continuous.  . pantoprazole (PROTONIX) 40 MG tablet Take 40 mg by mouth daily. For acid reflux/gerd  . potassium chloride SA (KLOR-CON) 20 MEQ tablet Take 20 mEq by mouth daily.  . traZODone (DESYREL) 150 MG tablet Take 75 mg by mouth at bedtime. 0.5 tablet to = 75 mg  . [DISCONTINUED] traZODone (DESYREL) 100 MG tablet Take 75 mg by mouth at bedtime.    No facility-administered encounter medications on file as of 08/20/2019.     SIGNIFICANT DIAGNOSTIC EXAMS   PREVIOUS  07-24-19: chest x-ray: Bilateral pulmonary opacities may represent edema or pneumonia. Clinical correlation is recommended.  07-27-19: chest x-ray: Continued improvement in aeration in the right chest. Trace bilateral pleural  effusions. Cardiomegaly. Aortic Atherosclerosis   07-28-19: 2-d echo:  Left ventricular ejection fraction, by estimation, is 60 to 65%. The  left ventricle has normal function. The left ventricle has no regional wall motion abnormalities. There is moderate left ventricular hypertrophy.  Left ventricular diastolic  parameters are consistent with Grade II diastolic dysfunction  (pseudonormalization). Elevated left atrial pressure.   07-30-19: chest x-ray: Slight improvement in background pulmonary edema from earlier this day, however there is worsening right suprahilar patchy opacity, may be developing asymmetric pulmonary  edema or pneumonia. Given relative rapid progression, aspiration is considered. Recommend correlation for aspiration risk factors  07-31-19: ct of chest:  1. There is extensive irregular bilateral ground-glass airspace opacity, most conspicuous in the right upper lobe. These findings are new compared to prior CT dated 09/29/2018 and most consistent with multifocal infection. 2. There is mild underlying tubular bronchiectasis throughout. 3. Small bilateral pleural effusions and associated atelectasis or consolidation. 4. Coronary artery disease.  Aortic Atherosclerosis   NO NEW EXAMS.    LABS REVIEWED PREVIOUS;   07-24-19: wbc 13.3; hgb 9.0; hct 31.3; mcv 89.7 plt 422; glucose 205; bun 41; creat 2.50; k+ 4.1; na++ 131; ca 9.6 BNP 365.0 07-25-19: hgb a1c 8.4 07-27-19: wbc 8.9; hct 7.7; hct 26.5; mcv 88.9 plt 392; glucose 144; bun 47; creat 2.83; k+ 3.4; na++ 135; ca 9.0; mag 1.8 07-31-19; wbc 8.3; hgb 7.3; hct 25.3; mcv 869. plt 418; glucose 199; bun 51; creat 2.55; k+ 2.7; na++ 135; ca 9.0 mag 1.7; vit B 12: 577; folate 8.0; tsh 3.105; free t4: 0.97; ferritin 36; iron 106; tibc 331 08-01-19 wbc 9.2; hgb 7.2; hct 25.1; mcv 86.6 plt 455; glucose 277; bun 46; creat 2.73; k+ 4.8; na++ 133; ca 9.0 mag 2.5 cortisol 11.9 08-03-19: glucose 237; bun 51; creat 2.81; k+ 4.4; na++ 134; ca 9.1 mag 2.2    08-09-19: glucose 300; bun 48; creat 2.95; k+ 4.3; na++ 132; ca 9.3 08-17-19: glucose 187; bun 43; creat 2.71; k+ 3.5; na++ 133; ca 9.4  NO NEW LABS.    Review of Systems  Constitutional: Negative for malaise/fatigue.  Respiratory: Positive for shortness of breath. Negative for cough.   Cardiovascular: Positive for leg swelling. Negative for chest pain and palpitations.  Gastrointestinal: Negative for abdominal pain, constipation and heartburn.  Musculoskeletal: Negative for back pain, joint pain and myalgias.  Skin: Negative.   Neurological: Negative for dizziness.  Psychiatric/Behavioral: The patient is not nervous/anxious.     Physical Exam Constitutional:      General: She is not in acute distress.    Appearance: She is well-developed. She is obese. She is not diaphoretic.  Neck:     Thyroid: No thyromegaly.  Cardiovascular:     Rate and Rhythm: Normal rate and regular rhythm.     Pulses: Normal pulses.     Heart sounds: Normal heart sounds.  Pulmonary:     Effort: Pulmonary effort is normal. No respiratory distress.     Comments: 02 dependent; slightly diminished bases  Abdominal:     General: Bowel sounds are normal. There is no distension.     Palpations: Abdomen is soft.     Tenderness: There is no abdominal tenderness.  Musculoskeletal:        General: Normal range of motion.     Cervical back: Neck supple.     Right lower leg: Edema present.     Left lower leg: Edema present.     Comments: Moderate bilateral lower extremity edema   Lymphadenopathy:     Cervical: No cervical adenopathy.  Skin:    General: Skin is warm and dry.  Neurological:     Mental Status: She is alert and oriented to person, place, and time.  Psychiatric:        Mood and Affect: Mood normal.      ASSESSMENT/ PLAN:  TODAY  1. Acute on chronic congestive heart failure 2. Acute respiratory failure with hypoxia 3. CKD stage 4 due to type 2 diabetes mellitus  Will  continue therapy  as directed Will continue current plan of care Will continue current medications Her goal is to return back home Will continue to monitor her status.   MD is aware of resident's narcotic use and is in agreement with current plan of care. We will attempt to wean resident as appropriate.  Ok Edwards NP [facility]  Contact [phone] Monday through Friday 8am- 5pm  After hours call [phone]

## 2019-08-21 ENCOUNTER — Non-Acute Institutional Stay (SKILLED_NURSING_FACILITY): Payer: Medicare HMO | Admitting: Adult Health

## 2019-08-21 DIAGNOSIS — I5033 Acute on chronic diastolic (congestive) heart failure: Secondary | ICD-10-CM | POA: Diagnosis not present

## 2019-08-21 NOTE — Progress Notes (Signed)
Location:   [facility] Room Number: 409 WJXBJ of Service:  SNF (31)   CODE STATUS: dnr  No Known Allergies  Chief Complaint  Patient presents with  . Acute Visit    weight gain     HPI:  She has been gaining weight from 194.8 pounds on 08-13-19 to her current weight of 199.4 pounds. She denies any cough or shortness of breath. No reports of chest pain present. She is presently taking lasix 40 mg daily    Past Medical History:  Diagnosis Date  . Chronic anticoagulation   . Depression   . Diabetes mellitus   . Hyperlipidemia   . Hypertension   . PAF (paroxysmal atrial fibrillation) ([place])   . PFO (patent foramen ovale)   . Stroke ([place])    TIA's x 2    Past Surgical History:  Procedure Laterality Date  . COLONOSCOPY    . COLONOSCOPY N/A 09/09/2014   Procedure: COLONOSCOPY;  Surgeon: Rogene Houston, MD;  [facility];  Service: Endoscopy;  Laterality: N/A;  1010 - moved to 7:30 - Ann to notify  . EP IMPLANTABLE DEVICE N/A 06/07/2015   Procedure: Loop Recorder Insertion;  Surgeon: Deboraha Sprang, MD;  Location: [place] CV LAB;  Service: Cardiovascular;  Laterality: N/A;  . TEE WITHOUT CARDIOVERSION N/A 06/07/2015   Procedure: TRANSESOPHAGEAL ECHOCARDIOGRAM (TEE);  Surgeon: Thayer Headings, MD;  Location: [facility] ENDOSCOPY;  Service: Cardiovascular;  Laterality: N/A;    Social History   Socioeconomic History  . Marital status: Widowed    Spouse name: Not on file  . Number of children: Not on file  . Years of education: Not on file  . Highest education level: Not on file  Occupational History  . Not on file  Tobacco Use  . Smoking status: Never Smoker  . Smokeless tobacco: Never Used  Vaping Use  . Vaping Use: Never used  Substance and Sexual Activity  . Alcohol use: No  . Drug use: No  . Sexual activity: Never  Other Topics Concern  . Not on file  Social History Narrative  . Not on file   Social Determinants of Health   Financial  Resource Strain:   . Difficulty of Paying Living Expenses:   Food Insecurity:   . Worried About Charity fundraiser in the Last Year:   . Arboriculturist in the Last Year:   Transportation Needs:   . Film/video editor (Medical):   Marland Kitchen Lack of Transportation (Non-Medical):   Physical Activity:   . Days of Exercise per Week:   . Minutes of Exercise per Session:   Stress:   . Feeling of Stress :   Social Connections:   . Frequency of Communication with Friends and Family:   . Frequency of Social Gatherings with Friends and Family:   . Attends Religious Services:   . Active Member of Clubs or Organizations:   . Attends Archivist Meetings:   Marland Kitchen Marital Status:   Intimate Partner Violence:   . Fear of Current or Ex-Partner:   . Emotionally Abused:   Marland Kitchen Physically Abused:   . Sexually Abused:    Family History  Problem Relation Age of Onset  . Stroke Father   . Dementia Sister       VITAL SIGNS BP 138/85   Pulse 85   Temp 98.1 F (36.7 C)   Ht 5\' 4"  (1.626 m)   Wt 199 lb 6.4  oz (90.4 kg)   BMI 34.23 kg/m   Outpatient Encounter Medications as of 08/21/2019  Medication Sig  . acetaminophen (TYLENOL) 500 MG tablet Take 1,000 mg by mouth at bedtime. may also take 2 tablets during the day as needed for pain  . amiodarone (PACERONE) 200 MG tablet Take 1 tablet (200 mg total) by mouth daily.  Marland Kitchen atorvastatin (LIPITOR) 20 MG tablet Take 2 tablets (40 mg total) by mouth every evening. 4pm  . buPROPion (WELLBUTRIN XL) 300 MG 24 hr tablet Take 300 mg by mouth daily.  . citalopram (CELEXA) 20 MG tablet Take 20 mg by mouth daily.   Marland Kitchen diltiazem (CARDIZEM CD) 120 MG 24 hr capsule Take 1 capsule (120 mg total) by mouth daily.  Marland Kitchen docusate sodium (COLACE) 100 MG capsule Take 100 mg by mouth daily.  Marland Kitchen ELIQUIS 2.5 MG TABS tablet Take 2.5 mg by mouth 2 (two) times daily.   . furosemide (LASIX) 40 MG tablet Take 1 tablet (40 mg total) by mouth daily.  . insulin aspart (NOVOLOG  FLEXPEN) 100 UNIT/ML FlexPen insulin pen; 100 unit/mL (3 mL); amt: 10 units; subcutaneous Special Instructions: admin 5 units for CBG >150 Before Meals 08:00 AM, 11:30 AM, 05:00 PM  . insulin glargine (LANTUS) 100 UNIT/ML injection Inject 30 Units into the skin at bedtime.   . Magnesium Oxide 250 MG TABS Take 500 mg by mouth every evening.   . Multiple Vitamin (MULTIVITAMIN WITH MINERALS) TABS tablet Take 1 tablet by mouth at bedtime. For supplement  . Multiple Vitamins-Minerals (PRESERVISION AREDS 2) CAPS Take 1 capsule by mouth 2 (two) times daily.  . NON FORMULARY Diet: _____ Regular, ___X___ NAS, _______Consistent Carbohydrate, _______NPO _____Other  . OXYGEN Inhale 2 L into the lungs continuous.  . pantoprazole (PROTONIX) 40 MG tablet Take 40 mg by mouth daily. For acid reflux/gerd  . potassium chloride SA (KLOR-CON) 20 MEQ tablet Take 20 mEq by mouth daily.  . traZODone (DESYREL) 150 MG tablet Take 75 mg by mouth at bedtime. 0.5 tablet to = 75 mg   No facility-administered encounter medications on file as of 08/21/2019.     SIGNIFICANT DIAGNOSTIC EXAMS   PREVIOUS  07-24-19: chest x-ray: Bilateral pulmonary opacities may represent edema or pneumonia. Clinical correlation is recommended.  07-27-19: chest x-ray: Continued improvement in aeration in the right chest. Trace bilateral pleural effusions. Cardiomegaly. Aortic Atherosclerosis   07-28-19: 2-d echo:  Left ventricular ejection fraction, by estimation, is 60 to 65%. The  left ventricle has normal function. The left ventricle has no regional wall motion abnormalities. There is moderate left ventricular hypertrophy.  Left ventricular diastolic  parameters are consistent with Grade II diastolic dysfunction  (pseudonormalization). Elevated left atrial pressure.   07-30-19: chest x-ray: Slight improvement in background pulmonary edema from earlier this day, however there is worsening right suprahilar patchy opacity, may be developing  asymmetric pulmonary edema or pneumonia. Given relative rapid progression, aspiration is considered. Recommend correlation for aspiration risk factors  07-31-19: ct of chest:  1. There is extensive irregular bilateral ground-glass airspace opacity, most conspicuous in the right upper lobe. These findings are new compared to prior CT dated 09/29/2018 and most consistent with multifocal infection. 2. There is mild underlying tubular bronchiectasis throughout. 3. Small bilateral pleural effusions and associated atelectasis or consolidation. 4. Coronary artery disease.  Aortic Atherosclerosis   NO NEW EXAMS.    LABS REVIEWED PREVIOUS;   07-24-19: wbc 13.3; hgb 9.0; hct 31.3; mcv 89.7 plt 422; glucose 205; bun  41; creat 2.50; k+ 4.1; na++ 131; ca 9.6 BNP 365.0 07-25-19: hgb a1c 8.4 07-27-19: wbc 8.9; hct 7.7; hct 26.5; mcv 88.9 plt 392; glucose 144; bun 47; creat 2.83; k+ 3.4; na++ 135; ca 9.0; mag 1.8 07-31-19; wbc 8.3; hgb 7.3; hct 25.3; mcv 869. plt 418; glucose 199; bun 51; creat 2.55; k+ 2.7; na++ 135; ca 9.0 mag 1.7; vit B 12: 577; folate 8.0; tsh 3.105; free t4: 0.97; ferritin 36; iron 106; tibc 331 08-01-19 wbc 9.2; hgb 7.2; hct 25.1; mcv 86.6 plt 455; glucose 277; bun 46; creat 2.73; k+ 4.8; na++ 133; ca 9.0 mag 2.5 cortisol 11.9 08-03-19: glucose 237; bun 51; creat 2.81; k+ 4.4; na++ 134; ca 9.1 mag 2.2  08-09-19: glucose 300; bun 48; creat 2.95; k+ 4.3; na++ 132; ca 9.3  NO NEW LABS.    Review of Systems  Constitutional: Negative for malaise/fatigue.  Respiratory: Negative for cough and shortness of breath.   Cardiovascular: Negative for chest pain, palpitations and leg swelling.  Gastrointestinal: Negative for abdominal pain, constipation and heartburn.  Musculoskeletal: Negative for back pain, joint pain and myalgias.  Skin: Negative.   Neurological: Negative for dizziness.  Psychiatric/Behavioral: The patient is not nervous/anxious.     Physical Exam Constitutional:      General: She  is not in acute distress.    Appearance: She is well-developed. She is obese. She is not diaphoretic.  Neck:     Thyroid: No thyromegaly.  Cardiovascular:     Rate and Rhythm: Normal rate and regular rhythm.     Pulses: Normal pulses.     Heart sounds: Normal heart sounds.  Pulmonary:     Effort: Pulmonary effort is normal. No respiratory distress.     Breath sounds: Normal breath sounds.     Comments: 02 dependent  Abdominal:     General: Bowel sounds are normal. There is no distension.     Palpations: Abdomen is soft.     Tenderness: There is no abdominal tenderness.  Musculoskeletal:        General: Normal range of motion.     Cervical back: Neck supple.     Right lower leg: Edema present.     Left lower leg: Edema present.  Lymphadenopathy:     Cervical: No cervical adenopathy.  Skin:    General: Skin is warm and dry.  Neurological:     Mental Status: She is alert and oriented to person, place, and time.  Psychiatric:        Mood and Affect: Mood normal.      ASSESSMENT/ PLAN:  TODAY  1. Acute on chronic diastolic congestive heart failure Will give her an extra lasix 40 mg now and will monitor her status.    MD is aware of resident's narcotic use and is in agreement with current plan of care. We will attempt to wean resident as appropriate.  Ok Edwards NP [facility]  Contact [phone] Monday through Friday 8am- 5pm  After hours call [phone]

## 2019-08-23 ENCOUNTER — Encounter: Payer: Self-pay | Admitting: Adult Health

## 2019-08-23 ENCOUNTER — Non-Acute Institutional Stay (SKILLED_NURSING_FACILITY): Payer: Medicare HMO | Admitting: Adult Health

## 2019-08-23 DIAGNOSIS — I5033 Acute on chronic diastolic (congestive) heart failure: Secondary | ICD-10-CM | POA: Diagnosis not present

## 2019-08-23 NOTE — Progress Notes (Signed)
Location:    [place] Room Number: 128/P Place of Service:  SNF (31)   CODE STATUS: DNR  No Known Allergies  Chief Complaint  Patient presents with  . Acute Visit    Weight Gain    HPI:  Her weight on 08-18-19 was 199 pounds; current weight is 202.4 pounds. She does have shortness of breath; no chest pain. She does have bilateral lower extremity edema. She is taking lasix daily. She does have stage 4 ckd.  Therapy staff has noted that she is wheezing with activity and that it is taking her longer periods of rest to recover.   Past Medical History:  Diagnosis Date  . Chronic anticoagulation   . Depression   . Diabetes mellitus   . Hyperlipidemia   . Hypertension   . PAF (paroxysmal atrial fibrillation) ([place])   . PFO (patent foramen ovale)   . Stroke ([place])    TIA's x 2    Past Surgical History:  Procedure Laterality Date  . COLONOSCOPY    . COLONOSCOPY N/A 09/09/2014   Procedure: COLONOSCOPY;  Surgeon: Rogene Houston, MD;  [facility];  Service: Endoscopy;  Laterality: N/A;  1010 - moved to 7:30 - Ann to notify  . EP IMPLANTABLE DEVICE N/A 06/07/2015   Procedure: Loop Recorder Insertion;  Surgeon: Deboraha Sprang, MD;  Location: [place] CV LAB;  Service: Cardiovascular;  Laterality: N/A;  . TEE WITHOUT CARDIOVERSION N/A 06/07/2015   Procedure: TRANSESOPHAGEAL ECHOCARDIOGRAM (TEE);  Surgeon: Thayer Headings, MD;  Location: [facility] ENDOSCOPY;  Service: Cardiovascular;  Laterality: N/A;    Social History   Socioeconomic History  . Marital status: Widowed    Spouse name: Not on file  . Number of children: Not on file  . Years of education: Not on file  . Highest education level: Not on file  Occupational History  . Not on file  Tobacco Use  . Smoking status: Never Smoker  . Smokeless tobacco: Never Used  Vaping Use  . Vaping Use: Never used  Substance and Sexual Activity  . Alcohol use: No  . Drug use: No  . Sexual activity:  Never  Other Topics Concern  . Not on file  Social History Narrative  . Not on file   Social Determinants of Health   Financial Resource Strain:   . Difficulty of Paying Living Expenses:   Food Insecurity:   . Worried About Charity fundraiser in the Last Year:   . Arboriculturist in the Last Year:   Transportation Needs:   . Film/video editor (Medical):   Marland Kitchen Lack of Transportation (Non-Medical):   Physical Activity:   . Days of Exercise per Week:   . Minutes of Exercise per Session:   Stress:   . Feeling of Stress :   Social Connections:   . Frequency of Communication with Friends and Family:   . Frequency of Social Gatherings with Friends and Family:   . Attends Religious Services:   . Active Member of Clubs or Organizations:   . Attends Archivist Meetings:   Marland Kitchen Marital Status:   Intimate Partner Violence:   . Fear of Current or Ex-Partner:   . Emotionally Abused:   Marland Kitchen Physically Abused:   . Sexually Abused:    Family History  Problem Relation Age of Onset  . Stroke Father   . Dementia Sister       VITAL SIGNS BP 134/74  Pulse 89   Temp 98 F (36.7 C)   Ht 5\' 4"  (1.626 m)   Wt 200 lb 6.4 oz (90.9 kg)   SpO2 97%   BMI 34.40 kg/m   Outpatient Encounter Medications as of 08/23/2019  Medication Sig  . acetaminophen (TYLENOL) 500 MG tablet Take 1,000 mg by mouth at bedtime. may also take 2 tablets during the day as needed for pain  . amiodarone (PACERONE) 200 MG tablet Take 1 tablet (200 mg total) by mouth daily.  Marland Kitchen atorvastatin (LIPITOR) 20 MG tablet Take 2 tablets (40 mg total) by mouth every evening. 4pm  . buPROPion (WELLBUTRIN XL) 300 MG 24 hr tablet Take 300 mg by mouth daily.  . citalopram (CELEXA) 20 MG tablet Take 20 mg by mouth daily.   Marland Kitchen diltiazem (CARDIZEM CD) 120 MG 24 hr capsule Take 1 capsule (120 mg total) by mouth daily.  Marland Kitchen docusate sodium (COLACE) 100 MG capsule Take 100 mg by mouth daily.  Marland Kitchen ELIQUIS 2.5 MG TABS tablet Take 2.5  mg by mouth 2 (two) times daily.   . furosemide (LASIX) 40 MG tablet Take 1 tablet (40 mg total) by mouth daily.  . insulin aspart (NOVOLOG FLEXPEN) 100 UNIT/ML FlexPen insulin pen; 100 unit/mL (3 mL); amt: 10 units; subcutaneous Special Instructions: admin 5 units for CBG >150 Before Meals 08:00 AM, 11:30 AM, 05:00 PM  . insulin glargine (LANTUS) 100 UNIT/ML injection Inject 30 Units into the skin at bedtime.   . Magnesium Oxide 250 MG TABS Take 500 mg by mouth every evening.   . Multiple Vitamin (MULTIVITAMIN WITH MINERALS) TABS tablet Take 1 tablet by mouth at bedtime. For supplement  . Multiple Vitamins-Minerals (PRESERVISION AREDS 2) CAPS Take 1 capsule by mouth 2 (two) times daily.  . NON FORMULARY Diet: _____ Regular, ___X___ NAS, _______Consistent Carbohydrate, _______NPO _____Other  . OXYGEN Inhale 2 L into the lungs continuous.  . pantoprazole (PROTONIX) 40 MG tablet Take 40 mg by mouth daily. For acid reflux/gerd  . potassium chloride SA (KLOR-CON) 20 MEQ tablet Take 20 mEq by mouth daily.  . traZODone (DESYREL) 150 MG tablet Take 75 mg by mouth at bedtime. 0.5 tablet to = 75 mg   No facility-administered encounter medications on file as of 08/23/2019.     SIGNIFICANT DIAGNOSTIC EXAMS   PREVIOUS  07-24-19: chest x-ray: Bilateral pulmonary opacities may represent edema or pneumonia. Clinical correlation is recommended.  07-27-19: chest x-ray: Continued improvement in aeration in the right chest. Trace bilateral pleural effusions. Cardiomegaly. Aortic Atherosclerosis   07-28-19: 2-d echo:  Left ventricular ejection fraction, by estimation, is 60 to 65%. The  left ventricle has normal function. The left ventricle has no regional wall motion abnormalities. There is moderate left ventricular hypertrophy.  Left ventricular diastolic  parameters are consistent with Grade II diastolic dysfunction  (pseudonormalization). Elevated left atrial pressure.   07-30-19: chest x-ray: Slight  improvement in background pulmonary edema from earlier this day, however there is worsening right suprahilar patchy opacity, may be developing asymmetric pulmonary edema or pneumonia. Given relative rapid progression, aspiration is considered. Recommend correlation for aspiration risk factors  07-31-19: ct of chest:  1. There is extensive irregular bilateral ground-glass airspace opacity, most conspicuous in the right upper lobe. These findings are new compared to prior CT dated 09/29/2018 and most consistent with multifocal infection. 2. There is mild underlying tubular bronchiectasis throughout. 3. Small bilateral pleural effusions and associated atelectasis or consolidation. 4. Coronary artery disease.  Aortic Atherosclerosis  NO NEW EXAMS.    LABS REVIEWED PREVIOUS;   07-24-19: wbc 13.3; hgb 9.0; hct 31.3; mcv 89.7 plt 422; glucose 205; bun 41; creat 2.50; k+ 4.1; na++ 131; ca 9.6 BNP 365.0 07-25-19: hgb a1c 8.4 07-27-19: wbc 8.9; hct 7.7; hct 26.5; mcv 88.9 plt 392; glucose 144; bun 47; creat 2.83; k+ 3.4; na++ 135; ca 9.0; mag 1.8 07-31-19; wbc 8.3; hgb 7.3; hct 25.3; mcv 869. plt 418; glucose 199; bun 51; creat 2.55; k+ 2.7; na++ 135; ca 9.0 mag 1.7; vit B 12: 577; folate 8.0; tsh 3.105; free t4: 0.97; ferritin 36; iron 106; tibc 331 08-01-19 wbc 9.2; hgb 7.2; hct 25.1; mcv 86.6 plt 455; glucose 277; bun 46; creat 2.73; k+ 4.8; na++ 133; ca 9.0 mag 2.5 cortisol 11.9 08-03-19: glucose 237; bun 51; creat 2.81; k+ 4.4; na++ 134; ca 9.1 mag 2.2  08-09-19: glucose 300; bun 48; creat 2.95; k+ 4.3; na++ 132; ca 9.3  NO NEW LABS.    Review of Systems  Constitutional: Negative for malaise/fatigue.  Respiratory: Positive for shortness of breath. Negative for cough.   Cardiovascular: Positive for leg swelling. Negative for chest pain and palpitations.  Gastrointestinal: Negative for abdominal pain, constipation and heartburn.  Musculoskeletal: Negative for back pain, joint pain and myalgias.  Skin:  Negative.   Neurological: Negative for dizziness.  Psychiatric/Behavioral: The patient is not nervous/anxious.    Physical Exam Constitutional:      General: She is not in acute distress.    Appearance: She is well-developed. She is obese. She is not diaphoretic.  Neck:     Thyroid: No thyromegaly.  Cardiovascular:     Rate and Rhythm: Normal rate and regular rhythm.     Pulses: Normal pulses.     Heart sounds: Normal heart sounds.  Pulmonary:     Effort: Pulmonary effort is normal. No respiratory distress.     Breath sounds: Wheezing and rhonchi present.     Comments: 02 dependent  Abdominal:     General: Bowel sounds are normal. There is no distension.     Palpations: Abdomen is soft.     Tenderness: There is no abdominal tenderness.  Musculoskeletal:        General: Normal range of motion.     Cervical back: Neck supple.     Right lower leg: Edema present.     Left lower leg: Edema present.     Comments: 3+ bilateral lower extremity edema   Lymphadenopathy:     Cervical: No cervical adenopathy.  Skin:    General: Skin is warm and dry.  Neurological:     Mental Status: She is alert and oriented to person, place, and time.  Psychiatric:        Mood and Affect: Mood normal.       ASSESSMENT/ PLAN:  TODAY  1. Acute on chronic diastolic congestive heart failure  Is worse  Will give lasix 40 mg now Will begin duoneb every 6 hours for her wheezing Will get a chest x-ray;  Will continue to monitor her status.   MD is aware of resident's narcotic use and is in agreement with current plan of care. We will attempt to wean resident as appropriate.  Ok Edwards NP [facility]  Contact [phone] Monday through Friday 8am- 5pm  After hours call [phone]

## 2019-08-24 ENCOUNTER — Non-Acute Institutional Stay (SKILLED_NURSING_FACILITY): Payer: Medicare HMO | Admitting: Adult Health

## 2019-08-24 ENCOUNTER — Other Ambulatory Visit (HOSPITAL_COMMUNITY)
Admission: RE | Admit: 2019-08-24 | Discharge: 2019-08-24 | Disposition: A | Discharge status: Home / Self Care | Payer: Medicare HMO | Source: Skilled Nursing Facility | Attending: Adult Health | Admitting: Adult Health

## 2019-08-24 DIAGNOSIS — I129 Hypertensive chronic kidney disease with stage 1 through stage 4 chronic kidney disease, or unspecified chronic kidney disease: Secondary | ICD-10-CM | POA: Insufficient documentation

## 2019-08-24 DIAGNOSIS — I5033 Acute on chronic diastolic (congestive) heart failure: Secondary | ICD-10-CM

## 2019-08-24 DIAGNOSIS — J189 Pneumonia, unspecified organism: Secondary | ICD-10-CM

## 2019-08-24 LAB — BASIC METABOLIC PANEL
Anion gap: 12 (ref 5–15)
BUN: 38 mg/dL — ABNORMAL HIGH (ref 8–23)
CO2: 31 mmol/L (ref 22–32)
Calcium: 9.4 mg/dL (ref 8.9–10.3)
Chloride: 92 mmol/L — ABNORMAL LOW (ref 98–111)
Creatinine, Ser: 2.71 mg/dL — ABNORMAL HIGH (ref 0.44–1.00)
GFR calc Af Amer: 18 mL/min — ABNORMAL LOW (ref >60)
GFR calc non Af Amer: 15 mL/min — ABNORMAL LOW (ref >60)
Glucose, Bld: 147 mg/dL — ABNORMAL HIGH (ref 70–99)
Potassium: 4.1 mmol/L (ref 3.5–5.1)
Sodium: 135 mmol/L (ref 135–145)

## 2019-08-24 NOTE — Progress Notes (Signed)
Location:    [place] Room Number: 128/P Place of Service:  SNF (31)   CODE STATUS: DNR  No Known Allergies  Chief Complaint  Patient presents with  . Follow-up    X-ray    HPI:  She had a chest x-ray done due to her worsening CHF. The x-ray did show some pneumonia and chf she complains of continued shortness of breath. Has an occasional cough; no sputum. She does have bilateral lower extremity edema present and she does have ckd. There are no reports of fevers present. She did take an extra dose of lasix yesterday.    Past Medical History:  Diagnosis Date  . Chronic anticoagulation   . Depression   . Diabetes mellitus   . Hyperlipidemia   . Hypertension   . PAF (paroxysmal atrial fibrillation) ([place])   . PFO (patent foramen ovale)   . Stroke ([place])    TIA's x 2    Past Surgical History:  Procedure Laterality Date  . COLONOSCOPY    . COLONOSCOPY N/A 09/09/2014   Procedure: COLONOSCOPY;  Surgeon: Rogene Houston, MD;  [facility];  Service: Endoscopy;  Laterality: N/A;  1010 - moved to 7:30 - Ann to notify  . EP IMPLANTABLE DEVICE N/A 06/07/2015   Procedure: Loop Recorder Insertion;  Surgeon: Deboraha Sprang, MD;  Location: [place] CV LAB;  Service: Cardiovascular;  Laterality: N/A;  . TEE WITHOUT CARDIOVERSION N/A 06/07/2015   Procedure: TRANSESOPHAGEAL ECHOCARDIOGRAM (TEE);  Surgeon: Thayer Headings, MD;  Location: [facility] ENDOSCOPY;  Service: Cardiovascular;  Laterality: N/A;    Social History   Socioeconomic History  . Marital status: Widowed    Spouse name: Not on file  . Number of children: Not on file  . Years of education: Not on file  . Highest education level: Not on file  Occupational History  . Not on file  Tobacco Use  . Smoking status: Never Smoker  . Smokeless tobacco: Never Used  Vaping Use  . Vaping Use: Never used  Substance and Sexual Activity  . Alcohol use: No  . Drug use: No  . Sexual activity: Never    Other Topics Concern  . Not on file  Social History Narrative  . Not on file   Social Determinants of Health   Financial Resource Strain:   . Difficulty of Paying Living Expenses:   Food Insecurity:   . Worried About Charity fundraiser in the Last Year:   . Arboriculturist in the Last Year:   Transportation Needs:   . Film/video editor (Medical):   Marland Kitchen Lack of Transportation (Non-Medical):   Physical Activity:   . Days of Exercise per Week:   . Minutes of Exercise per Session:   Stress:   . Feeling of Stress :   Social Connections:   . Frequency of Communication with Friends and Family:   . Frequency of Social Gatherings with Friends and Family:   . Attends Religious Services:   . Active Member of Clubs or Organizations:   . Attends Archivist Meetings:   Marland Kitchen Marital Status:   Intimate Partner Violence:   . Fear of Current or Ex-Partner:   . Emotionally Abused:   Marland Kitchen Physically Abused:   . Sexually Abused:    Family History  Problem Relation Age of Onset  . Stroke Father   . Dementia Sister       VITAL SIGNS BP (!) 107/49  Pulse 85   Ht 5\' 4"  (1.626 m)   Wt 200 lb 6.4 oz (90.9 kg)   BMI 34.40 kg/m   Outpatient Encounter Medications as of 08/24/2019  Medication Sig  . acetaminophen (TYLENOL) 500 MG tablet Take 1,000 mg by mouth at bedtime. may also take 2 tablets during the day as needed for pain  . amiodarone (PACERONE) 200 MG tablet Take 1 tablet (200 mg total) by mouth daily.  Marland Kitchen atorvastatin (LIPITOR) 20 MG tablet Take 2 tablets (40 mg total) by mouth every evening. 4pm  . buPROPion (WELLBUTRIN XL) 300 MG 24 hr tablet Take 300 mg by mouth daily.  . citalopram (CELEXA) 20 MG tablet Take 20 mg by mouth daily.   Marland Kitchen diltiazem (CARDIZEM CD) 120 MG 24 hr capsule Take 1 capsule (120 mg total) by mouth daily.  Marland Kitchen docusate sodium (COLACE) 100 MG capsule Take 100 mg by mouth daily.  Marland Kitchen doxycycline (VIBRAMYCIN) 100 MG capsule Take 100 mg by mouth 2 (two)  times daily.  Marland Kitchen ELIQUIS 2.5 MG TABS tablet Take 2.5 mg by mouth 2 (two) times daily.   . insulin aspart (NOVOLOG FLEXPEN) 100 UNIT/ML FlexPen insulin pen; 100 unit/mL (3 mL); amt: 10 units; subcutaneous Special Instructions: admin 5 units for CBG >150 Before Meals 08:00 AM, 11:30 AM, 05:00 PM  . insulin glargine (LANTUS) 100 UNIT/ML injection Inject 30 Units into the skin at bedtime.   Marland Kitchen ipratropium-albuterol (DUONEB) 0.5-2.5 (3) MG/3ML SOLN Take 3 mLs by nebulization every 6 (six) hours.  . Magnesium Oxide 250 MG TABS Take 500 mg by mouth every evening.   . Multiple Vitamin (MULTIVITAMIN WITH MINERALS) TABS tablet Take 1 tablet by mouth at bedtime. For supplement  . Multiple Vitamins-Minerals (PRESERVISION AREDS 2) CAPS Take 1 capsule by mouth 2 (two) times daily.  . NON FORMULARY Diet: _____ Regular, ___X___ NAS, _______Consistent Carbohydrate, _______NPO _____Other  . NON FORMULARY Fluid restriction 1700cc/24 hours. 7-3 = 750cc 3-11 = 750cc 11-7 = 200cc Document totals qshift. Every Shift Day, Evening, Night  . OXYGEN Inhale 2 L into the lungs continuous.  . pantoprazole (PROTONIX) 40 MG tablet Take 40 mg by mouth daily. For acid reflux/gerd  . potassium chloride SA (KLOR-CON) 20 MEQ tablet Take 20 mEq by mouth daily.  Marland Kitchen torsemide (DEMADEX) 20 MG tablet Take 40 mg by mouth daily.  . traZODone (DESYREL) 150 MG tablet Take 75 mg by mouth at bedtime. 0.5 tablet to = 75 mg  . [DISCONTINUED] furosemide (LASIX) 40 MG tablet Take 1 tablet (40 mg total) by mouth daily.   No facility-administered encounter medications on file as of 08/24/2019.     SIGNIFICANT DIAGNOSTIC EXAMS   PREVIOUS  07-24-19: chest x-ray: Bilateral pulmonary opacities may represent edema or pneumonia. Clinical correlation is recommended.  07-27-19: chest x-ray: Continued improvement in aeration in the right chest. Trace bilateral pleural effusions. Cardiomegaly. Aortic Atherosclerosis   07-28-19: 2-d echo:  Left  ventricular ejection fraction, by estimation, is 60 to 65%. The  left ventricle has normal function. The left ventricle has no regional wall motion abnormalities. There is moderate left ventricular hypertrophy.  Left ventricular diastolic  parameters are consistent with Grade II diastolic dysfunction  (pseudonormalization). Elevated left atrial pressure.   07-30-19: chest x-ray: Slight improvement in background pulmonary edema from earlier this day, however there is worsening right suprahilar patchy opacity, may be developing asymmetric pulmonary edema or pneumonia. Given relative rapid progression, aspiration is considered. Recommend correlation for aspiration risk factors  07-31-19: ct  of chest:  1. There is extensive irregular bilateral ground-glass airspace opacity, most conspicuous in the right upper lobe. These findings are new compared to prior CT dated 09/29/2018 and most consistent with multifocal infection. 2. There is mild underlying tubular bronchiectasis throughout. 3. Small bilateral pleural effusions and associated atelectasis or consolidation. 4. Coronary artery disease.  Aortic Atherosclerosis   TODAY  08-23-19: chest x-ray;  1. Mild CHF/pulmonary edema vs. interstitial pneumonitis. Small right pleural effusion.  Follow up CXR as clinically warranted.  2. Mild cardiomegaly.  3. Mild degree of osteopenia.  4. Mild osteoarthritis.    LABS REVIEWED PREVIOUS;   07-24-19: wbc 13.3; hgb 9.0; hct 31.3; mcv 89.7 plt 422; glucose 205; bun 41; creat 2.50; k+ 4.1; na++ 131; ca 9.6 BNP 365.0 07-25-19: hgb a1c 8.4 07-27-19: wbc 8.9; hct 7.7; hct 26.5; mcv 88.9 plt 392; glucose 144; bun 47; creat 2.83; k+ 3.4; na++ 135; ca 9.0; mag 1.8 07-31-19; wbc 8.3; hgb 7.3; hct 25.3; mcv 869. plt 418; glucose 199; bun 51; creat 2.55; k+ 2.7; na++ 135; ca 9.0 mag 1.7; vit B 12: 577; folate 8.0; tsh 3.105; free t4: 0.97; ferritin 36; iron 106; tibc 331 08-01-19 wbc 9.2; hgb 7.2; hct 25.1; mcv 86.6 plt 455;  glucose 277; bun 46; creat 2.73; k+ 4.8; na++ 133; ca 9.0 mag 2.5 cortisol 11.9 08-03-19: glucose 237; bun 51; creat 2.81; k+ 4.4; na++ 134; ca 9.1 mag 2.2  08-09-19: glucose 300; bun 48; creat 2.95; k+ 4.3; na++ 132; ca 9.3  NO NEW LABS.    Review of Systems  Constitutional: Positive for malaise/fatigue.  Respiratory: Positive for cough and shortness of breath.   Cardiovascular: Positive for leg swelling. Negative for chest pain and palpitations.  Gastrointestinal: Negative for abdominal pain, constipation and heartburn.  Musculoskeletal: Negative for back pain, joint pain and myalgias.  Skin: Negative.   Neurological: Negative for dizziness.  Psychiatric/Behavioral: The patient is not nervous/anxious.     Physical Exam Constitutional:      General: She is not in acute distress.    Appearance: She is well-developed. She is not diaphoretic.  Neck:     Thyroid: No thyromegaly.  Cardiovascular:     Rate and Rhythm: Normal rate and regular rhythm.     Heart sounds: Normal heart sounds.  Pulmonary:     Effort: Pulmonary effort is normal. No respiratory distress.     Breath sounds: Wheezing and rhonchi present.     Comments: 02 dependent  Abdominal:     General: Bowel sounds are normal. There is no distension.     Palpations: Abdomen is soft.     Tenderness: There is no abdominal tenderness.  Musculoskeletal:        General: Normal range of motion.     Right lower leg: Edema present.     Left lower leg: Edema present.     Comments: 3+ bilateral lower extremity edema   Lymphadenopathy:     Cervical: No cervical adenopathy.  Skin:    General: Skin is warm and dry.  Neurological:     Mental Status: She is alert and oriented to person, place, and time.  Psychiatric:        Mood and Affect: Mood normal.        ASSESSMENT/ PLAN:  TODAY  1. Acute on chronic diastolic congestive heart failure 2. HCAP  Will stop lasix  Will begin demadex 40 mg daily  Will check BMP  08-26-19 Will begin doxycycline 100 mg twice  daily through 08-31-19.   MD is aware of resident's narcotic use and is in agreement with current plan of care. We will attempt to wean resident as appropriate.  Ok Edwards NP [facility]  Contact [phone] Monday through Friday 8am- 5pm  After hours call [phone]

## 2019-08-25 ENCOUNTER — Non-Acute Institutional Stay (SKILLED_NURSING_FACILITY): Payer: Medicare HMO | Admitting: Adult Health

## 2019-08-25 ENCOUNTER — Encounter: Payer: Self-pay | Admitting: Adult Health

## 2019-08-25 DIAGNOSIS — I5033 Acute on chronic diastolic (congestive) heart failure: Secondary | ICD-10-CM | POA: Diagnosis not present

## 2019-08-25 DIAGNOSIS — E1169 Type 2 diabetes mellitus with other specified complication: Secondary | ICD-10-CM | POA: Diagnosis not present

## 2019-08-25 DIAGNOSIS — E785 Hyperlipidemia, unspecified: Secondary | ICD-10-CM

## 2019-08-25 DIAGNOSIS — N184 Chronic kidney disease, stage 4 (severe): Secondary | ICD-10-CM | POA: Diagnosis not present

## 2019-08-25 DIAGNOSIS — E1122 Type 2 diabetes mellitus with diabetic chronic kidney disease: Secondary | ICD-10-CM | POA: Diagnosis not present

## 2019-08-25 NOTE — Progress Notes (Signed)
Location:    [place] Room Number: 128/P Place of Service:  SNF (31)   CODE STATUS: DNR  No Known Allergies  Chief Complaint  Patient presents with   Short Term Rehab          Acute on chronic diastolic (congestive) heart failure  Hyperlipidemia associated with type 2 diabetes mellitus: CKD stage 4 due to type 2 diabetes mellitis:  Weekly follow up for the first 30 days post hospitalization.     HPI:  She is a 86 year old short term rehab patient being seen for the management of her chronic illnesses: chf; hyperlipidemia; ckd. She is presently being treated for HCAP; her diuretic has been changed in an effort to preserve her renal function. She continues to have shortness of breath; and lower extremity edema.   Past Medical History:  Diagnosis Date   Chronic anticoagulation    Depression    Diabetes mellitus    Hyperlipidemia    Hypertension    PAF (paroxysmal atrial fibrillation) ([facility])    PFO (patent foramen ovale)    Stroke ([facility])    TIA's x 2    Past Surgical History:  Procedure Laterality Date   COLONOSCOPY     COLONOSCOPY N/A 09/09/2014   Procedure: COLONOSCOPY;  Surgeon: Rogene Houston, MD;  [facility];  Service: Endoscopy;  Laterality: N/A;  1010 - moved to 7:30 - Ann to notify   EP IMPLANTABLE DEVICE N/A 06/07/2015   Procedure: Loop Recorder Insertion;  Surgeon: Deboraha Sprang, MD;  Location: [place] CV LAB;  Service: Cardiovascular;  Laterality: N/A;   TEE WITHOUT CARDIOVERSION N/A 06/07/2015   Procedure: TRANSESOPHAGEAL ECHOCARDIOGRAM (TEE);  Surgeon: Thayer Headings, MD;  Location: [facility] ENDOSCOPY;  Service: Cardiovascular;  Laterality: N/A;    Social History   Socioeconomic History   Marital status: Widowed    Spouse name: Not on file   Number of children: Not on file   Years of education: Not on file   Highest education level: Not on file  Occupational History   Not on file  Tobacco Use    Smoking status: Never Smoker   Smokeless tobacco: Never Used  Vaping Use   Vaping Use: Never used  Substance and Sexual Activity   Alcohol use: No   Drug use: No   Sexual activity: Never  Other Topics Concern   Not on file  Social History Narrative   Not on file   Social Determinants of Health   Financial Resource Strain:    Difficulty of Paying Living Expenses:   Food Insecurity:    Worried About Charity fundraiser in the Last Year:    Arboriculturist in the Last Year:   Transportation Needs:    Film/video editor (Medical):    Lack of Transportation (Non-Medical):   Physical Activity:    Days of Exercise per Week:    Minutes of Exercise per Session:   Stress:    Feeling of Stress :   Social Connections:    Frequency of Communication with Friends and Family:    Frequency of Social Gatherings with Friends and Family:    Attends Religious Services:    Active Member of Clubs or Organizations:    Attends Archivist Meetings:    Marital Status:   Intimate Partner Violence:    Fear of Current or Ex-Partner:    Emotionally Abused:    Physically Abused:    Sexually  Abused:    Family History  Problem Relation Age of Onset   Stroke Father    Dementia Sister       VITAL SIGNS BP 134/74    Pulse 89    Temp 97.6 F (36.4 C) (Oral)    Resp 20    Ht 5\' 4"  (1.626 m)    Wt 200 lb 6.4 oz (90.9 kg)    SpO2 96%    BMI 34.40 kg/m   Outpatient Encounter Medications as of 08/25/2019  Medication Sig   acetaminophen (TYLENOL) 500 MG tablet Take 1,000 mg by mouth at bedtime. may also take 2 tablets during the day as needed for pain   amiodarone (PACERONE) 200 MG tablet Take 1 tablet (200 mg total) by mouth daily.   atorvastatin (LIPITOR) 20 MG tablet Take 2 tablets (40 mg total) by mouth every evening. 4pm   buPROPion (WELLBUTRIN XL) 300 MG 24 hr tablet Take 300 mg by mouth daily.   citalopram (CELEXA) 20 MG tablet Take 20 mg by mouth  daily.    diltiazem (CARDIZEM CD) 120 MG 24 hr capsule Take 1 capsule (120 mg total) by mouth daily.   docusate sodium (COLACE) 100 MG capsule Take 100 mg by mouth daily.   doxycycline (VIBRAMYCIN) 100 MG capsule Take 100 mg by mouth 2 (two) times daily.   ELIQUIS 2.5 MG TABS tablet Take 2.5 mg by mouth 2 (two) times daily.    insulin aspart (NOVOLOG FLEXPEN) 100 UNIT/ML FlexPen insulin pen; 100 unit/mL (3 mL); amt: 10 units; subcutaneous Special Instructions: admin 5 units for CBG >150 Before Meals 08:00 AM, 11:30 AM, 05:00 PM   insulin glargine (LANTUS) 100 UNIT/ML injection Inject 30 Units into the skin at bedtime.    ipratropium-albuterol (DUONEB) 0.5-2.5 (3) MG/3ML SOLN Take 3 mLs by nebulization every 6 (six) hours.   Magnesium Oxide 250 MG TABS Take 500 mg by mouth every evening.    Multiple Vitamin (MULTIVITAMIN WITH MINERALS) TABS tablet Take 1 tablet by mouth at bedtime. For supplement   Multiple Vitamins-Minerals (PRESERVISION AREDS 2) CAPS Take 1 capsule by mouth 2 (two) times daily.   NON FORMULARY Diet: _____ Regular, ___X___ NAS, _______Consistent Carbohydrate, _______NPO _____Other   NON FORMULARY Fluid restriction 1700cc/24 hours. 7-3 = 750cc 3-11 = 750cc 11-7 = 200cc Document totals qshift. Every Shift Day, Evening, Night   OXYGEN Inhale 2 L into the lungs continuous.   pantoprazole (PROTONIX) 40 MG tablet Take 40 mg by mouth daily. For acid reflux/gerd   potassium chloride SA (KLOR-CON) 20 MEQ tablet Take 20 mEq by mouth daily.   torsemide (DEMADEX) 20 MG tablet Take 40 mg by mouth daily.   traZODone (DESYREL) 150 MG tablet Take 75 mg by mouth at bedtime. 0.5 tablet to = 75 mg   No facility-administered encounter medications on file as of 08/25/2019.     SIGNIFICANT DIAGNOSTIC EXAMS   PREVIOUS  07-24-19: chest x-ray: Bilateral pulmonary opacities may represent edema or pneumonia. Clinical correlation is recommended.  07-27-19: chest x-ray:  Continued improvement in aeration in the right chest. Trace bilateral pleural effusions. Cardiomegaly. Aortic Atherosclerosis   07-28-19: 2-d echo:  Left ventricular ejection fraction, by estimation, is 60 to 65%. The  left ventricle has normal function. The left ventricle has no regional wall motion abnormalities. There is moderate left ventricular hypertrophy.  Left ventricular diastolic  parameters are consistent with Grade II diastolic dysfunction  (pseudonormalization). Elevated left atrial pressure.   07-30-19: chest x-ray: Slight improvement in  background pulmonary edema from earlier this day, however there is worsening right suprahilar patchy opacity, may be developing asymmetric pulmonary edema or pneumonia. Given relative rapid progression, aspiration is considered. Recommend correlation for aspiration risk factors  07-31-19: ct of chest:  1. There is extensive irregular bilateral ground-glass airspace opacity, most conspicuous in the right upper lobe. These findings are new compared to prior CT dated 09/29/2018 and most consistent with multifocal infection. 2. There is mild underlying tubular bronchiectasis throughout. 3. Small bilateral pleural effusions and associated atelectasis or consolidation. 4. Coronary artery disease.  Aortic Atherosclerosis   TODAY  08-23-19: chest x-ray;  1. Mild CHF/pulmonary edema vs. interstitial pneumonitis. Small right pleural effusion.  Follow up CXR as clinically warranted.  2. Mild cardiomegaly.  3. Mild degree of osteopenia.  4. Mild osteoarthritis.    LABS REVIEWED PREVIOUS;   07-24-19: wbc 13.3; hgb 9.0; hct 31.3; mcv 89.7 plt 422; glucose 205; bun 41; creat 2.50; k+ 4.1; na++ 131; ca 9.6 BNP 365.0 07-25-19: hgb a1c 8.4 07-27-19: wbc 8.9; hct 7.7; hct 26.5; mcv 88.9 plt 392; glucose 144; bun 47; creat 2.83; k+ 3.4; na++ 135; ca 9.0; mag 1.8 07-31-19; wbc 8.3; hgb 7.3; hct 25.3; mcv 869. plt 418; glucose 199; bun 51; creat 2.55; k+ 2.7; na++ 135; ca 9.0  mag 1.7; vit B 12: 577; folate 8.0; tsh 3.105; free t4: 0.97; ferritin 36; iron 106; tibc 331 08-01-19 wbc 9.2; hgb 7.2; hct 25.1; mcv 86.6 plt 455; glucose 277; bun 46; creat 2.73; k+ 4.8; na++ 133; ca 9.0 mag 2.5 cortisol 11.9 08-03-19: glucose 237; bun 51; creat 2.81; k+ 4.4; na++ 134; ca 9.1 mag 2.2  08-09-19: glucose 300; bun 48; creat 2.95; k+ 4.3; na++ 132; ca 9.3  NO NEW LABS.    Review of Systems  Constitutional: Positive for malaise/fatigue.  Respiratory: Positive for shortness of breath. Negative for cough.   Cardiovascular: Positive for leg swelling. Negative for chest pain and palpitations.  Gastrointestinal: Negative for abdominal pain, constipation and heartburn.  Musculoskeletal: Negative for back pain, joint pain and myalgias.  Skin: Negative.   Neurological: Negative for dizziness.  Psychiatric/Behavioral: The patient is not nervous/anxious.     Physical Exam Constitutional:      General: She is not in acute distress.    Appearance: She is well-developed. She is obese. She is not diaphoretic.  Neck:     Thyroid: No thyromegaly.  Cardiovascular:     Rate and Rhythm: Normal rate and regular rhythm.     Pulses: Normal pulses.     Heart sounds: Normal heart sounds.  Pulmonary:     Effort: Pulmonary effort is normal. No respiratory distress.     Breath sounds: Rhonchi present.  Abdominal:     General: Bowel sounds are normal. There is no distension.     Palpations: Abdomen is soft.     Tenderness: There is no abdominal tenderness.  Musculoskeletal:        General: Normal range of motion.     Cervical back: Neck supple.     Right lower leg: Edema present.     Left lower leg: Edema present.     Comments: 3+ bilateral lower extremity edema   Lymphadenopathy:     Cervical: No cervical adenopathy.  Skin:    General: Skin is warm and dry.  Neurological:     Mental Status: She is alert and oriented to person, place, and time.  Psychiatric:        Mood  and Affect: Mood  normal.       ASSESSMENT/ PLAN:  TODAY  1. Acute on chronic diastolic (congestive) heart failure EF 60-65% (07-28-19) is without change in status; will continue demadex 40 mg daily changed from lasix due to her renal failure. Will monitor   2. Hyperlipidemia associated with type 2 diabetes mellitus: is stable will continue lipitor 40 mg daily   3. CKD stage 4 due to type 2 diabetes mellitis: is without change: bun 48 creat 2.95 will monitor     PREVIOUS   4. GERD without esophagitis: is stable will continue protonix 40 mg daily   5. Chronic constipation: is stable will continue  colace daily   6. Major depression recurrent chronic: is stable will continue celexa 20 mg daily wellbutrin xl 300 mg daily trazodone 150 mg nightly   7. Hypomagnesemia: is stable mag 2.2 will continue magox 500 mg daily   8. Chronic generalized pain: will continue tylenol 1 gm nightly   9. Acute respiratory with hypoxia: is stable will continue is 02 dependent was not dependent prior to hospitalization will monitor  10. Chronic afib with RVR will continue amiodarone 200 mg daily and cardizem cd 120 gm daily for rate control is on eliquis 2.5 mg twice daily   11. Cerebrovascular accident due to embolism of right posterior cerebral artery: is stable will continue eliquis 2.5 mg twice daily   12. Type 2 diabetes mellitus with other circulatory complication with long term current use of insulin: is stable hgb a1c 8.4 will continue lantus 30 units with meals and novlog 10 units with meals.      MD is aware of resident's narcotic use and is in agreement with current plan of care. We will attempt to wean resident as appropriate.  Ok Edwards NP [facility]  Contact [phone] Monday through Friday 8am- 5pm  After hours call [phone]

## 2019-08-26 ENCOUNTER — Other Ambulatory Visit (HOSPITAL_COMMUNITY)
Admission: RE | Admit: 2019-08-26 | Discharge: 2019-08-26 | Disposition: A | Discharge status: Home / Self Care | Payer: Medicare HMO | Source: Skilled Nursing Facility | Attending: Adult Health | Admitting: Adult Health

## 2019-08-26 ENCOUNTER — Other Ambulatory Visit: Payer: Self-pay | Admitting: Adult Health

## 2019-08-26 ENCOUNTER — Non-Acute Institutional Stay (SKILLED_NURSING_FACILITY): Payer: Medicare HMO | Admitting: Adult Health

## 2019-08-26 ENCOUNTER — Encounter: Payer: Self-pay | Admitting: Adult Health

## 2019-08-26 DIAGNOSIS — I5033 Acute on chronic diastolic (congestive) heart failure: Secondary | ICD-10-CM

## 2019-08-26 DIAGNOSIS — I129 Hypertensive chronic kidney disease with stage 1 through stage 4 chronic kidney disease, or unspecified chronic kidney disease: Secondary | ICD-10-CM

## 2019-08-26 DIAGNOSIS — E1122 Type 2 diabetes mellitus with diabetic chronic kidney disease: Secondary | ICD-10-CM | POA: Diagnosis not present

## 2019-08-26 DIAGNOSIS — J189 Pneumonia, unspecified organism: Secondary | ICD-10-CM | POA: Insufficient documentation

## 2019-08-26 DIAGNOSIS — I482 Chronic atrial fibrillation, unspecified: Secondary | ICD-10-CM | POA: Diagnosis not present

## 2019-08-26 DIAGNOSIS — J9621 Acute and chronic respiratory failure with hypoxia: Secondary | ICD-10-CM | POA: Diagnosis not present

## 2019-08-26 DIAGNOSIS — N184 Chronic kidney disease, stage 4 (severe): Secondary | ICD-10-CM

## 2019-08-26 LAB — BASIC METABOLIC PANEL
Anion gap: 10 (ref 5–15)
BUN: 45 mg/dL — ABNORMAL HIGH (ref 8–23)
CO2: 33 mmol/L — ABNORMAL HIGH (ref 22–32)
Calcium: 9.6 mg/dL (ref 8.9–10.3)
Chloride: 91 mmol/L — ABNORMAL LOW (ref 98–111)
Creatinine, Ser: 2.95 mg/dL — ABNORMAL HIGH (ref 0.44–1.00)
GFR calc Af Amer: 16 mL/min — ABNORMAL LOW (ref >60)
GFR calc non Af Amer: 14 mL/min — ABNORMAL LOW (ref >60)
Glucose, Bld: 153 mg/dL — ABNORMAL HIGH (ref 70–99)
Potassium: 4.1 mmol/L (ref 3.5–5.1)
Sodium: 134 mmol/L — ABNORMAL LOW (ref 135–145)

## 2019-08-26 MED ORDER — CITALOPRAM HYDROBROMIDE 20 MG PO TABS: 20.0000 mg | ORAL_TABLET | Freq: Every day | ORAL | 0 refills | Status: AC

## 2019-08-26 MED ORDER — TRAZODONE HCL 150 MG PO TABS: 75.0000 mg | ORAL_TABLET | Freq: Every day | ORAL | 0 refills | Status: DC

## 2019-08-26 MED ORDER — DILTIAZEM HCL ER COATED BEADS 120 MG PO CP24: 120.0000 mg | ORAL_CAPSULE | Freq: Every day | ORAL | 0 refills | Status: AC

## 2019-08-26 MED ORDER — IPRATROPIUM-ALBUTEROL 0.5-2.5 (3) MG/3ML IN SOLN: 3.0000 mL | Freq: Four times a day (QID) | RESPIRATORY_TRACT | 0 refills | Status: AC

## 2019-08-26 MED ORDER — DOXYCYCLINE HYCLATE 100 MG PO CAPS: 100.0000 mg | ORAL_CAPSULE | Freq: Two times a day (BID) | ORAL | 0 refills | Status: AC

## 2019-08-26 MED ORDER — AMIODARONE HCL 200 MG PO TABS: 200.0000 mg | ORAL_TABLET | Freq: Every day | ORAL | 0 refills | Status: AC

## 2019-08-26 MED ORDER — INSULIN GLARGINE 100 UNIT/ML ~~LOC~~ SOLN: 30.0000 [IU] | Freq: Every day | SUBCUTANEOUS | 0 refills | Status: DC

## 2019-08-26 MED ORDER — POTASSIUM CHLORIDE CRYS ER 20 MEQ PO TBCR: 20.0000 meq | EXTENDED_RELEASE_TABLET | Freq: Every day | ORAL | 0 refills | Status: DC

## 2019-08-26 MED ORDER — BUPROPION HCL ER (XL) 300 MG PO TB24: 300.0000 mg | ORAL_TABLET | Freq: Every day | ORAL | 0 refills | Status: AC

## 2019-08-26 MED ORDER — ELIQUIS 2.5 MG PO TABS: 2.5000 mg | ORAL_TABLET | Freq: Two times a day (BID) | ORAL | 0 refills | Status: DC

## 2019-08-26 MED ORDER — NOVOLOG FLEXPEN 100 UNIT/ML ~~LOC~~ SOPN: 10.0000 [IU] | PEN_INJECTOR | Freq: Three times a day (TID) | SUBCUTANEOUS | 0 refills | Status: AC

## 2019-08-26 MED ORDER — TORSEMIDE 20 MG PO TABS: 40.0000 mg | ORAL_TABLET | Freq: Every day | ORAL | 0 refills | Status: DC

## 2019-08-26 MED ORDER — ATORVASTATIN CALCIUM 20 MG PO TABS: 40.0000 mg | ORAL_TABLET | Freq: Every evening | ORAL | 0 refills | Status: DC

## 2019-08-26 MED ORDER — PANTOPRAZOLE SODIUM 40 MG PO TBEC: 40.0000 mg | DELAYED_RELEASE_TABLET | Freq: Every day | ORAL | 0 refills | Status: AC

## 2019-08-26 MED ORDER — MAGNESIUM OXIDE 250 MG PO TABS: 500.0000 mg | ORAL_TABLET | Freq: Every evening | ORAL | 0 refills | Status: DC

## 2019-08-26 NOTE — Progress Notes (Signed)
Location:    [facility] Room Number: 128/P Place of Service:  SNF (31)    CODE STATUS: DNR  No Known Allergies  Chief Complaint  Patient presents with   Discharge Note    Discharge Visit    HPI:  She is being discharged to home with home health for pt/ot/cna. She will need a wheelchair; home 02. She will need her prescriptions written and will need to follow up with her medical provider. She had been hospitalized for chf. She is presently being treated for pneumonia and chf. Her family is wanting to take her home. They do have one on one care arranged for her.    Past Medical History:  Diagnosis Date   Chronic anticoagulation    Depression    Diabetes mellitus    Hyperlipidemia    Hypertension    PAF (paroxysmal atrial fibrillation) (HCC)    PFO (patent foramen ovale)    Stroke (HCC)    TIA's x 2    Past Surgical History:  Procedure Laterality Date   COLONOSCOPY     COLONOSCOPY N/A 09/09/2014   Procedure: COLONOSCOPY;  Surgeon: Rogene Houston, MD;  [facility];  Service: Endoscopy;  Laterality: N/A;  1010 - moved to 7:30 - Ann to notify   EP IMPLANTABLE DEVICE N/A 06/07/2015   Procedure: Loop Recorder Insertion;  Surgeon: Deboraha Sprang, MD;  Location: [facility];  Service: Cardiovascular;  Laterality: N/A;   TEE WITHOUT CARDIOVERSION N/A 06/07/2015   Procedure: TRANSESOPHAGEAL ECHOCARDIOGRAM (TEE);  Surgeon: Thayer Headings, MD;  Location: [facility] ENDOSCOPY;  Service: Cardiovascular;  Laterality: N/A;    Social History   Socioeconomic History   Marital status: Widowed    Spouse name: Not on file   Number of children: Not on file   Years of education: Not on file   Highest education level: Not on file  Occupational History   Not on file  Tobacco Use   Smoking status: Never Smoker   Smokeless tobacco: Never Used  Vaping Use   Vaping Use: Never used  Substance and Sexual Activity   Alcohol use: No     Drug use: No   Sexual activity: Never  Other Topics Concern   Not on file  Social History Narrative   Not on file   Social Determinants of Health   Financial Resource Strain:    Difficulty of Paying Living Expenses:   Food Insecurity:    Worried About Charity fundraiser in the Last Year:    Arboriculturist in the Last Year:   Transportation Needs:    Film/video editor (Medical):    Lack of Transportation (Non-Medical):   Physical Activity:    Days of Exercise per Week:    Minutes of Exercise per Session:   Stress:    Feeling of Stress :   Social Connections:    Frequency of Communication with Friends and Family:    Frequency of Social Gatherings with Friends and Family:    Attends Religious Services:    Active Member of Clubs or Organizations:    Attends Music therapist:    Marital Status:   Intimate Partner Violence:    Fear of Current or Ex-Partner:    Emotionally Abused:    Physically Abused:    Sexually Abused:    Family History  Problem Relation Age of Onset   Stroke Father    Dementia Sister  VITAL SIGNS BP 134/74    Pulse 89    Temp 98.2 F (36.8 C) (Oral)    Resp 20    Ht 5\' 4"  (1.626 m)    Wt 202 lb (91.6 kg)    SpO2 93%    BMI 34.67 kg/m   Patient's Medications  New Prescriptions   No medications on file  Previous Medications   ACETAMINOPHEN (TYLENOL) 500 MG TABLET    Take 1,000 mg by mouth at bedtime. may also take 2 tablets during the day as needed for pain   AMIODARONE (PACERONE) 200 MG TABLET    Take 1 tablet (200 mg total) by mouth daily.   ATORVASTATIN (LIPITOR) 20 MG TABLET    Take 2 tablets (40 mg total) by mouth every evening. 4pm   BUPROPION (WELLBUTRIN XL) 300 MG 24 HR TABLET    Take 300 mg by mouth daily.   CITALOPRAM (CELEXA) 20 MG TABLET    Take 20 mg by mouth daily.    DILTIAZEM (CARDIZEM CD) 120 MG 24 HR CAPSULE    Take 1 capsule (120 mg total) by mouth daily.   DOCUSATE SODIUM (COLACE)  100 MG CAPSULE    Take 100 mg by mouth daily.   DOXYCYCLINE (VIBRAMYCIN) 100 MG CAPSULE    Take 100 mg by mouth 2 (two) times daily.   ELIQUIS 2.5 MG TABS TABLET    Take 2.5 mg by mouth 2 (two) times daily.    INSULIN ASPART (NOVOLOG FLEXPEN) 100 UNIT/ML FLEXPEN    insulin pen; 100 unit/mL (3 mL); amt: 10 units; subcutaneous Special Instructions: admin 5 units for CBG >150 Before Meals 08:00 AM, 11:30 AM, 05:00 PM   INSULIN GLARGINE (LANTUS) 100 UNIT/ML INJECTION    Inject 30 Units into the skin at bedtime.    IPRATROPIUM-ALBUTEROL (DUONEB) 0.5-2.5 (3) MG/3ML SOLN    Take 3 mLs by nebulization every 6 (six) hours.   MAGNESIUM OXIDE 250 MG TABS    Take 500 mg by mouth every evening.    MULTIPLE VITAMIN (MULTIVITAMIN WITH MINERALS) TABS TABLET    Take 1 tablet by mouth at bedtime. For supplement   MULTIPLE VITAMINS-MINERALS (PRESERVISION AREDS 2) CAPS    Take 1 capsule by mouth 2 (two) times daily.   NON FORMULARY    Diet: _____ Regular, ___X___ NAS, _______Consistent Carbohydrate, _______NPO _____Other   NON FORMULARY    Fluid restriction 1700cc/24 hours. 7-3 = 750cc 3-11 = 750cc 11-7 = 200cc Document totals qshift. Every Shift Day, Evening, Night   OXYGEN    Inhale 2 L into the lungs continuous.   PANTOPRAZOLE (PROTONIX) 40 MG TABLET    Take 40 mg by mouth daily. For acid reflux/gerd   POTASSIUM CHLORIDE SA (KLOR-CON) 20 MEQ TABLET    Take 20 mEq by mouth daily.   TORSEMIDE (DEMADEX) 20 MG TABLET    Take 40 mg by mouth daily.   TRAZODONE (DESYREL) 150 MG TABLET    Take 75 mg by mouth at bedtime. 0.5 tablet to = 75 mg  Modified Medications   No medications on file  Discontinued Medications   No medications on file     SIGNIFICANT DIAGNOSTIC EXAMS   PREVIOUS  07-24-19: chest x-ray: Bilateral pulmonary opacities may represent edema or pneumonia. Clinical correlation is recommended.  07-27-19: chest x-ray: Continued improvement in aeration in the right chest. Trace bilateral  pleural effusions. Cardiomegaly. Aortic Atherosclerosis   07-28-19: 2-d echo:  Left ventricular ejection fraction, by estimation, is 60 to 65%.  The  left ventricle has normal function. The left ventricle has no regional wall motion abnormalities. There is moderate left ventricular hypertrophy.  Left ventricular diastolic  parameters are consistent with Grade II diastolic dysfunction  (pseudonormalization). Elevated left atrial pressure.   07-30-19: chest x-ray: Slight improvement in background pulmonary edema from earlier this day, however there is worsening right suprahilar patchy opacity, may be developing asymmetric pulmonary edema or pneumonia. Given relative rapid progression, aspiration is considered. Recommend correlation for aspiration risk factors  07-31-19: ct of chest:  1. There is extensive irregular bilateral ground-glass airspace opacity, most conspicuous in the right upper lobe. These findings are new compared to prior CT dated 09/29/2018 and most consistent with multifocal infection. 2. There is mild underlying tubular bronchiectasis throughout. 3. Small bilateral pleural effusions and associated atelectasis or consolidation. 4. Coronary artery disease.  Aortic Atherosclerosis   08-23-19: chest x-ray;  1. Mild CHF/pulmonary edema vs. interstitial pneumonitis. Small right pleural effusion.  Follow up CXR as clinically warranted.  2. Mild cardiomegaly.  3. Mild degree of osteopenia.  4. Mild osteoarthritis.  NO NEW EXAMS.    LABS REVIEWED PREVIOUS;   07-24-19: wbc 13.3; hgb 9.0; hct 31.3; mcv 89.7 plt 422; glucose 205; bun 41; creat 2.50; k+ 4.1; na++ 131; ca 9.6 BNP 365.0 07-25-19: hgb a1c 8.4 07-27-19: wbc 8.9; hct 7.7; hct 26.5; mcv 88.9 plt 392; glucose 144; bun 47; creat 2.83; k+ 3.4; na++ 135; ca 9.0; mag 1.8 07-31-19; wbc 8.3; hgb 7.3; hct 25.3; mcv 869. plt 418; glucose 199; bun 51; creat 2.55; k+ 2.7; na++ 135; ca 9.0 mag 1.7; vit B 12: 577; folate 8.0; tsh 3.105; free t4: 0.97;  ferritin 36; iron 106; tibc 331 08-01-19 wbc 9.2; hgb 7.2; hct 25.1; mcv 86.6 plt 455; glucose 277; bun 46; creat 2.73; k+ 4.8; na++ 133; ca 9.0 mag 2.5 cortisol 11.9 08-03-19: glucose 237; bun 51; creat 2.81; k+ 4.4; na++ 134; ca 9.1 mag 2.2  08-09-19: glucose 300; bun 48; creat 2.95; k+ 4.3; na++ 132; ca 9.3  NO NEW LABS.     Review of Systems  Constitutional: Positive for malaise/fatigue.  Respiratory: Positive for shortness of breath. Negative for cough.   Cardiovascular: Positive for leg swelling. Negative for chest pain and palpitations.  Gastrointestinal: Negative for abdominal pain, constipation and heartburn.  Musculoskeletal: Negative for back pain, joint pain and myalgias.  Skin: Negative.   Neurological: Negative for dizziness.  Psychiatric/Behavioral: The patient is not nervous/anxious.     Physical Exam Constitutional:      General: She is not in acute distress.    Appearance: She is well-developed. She is not diaphoretic.  Neck:     Thyroid: No thyromegaly.  Cardiovascular:     Rate and Rhythm: Normal rate and regular rhythm.     Pulses: Normal pulses.     Heart sounds: Normal heart sounds.  Pulmonary:     Effort: Pulmonary effort is normal. No respiratory distress.     Breath sounds: Rhonchi present.     Comments: 02 few scattered rhonchi  Abdominal:     General: Bowel sounds are normal. There is no distension.     Palpations: Abdomen is soft.     Tenderness: There is no abdominal tenderness.  Musculoskeletal:        General: Normal range of motion.     Cervical back: Neck supple.     Right lower leg: Edema present.     Left lower leg: Edema present.  Comments: 3+ bilateral lower extremity edema   Lymphadenopathy:     Cervical: No cervical adenopathy.  Skin:    General: Skin is warm and dry.  Neurological:     Mental Status: She is alert and oriented to person, place, and time.  Psychiatric:        Mood and Affect: Mood normal.       ASSESSMENT/  PLAN:   Patient is being discharged with the following home health services:  Pt/pt/cna: to evaluate and treat as indicated for gait balance strength adl training adl care,   Patient is being discharged with the following durable medical equipment:  02 at 2L/[place] with concentrator and portable 02 take: room air at rest 95%; with activity 65%; on 02 at 2L 100 %.  Standard wheelchair with elevated leg rests; cushion; brake extensions; anti-tippers to allow her to maintain her current level of independence with her adls which cannot be achieved with a cane crutches; walker. She is able to propel self in wheelchair.   Patient has been advised to f/u with their PCP in 1-2 weeks to bring them up to date on their rehab stay.  Social services at facility was responsible for arranging this appointment.  Pt was provided with a 30 day supply of prescriptions for medications and refills must be obtained from their PCP.  For controlled substances, a more limited supply may be provided adequate until PCP appointment only.  A 30 day supply of her prescription medications have been sent to [facility]  Time spent with patient 45 minutes: medications home health needs; dme.     Ok Edwards NP [facility]  Contact [phone] Monday through Friday 8am- 5pm  After hours call [phone]

## 2019-08-31 DIAGNOSIS — N184 Chronic kidney disease, stage 4 (severe): Secondary | ICD-10-CM | POA: Diagnosis not present

## 2019-08-31 DIAGNOSIS — I130 Hypertensive heart and chronic kidney disease with heart failure and stage 1 through stage 4 chronic kidney disease, or unspecified chronic kidney disease: Secondary | ICD-10-CM | POA: Diagnosis not present

## 2019-08-31 DIAGNOSIS — J189 Pneumonia, unspecified organism: Secondary | ICD-10-CM | POA: Diagnosis not present

## 2019-08-31 DIAGNOSIS — E1169 Type 2 diabetes mellitus with other specified complication: Secondary | ICD-10-CM | POA: Diagnosis not present

## 2019-08-31 DIAGNOSIS — I480 Paroxysmal atrial fibrillation: Secondary | ICD-10-CM | POA: Diagnosis not present

## 2019-08-31 DIAGNOSIS — F339 Major depressive disorder, recurrent, unspecified: Secondary | ICD-10-CM | POA: Diagnosis not present

## 2019-08-31 DIAGNOSIS — E1122 Type 2 diabetes mellitus with diabetic chronic kidney disease: Secondary | ICD-10-CM | POA: Diagnosis not present

## 2019-08-31 DIAGNOSIS — I5033 Acute on chronic diastolic (congestive) heart failure: Secondary | ICD-10-CM | POA: Diagnosis not present

## 2019-08-31 DIAGNOSIS — I2510 Atherosclerotic heart disease of native coronary artery without angina pectoris: Secondary | ICD-10-CM | POA: Diagnosis not present

## 2019-08-31 DIAGNOSIS — K219 Gastro-esophageal reflux disease without esophagitis: Secondary | ICD-10-CM | POA: Diagnosis not present

## 2019-08-31 DIAGNOSIS — I700 Atherosclerosis of aorta: Secondary | ICD-10-CM | POA: Diagnosis not present

## 2019-08-31 DIAGNOSIS — J9601 Acute respiratory failure with hypoxia: Secondary | ICD-10-CM | POA: Diagnosis not present

## 2019-09-01 DIAGNOSIS — I5032 Chronic diastolic (congestive) heart failure: Secondary | ICD-10-CM | POA: Diagnosis not present

## 2019-09-01 DIAGNOSIS — I130 Hypertensive heart and chronic kidney disease with heart failure and stage 1 through stage 4 chronic kidney disease, or unspecified chronic kidney disease: Secondary | ICD-10-CM | POA: Diagnosis not present

## 2019-09-01 DIAGNOSIS — N184 Chronic kidney disease, stage 4 (severe): Secondary | ICD-10-CM | POA: Diagnosis not present

## 2019-09-01 DIAGNOSIS — J189 Pneumonia, unspecified organism: Secondary | ICD-10-CM | POA: Diagnosis not present

## 2019-09-01 DIAGNOSIS — I480 Paroxysmal atrial fibrillation: Secondary | ICD-10-CM | POA: Diagnosis not present

## 2019-09-01 DIAGNOSIS — I700 Atherosclerosis of aorta: Secondary | ICD-10-CM | POA: Diagnosis not present

## 2019-09-01 DIAGNOSIS — I2510 Atherosclerotic heart disease of native coronary artery without angina pectoris: Secondary | ICD-10-CM | POA: Diagnosis not present

## 2019-09-01 DIAGNOSIS — Z515 Encounter for palliative care: Secondary | ICD-10-CM | POA: Diagnosis not present

## 2019-09-01 DIAGNOSIS — J9601 Acute respiratory failure with hypoxia: Secondary | ICD-10-CM | POA: Diagnosis not present

## 2019-09-01 DIAGNOSIS — E1122 Type 2 diabetes mellitus with diabetic chronic kidney disease: Secondary | ICD-10-CM | POA: Diagnosis not present

## 2019-09-01 DIAGNOSIS — I5033 Acute on chronic diastolic (congestive) heart failure: Secondary | ICD-10-CM | POA: Diagnosis not present

## 2019-09-03 DIAGNOSIS — I700 Atherosclerosis of aorta: Secondary | ICD-10-CM | POA: Diagnosis not present

## 2019-09-03 DIAGNOSIS — I5033 Acute on chronic diastolic (congestive) heart failure: Secondary | ICD-10-CM | POA: Diagnosis not present

## 2019-09-03 DIAGNOSIS — J189 Pneumonia, unspecified organism: Secondary | ICD-10-CM | POA: Diagnosis not present

## 2019-09-03 DIAGNOSIS — I130 Hypertensive heart and chronic kidney disease with heart failure and stage 1 through stage 4 chronic kidney disease, or unspecified chronic kidney disease: Secondary | ICD-10-CM | POA: Diagnosis not present

## 2019-09-03 DIAGNOSIS — I2510 Atherosclerotic heart disease of native coronary artery without angina pectoris: Secondary | ICD-10-CM | POA: Diagnosis not present

## 2019-09-03 DIAGNOSIS — E1122 Type 2 diabetes mellitus with diabetic chronic kidney disease: Secondary | ICD-10-CM | POA: Diagnosis not present

## 2019-09-03 DIAGNOSIS — J9601 Acute respiratory failure with hypoxia: Secondary | ICD-10-CM | POA: Diagnosis not present

## 2019-09-03 DIAGNOSIS — N184 Chronic kidney disease, stage 4 (severe): Secondary | ICD-10-CM | POA: Diagnosis not present

## 2019-09-03 DIAGNOSIS — I480 Paroxysmal atrial fibrillation: Secondary | ICD-10-CM | POA: Diagnosis not present

## 2019-09-06 DIAGNOSIS — N184 Chronic kidney disease, stage 4 (severe): Secondary | ICD-10-CM | POA: Diagnosis not present

## 2019-09-06 DIAGNOSIS — J189 Pneumonia, unspecified organism: Secondary | ICD-10-CM | POA: Diagnosis not present

## 2019-09-06 DIAGNOSIS — E1122 Type 2 diabetes mellitus with diabetic chronic kidney disease: Secondary | ICD-10-CM | POA: Diagnosis not present

## 2019-09-06 DIAGNOSIS — I130 Hypertensive heart and chronic kidney disease with heart failure and stage 1 through stage 4 chronic kidney disease, or unspecified chronic kidney disease: Secondary | ICD-10-CM | POA: Diagnosis not present

## 2019-09-06 DIAGNOSIS — I5033 Acute on chronic diastolic (congestive) heart failure: Secondary | ICD-10-CM | POA: Diagnosis not present

## 2019-09-06 DIAGNOSIS — I2510 Atherosclerotic heart disease of native coronary artery without angina pectoris: Secondary | ICD-10-CM | POA: Diagnosis not present

## 2019-09-06 DIAGNOSIS — I700 Atherosclerosis of aorta: Secondary | ICD-10-CM | POA: Diagnosis not present

## 2019-09-06 DIAGNOSIS — J9601 Acute respiratory failure with hypoxia: Secondary | ICD-10-CM | POA: Diagnosis not present

## 2019-09-06 DIAGNOSIS — I480 Paroxysmal atrial fibrillation: Secondary | ICD-10-CM | POA: Diagnosis not present

## 2019-09-07 DIAGNOSIS — J9601 Acute respiratory failure with hypoxia: Secondary | ICD-10-CM | POA: Diagnosis not present

## 2019-09-07 DIAGNOSIS — E1122 Type 2 diabetes mellitus with diabetic chronic kidney disease: Secondary | ICD-10-CM | POA: Diagnosis not present

## 2019-09-07 DIAGNOSIS — I130 Hypertensive heart and chronic kidney disease with heart failure and stage 1 through stage 4 chronic kidney disease, or unspecified chronic kidney disease: Secondary | ICD-10-CM | POA: Diagnosis not present

## 2019-09-07 DIAGNOSIS — I2510 Atherosclerotic heart disease of native coronary artery without angina pectoris: Secondary | ICD-10-CM | POA: Diagnosis not present

## 2019-09-07 DIAGNOSIS — I480 Paroxysmal atrial fibrillation: Secondary | ICD-10-CM | POA: Diagnosis not present

## 2019-09-07 DIAGNOSIS — I700 Atherosclerosis of aorta: Secondary | ICD-10-CM | POA: Diagnosis not present

## 2019-09-07 DIAGNOSIS — I5033 Acute on chronic diastolic (congestive) heart failure: Secondary | ICD-10-CM | POA: Diagnosis not present

## 2019-09-07 DIAGNOSIS — J189 Pneumonia, unspecified organism: Secondary | ICD-10-CM | POA: Diagnosis not present

## 2019-09-07 DIAGNOSIS — N184 Chronic kidney disease, stage 4 (severe): Secondary | ICD-10-CM | POA: Diagnosis not present

## 2019-09-08 DIAGNOSIS — J9601 Acute respiratory failure with hypoxia: Secondary | ICD-10-CM | POA: Diagnosis not present

## 2019-09-09 ENCOUNTER — Other Ambulatory Visit: Payer: Self-pay

## 2019-09-09 ENCOUNTER — Inpatient Hospital Stay (HOSPITAL_COMMUNITY)
Admission: EM | Admit: 2019-09-09 | Discharge: 2019-09-13 | DRG: 291 | Disposition: A | Discharge status: Home Health | Payer: Medicare HMO | Attending: Internal Medicine | Admitting: Internal Medicine

## 2019-09-09 ENCOUNTER — Emergency Department (HOSPITAL_COMMUNITY): Payer: Medicare HMO

## 2019-09-09 ENCOUNTER — Encounter (HOSPITAL_COMMUNITY): Payer: Self-pay | Admitting: *Deleted

## 2019-09-09 DIAGNOSIS — E785 Hyperlipidemia, unspecified: Secondary | ICD-10-CM | POA: Diagnosis present

## 2019-09-09 DIAGNOSIS — E1159 Type 2 diabetes mellitus with other circulatory complications: Secondary | ICD-10-CM | POA: Diagnosis not present

## 2019-09-09 DIAGNOSIS — R0600 Dyspnea, unspecified: Secondary | ICD-10-CM | POA: Diagnosis not present

## 2019-09-09 DIAGNOSIS — J9601 Acute respiratory failure with hypoxia: Secondary | ICD-10-CM | POA: Diagnosis not present

## 2019-09-09 DIAGNOSIS — K219 Gastro-esophageal reflux disease without esophagitis: Secondary | ICD-10-CM | POA: Diagnosis not present

## 2019-09-09 DIAGNOSIS — E119 Type 2 diabetes mellitus without complications: Secondary | ICD-10-CM

## 2019-09-09 DIAGNOSIS — Z7901 Long term (current) use of anticoagulants: Secondary | ICD-10-CM

## 2019-09-09 DIAGNOSIS — I10 Essential (primary) hypertension: Secondary | ICD-10-CM | POA: Diagnosis present

## 2019-09-09 DIAGNOSIS — Z9981 Dependence on supplemental oxygen: Secondary | ICD-10-CM

## 2019-09-09 DIAGNOSIS — I480 Paroxysmal atrial fibrillation: Secondary | ICD-10-CM | POA: Diagnosis present

## 2019-09-09 DIAGNOSIS — J90 Pleural effusion, not elsewhere classified: Secondary | ICD-10-CM | POA: Diagnosis not present

## 2019-09-09 DIAGNOSIS — I63431 Cerebral infarction due to embolism of right posterior cerebral artery: Secondary | ICD-10-CM | POA: Diagnosis not present

## 2019-09-09 DIAGNOSIS — D631 Anemia in chronic kidney disease: Secondary | ICD-10-CM | POA: Diagnosis present

## 2019-09-09 DIAGNOSIS — Z8673 Personal history of transient ischemic attack (TIA), and cerebral infarction without residual deficits: Secondary | ICD-10-CM | POA: Diagnosis not present

## 2019-09-09 DIAGNOSIS — E1122 Type 2 diabetes mellitus with diabetic chronic kidney disease: Secondary | ICD-10-CM | POA: Diagnosis present

## 2019-09-09 DIAGNOSIS — Z20822 Contact with and (suspected) exposure to covid-19: Secondary | ICD-10-CM | POA: Diagnosis not present

## 2019-09-09 DIAGNOSIS — Z794 Long term (current) use of insulin: Secondary | ICD-10-CM | POA: Diagnosis not present

## 2019-09-09 DIAGNOSIS — Z823 Family history of stroke: Secondary | ICD-10-CM | POA: Diagnosis not present

## 2019-09-09 DIAGNOSIS — Z66 Do not resuscitate: Secondary | ICD-10-CM | POA: Diagnosis present

## 2019-09-09 DIAGNOSIS — N184 Chronic kidney disease, stage 4 (severe): Secondary | ICD-10-CM | POA: Diagnosis not present

## 2019-09-09 DIAGNOSIS — I482 Chronic atrial fibrillation, unspecified: Secondary | ICD-10-CM | POA: Diagnosis present

## 2019-09-09 DIAGNOSIS — J811 Chronic pulmonary edema: Secondary | ICD-10-CM | POA: Diagnosis not present

## 2019-09-09 DIAGNOSIS — I639 Cerebral infarction, unspecified: Secondary | ICD-10-CM | POA: Diagnosis not present

## 2019-09-09 DIAGNOSIS — I130 Hypertensive heart and chronic kidney disease with heart failure and stage 1 through stage 4 chronic kidney disease, or unspecified chronic kidney disease: Principal | ICD-10-CM | POA: Diagnosis present

## 2019-09-09 DIAGNOSIS — I5033 Acute on chronic diastolic (congestive) heart failure: Secondary | ICD-10-CM | POA: Diagnosis present

## 2019-09-09 DIAGNOSIS — Z79899 Other long term (current) drug therapy: Secondary | ICD-10-CM | POA: Diagnosis not present

## 2019-09-09 DIAGNOSIS — I5032 Chronic diastolic (congestive) heart failure: Secondary | ICD-10-CM | POA: Diagnosis not present

## 2019-09-09 DIAGNOSIS — D649 Anemia, unspecified: Secondary | ICD-10-CM

## 2019-09-09 DIAGNOSIS — I110 Hypertensive heart disease with heart failure: Secondary | ICD-10-CM | POA: Diagnosis not present

## 2019-09-09 DIAGNOSIS — F339 Major depressive disorder, recurrent, unspecified: Secondary | ICD-10-CM | POA: Diagnosis present

## 2019-09-09 LAB — BASIC METABOLIC PANEL
Anion gap: 10 (ref 5–15)
BUN: 55 mg/dL — ABNORMAL HIGH (ref 8–23)
CO2: 33 mmol/L — ABNORMAL HIGH (ref 22–32)
Calcium: 9.2 mg/dL (ref 8.9–10.3)
Chloride: 87 mmol/L — ABNORMAL LOW (ref 98–111)
Creatinine, Ser: 2.95 mg/dL — ABNORMAL HIGH (ref 0.44–1.00)
GFR calc Af Amer: 16 mL/min — ABNORMAL LOW (ref >60)
GFR calc non Af Amer: 14 mL/min — ABNORMAL LOW (ref >60)
Glucose, Bld: 408 mg/dL — ABNORMAL HIGH (ref 70–99)
Potassium: 3.9 mmol/L (ref 3.5–5.1)
Sodium: 130 mmol/L — ABNORMAL LOW (ref 135–145)

## 2019-09-09 LAB — CBC WITH DIFFERENTIAL/PLATELET
Abs Immature Granulocytes: 0.05 10*3/uL (ref 0.00–0.07)
Basophils Absolute: 0 10*3/uL (ref 0.0–0.1)
Basophils Relative: 0 %
Eosinophils Absolute: 0.4 10*3/uL (ref 0.0–0.5)
Eosinophils Relative: 5 %
HCT: 24.2 % — ABNORMAL LOW (ref 36.0–46.0)
Hemoglobin: 6.7 g/dL — CL (ref 12.0–15.0)
Immature Granulocytes: 1 %
Lymphocytes Relative: 9 %
Lymphs Abs: 0.7 10*3/uL (ref 0.7–4.0)
MCH: 24 pg — ABNORMAL LOW (ref 26.0–34.0)
MCHC: 27.7 g/dL — ABNORMAL LOW (ref 30.0–36.0)
MCV: 86.7 fL (ref 80.0–100.0)
Monocytes Absolute: 0.7 10*3/uL (ref 0.1–1.0)
Monocytes Relative: 10 %
Neutro Abs: 5.6 10*3/uL (ref 1.7–7.7)
Neutrophils Relative %: 75 %
Platelets: 416 10*3/uL — ABNORMAL HIGH (ref 150–400)
RBC: 2.79 MIL/uL — ABNORMAL LOW (ref 3.87–5.11)
RDW: 15.6 % — ABNORMAL HIGH (ref 11.5–15.5)
WBC: 7.5 10*3/uL (ref 4.0–10.5)
nRBC: 0 % (ref 0.0–0.2)

## 2019-09-09 LAB — ABO/RH: ABO/RH(D): O POS

## 2019-09-09 LAB — MAGNESIUM: Magnesium: 2.2 mg/dL (ref 1.7–2.4)

## 2019-09-09 LAB — SARS CORONAVIRUS 2 BY RT PCR (HOSPITAL ORDER, PERFORMED IN ~~LOC~~ HOSPITAL LAB): SARS Coronavirus 2: NEGATIVE

## 2019-09-09 LAB — PREPARE RBC (CROSSMATCH)

## 2019-09-09 LAB — BRAIN NATRIURETIC PEPTIDE: B Natriuretic Peptide: 306 pg/mL — ABNORMAL HIGH (ref 0.0–100.0)

## 2019-09-09 MED ORDER — POTASSIUM CHLORIDE CRYS ER 20 MEQ PO TBCR
20.0000 meq | EXTENDED_RELEASE_TABLET | Freq: Every day | ORAL | Status: DC
  Administered 2019-09-10 – 2019-09-13 (×4): 20 meq via ORAL
  Filled 2019-09-09 (×4): qty 1

## 2019-09-09 MED ORDER — ACETAMINOPHEN 650 MG RE SUPP: 650.0000 mg | Freq: Four times a day (QID) | RECTAL | Status: DC | PRN

## 2019-09-09 MED ORDER — IPRATROPIUM-ALBUTEROL 0.5-2.5 (3) MG/3ML IN SOLN: 3.0000 mL | Freq: Four times a day (QID) | RESPIRATORY_TRACT | Status: DC

## 2019-09-09 MED ORDER — FUROSEMIDE 10 MG/ML IJ SOLN
80.0000 mg | Freq: Once | INTRAMUSCULAR | Status: AC
  Administered 2019-09-09: 80 mg via INTRAVENOUS
  Filled 2019-09-09: qty 8

## 2019-09-09 MED ORDER — FUROSEMIDE 10 MG/ML IJ SOLN
60.0000 mg | Freq: Two times a day (BID) | INTRAMUSCULAR | Status: DC
  Administered 2019-09-10 – 2019-09-11 (×3): 60 mg via INTRAVENOUS
  Filled 2019-09-09 (×3): qty 6

## 2019-09-09 MED ORDER — DILTIAZEM HCL ER COATED BEADS 120 MG PO CP24
120.0000 mg | ORAL_CAPSULE | Freq: Every day | ORAL | Status: DC
  Administered 2019-09-10 – 2019-09-13 (×4): 120 mg via ORAL
  Filled 2019-09-09 (×4): qty 1

## 2019-09-09 MED ORDER — ATORVASTATIN CALCIUM 40 MG PO TABS
40.0000 mg | ORAL_TABLET | Freq: Every evening | ORAL | Status: DC
  Administered 2019-09-10 – 2019-09-12 (×3): 40 mg via ORAL
  Filled 2019-09-09 (×3): qty 1

## 2019-09-09 MED ORDER — INSULIN GLARGINE 100 UNIT/ML ~~LOC~~ SOLN
30.0000 [IU] | Freq: Every day | SUBCUTANEOUS | Status: DC
  Administered 2019-09-10 – 2019-09-12 (×4): 30 [IU] via SUBCUTANEOUS
  Filled 2019-09-09 (×5): qty 0.3

## 2019-09-09 MED ORDER — BUPROPION HCL ER (XL) 300 MG PO TB24
300.0000 mg | ORAL_TABLET | Freq: Every day | ORAL | Status: DC
  Administered 2019-09-10 – 2019-09-13 (×4): 300 mg via ORAL
  Filled 2019-09-09 (×4): qty 1

## 2019-09-09 MED ORDER — SODIUM CHLORIDE 0.9 % IV SOLN: 10.0000 mL/h | Freq: Once | INTRAVENOUS | Status: DC

## 2019-09-09 MED ORDER — ACETAMINOPHEN 325 MG PO TABS
650.0000 mg | ORAL_TABLET | Freq: Four times a day (QID) | ORAL | Status: DC | PRN
  Administered 2019-09-13: 650 mg via ORAL
  Filled 2019-09-09: qty 2

## 2019-09-09 MED ORDER — MAGNESIUM OXIDE 400 (241.3 MG) MG PO TABS
400.0000 mg | ORAL_TABLET | Freq: Every evening | ORAL | Status: DC
  Administered 2019-09-10 – 2019-09-12 (×4): 400 mg via ORAL
  Filled 2019-09-09 (×4): qty 1

## 2019-09-09 MED ORDER — APIXABAN 2.5 MG PO TABS
2.5000 mg | ORAL_TABLET | Freq: Two times a day (BID) | ORAL | Status: DC
  Administered 2019-09-10 – 2019-09-13 (×8): 2.5 mg via ORAL
  Filled 2019-09-09 (×8): qty 1

## 2019-09-09 MED ORDER — TRAZODONE HCL 50 MG PO TABS
75.0000 mg | ORAL_TABLET | Freq: Every day | ORAL | Status: DC
  Administered 2019-09-10 – 2019-09-12 (×4): 75 mg via ORAL
  Filled 2019-09-09 (×4): qty 2

## 2019-09-09 MED ORDER — PANTOPRAZOLE SODIUM 40 MG PO TBEC
40.0000 mg | DELAYED_RELEASE_TABLET | Freq: Every day | ORAL | Status: DC
  Administered 2019-09-10 – 2019-09-13 (×4): 40 mg via ORAL
  Filled 2019-09-09 (×4): qty 1

## 2019-09-09 MED ORDER — CITALOPRAM HYDROBROMIDE 20 MG PO TABS
20.0000 mg | ORAL_TABLET | Freq: Every day | ORAL | Status: DC
  Administered 2019-09-10 – 2019-09-13 (×4): 20 mg via ORAL
  Filled 2019-09-09 (×4): qty 1

## 2019-09-09 MED ORDER — INSULIN ASPART 100 UNIT/ML ~~LOC~~ SOLN
0.0000 [IU] | Freq: Every day | SUBCUTANEOUS | Status: DC
  Administered 2019-09-10: 2 [IU] via SUBCUTANEOUS
  Administered 2019-09-10 – 2019-09-11 (×2): 3 [IU] via SUBCUTANEOUS
  Administered 2019-09-12: 4 [IU] via SUBCUTANEOUS

## 2019-09-09 MED ORDER — INSULIN ASPART 100 UNIT/ML ~~LOC~~ SOLN
0.0000 [IU] | Freq: Three times a day (TID) | SUBCUTANEOUS | Status: DC
  Administered 2019-09-10 (×2): 2 [IU] via SUBCUTANEOUS
  Administered 2019-09-10: 8 [IU] via SUBCUTANEOUS
  Administered 2019-09-11: 5 [IU] via SUBCUTANEOUS
  Administered 2019-09-11: 3 [IU] via SUBCUTANEOUS
  Administered 2019-09-11: 2 [IU] via SUBCUTANEOUS
  Administered 2019-09-12: 5 [IU] via SUBCUTANEOUS
  Administered 2019-09-12: 3 [IU] via SUBCUTANEOUS
  Administered 2019-09-12: 11 [IU] via SUBCUTANEOUS
  Administered 2019-09-13: 5 [IU] via SUBCUTANEOUS

## 2019-09-09 MED ORDER — POLYETHYLENE GLYCOL 3350 17 G PO PACK: 17.0000 g | PACK | Freq: Every day | ORAL | Status: DC | PRN

## 2019-09-09 MED ORDER — ONDANSETRON HCL 4 MG PO TABS: 4.0000 mg | ORAL_TABLET | Freq: Four times a day (QID) | ORAL | Status: DC | PRN

## 2019-09-09 MED ORDER — AMIODARONE HCL 200 MG PO TABS
200.0000 mg | ORAL_TABLET | Freq: Every day | ORAL | Status: DC
  Administered 2019-09-10 – 2019-09-13 (×4): 200 mg via ORAL
  Filled 2019-09-09 (×4): qty 1

## 2019-09-09 MED ORDER — ONDANSETRON HCL 4 MG/2ML IJ SOLN: 4.0000 mg | Freq: Four times a day (QID) | INTRAMUSCULAR | Status: DC | PRN

## 2019-09-09 NOTE — ED Provider Notes (Addendum)
[facility] EMERGENCY DEPARTMENT Provider Note   CSN: [id number] Arrival date & time: 09/09/19  1627     History Chief Complaint  Patient presents with  . Anemia    Amy Wiggins is a 86 y.o. female.  Patient sent in from Dr. Ria Comment office for a low hemoglobin. Patient's also had some increased bilateral leg swelling. Patient on 2 L of oxygen at all times now. Patient was admitted the end of May for an exacerbation of congestive heart failure and also multifocal pneumonia. Patient had a stent at [facility]. Just recently got out of there. Patient denies any blood in her bowel movements. Family member states that she has had an increase in her Demadex dose for the past few days. Patient is on Eliquis and patient is on Pacerone. During her hospitalization Covid testing was negative. Past medical history sniffing for hypertension diabetes atrial fibrillation hyperlipidemia. And  diastolic heart failure. Patient denies any chest pain. Denies any blood in her bowel movements.        Past Medical History:  Diagnosis Date  . Chronic anticoagulation   . Depression   . Diabetes mellitus   . Hyperlipidemia   . Hypertension   . PAF (paroxysmal atrial fibrillation) ([place])   . PFO (patent foramen ovale)   . Stroke ([place])    TIA's x 2    Patient Active Problem List   Diagnosis Date Noted  . HCAP (healthcare-associated pneumonia) 08/26/2019  . Hypertension associated with stage 4 chronic kidney disease due to type 2 diabetes mellitus ([place]) 08/04/2019  . Hyperlipidemia associated with type 2 diabetes mellitus ([place]) 08/04/2019  . CKD stage 4 due to type 2 diabetes mellitus ([place]) 08/04/2019  . GERD without esophagitis 08/04/2019  . Chronic constipation 08/04/2019  . Major depression, recurrent, chronic ([place]) 08/04/2019  . Hypomagnesemia 08/04/2019  . Ground glass opacity present on imaging of lung 08/04/2019  . Goals of care, counseling/discussion   . Palliative care by specialist    . Acute on chronic diastolic (congestive) heart failure ([place]) 07/24/2019  . Acute on chronic respiratory failure with hypoxia ([place]) 09/29/2018  . Atrial fibrillation with RVR ([place])   . Chronic a-fib with RVR 09/28/2018  . Cerebellar infarct ([place])   . Stroke ([place]) 02/22/2016  . HFpEF/Chronic diastolic congestive heart failure ([place]) 02/22/2016  . Shortness of breath 01/14/2016  . Cerebrovascular accident (CVA) due to embolism of right posterior cerebral artery ([place]) 08/08/2015  . Essential hypertension 08/08/2015  . HLD (hyperlipidemia) 08/08/2015  . Type 2 diabetes mellitus with circulatory disorder ([place]) 08/08/2015  . PFO (patent foramen ovale)   . Hyperlipidemia 06/02/2015  . CKD (chronic kidney disease) stage 4, GFR 15-29 ml/min (HCC) 06/02/2015  . DM type 2 (diabetes mellitus, type 2) ([place]) 06/02/2015  . Leukocytosis 06/02/2015  . Muscle weakness (generalized) 09/16/2012  . Pain in joint, shoulder region 09/16/2012    Past Surgical History:  Procedure Laterality Date  . COLONOSCOPY    . COLONOSCOPY N/A 09/09/2014   Procedure: COLONOSCOPY;  Surgeon: Rogene Houston, MD;  [facility];  Service: Endoscopy;  Laterality: N/A;  1010 - moved to 7:30 - Ann to notify  . EP IMPLANTABLE DEVICE N/A 06/07/2015   Procedure: Loop Recorder Insertion;  Surgeon: Deboraha Sprang, MD;  Location: [place] [facility];  Service: Cardiovascular;  Laterality: N/A;  . TEE WITHOUT CARDIOVERSION N/A 06/07/2015   Procedure: TRANSESOPHAGEAL ECHOCARDIOGRAM (TEE);  Surgeon: Thayer Headings, MD;  Location: [facility];  Service:  Cardiovascular;  Laterality: N/A;     OB History   No obstetric history on file.     Family History  Problem Relation Age of Onset  . Stroke Father   . Dementia Sister     Social History   Tobacco Use  . Smoking status: Never Smoker  . Smokeless tobacco: Never Used  Vaping Use  . Vaping Use: Never used  Substance Use Topics  . Alcohol use: No  . Drug use: No     Home Medications Prior to Admission medications   Medication Sig Start Date End Date Taking? Authorizing Provider  acetaminophen (TYLENOL) 500 MG tablet Take 1,000 mg by mouth at bedtime. may also take 2 tablets during the day as needed for pain 08/03/19   [provider]  amiodarone (PACERONE) 200 MG tablet Take 1 tablet (200 mg total) by mouth daily. 08/26/19   Gerlene Fee, NP  atorvastatin (LIPITOR) 20 MG tablet Take 2 tablets (40 mg total) by mouth every evening. 4pm 08/26/19   Gerlene Fee, NP  buPROPion (WELLBUTRIN XL) 300 MG 24 hr tablet Take 1 tablet (300 mg total) by mouth daily. 08/26/19   Gerlene Fee, NP  citalopram (CELEXA) 20 MG tablet Take 1 tablet (20 mg total) by mouth daily. 08/26/19   Gerlene Fee, NP  diltiazem (CARDIZEM CD) 120 MG 24 hr capsule Take 1 capsule (120 mg total) by mouth daily. 08/26/19   Gerlene Fee, NP  docusate sodium (COLACE) 100 MG capsule Take 100 mg by mouth daily. 08/04/19   [provider]  ELIQUIS 2.5 MG TABS tablet Take 1 tablet (2.5 mg total) by mouth 2 (two) times daily. 08/26/19   Gerlene Fee, NP  insulin aspart (NOVOLOG FLEXPEN) 100 UNIT/ML FlexPen Inject 10 Units into the skin 3 (three) times daily with meals. insulin pen; 100 unit/mL (3 mL); amt: 10 units; subcutaneous Special Instructions: admin 5 units for CBG >150 Before Meals 08:00 AM, 11:30 AM, 05:00 PM 08/26/19   Nyoka Cowden, Phylis Bougie, NP  insulin glargine (LANTUS) 100 UNIT/ML injection Inject 0.3 mLs (30 Units total) into the skin at bedtime. 08/26/19   Gerlene Fee, NP  ipratropium-albuterol (DUONEB) 0.5-2.5 (3) MG/3ML SOLN Take 3 mLs by nebulization every 6 (six) hours. 08/26/19   Gerlene Fee, NP  Magnesium Oxide 250 MG TABS Take 2 tablets (500 mg total) by mouth every evening. 08/26/19   Gerlene Fee, NP  Multiple Vitamin (MULTIVITAMIN WITH MINERALS) TABS tablet Take 1 tablet by mouth at bedtime. For supplement    [provider]  Multiple  Vitamins-Minerals (PRESERVISION AREDS 2) CAPS Take 1 capsule by mouth 2 (two) times daily.    [provider]  OXYGEN Inhale 2 L into the lungs continuous. 08/03/19   [provider]  pantoprazole (PROTONIX) 40 MG tablet Take 1 tablet (40 mg total) by mouth daily. For acid reflux/gerd 08/26/19   Gerlene Fee, NP  potassium chloride SA (KLOR-CON) 20 MEQ tablet Take 1 tablet (20 mEq total) by mouth daily. 08/26/19   Gerlene Fee, NP  torsemide (DEMADEX) 20 MG tablet Take 2 tablets (40 mg total) by mouth daily. 08/26/19   Gerlene Fee, NP  traZODone (DESYREL) 150 MG tablet Take 0.5 tablets (75 mg total) by mouth at bedtime. 0.5 tablet to = 75 mg 08/26/19   Gerlene Fee, NP    Allergies    Patient has no known allergies.  Review of Systems  Review of Systems  Constitutional: Negative for chills and fever.  HENT: Negative for congestion, rhinorrhea and sore throat.   Eyes: Negative for visual disturbance.  Respiratory: Positive for shortness of breath. Negative for cough.   Cardiovascular: Positive for leg swelling. Negative for chest pain.  Gastrointestinal: Negative for abdominal pain, diarrhea, nausea and vomiting.  Genitourinary: Negative for dysuria.  Musculoskeletal: Negative for back pain and neck pain.  Skin: Negative for rash.  Neurological: Negative for dizziness, light-headedness and headaches.  Hematological: Does not bruise/bleed easily.  Psychiatric/Behavioral: Negative for confusion.    Physical Exam Updated Vital Signs BP (!) 145/62   Pulse 79   Temp 98 F (36.7 C) (Oral)   Resp (!) 24   SpO2 92%   Physical Exam Vitals and nursing note reviewed.  Constitutional:      General: She is not in acute distress.    Appearance: Normal appearance. She is well-developed.  HENT:     Head: Normocephalic and atraumatic.  Eyes:     Conjunctiva/sclera: Conjunctivae normal.     Pupils: Pupils are equal, round, and reactive to light.  Cardiovascular:      Rate and Rhythm: Normal rate and regular rhythm.     Heart sounds: No murmur heard.   Pulmonary:     Effort: Pulmonary effort is normal. No respiratory distress.     Breath sounds: Normal breath sounds. No wheezing.  Abdominal:     Palpations: Abdomen is soft.     Tenderness: There is no abdominal tenderness.  Genitourinary:    Rectum: Guaiac result negative.     Comments: No large amount of stool in the vault. No gross blood. Hemoccult negative. Does have evidence of skin tags perianal suggestive of prior external hemorrhoids. No evidence of any prolapsed internal hemorrhoids. Musculoskeletal:     Cervical back: Normal range of motion and neck supple.     Right lower leg: Edema present.     Left lower leg: Edema present.     Comments: Marked lower extremity edema significant pitting edema.  Skin:    General: Skin is warm and dry.  Neurological:     General: No focal deficit present.     Mental Status: She is alert and oriented to person, place, and time.     Cranial Nerves: No cranial nerve deficit.     Sensory: No sensory deficit.     ED Results / Procedures / Treatments   Labs (all labs ordered are listed, but only abnormal results are displayed) Labs Reviewed  CBC WITH DIFFERENTIAL/PLATELET - Abnormal; Notable for the following components:      Result Value   RBC 2.79 (*)    Hemoglobin 6.7 (*)    HCT 24.2 (*)    MCH 24.0 (*)    MCHC 27.7 (*)    RDW 15.6 (*)    Platelets 416 (*)    All other components within normal limits  BASIC METABOLIC PANEL - Abnormal; Notable for the following components:   Sodium 130 (*)    Chloride 87 (*)    CO2 33 (*)    Glucose, Bld 408 (*)    BUN 55 (*)    Creatinine, Ser 2.95 (*)    GFR calc non Af Amer 14 (*)    GFR calc Af Amer 16 (*)    All other components within normal limits  BRAIN NATRIURETIC PEPTIDE - Abnormal; Notable for the following components:   B Natriuretic Peptide 306.0 (*)    All other  components within normal  limits  SARS CORONAVIRUS 2 BY RT PCR (HOSPITAL ORDER, [place] [facility])  POC OCCULT BLOOD, ED  TYPE AND SCREEN  ABO/RH  PREPARE RBC (CROSSMATCH)    EKG None  Radiology DG Chest Port 1 View  Result Date: 09/09/2019 CLINICAL DATA:  Dyspnea EXAM: PORTABLE CHEST 1 VIEW COMPARISON:  07/30/2019 FINDINGS: The lungs are symmetrically expanded. There has developed moderate interstitial pulmonary edema and small bilateral pleural effusions in keeping with changes of moderate cardiogenic failure. No pneumothorax. Cardiac size is mildly enlarged, unchanged. Implanted loop recorder device again noted overlying the inferior left hemithorax. No acute bone abnormality. IMPRESSION: Moderate cardiogenic failure. Electronically Signed   By: Fidela Salisbury MD   On: 09/09/2019 20:52    Procedures Procedures (including critical care time)  CRITICAL CARE Performed by: Fredia Sorrow Total critical care time: 60 minutes Critical care time was exclusive of separately billable procedures and treating other patients. Critical care was necessary to treat or prevent imminent or life-threatening deterioration. Critical care was time spent personally by me on the following activities: development of treatment plan with patient and/or surrogate as well as nursing, discussions with consultants, evaluation of patient's response to treatment, examination of patient, obtaining history from patient or surrogate, ordering and performing treatments and interventions, ordering and review of laboratory studies, ordering and review of radiographic studies, pulse oximetry and re-evaluation of patient's condition.   Medications Ordered in ED Medications  0.9 %  sodium chloride infusion (has no administration in time range)  furosemide (LASIX) injection 80 mg (has no administration in time range)    ED Course  I have reviewed the triage vital signs and the nursing notes.  Pertinent labs & imaging  results that were available during my care of the patient were reviewed by me and considered in my medical decision making (see chart for details).    MDM Rules/Calculators/A&P                         Patient with significant anemia here hemoglobin 6.7. Will require blood transfusion. No evidence of GI blood loss on exam.  Patient with known history of diastolic heart failure. Appears to have acute on chronic heart failure here today. Significant leg swelling. Moderate amount of pulmonary edema on the chest x-ray. Covid testing pending.  Patient will be given 80 mg of Lasix. EKG pending. No chest pain.  Discussed with hospitalist for admission. Patient will probably require the blood transfusion to be done very slowly because of the pulmonary edema.  Elevated blood sugar but no evidence of acidosis. BUN and creatinine is elevated. But it is at baseline. BNP elevated at 306. Patient's oxygen saturations on her 2 L is about 92%.    Final Clinical Impression(s) / ED Diagnoses Final diagnoses:  Anemia, unspecified type  Acute on chronic diastolic congestive heart failure [facility])    Rx / DC Orders ED Discharge Orders    None       Fredia Sorrow, MD 09/09/19 2101    Fredia Sorrow, MD 09/09/19 2135    Fredia Sorrow, MD 09/09/19 2135

## 2019-09-09 NOTE — H&P (Addendum)
History and Physical    Amy Wiggins QPY:[id number] DOB: 1931-07-22 DOA: 09/09/2019  PCP: Asencion Noble, MD   Patient coming from: Home  I have personally briefly reviewed patient's old medical records in [place]  Chief Complaint: Leg swelling, low hemoglobin.  HPI: Amy Wiggins is a 86 y.o. female with medical history significant for diastolic CHF, atrial fibrillation on chronic anticoagulation, CVA, diabetes mellitus, hypertension.  Primary care provider to come to the ED today after blood work drawn on Tuesday- 7/13, showed low hemoglobin.  Patient reports difficulty breathing, worse with activity, progressive lower extremity swelling with occasional weeping from legs, since she was discharged from nursing home just about 2 weeks ago.  She also reports abdominal bloating.  Daughter-in-law present at bedside reports increasing weight. No chest pain.  She denies vomiting of blood, no blood in stools, no black stools, no abdominal pain.  She is compliant with her Eliquis.  Due to lower extremity swelling, her dose of torsemide was increased, she is currently completing 1/3-day of 40 mg of torsemide twice daily.  Recent Hospitalization 5/29 to 08/03/2019-with hypoxia secondary to decompensated CHF, also pneumonia treated with cefepime.  Patient was diuresed, net -5.6 L on discharge.  Was discharged to nursing home.  Was subsequently recently discharged back home- ~~~ July 2nd.  On discharge from nursing home, patient was discharged with oxygen, daughter-in-law reports patient was on 4 L of oxygen.  ED Course: Sats 92% on 3 L nasal cannula, heart rate 79, respiratory 24, blood pressure 145/62.  WBC 7.5.  Hemoglobin 6.7.  Creatinine 2.95.  Sodium 130.  BNP elevated 306.  Portable chest x-ray showed moderate cardiogenic failure.  PRBC ordered for transfusion.  80 mg IV Lasix given x1.  Hospitalist to admit for acute on chronic anemia and decompensated CHF.  Review of Systems: As per  HPI all other systems reviewed and negative.  Past Medical History:  Diagnosis Date  . Chronic anticoagulation   . Depression   . Diabetes mellitus   . Hyperlipidemia   . Hypertension   . PAF (paroxysmal atrial fibrillation) ([place])   . PFO (patent foramen ovale)   . Stroke ([place])    TIA's x 2    Past Surgical History:  Procedure Laterality Date  . COLONOSCOPY    . COLONOSCOPY N/A 09/09/2014   Procedure: COLONOSCOPY;  Surgeon: Rogene Houston, MD;  Location[facility];  Service: Endoscopy;  Laterality: N/A;  1010 - moved to 7:30 - Ann to notify  . EP IMPLANTABLE DEVICE N/A 06/07/2015   Procedure: Loop Recorder Insertion;  Surgeon: Deboraha Sprang, MD;  Location: [facility] [facility];  Service: Cardiovascular;  Laterality: N/A;  . TEE WITHOUT CARDIOVERSION N/A 06/07/2015   Procedure: TRANSESOPHAGEAL ECHOCARDIOGRAM (TEE);  Surgeon: Thayer Headings, MD;  Location: [facility];  Service: Cardiovascular;  Laterality: N/A;     reports that she has never smoked. She has never used smokeless tobacco. She reports that she does not drink alcohol and does not use drugs.  No Known Allergies  Family History  Problem Relation Age of Onset  . Stroke Father   . Dementia Sister     Prior to Admission medications   Medication Sig Start Date End Date Taking? Authorizing Provider  acetaminophen (TYLENOL) 500 MG tablet Take 1,000 mg by mouth at bedtime. may also take 2 tablets during the day as needed for pain 08/03/19   [provider]  amiodarone (PACERONE) 200 MG tablet Take 1  tablet (200 mg total) by mouth daily. 08/26/19   Gerlene Fee, NP  atorvastatin (LIPITOR) 20 MG tablet Take 2 tablets (40 mg total) by mouth every evening. 4pm 08/26/19   Gerlene Fee, NP  buPROPion (WELLBUTRIN XL) 300 MG 24 hr tablet Take 1 tablet (300 mg total) by mouth daily. 08/26/19   Gerlene Fee, NP  citalopram (CELEXA) 20 MG tablet Take 1 tablet (20 mg total) by mouth daily. 08/26/19   Gerlene Fee,  NP  diltiazem (CARDIZEM CD) 120 MG 24 hr capsule Take 1 capsule (120 mg total) by mouth daily. 08/26/19   Gerlene Fee, NP  docusate sodium (COLACE) 100 MG capsule Take 100 mg by mouth daily. 08/04/19   [provider]  ELIQUIS 2.5 MG TABS tablet Take 1 tablet (2.5 mg total) by mouth 2 (two) times daily. 08/26/19   Gerlene Fee, NP  insulin aspart (NOVOLOG FLEXPEN) 100 UNIT/ML FlexPen Inject 10 Units into the skin 3 (three) times daily with meals. insulin pen; 100 unit/mL (3 mL); amt: 10 units; subcutaneous Special Instructions: admin 5 units for CBG >150 Before Meals 08:00 AM, 11:30 AM, 05:00 PM 08/26/19   Nyoka Cowden, Phylis Bougie, NP  insulin glargine (LANTUS) 100 UNIT/ML injection Inject 0.3 mLs (30 Units total) into the skin at bedtime. 08/26/19   Gerlene Fee, NP  ipratropium-albuterol (DUONEB) 0.5-2.5 (3) MG/3ML SOLN Take 3 mLs by nebulization every 6 (six) hours. 08/26/19   Gerlene Fee, NP  Magnesium Oxide 250 MG TABS Take 2 tablets (500 mg total) by mouth every evening. 08/26/19   Gerlene Fee, NP  Multiple Vitamin (MULTIVITAMIN WITH MINERALS) TABS tablet Take 1 tablet by mouth at bedtime. For supplement    [provider]  Multiple Vitamins-Minerals (PRESERVISION AREDS 2) CAPS Take 1 capsule by mouth 2 (two) times daily.    [provider]  OXYGEN Inhale 2 L into the lungs continuous. 08/03/19   [provider]  pantoprazole (PROTONIX) 40 MG tablet Take 1 tablet (40 mg total) by mouth daily. For acid reflux/gerd 08/26/19   Gerlene Fee, NP  potassium chloride SA (KLOR-CON) 20 MEQ tablet Take 1 tablet (20 mEq total) by mouth daily. 08/26/19   Gerlene Fee, NP  torsemide (DEMADEX) 20 MG tablet Take 2 tablets (40 mg total) by mouth daily. 08/26/19   Gerlene Fee, NP  traZODone (DESYREL) 150 MG tablet Take 0.5 tablets (75 mg total) by mouth at bedtime. 0.5 tablet to = 75 mg 08/26/19   Gerlene Fee, NP    Physical Exam: Vitals:   09/09/19 1843  BP: (!)  145/62  Pulse: 79  Resp: (!) 24  Temp: 98 F (36.7 C)  TempSrc: Oral  SpO2: 92%    Constitutional: NAD, calm, comfortable Vitals:   09/09/19 1843  BP: (!) 145/62  Pulse: 79  Resp: (!) 24  Temp: 98 F (36.7 C)  TempSrc: Oral  SpO2: 92%   Eyes: PERRL, lids and conjunctivae normal ENMT: Mucous membranes are moist. Posterior pharynx clear of any exudate or lesions.Normal dentition.  Neck: normal, supple, no masses, no thyromegaly Respiratory: 3 L O2, normal respiratory effort. No accessory muscle use.  Cardiovascular: Regular rate and rhythm, no murmurs / rubs / gallops.  3+ pitting extremity edema to knees, .  Bilateral lower extremities warm and well perfused. Abdomen: Distended but not tense,  no masses palpated. No hepatosplenomegaly. Bowel sounds positive.  Musculoskeletal: no clubbing / cyanosis. No joint deformity  upper and lower extremities. Good ROM, no contractures. Normal muscle tone.  Skin: no rashes, lesions, ulcers. No induration Neurologic: No apparent cranial abnormality, moving all extremities spontaneously.Marland Kitchen  Psychiatric: Normal judgment and insight. Alert and oriented x 3. Normal mood.   Labs on Admission: I have personally reviewed following labs and imaging studies  CBC: Recent Labs  Lab 09/09/19 1916  WBC 7.5  NEUTROABS 5.6  HGB 6.7*  HCT 24.2*  MCV 86.7  PLT 654*   Basic Metabolic Panel: Recent Labs  Lab 09/09/19 1916  NA 130*  K 3.9  CL 87*  CO2 33*  GLUCOSE 408*  BUN 55*  CREATININE 2.95*  CALCIUM 9.2    Radiological Exams on Admission: DG Chest Port 1 View  Result Date: 09/09/2019 CLINICAL DATA:  Dyspnea EXAM: PORTABLE CHEST 1 VIEW COMPARISON:  07/30/2019 FINDINGS: The lungs are symmetrically expanded. There has developed moderate interstitial pulmonary edema and small bilateral pleural effusions in keeping with changes of moderate cardiogenic failure. No pneumothorax. Cardiac size is mildly enlarged, unchanged. Implanted loop  recorder device again noted overlying the inferior left hemithorax. No acute bone abnormality. IMPRESSION: Moderate cardiogenic failure. Electronically Signed   By: Fidela Salisbury MD   On: 09/09/2019 20:52    EKG: Independently reviewed.   Assessment/Plan Principal Problem:   Acute on chronic anemia Active Problems:   Acute on chronic diastolic (congestive) heart failure (HCC)   CKD (chronic kidney disease) stage 4, GFR 15-29 ml/min (HCC)   DM type 2 (diabetes mellitus, type 2) ([place])   Cerebrovascular accident (CVA) due to embolism of right posterior cerebral artery ([place])   Essential hypertension   Type 2 diabetes mellitus with circulatory disorder (HCC)   Major depression, recurrent, chronic (HCC)   Acute on chronic anemia -hemoglobin 6.7, baseline over the past month ~ 7-8.  No obvious GI blood loss source.  Anemia panel on recent hospitalization 07/31/2019 suggest anemia of chronic disease, likely from chronic kidney disease. -Stool FOBT pending -Transfuse 1 unit PRBC, caution with transfusions as patient appears volume overloaded -CBC in the morning -As no apparent GI blood loss, and as this is a chronic issue, will continue Eliquis. -Would likely benefit from Aranesp/Epo  Acute on chronic diastolic CHF-dyspnea, bilateral lower extremity swelling, chest x-ray showing moderate cardiogenic failure, BNP elevated compared to prior at 306.  Recent echo 07/2019 shows EF of 60 to 65%.  Anemia likely contributing to decompensation.  Recent dose adjustment of her diuretic- torsemide, now on 40 mg twice daily.  No chest pain.   -Obtain EKG -  IV Lasix 80 mg x 1 given in the ED, continue 60 mg every 12 hourly - Daily BMP - Strict input output, daily weights - Fluid restriction -Resume home K-Dur 50meq daily supplements.  Chronic respiratory failure-discharge from nursing home with home O2, at some point required up to 4 L, currently on 3 L with sats of 92%.  Likely secondary to decompensated  CHF. -Supplemental O2, diuresis  Diabetes mellitus-random glucose 408, with normal anion gap of 10, serum bicarb of 33.  Hgba1c 06/2019- 8.4. - SSI- M -Resume home Lantus 30 units nightly  Hypertension-stable. -Resume diltiazem, diurese  CKD 4-creatinine 2.95, close to baseline about 2.7. -IV diuresis  Atrial fibrillation-rate controlled and on anticoagulation with Eliquis. -Resume Pacerone, diltiazem -Resume Eliquis  Depression -Resume Celexa, bupropion.  Dyslipidemia -Resume statins  DVT prophylaxis: Eliquis Code Status: DNR, confirmed with patient and daughter-in-law at bedside, consistent with prior documentation in chart. Family Communication: Daughter  in law at bedside, all questions answered, plan of care explained. Disposition Plan: > 2 days, ending improvement in cardiorespiratory status with diuresis and transfusion. Consults called: None. Admission status: Inpatient, telemetry I certify that at the point of admission it is my clinical judgment that the patient will require inpatient hospital care spanning beyond 2 midnights from the point of admission due to high intensity of service, high risk for further deterioration and high frequency of surveillance required. The following factors support the patient status of inpatient: Decompensated CHF and acute anemia requiring transfusions, close monitoring while diuresing.   Bethena Roys MD [facility]  09/09/2019, 10:35 PM

## 2019-09-09 NOTE — ED Triage Notes (Signed)
Advised to come in for a transfusion by PCP, PCP advised hgb is 6

## 2019-09-10 DIAGNOSIS — F339 Major depressive disorder, recurrent, unspecified: Secondary | ICD-10-CM

## 2019-09-10 DIAGNOSIS — E1159 Type 2 diabetes mellitus with other circulatory complications: Secondary | ICD-10-CM

## 2019-09-10 DIAGNOSIS — I63431 Cerebral infarction due to embolism of right posterior cerebral artery: Secondary | ICD-10-CM

## 2019-09-10 DIAGNOSIS — Z794 Long term (current) use of insulin: Secondary | ICD-10-CM

## 2019-09-10 DIAGNOSIS — N184 Chronic kidney disease, stage 4 (severe): Secondary | ICD-10-CM

## 2019-09-10 DIAGNOSIS — I10 Essential (primary) hypertension: Secondary | ICD-10-CM

## 2019-09-10 DIAGNOSIS — I5033 Acute on chronic diastolic (congestive) heart failure: Secondary | ICD-10-CM

## 2019-09-10 LAB — BASIC METABOLIC PANEL
Anion gap: 13 (ref 5–15)
BUN: 53 mg/dL — ABNORMAL HIGH (ref 8–23)
CO2: 35 mmol/L — ABNORMAL HIGH (ref 22–32)
Calcium: 9.8 mg/dL (ref 8.9–10.3)
Chloride: 88 mmol/L — ABNORMAL LOW (ref 98–111)
Creatinine, Ser: 2.94 mg/dL — ABNORMAL HIGH (ref 0.44–1.00)
GFR calc Af Amer: 16 mL/min — ABNORMAL LOW (ref >60)
GFR calc non Af Amer: 14 mL/min — ABNORMAL LOW (ref >60)
Glucose, Bld: 257 mg/dL — ABNORMAL HIGH (ref 70–99)
Potassium: 3.5 mmol/L (ref 3.5–5.1)
Sodium: 136 mmol/L (ref 135–145)

## 2019-09-10 LAB — CBC
HCT: 27.6 % — ABNORMAL LOW (ref 36.0–46.0)
Hemoglobin: 7.9 g/dL — ABNORMAL LOW (ref 12.0–15.0)
MCH: 24.8 pg — ABNORMAL LOW (ref 26.0–34.0)
MCHC: 28.6 g/dL — ABNORMAL LOW (ref 30.0–36.0)
MCV: 86.8 fL (ref 80.0–100.0)
Platelets: 415 10*3/uL — ABNORMAL HIGH (ref 150–400)
RBC: 3.18 MIL/uL — ABNORMAL LOW (ref 3.87–5.11)
RDW: 15.6 % — ABNORMAL HIGH (ref 11.5–15.5)
WBC: 8.5 10*3/uL (ref 4.0–10.5)
nRBC: 0 % (ref 0.0–0.2)

## 2019-09-10 LAB — GLUCOSE, CAPILLARY
Glucose-Capillary: 134 mg/dL — ABNORMAL HIGH (ref 70–99)
Glucose-Capillary: 244 mg/dL — ABNORMAL HIGH (ref 70–99)
Glucose-Capillary: 250 mg/dL — ABNORMAL HIGH (ref 70–99)
Glucose-Capillary: 268 mg/dL — ABNORMAL HIGH (ref 70–99)
Glucose-Capillary: 291 mg/dL — ABNORMAL HIGH (ref 70–99)

## 2019-09-10 MED ORDER — IPRATROPIUM-ALBUTEROL 0.5-2.5 (3) MG/3ML IN SOLN
3.0000 mL | Freq: Three times a day (TID) | RESPIRATORY_TRACT | Status: DC
  Administered 2019-09-10 – 2019-09-13 (×9): 3 mL via RESPIRATORY_TRACT
  Filled 2019-09-10 (×8): qty 3

## 2019-09-10 MED ORDER — ALBUTEROL SULFATE (2.5 MG/3ML) 0.083% IN NEBU: 2.5000 mg | INHALATION_SOLUTION | Freq: Four times a day (QID) | RESPIRATORY_TRACT | Status: DC | PRN

## 2019-09-10 NOTE — Progress Notes (Signed)
PROGRESS NOTE    Amy Wiggins  [name]:[id number] DOB: 01-Mar-1931 DOA: 09/09/2019   PCP: Asencion Noble, MD    Chief Complaint  Patient presents with  . Anemia    Brief Narrative:  As per H&P written by Dr. [name] on 09/09/19 Amy Wiggins is a 86 y.o. female with medical history significant for diastolic CHF, atrial fibrillation on chronic anticoagulation, CVA, diabetes mellitus, hypertension.  Primary care provider to come to the ED today after blood work drawn on Tuesday- 7/13, showed low hemoglobin.  Patient reports difficulty breathing, worse with activity, progressive lower extremity swelling with occasional weeping from legs, since she was discharged from nursing home just about 2 weeks ago.  She also reports abdominal bloating.  Daughter-in-law present at bedside reports increasing weight. No chest pain.  She denies vomiting of blood, no blood in stools, no black stools, no abdominal pain.  She is compliant with her Eliquis.  Due to lower extremity swelling, her dose of torsemide was increased, she is currently completing 1/3-day of 40 mg of torsemide twice daily.  Recent Hospitalization 5/29 to 08/03/2019-with hypoxia secondary to decompensated CHF, also pneumonia treated with cefepime.  Patient was diuresed, net -5.6 L on discharge.  Was discharged to nursing home.  Was subsequently recently discharged back home- ~~~ July 2nd.  On discharge from nursing home, patient was discharged with oxygen, daughter-in-law reports patient was on 4 L of oxygen.  ED Course: Sats 92% on 3 L nasal cannula, heart rate 79, respiratory 24, blood pressure 145/62.  WBC 7.5.  Hemoglobin 6.7.  Creatinine 2.95.  Sodium 130.  BNP elevated 306.  Portable chest x-ray showed moderate cardiogenic failure.  PRBC ordered for transfusion.  80 mg IV Lasix given x1.  Hospitalist to admit for acute on chronic anemia and decompensated CHF.  Assessment & Plan: 1-acute on chronic anemia -Patient without obvious GI  operative bleeding -Good response after 1 unit of PRBCs provided -Acute anemia appears to be anemia of chronic kidney disease -Safe to continue use of anticoagulation. -Outpatient Epogen/IV iron per nephrology discretion.  2-Acute on chronic diastolic (congestive) heart failure ([name]) -Recent echo June 2024; 6065% -Continue IV diuresis -continue daily weight and strict I's and O's  3-Chronic kidney disease a stage IV -Creatinine appears to be stable -Will continue following patient's daily weights/INO's -Close monitoring of renal function with active diuresis.  4-DM type 2 (diabetes mellitus, type 2) ([name]) with chronic kidney disease -Continue to follow CBGs -Continue sliding scale insulin -Adjust hypoglycemic regimen as required. -Recent A1c 8.4  5-Cerebrovascular accident (CVA) due to embolism of right posterior cerebral artery ([name]) -No new deficits appreciated -Continue risk factor modifications. -Patient chronically on Eliquis.  6-Essential hypertension -Overall stable and well-controlled -Continue current antihypertensive regimen. -Heart healthy diet has been encouraged.  7-atrial fibrillation -Rate control and is stable -Continue the use of Pacerone and antiacid -Continue Eliquis.  8-Major depression, recurrent, chronic (HCC) -No suicidal ideation hallucination -continue bupropion and celexa.  9-dyslipidemia -continue statins.  DVT prophylaxis: Eliquis Code Status: DNR Family Communication: No family at bedside. Disposition:   Status is: Inpatient  Dispo: The patient is from: home               Anticipated d/c is to: to be determine               Anticipated d/c date is: 1-2 days.              Patient currently no medically stable for discharge;  still complaining of shortness of breath and orthopnea.  Findings of fluid overload on physical examination; will continue IV diuresis.     Consultants:   none   Procedures: See below for x-ray reports.     Antimicrobials:  None    Subjective: Afebrile, no chest pain, no nausea, no vomiting.  Patient reports no shortness of breath with activity and some orthopnea.   Objective: Vitals:   09/10/19 0239 09/10/19 0352 09/10/19 0431 09/10/19 0741  BP:   (!) 118/47 (!) 105/54  Pulse:   74 80  Resp:   16 17  Temp:   97.9 F (36.6 C) (!) 97.5 F (36.4 C)  TempSrc:   Oral Oral  SpO2:   95% 92%  Weight:  87.9 kg    Height: 5\' 4"  (1.626 m)       Intake/Output Summary (Last 24 hours) at 09/10/2019 1636 Last data filed at 09/10/2019 1611 Gross per 24 hour  Intake --  Output 450 ml  Net -450 ml   Filed Weights   09/10/19 0000 09/10/19 0352  Weight: 87.9 kg 87.9 kg    Examination:  General exam: Appears calm and comfortable; feeling slightly more energetic and denying chest pain, nausea, vomiting or abdominal pain.  Patient is afebrile.  Reports Orthopnea and increase SOB on exertion.  Respiratory system: Fine crackles at the bases, no wheezing, no using accessory muscles. Cardiovascular system: Rate controlled, no rubs, no gallops, no JVD on exam.  1-2+ lower extremity edema appreciated on exam Gastrointestinal system: Abdomen is nondistended, soft and nontender. No organomegaly or masses felt. Normal bowel sounds heard. Central nervous system: Alert and oriented. No focal neurological deficits. Extremities: No cyanosis or clubbing. Skin: No petechiae. Psychiatry: Mood & affect appropriate.     Data Reviewed: I have personally reviewed following labs and imaging studies  CBC: Recent Labs  Lab 09/09/19 1916 09/10/19 0638  WBC 7.5 8.5  NEUTROABS 5.6  --   HGB 6.7* 7.9*  HCT 24.2* 27.6*  MCV 86.7 86.8  PLT 416* 415*    Basic Metabolic Panel: Recent Labs  Lab 09/09/19 1916 09/10/19 0638  NA 130* 136  K 3.9 3.5  CL 87* 88*  CO2 33* 35*  GLUCOSE 408* 257*  BUN 55* 53*  CREATININE 2.95* 2.94*  CALCIUM 9.2 9.8  MG 2.2  --     GFR: Estimated Creatinine  Clearance: 15 mL/min (A) (by C-G formula based on SCr of 2.94 mg/dL (H)).   CBG: Recent Labs  Lab 09/10/19 0027 09/10/19 0818 09/10/19 1149 09/10/19 1633  GLUCAP 291* 244* 268* 134*     Recent Results (from the past 240 hour(s))  SARS Coronavirus 2 by RT PCR (hospital order, performed in [facility] hospital lab) Nasopharyngeal Nasopharyngeal Swab     Status: None   Collection Time: 09/09/19  8:28 PM   Specimen: Nasopharyngeal Swab  Result Value Ref Range Status   SARS Coronavirus 2 NEGATIVE NEGATIVE Final    Comment: (NOTE) SARS-CoV-2 target nucleic acids are NOT DETECTED.  The SARS-CoV-2 RNA is generally detectable in upper and lower respiratory specimens during the acute phase of infection. The lowest concentration of SARS-CoV-2 viral copies this assay can detect is 250 copies / mL. A negative result does not preclude SARS-CoV-2 infection and should not be used as the sole basis for treatment or other patient management decisions.  A negative result may occur with improper specimen collection / handling, submission of specimen other than nasopharyngeal swab, presence of viral  mutation(s) within the areas targeted by this assay, and inadequate number of viral copies (<250 copies / mL). A negative result must be combined with clinical observations, patient history, and epidemiological information.  Fact Sheet for Patients:   StrictlyIdeas.no  Fact Sheet for Healthcare Providers: BankingDealers.co.za  This test is not yet approved or  cleared by the [place] FDA and has been authorized for detection and/or diagnosis of SARS-CoV-2 by FDA under an Emergency Use Authorization (EUA).  This EUA will remain in effect (meaning this test can be used) for the duration of the COVID-19 declaration under Section 564(b)(1) of the Act, 21 U.S.C. section 360bbb-3(b)(1), unless the authorization is terminated or revoked  sooner.  Performed at [facility], [address], [place], [place] [address]      Radiology Studies: [facility] 1 View  Result Date: 09/09/2019 CLINICAL DATA:  Dyspnea EXAM: PORTABLE CHEST 1 VIEW COMPARISON:  07/30/2019 FINDINGS: The lungs are symmetrically expanded. There has developed moderate interstitial pulmonary edema and small bilateral pleural effusions in keeping with changes of moderate cardiogenic failure. No pneumothorax. Cardiac size is mildly enlarged, unchanged. Implanted loop recorder device again noted overlying the inferior left hemithorax. No acute bone abnormality. IMPRESSION: Moderate cardiogenic failure. Electronically Signed   By: Fidela Salisbury MD   On: 09/09/2019 20:52    Scheduled Meds: . amiodarone  200 mg Oral Daily  . apixaban  2.5 mg Oral BID  . atorvastatin  40 mg Oral QPM  . buPROPion  300 mg Oral Daily  . citalopram  20 mg Oral Daily  . diltiazem  120 mg Oral Daily  . furosemide  60 mg Intravenous Q12H  . insulin aspart  0-15 Units Subcutaneous TID WC  . insulin aspart  0-5 Units Subcutaneous QHS  . insulin glargine  30 Units Subcutaneous QHS  . [START ON 09/11/2019] ipratropium-albuterol  3 mL Nebulization TID  . magnesium oxide  400 mg Oral QPM  . pantoprazole  40 mg Oral Daily  . potassium chloride SA  20 mEq Oral Daily  . traZODone  75 mg Oral QHS   Continuous Infusions: . sodium chloride       LOS: 1 day    Time spent: 30 minutes.   Barton Dubois, MD [facility]   To contact the attending provider between 7A-7P or the covering provider during after hours 7P-7A, please log into the web site [email or web address] and access using universal [place] password for that web site. If you do not have the password, please call the hospital operator.  09/10/2019, 4:36 PM

## 2019-09-10 NOTE — TOC Initial Note (Signed)
Transition of Care [facility]) - Initial/Assessment Note   Patient Details  Name: Amy Wiggins MRN: [id number] Date of Birth: February 13, 1932  Transition of Care [facility]) CM/SW Contact:    Sherie Don, LCSW Phone Number: 09/10/2019, 1:24 PM  Clinical Narrative: Patient is a 86 year old female who was admitted for acute on chronic anemia, CKD, DM type 2, cerebrovascular accident, hypertension, and acute on chronic diastolic CHF. Readmission prevention checklist completed due to high readmission score.  CSW called patient's granddaughter, [name], to complete assessment. Per granddaughter, patient has resumed living with the granddaughter after the patient discharged from [facility]. Granddaughter reported patient is currently active with [facility] for [facility]. Granddaughter reported patient requires extensive assistance with ADLs and has an in-home care agency, [facility], through her long-term care insurance. Granddaughter reported patient receives 10 hours/day 5 days/week, but the company is working on providing 24/7 care.  Granddaughter gave CSW verbal permission to follow up with [facility]. CSW called Georgina Snell with [facility]. Georgina Snell stated patient is currently active with RN, PT, and OT. Patient will need HH orders at discharge. TOC to follow.  Expected Discharge Plan: [place] Barriers to Discharge: Continued Medical Work up  Patient Goals and CMS Choice Patient states their goals for this hospitalization and ongoing recovery are:: Discharge home CMS Medicare.gov Compare Post Acute Care list provided to:: Patient Represenative (must comment) [name] (granddaughter)) Choice offered to / list presented to : Adult Children [name] Mee Hives (granddaughter))  Expected Discharge Plan and Services Expected Discharge Plan: [place] In-house Referral: Clinical Social Work Discharge Planning Services: [place] Post Acute Care Choice: [place] arrangements for the past 2 months: [place]: PT, OT, RN [place] Agency: [facility] (formerly [employer]) Date [place]: 09/10/19 Time [place]: 1307 Representative spoke with at [place]: Beryle Beams  Prior Living Arrangements/Services Living arrangements for the past 2 months: [place] Lives with:: Relatives Patient language and need for interpreter reviewed:: Yes Do you feel safe going back to the place where you live?: Yes      Need for Family Participation in Patient Care: No (Comment) Care giver support system in place?: Yes (comment) [name] (granddaughter)) Current home services: DME ([employer]) Criminal Activity/Legal Involvement Pertinent to Current Situation/Hospitalization: No - Comment as needed  Activities of Daily Living Home Assistive Devices/Equipment: Cane (specify quad or straight) (at times) ADL Screening (condition at time of admission) Patient's cognitive ability adequate to safely complete daily activities?: Yes Is the patient deaf or have difficulty hearing?: No Does the patient have difficulty seeing, even when wearing glasses/contacts?: No Does the patient have difficulty concentrating, remembering, or making decisions?: No Patient able to express need for assistance with ADLs?: Yes Does the patient have difficulty dressing or bathing?: No Independently performs ADLs?: Yes (appropriate for developmental age) Does the patient have difficulty walking or climbing stairs?: No Weakness of Legs: Both Weakness of Arms/Hands: None  Permission Sought/Granted Permission sought to share information with : Facility Art therapist granted to share info w AGENCY: [facility]  Emotional Assessment Appearance:: Appears stated age Attitude/Demeanor/Rapport: Unable to Assess Affect (typically observed): Unable to Assess Orientation: : Oriented to Self, Oriented to  Place, Oriented to Situation Alcohol / Substance Use: Not Applicable Psych Involvement: No (comment)  Admission diagnosis:  Acute on chronic diastolic congestive heart failure (HCC) [I50.33] Anemia, unspecified type [D64.9] Acute on chronic anemia [  D64.9] Patient Active Problem List   Diagnosis Date Noted   Acute on chronic anemia 09/09/2019   HCAP (healthcare-associated pneumonia) 08/26/2019   Hypertension associated with stage 4 chronic kidney disease due to type 2 diabetes mellitus ([place]) 08/04/2019   Hyperlipidemia associated with type 2 diabetes mellitus ([place]) 08/04/2019   CKD stage 4 due to type 2 diabetes mellitus ([place]) 08/04/2019   GERD without esophagitis 08/04/2019   Chronic constipation 08/04/2019   Major depression, recurrent, chronic ([place]) 08/04/2019   Hypomagnesemia 08/04/2019   Ground glass opacity present on imaging of lung 08/04/2019   Goals of care, counseling/discussion    Palliative care by specialist    Acute on chronic diastolic (congestive) heart failure ([place]) 07/24/2019   Acute on chronic respiratory failure with hypoxia ([place]) 09/29/2018   Atrial fibrillation with RVR ([place])    Chronic a-fib with RVR 09/28/2018   Cerebellar infarct ([place])    Stroke ([place]) 02/22/2016   HFpEF/Chronic diastolic congestive heart failure ([place]) 02/22/2016   Shortness of breath 01/14/2016   Cerebrovascular accident (CVA) due to embolism of right posterior cerebral artery ([place]) 08/08/2015   Essential hypertension 08/08/2015   HLD (hyperlipidemia) 08/08/2015   Type 2 diabetes mellitus with circulatory disorder ([place]) 08/08/2015   PFO (patent foramen ovale)    Hyperlipidemia 06/02/2015   CKD (chronic kidney disease) stage 4, GFR 15-29 ml/min ([place]) 06/02/2015   DM type 2 (diabetes mellitus, type 2) ([place]) 06/02/2015   Leukocytosis 06/02/2015   Muscle weakness (generalized) 09/16/2012   Pain in joint, shoulder region 09/16/2012   PCP:  Asencion Noble,  MD Pharmacy:   [place], [place] [address] [place] [address] Phone: [phone] Fax: [phone]  Readmission Risk Interventions Readmission Risk Prevention Plan 09/10/2019  Transportation Screening Complete  PCP or Specialist Appt within 3-5 Days Not Complete  Not Complete comments Granddaughter assists patient with scheduling appointments  [place] or Home Care Consult Complete  Social Work Consult for [place] Planning/Counseling Complete  Palliative Care Screening Not Applicable  Medication Review (RN Care Manager) Complete  Some recent data might be hidden

## 2019-09-10 NOTE — Progress Notes (Signed)
2230- pt and family member verbalized understanding for blood and agreed to transfusion, pt signed blood consent, however due to computer malfunction consent was not recorded.

## 2019-09-10 NOTE — Care Management Important Message (Signed)
Important Message  Patient Details  Name: Amy Wiggins MRN: [id number] Date of Birth: Feb 01, 1932   Medicare Important Message Given:  Yes     [name] 09/10/2019, 3:59 PM

## 2019-09-11 LAB — GLUCOSE, CAPILLARY
Glucose-Capillary: 149 mg/dL — ABNORMAL HIGH (ref 70–99)
Glucose-Capillary: 234 mg/dL — ABNORMAL HIGH (ref 70–99)
Glucose-Capillary: 293 mg/dL — ABNORMAL HIGH (ref 70–99)

## 2019-09-11 MED ORDER — METOLAZONE 5 MG PO TABS
2.5000 mg | ORAL_TABLET | Freq: Once | ORAL | Status: AC
  Administered 2019-09-11: 2.5 mg via ORAL
  Filled 2019-09-11: qty 1

## 2019-09-11 MED ORDER — FUROSEMIDE 10 MG/ML IJ SOLN
40.0000 mg | Freq: Two times a day (BID) | INTRAMUSCULAR | Status: DC
  Administered 2019-09-11 – 2019-09-13 (×4): 40 mg via INTRAVENOUS
  Filled 2019-09-11 (×4): qty 4

## 2019-09-11 NOTE — Progress Notes (Signed)
PROGRESS NOTE    Amy Wiggins  EHU:[id number] DOB: 1932-01-27 DOA: 09/09/2019   PCP: Asencion Noble, MD    Chief Complaint  Patient presents with  . Anemia    Brief Narrative:  As per H&P written by Dr. [name] on 09/09/19 Amy Wiggins is a 86 y.o. female with medical history significant for diastolic CHF, atrial fibrillation on chronic anticoagulation, CVA, diabetes mellitus, hypertension.  Primary care provider to come to the ED today after blood work drawn on Tuesday- 7/13, showed low hemoglobin.  Patient reports difficulty breathing, worse with activity, progressive lower extremity swelling with occasional weeping from legs, since she was discharged from nursing home just about 2 weeks ago.  She also reports abdominal bloating.  Daughter-in-law present at bedside reports increasing weight. No chest pain.  She denies vomiting of blood, no blood in stools, no black stools, no abdominal pain.  She is compliant with her Eliquis.  Due to lower extremity swelling, her dose of torsemide was increased, she is currently completing 1/3-day of 40 mg of torsemide twice daily.  Recent Hospitalization 5/29 to 08/03/2019-with hypoxia secondary to decompensated CHF, also pneumonia treated with cefepime.  Patient was diuresed, net -5.6 L on discharge.  Was discharged to nursing home.  Was subsequently recently discharged back home- ~~~ July 2nd.  On discharge from nursing home, patient was discharged with oxygen, daughter-in-law reports patient was on 4 L of oxygen.  ED Course: Sats 92% on 3 L nasal cannula, heart rate 79, respiratory 24, blood pressure 145/62.  WBC 7.5.  Hemoglobin 6.7.  Creatinine 2.95.  Sodium 130.  BNP elevated 306.  Portable chest x-ray showed moderate cardiogenic failure.  PRBC ordered for transfusion.  80 mg IV Lasix given x1.  Hospitalist to admit for acute on chronic anemia and decompensated CHF.  Assessment & Plan: 1-acute on chronic anemia -Patient without obvious GI  operative bleeding -Good response after 1 unit of PRBCs provided -Acute anemia appears to be anemia of chronic kidney disease -Safe to continue use of anticoagulation. -Outpatient Epogen/IV iron per nephrology discretion.  2-Acute on chronic diastolic (congestive) heart failure ([place]) -Recent echo June 2024; 6065% -Continue IV diuresis -continue daily weight and strict I's and O's  3-Chronic kidney disease a stage IV -Creatinine appears to be stable -Will continue following patient's daily weights/INO's -Close monitoring of renal function with active diuresis.  4-DM type 2 (diabetes mellitus, type 2) ([place]) with chronic kidney disease -Continue to follow CBGs -Continue sliding scale insulin -Adjust hypoglycemic regimen as required. -Recent A1c 8.4  5-Cerebrovascular accident (CVA) due to embolism of right posterior cerebral artery ([place]) -No new deficits appreciated -Continue risk factor modifications. -Patient chronically on Eliquis.  6-Essential hypertension -Overall stable and well-controlled -Continue current antihypertensive regimen. -Heart healthy diet has been encouraged.  7-atrial fibrillation -Rate control and is stable -Continue the use of Pacerone and antiacid -Continue Eliquis.  8-Major depression, recurrent, chronic (HCC) -No suicidal ideation hallucination -continue bupropion and celexa.  9-dyslipidemia -continue statins.  DVT prophylaxis: Eliquis Code Status: DNR Family Communication: No family at bedside. Disposition:   Status is: Inpatient  Dispo: The patient is from: home               Anticipated d/c is to: to be determine               Anticipated d/c date is: 1-2 days.              Patient currently no medically stable for discharge;  still complaining of shortness of breath and orthopnea.  Findings of fluid overload on physical examination; will continue IV diuresis.     Consultants:   none   Procedures: See below for x-ray reports.     Antimicrobials:  None    Subjective: No fever, no chest pain, no nausea or vomiting.  Continues to report some shortness of breath with activity and mild orthopnea.  Slowly improving and reporting good urine output.  Objective: Vitals:   09/11/19 0522 09/11/19 0753 09/11/19 1402 09/11/19 1454  BP: (!) 109/47  (!) 115/52   Pulse: 81  74   Resp: 19  18   Temp: 98.1 F (36.7 C)  98.3 F (36.8 C)   TempSrc: Oral  Oral   SpO2: 95% 93% 97% 99%  Weight:      Height:        Intake/Output Summary (Last 24 hours) at 09/11/2019 1647 Last data filed at 09/11/2019 1610 Gross per 24 hour  Intake 720 ml  Output 1600 ml  Net -880 ml   Filed Weights   09/10/19 0000 09/10/19 0352  Weight: 87.9 kg 87.9 kg    Examination:  General exam: Alert, awake, oriented x 3, complaining of orthopnea and shortness of breath on exertion; no chest pain, no nausea, no vomiting, no overt bleeding. Respiratory system: Fine crackles at the bases, no wheezing, no using accessory muscles. Cardiovascular system: Positive systolic murmur, no rubs, no gallops, no JVD on exam.  RRR. Gastrointestinal system: Abdomen is nondistended, soft and nontender. No organomegaly or masses felt. Normal bowel sounds heard. Central nervous system: Alert and oriented. No focal neurological deficits. Extremities: No cyanosis or clubbing; 1+ edema appreciated bilaterally. Skin: No petechiae. Psychiatry: Mood & affect appropriate.     Data Reviewed: I have personally reviewed following labs and imaging studies  CBC: Recent Labs  Lab 09/09/19 1916 09/10/19 0638  WBC 7.5 8.5  NEUTROABS 5.6  --   HGB 6.7* 7.9*  HCT 24.2* 27.6*  MCV 86.7 86.8  PLT 416* 415*    Basic Metabolic Panel: Recent Labs  Lab 09/09/19 1916 09/10/19 0638  NA 130* 136  K 3.9 3.5  CL 87* 88*  CO2 33* 35*  GLUCOSE 408* 257*  BUN 55* 53*  CREATININE 2.95* 2.94*  CALCIUM 9.2 9.8  MG 2.2  --     GFR: Estimated Creatinine Clearance:  15 mL/min (A) (by C-G formula based on SCr of 2.94 mg/dL (H)).   CBG: Recent Labs  Lab 09/10/19 1149 09/10/19 1633 09/10/19 2123 09/11/19 0725 09/11/19 1118  GLUCAP 268* 134* 250* 149* 234*     Recent Results (from the past 240 hour(s))  SARS Coronavirus 2 by RT PCR (hospital order, performed in [facility] hospital lab) Nasopharyngeal Nasopharyngeal Swab     Status: None   Collection Time: 09/09/19  8:28 PM   Specimen: Nasopharyngeal Swab  Result Value Ref Range Status   SARS Coronavirus 2 NEGATIVE NEGATIVE Final    Comment: (NOTE) SARS-CoV-2 target nucleic acids are NOT DETECTED.  The SARS-CoV-2 RNA is generally detectable in upper and lower respiratory specimens during the acute phase of infection. The lowest concentration of SARS-CoV-2 viral copies this assay can detect is 250 copies / mL. A negative result does not preclude SARS-CoV-2 infection and should not be used as the sole basis for treatment or other patient management decisions.  A negative result may occur with improper specimen collection / handling, submission of specimen other than nasopharyngeal swab, presence of  viral mutation(s) within the areas targeted by this assay, and inadequate number of viral copies (<250 copies / mL). A negative result must be combined with clinical observations, patient history, and epidemiological information.  Fact Sheet for Patients:   StrictlyIdeas.no  Fact Sheet for Healthcare Providers: BankingDealers.co.za  This test is not yet approved or  cleared by the [place] FDA and has been authorized for detection and/or diagnosis of SARS-CoV-2 by FDA under an Emergency Use Authorization (EUA).  This EUA will remain in effect (meaning this test can be used) for the duration of the COVID-19 declaration under Section 564(b)(1) of the Act, 21 U.S.C. section 360bbb-3(b)(1), unless the authorization is terminated or revoked  sooner.  Performed at [facility], [address], [place], [place] [address]      Radiology Studies: [facility] Chest [facility] 1 View  Result Date: 09/09/2019 CLINICAL DATA:  Dyspnea EXAM: PORTABLE CHEST 1 VIEW COMPARISON:  07/30/2019 FINDINGS: The lungs are symmetrically expanded. There has developed moderate interstitial pulmonary edema and small bilateral pleural effusions in keeping with changes of moderate cardiogenic failure. No pneumothorax. Cardiac size is mildly enlarged, unchanged. Implanted loop recorder device again noted overlying the inferior left hemithorax. No acute bone abnormality. IMPRESSION: Moderate cardiogenic failure. Electronically Signed   By: Fidela Salisbury MD   On: 09/09/2019 20:52    Scheduled Meds: . amiodarone  200 mg Oral Daily  . apixaban  2.5 mg Oral BID  . atorvastatin  40 mg Oral QPM  . buPROPion  300 mg Oral Daily  . citalopram  20 mg Oral Daily  . diltiazem  120 mg Oral Daily  . furosemide  60 mg Intravenous Q12H  . insulin aspart  0-15 Units Subcutaneous TID WC  . insulin aspart  0-5 Units Subcutaneous QHS  . insulin glargine  30 Units Subcutaneous QHS  . ipratropium-albuterol  3 mL Nebulization TID  . magnesium oxide  400 mg Oral QPM  . pantoprazole  40 mg Oral Daily  . potassium chloride SA  20 mEq Oral Daily  . traZODone  75 mg Oral QHS   Continuous Infusions: . sodium chloride       LOS: 2 days    Time spent: 30 minutes.   Barton Dubois, MD [facility]   To contact the attending provider between 7A-7P or the covering provider during after hours 7P-7A, please log into the web site [email or web address] and access using universal [place] password for that web site. If you do not have the password, please call the hospital operator.  09/11/2019, 4:47 PM

## 2019-09-12 LAB — BASIC METABOLIC PANEL
Anion gap: 11 (ref 5–15)
BUN: 47 mg/dL — ABNORMAL HIGH (ref 8–23)
CO2: 37 mmol/L — ABNORMAL HIGH (ref 22–32)
Calcium: 9.5 mg/dL (ref 8.9–10.3)
Chloride: 88 mmol/L — ABNORMAL LOW (ref 98–111)
Creatinine, Ser: 3.05 mg/dL — ABNORMAL HIGH (ref 0.44–1.00)
GFR calc Af Amer: 15 mL/min — ABNORMAL LOW (ref >60)
GFR calc non Af Amer: 13 mL/min — ABNORMAL LOW (ref >60)
Glucose, Bld: 251 mg/dL — ABNORMAL HIGH (ref 70–99)
Potassium: 3.6 mmol/L (ref 3.5–5.1)
Sodium: 136 mmol/L (ref 135–145)

## 2019-09-12 LAB — CBC
HCT: 28.5 % — ABNORMAL LOW (ref 36.0–46.0)
Hemoglobin: 8.1 g/dL — ABNORMAL LOW (ref 12.0–15.0)
MCH: 24.5 pg — ABNORMAL LOW (ref 26.0–34.0)
MCHC: 28.4 g/dL — ABNORMAL LOW (ref 30.0–36.0)
MCV: 86.4 fL (ref 80.0–100.0)
Platelets: 404 10*3/uL — ABNORMAL HIGH (ref 150–400)
RBC: 3.3 MIL/uL — ABNORMAL LOW (ref 3.87–5.11)
RDW: 15.8 % — ABNORMAL HIGH (ref 11.5–15.5)
WBC: 7.9 10*3/uL (ref 4.0–10.5)
nRBC: 0 % (ref 0.0–0.2)

## 2019-09-12 LAB — GLUCOSE, CAPILLARY
Glucose-Capillary: 199 mg/dL — ABNORMAL HIGH (ref 70–99)
Glucose-Capillary: 306 mg/dL — ABNORMAL HIGH (ref 70–99)
Glucose-Capillary: 242 mg/dL — ABNORMAL HIGH (ref 70–99)
Glucose-Capillary: 340 mg/dL — ABNORMAL HIGH (ref 70–99)

## 2019-09-12 NOTE — Progress Notes (Signed)
PROGRESS NOTE    CARON ODE  EPP:[id number] DOB: May 15, 1931 DOA: 09/09/2019   PCP: Asencion Noble, MD    Chief Complaint  Patient presents with  . Anemia    Brief Narrative:  As per H&P written by Dr. [name] on 09/09/19 Amy Wiggins is a 86 y.o. female with medical history significant for diastolic CHF, atrial fibrillation on chronic anticoagulation, CVA, diabetes mellitus, hypertension.  Primary care provider to come to the ED today after blood work drawn on Tuesday- 7/13, showed low hemoglobin.  Patient reports difficulty breathing, worse with activity, progressive lower extremity swelling with occasional weeping from legs, since she was discharged from nursing home just about 2 weeks ago.  She also reports abdominal bloating.  Daughter-in-law present at bedside reports increasing weight. No chest pain.  She denies vomiting of blood, no blood in stools, no black stools, no abdominal pain.  She is compliant with her Eliquis.  Due to lower extremity swelling, her dose of torsemide was increased, she is currently completing 1/3-day of 40 mg of torsemide twice daily.  Recent Hospitalization 5/29 to 08/03/2019-with hypoxia secondary to decompensated CHF, also pneumonia treated with cefepime.  Patient was diuresed, net -5.6 L on discharge.  Was discharged to nursing home.  Was subsequently recently discharged back home- ~~~ July 2nd.  On discharge from nursing home, patient was discharged with oxygen, daughter-in-law reports patient was on 4 L of oxygen.  ED Course: Sats 92% on 3 L nasal cannula, heart rate 79, respiratory 24, blood pressure 145/62.  WBC 7.5.  Hemoglobin 6.7.  Creatinine 2.95.  Sodium 130.  BNP elevated 306.  Portable chest x-ray showed moderate cardiogenic failure.  PRBC ordered for transfusion.  80 mg IV Lasix given x1.  Hospitalist to admit for acute on chronic anemia and decompensated CHF.  Assessment & Plan: 1-acute on chronic anemia -Patient without obvious GI  operative bleeding -Good response after 1 unit of PRBCs provided; Hgb has remained stable and currently 8.1 -per anemia panel appears to be anemia of chronic kidney disease -Safe to continue use of anticoagulation. -Outpatient Epogen/IV iron per nephrology discretion.  2-Acute on chronic diastolic (congestive) heart failure ([place]) -Recent echo June 2024; 60-65% -Continue IV diuresis for another 24 hours; -continue to follow daily weight and strict I's and O's  3-Chronic kidney disease a stage IV -Creatinine appears to be stable -Will continue following patient's daily weights/INO's -Close monitoring of renal function with active diuresis.  4-DM type 2 (diabetes mellitus, type 2) ([place]) with chronic kidney disease -Continue to follow CBGs -Continue sliding scale insulin -Adjust hypoglycemic regimen as required. -Recent A1c 8.4  5-Cerebrovascular accident (CVA) due to embolism of right posterior cerebral artery ([place]) -No new deficits appreciated -Continue risk factor modifications. -Patient chronically on Eliquis.  6-Essential hypertension -Overall stable and well-controlled -Continue current antihypertensive regimen. -Heart healthy diet has been encouraged.  7-atrial fibrillation -Rate control and is stable -Continue the use of Pacerone and antiacid -Continue Eliquis.  8-Major depression, recurrent, chronic ([facility]) -No suicidal ideation hallucination -continue bupropion and celexa.  9-dyslipidemia -continue statins.  DVT prophylaxis: Eliquis Code Status: DNR Family Communication: No family at bedside. Disposition:   Status is: Inpatient  Dispo: The patient is from: home               Anticipated d/c is to: to be determine               Anticipated d/c date is: 1 day.  Patient currently no medically stable for discharge; still complaining of shortness of breath and orthopnea.  Findings of fluid overload on physical examination; will continue IV  diuresis.     Consultants:   none   Procedures: See below for x-ray reports.    Antimicrobials:  None    Subjective: No fever, no chest pain, no nausea or vomiting.  Continues to report some shortness of breath with activity and mild orthopnea.  Slowly improving and reporting good urine output.  Objective: Vitals:   09/11/19 2259 09/12/19 0346 09/12/19 0620 09/12/19 0758  BP: (!) 99/46  (!) 102/42   Pulse: 80  87   Resp: 20  20   Temp: 98.4 F (36.9 C)  98.7 F (37.1 C)   TempSrc: Oral  Oral   SpO2: 99%  98% 95%  Weight:  88.4 kg    Height:        Intake/Output Summary (Last 24 hours) at 09/12/2019 1124 Last data filed at 09/11/2019 1811 Gross per 24 hour  Intake 960 ml  Output 900 ml  Net 60 ml   Filed Weights   09/10/19 0000 09/10/19 0352 09/12/19 0346  Weight: 87.9 kg 87.9 kg 88.4 kg    Examination: General exam: Alert, awake, oriented x 3, reports improvement in her breathing; still having some shortness of breath on exertion.  Oxygen supplementation back to her baseline.  Reports mild orthopnea.  Good urine output overnight. Respiratory system: No wheezing, no using accessory muscles; decreased breath sounds at the bases.  No frank crackles. Cardiovascular system:Rate controlled, no rubs, no gallops, no JVD appreciated on exam. Gastrointestinal system: Abdomen is nondistended, soft and nontender. No organomegaly or masses felt. Normal bowel sounds heard. Central nervous system: Alert and oriented. No focal neurological deficits. Extremities: No cyanosis or clubbing; chronic stasis dermatitis appreciated on her legs bilaterally.  1+ edema. Skin: No petechiae Psychiatry: Judgement and insight appear normal. Mood & affect appropriate.    Data Reviewed: I have personally reviewed following labs and imaging studies  CBC: Recent Labs  Lab 09/09/19 1916 09/10/19 0638 09/12/19 0809  WBC 7.5 8.5 7.9  NEUTROABS 5.6  --   --   HGB 6.7* 7.9* 8.1*  HCT 24.2*  27.6* 28.5*  MCV 86.7 86.8 86.4  PLT 416* 415* 404*    Basic Metabolic Panel: Recent Labs  Lab 09/09/19 1916 09/10/19 0638 09/12/19 0809  NA 130* 136 136  K 3.9 3.5 3.6  CL 87* 88* 88*  CO2 33* 35* 37*  GLUCOSE 408* 257* 251*  BUN 55* 53* 47*  CREATININE 2.95* 2.94* 3.05*  CALCIUM 9.2 9.8 9.5  MG 2.2  --   --     GFR: Estimated Creatinine Clearance: 14.5 mL/min (A) (by C-G formula based on SCr of 3.05 mg/dL (H)).   CBG: Recent Labs  Lab 09/11/19 0725 09/11/19 1118 09/11/19 2112 09/12/19 0724 09/12/19 1112  GLUCAP 149* 234* 293* 199* 306*     Recent Results (from the past 240 hour(s))  SARS Coronavirus 2 by RT PCR (hospital order, performed in [facility] hospital lab) Nasopharyngeal Nasopharyngeal Swab     Status: None   Collection Time: 09/09/19  8:28 PM   Specimen: Nasopharyngeal Swab  Result Value Ref Range Status   SARS Coronavirus 2 NEGATIVE NEGATIVE Final    Comment: (NOTE) SARS-CoV-2 target nucleic acids are NOT DETECTED.  The SARS-CoV-2 RNA is generally detectable in upper and lower respiratory specimens during the acute phase of infection. The lowest concentration of  SARS-CoV-2 viral copies this assay can detect is 250 copies / mL. A negative result does not preclude SARS-CoV-2 infection and should not be used as the sole basis for treatment or other patient management decisions.  A negative result may occur with improper specimen collection / handling, submission of specimen other than nasopharyngeal swab, presence of viral mutation(s) within the areas targeted by this assay, and inadequate number of viral copies (<250 copies / mL). A negative result must be combined with clinical observations, patient history, and epidemiological information.  Fact Sheet for Patients:   StrictlyIdeas.no  Fact Sheet for Healthcare Providers: BankingDealers.co.za  This test is not yet approved or  cleared by the  [place] FDA and has been authorized for detection and/or diagnosis of SARS-CoV-2 by FDA under an Emergency Use Authorization (EUA).  This EUA will remain in effect (meaning this test can be used) for the duration of the COVID-19 declaration under Section 564(b)(1) of the Act, 21 U.S.C. section 360bbb-3(b)(1), unless the authorization is terminated or revoked sooner.  Performed at [facility], [address], [place], [place] [address]      Radiology Studies: No results found.  Scheduled Meds: . amiodarone  200 mg Oral Daily  . apixaban  2.5 mg Oral BID  . atorvastatin  40 mg Oral QPM  . buPROPion  300 mg Oral Daily  . citalopram  20 mg Oral Daily  . diltiazem  120 mg Oral Daily  . furosemide  40 mg Intravenous Q12H  . insulin aspart  0-15 Units Subcutaneous TID WC  . insulin aspart  0-5 Units Subcutaneous QHS  . insulin glargine  30 Units Subcutaneous QHS  . ipratropium-albuterol  3 mL Nebulization TID  . magnesium oxide  400 mg Oral QPM  . pantoprazole  40 mg Oral Daily  . potassium chloride SA  20 mEq Oral Daily  . traZODone  75 mg Oral QHS   Continuous Infusions: . sodium chloride       LOS: 3 days    Time spent: 30 minutes.   Barton Dubois, MD [facility]   To contact the attending provider between 7A-7P or the covering provider during after hours 7P-7A, please log into the web site [email or web address] and access using universal [place] password for that web site. If you do not have the password, please call the hospital operator.  09/12/2019, 11:24 AM

## 2019-09-13 DIAGNOSIS — I482 Chronic atrial fibrillation, unspecified: Secondary | ICD-10-CM

## 2019-09-13 DIAGNOSIS — K219 Gastro-esophageal reflux disease without esophagitis: Secondary | ICD-10-CM

## 2019-09-13 LAB — BPAM RBC
Blood Product Expiration Date: 202108172359
Blood Product Expiration Date: 202108182359
ISSUE DATE / TIME: 202107152233
Unit Type and Rh: 5100
Unit Type and Rh: 5100

## 2019-09-13 LAB — TYPE AND SCREEN
ABO/RH(D): O POS
Antibody Screen: NEGATIVE
Unit division: 0
Unit division: 0

## 2019-09-13 LAB — BASIC METABOLIC PANEL
Anion gap: 12 (ref 5–15)
BUN: 48 mg/dL — ABNORMAL HIGH (ref 8–23)
CO2: 38 mmol/L — ABNORMAL HIGH (ref 22–32)
Calcium: 9.4 mg/dL (ref 8.9–10.3)
Chloride: 88 mmol/L — ABNORMAL LOW (ref 98–111)
Creatinine, Ser: 3.22 mg/dL — ABNORMAL HIGH (ref 0.44–1.00)
GFR calc Af Amer: 14 mL/min — ABNORMAL LOW (ref >60)
GFR calc non Af Amer: 12 mL/min — ABNORMAL LOW (ref >60)
Glucose, Bld: 114 mg/dL — ABNORMAL HIGH (ref 70–99)
Potassium: 3.4 mmol/L — ABNORMAL LOW (ref 3.5–5.1)
Sodium: 138 mmol/L (ref 135–145)

## 2019-09-13 LAB — GLUCOSE, CAPILLARY
Glucose-Capillary: 199 mg/dL — ABNORMAL HIGH (ref 70–99)
Glucose-Capillary: 116 mg/dL — ABNORMAL HIGH (ref 70–99)
Glucose-Capillary: 231 mg/dL — ABNORMAL HIGH (ref 70–99)

## 2019-09-13 MED ORDER — TORSEMIDE 20 MG PO TABS: ORAL_TABLET | ORAL | 1 refills | Status: DC

## 2019-09-13 NOTE — Progress Notes (Signed)
Discharge instructions reviewed with patient. Given AVS. Prescription sent to pharmacy per MD, patient verbalized understanding of instructions and to pick up prescription. States she will schedule follow-up with PCP within this week. IV site removed, site within normal limits. No complaints at time of discharge. Patient in stable condition awaiting family arrival to take her home.

## 2019-09-13 NOTE — Plan of Care (Signed)
  Problem: Acute Rehab PT Goals(only PT should resolve) Goal: Patient Will Transfer Sit To/From Stand Outcome: Progressing Flowsheets (Taken 09/13/2019 1217) Patient will transfer sit to/from stand: with modified independence Goal: Pt Will Transfer Bed To Chair/Chair To Bed Outcome: Progressing Flowsheets (Taken 09/13/2019 1217) Pt will Transfer Bed to Chair/Chair to Bed: with modified independence Goal: Pt Will Ambulate Outcome: Progressing Flowsheets (Taken 09/13/2019 1217) Pt will Ambulate:  50 feet  with modified independence  with supervision  with rolling walker   12:18 PM, 09/13/19 Lonell Grandchild, MPT Physical Therapist with [facility] [phone] office [phone] mobile phone

## 2019-09-13 NOTE — Evaluation (Signed)
Physical Therapy Evaluation Patient Details Name: Amy Wiggins MRN: [id number] DOB: December 28, 1931 Today's Date: 09/13/2019   History of Present Illness  Amy Wiggins is a 86 y.o. female with medical history significant for diastolic CHF, atrial fibrillation on chronic anticoagulation, CVA, diabetes mellitus, hypertension. Primary care provider to come to the ED today after blood work drawn on Tuesday- 7/13, showed low hemoglobin.  Patient reports difficulty breathing, worse with activity, progressive lower extremity swelling with occasional weeping from legs, since she was discharged from nursing home just about 2 weeks ago.  She also reports abdominal bloating.  Daughter-in-law present at bedside reports increasing weight. No chest pain.  She denies vomiting of blood, no blood in stools, no black stools, no abdominal pain.  She is compliant with her Eliquis.  Due to lower extremity swelling, her dose of torsemide was increased, she is currently completing 1/3-day of 40 mg of torsemide twice daily.    Clinical Impression  Patient functioning near baseline for functional mobility and gait, has to lean on nearby objects for support when not using RW, SpO2 dropped from 94% to 81% on room air while seated at bedside and required 2 LPM O2 for ambulation.  Patient limited for ambulation mostly due to fatigue with SpO2 at 94% while on 2 LPM and tolerated sitting up in chair after therapy.  Patient will benefit from continued physical therapy in hospital and recommended venue below to increase strength, balance, endurance for safe ADLs and gait.     Follow Up Recommendations Home health PT;Supervision for mobility/OOB;Supervision - Intermittent    Equipment Recommendations  None recommended by PT    Recommendations for Other Services       Precautions / Restrictions Precautions Precautions: Fall Restrictions Weight Bearing Restrictions: No      Mobility  Bed Mobility Overal bed  mobility: Modified Independent             General bed mobility comments: head of bed raised  Transfers Overall transfer level: Needs assistance Equipment used: Rolling walker (2 wheeled) Transfers: Sit to/from [facility] Sit to Stand: Supervision Stand pivot transfers: Supervision       General transfer comment: increased time, has to lean on nearby objects for support if not using RW  Ambulation/Gait Ambulation/Gait assistance: Supervision Gait Distance (Feet): 40 Feet Assistive device: Rolling walker (2 wheeled) Gait Pattern/deviations: Decreased step length - right;Decreased step length - left;Decreased stride length Gait velocity: decreased   General Gait Details: slow labored cadence without loss of balance, limited secondary to fatigue, on 2 LPM with SpO2 at 93%  Stairs            Wheelchair Mobility    Modified Rankin (Stroke Patients Only)       Balance Overall balance assessment: Needs assistance Sitting-balance support: Feet supported;No upper extremity supported Sitting balance-Leahy Scale: Good Sitting balance - Comments: seated at EOB   Standing balance support: During functional activity;Bilateral upper extremity supported Standing balance-Leahy Scale: Fair Standing balance comment: using RW                             Pertinent Vitals/Pain Pain Assessment: No/denies pain    Home Living Family/patient expects to be discharged to:: Private residence Living Arrangements: Other relatives Available Help at Discharge: Family;Available PRN/intermittently Type of Home: House Home Access: Ramped entrance     Home Layout: Two level;Able to live on main level with bedroom/bathroom Home Equipment: Kasandra Knudsen -  single point;Walker - 4 wheels;Shower seat - built in      Prior Function Level of Independence: Needs assistance   Gait / Transfers Assistance Needed: household ambulator with RW  ADL's / Homemaking Assistance  Needed: assisted by family        Hand Dominance   Dominant Hand: Right    Extremity/Trunk Assessment   Upper Extremity Assessment Upper Extremity Assessment: Overall WFL for tasks assessed    Lower Extremity Assessment Lower Extremity Assessment: Generalized weakness    Cervical / Trunk Assessment Cervical / Trunk Assessment: Normal  Communication   Communication: No difficulties  Cognition Arousal/Alertness: Awake/alert Behavior During Therapy: WFL for tasks assessed/performed Overall Cognitive Status: Within Functional Limits for tasks assessed                                        General Comments      Exercises     Assessment/Plan    PT Assessment Patient needs continued PT services  PT Problem List Decreased strength;Decreased activity tolerance;Decreased balance;Decreased mobility       PT Treatment Interventions Balance training;Stair training;Functional mobility training;Therapeutic activities;Therapeutic exercise;Patient/family education    PT Goals (Current goals can be found in the Care Plan section)  Acute Rehab PT Goals Patient Stated Goal: return home with family to assist PT Goal Formulation: With patient Time For Goal Achievement: 09/16/19 Potential to Achieve Goals: Good    Frequency Min 3X/week   Barriers to discharge        Co-evaluation               AM-PAC PT "6 Clicks" Mobility  Outcome Measure Help needed turning from your back to your side while in a flat bed without using bedrails?: None Help needed moving from lying on your back to sitting on the side of a flat bed without using bedrails?: None Help needed moving to and from a bed to a chair (including a wheelchair)?: A Little Help needed standing up from a chair using your arms (e.g., wheelchair or bedside chair)?: A Little Help needed to walk in hospital room?: A Little Help needed climbing 3-5 steps with a railing? : A Little 6 Click Score: 20     End of Session Equipment Utilized During Treatment: Oxygen Activity Tolerance: Patient tolerated treatment well;Patient limited by fatigue Patient left: in chair;with call bell/phone within reach Nurse Communication: Mobility status PT Visit Diagnosis: Unsteadiness on feet (R26.81);Other abnormalities of gait and mobility (R26.89);Muscle weakness (generalized) (M62.81)    Time: 9702-6378 PT Time Calculation (min) (ACUTE ONLY): 29 min   Charges:   PT Evaluation $PT Eval Moderate Complexity: 1 Mod PT Treatments $Therapeutic Activity: 23-37 mins        12:14 PM, 09/13/19 Lonell Grandchild, MPT Physical Therapist with [facility] [phone] office [phone] mobile phone

## 2019-09-13 NOTE — Progress Notes (Signed)
Nsg Discharge Note  Admit Date:  09/09/2019 Discharge date: 09/13/2019   Amy Wiggins to be D/C'd Home per MD order.  AVS completed.  Copy for chart, and copy for patient signed, and dated. IV removed. Paperwork reviewed with niece. Answered all questions. Wheeled stable patient and belongings to main entrance where she was picked up by her niece to d/c to home. Patient/caregiver able to verbalize understanding.  Discharge Medication: Allergies as of 09/13/2019   No Known Allergies     Medication List    TAKE these medications   acetaminophen 500 MG tablet Commonly known as: TYLENOL Take 1,000 mg by mouth at bedtime. may also take 2 tablets during the day as needed for pain   amiodarone 200 MG tablet Commonly known as: PACERONE Take 1 tablet (200 mg total) by mouth daily.   atorvastatin 20 MG tablet Commonly known as: LIPITOR Take 2 tablets (40 mg total) by mouth every evening. 4pm   buPROPion 300 MG 24 hr tablet Commonly known as: WELLBUTRIN XL Take 1 tablet (300 mg total) by mouth daily.   citalopram 20 MG tablet Commonly known as: CELEXA Take 1 tablet (20 mg total) by mouth daily.   diltiazem 120 MG 24 hr capsule Commonly known as: CARDIZEM CD Take 1 capsule (120 mg total) by mouth daily.   docusate sodium 100 MG capsule Commonly known as: COLACE Take 100 mg by mouth daily.   Eliquis 2.5 MG Tabs tablet Generic drug: apixaban Take 1 tablet (2.5 mg total) by mouth 2 (two) times daily.   insulin glargine 100 UNIT/ML injection Commonly known as: Lantus Inject 0.3 mLs (30 Units total) into the skin at bedtime.   ipratropium-albuterol 0.5-2.5 (3) MG/3ML Soln Commonly known as: DUONEB Take 3 mLs by nebulization every 6 (six) hours.   Magnesium Oxide 250 MG Tabs Take 2 tablets (500 mg total) by mouth every evening.   multivitamin with minerals Tabs tablet Take 1 tablet by mouth at bedtime. For supplement   NovoLOG FlexPen 100 UNIT/ML FlexPen Generic drug:  insulin aspart Inject 10 Units into the skin 3 (three) times daily with meals. insulin pen; 100 unit/mL (3 mL); amt: 10 units; subcutaneous Special Instructions: admin 5 units for CBG >150 Before Meals 08:00 AM, 11:30 AM, 05:00 PM   OXYGEN Inhale 2 L into the lungs continuous.   pantoprazole 40 MG tablet Commonly known as: PROTONIX Take 1 tablet (40 mg total) by mouth daily. For acid reflux/gerd   potassium chloride SA 20 MEQ tablet Commonly known as: KLOR-CON Take 1 tablet (20 mEq total) by mouth daily.   PreserVision AREDS 2 Caps Take 1 capsule by mouth 2 (two) times daily.   torsemide 20 MG tablet Commonly known as: DEMADEX Take 40mg  in am and 20mg  in the evening. What changed:   how much to take  how to take this  when to take this  additional instructions   traZODone 150 MG tablet Commonly known as: DESYREL Take 0.5 tablets (75 mg total) by mouth at bedtime. 0.5 tablet to = 75 mg       Discharge Assessment: Vitals:   09/13/19 0826 09/13/19 1441  BP:    Pulse:    Resp:    Temp:    SpO2: 94% 95%   Skin clean, dry and intact without evidence of skin break down, no evidence of skin tears noted. IV catheter discontinued intact. Site without signs and symptoms of complications - no redness or edema noted at insertion site, patient  denies c/o pain - only slight tenderness at site.  Dressing with slight pressure applied.  D/c Instructions-Education: Discharge instructions given to patient/family with verbalized understanding. D/c education completed with patient/family including follow up instructions, medication list, d/c activities limitations if indicated, with other d/c instructions as indicated by MD - patient able to verbalize understanding, all questions fully answered. Patient instructed to return to ED, call 911, or call MD for any changes in condition.  Patient escorted via [place], and D/C home via private auto.  [name], RN 09/13/2019 5:01 PM

## 2019-09-13 NOTE — Discharge Summary (Signed)
Physician Discharge Summary  Amy Wiggins WFU:[id number] DOB: Jan 03, 1932 DOA: 09/09/2019  PCP: Asencion Noble, MD  Admit date: 09/09/2019 Discharge date: 09/13/2019  Time spent: 35 minutes  Recommendations for Outpatient Follow-up:  Repeat basic metabolic panel to follow across renal function Outpatient follow-up with nephrology service strongly encouraged.  Discharge Diagnoses:  Principal Problem:   Acute on chronic anemia Active Problems:   CKD (chronic kidney disease) stage 4, GFR 15-29 ml/min (HCC)   DM type 2 (diabetes mellitus, type 2) ([name])   Cerebrovascular accident (CVA) due to embolism of right posterior cerebral artery ([name])   Essential hypertension   Type 2 diabetes mellitus with circulatory disorder (HCC)   Atrial fibrillation, chronic (HCC)   Acute on chronic diastolic (congestive) heart failure (HCC)   Gastroesophageal reflux disease   Major depression, recurrent, chronic ([place])   Discharge Condition: Stable and improved.  Patient discharged home with home health services and instruction to follow-up with PCP in 10 days.  CODE STATUS: DNR  Diet recommendation: Heart healthy/modified carbohydrate diet.  Filed Weights   09/10/19 0000 09/10/19 0352 09/12/19 0346  Weight: 87.9 kg 87.9 kg 88.4 kg    History of present illness:  As per H&P written by Dr. [name] on 09/09/19 Amy Bring Pruittis a 86 y.o.femalewith medical history significant fordiastolic CHF, atrial fibrillation on chronic anticoagulation, CVA, diabetes mellitus, hypertension. Primary care provider to come to the ED today after blood work drawn on Tuesday- 7/13,showed low hemoglobin. Patient reports difficulty breathing, worse with activity, progressive lower extremity swelling with occasional weeping from legs, since she was discharged from nursing home just about 2 weeks ago. She also reports abdominal bloating. Daughter-in-law present at bedside reports increasing weight. No chest pain.She  denies vomiting of blood, no blood in stools, no black stools, no abdominal pain. She is compliant with her Eliquis. Due to lower extremity swelling, her dose of torsemide was increased, she is currently completing 1/3-day of 40 mg of torsemide twice daily.  RecentHospitalization 5/29 to 08/03/2019-with hypoxia secondary to decompensated CHF, also pneumonia treated with cefepime. Patient was diuresed,net -5.6L on discharge. Was discharged to nursing home.Was subsequently recently discharged backhome- ~~~July 2nd.  On discharge from nursing home, patient was discharged with oxygen, daughter-in-law reports patient was on 4 L of oxygen.  ED Course:Sats 92% on 3 L nasal cannula, heart rate 79, respiratory 24, blood pressure 145/62. WBC 7.5. Hemoglobin 6.7. Creatinine 2.95. Sodium 130. BNP elevated 306. Portable chest x-ray showed moderate cardiogenic failure. PRBC ordered for transfusion.80 mg IV Lasix given x1. Hospitalist to admit for acute on chronic anemia and decompensated CHF.  Hospital Course:  1-acute on chronic anemia -Patient without obvious GI operative bleeding -Good response after 1 unit of PRBCs provided; Hgb has remained stable and currently around 8.1 -per anemia panel appears to be anemia of chronic kidney disease -Safe to continue use of anticoagulation. -Outpatient Epogen/IV iron per nephrology discretion.  2-Acute on chronic diastolic (congestive) heart failure ([name]) -Recent echo June 2024; 60-65% -Continue to follow heart healthy/low-sodium diet -Diuretic dosage has been adjusted to further improve volume control. -Daily weights.  3-Chronic kidney disease a stage IV -Renal function by GFR appears to be at baseline; creatinine slightly higher in the setting of acute diuresis. -At this moment we might have to accept higher levels of creatinine for monitoring and volume control to improve her breathing. -Outpatient follow-up with nephrology service  recommended. -continue to follow low sodium diet   4-DM type 2 (diabetes mellitus, type 2) ([name])  with chronic kidney disease -Continue to follow CBGs -Resume home hypoglycemic regimen and follow modified carbohydrate diet; further adjustment to her therapy to be determined by PCP on follow-up visit. -Recent A1c 8.4  5-Cerebrovascular accident (CVA) due to embolism of right posterior cerebral artery ([name]) -No new deficits appreciated -Continue risk factor modifications. -Patient chronically on Eliquis.  6-Essential hypertension -Overall stable and well-controlled -Continue current antihypertensive regimen. -Heart healthy diet has been encouraged.  7-atrial fibrillation -Rate controlled and stable -Continue the use of Pacerone and diltiazem. -Continue Eliquis.  8-Major depression, recurrent, chronic (HCC) -No suicidal ideation or hallucinations. -continue bupropion and celexa.  9-dyslipidemia -continue statins.  Procedures: See below for x-ray reports.   Consultations:  None   Discharge Exam: Vitals:   09/13/19 0452 09/13/19 0826  BP: (!) 112/41   Pulse: 85   Resp: 20   Temp: 97.7 F (36.5 C)   SpO2: 94% 94%    General: Afebrile, no chest pain, no nausea or vomiting.  Reports breathing back to baseline and is in no acute distress. Cardiovascular: rate controlled, no rubs, no gallops, no JVD on exam.  Positive murmur appreciated. Respiratory: Improved air movement bilaterally; no wheezing, no frank crackles, no using accessory muscles.  Good O2 sat on chronic supplementation. Abdomen: Soft, nontender, nondistended, positive bowel sounds Extremities: Trace edema bilaterally, no cyanosis or clubbing.  Chronic stasis dermatitis appreciated on both legs.  Discharge Instructions   Discharge Instructions    (HEART FAILURE PATIENTS) Call MD:  Anytime you have any of the following symptoms: 1) 3 pound weight gain in 24 hours or 5 pounds in 1 week 2) shortness of  breath, with or without a dry hacking cough 3) swelling in the hands, feet or stomach 4) if you have to sleep on extra pillows at night in order to breathe.   Complete by: As directed    Diet - low sodium heart healthy   Complete by: As directed    Discharge instructions   Complete by: As directed    The medications are prescribed Follow heart healthy/low-sodium diet Maintain adequate hydration Follow-up with PCP in 10 days.     Allergies as of 09/13/2019   No Known Allergies     Medication List    TAKE these medications   acetaminophen 500 MG tablet Commonly known as: TYLENOL Take 1,000 mg by mouth at bedtime. may also take 2 tablets during the day as needed for pain   amiodarone 200 MG tablet Commonly known as: PACERONE Take 1 tablet (200 mg total) by mouth daily.   atorvastatin 20 MG tablet Commonly known as: LIPITOR Take 2 tablets (40 mg total) by mouth every evening. 4pm   buPROPion 300 MG 24 hr tablet Commonly known as: WELLBUTRIN XL Take 1 tablet (300 mg total) by mouth daily.   citalopram 20 MG tablet Commonly known as: CELEXA Take 1 tablet (20 mg total) by mouth daily.   diltiazem 120 MG 24 hr capsule Commonly known as: CARDIZEM CD Take 1 capsule (120 mg total) by mouth daily.   docusate sodium 100 MG capsule Commonly known as: COLACE Take 100 mg by mouth daily.   Eliquis 2.5 MG Tabs tablet Generic drug: apixaban Take 1 tablet (2.5 mg total) by mouth 2 (two) times daily.   insulin glargine 100 UNIT/ML injection Commonly known as: Lantus Inject 0.3 mLs (30 Units total) into the skin at bedtime.   ipratropium-albuterol 0.5-2.5 (3) MG/3ML Soln Commonly known as: DUONEB Take 3 mLs by nebulization every  6 (six) hours.   Magnesium Oxide 250 MG Tabs Take 2 tablets (500 mg total) by mouth every evening.   multivitamin with minerals Tabs tablet Take 1 tablet by mouth at bedtime. For supplement   NovoLOG FlexPen 100 UNIT/ML FlexPen Generic drug: insulin  aspart Inject 10 Units into the skin 3 (three) times daily with meals. insulin pen; 100 unit/mL (3 mL); amt: 10 units; subcutaneous Special Instructions: admin 5 units for CBG >150 Before Meals 08:00 AM, 11:30 AM, 05:00 PM   OXYGEN Inhale 2 L into the lungs continuous.   pantoprazole 40 MG tablet Commonly known as: PROTONIX Take 1 tablet (40 mg total) by mouth daily. For acid reflux/gerd   potassium chloride SA 20 MEQ tablet Commonly known as: KLOR-CON Take 1 tablet (20 mEq total) by mouth daily.   PreserVision AREDS 2 Caps Take 1 capsule by mouth 2 (two) times daily.   torsemide 20 MG tablet Commonly known as: DEMADEX Take 40mg  in am and 20mg  in the evening. What changed:   how much to take  how to take this  when to take this  additional instructions   traZODone 150 MG tablet Commonly known as: DESYREL Take 0.5 tablets (75 mg total) by mouth at bedtime. 0.5 tablet to = 75 mg      No Known Allergies  Follow-up Information    Asencion Noble, MD. Schedule an appointment as soon as possible for a visit in 10 day(s).   Specialty: Internal Medicine Contact information: [address] [place] [address] [phone]        Deboraha Sprang, MD .   Specialty: Cardiology Contact information: [phone] [address] [place] [address] [phone]               The results of significant diagnostics from this hospitalization (including imaging, microbiology, ancillary and laboratory) are listed below for reference.    Significant Diagnostic Studies: DG Chest Port 1 View  Result Date: 09/09/2019 CLINICAL DATA:  Dyspnea EXAM: PORTABLE CHEST 1 VIEW COMPARISON:  07/30/2019 FINDINGS: The lungs are symmetrically expanded. There has developed moderate interstitial pulmonary edema and small bilateral pleural effusions in keeping with changes of moderate cardiogenic failure. No pneumothorax. Cardiac size is mildly enlarged, unchanged. Implanted loop  recorder device again noted overlying the inferior left hemithorax. No acute bone abnormality. IMPRESSION: Moderate cardiogenic failure. Electronically Signed   By: Fidela Salisbury MD   On: 09/09/2019 20:52    Microbiology: Recent Results (from the past 240 hour(s))  SARS Coronavirus 2 by RT PCR (hospital order, performed in [facility] hospital lab) Nasopharyngeal Nasopharyngeal Swab     Status: None   Collection Time: 09/09/19  8:28 PM   Specimen: Nasopharyngeal Swab  Result Value Ref Range Status   SARS Coronavirus 2 NEGATIVE NEGATIVE Final    Comment: (NOTE) SARS-CoV-2 target nucleic acids are NOT DETECTED.  The SARS-CoV-2 RNA is generally detectable in upper and lower respiratory specimens during the acute phase of infection. The lowest concentration of SARS-CoV-2 viral copies this assay can detect is 250 copies / mL. A negative result does not preclude SARS-CoV-2 infection and should not be used as the sole basis for treatment or other patient management decisions.  A negative result may occur with improper specimen collection / handling, submission of specimen other than nasopharyngeal swab, presence of viral mutation(s) within the areas targeted by this assay, and inadequate number of viral copies (<250 copies / mL). A negative result must be combined  with clinical observations, patient history, and epidemiological information.  Fact Sheet for Patients:   StrictlyIdeas.no  Fact Sheet for Healthcare Providers: BankingDealers.co.za  This test is not yet approved or  cleared by the [place] FDA and has been authorized for detection and/or diagnosis of SARS-CoV-2 by FDA under an Emergency Use Authorization (EUA).  This EUA will remain in effect (meaning this test can be used) for the duration of the COVID-19 declaration under Section 564(b)(1) of the Act, 21 U.S.C. section 360bbb-3(b)(1), unless the authorization is terminated  or revoked sooner.  Performed at [facility], [address], [place], [place] [address]      Labs: Basic Metabolic Panel: Recent Labs  Lab 09/09/19 1916 09/10/19 0638 09/12/19 0809 09/13/19 0705  NA 130* 136 136 138  K 3.9 3.5 3.6 3.4*  CL 87* 88* 88* 88*  CO2 33* 35* 37* 38*  GLUCOSE 408* 257* 251* 114*  BUN 55* 53* 47* 48*  CREATININE 2.95* 2.94* 3.05* 3.22*  CALCIUM 9.2 9.8 9.5 9.4  MG 2.2  --   --   --    CBC: Recent Labs  Lab 09/09/19 1916 09/10/19 0638 09/12/19 0809  WBC 7.5 8.5 7.9  NEUTROABS 5.6  --   --   HGB 6.7* 7.9* 8.1*  HCT 24.2* 27.6* 28.5*  MCV 86.7 86.8 86.4  PLT 416* 415* 404*    BNP (last 3 results) Recent Labs    07/30/19 0521 08/02/19 0338 09/09/19 1916  BNP 92.0 137.0* 306.0*    CBG: Recent Labs  Lab 09/12/19 1112 09/12/19 1638 09/12/19 2133 09/13/19 0724 09/13/19 1124  GLUCAP 306* 242* 340* 116* 231*    Signed:  Barton Dubois MD.  [facility] 09/13/2019, 1:56 PM

## 2019-09-13 NOTE — Progress Notes (Signed)
Inpatient Diabetes Program Recommendations  AACE/ADA: New Consensus Statement on Inpatient Glycemic Control   Target Ranges:  Prepandial:   less than 140 mg/dL      Peak postprandial:   less than 180 mg/dL (1-2 hours)      Critically ill patients:  140 - 180 mg/dL   Results for Amy Wiggins, Amy Wiggins (MRN [id number]) as of 09/13/2019 10:31  Ref. Range 09/12/2019 07:24 09/12/2019 11:12 09/12/2019 16:38 09/12/2019 21:33 09/13/2019 07:24  Glucose-Capillary Latest Ref Range: 70 - 99 mg/dL 199 (H) 306 (H) 242 (H) 340 (H) 116 (H)   Review of Glycemic Control  Diabetes history: DM2 Outpatient Diabetes medications: Lantus 30 units QHS, Novolog 10 units TID with meals Current orders for Inpatient glycemic control: Lantus 30 units QHS, Novolog 0-15 units TID with meals, Novolog 0-5 units QHS  Inpatient Diabetes Program Recommendations:    Insulin-Meal Coverage: Please consider ordering Novolog 6 units TID with meals for meal coverage if patient eats at least 50% of meals.  Thanks, Barnie Alderman, RN, MSN, CDE Diabetes Coordinator Inpatient Diabetes Program [phone] (Team Pager from 8am to 5pm)

## 2019-09-13 NOTE — TOC Transition Note (Signed)
Transition of Care [facility]) - CM/SW Discharge Note   Patient Details  Name: Amy Wiggins MRN: [id number] Date of Birth: Nov 17, 1931  Transition of Care [facility]) CM/SW Contact:  Boneta Lucks, RN Phone Number: 09/13/2019, 11:29 AM   Clinical Narrative:   Patient discharging home today. Patient is active with Alvis Lemmings, MD is doing resumption orders for RN/OT/PT. MD is adding a SW as family need to plan for future care.  Patient has home oxygen provided by [employer].  TOC updated [place] with discharge.     Final next level of care: [place] Barriers to Discharge: Barriers Resolved   Patient Goals and CMS Choice Patient states their goals for this hospitalization and ongoing recovery are:: Discharge home CMS Medicare.gov Compare Post Acute Care list provided to:: Patient Represenative (must comment) [name] (granddaughter)) Choice offered to / list presented to : Adult Children [name] (granddaughter))  Discharge Placement  [place]        Discharge Plan and Services In-house Referral: Clinical Social Work Discharge Planning Services: NA Post Acute Care Choice: Home Health                 HH Arranged: PT, OT, RN [facility] Agency: [place] Date [facility] Agency Contacted: 09/13/19 Time HH Agency Contacted: 1129 Representative spoke with at [place]: Georgina Snell   Readmission Risk Interventions Readmission Risk Prevention Plan 09/10/2019  Transportation Screening Complete  PCP or Specialist Appt within 3-5 Days Not Complete  Not Complete comments Granddaughter assists patient with scheduling appointments  [place] or [place] Complete  Social Work Consult for [place] Planning/Counseling Complete  Palliative Care Screening Not Applicable  Medication Review Press photographer) Complete  Some recent data might be hidden

## 2019-09-13 NOTE — Care Management Important Message (Signed)
Important Message  Patient Details  Name: Amy Wiggins MRN: [id number] Date of Birth: February 08, 1932   Medicare Important Message Given:  Yes     [name] 09/13/2019, 2:42 PM

## 2019-09-14 ENCOUNTER — Other Ambulatory Visit: Payer: Self-pay | Admitting: *Deleted

## 2019-09-14 ENCOUNTER — Other Ambulatory Visit: Payer: Self-pay

## 2019-09-14 NOTE — Patient Outreach (Signed)
[facility]) Care Management  09/14/2019  Amy Wiggins 1932/01/06 [id number]   Telephone call to Amy Wiggins who is primary caregiver for patient.  She acknowledges patient recent hospitalizations and SNF stay. She states that patient has lots of health issues and that patient in her estimation is on the start of a decline.  She states patient care is a lot right now and that she is talking with family about other care options such as placement.  She states patient has home care but aides cannot do things like give medications and other skilled needs.  She states this leaves her with a lot of the patient care and is tiring as she is really the only one caring for patient. She states that she and her cousin are patient's HCPOA as patient's two children are deceased.  She states her cousin lives out of town but helps with coordinating the home care for patient.  She states that Alvis Lemmings is involved and she has outreached for resumption of care.  Patient also has palliative care through [facility]. She states that she will be reaching out to the nurse today for evaluation and possible full hospice.  She still is however looking to possible placement for patient as well. Patient does have a long term care policy through [facility] and that they are scheduled to do an assessment on patient to pay for home care.  She states there is also a benefit for placement as well if needed.    Patient with significant history of diastolic CHF, atrial fibrillation on chronic anticoagulation, CVA, diabetes mellitus, hypertension. Patient was recently discharged after a low hemoglobin.  Patient was not found to have active bleed but was transfused for 1 unit of PRBC's. Amy Wiggins currently manages patient medications and care.    Discussed [facility] services and support.  She wants to wait until she meets with palliative care as hospice admission is possible. Advised that CM would outreach next  week on status.  She is in agreement.    Plan: RN CM will outreach next week and granddaughter agreeable.    Jone Baseman, RN, MSN [place] Management Care Management Coordinator Direct Line [phone] Cell [phone] Toll Free: [phone]  Fax: [phone]

## 2019-09-15 DIAGNOSIS — N184 Chronic kidney disease, stage 4 (severe): Secondary | ICD-10-CM | POA: Diagnosis not present

## 2019-09-15 DIAGNOSIS — J189 Pneumonia, unspecified organism: Secondary | ICD-10-CM | POA: Diagnosis not present

## 2019-09-15 DIAGNOSIS — I130 Hypertensive heart and chronic kidney disease with heart failure and stage 1 through stage 4 chronic kidney disease, or unspecified chronic kidney disease: Secondary | ICD-10-CM | POA: Diagnosis not present

## 2019-09-15 DIAGNOSIS — Z515 Encounter for palliative care: Secondary | ICD-10-CM | POA: Diagnosis not present

## 2019-09-15 DIAGNOSIS — I2510 Atherosclerotic heart disease of native coronary artery without angina pectoris: Secondary | ICD-10-CM | POA: Diagnosis not present

## 2019-09-15 DIAGNOSIS — I5032 Chronic diastolic (congestive) heart failure: Secondary | ICD-10-CM | POA: Diagnosis not present

## 2019-09-15 DIAGNOSIS — I480 Paroxysmal atrial fibrillation: Secondary | ICD-10-CM | POA: Diagnosis not present

## 2019-09-15 DIAGNOSIS — E1122 Type 2 diabetes mellitus with diabetic chronic kidney disease: Secondary | ICD-10-CM | POA: Diagnosis not present

## 2019-09-15 DIAGNOSIS — I700 Atherosclerosis of aorta: Secondary | ICD-10-CM | POA: Diagnosis not present

## 2019-09-15 DIAGNOSIS — I5033 Acute on chronic diastolic (congestive) heart failure: Secondary | ICD-10-CM | POA: Diagnosis not present

## 2019-09-15 DIAGNOSIS — J9601 Acute respiratory failure with hypoxia: Secondary | ICD-10-CM | POA: Diagnosis not present

## 2019-09-16 DIAGNOSIS — I130 Hypertensive heart and chronic kidney disease with heart failure and stage 1 through stage 4 chronic kidney disease, or unspecified chronic kidney disease: Secondary | ICD-10-CM | POA: Diagnosis not present

## 2019-09-16 DIAGNOSIS — E1122 Type 2 diabetes mellitus with diabetic chronic kidney disease: Secondary | ICD-10-CM | POA: Diagnosis not present

## 2019-09-16 DIAGNOSIS — I700 Atherosclerosis of aorta: Secondary | ICD-10-CM | POA: Diagnosis not present

## 2019-09-16 DIAGNOSIS — I480 Paroxysmal atrial fibrillation: Secondary | ICD-10-CM | POA: Diagnosis not present

## 2019-09-16 DIAGNOSIS — I5033 Acute on chronic diastolic (congestive) heart failure: Secondary | ICD-10-CM | POA: Diagnosis not present

## 2019-09-16 DIAGNOSIS — J189 Pneumonia, unspecified organism: Secondary | ICD-10-CM | POA: Diagnosis not present

## 2019-09-16 DIAGNOSIS — D649 Anemia, unspecified: Secondary | ICD-10-CM | POA: Diagnosis not present

## 2019-09-16 DIAGNOSIS — N184 Chronic kidney disease, stage 4 (severe): Secondary | ICD-10-CM | POA: Diagnosis not present

## 2019-09-16 DIAGNOSIS — I2510 Atherosclerotic heart disease of native coronary artery without angina pectoris: Secondary | ICD-10-CM | POA: Diagnosis not present

## 2019-09-16 DIAGNOSIS — J9601 Acute respiratory failure with hypoxia: Secondary | ICD-10-CM | POA: Diagnosis not present

## 2019-09-16 DIAGNOSIS — E1129 Type 2 diabetes mellitus with other diabetic kidney complication: Secondary | ICD-10-CM | POA: Diagnosis not present

## 2019-09-17 DIAGNOSIS — I2510 Atherosclerotic heart disease of native coronary artery without angina pectoris: Secondary | ICD-10-CM | POA: Diagnosis not present

## 2019-09-17 DIAGNOSIS — E1122 Type 2 diabetes mellitus with diabetic chronic kidney disease: Secondary | ICD-10-CM | POA: Diagnosis not present

## 2019-09-17 DIAGNOSIS — J9601 Acute respiratory failure with hypoxia: Secondary | ICD-10-CM | POA: Diagnosis not present

## 2019-09-17 DIAGNOSIS — I480 Paroxysmal atrial fibrillation: Secondary | ICD-10-CM | POA: Diagnosis not present

## 2019-09-17 DIAGNOSIS — N184 Chronic kidney disease, stage 4 (severe): Secondary | ICD-10-CM | POA: Diagnosis not present

## 2019-09-17 DIAGNOSIS — I5033 Acute on chronic diastolic (congestive) heart failure: Secondary | ICD-10-CM | POA: Diagnosis not present

## 2019-09-17 DIAGNOSIS — J189 Pneumonia, unspecified organism: Secondary | ICD-10-CM | POA: Diagnosis not present

## 2019-09-17 DIAGNOSIS — I130 Hypertensive heart and chronic kidney disease with heart failure and stage 1 through stage 4 chronic kidney disease, or unspecified chronic kidney disease: Secondary | ICD-10-CM | POA: Diagnosis not present

## 2019-09-17 DIAGNOSIS — I700 Atherosclerosis of aorta: Secondary | ICD-10-CM | POA: Diagnosis not present

## 2019-09-20 DIAGNOSIS — N184 Chronic kidney disease, stage 4 (severe): Secondary | ICD-10-CM | POA: Diagnosis not present

## 2019-09-20 DIAGNOSIS — J189 Pneumonia, unspecified organism: Secondary | ICD-10-CM | POA: Diagnosis not present

## 2019-09-20 DIAGNOSIS — E1122 Type 2 diabetes mellitus with diabetic chronic kidney disease: Secondary | ICD-10-CM | POA: Diagnosis not present

## 2019-09-20 DIAGNOSIS — I2510 Atherosclerotic heart disease of native coronary artery without angina pectoris: Secondary | ICD-10-CM | POA: Diagnosis not present

## 2019-09-20 DIAGNOSIS — I700 Atherosclerosis of aorta: Secondary | ICD-10-CM | POA: Diagnosis not present

## 2019-09-20 DIAGNOSIS — J9601 Acute respiratory failure with hypoxia: Secondary | ICD-10-CM | POA: Diagnosis not present

## 2019-09-20 DIAGNOSIS — I130 Hypertensive heart and chronic kidney disease with heart failure and stage 1 through stage 4 chronic kidney disease, or unspecified chronic kidney disease: Secondary | ICD-10-CM | POA: Diagnosis not present

## 2019-09-20 DIAGNOSIS — I480 Paroxysmal atrial fibrillation: Secondary | ICD-10-CM | POA: Diagnosis not present

## 2019-09-20 DIAGNOSIS — I5033 Acute on chronic diastolic (congestive) heart failure: Secondary | ICD-10-CM | POA: Diagnosis not present

## 2019-09-21 DIAGNOSIS — I700 Atherosclerosis of aorta: Secondary | ICD-10-CM | POA: Diagnosis not present

## 2019-09-21 DIAGNOSIS — I2510 Atherosclerotic heart disease of native coronary artery without angina pectoris: Secondary | ICD-10-CM | POA: Diagnosis not present

## 2019-09-21 DIAGNOSIS — I5033 Acute on chronic diastolic (congestive) heart failure: Secondary | ICD-10-CM | POA: Diagnosis not present

## 2019-09-21 DIAGNOSIS — N184 Chronic kidney disease, stage 4 (severe): Secondary | ICD-10-CM | POA: Diagnosis not present

## 2019-09-21 DIAGNOSIS — J189 Pneumonia, unspecified organism: Secondary | ICD-10-CM | POA: Diagnosis not present

## 2019-09-21 DIAGNOSIS — I480 Paroxysmal atrial fibrillation: Secondary | ICD-10-CM | POA: Diagnosis not present

## 2019-09-21 DIAGNOSIS — J9601 Acute respiratory failure with hypoxia: Secondary | ICD-10-CM | POA: Diagnosis not present

## 2019-09-21 DIAGNOSIS — I130 Hypertensive heart and chronic kidney disease with heart failure and stage 1 through stage 4 chronic kidney disease, or unspecified chronic kidney disease: Secondary | ICD-10-CM | POA: Diagnosis not present

## 2019-09-21 DIAGNOSIS — E1122 Type 2 diabetes mellitus with diabetic chronic kidney disease: Secondary | ICD-10-CM | POA: Diagnosis not present

## 2019-09-22 ENCOUNTER — Other Ambulatory Visit: Payer: Self-pay

## 2019-09-22 DIAGNOSIS — N184 Chronic kidney disease, stage 4 (severe): Secondary | ICD-10-CM | POA: Diagnosis not present

## 2019-09-22 DIAGNOSIS — J189 Pneumonia, unspecified organism: Secondary | ICD-10-CM | POA: Diagnosis not present

## 2019-09-22 DIAGNOSIS — I700 Atherosclerosis of aorta: Secondary | ICD-10-CM | POA: Diagnosis not present

## 2019-09-22 DIAGNOSIS — E1122 Type 2 diabetes mellitus with diabetic chronic kidney disease: Secondary | ICD-10-CM | POA: Diagnosis not present

## 2019-09-22 DIAGNOSIS — I130 Hypertensive heart and chronic kidney disease with heart failure and stage 1 through stage 4 chronic kidney disease, or unspecified chronic kidney disease: Secondary | ICD-10-CM | POA: Diagnosis not present

## 2019-09-22 DIAGNOSIS — I5033 Acute on chronic diastolic (congestive) heart failure: Secondary | ICD-10-CM | POA: Diagnosis not present

## 2019-09-22 DIAGNOSIS — J9601 Acute respiratory failure with hypoxia: Secondary | ICD-10-CM | POA: Diagnosis not present

## 2019-09-22 DIAGNOSIS — I2510 Atherosclerotic heart disease of native coronary artery without angina pectoris: Secondary | ICD-10-CM | POA: Diagnosis not present

## 2019-09-22 DIAGNOSIS — I480 Paroxysmal atrial fibrillation: Secondary | ICD-10-CM | POA: Diagnosis not present

## 2019-09-22 NOTE — Patient Outreach (Addendum)
[facility]) Care Management  09/22/2019  Amy Wiggins Apr 16, 1931 [id number]   Telephone call to granddaughter [name].  She reports that patient is doing a lot better. She reports that they are still looking for possible placement but patient will remain home right now.  Patient was seen by the palliative care nurse and patient will remain palliative for now.   She states that the agency that they are using sometimes just sends personal care aides who are limited to do things like bathing patient etc,  leaving her to complete all patient care.   She states PCP has also recommended assisted living to patient as well and that they have looked at one in particular there in [place] that another family member is there as well. She reports she will continue to talk with patient and other family about placement and go from there.    Discussed continued [facility] services and support. She declined at this time.     Plan: RN CM will send letter and brochure and close case.   Jone Baseman, RN, MSN [place] Management Care Management Coordinator Direct Line [phone] Cell [phone] Toll Free: [phone]  Fax: [phone]

## 2019-09-23 DIAGNOSIS — J9601 Acute respiratory failure with hypoxia: Secondary | ICD-10-CM | POA: Diagnosis not present

## 2019-09-23 DIAGNOSIS — I480 Paroxysmal atrial fibrillation: Secondary | ICD-10-CM | POA: Diagnosis not present

## 2019-09-23 DIAGNOSIS — E1122 Type 2 diabetes mellitus with diabetic chronic kidney disease: Secondary | ICD-10-CM | POA: Diagnosis not present

## 2019-09-23 DIAGNOSIS — I700 Atherosclerosis of aorta: Secondary | ICD-10-CM | POA: Diagnosis not present

## 2019-09-23 DIAGNOSIS — I130 Hypertensive heart and chronic kidney disease with heart failure and stage 1 through stage 4 chronic kidney disease, or unspecified chronic kidney disease: Secondary | ICD-10-CM | POA: Diagnosis not present

## 2019-09-23 DIAGNOSIS — N184 Chronic kidney disease, stage 4 (severe): Secondary | ICD-10-CM | POA: Diagnosis not present

## 2019-09-23 DIAGNOSIS — I5033 Acute on chronic diastolic (congestive) heart failure: Secondary | ICD-10-CM | POA: Diagnosis not present

## 2019-09-23 DIAGNOSIS — I2510 Atherosclerotic heart disease of native coronary artery without angina pectoris: Secondary | ICD-10-CM | POA: Diagnosis not present

## 2019-09-23 DIAGNOSIS — J189 Pneumonia, unspecified organism: Secondary | ICD-10-CM | POA: Diagnosis not present

## 2019-09-24 DIAGNOSIS — I700 Atherosclerosis of aorta: Secondary | ICD-10-CM | POA: Diagnosis not present

## 2019-09-24 DIAGNOSIS — E1122 Type 2 diabetes mellitus with diabetic chronic kidney disease: Secondary | ICD-10-CM | POA: Diagnosis not present

## 2019-09-24 DIAGNOSIS — I130 Hypertensive heart and chronic kidney disease with heart failure and stage 1 through stage 4 chronic kidney disease, or unspecified chronic kidney disease: Secondary | ICD-10-CM | POA: Diagnosis not present

## 2019-09-24 DIAGNOSIS — N184 Chronic kidney disease, stage 4 (severe): Secondary | ICD-10-CM | POA: Diagnosis not present

## 2019-09-24 DIAGNOSIS — J189 Pneumonia, unspecified organism: Secondary | ICD-10-CM | POA: Diagnosis not present

## 2019-09-24 DIAGNOSIS — I5033 Acute on chronic diastolic (congestive) heart failure: Secondary | ICD-10-CM | POA: Diagnosis not present

## 2019-09-24 DIAGNOSIS — I480 Paroxysmal atrial fibrillation: Secondary | ICD-10-CM | POA: Diagnosis not present

## 2019-09-24 DIAGNOSIS — I2510 Atherosclerotic heart disease of native coronary artery without angina pectoris: Secondary | ICD-10-CM | POA: Diagnosis not present

## 2019-09-24 DIAGNOSIS — J9601 Acute respiratory failure with hypoxia: Secondary | ICD-10-CM | POA: Diagnosis not present

## 2019-09-27 DIAGNOSIS — J479 Bronchiectasis, uncomplicated: Secondary | ICD-10-CM | POA: Diagnosis not present

## 2019-09-27 DIAGNOSIS — R42 Dizziness and giddiness: Secondary | ICD-10-CM | POA: Diagnosis not present

## 2019-09-27 DIAGNOSIS — I5033 Acute on chronic diastolic (congestive) heart failure: Secondary | ICD-10-CM | POA: Diagnosis not present

## 2019-09-28 DIAGNOSIS — I130 Hypertensive heart and chronic kidney disease with heart failure and stage 1 through stage 4 chronic kidney disease, or unspecified chronic kidney disease: Secondary | ICD-10-CM | POA: Diagnosis not present

## 2019-09-28 DIAGNOSIS — I2510 Atherosclerotic heart disease of native coronary artery without angina pectoris: Secondary | ICD-10-CM | POA: Diagnosis not present

## 2019-09-28 DIAGNOSIS — J189 Pneumonia, unspecified organism: Secondary | ICD-10-CM | POA: Diagnosis not present

## 2019-09-28 DIAGNOSIS — N184 Chronic kidney disease, stage 4 (severe): Secondary | ICD-10-CM | POA: Diagnosis not present

## 2019-09-28 DIAGNOSIS — I700 Atherosclerosis of aorta: Secondary | ICD-10-CM | POA: Diagnosis not present

## 2019-09-28 DIAGNOSIS — I480 Paroxysmal atrial fibrillation: Secondary | ICD-10-CM | POA: Diagnosis not present

## 2019-09-28 DIAGNOSIS — I5033 Acute on chronic diastolic (congestive) heart failure: Secondary | ICD-10-CM | POA: Diagnosis not present

## 2019-09-28 DIAGNOSIS — E1122 Type 2 diabetes mellitus with diabetic chronic kidney disease: Secondary | ICD-10-CM | POA: Diagnosis not present

## 2019-09-28 DIAGNOSIS — J9601 Acute respiratory failure with hypoxia: Secondary | ICD-10-CM | POA: Diagnosis not present

## 2019-09-29 DIAGNOSIS — E1122 Type 2 diabetes mellitus with diabetic chronic kidney disease: Secondary | ICD-10-CM | POA: Diagnosis not present

## 2019-09-29 DIAGNOSIS — I480 Paroxysmal atrial fibrillation: Secondary | ICD-10-CM | POA: Diagnosis not present

## 2019-09-29 DIAGNOSIS — J189 Pneumonia, unspecified organism: Secondary | ICD-10-CM | POA: Diagnosis not present

## 2019-09-29 DIAGNOSIS — N184 Chronic kidney disease, stage 4 (severe): Secondary | ICD-10-CM | POA: Diagnosis not present

## 2019-09-29 DIAGNOSIS — I700 Atherosclerosis of aorta: Secondary | ICD-10-CM | POA: Diagnosis not present

## 2019-09-29 DIAGNOSIS — I130 Hypertensive heart and chronic kidney disease with heart failure and stage 1 through stage 4 chronic kidney disease, or unspecified chronic kidney disease: Secondary | ICD-10-CM | POA: Diagnosis not present

## 2019-09-29 DIAGNOSIS — I2510 Atherosclerotic heart disease of native coronary artery without angina pectoris: Secondary | ICD-10-CM | POA: Diagnosis not present

## 2019-09-29 DIAGNOSIS — J9601 Acute respiratory failure with hypoxia: Secondary | ICD-10-CM | POA: Diagnosis not present

## 2019-09-29 DIAGNOSIS — I5033 Acute on chronic diastolic (congestive) heart failure: Secondary | ICD-10-CM | POA: Diagnosis not present

## 2019-09-30 DIAGNOSIS — I480 Paroxysmal atrial fibrillation: Secondary | ICD-10-CM | POA: Diagnosis not present

## 2019-09-30 DIAGNOSIS — I2510 Atherosclerotic heart disease of native coronary artery without angina pectoris: Secondary | ICD-10-CM | POA: Diagnosis not present

## 2019-09-30 DIAGNOSIS — J9621 Acute and chronic respiratory failure with hypoxia: Secondary | ICD-10-CM | POA: Diagnosis not present

## 2019-09-30 DIAGNOSIS — I130 Hypertensive heart and chronic kidney disease with heart failure and stage 1 through stage 4 chronic kidney disease, or unspecified chronic kidney disease: Secondary | ICD-10-CM | POA: Diagnosis not present

## 2019-09-30 DIAGNOSIS — E1159 Type 2 diabetes mellitus with other circulatory complications: Secondary | ICD-10-CM | POA: Diagnosis not present

## 2019-09-30 DIAGNOSIS — D631 Anemia in chronic kidney disease: Secondary | ICD-10-CM | POA: Diagnosis not present

## 2019-09-30 DIAGNOSIS — D649 Anemia, unspecified: Secondary | ICD-10-CM | POA: Diagnosis not present

## 2019-09-30 DIAGNOSIS — N184 Chronic kidney disease, stage 4 (severe): Secondary | ICD-10-CM | POA: Diagnosis not present

## 2019-09-30 DIAGNOSIS — E1122 Type 2 diabetes mellitus with diabetic chronic kidney disease: Secondary | ICD-10-CM | POA: Diagnosis not present

## 2019-09-30 DIAGNOSIS — E46 Unspecified protein-calorie malnutrition: Secondary | ICD-10-CM | POA: Diagnosis not present

## 2019-09-30 DIAGNOSIS — I5033 Acute on chronic diastolic (congestive) heart failure: Secondary | ICD-10-CM | POA: Diagnosis not present

## 2019-10-01 DIAGNOSIS — I130 Hypertensive heart and chronic kidney disease with heart failure and stage 1 through stage 4 chronic kidney disease, or unspecified chronic kidney disease: Secondary | ICD-10-CM | POA: Diagnosis not present

## 2019-10-01 DIAGNOSIS — I480 Paroxysmal atrial fibrillation: Secondary | ICD-10-CM | POA: Diagnosis not present

## 2019-10-01 DIAGNOSIS — I2510 Atherosclerotic heart disease of native coronary artery without angina pectoris: Secondary | ICD-10-CM | POA: Diagnosis not present

## 2019-10-01 DIAGNOSIS — I5033 Acute on chronic diastolic (congestive) heart failure: Secondary | ICD-10-CM | POA: Diagnosis not present

## 2019-10-01 DIAGNOSIS — E1159 Type 2 diabetes mellitus with other circulatory complications: Secondary | ICD-10-CM | POA: Diagnosis not present

## 2019-10-01 DIAGNOSIS — N184 Chronic kidney disease, stage 4 (severe): Secondary | ICD-10-CM | POA: Diagnosis not present

## 2019-10-01 DIAGNOSIS — E1122 Type 2 diabetes mellitus with diabetic chronic kidney disease: Secondary | ICD-10-CM | POA: Diagnosis not present

## 2019-10-01 DIAGNOSIS — D631 Anemia in chronic kidney disease: Secondary | ICD-10-CM | POA: Diagnosis not present

## 2019-10-01 DIAGNOSIS — J9621 Acute and chronic respiratory failure with hypoxia: Secondary | ICD-10-CM | POA: Diagnosis not present

## 2019-10-05 DIAGNOSIS — J9621 Acute and chronic respiratory failure with hypoxia: Secondary | ICD-10-CM | POA: Diagnosis not present

## 2019-10-05 DIAGNOSIS — E1122 Type 2 diabetes mellitus with diabetic chronic kidney disease: Secondary | ICD-10-CM | POA: Diagnosis not present

## 2019-10-05 DIAGNOSIS — I480 Paroxysmal atrial fibrillation: Secondary | ICD-10-CM | POA: Diagnosis not present

## 2019-10-05 DIAGNOSIS — D631 Anemia in chronic kidney disease: Secondary | ICD-10-CM | POA: Diagnosis not present

## 2019-10-05 DIAGNOSIS — I130 Hypertensive heart and chronic kidney disease with heart failure and stage 1 through stage 4 chronic kidney disease, or unspecified chronic kidney disease: Secondary | ICD-10-CM | POA: Diagnosis not present

## 2019-10-05 DIAGNOSIS — N184 Chronic kidney disease, stage 4 (severe): Secondary | ICD-10-CM | POA: Diagnosis not present

## 2019-10-05 DIAGNOSIS — E1159 Type 2 diabetes mellitus with other circulatory complications: Secondary | ICD-10-CM | POA: Diagnosis not present

## 2019-10-05 DIAGNOSIS — I2510 Atherosclerotic heart disease of native coronary artery without angina pectoris: Secondary | ICD-10-CM | POA: Diagnosis not present

## 2019-10-05 DIAGNOSIS — I5033 Acute on chronic diastolic (congestive) heart failure: Secondary | ICD-10-CM | POA: Diagnosis not present

## 2019-10-06 ENCOUNTER — Other Ambulatory Visit: Payer: Self-pay

## 2019-10-06 ENCOUNTER — Encounter (HOSPITAL_COMMUNITY): Payer: Self-pay

## 2019-10-06 ENCOUNTER — Ambulatory Visit: Payer: Medicare HMO | Admitting: Physician Assistant

## 2019-10-06 DIAGNOSIS — E1159 Type 2 diabetes mellitus with other circulatory complications: Secondary | ICD-10-CM | POA: Diagnosis not present

## 2019-10-06 DIAGNOSIS — J9621 Acute and chronic respiratory failure with hypoxia: Secondary | ICD-10-CM | POA: Diagnosis not present

## 2019-10-06 DIAGNOSIS — I130 Hypertensive heart and chronic kidney disease with heart failure and stage 1 through stage 4 chronic kidney disease, or unspecified chronic kidney disease: Secondary | ICD-10-CM | POA: Diagnosis not present

## 2019-10-06 DIAGNOSIS — I480 Paroxysmal atrial fibrillation: Secondary | ICD-10-CM | POA: Diagnosis not present

## 2019-10-06 DIAGNOSIS — D631 Anemia in chronic kidney disease: Secondary | ICD-10-CM | POA: Diagnosis not present

## 2019-10-06 DIAGNOSIS — E1122 Type 2 diabetes mellitus with diabetic chronic kidney disease: Secondary | ICD-10-CM | POA: Diagnosis not present

## 2019-10-06 DIAGNOSIS — N184 Chronic kidney disease, stage 4 (severe): Secondary | ICD-10-CM | POA: Diagnosis not present

## 2019-10-06 DIAGNOSIS — I5033 Acute on chronic diastolic (congestive) heart failure: Secondary | ICD-10-CM | POA: Diagnosis not present

## 2019-10-06 DIAGNOSIS — I2510 Atherosclerotic heart disease of native coronary artery without angina pectoris: Secondary | ICD-10-CM | POA: Diagnosis not present

## 2019-10-07 ENCOUNTER — Encounter (HOSPITAL_COMMUNITY): Payer: Self-pay | Admitting: Hematology

## 2019-10-07 ENCOUNTER — Other Ambulatory Visit: Payer: Self-pay

## 2019-10-07 ENCOUNTER — Inpatient Hospital Stay (HOSPITAL_COMMUNITY): Payer: Medicare HMO | Attending: Hematology | Admitting: Hematology

## 2019-10-07 ENCOUNTER — Inpatient Hospital Stay (HOSPITAL_COMMUNITY): Payer: Medicare HMO

## 2019-10-07 VITALS — BP 119/66 | HR 88 | Temp 97.1°F | Resp 17 | Wt 182.1 lb

## 2019-10-07 DIAGNOSIS — E119 Type 2 diabetes mellitus without complications: Secondary | ICD-10-CM | POA: Diagnosis not present

## 2019-10-07 DIAGNOSIS — Z8673 Personal history of transient ischemic attack (TIA), and cerebral infarction without residual deficits: Secondary | ICD-10-CM | POA: Diagnosis not present

## 2019-10-07 DIAGNOSIS — E785 Hyperlipidemia, unspecified: Secondary | ICD-10-CM | POA: Insufficient documentation

## 2019-10-07 DIAGNOSIS — I4891 Unspecified atrial fibrillation: Secondary | ICD-10-CM | POA: Diagnosis not present

## 2019-10-07 DIAGNOSIS — Z79899 Other long term (current) drug therapy: Secondary | ICD-10-CM | POA: Diagnosis not present

## 2019-10-07 DIAGNOSIS — D509 Iron deficiency anemia, unspecified: Secondary | ICD-10-CM | POA: Insufficient documentation

## 2019-10-07 DIAGNOSIS — Z87891 Personal history of nicotine dependence: Secondary | ICD-10-CM | POA: Insufficient documentation

## 2019-10-07 DIAGNOSIS — D649 Anemia, unspecified: Secondary | ICD-10-CM

## 2019-10-07 DIAGNOSIS — Z7901 Long term (current) use of anticoagulants: Secondary | ICD-10-CM | POA: Insufficient documentation

## 2019-10-07 DIAGNOSIS — F329 Major depressive disorder, single episode, unspecified: Secondary | ICD-10-CM | POA: Insufficient documentation

## 2019-10-07 DIAGNOSIS — I129 Hypertensive chronic kidney disease with stage 1 through stage 4 chronic kidney disease, or unspecified chronic kidney disease: Secondary | ICD-10-CM | POA: Diagnosis not present

## 2019-10-07 DIAGNOSIS — N184 Chronic kidney disease, stage 4 (severe): Secondary | ICD-10-CM | POA: Insufficient documentation

## 2019-10-07 DIAGNOSIS — I480 Paroxysmal atrial fibrillation: Secondary | ICD-10-CM | POA: Diagnosis not present

## 2019-10-07 LAB — TSH: TSH: 5.325 u[IU]/mL — ABNORMAL HIGH (ref 0.350–4.500)

## 2019-10-07 LAB — RETICULOCYTES
Immature Retic Fract: 31.6 % — ABNORMAL HIGH (ref 2.3–15.9)
RBC.: 3.36 MIL/uL — ABNORMAL LOW (ref 3.87–5.11)
Retic Count, Absolute: 71.9 10*3/uL (ref 19.0–186.0)
Retic Ct Pct: 2.1 % (ref 0.4–3.1)

## 2019-10-07 LAB — CBC WITH DIFFERENTIAL/PLATELET
Abs Immature Granulocytes: 0.06 10*3/uL (ref 0.00–0.07)
Basophils Absolute: 0.1 10*3/uL (ref 0.0–0.1)
Basophils Relative: 1 %
Eosinophils Absolute: 0.5 10*3/uL (ref 0.0–0.5)
Eosinophils Relative: 6 %
HCT: 27.4 % — ABNORMAL LOW (ref 36.0–46.0)
Hemoglobin: 7.5 g/dL — ABNORMAL LOW (ref 12.0–15.0)
Immature Granulocytes: 1 %
Lymphocytes Relative: 11 %
Lymphs Abs: 0.9 10*3/uL (ref 0.7–4.0)
MCH: 22.4 pg — ABNORMAL LOW (ref 26.0–34.0)
MCHC: 27.4 g/dL — ABNORMAL LOW (ref 30.0–36.0)
MCV: 81.8 fL (ref 80.0–100.0)
Monocytes Absolute: 0.7 10*3/uL (ref 0.1–1.0)
Monocytes Relative: 9 %
Neutro Abs: 6 10*3/uL (ref 1.7–7.7)
Neutrophils Relative %: 72 %
Platelets: 407 10*3/uL — ABNORMAL HIGH (ref 150–400)
RBC: 3.35 MIL/uL — ABNORMAL LOW (ref 3.87–5.11)
RDW: 16.5 % — ABNORMAL HIGH (ref 11.5–15.5)
WBC: 8.1 10*3/uL (ref 4.0–10.5)
nRBC: 0 % (ref 0.0–0.2)

## 2019-10-07 LAB — DIRECT ANTIGLOBULIN TEST (NOT AT ARMC)
DAT, IgG: NEGATIVE
DAT, complement: NEGATIVE

## 2019-10-07 LAB — VITAMIN B12: Vitamin B-12: 927 pg/mL — ABNORMAL HIGH (ref 180–914)

## 2019-10-07 LAB — LACTATE DEHYDROGENASE: LDH: 178 U/L (ref 98–192)

## 2019-10-07 LAB — FOLATE: Folate: 15.1 ng/mL (ref >5.9)

## 2019-10-07 NOTE — Patient Instructions (Signed)
[place] at [facility] Discharge Instructions  You were seen and examined by Dr. Delton Coombes today. You were referred to [place] because of anemia. Your iron levels were low in June. This is typical of patients with kidney function issues.  You will go to the lab today for multiple blood levels, including iron and proteins that promote blood cell formation.  Dr. Delton Coombes suspects that you will require iron infusions. If your blood counts remain low despite iron infusions, you may require monthly injections of a medication called Procrit. Boosting the iron present in your body will help with the breathing issues.  You will return to the clinic in one to two weeks.   Thank you for choosing [place] at [facility] to provide your oncology and hematology care.  To afford each patient quality time with our provider, please arrive at least 15 minutes before your scheduled appointment time.   If you have a lab appointment with the [facility] please come in thru the Main Entrance and check in at the main information desk.  You need to re-schedule your appointment should you arrive 10 or more minutes late.  We strive to give you quality time with our providers, and arriving late affects you and other patients whose appointments are after yours.  Also, if you no show three or more times for appointments you may be dismissed from the clinic at the providers discretion.     Again, thank you for choosing [facility].  Our hope is that these requests will decrease the amount of time that you wait before being seen by our physicians.       _____________________________________________________________  Should you have questions after your visit to [facility], please contact our office at [phone] and follow the prompts.  Our office hours are 8:00 a.m. and 4:30 p.m. Monday - Friday.  Please note that voicemails left after 4:00 p.m.  may not be returned until the following business day.  We are closed weekends and major holidays.  You do have access to a nurse 24-7, just call the main number to the clinic [phone] and do not press any options, hold on the line and a nurse will answer the phone.    For prescription refill requests, have your pharmacy contact our office and allow 72 hours.    Due to Covid, you will need to wear a mask upon entering the hospital. If you do not have a mask, a mask will be given to you at the Main Entrance upon arrival. For doctor visits, patients may have 1 support person age 40 or older with them. For treatment visits, patients can not have anyone with them due to social distancing guidelines and our immunocompromised population.

## 2019-10-07 NOTE — Progress Notes (Signed)
[facility] [address] [place] [address]   CLINIC:  Medical Oncology/Hematology  Patient Care Team: Asencion Noble, MD as PCP - General (Internal Medicine) Deboraha Sprang, MD as PCP - Cardiology (Cardiology)  CHIEF COMPLAINTS/PURPOSE OF CONSULTATION:  Evaluation of anemia  HISTORY OF PRESENTING ILLNESS:  Amy Wiggins 86 y.o. female is here because of evaluation of anemia, at the request of Dr. Asencion Noble. She has been anemic since 09/29/2018 when she was hospitalized for SOB and generalized weakness. She received a blood transfusion while she was hospitalized.  Today she is accompanied by her granddaughters. She reports feeling okay. Her breathing has improved since hospital discharge, but she still gets SOB with exertion. She denies melena, hematochezia or hematuria. She does not recall taking iron tablets before. She is taking torsemide BID and oxygen via [place] for her heart failure. She does not have any history of cancer; she denies F/C, night sweats, or unexpected weight loss. Her appetite is good.  She is living at home now. She is mainly sedentary at home. She is not steady when she walks a uses cane. She denies any history of cancer or anemia in her family. She quit smoking at age 68. She used to work at [employer].   MEDICAL HISTORY:  Past Medical History:  Diagnosis Date  . Chronic anticoagulation   . Depression   . Diabetes mellitus   . Hyperlipidemia   . Hypertension   . PAF (paroxysmal atrial fibrillation) ([place])   . PFO (patent foramen ovale)   . Stroke ([place])    TIA's x 2    SURGICAL HISTORY: Past Surgical History:  Procedure Laterality Date  . COLONOSCOPY    . COLONOSCOPY N/A 09/09/2014   Procedure: COLONOSCOPY;  Surgeon: Rogene Houston, MD;  [facility];  Service: Endoscopy;  Laterality: N/A;  1010 - moved to 7:30 - Ann to notify  . EP IMPLANTABLE DEVICE N/A 06/07/2015   Procedure: Loop Recorder Insertion;   Surgeon: Deboraha Sprang, MD;  Location: [place];  Service: Cardiovascular;  Laterality: N/A;  . TEE WITHOUT CARDIOVERSION N/A 06/07/2015   Procedure: TRANSESOPHAGEAL ECHOCARDIOGRAM (TEE);  Surgeon: Thayer Headings, MD;  Location: [facility] ENDOSCOPY;  Service: Cardiovascular;  Laterality: N/A;    SOCIAL HISTORY: Social History   Socioeconomic History  . Marital status: Widowed    Spouse name: Not on file  . Number of children: 2  . Years of education: Not on file  . Highest education level: Not on file  Occupational History  . Occupation: retired  Tobacco Use  . Smoking status: Former Smoker    Years: 10.00  . Smokeless tobacco: Never Used  Vaping Use  . Vaping Use: Never used  Substance and Sexual Activity  . Alcohol use: No  . Drug use: No  . Sexual activity: Never  Other Topics Concern  . Not on file  Social History Narrative  . Not on file   Social Determinants of Health   Financial Resource Strain:   . Difficulty of Paying Living Expenses:   Food Insecurity:   . Worried About Charity fundraiser in the Last Year:   . Arboriculturist in the Last Year:   Transportation Needs: No Transportation Needs  . Lack of Transportation (Medical): No  . Lack of Transportation (Non-Medical): No  Physical Activity:   . Days of Exercise per Week:   . Minutes of Exercise per Session:  Stress:   . Feeling of Stress :   Social Connections:   . Frequency of Communication with Friends and Family:   . Frequency of Social Gatherings with Friends and Family:   . Attends Religious Services:   . Active Member of Clubs or Organizations:   . Attends Archivist Meetings:   Marland Kitchen Marital Status:   Intimate Partner Violence:   . Fear of Current or Ex-Partner:   . Emotionally Abused:   Marland Kitchen Physically Abused:   . Sexually Abused:     FAMILY HISTORY: Family History  Problem Relation Age of Onset  . Stroke Father   . Dementia Sister   . Atrial fibrillation Sister   .  Parkinson's disease Sister   . Heart attack Daughter   . Diabetes Son   . Hypertension Son     ALLERGIES:  has No Known Allergies.  MEDICATIONS:  Current Outpatient Medications  Medication Sig Dispense Refill  . acetaminophen (TYLENOL) 500 MG tablet Take 1,000 mg by mouth at bedtime. may also take 2 tablets during the day as needed for pain    . amiodarone (PACERONE) 200 MG tablet Take 1 tablet (200 mg total) by mouth daily. 30 tablet 0  . atorvastatin (LIPITOR) 20 MG tablet Take 2 tablets (40 mg total) by mouth every evening. 4pm 60 tablet 0  . buPROPion (WELLBUTRIN XL) 300 MG 24 hr tablet Take 1 tablet (300 mg total) by mouth daily. 30 tablet 0  . citalopram (CELEXA) 20 MG tablet Take 1 tablet (20 mg total) by mouth daily. 30 tablet 0  . diltiazem (CARDIZEM CD) 120 MG 24 hr capsule Take 1 capsule (120 mg total) by mouth daily. 30 capsule 0  . docusate sodium (COLACE) 100 MG capsule Take 100 mg by mouth daily.    Marland Kitchen ELIQUIS 2.5 MG TABS tablet Take 1 tablet (2.5 mg total) by mouth 2 (two) times daily. 60 tablet 0  . insulin aspart (NOVOLOG FLEXPEN) 100 UNIT/ML FlexPen Inject 10 Units into the skin 3 (three) times daily with meals. insulin pen; 100 unit/mL (3 mL); amt: 10 units; subcutaneous Special Instructions: admin 5 units for CBG >150 Before Meals 08:00 AM, 11:30 AM, 05:00 PM 15 mL 0  . insulin glargine (LANTUS) 100 UNIT/ML injection Inject 0.3 mLs (30 Units total) into the skin at bedtime. 15 mL 0  . ipratropium-albuterol (DUONEB) 0.5-2.5 (3) MG/3ML SOLN Take 3 mLs by nebulization every 6 (six) hours. 360 mL 0  . Magnesium Oxide 250 MG TABS Take 2 tablets (500 mg total) by mouth every evening. 60 tablet 0  . Multiple Vitamin (MULTIVITAMIN WITH MINERALS) TABS tablet Take 1 tablet by mouth at bedtime. For supplement    . Multiple Vitamins-Minerals (PRESERVISION AREDS 2) CAPS Take 1 capsule by mouth 2 (two) times daily.    . OXYGEN Inhale 2 L into the lungs continuous.    . pantoprazole  (PROTONIX) 40 MG tablet Take 1 tablet (40 mg total) by mouth daily. For acid reflux/gerd 30 tablet 0  . potassium chloride SA (KLOR-CON) 20 MEQ tablet Take 1 tablet (20 mEq total) by mouth daily. 30 tablet 0  . torsemide (DEMADEX) 20 MG tablet Take 64m in am and 255min the evening. 90 tablet 1  . traZODone (DESYREL) 150 MG tablet Take 0.5 tablets (75 mg total) by mouth at bedtime. 0.5 tablet to = 75 mg 15 tablet 0   No current facility-administered medications for this visit.    REVIEW OF SYSTEMS:  Review of Systems  Constitutional: Positive for fatigue (mild). Negative for appetite change, chills, diaphoresis, fever and unexpected weight change.  Gastrointestinal: Positive for constipation and diarrhea. Negative for blood in stool.  Genitourinary: Negative for hematuria.   Neurological: Positive for dizziness.  All other systems reviewed and are negative.    PHYSICAL EXAMINATION: ECOG PERFORMANCE STATUS: 2 - Symptomatic, <50% confined to bed  Vitals:   10/07/19 1449  BP: 119/66  Pulse: 88  Resp: 17  Temp: (!) 97.1 F (36.2 C)  SpO2: 97%   Filed Weights   10/07/19 1449  Weight: 182 lb 1.6 oz (82.6 kg)   Physical Exam Vitals reviewed.  Constitutional:      Appearance: Normal appearance. She is obese.     Interventions: Nasal cannula in place.  Cardiovascular:     Rate and Rhythm: Normal rate and regular rhythm.     Pulses: Normal pulses.     Heart sounds: Normal heart sounds.  Pulmonary:     Effort: Pulmonary effort is normal.     Breath sounds: Examination of the right-lower field reveals rhonchi. Examination of the left-lower field reveals rhonchi. Rhonchi present.  Abdominal:     Palpations: Abdomen is soft. There is no hepatomegaly, splenomegaly or mass.     Tenderness: There is no abdominal tenderness.  Musculoskeletal:     Right lower leg: Edema (trace) present.     Left lower leg: Edema (trace) present.  Lymphadenopathy:     Cervical: No cervical  adenopathy.     Upper Body:     Right upper body: No supraclavicular or axillary adenopathy.     Left upper body: No supraclavicular or axillary adenopathy.  Neurological:     General: No focal deficit present.     Mental Status: She is alert and oriented to person, place, and time.  Psychiatric:        Mood and Affect: Mood normal.        Behavior: Behavior normal.      LABORATORY DATA:  I have reviewed the data as listed Recent Results (from the past 2160 hour(s))  Basic metabolic panel     Status: Abnormal   Collection Time: 07/09/19  3:35 PM  Result Value Ref Range   Glucose 402 (H) 65 - 99 mg/dL   BUN 37 (H) 8 - 27 mg/dL   Creatinine, Ser 3.05 (H) 0.57 - 1.00 mg/dL   GFR calc non Af Amer 13 (L) >59 mL/min/1.73   GFR calc Af Amer 15 (L) >59 mL/min/1.73    Comment: **[facility] currently reports eGFR in compliance with the current**   recommendations of the [employer]. [facility] will   update reporting as new guidelines are published from the NKF-ASN   Task force.    BUN/Creatinine Ratio 12 12 - 28   Sodium 134 134 - 144 mmol/L   Potassium 5.4 (H) 3.5 - 5.2 mmol/L   Chloride 96 96 - 106 mmol/L   CO2 23 20 - 29 mmol/L   Calcium 9.7 8.7 - 10.3 mg/dL  Lipid panel     Status: Abnormal   Collection Time: 07/09/19  3:35 PM  Result Value Ref Range   Cholesterol, Total 153 100 - 199 mg/dL   Triglycerides 300 (H) 0 - 149 mg/dL   HDL 35 (L) >39 mg/dL   VLDL Cholesterol Cal 48 (H) 5 - 40 mg/dL   LDL Chol Calc (NIH) 70 0 - 99 mg/dL   Chol/HDL Ratio 4.4 0.0 - 4.4 ratio  Comment:                                   T. Chol/HDL Ratio                                             Men  Women                               1/2 Avg.Risk  3.4    3.3                                   Avg.Risk  5.0    4.4                                2X Avg.Risk  9.6    7.1                                3X Avg.Risk 23.4   11.0   CBC     Status: Abnormal   Collection Time: 07/09/19  3:35  PM  Result Value Ref Range   WBC 8.2 3.4 - 10.8 x10E3/uL   RBC 3.62 (L) 3.77 - 5.28 x10E6/uL   Hemoglobin 9.5 (L) 11.1 - 15.9 g/dL   Hematocrit 31.0 (L) 34.0 - 46.6 %   MCV 86 79 - 97 fL   MCH 26.2 (L) 26.6 - 33.0 pg   MCHC 30.6 (L) 31 - 35 g/dL   RDW 14.8 11.7 - 15.4 %   Platelets 356 150 - 450 x10E3/uL  TSH     Status: Abnormal   Collection Time: 07/09/19  3:35 PM  Result Value Ref Range   TSH 5.280 (H) 0.450 - 4.500 uIU/mL  Basic metabolic panel     Status: Abnormal   Collection Time: 07/20/19  8:25 AM  Result Value Ref Range   Glucose 204 (H) 65 - 99 mg/dL   BUN 34 (H) 8 - 27 mg/dL   Creatinine, Ser 2.39 (H) 0.57 - 1.00 mg/dL   GFR calc non Af Amer 18 (L) >59 mL/min/1.73   GFR calc Af Amer 21 (L) >59 mL/min/1.73    Comment: **[facility] currently reports eGFR in compliance with the current**   recommendations of the [employer]. [facility] will   update reporting as new guidelines are published from the NKF-ASN   Task force.    BUN/Creatinine Ratio 14 12 - 28   Sodium 136 134 - 144 mmol/L   Potassium 3.9 3.5 - 5.2 mmol/L   Chloride 97 96 - 106 mmol/L   CO2 21 20 - 29 mmol/L   Calcium 10.4 (H) 8.7 - 10.3 mg/dL  SARS Coronavirus 2 by RT PCR (hospital order, performed in [facility] hospital lab) Nasopharyngeal Nasopharyngeal Swab     Status: None   Collection Time: 07/24/19  8:31 PM   Specimen: Nasopharyngeal Swab  Result Value Ref Range   SARS Coronavirus 2 NEGATIVE NEGATIVE    Comment: (NOTE) SARS-CoV-2 target nucleic acids are NOT DETECTED. The SARS-CoV-2 RNA is generally detectable in upper  and lower respiratory specimens during the acute phase of infection. The lowest concentration of SARS-CoV-2 viral copies this assay can detect is 250 copies / mL. A negative result does not preclude SARS-CoV-2 infection and should not be used as the sole basis for treatment or other patient management decisions.  A negative result may occur with improper specimen  collection / handling, submission of specimen other than nasopharyngeal swab, presence of viral mutation(s) within the areas targeted by this assay, and inadequate number of viral copies (<250 copies / mL). A negative result must be combined with clinical observations, patient history, and epidemiological information. Fact Sheet for Patients:   StrictlyIdeas.no Fact Sheet for Healthcare Providers: BankingDealers.co.za This test is not yet approved or cleared  by the [place] FDA and has been authorized for detection and/or diagnosis of SARS-CoV-2 by FDA under an Emergency Use Authorization (EUA).  This EUA will remain in effect (meaning this test can be used) for the duration of the COVID-19 declaration under Section 564(b)(1) of the Act, 21 U.S.C. section 360bbb-3(b)(1), unless the authorization is terminated or revoked sooner. Performed at [facility], [address] [place], [place] [address]   Basic metabolic panel     Status: Abnormal   Collection Time: 07/24/19  8:47 PM  Result Value Ref Range   Sodium 131 (L) 135 - 145 mmol/L   Potassium 4.1 3.5 - 5.1 mmol/L   Chloride 95 (L) 98 - 111 mmol/L   CO2 26 22 - 32 mmol/L   Glucose, Bld 205 (H) 70 - 99 mg/dL    Comment: Glucose reference range applies only to samples taken after fasting for at least 8 hours.   BUN 41 (H) 8 - 23 mg/dL   Creatinine, Ser 2.50 (H) 0.44 - 1.00 mg/dL   Calcium 9.6 8.9 - 10.3 mg/dL   GFR calc non Af Amer 17 (L) >60 mL/min   GFR calc Af Amer 20 (L) >60 mL/min   Anion gap 10 5 - 15    Comment: Performed at [facility], [address], [place], [place] [address]  CBC     Status: Abnormal   Collection Time: 07/24/19  8:47 PM  Result Value Ref Range   WBC 13.3 (H) 4.0 - 10.5 K/uL   RBC 3.49 (L) 3.87 - 5.11 MIL/uL   Hemoglobin 9.0 (L) 12.0 - 15.0 g/dL   HCT 31.3 (L) 36 - 46 %   MCV 89.7 80.0 - 100.0 fL   MCH 25.8 (L) 26.0 - 34.0 pg   MCHC 28.8 (L) 30.0  - 36.0 g/dL   RDW 16.0 (H) 11.5 - 15.5 %   Platelets 422 (H) 150 - 400 K/uL   nRBC 0.2 0.0 - 0.2 %    Comment: Performed at [facility], [address], [place], [place] [address]  Brain natriuretic peptide     Status: Abnormal   Collection Time: 07/24/19  8:48 PM  Result Value Ref Range   B Natriuretic Peptide 365.0 (H) 0.0 - 100.0 pg/mL    Comment: Performed at [facility], [address] [place], [place] [address]  Troponin I (High Sensitivity)     Status: None   Collection Time: 07/24/19  8:48 PM  Result Value Ref Range   Troponin I (High Sensitivity) 13 <18 ng/L    Comment: (NOTE) Elevated high sensitivity troponin I (hsTnI) values and significant  changes across serial measurements may suggest ACS but many other  chronic and acute conditions are known to elevate hsTnI results.  Refer to the "Links" section for chest pain algorithms and additional  guidance. Performed at [facility], [address] [place], [place] [address]   Troponin I (High Sensitivity)     Status: None   Collection Time: 07/24/19  9:56 PM  Result Value Ref Range   Troponin I (High Sensitivity) 14 <18 ng/L    Comment: (NOTE) Elevated high sensitivity troponin I (hsTnI) values and significant  changes across serial measurements may suggest ACS but many other  chronic and acute conditions are known to elevate hsTnI results.  Refer to the "Links" section for chest pain algorithms and additional  guidance. Performed at [facility], [address], [place], [place] [address]   Glucose, capillary     Status: Abnormal   Collection Time: 07/25/19 12:57 AM  Result Value Ref Range   Glucose-Capillary 207 (H) 70 - 99 mg/dL    Comment: Glucose reference range applies only to samples taken after fasting for at least 8 hours.  Hemoglobin A1c     Status: Abnormal   Collection Time: 07/25/19  6:35 AM  Result Value Ref Range   Hgb A1c MFr Bld 8.4 (H) 4.8 - 5.6 %    Comment: (NOTE) Pre diabetes:           5.7%-6.4% Diabetes:              >6.4% Glycemic control for   <7.0% adults with diabetes    Mean Plasma Glucose 194.38 mg/dL    Comment: Performed at [facility] [address], [place], [place] [address]  Basic metabolic panel     Status: Abnormal   Collection Time: 07/25/19  6:35 AM  Result Value Ref Range   Sodium 131 (L) 135 - 145 mmol/L   Potassium 3.4 (L) 3.5 - 5.1 mmol/L   Chloride 97 (L) 98 - 111 mmol/L   CO2 25 22 - 32 mmol/L   Glucose, Bld 295 (H) 70 - 99 mg/dL    Comment: Glucose reference range applies only to samples taken after fasting for at least 8 hours.   BUN 39 (H) 8 - 23 mg/dL   Creatinine, Ser 2.43 (H) 0.44 - 1.00 mg/dL   Calcium 9.1 8.9 - 10.3 mg/dL   GFR calc non Af Amer 18 (L) >60 mL/min   GFR calc Af Amer 20 (L) >60 mL/min   Anion gap 9 5 - 15    Comment: Performed at [facility], [address], [place], [place] [address]  Glucose, capillary     Status: Abnormal   Collection Time: 07/25/19  7:57 AM  Result Value Ref Range   Glucose-Capillary 243 (H) 70 - 99 mg/dL    Comment: Glucose reference range applies only to samples taken after fasting for at least 8 hours.   Comment 1 Notify RN    Comment 2 Document in Chart   Glucose, capillary     Status: Abnormal   Collection Time: 07/25/19 11:53 AM  Result Value Ref Range   Glucose-Capillary 266 (H) 70 - 99 mg/dL    Comment: Glucose reference range applies only to samples taken after fasting for at least 8 hours.   Comment 1 Notify RN    Comment 2 Document in Chart   Glucose, capillary     Status: Abnormal   Collection Time: 07/25/19  4:34 PM  Result Value Ref Range   Glucose-Capillary 211 (H) 70 - 99 mg/dL    Comment: Glucose reference range applies only to samples taken  after fasting for at least 8 hours.   Comment 1 Notify RN    Comment 2 Document in Chart   Glucose, capillary     Status: Abnormal   Collection Time: 07/25/19  9:49 PM  Result Value Ref Range   Glucose-Capillary 192 (H) 70 -  99 mg/dL    Comment: Glucose reference range applies only to samples taken after fasting for at least 8 hours.  Glucose, capillary     Status: Abnormal   Collection Time: 07/26/19  3:33 AM  Result Value Ref Range   Glucose-Capillary 136 (H) 70 - 99 mg/dL    Comment: Glucose reference range applies only to samples taken after fasting for at least 8 hours.  Basic metabolic panel     Status: Abnormal   Collection Time: 07/26/19  5:58 AM  Result Value Ref Range   Sodium 135 135 - 145 mmol/L   Potassium 3.6 3.5 - 5.1 mmol/L   Chloride 98 98 - 111 mmol/L   CO2 28 22 - 32 mmol/L   Glucose, Bld 125 (H) 70 - 99 mg/dL    Comment: Glucose reference range applies only to samples taken after fasting for at least 8 hours.   BUN 41 (H) 8 - 23 mg/dL   Creatinine, Ser 2.85 (H) 0.44 - 1.00 mg/dL   Calcium 9.3 8.9 - 10.3 mg/dL   GFR calc non Af Amer 14 (L) >60 mL/min   GFR calc Af Amer 17 (L) >60 mL/min   Anion gap 9 5 - 15    Comment: Performed at [facility], [address] [place], [place] [address]  CBC with Differential/Platelet     Status: Abnormal   Collection Time: 07/26/19  5:58 AM  Result Value Ref Range   WBC 9.8 4.0 - 10.5 K/uL   RBC 3.03 (L) 3.87 - 5.11 MIL/uL   Hemoglobin 7.8 (L) 12.0 - 15.0 g/dL   HCT 27.0 (L) 36 - 46 %   MCV 89.1 80.0 - 100.0 fL   MCH 25.7 (L) 26.0 - 34.0 pg   MCHC 28.9 (L) 30.0 - 36.0 g/dL   RDW 15.9 (H) 11.5 - 15.5 %   Platelets 378 150 - 400 K/uL   nRBC 0.2 0.0 - 0.2 %   Neutrophils Relative % 64 %   Neutro Abs 6.4 1.7 - 7.7 K/uL   Lymphocytes Relative 13 %   Lymphs Abs 1.3 0.7 - 4.0 K/uL   Monocytes Relative 14 %   Monocytes Absolute 1.3 (H) 0 - 1 K/uL   Eosinophils Relative 7 %   Eosinophils Absolute 0.7 (H) 0 - 0 K/uL   Basophils Relative 1 %   Basophils Absolute 0.1 0 - 0 K/uL   Immature Granulocytes 1 %   Abs Immature Granulocytes 0.09 (H) 0.00 - 0.07 K/uL    Comment: Performed at [facility], [address], [place], [place] [address]   Magnesium     Status: None   Collection Time: 07/26/19  5:58 AM  Result Value Ref Range   Magnesium 1.8 1.7 - 2.4 mg/dL    Comment: Performed at [facility], [address], [place], [place] [address]  Glucose, capillary     Status: Abnormal   Collection Time: 07/26/19  7:39 AM  Result Value Ref Range   Glucose-Capillary 101 (H) 70 - 99 mg/dL    Comment: Glucose reference range applies only to samples taken after fasting for at least 8 hours.  Glucose, capillary     Status:  None   Collection Time: 07/26/19 11:18 AM  Result Value Ref Range   Glucose-Capillary 96 70 - 99 mg/dL    Comment: Glucose reference range applies only to samples taken after fasting for at least 8 hours.   Comment 1 Notify RN    Comment 2 Document in Chart   Glucose, capillary     Status: Abnormal   Collection Time: 07/26/19  4:20 PM  Result Value Ref Range   Glucose-Capillary 302 (H) 70 - 99 mg/dL    Comment: Glucose reference range applies only to samples taken after fasting for at least 8 hours.   Comment 1 Notify RN    Comment 2 Document in Chart   Glucose, capillary     Status: Abnormal   Collection Time: 07/26/19  8:26 PM  Result Value Ref Range   Glucose-Capillary 201 (H) 70 - 99 mg/dL    Comment: Glucose reference range applies only to samples taken after fasting for at least 8 hours.  Glucose, capillary     Status: Abnormal   Collection Time: 07/27/19  3:00 AM  Result Value Ref Range   Glucose-Capillary 118 (H) 70 - 99 mg/dL    Comment: Glucose reference range applies only to samples taken after fasting for at least 8 hours.  Basic metabolic panel     Status: Abnormal   Collection Time: 07/27/19  5:14 AM  Result Value Ref Range   Sodium 135 135 - 145 mmol/L   Potassium 3.4 (L) 3.5 - 5.1 mmol/L   Chloride 97 (L) 98 - 111 mmol/L   CO2 27 22 - 32 mmol/L   Glucose, Bld 144 (H) 70 - 99 mg/dL    Comment: Glucose reference range applies only to samples taken after fasting for at least 8 hours.   BUN  47 (H) 8 - 23 mg/dL   Creatinine, Ser 2.83 (H) 0.44 - 1.00 mg/dL   Calcium 9.0 8.9 - 10.3 mg/dL   GFR calc non Af Amer 15 (L) >60 mL/min   GFR calc Af Amer 17 (L) >60 mL/min   Anion gap 11 5 - 15    Comment: Performed at [facility], [address], [place], [place] [address]  CBC     Status: Abnormal   Collection Time: 07/27/19  5:14 AM  Result Value Ref Range   WBC 8.9 4.0 - 10.5 K/uL   RBC 2.98 (L) 3.87 - 5.11 MIL/uL   Hemoglobin 7.7 (L) 12.0 - 15.0 g/dL   HCT 26.5 (L) 36 - 46 %   MCV 88.9 80.0 - 100.0 fL   MCH 25.8 (L) 26.0 - 34.0 pg   MCHC 29.1 (L) 30.0 - 36.0 g/dL   RDW 15.9 (H) 11.5 - 15.5 %   Platelets 392 150 - 400 K/uL   nRBC 0.0 0.0 - 0.2 %    Comment: Performed at [facility], [address] [place], [place] [address]  Magnesium     Status: None   Collection Time: 07/27/19  5:14 AM  Result Value Ref Range   Magnesium 1.8 1.7 - 2.4 mg/dL    Comment: Performed at [facility], [address], [place], [place] [address]  Glucose, capillary     Status: Abnormal   Collection Time: 07/27/19  7:27 AM  Result Value Ref Range   Glucose-Capillary 139 (H) 70 - 99 mg/dL    Comment: Glucose reference range applies only to samples taken after fasting for at least 8 hours.  Glucose, capillary  Status: Abnormal   Collection Time: 07/27/19 11:11 AM  Result Value Ref Range   Glucose-Capillary 278 (H) 70 - 99 mg/dL    Comment: Glucose reference range applies only to samples taken after fasting for at least 8 hours.  Glucose, capillary     Status: Abnormal   Collection Time: 07/27/19  5:07 PM  Result Value Ref Range   Glucose-Capillary 179 (H) 70 - 99 mg/dL    Comment: Glucose reference range applies only to samples taken after fasting for at least 8 hours.  Glucose, capillary     Status: Abnormal   Collection Time: 07/27/19  8:26 PM  Result Value Ref Range   Glucose-Capillary 157 (H) 70 - 99 mg/dL    Comment: Glucose reference range applies only to samples taken after fasting  for at least 8 hours.  Glucose, capillary     Status: Abnormal   Collection Time: 07/28/19  2:59 AM  Result Value Ref Range   Glucose-Capillary 154 (H) 70 - 99 mg/dL    Comment: Glucose reference range applies only to samples taken after fasting for at least 8 hours.  Basic metabolic panel     Status: Abnormal   Collection Time: 07/28/19  5:42 AM  Result Value Ref Range   Sodium 135 135 - 145 mmol/L   Potassium 4.9 3.5 - 5.1 mmol/L    Comment: DELTA CHECK NOTED   Chloride 98 98 - 111 mmol/L   CO2 30 22 - 32 mmol/L   Glucose, Bld 166 (H) 70 - 99 mg/dL    Comment: Glucose reference range applies only to samples taken after fasting for at least 8 hours.   BUN 46 (H) 8 - 23 mg/dL   Creatinine, Ser 2.64 (H) 0.44 - 1.00 mg/dL   Calcium 9.4 8.9 - 10.3 mg/dL   GFR calc non Af Amer 16 (L) >60 mL/min   GFR calc Af Amer 18 (L) >60 mL/min   Anion gap 7 5 - 15    Comment: Performed at [facility] [facility], [address], [place], [place] [address]  CBC     Status: Abnormal   Collection Time: 07/28/19  5:42 AM  Result Value Ref Range   WBC 9.1 4.0 - 10.5 K/uL   RBC 3.03 (L) 3.87 - 5.11 MIL/uL   Hemoglobin 7.9 (L) 12.0 - 15.0 g/dL   HCT 27.2 (L) 36 - 46 %   MCV 89.8 80.0 - 100.0 fL   MCH 26.1 26.0 - 34.0 pg   MCHC 29.0 (L) 30.0 - 36.0 g/dL   RDW 15.9 (H) 11.5 - 15.5 %   Platelets 399 150 - 400 K/uL   nRBC 0.0 0.0 - 0.2 %    Comment: Performed at [facility], [address] [place],  [address]  Glucose, capillary     Status: Abnormal   Collection Time: 07/28/19  8:17 AM  Result Value Ref Range   Glucose-Capillary 148 (H) 70 - 99 mg/dL    Comment: Glucose reference range applies only to samples taken after fasting for at least 8 hours.  Glucose, capillary     Status: Abnormal   Collection Time: 07/28/19 11:43 AM  Result Value Ref Range   Glucose-Capillary 257 (H) 70 - 99 mg/dL    Comment: Glucose reference range applies only to samples taken after fasting for at least 8 hours.   ECHOCARDIOGRAM COMPLETE     Status: None   Collection Time: 07/28/19  1:14 PM  Result Value Ref Range  Weight 3,086.44 oz   Height 64 in   BP 110/56 mmHg  Glucose, capillary     Status: None   Collection Time: 07/28/19  5:08 PM  Result Value Ref Range   Glucose-Capillary 88 70 - 99 mg/dL    Comment: Glucose reference range applies only to samples taken after fasting for at least 8 hours.  Glucose, capillary     Status: Abnormal   Collection Time: 07/28/19  9:21 PM  Result Value Ref Range   Glucose-Capillary 193 (H) 70 - 99 mg/dL    Comment: Glucose reference range applies only to samples taken after fasting for at least 8 hours.  Glucose, capillary     Status: Abnormal   Collection Time: 07/29/19  3:23 AM  Result Value Ref Range   Glucose-Capillary 149 (H) 70 - 99 mg/dL    Comment: Glucose reference range applies only to samples taken after fasting for at least 8 hours.  Basic metabolic panel     Status: Abnormal   Collection Time: 07/29/19  6:24 AM  Result Value Ref Range   Sodium 135 135 - 145 mmol/L   Potassium 3.8 3.5 - 5.1 mmol/L    Comment: DELTA CHECK NOTED   Chloride 94 (L) 98 - 111 mmol/L   CO2 30 22 - 32 mmol/L   Glucose, Bld 147 (H) 70 - 99 mg/dL    Comment: Glucose reference range applies only to samples taken after fasting for at least 8 hours.   BUN 47 (H) 8 - 23 mg/dL   Creatinine, Ser 2.51 (H) 0.44 - 1.00 mg/dL   Calcium 9.4 8.9 - 10.3 mg/dL   GFR calc non Af Amer 17 (L) >60 mL/min   GFR calc Af Amer 20 (L) >60 mL/min   Anion gap 11 5 - 15    Comment: Performed at [facility], [address] [place], [place] [address]  CBC     Status: Abnormal   Collection Time: 07/29/19  6:24 AM  Result Value Ref Range   WBC 7.7 4.0 - 10.5 K/uL   RBC 3.01 (L) 3.87 - 5.11 MIL/uL   Hemoglobin 7.6 (L) 12.0 - 15.0 g/dL   HCT 26.6 (L) 36 - 46 %   MCV 88.4 80.0 - 100.0 fL   MCH 25.2 (L) 26.0 - 34.0 pg   MCHC 28.6 (L) 30.0 - 36.0 g/dL   RDW 15.6 (H) 11.5 - 15.5 %    Platelets 409 (H) 150 - 400 K/uL   nRBC 0.0 0.0 - 0.2 %    Comment: Performed at [facility], [address], [place], [place] [address]  Glucose, capillary     Status: Abnormal   Collection Time: 07/29/19  7:58 AM  Result Value Ref Range   Glucose-Capillary 127 (H) 70 - 99 mg/dL    Comment: Glucose reference range applies only to samples taken after fasting for at least 8 hours.  Glucose, capillary     Status: Abnormal   Collection Time: 07/29/19 11:31 AM  Result Value Ref Range   Glucose-Capillary 232 (H) 70 - 99 mg/dL    Comment: Glucose reference range applies only to samples taken after fasting for at least 8 hours.  Glucose, capillary     Status: Abnormal   Collection Time: 07/29/19  4:46 PM  Result Value Ref Range   Glucose-Capillary 123 (H) 70 - 99 mg/dL    Comment: Glucose reference range applies only to samples taken after fasting for at least 8 hours.  Glucose,  capillary     Status: Abnormal   Collection Time: 07/29/19  9:24 PM  Result Value Ref Range   Glucose-Capillary 232 (H) 70 - 99 mg/dL    Comment: Glucose reference range applies only to samples taken after fasting for at least 8 hours.  Glucose, capillary     Status: Abnormal   Collection Time: 07/30/19  3:14 AM  Result Value Ref Range   Glucose-Capillary 121 (H) 70 - 99 mg/dL    Comment: Glucose reference range applies only to samples taken after fasting for at least 8 hours.  CBC     Status: Abnormal   Collection Time: 07/30/19  5:21 AM  Result Value Ref Range   WBC 8.3 4.0 - 10.5 K/uL   RBC 3.07 (L) 3.87 - 5.11 MIL/uL   Hemoglobin 7.8 (L) 12.0 - 15.0 g/dL   HCT 26.8 (L) 36 - 46 %   MCV 87.3 80.0 - 100.0 fL   MCH 25.4 (L) 26.0 - 34.0 pg   MCHC 29.1 (L) 30.0 - 36.0 g/dL   RDW 15.4 11.5 - 15.5 %   Platelets 437 (H) 150 - 400 K/uL   nRBC 0.0 0.0 - 0.2 %    Comment: Performed at [facility], [address] [place], [place] [address]  Basic metabolic panel     Status: Abnormal   Collection Time: 07/30/19   5:21 AM  Result Value Ref Range   Sodium 135 135 - 145 mmol/L   Potassium 3.6 3.5 - 5.1 mmol/L   Chloride 94 (L) 98 - 111 mmol/L   CO2 29 22 - 32 mmol/L   Glucose, Bld 154 (H) 70 - 99 mg/dL    Comment: Glucose reference range applies only to samples taken after fasting for at least 8 hours.   BUN 46 (H) 8 - 23 mg/dL   Creatinine, Ser 2.42 (H) 0.44 - 1.00 mg/dL   Calcium 9.1 8.9 - 10.3 mg/dL   GFR calc non Af Amer 18 (L) >60 mL/min   GFR calc Af Amer 20 (L) >60 mL/min   Anion gap 12 5 - 15    Comment: Performed at [facility], [address] [place], [place] [address]  Brain natriuretic peptide     Status: None   Collection Time: 07/30/19  5:21 AM  Result Value Ref Range   B Natriuretic Peptide 92.0 0.0 - 100.0 pg/mL    Comment: Performed at [facility], [address], [place], [place] [address]  Magnesium     Status: None   Collection Time: 07/30/19  5:21 AM  Result Value Ref Range   Magnesium 1.8 1.7 - 2.4 mg/dL    Comment: Performed at [facility], [address] [place],  [address]  Glucose, capillary     Status: Abnormal   Collection Time: 07/30/19  7:57 AM  Result Value Ref Range   Glucose-Capillary 155 (H) 70 - 99 mg/dL    Comment: Glucose reference range applies only to samples taken after fasting for at least 8 hours.  Glucose, capillary     Status: Abnormal   Collection Time: 07/30/19 11:32 AM  Result Value Ref Range   Glucose-Capillary 288 (H) 70 - 99 mg/dL    Comment: Glucose reference range applies only to samples taken after fasting for at least 8 hours.  Glucose, capillary     Status: Abnormal   Collection Time: 07/30/19  4:57 PM  Result Value Ref Range   Glucose-Capillary 214 (H) 70 - 99 mg/dL  Comment: Glucose reference range applies only to samples taken after fasting for at least 8 hours.  Glucose, capillary     Status: Abnormal   Collection Time: 07/30/19  9:32 PM  Result Value Ref Range   Glucose-Capillary <10 (LL) 70 - 99 mg/dL    Comment:  Glucose reference range applies only to samples taken after fasting for at least 8 hours.   Comment 1 Notify RN    Comment 2 Call MD NNP PA CNM   Glucose, capillary     Status: Abnormal   Collection Time: 07/30/19  9:37 PM  Result Value Ref Range   Glucose-Capillary 438 (H) 70 - 99 mg/dL    Comment: Glucose reference range applies only to samples taken after fasting for at least 8 hours.  Glucose, capillary     Status: Abnormal   Collection Time: 07/30/19  9:39 PM  Result Value Ref Range   Glucose-Capillary 231 (H) 70 - 99 mg/dL    Comment: Glucose reference range applies only to samples taken after fasting for at least 8 hours.  Glucose, capillary     Status: Abnormal   Collection Time: 07/30/19  9:51 PM  Result Value Ref Range   Glucose-Capillary 135 (H) 70 - 99 mg/dL    Comment: Glucose reference range applies only to samples taken after fasting for at least 8 hours.  Glucose, capillary     Status: Abnormal   Collection Time: 07/30/19 10:45 PM  Result Value Ref Range   Glucose-Capillary 36 (LL) 70 - 99 mg/dL    Comment: Glucose reference range applies only to samples taken after fasting for at least 8 hours.  Glucose, capillary     Status: Abnormal   Collection Time: 07/30/19 11:13 PM  Result Value Ref Range   Glucose-Capillary 110 (H) 70 - 99 mg/dL    Comment: Glucose reference range applies only to samples taken after fasting for at least 8 hours.  Glucose, capillary     Status: None   Collection Time: 07/30/19 11:27 PM  Result Value Ref Range   Glucose-Capillary 81 70 - 99 mg/dL    Comment: Glucose reference range applies only to samples taken after fasting for at least 8 hours.  Glucose, capillary     Status: Abnormal   Collection Time: 07/30/19 11:47 PM  Result Value Ref Range   Glucose-Capillary 57 (L) 70 - 99 mg/dL    Comment: Glucose reference range applies only to samples taken after fasting for at least 8 hours.  Glucose, capillary     Status: Abnormal   Collection  Time: 07/31/19 12:27 AM  Result Value Ref Range   Glucose-Capillary 36 (LL) 70 - 99 mg/dL    Comment: Glucose reference range applies only to samples taken after fasting for at least 8 hours.   Comment 1 Notify RN   Glucose, capillary     Status: Abnormal   Collection Time: 07/31/19 12:40 AM  Result Value Ref Range   Glucose-Capillary 24 (LL) 70 - 99 mg/dL    Comment: Glucose reference range applies only to samples taken after fasting for at least 8 hours.  Glucose, capillary     Status: Abnormal   Collection Time: 07/31/19 12:53 AM  Result Value Ref Range   Glucose-Capillary 174 (H) 70 - 99 mg/dL    Comment: Glucose reference range applies only to samples taken after fasting for at least 8 hours.  Glucose, capillary     Status: Abnormal   Collection Time: 07/31/19  1:57  AM  Result Value Ref Range   Glucose-Capillary 50 (L) 70 - 99 mg/dL    Comment: Glucose reference range applies only to samples taken after fasting for at least 8 hours.  MRSA PCR Screening     Status: Abnormal   Collection Time: 07/31/19  2:31 AM   Specimen: Nasal Mucosa; Nasopharyngeal  Result Value Ref Range   MRSA by PCR POSITIVE (A) NEGATIVE    Comment:        The GeneXpert MRSA Assay (FDA approved for NASAL specimens only), is one component of a comprehensive MRSA colonization surveillance program. It is not intended to diagnose MRSA infection nor to guide or monitor treatment for MRSA infections. RESULT CALLED TO, READ BACK BY AND VERIFIED WITH: MURPHY @ 0909 ON [id number] BY HENDERSON L. Performed at [facility], [address] [place], [place] [address]   Glucose, capillary     Status: Abnormal   Collection Time: 07/31/19  2:39 AM  Result Value Ref Range   Glucose-Capillary 111 (H) 70 - 99 mg/dL    Comment: Glucose reference range applies only to samples taken after fasting for at least 8 hours.  Glucose, capillary     Status: None   Collection Time: 07/31/19  3:19 AM  Result Value Ref Range    Glucose-Capillary 78 70 - 99 mg/dL    Comment: Glucose reference range applies only to samples taken after fasting for at least 8 hours.  Glucose, capillary     Status: Abnormal   Collection Time: 07/31/19  3:52 AM  Result Value Ref Range   Glucose-Capillary 54 (L) 70 - 99 mg/dL    Comment: Glucose reference range applies only to samples taken after fasting for at least 8 hours.  CBC     Status: Abnormal   Collection Time: 07/31/19  4:05 AM  Result Value Ref Range   WBC 8.3 4.0 - 10.5 K/uL   RBC 2.91 (L) 3.87 - 5.11 MIL/uL   Hemoglobin 7.3 (L) 12.0 - 15.0 g/dL   HCT 25.3 (L) 36 - 46 %   MCV 86.9 80.0 - 100.0 fL   MCH 25.1 (L) 26.0 - 34.0 pg   MCHC 28.9 (L) 30.0 - 36.0 g/dL   RDW 15.0 11.5 - 15.5 %   Platelets 418 (H) 150 - 400 K/uL   nRBC 0.0 0.0 - 0.2 %    Comment: Performed at [facility], [address], [place], [place] [address]  Basic metabolic panel     Status: Abnormal   Collection Time: 07/31/19  4:05 AM  Result Value Ref Range   Sodium 135 135 - 145 mmol/L   Potassium 2.7 (LL) 3.5 - 5.1 mmol/L    Comment: CRITICAL RESULT CALLED TO, READ BACK BY AND VERIFIED WITH: FENSKE @ 0649 ON [id number] BY HENDERSON L    Chloride 93 (L) 98 - 111 mmol/L   CO2 31 22 - 32 mmol/L   Glucose, Bld 199 (H) 70 - 99 mg/dL    Comment: Glucose reference range applies only to samples taken after fasting for at least 8 hours.   BUN 51 (H) 8 - 23 mg/dL   Creatinine, Ser 2.55 (H) 0.44 - 1.00 mg/dL   Calcium 9.0 8.9 - 10.3 mg/dL   GFR calc non Af Amer 17 (L) >60 mL/min   GFR calc Af Amer 19 (L) >60 mL/min   Anion gap 11 5 - 15    Comment: Performed at [facility], [address], [place], [place] [address]  Magnesium     Status: None   Collection Time: 07/31/19  4:05 AM  Result Value Ref Range   Magnesium 1.7 1.7 - 2.4 mg/dL    Comment: Performed at [facility], [address] [place], [place] [address]  Vitamin B12     Status: None   Collection Time: 07/31/19  4:05 AM  Result Value Ref Range    Vitamin B-12 577 180 - 914 pg/mL    Comment: (NOTE) This assay is not validated for testing neonatal or myeloproliferative syndrome specimens for Vitamin B12 levels. Performed at [facility], [address], [place], [place] [address]   Folate     Status: None   Collection Time: 07/31/19  4:05 AM  Result Value Ref Range   Folate 8.0 >5.9 ng/mL    Comment: Performed at [facility], [address], [place], [place] [address]  TSH     Status: None   Collection Time: 07/31/19  4:05 AM  Result Value Ref Range   TSH 3.105 0.350 - 4.500 uIU/mL    Comment: Performed by a 3rd Generation assay with a functional sensitivity of <=0.01 uIU/mL. Performed at [facility], [address] [place], [place] [address]   T4, free     Status: None   Collection Time: 07/31/19  4:05 AM  Result Value Ref Range   Free T4 0.97 0.61 - 1.12 ng/dL    Comment: (NOTE) Biotin ingestion may interfere with free T4 tests. If the results are inconsistent with the TSH level, previous test results, or the clinical presentation, then consider biotin interference. If needed, order repeat testing after stopping biotin. Performed at [facility], [place] [address] [place], [place] [address]   Iron and TIBC     Status: Abnormal   Collection Time: 07/31/19  4:05 AM  Result Value Ref Range   Iron 106 28 - 170 ug/dL   TIBC 331 250 - 450 ug/dL   Saturation Ratios 32 (H) 10.4 - 31.8 %   UIBC 225 ug/dL    Comment: Performed at [facility], [address], [place], [place] [address]  Ferritin     Status: None   Collection Time: 07/31/19  4:05 AM  Result Value Ref Range   Ferritin 36 11 - 307 ng/mL    Comment: Performed at [facility], [address], [place],  [address]  Glucose, capillary     Status: Abnormal   Collection Time: 07/31/19  4:20 AM  Result Value Ref Range   Glucose-Capillary 167 (H) 70 - 99 mg/dL    Comment: Glucose reference range applies only to samples taken after fasting for at least 8  hours.  Glucose, capillary     Status: None   Collection Time: 07/31/19  5:16 AM  Result Value Ref Range   Glucose-Capillary 86 70 - 99 mg/dL    Comment: Glucose reference range applies only to samples taken after fasting for at least 8 hours.  Glucose, capillary     Status: Abnormal   Collection Time: 07/31/19  6:04 AM  Result Value Ref Range   Glucose-Capillary 59 (L) 70 - 99 mg/dL    Comment: Glucose reference range applies only to samples taken after fasting for at least 8 hours.  Glucose, capillary     Status: Abnormal   Collection Time: 07/31/19  6:49 AM  Result Value Ref Range   Glucose-Capillary 145 (H) 70 - 99 mg/dL    Comment: Glucose reference range applies only to samples taken  after fasting for at least 8 hours.  Glucose, capillary     Status: Abnormal   Collection Time: 07/31/19  7:22 AM  Result Value Ref Range   Glucose-Capillary 130 (H) 70 - 99 mg/dL    Comment: Glucose reference range applies only to samples taken after fasting for at least 8 hours.  Glucose, capillary     Status: Abnormal   Collection Time: 07/31/19  8:18 AM  Result Value Ref Range   Glucose-Capillary 117 (H) 70 - 99 mg/dL    Comment: Glucose reference range applies only to samples taken after fasting for at least 8 hours.  Glucose, capillary     Status: Abnormal   Collection Time: 07/31/19 11:12 AM  Result Value Ref Range   Glucose-Capillary 181 (H) 70 - 99 mg/dL    Comment: Glucose reference range applies only to samples taken after fasting for at least 8 hours.  Occult blood card to lab, stool RN will collect     Status: None   Collection Time: 07/31/19  4:46 PM  Result Value Ref Range   Fecal Occult Bld NEGATIVE NEGATIVE    Comment: Performed at [facility], [address] [place], [place] [address]  Glucose, capillary     Status: Abnormal   Collection Time: 07/31/19  5:09 PM  Result Value Ref Range   Glucose-Capillary 280 (H) 70 - 99 mg/dL    Comment: Glucose reference range applies  only to samples taken after fasting for at least 8 hours.  Glucose, capillary     Status: Abnormal   Collection Time: 07/31/19  9:27 PM  Result Value Ref Range   Glucose-Capillary 400 (H) 70 - 99 mg/dL    Comment: Glucose reference range applies only to samples taken after fasting for at least 8 hours.   Comment 1 Notify RN    Comment 2 Document in Chart   Glucose, capillary     Status: Abnormal   Collection Time: 07/31/19 10:36 PM  Result Value Ref Range   Glucose-Capillary 413 (H) 70 - 99 mg/dL    Comment: Glucose reference range applies only to samples taken after fasting for at least 8 hours.  Glucose, random     Status: Abnormal   Collection Time: 07/31/19 11:05 PM  Result Value Ref Range   Glucose, Bld 395 (H) 70 - 99 mg/dL    Comment: Glucose reference range applies only to samples taken after fasting for at least 8 hours. Performed at [facility], [address] [place], [place] [address]   Glucose, capillary     Status: Abnormal   Collection Time: 08/01/19 12:33 AM  Result Value Ref Range   Glucose-Capillary 320 (H) 70 - 99 mg/dL    Comment: Glucose reference range applies only to samples taken after fasting for at least 8 hours.  Glucose, capillary     Status: Abnormal   Collection Time: 08/01/19  2:47 AM  Result Value Ref Range   Glucose-Capillary 279 (H) 70 - 99 mg/dL    Comment: Glucose reference range applies only to samples taken after fasting for at least 8 hours.  Basic metabolic panel     Status: Abnormal   Collection Time: 08/01/19  2:59 AM  Result Value Ref Range   Sodium 133 (L) 135 - 145 mmol/L   Potassium 4.8 3.5 - 5.1 mmol/L    Comment: DELTA CHECK NOTED   Chloride 94 (L) 98 - 111 mmol/L   CO2 32 22 - 32 mmol/L   Glucose,  Bld 277 (H) 70 - 99 mg/dL    Comment: Glucose reference range applies only to samples taken after fasting for at least 8 hours.   BUN 46 (H) 8 - 23 mg/dL   Creatinine, Ser 2.73 (H) 0.44 - 1.00 mg/dL   Calcium 9.0 8.9 - 10.3 mg/dL    GFR calc non Af Amer 15 (L) >60 mL/min   GFR calc Af Amer 18 (L) >60 mL/min   Anion gap 7 5 - 15    Comment: Performed at [facility], [address] [place], [place] [address]  CBC     Status: Abnormal   Collection Time: 08/01/19  2:59 AM  Result Value Ref Range   WBC 9.2 4.0 - 10.5 K/uL   RBC 2.90 (L) 3.87 - 5.11 MIL/uL   Hemoglobin 7.2 (L) 12.0 - 15.0 g/dL   HCT 25.1 (L) 36 - 46 %   MCV 86.6 80.0 - 100.0 fL   MCH 24.8 (L) 26.0 - 34.0 pg   MCHC 28.7 (L) 30.0 - 36.0 g/dL   RDW 15.2 11.5 - 15.5 %   Platelets 455 (H) 150 - 400 K/uL   nRBC 0.0 0.0 - 0.2 %    Comment: Performed at [facility], [address] [place], [place] [address]  Magnesium     Status: Abnormal   Collection Time: 08/01/19  2:59 AM  Result Value Ref Range   Magnesium 2.5 (H) 1.7 - 2.4 mg/dL    Comment: Performed at [facility], [address], [place], [place] [address]  Cortisol-am, blood     Status: None   Collection Time: 08/01/19  2:59 AM  Result Value Ref Range   Cortisol - AM 11.9 6.7 - 22.6 ug/dL    Comment: Performed at [facility] [address] [place], [place] [address]  Glucose, capillary     Status: Abnormal   Collection Time: 08/01/19  5:24 AM  Result Value Ref Range   Glucose-Capillary 207 (H) 70 - 99 mg/dL    Comment: Glucose reference range applies only to samples taken after fasting for at least 8 hours.  Glucose, capillary     Status: Abnormal   Collection Time: 08/01/19  7:29 AM  Result Value Ref Range   Glucose-Capillary 181 (H) 70 - 99 mg/dL    Comment: Glucose reference range applies only to samples taken after fasting for at least 8 hours.  Glucose, capillary     Status: Abnormal   Collection Time: 08/01/19 11:06 AM  Result Value Ref Range   Glucose-Capillary 369 (H) 70 - 99 mg/dL    Comment: Glucose reference range applies only to samples taken after fasting for at least 8 hours.  Procalcitonin - Baseline     Status: None   Collection Time: 08/01/19  2:51 PM  Result  Value Ref Range   Procalcitonin 0.70 ng/mL    Comment:        Interpretation: PCT > 0.5 ng/mL and <= 2 ng/mL: Systemic infection (sepsis) is possible, but other conditions are known to elevate PCT as well. (NOTE)       Sepsis PCT Algorithm           Lower Respiratory Tract                                      Infection PCT Algorithm    ----------------------------     ----------------------------  PCT < 0.25 ng/mL                PCT < 0.10 ng/mL         Strongly encourage             Strongly discourage   discontinuation of antibiotics    initiation of antibiotics    ----------------------------     -----------------------------       PCT 0.25 - 0.50 ng/mL            PCT 0.10 - 0.25 ng/mL               OR       >80% decrease in PCT            Discourage initiation of                                            antibiotics      Encourage discontinuation           of antibiotics    ----------------------------     -----------------------------         PCT >= 0.50 ng/mL              PCT 0.26 - 0.50 ng/mL                AND       <80% decrease in PCT             Encourage initiation of                                             antibiotics       Encourage continuation           of antibiotics    ----------------------------     -----------------------------        PCT >= 0.50 ng/mL                  PCT > 0.50 ng/mL               AND         increase in PCT                  Strongly encourage                                      initiation of antibiotics    Strongly encourage escalation           of antibiotics                                     -----------------------------                                           PCT <= 0.25 ng/mL  OR                                        > 80% decrease in PCT                                     Discontinue / Do not initiate                                              antibiotics Performed at [facility], [address] [place], [place] [address]   Glucose, capillary     Status: Abnormal   Collection Time: 08/01/19  3:39 PM  Result Value Ref Range   Glucose-Capillary 319 (H) 70 - 99 mg/dL    Comment: Glucose reference range applies only to samples taken after fasting for at least 8 hours.   Comment 1 Notify RN    Comment 2 Document in Chart   Glucose, capillary     Status: Abnormal   Collection Time: 08/01/19  9:27 PM  Result Value Ref Range   Glucose-Capillary 351 (H) 70 - 99 mg/dL    Comment: Glucose reference range applies only to samples taken after fasting for at least 8 hours.  Glucose, capillary     Status: Abnormal   Collection Time: 08/02/19  3:00 AM  Result Value Ref Range   Glucose-Capillary 272 (H) 70 - 99 mg/dL    Comment: Glucose reference range applies only to samples taken after fasting for at least 8 hours.   Comment 1 Notify RN    Comment 2 Document in Chart   Brain natriuretic peptide     Status: Abnormal   Collection Time: 08/02/19  3:38 AM  Result Value Ref Range   B Natriuretic Peptide 137.0 (H) 0.0 - 100.0 pg/mL    Comment: Performed at [facility], [address], [place], [place] [address]  CBC     Status: Abnormal   Collection Time: 08/02/19  3:38 AM  Result Value Ref Range   WBC 9.7 4.0 - 10.5 K/uL   RBC 2.96 (L) 3.87 - 5.11 MIL/uL   Hemoglobin 7.5 (L) 12.0 - 15.0 g/dL   HCT 26.4 (L) 36 - 46 %   MCV 89.2 80.0 - 100.0 fL   MCH 25.3 (L) 26.0 - 34.0 pg   MCHC 28.4 (L) 30.0 - 36.0 g/dL   RDW 15.3 11.5 - 15.5 %   Platelets 467 (H) 150 - 400 K/uL   nRBC 0.0 0.0 - 0.2 %    Comment: Performed at [facility], [address], [place], [place] [address]  Basic metabolic panel     Status: Abnormal   Collection Time: 08/02/19  3:38 AM  Result Value Ref Range   Sodium 133 (L) 135 - 145 mmol/L   Potassium 4.9 3.5 - 5.1 mmol/L   Chloride 94 (L) 98 - 111 mmol/L   CO2 29 22 - 32 mmol/L   Glucose, Bld 260 (H) 70 - 99 mg/dL     Comment: Glucose reference range applies only to samples taken after fasting for at least 8 hours.   BUN 50 (H) 8 - 23 mg/dL   Creatinine, Ser 2.84 (H) 0.44 - 1.00  mg/dL   Calcium 9.2 8.9 - 10.3 mg/dL   GFR calc non Af Amer 15 (L) >60 mL/min   GFR calc Af Amer 17 (L) >60 mL/min   Anion gap 10 5 - 15    Comment: Performed at [facility], [address] [place], [place] [address]  Glucose, capillary     Status: Abnormal   Collection Time: 08/02/19  7:58 AM  Result Value Ref Range   Glucose-Capillary 260 (H) 70 - 99 mg/dL    Comment: Glucose reference range applies only to samples taken after fasting for at least 8 hours.  Glucose, capillary     Status: Abnormal   Collection Time: 08/02/19 11:19 AM  Result Value Ref Range   Glucose-Capillary 428 (H) 70 - 99 mg/dL    Comment: Glucose reference range applies only to samples taken after fasting for at least 8 hours.  Glucose, capillary     Status: Abnormal   Collection Time: 08/02/19 12:34 PM  Result Value Ref Range   Glucose-Capillary 362 (H) 70 - 99 mg/dL    Comment: Glucose reference range applies only to samples taken after fasting for at least 8 hours.  Glucose, capillary     Status: Abnormal   Collection Time: 08/02/19  3:57 PM  Result Value Ref Range   Glucose-Capillary 324 (H) 70 - 99 mg/dL    Comment: Glucose reference range applies only to samples taken after fasting for at least 8 hours.  Glucose, capillary     Status: Abnormal   Collection Time: 08/02/19  8:47 PM  Result Value Ref Range   Glucose-Capillary 396 (H) 70 - 99 mg/dL    Comment: Glucose reference range applies only to samples taken after fasting for at least 8 hours.  Glucose, capillary     Status: Abnormal   Collection Time: 08/03/19 12:06 AM  Result Value Ref Range   Glucose-Capillary 292 (H) 70 - 99 mg/dL    Comment: Glucose reference range applies only to samples taken after fasting for at least 8 hours.   Comment 1 Notify RN    Comment 2 Document in  Chart   Glucose, capillary     Status: Abnormal   Collection Time: 08/03/19  3:41 AM  Result Value Ref Range   Glucose-Capillary 239 (H) 70 - 99 mg/dL    Comment: Glucose reference range applies only to samples taken after fasting for at least 8 hours.  Basic metabolic panel     Status: Abnormal   Collection Time: 08/03/19  6:07 AM  Result Value Ref Range   Sodium 134 (L) 135 - 145 mmol/L   Potassium 4.4 3.5 - 5.1 mmol/L   Chloride 93 (L) 98 - 111 mmol/L   CO2 31 22 - 32 mmol/L   Glucose, Bld 237 (H) 70 - 99 mg/dL    Comment: Glucose reference range applies only to samples taken after fasting for at least 8 hours.   BUN 51 (H) 8 - 23 mg/dL   Creatinine, Ser 2.81 (H) 0.44 - 1.00 mg/dL   Calcium 9.1 8.9 - 10.3 mg/dL   GFR calc non Af Amer 15 (L) >60 mL/min   GFR calc Af Amer 17 (L) >60 mL/min   Anion gap 10 5 - 15    Comment: Performed at [facility], [address] [place], [place] [address]  Magnesium     Status: None   Collection Time: 08/03/19  6:07 AM  Result Value Ref Range   Magnesium 2.2 1.7 -  2.4 mg/dL    Comment: Performed at [facility], [address] [place], [place] [address]  Glucose, capillary     Status: Abnormal   Collection Time: 08/03/19  7:13 AM  Result Value Ref Range   Glucose-Capillary 227 (H) 70 - 99 mg/dL    Comment: Glucose reference range applies only to samples taken after fasting for at least 8 hours.  SARS Coronavirus 2 by RT PCR (hospital order, performed in [facility] hospital lab) Nasopharyngeal Nasopharyngeal Swab     Status: None   Collection Time: 08/03/19 10:48 AM   Specimen: Nasopharyngeal Swab  Result Value Ref Range   SARS Coronavirus 2 NEGATIVE NEGATIVE    Comment: (NOTE) SARS-CoV-2 target nucleic acids are NOT DETECTED. The SARS-CoV-2 RNA is generally detectable in upper and lower respiratory specimens during the acute phase of infection. The lowest concentration of SARS-CoV-2 viral copies this assay can detect is 250 copies / mL.  A negative result does not preclude SARS-CoV-2 infection and should not be used as the sole basis for treatment or other patient management decisions.  A negative result may occur with improper specimen collection / handling, submission of specimen other than nasopharyngeal swab, presence of viral mutation(s) within the areas targeted by this assay, and inadequate number of viral copies (<250 copies / mL). A negative result must be combined with clinical observations, patient history, and epidemiological information. Fact Sheet for Patients:   StrictlyIdeas.no Fact Sheet for Healthcare Providers: BankingDealers.co.za This test is not yet approved or cleared  by the [place] FDA and has been authorized for detection and/or diagnosis of SARS-CoV-2 by FDA under an Emergency Use Authorization (EUA).  This EUA will remain in effect (meaning this test can be used) for the duration of the COVID-19 declaration under Section 564(b)(1) of the Act, 21 U.S.C. section 360bbb-3(b)(1), unless the authorization is terminated or revoked sooner. Performed at [facility], [address] [place], [place] [address]   Glucose, capillary     Status: Abnormal   Collection Time: 08/03/19 11:56 AM  Result Value Ref Range   Glucose-Capillary 293 (H) 70 - 99 mg/dL    Comment: Glucose reference range applies only to samples taken after fasting for at least 8 hours.  Basic metabolic panel     Status: Abnormal   Collection Time: 08/09/19  7:44 AM  Result Value Ref Range   Sodium 132 (L) 135 - 145 mmol/L   Potassium 4.3 3.5 - 5.1 mmol/L   Chloride 90 (L) 98 - 111 mmol/L   CO2 34 (H) 22 - 32 mmol/L   Glucose, Bld 300 (H) 70 - 99 mg/dL    Comment: Glucose reference range applies only to samples taken after fasting for at least 8 hours.   BUN 48 (H) 8 - 23 mg/dL   Creatinine, Ser 2.95 (H) 0.44 - 1.00 mg/dL   Calcium 9.3 8.9 - 10.3 mg/dL   GFR calc non Af Amer 14  (L) >60 mL/min   GFR calc Af Amer 16 (L) >60 mL/min   Anion gap 8 5 - 15    Comment: Performed at [facility], [address] [place], [place] [address]  Hemoglobin and hematocrit, blood     Status: Abnormal   Collection Time: 08/09/19  7:44 AM  Result Value Ref Range   Hemoglobin 7.2 (L) 12.0 - 15.0 g/dL   HCT 24.8 (L) 36 - 46 %    Comment: Performed at [facility], [address], [place], [place] [address]  Basic metabolic panel     Status: Abnormal   Collection Time: 08/17/19  8:27 AM  Result Value Ref Range   Sodium 133 (L) 135 - 145 mmol/L   Potassium 3.5 3.5 - 5.1 mmol/L   Chloride 91 (L) 98 - 111 mmol/L   CO2 31 22 - 32 mmol/L   Glucose, Bld 187 (H) 70 - 99 mg/dL    Comment: Glucose reference range applies only to samples taken after fasting for at least 8 hours.   BUN 43 (H) 8 - 23 mg/dL   Creatinine, Ser 2.71 (H) 0.44 - 1.00 mg/dL   Calcium 9.4 8.9 - 10.3 mg/dL   GFR calc non Af Amer 15 (L) >60 mL/min   GFR calc Af Amer 18 (L) >60 mL/min   Anion gap 11 5 - 15    Comment: Performed at [facility], [address] [place], [place] [address]  Basic metabolic panel     Status: Abnormal   Collection Time: 08/24/19  8:10 AM  Result Value Ref Range   Sodium 135 135 - 145 mmol/L   Potassium 4.1 3.5 - 5.1 mmol/L   Chloride 92 (L) 98 - 111 mmol/L   CO2 31 22 - 32 mmol/L   Glucose, Bld 147 (H) 70 - 99 mg/dL    Comment: Glucose reference range applies only to samples taken after fasting for at least 8 hours.   BUN 38 (H) 8 - 23 mg/dL   Creatinine, Ser 2.71 (H) 0.44 - 1.00 mg/dL   Calcium 9.4 8.9 - 10.3 mg/dL   GFR calc non Af Amer 15 (L) >60 mL/min   GFR calc Af Amer 18 (L) >60 mL/min   Anion gap 12 5 - 15    Comment: Performed at [facility], [address], [place], [place] [address]  Basic metabolic panel     Status: Abnormal   Collection Time: 08/26/19  9:04 AM  Result Value Ref Range   Sodium 134 (L) 135 - 145 mmol/L   Potassium 4.1 3.5 - 5.1 mmol/L   Chloride 91  (L) 98 - 111 mmol/L   CO2 33 (H) 22 - 32 mmol/L   Glucose, Bld 153 (H) 70 - 99 mg/dL    Comment: Glucose reference range applies only to samples taken after fasting for at least 8 hours.   BUN 45 (H) 8 - 23 mg/dL   Creatinine, Ser 2.95 (H) 0.44 - 1.00 mg/dL   Calcium 9.6 8.9 - 10.3 mg/dL   GFR calc non Af Amer 14 (L) >60 mL/min   GFR calc Af Amer 16 (L) >60 mL/min   Anion gap 10 5 - 15    Comment: Performed at [facility], [address] [place], [place] [address]  Type and screen [facility]     Status: None   Collection Time: 09/09/19  7:16 PM  Result Value Ref Range   ABO/RH(D) O POS    Antibody Screen NEG    Sample Expiration 09/12/2019,2359    Unit Number [id number]    Blood Component Type RED CELLS,LR    Unit division 00    Status of Unit REL FROM [facility]    Transfusion Status OK TO TRANSFUSE    Crossmatch Result      Compatible Performed at [facility], [address] [place], [place] [address]    Unit Number [id number]    Blood Component Type RED CELLS,LR    Unit division 00    Status of Unit ISSUED,FINAL  Transfusion Status OK TO TRANSFUSE    Crossmatch Result Compatible   CBC with Differential     Status: Abnormal   Collection Time: 09/09/19  7:16 PM  Result Value Ref Range   WBC 7.5 4.0 - 10.5 K/uL   RBC 2.79 (L) 3.87 - 5.11 MIL/uL   Hemoglobin 6.7 (LL) 12.0 - 15.0 g/dL    Comment: This critical result has verified and been called to [facility] by Jesse Fall on 07 15 2021 at 2003, and has been read back.    HCT 24.2 (L) 36 - 46 %   MCV 86.7 80.0 - 100.0 fL   MCH 24.0 (L) 26.0 - 34.0 pg   MCHC 27.7 (L) 30.0 - 36.0 g/dL   RDW 15.6 (H) 11.5 - 15.5 %   Platelets 416 (H) 150 - 400 K/uL   nRBC 0.0 0.0 - 0.2 %   Neutrophils Relative % 75 %   Neutro Abs 5.6 1.7 - 7.7 K/uL   Lymphocytes Relative 9 %   Lymphs Abs 0.7 0.7 - 4.0 K/uL   Monocytes Relative 10 %   Monocytes Absolute 0.7 0 - 1 K/uL   Eosinophils Relative 5 %   Eosinophils Absolute 0.4 0 - 0  K/uL   Basophils Relative 0 %   Basophils Absolute 0.0 0 - 0 K/uL   Immature Granulocytes 1 %   Abs Immature Granulocytes 0.05 0.00 - 0.07 K/uL    Comment: Performed at [facility], [address], [place], [place] [address]  Basic metabolic panel     Status: Abnormal   Collection Time: 09/09/19  7:16 PM  Result Value Ref Range   Sodium 130 (L) 135 - 145 mmol/L   Potassium 3.9 3.5 - 5.1 mmol/L   Chloride 87 (L) 98 - 111 mmol/L   CO2 33 (H) 22 - 32 mmol/L   Glucose, Bld 408 (H) 70 - 99 mg/dL    Comment: Glucose reference range applies only to samples taken after fasting for at least 8 hours.   BUN 55 (H) 8 - 23 mg/dL   Creatinine, Ser 2.95 (H) 0.44 - 1.00 mg/dL   Calcium 9.2 8.9 - 10.3 mg/dL   GFR calc non Af Amer 14 (L) >60 mL/min   GFR calc Af Amer 16 (L) >60 mL/min   Anion gap 10 5 - 15    Comment: Performed at [facility], [address], [place], [place] [address]  Brain natriuretic peptide     Status: Abnormal   Collection Time: 09/09/19  7:16 PM  Result Value Ref Range   B Natriuretic Peptide 306.0 (H) 0.0 - 100.0 pg/mL    Comment: Performed at [facility], [address], [place], [place] [address]  Prepare RBC (crossmatch)     Status: None   Collection Time: 09/09/19  7:16 PM  Result Value Ref Range   Order Confirmation      ORDER PROCESSED BY BLOOD BANK Performed at [facility], [address] [place], [place] [address]   BPAM RBC     Status: None   Collection Time: 09/09/19  7:16 PM  Result Value Ref Range   Blood Product Unit Number [id number]    PRODUCT CODE [id number]    Unit Type and Rh 5100    Blood Product Expiration Date 382505397673    ISSUE DATE / TIME [id number]    Blood Product Unit Number [id number]    PRODUCT CODE [id number]    Unit Type and Rh 5100    Blood  Product Expiration Date 149702637858   Magnesium     Status: None   Collection Time: 09/09/19  7:16 PM  Result Value Ref Range   Magnesium 2.2 1.7 - 2.4 mg/dL    Comment: Performed at  [facility], [address], [place], [place] [address]  SARS Coronavirus 2 by RT PCR (hospital order, performed in [facility] hospital lab) Nasopharyngeal Nasopharyngeal Swab     Status: None   Collection Time: 09/09/19  8:28 PM   Specimen: Nasopharyngeal Swab  Result Value Ref Range   SARS Coronavirus 2 NEGATIVE NEGATIVE    Comment: (NOTE) SARS-CoV-2 target nucleic acids are NOT DETECTED.  The SARS-CoV-2 RNA is generally detectable in upper and lower respiratory specimens during the acute phase of infection. The lowest concentration of SARS-CoV-2 viral copies this assay can detect is 250 copies / mL. A negative result does not preclude SARS-CoV-2 infection and should not be used as the sole basis for treatment or other patient management decisions.  A negative result may occur with improper specimen collection / handling, submission of specimen other than nasopharyngeal swab, presence of viral mutation(s) within the areas targeted by this assay, and inadequate number of viral copies (<250 copies / mL). A negative result must be combined with clinical observations, patient history, and epidemiological information.  Fact Sheet for Patients:   StrictlyIdeas.no  Fact Sheet for Healthcare Providers: BankingDealers.co.za  This test is not yet approved or  cleared by the [place] FDA and has been authorized for detection and/or diagnosis of SARS-CoV-2 by FDA under an Emergency Use Authorization (EUA).  This EUA will remain in effect (meaning this test can be used) for the duration of the COVID-19 declaration under Section 564(b)(1) of the Act, 21 U.S.C. section 360bbb-3(b)(1), unless the authorization is terminated or revoked sooner.  Performed at [facility], [address] [place], [place] [address]   ABO/Rh     Status: None   Collection Time: 09/09/19  8:45 PM  Result Value Ref Range   ABO/RH(D)      O POS Performed at [facility], [address] [place], [place] [address]   Glucose, capillary     Status: Abnormal   Collection Time: 09/10/19 12:27 AM  Result Value Ref Range   Glucose-Capillary 291 (H) 70 - 99 mg/dL    Comment: Glucose reference range applies only to samples taken after fasting for at least 8 hours.  Basic metabolic panel     Status: Abnormal   Collection Time: 09/10/19  6:38 AM  Result Value Ref Range   Sodium 136 135 - 145 mmol/L   Potassium 3.5 3.5 - 5.1 mmol/L   Chloride 88 (L) 98 - 111 mmol/L   CO2 35 (H) 22 - 32 mmol/L   Glucose, Bld 257 (H) 70 - 99 mg/dL    Comment: Glucose reference range applies only to samples taken after fasting for at least 8 hours.   BUN 53 (H) 8 - 23 mg/dL   Creatinine, Ser 2.94 (H) 0.44 - 1.00 mg/dL   Calcium 9.8 8.9 - 10.3 mg/dL   GFR calc non Af Amer 14 (L) >60 mL/min   GFR calc Af Amer 16 (L) >60 mL/min   Anion gap 13 5 - 15    Comment: Performed at [facility], [address] [place],  [address]  CBC     Status: Abnormal   Collection Time: 09/10/19  6:38 AM  Result Value Ref Range   WBC 8.5 4.0 -  10.5 K/uL   RBC 3.18 (L) 3.87 - 5.11 MIL/uL   Hemoglobin 7.9 (L) 12.0 - 15.0 g/dL   HCT 63.9 (L) 36 - 46 %   MCV 86.8 80.0 - 100.0 fL   MCH 24.8 (L) 26.0 - 34.0 pg   MCHC 28.6 (L) 30.0 - 36.0 g/dL   RDW 20.2 (H) 25.7 - 69.8 %   Platelets 415 (H) 150 - 400 K/uL   nRBC 0.0 0.0 - 0.2 %    Comment: Performed at [facility], [address] [place], [place] [address]  Glucose, capillary     Status: Abnormal   Collection Time: 09/10/19  8:18 AM  Result Value Ref Range   Glucose-Capillary 244 (H) 70 - 99 mg/dL    Comment: Glucose reference range applies only to samples taken after fasting for at least 8 hours.  Glucose, capillary     Status: Abnormal   Collection Time: 09/10/19 11:49 AM  Result Value Ref Range   Glucose-Capillary 268 (H) 70 - 99 mg/dL    Comment: Glucose reference range applies only to samples taken after fasting for at least 8  hours.  Glucose, capillary     Status: Abnormal   Collection Time: 09/10/19  4:33 PM  Result Value Ref Range   Glucose-Capillary 134 (H) 70 - 99 mg/dL    Comment: Glucose reference range applies only to samples taken after fasting for at least 8 hours.   Comment 1 Notify RN    Comment 2 Document in Chart   Glucose, capillary     Status: Abnormal   Collection Time: 09/10/19  9:23 PM  Result Value Ref Range   Glucose-Capillary 250 (H) 70 - 99 mg/dL    Comment: Glucose reference range applies only to samples taken after fasting for at least 8 hours.  Glucose, capillary     Status: Abnormal   Collection Time: 09/11/19  7:25 AM  Result Value Ref Range   Glucose-Capillary 149 (H) 70 - 99 mg/dL    Comment: Glucose reference range applies only to samples taken after fasting for at least 8 hours.   Comment 1 Notify RN    Comment 2 Document in Chart   Glucose, capillary     Status: Abnormal   Collection Time: 09/11/19 11:18 AM  Result Value Ref Range   Glucose-Capillary 234 (H) 70 - 99 mg/dL    Comment: Glucose reference range applies only to samples taken after fasting for at least 8 hours.   Comment 1 Notify RN    Comment 2 Document in Chart   Glucose, capillary     Status: Abnormal   Collection Time: 09/11/19  4:42 PM  Result Value Ref Range   Glucose-Capillary 199 (H) 70 - 99 mg/dL    Comment: Glucose reference range applies only to samples taken after fasting for at least 8 hours.   Comment 1 Procedure Error   Glucose, capillary     Status: Abnormal   Collection Time: 09/11/19  9:12 PM  Result Value Ref Range   Glucose-Capillary 293 (H) 70 - 99 mg/dL    Comment: Glucose reference range applies only to samples taken after fasting for at least 8 hours.  Glucose, capillary     Status: Abnormal   Collection Time: 09/12/19  7:24 AM  Result Value Ref Range   Glucose-Capillary 199 (H) 70 - 99 mg/dL    Comment: Glucose reference range applies only to samples taken after fasting for at  least 8 hours.  CBC  Status: Abnormal   Collection Time: 09/12/19  8:09 AM  Result Value Ref Range   WBC 7.9 4.0 - 10.5 K/uL   RBC 3.30 (L) 3.87 - 5.11 MIL/uL   Hemoglobin 8.1 (L) 12.0 - 15.0 g/dL   HCT 28.5 (L) 36 - 46 %   MCV 86.4 80.0 - 100.0 fL   MCH 24.5 (L) 26.0 - 34.0 pg   MCHC 28.4 (L) 30.0 - 36.0 g/dL   RDW 15.8 (H) 11.5 - 15.5 %   Platelets 404 (H) 150 - 400 K/uL   nRBC 0.0 0.0 - 0.2 %    Comment: Performed at [facility], [address] [place], [place] [address]  Basic metabolic panel     Status: Abnormal   Collection Time: 09/12/19  8:09 AM  Result Value Ref Range   Sodium 136 135 - 145 mmol/L   Potassium 3.6 3.5 - 5.1 mmol/L   Chloride 88 (L) 98 - 111 mmol/L   CO2 37 (H) 22 - 32 mmol/L   Glucose, Bld 251 (H) 70 - 99 mg/dL    Comment: Glucose reference range applies only to samples taken after fasting for at least 8 hours.   BUN 47 (H) 8 - 23 mg/dL   Creatinine, Ser 3.05 (H) 0.44 - 1.00 mg/dL   Calcium 9.5 8.9 - 10.3 mg/dL   GFR calc non Af Amer 13 (L) >60 mL/min   GFR calc Af Amer 15 (L) >60 mL/min   Anion gap 11 5 - 15    Comment: Performed at [facility], [address] [place], [place] [address]  Glucose, capillary     Status: Abnormal   Collection Time: 09/12/19 11:12 AM  Result Value Ref Range   Glucose-Capillary 306 (H) 70 - 99 mg/dL    Comment: Glucose reference range applies only to samples taken after fasting for at least 8 hours.  Glucose, capillary     Status: Abnormal   Collection Time: 09/12/19  4:38 PM  Result Value Ref Range   Glucose-Capillary 242 (H) 70 - 99 mg/dL    Comment: Glucose reference range applies only to samples taken after fasting for at least 8 hours.  Glucose, capillary     Status: Abnormal   Collection Time: 09/12/19  9:33 PM  Result Value Ref Range   Glucose-Capillary 340 (H) 70 - 99 mg/dL    Comment: Glucose reference range applies only to samples taken after fasting for at least 8 hours.  Basic metabolic panel      Status: Abnormal   Collection Time: 09/13/19  7:05 AM  Result Value Ref Range   Sodium 138 135 - 145 mmol/L   Potassium 3.4 (L) 3.5 - 5.1 mmol/L   Chloride 88 (L) 98 - 111 mmol/L   CO2 38 (H) 22 - 32 mmol/L   Glucose, Bld 114 (H) 70 - 99 mg/dL    Comment: Glucose reference range applies only to samples taken after fasting for at least 8 hours.   BUN 48 (H) 8 - 23 mg/dL   Creatinine, Ser 3.22 (H) 0.44 - 1.00 mg/dL   Calcium 9.4 8.9 - 10.3 mg/dL   GFR calc non Af Amer 12 (L) >60 mL/min   GFR calc Af Amer 14 (L) >60 mL/min   Anion gap 12 5 - 15    Comment: Performed at [facility], [address], [place], [place] [address]  Glucose, capillary     Status: Abnormal   Collection Time: 09/13/19  7:24 AM  Result Value Ref Range   Glucose-Capillary 116 (H) 70 - 99 mg/dL    Comment: Glucose reference range applies only to samples taken after fasting for at least 8 hours.  Glucose, capillary     Status: Abnormal   Collection Time: 09/13/19 11:24 AM  Result Value Ref Range   Glucose-Capillary 231 (H) 70 - 99 mg/dL    Comment: Glucose reference range applies only to samples taken after fasting for at least 8 hours.    RADIOGRAPHIC STUDIES: I have personally reviewed the radiological images as listed and agreed with the findings in the report. DG Chest Port 1 View  Result Date: 09/09/2019 CLINICAL DATA:  Dyspnea EXAM: PORTABLE CHEST 1 VIEW COMPARISON:  07/30/2019 FINDINGS: The lungs are symmetrically expanded. There has developed moderate interstitial pulmonary edema and small bilateral pleural effusions in keeping with changes of moderate cardiogenic failure. No pneumothorax. Cardiac size is mildly enlarged, unchanged. Implanted loop recorder device again noted overlying the inferior left hemithorax. No acute bone abnormality. IMPRESSION: Moderate cardiogenic failure. Electronically Signed   By: Fidela Salisbury MD   On: 09/09/2019 20:52    ASSESSMENT:  1.  Normocytic anemia: -Recent hospital  admission with hemoglobin of 6.7 on 09/09/2019, received 1 unit of PRBC. -Ferrlecit 125 mg on 08/20/2019 and 08/02/2019. -Colonoscopy on 04/04/2009 at [facility] with 3 small polyps ablated via cold biopsy, few small diverticula in the sigmoid colon. -Colonoscopy on 09/09/2014 with tubular adenoma in the cecum removed. -CT chest without contrast on 07/31/2019 showed extensive irregular bilateral groundglass airspace opacity, most conspicuous in the right upper lobe. -Likely etiology from CKD and iron deficiency.    PLAN:  1.  Normocytic anemia: -Last CBC on 09/12/2019 shows hemoglobin 8.1 with MCV of 86.  White count and platelets were grossly normal. -Denies any bleeding per rectum or melena. -We will repeat her CBC today and rule out other nutritional deficiencies by checking D32, folic acid, methylmalonic acid and copper levels.  We will also check SPEP.  We will check for hemolysis with LDH, reticulocyte count and haptoglobin. -She will likely benefit from parenteral iron therapy with Feraheme weekly x2.  We talked about the side effects including rare chance of allergic reactions. -I will see her back in 1 week for follow-up.  If there is no improvement with the parenteral iron therapy, we will also consider Epogen.  2.  CKD stage IV: -Latest creatinine between 2.8-3.2.  3.  Atrial fibrillation: -She is on Eliquis 2.5 mg twice daily.   All questions were answered. The patient knows to call the clinic with any problems, questions or concerns.   Derek Jack, MD 10/07/19 3:13 PM  [place] [phone]   I, Milinda Antis, am acting as a scribe for Dr. Sanda Linger.  I, Derek Jack MD, have reviewed the above documentation for accuracy and completeness, and I agree with the above.

## 2019-10-08 DIAGNOSIS — D631 Anemia in chronic kidney disease: Secondary | ICD-10-CM | POA: Diagnosis not present

## 2019-10-08 DIAGNOSIS — E1159 Type 2 diabetes mellitus with other circulatory complications: Secondary | ICD-10-CM | POA: Diagnosis not present

## 2019-10-08 DIAGNOSIS — J9621 Acute and chronic respiratory failure with hypoxia: Secondary | ICD-10-CM | POA: Diagnosis not present

## 2019-10-08 DIAGNOSIS — N184 Chronic kidney disease, stage 4 (severe): Secondary | ICD-10-CM | POA: Diagnosis not present

## 2019-10-08 DIAGNOSIS — I480 Paroxysmal atrial fibrillation: Secondary | ICD-10-CM | POA: Diagnosis not present

## 2019-10-08 DIAGNOSIS — E1122 Type 2 diabetes mellitus with diabetic chronic kidney disease: Secondary | ICD-10-CM | POA: Diagnosis not present

## 2019-10-08 DIAGNOSIS — I5033 Acute on chronic diastolic (congestive) heart failure: Secondary | ICD-10-CM | POA: Diagnosis not present

## 2019-10-08 DIAGNOSIS — I2510 Atherosclerotic heart disease of native coronary artery without angina pectoris: Secondary | ICD-10-CM | POA: Diagnosis not present

## 2019-10-08 DIAGNOSIS — I130 Hypertensive heart and chronic kidney disease with heart failure and stage 1 through stage 4 chronic kidney disease, or unspecified chronic kidney disease: Secondary | ICD-10-CM | POA: Diagnosis not present

## 2019-10-08 LAB — PROTEIN ELECTROPHORESIS, SERUM
A/G Ratio: 0.9 (ref 0.7–1.7)
Albumin ELP: 3 g/dL (ref 2.9–4.4)
Alpha-1-Globulin: 0.2 g/dL (ref 0.0–0.4)
Alpha-2-Globulin: 0.9 g/dL (ref 0.4–1.0)
Beta Globulin: 1.2 g/dL (ref 0.7–1.3)
Gamma Globulin: 1 g/dL (ref 0.4–1.8)
Globulin, Total: 3.4 g/dL (ref 2.2–3.9)
Total Protein ELP: 6.4 g/dL (ref 6.0–8.5)

## 2019-10-08 LAB — HAPTOGLOBIN: Haptoglobin: 282 mg/dL (ref 41–333)

## 2019-10-10 LAB — METHYLMALONIC ACID, SERUM: Methylmalonic Acid, Quantitative: 422 nmol/L — ABNORMAL HIGH (ref 0–378)

## 2019-10-11 DIAGNOSIS — I480 Paroxysmal atrial fibrillation: Secondary | ICD-10-CM | POA: Diagnosis not present

## 2019-10-11 DIAGNOSIS — I5033 Acute on chronic diastolic (congestive) heart failure: Secondary | ICD-10-CM | POA: Diagnosis not present

## 2019-10-11 DIAGNOSIS — J9621 Acute and chronic respiratory failure with hypoxia: Secondary | ICD-10-CM | POA: Diagnosis not present

## 2019-10-11 DIAGNOSIS — N184 Chronic kidney disease, stage 4 (severe): Secondary | ICD-10-CM | POA: Diagnosis not present

## 2019-10-11 DIAGNOSIS — E1122 Type 2 diabetes mellitus with diabetic chronic kidney disease: Secondary | ICD-10-CM | POA: Diagnosis not present

## 2019-10-11 DIAGNOSIS — I639 Cerebral infarction, unspecified: Secondary | ICD-10-CM | POA: Diagnosis not present

## 2019-10-11 DIAGNOSIS — E1159 Type 2 diabetes mellitus with other circulatory complications: Secondary | ICD-10-CM | POA: Diagnosis not present

## 2019-10-11 DIAGNOSIS — I2510 Atherosclerotic heart disease of native coronary artery without angina pectoris: Secondary | ICD-10-CM | POA: Diagnosis not present

## 2019-10-11 DIAGNOSIS — J9601 Acute respiratory failure with hypoxia: Secondary | ICD-10-CM | POA: Diagnosis not present

## 2019-10-11 DIAGNOSIS — I130 Hypertensive heart and chronic kidney disease with heart failure and stage 1 through stage 4 chronic kidney disease, or unspecified chronic kidney disease: Secondary | ICD-10-CM | POA: Diagnosis not present

## 2019-10-11 DIAGNOSIS — I5032 Chronic diastolic (congestive) heart failure: Secondary | ICD-10-CM | POA: Diagnosis not present

## 2019-10-11 DIAGNOSIS — D631 Anemia in chronic kidney disease: Secondary | ICD-10-CM | POA: Diagnosis not present

## 2019-10-12 DIAGNOSIS — J9621 Acute and chronic respiratory failure with hypoxia: Secondary | ICD-10-CM | POA: Diagnosis not present

## 2019-10-12 DIAGNOSIS — I480 Paroxysmal atrial fibrillation: Secondary | ICD-10-CM | POA: Diagnosis not present

## 2019-10-12 DIAGNOSIS — I2510 Atherosclerotic heart disease of native coronary artery without angina pectoris: Secondary | ICD-10-CM | POA: Diagnosis not present

## 2019-10-12 DIAGNOSIS — E1122 Type 2 diabetes mellitus with diabetic chronic kidney disease: Secondary | ICD-10-CM | POA: Diagnosis not present

## 2019-10-12 DIAGNOSIS — E1159 Type 2 diabetes mellitus with other circulatory complications: Secondary | ICD-10-CM | POA: Diagnosis not present

## 2019-10-12 DIAGNOSIS — I130 Hypertensive heart and chronic kidney disease with heart failure and stage 1 through stage 4 chronic kidney disease, or unspecified chronic kidney disease: Secondary | ICD-10-CM | POA: Diagnosis not present

## 2019-10-12 DIAGNOSIS — I5033 Acute on chronic diastolic (congestive) heart failure: Secondary | ICD-10-CM | POA: Diagnosis not present

## 2019-10-12 DIAGNOSIS — N184 Chronic kidney disease, stage 4 (severe): Secondary | ICD-10-CM | POA: Diagnosis not present

## 2019-10-12 DIAGNOSIS — D631 Anemia in chronic kidney disease: Secondary | ICD-10-CM | POA: Diagnosis not present

## 2019-10-12 LAB — COPPER, SERUM: Copper: 138 ug/dL (ref 80–158)

## 2019-10-14 DIAGNOSIS — J9621 Acute and chronic respiratory failure with hypoxia: Secondary | ICD-10-CM | POA: Diagnosis not present

## 2019-10-14 DIAGNOSIS — I480 Paroxysmal atrial fibrillation: Secondary | ICD-10-CM | POA: Diagnosis not present

## 2019-10-14 DIAGNOSIS — D631 Anemia in chronic kidney disease: Secondary | ICD-10-CM | POA: Diagnosis not present

## 2019-10-14 DIAGNOSIS — I130 Hypertensive heart and chronic kidney disease with heart failure and stage 1 through stage 4 chronic kidney disease, or unspecified chronic kidney disease: Secondary | ICD-10-CM | POA: Diagnosis not present

## 2019-10-14 DIAGNOSIS — I2510 Atherosclerotic heart disease of native coronary artery without angina pectoris: Secondary | ICD-10-CM | POA: Diagnosis not present

## 2019-10-14 DIAGNOSIS — I5033 Acute on chronic diastolic (congestive) heart failure: Secondary | ICD-10-CM | POA: Diagnosis not present

## 2019-10-14 DIAGNOSIS — E1159 Type 2 diabetes mellitus with other circulatory complications: Secondary | ICD-10-CM | POA: Diagnosis not present

## 2019-10-14 DIAGNOSIS — N184 Chronic kidney disease, stage 4 (severe): Secondary | ICD-10-CM | POA: Diagnosis not present

## 2019-10-14 DIAGNOSIS — E1122 Type 2 diabetes mellitus with diabetic chronic kidney disease: Secondary | ICD-10-CM | POA: Diagnosis not present

## 2019-10-18 NOTE — Progress Notes (Signed)
PCP:  Asencion Noble, MD Primary Cardiologist: Virl Axe, MD Electrophysiologist: Virl Axe, MD   Amy Wiggins is a 86 y.o. female seen today for Virl Axe, MD for post hospital follow up.  Since discharge from hospital the patient reports doing OK. She denies lightheadedness or dizziness. No bleeding, but also had non prior to her recent admission. She is not followed by Nephrology, and thus far has not heard about any referral.  she denies chest pain, palpitations, dyspnea, PND, orthopnea, nausea, vomiting, dizziness, syncope, edema, weight gain, or early satiety.  Device interrogation MDT ILR, implanted 06/07/15, cryptogenic stroke Reached RRT 01/20/2019, unable to interrogate today  Past Medical History:  Diagnosis Date  . Chronic anticoagulation   . Depression   . Diabetes mellitus   . Hyperlipidemia   . Hypertension   . PAF (paroxysmal atrial fibrillation) ([place])   . PFO (patent foramen ovale)   . Stroke ([place])    TIA's x 2   Past Surgical History:  Procedure Laterality Date  . COLONOSCOPY    . COLONOSCOPY N/A 09/09/2014   Procedure: COLONOSCOPY;  Surgeon: Rogene Houston, MD;  [facility];  Service: Endoscopy;  Laterality: N/A;  1010 - moved to 7:30 - Ann to notify  . EP IMPLANTABLE DEVICE N/A 06/07/2015   Procedure: Loop Recorder Insertion;  Surgeon: Deboraha Sprang, MD;  Location: [facility];  Service: Cardiovascular;  Laterality: N/A;  . TEE WITHOUT CARDIOVERSION N/A 06/07/2015   Procedure: TRANSESOPHAGEAL ECHOCARDIOGRAM (TEE);  Surgeon: Thayer Headings, MD;  Location: [facility] ENDOSCOPY;  Service: Cardiovascular;  Laterality: N/A;    Current Outpatient Medications  Medication Sig Dispense Refill  . acetaminophen (TYLENOL) 500 MG tablet Take 1,000 mg by mouth at bedtime. may also take 2 tablets during the day as needed for pain    . amiodarone (PACERONE) 200 MG tablet Take 1 tablet (200 mg total) by mouth daily. 30 tablet 0  . buPROPion (WELLBUTRIN  XL) 300 MG 24 hr tablet Take 1 tablet (300 mg total) by mouth daily. 30 tablet 0  . citalopram (CELEXA) 20 MG tablet Take 1 tablet (20 mg total) by mouth daily. 30 tablet 0  . diltiazem (CARDIZEM CD) 120 MG 24 hr capsule Take 1 capsule (120 mg total) by mouth daily. 30 capsule 0  . ELIQUIS 2.5 MG TABS tablet Take 1 tablet (2.5 mg total) by mouth 2 (two) times daily. 60 tablet 0  . insulin aspart (NOVOLOG FLEXPEN) 100 UNIT/ML FlexPen Inject 10 Units into the skin 3 (three) times daily with meals. insulin pen; 100 unit/mL (3 mL); amt: 10 units; subcutaneous Special Instructions: admin 5 units for CBG >150 Before Meals 08:00 AM, 11:30 AM, 05:00 PM 15 mL 0  . insulin glargine (LANTUS) 100 UNIT/ML injection Inject 0.3 mLs (30 Units total) into the skin at bedtime. 15 mL 0  . ipratropium-albuterol (DUONEB) 0.5-2.5 (3) MG/3ML SOLN Take 3 mLs by nebulization every 6 (six) hours. 360 mL 0  . Magnesium Oxide 250 MG TABS Take 2 tablets (500 mg total) by mouth every evening. 60 tablet 0  . Multiple Vitamin (MULTIVITAMIN WITH MINERALS) TABS tablet Take 1 tablet by mouth at bedtime. For supplement    . Multiple Vitamins-Minerals (PRESERVISION AREDS 2) CAPS Take 1 capsule by mouth 2 (two) times daily.    . OXYGEN Inhale 2 L into the lungs continuous.    . pantoprazole (PROTONIX) 40 MG tablet Take 1 tablet (40 mg total) by mouth daily. For  acid reflux/gerd 30 tablet 0  . potassium chloride SA (KLOR-CON) 20 MEQ tablet Take 1 tablet (20 mEq total) by mouth daily. 30 tablet 0  . torsemide (DEMADEX) 20 MG tablet Take 40mg  in am and 20mg  in the evening. 90 tablet 1  . traZODone (DESYREL) 150 MG tablet Take 0.5 tablets (75 mg total) by mouth at bedtime. 0.5 tablet to = 75 mg 15 tablet 0   No current facility-administered medications for this visit.    No Known Allergies  Social History   Socioeconomic History  . Marital status: Widowed    Spouse name: Not on file  . Number of children: 2  . Years of education:  Not on file  . Highest education level: Not on file  Occupational History  . Occupation: retired  Tobacco Use  . Smoking status: Former Smoker    Years: 10.00  . Smokeless tobacco: Never Used  Vaping Use  . Vaping Use: Never used  Substance and Sexual Activity  . Alcohol use: No  . Drug use: No  . Sexual activity: Never  Other Topics Concern  . Not on file  Social History Narrative  . Not on file   Social Determinants of Health   Financial Resource Strain:   . Difficulty of Paying Living Expenses: Not on file  Food Insecurity:   . Worried About Charity fundraiser in the Last Year: Not on file  . Ran Out of Food in the Last Year: Not on file  Transportation Needs: No Transportation Needs  . Lack of Transportation (Medical): No  . Lack of Transportation (Non-Medical): No  Physical Activity:   . Days of Exercise per Week: Not on file  . Minutes of Exercise per Session: Not on file  Stress:   . Feeling of Stress : Not on file  Social Connections:   . Frequency of Communication with Friends and Family: Not on file  . Frequency of Social Gatherings with Friends and Family: Not on file  . Attends Religious Services: Not on file  . Active Member of Clubs or Organizations: Not on file  . Attends Archivist Meetings: Not on file  . Marital Status: Not on file  Intimate Partner Violence:   . Fear of Current or Ex-Partner: Not on file  . Emotionally Abused: Not on file  . Physically Abused: Not on file  . Sexually Abused: Not on file     Review of Systems: All other systems reviewed and are otherwise negative except as noted above.  Physical Exam: Vitals:   10/19/19 1209  BP: (!) 96/58  Pulse: 76  SpO2: 100%  Weight: 186 lb (84.4 kg)  Height: 5\' 7"  (1.702 m)    GEN- The patient is well appearing, alert and oriented x 3 today.   HEENT: normocephalic, atraumatic; sclera clear, conjunctiva pink; hearing intact; oropharynx clear; neck supple, no JVP Lymph-  no cervical lymphadenopathy Lungs- Clear to ausculation bilaterally, normal work of breathing.  No wheezes, rales, rhonchi Heart- Regular rate and rhythm. 3/6 MS, PMI not laterally displaced GI- soft, non-tender, non-distended, bowel sounds present, no hepatosplenomegaly Extremities- no clubbing, cyanosis, or edema; DP/PT/radial pulses 2+ bilaterally MS- no significant deformity or atrophy Skin- warm and dry, no rash or lesion Psych- euthymic mood, full affect Neuro- strength and sensation are intact  EKG is not ordered.   Additional studies reviewed include: Recent admission notes, Previous EP office notes.  Assessment and Plan:  1. Paroxysmal Afib CHA2DS2Vasc is 7, on Eliquis,  appropriately dosed (her Creat appears generally >1.5 and gradually trending up) ILR is EOS, she is not interested in having it removed Regular rhythm today.   On amio and dilt seem to be working well for her TSH elevated 8/12. Labs today.    2. HTN Meds down titrated last visit and previously taken off losartan with hypotension.   3. HFpEF Volume status stable on exam      4. Fatigue, chronic component Recent admission for anemia Stable post discharge  5. Murmur TTE <1 year ago reviewed Echo 07/28/2019 LVEF 60-65%, Moderate LVH, Grade 2 DD, severe LAE, Moderate by mean gradient of 10 mmHg She needs to be seen by a nephrologist before any surgical considerations would be given, and I do not suspect this is contributing to her symptoms which are mostly likely driven by her anemia at present. Also note palliative care discussions 07/2019.  6. CKD IV Creatinine has gradually trended up over the past year. Refer to nephrology.   Shirley Friar, PA-C  10/19/19 1:57 PM

## 2019-10-19 ENCOUNTER — Encounter: Payer: Self-pay | Admitting: Student

## 2019-10-19 ENCOUNTER — Other Ambulatory Visit: Payer: Self-pay

## 2019-10-19 ENCOUNTER — Ambulatory Visit (INDEPENDENT_AMBULATORY_CARE_PROVIDER_SITE_OTHER): Payer: Medicare HMO | Admitting: Student

## 2019-10-19 VITALS — BP 96/58 | HR 76 | Ht 67.0 in | Wt 186.0 lb

## 2019-10-19 DIAGNOSIS — D631 Anemia in chronic kidney disease: Secondary | ICD-10-CM | POA: Diagnosis not present

## 2019-10-19 DIAGNOSIS — I5032 Chronic diastolic (congestive) heart failure: Secondary | ICD-10-CM

## 2019-10-19 DIAGNOSIS — I639 Cerebral infarction, unspecified: Secondary | ICD-10-CM | POA: Diagnosis not present

## 2019-10-19 DIAGNOSIS — J9621 Acute and chronic respiratory failure with hypoxia: Secondary | ICD-10-CM | POA: Diagnosis not present

## 2019-10-19 DIAGNOSIS — I5033 Acute on chronic diastolic (congestive) heart failure: Secondary | ICD-10-CM | POA: Diagnosis not present

## 2019-10-19 DIAGNOSIS — I480 Paroxysmal atrial fibrillation: Secondary | ICD-10-CM | POA: Diagnosis not present

## 2019-10-19 DIAGNOSIS — E1122 Type 2 diabetes mellitus with diabetic chronic kidney disease: Secondary | ICD-10-CM

## 2019-10-19 DIAGNOSIS — N184 Chronic kidney disease, stage 4 (severe): Secondary | ICD-10-CM

## 2019-10-19 DIAGNOSIS — Z79899 Other long term (current) drug therapy: Secondary | ICD-10-CM | POA: Diagnosis not present

## 2019-10-19 DIAGNOSIS — E1159 Type 2 diabetes mellitus with other circulatory complications: Secondary | ICD-10-CM | POA: Diagnosis not present

## 2019-10-19 DIAGNOSIS — I130 Hypertensive heart and chronic kidney disease with heart failure and stage 1 through stage 4 chronic kidney disease, or unspecified chronic kidney disease: Secondary | ICD-10-CM | POA: Diagnosis not present

## 2019-10-19 DIAGNOSIS — I2510 Atherosclerotic heart disease of native coronary artery without angina pectoris: Secondary | ICD-10-CM | POA: Diagnosis not present

## 2019-10-19 LAB — BASIC METABOLIC PANEL
BUN/Creatinine Ratio: 16 (ref 12–28)
BUN: 50 mg/dL — ABNORMAL HIGH (ref 8–27)
CO2: 28 mmol/L (ref 20–29)
Calcium: 9.4 mg/dL (ref 8.7–10.3)
Chloride: 93 mmol/L — ABNORMAL LOW (ref 96–106)
Creatinine, Ser: 3.04 mg/dL — ABNORMAL HIGH (ref 0.57–1.00)
GFR calc Af Amer: 15 mL/min/{1.73_m2} — ABNORMAL LOW (ref >59)
GFR calc non Af Amer: 13 mL/min/{1.73_m2} — ABNORMAL LOW (ref >59)
Glucose: 189 mg/dL — ABNORMAL HIGH (ref 65–99)
Potassium: 4.2 mmol/L (ref 3.5–5.2)
Sodium: 134 mmol/L (ref 134–144)

## 2019-10-19 LAB — CBC WITH DIFFERENTIAL/PLATELET
Basophils Absolute: 0.1 10*3/uL (ref 0.0–0.2)
Basos: 1 %
EOS (ABSOLUTE): 0.5 10*3/uL — ABNORMAL HIGH (ref 0.0–0.4)
Eos: 5 %
Hematocrit: 23.3 % — ABNORMAL LOW (ref 34.0–46.6)
Hemoglobin: 6.9 g/dL — CL (ref 11.1–15.9)
Immature Grans (Abs): 0.1 10*3/uL (ref 0.0–0.1)
Immature Granulocytes: 1 %
Lymphocytes Absolute: 1.1 10*3/uL (ref 0.7–3.1)
Lymphs: 13 %
MCH: 22.5 pg — ABNORMAL LOW (ref 26.6–33.0)
MCHC: 29.6 g/dL — ABNORMAL LOW (ref 31.5–35.7)
MCV: 76 fL — ABNORMAL LOW (ref 79–97)
Monocytes Absolute: 1 10*3/uL — ABNORMAL HIGH (ref 0.1–0.9)
Monocytes: 12 %
Neutrophils Absolute: 5.9 10*3/uL (ref 1.4–7.0)
Neutrophils: 68 %
Platelets: 361 10*3/uL (ref 150–450)
RBC: 3.07 x10E6/uL — ABNORMAL LOW (ref 3.77–5.28)
RDW: 15.2 % (ref 11.7–15.4)
WBC: 8.6 10*3/uL (ref 3.4–10.8)

## 2019-10-19 NOTE — Patient Instructions (Addendum)
Medication Instructions:  *If you need a refill on your cardiac medications before your next appointment, please call your pharmacy*  Lab Work: If you have labs (blood work) drawn today and your tests are completely normal, you will receive your results only by: Marland Kitchen MyChart Message (if you have MyChart) OR . A paper copy in the mail If you have any lab test that is abnormal or we need to change your treatment, we will call you to review the results.  Follow-Up: At [facility], you and your health needs are our priority.  As part of our continuing mission to provide you with exceptional heart care, we have created designated Provider Care Teams.  These Care Teams include your primary Cardiologist (physician) and Advanced Practice Providers (APPs -  Physician Assistants and Nurse Practitioners) who all work together to provide you with the care you need, when you need it.  We recommend signing up for the patient portal called "MyChart".  Sign up information is provided on this After Visit Summary.  MyChart is used to connect with patients for Virtual Visits (Telemedicine).  Patients are able to view lab/test results, encounter notes, upcoming appointments, etc.  Non-urgent messages can be sent to your provider as well.   To learn more about what you can do with MyChart, go to [email or web address].    Your next appointment:   Your physician recommends that you schedule a follow-up appointment in 6 MONTHS with Dr. Caryl Comes   The format for your next appointment:   In Person with Dr. Gari Crown have been referred to Nephrology for evaluation of your Kidney Disease. Someone form [facility] should reach out to you to arrange consultation.

## 2019-10-20 ENCOUNTER — Other Ambulatory Visit: Payer: Self-pay

## 2019-10-20 ENCOUNTER — Encounter (HOSPITAL_COMMUNITY): Payer: Self-pay | Admitting: *Deleted

## 2019-10-20 ENCOUNTER — Emergency Department (HOSPITAL_COMMUNITY)
Admission: EM | Admit: 2019-10-20 | Discharge: 2019-10-20 | Disposition: A | Discharge status: Home / Self Care | Payer: Medicare HMO | Attending: Emergency Medicine | Admitting: Emergency Medicine

## 2019-10-20 ENCOUNTER — Telehealth: Payer: Self-pay

## 2019-10-20 DIAGNOSIS — Z7901 Long term (current) use of anticoagulants: Secondary | ICD-10-CM | POA: Insufficient documentation

## 2019-10-20 DIAGNOSIS — Z794 Long term (current) use of insulin: Secondary | ICD-10-CM | POA: Insufficient documentation

## 2019-10-20 DIAGNOSIS — Z79899 Other long term (current) drug therapy: Secondary | ICD-10-CM | POA: Diagnosis not present

## 2019-10-20 DIAGNOSIS — I5033 Acute on chronic diastolic (congestive) heart failure: Secondary | ICD-10-CM | POA: Insufficient documentation

## 2019-10-20 DIAGNOSIS — Z8673 Personal history of transient ischemic attack (TIA), and cerebral infarction without residual deficits: Secondary | ICD-10-CM | POA: Diagnosis not present

## 2019-10-20 DIAGNOSIS — E1122 Type 2 diabetes mellitus with diabetic chronic kidney disease: Secondary | ICD-10-CM | POA: Diagnosis not present

## 2019-10-20 DIAGNOSIS — N184 Chronic kidney disease, stage 4 (severe): Secondary | ICD-10-CM | POA: Insufficient documentation

## 2019-10-20 DIAGNOSIS — R42 Dizziness and giddiness: Secondary | ICD-10-CM | POA: Diagnosis not present

## 2019-10-20 DIAGNOSIS — D649 Anemia, unspecified: Secondary | ICD-10-CM | POA: Diagnosis not present

## 2019-10-20 DIAGNOSIS — Z87891 Personal history of nicotine dependence: Secondary | ICD-10-CM | POA: Insufficient documentation

## 2019-10-20 DIAGNOSIS — I130 Hypertensive heart and chronic kidney disease with heart failure and stage 1 through stage 4 chronic kidney disease, or unspecified chronic kidney disease: Secondary | ICD-10-CM | POA: Insufficient documentation

## 2019-10-20 DIAGNOSIS — I4891 Unspecified atrial fibrillation: Secondary | ICD-10-CM | POA: Diagnosis not present

## 2019-10-20 LAB — CBC
HCT: 25.1 % — ABNORMAL LOW (ref 36.0–46.0)
Hemoglobin: 6.9 g/dL — CL (ref 12.0–15.0)
MCH: 22.5 pg — ABNORMAL LOW (ref 26.0–34.0)
MCHC: 27.5 g/dL — ABNORMAL LOW (ref 30.0–36.0)
MCV: 82 fL (ref 80.0–100.0)
Platelets: 384 10*3/uL (ref 150–400)
RBC: 3.06 MIL/uL — ABNORMAL LOW (ref 3.87–5.11)
RDW: 16.9 % — ABNORMAL HIGH (ref 11.5–15.5)
WBC: 8 10*3/uL (ref 4.0–10.5)
nRBC: 0 % (ref 0.0–0.2)

## 2019-10-20 LAB — HEMOGLOBIN AND HEMATOCRIT, BLOOD
HCT: 30.6 % — ABNORMAL LOW (ref 36.0–46.0)
Hemoglobin: 8.7 g/dL — ABNORMAL LOW (ref 12.0–15.0)

## 2019-10-20 LAB — CBG MONITORING, ED: Glucose-Capillary: 317 mg/dL — ABNORMAL HIGH (ref 70–99)

## 2019-10-20 LAB — POC OCCULT BLOOD, ED: Fecal Occult Bld: NEGATIVE

## 2019-10-20 LAB — PREPARE RBC (CROSSMATCH)

## 2019-10-20 MED ORDER — SODIUM CHLORIDE 0.9% IV SOLUTION: Freq: Once | INTRAVENOUS | Status: AC

## 2019-10-20 NOTE — ED Provider Notes (Signed)
[facility] EMERGENCY DEPARTMENT Provider Note   CSN: [id number] Arrival date & time: 10/20/19  1123     History Chief Complaint  Patient presents with   Abnormal Lab    Amy Wiggins is a 86 y.o. female presenting with granddaughter after her PCP recommended she go to the ED for blood transfusion.  Patient reports that she has felt dizzy.  Her dizziness has been somewhat chronic and has been on and off for the last couple months.  She denies any headaches, visual changes.  She denies any fevers, chills, cough, sore throat, upper respiratory symptoms, diarrhea, constipation.  She denies any palpitations or recent bleeding.  She does report having needed transfusions in the past.     Past Medical History:  Diagnosis Date   Chronic anticoagulation    Depression    Diabetes mellitus    Hyperlipidemia    Hypertension    PAF (paroxysmal atrial fibrillation) (HCC)    PFO (patent foramen ovale)    Stroke ([place])    TIA's x 2    Patient Active Problem List   Diagnosis Date Noted   Acute on chronic anemia 09/09/2019   HCAP (healthcare-associated pneumonia) 08/26/2019   Hypertension associated with stage 4 chronic kidney disease due to type 2 diabetes mellitus ([place]) 08/04/2019   Hyperlipidemia associated with type 2 diabetes mellitus ([place]) 08/04/2019   CKD stage 4 due to type 2 diabetes mellitus ([place]) 08/04/2019   Gastroesophageal reflux disease 08/04/2019   Chronic constipation 08/04/2019   Major depression, recurrent, chronic ([place]) 08/04/2019   Hypomagnesemia 08/04/2019   Ground glass opacity present on imaging of lung 08/04/2019   Goals of care, counseling/discussion    Palliative care by specialist    Acute on chronic diastolic (congestive) heart failure ([place]) 07/24/2019   Acute on chronic respiratory failure with hypoxia ([place]) 09/29/2018   Atrial fibrillation with RVR (HCC)    Atrial fibrillation, chronic ([place]) 09/28/2018   Cerebellar infarct  ([place])    Stroke ([place]) 02/22/2016   HFpEF/Chronic diastolic congestive heart failure ([place]) 02/22/2016   Shortness of breath 01/14/2016   Cerebrovascular accident (CVA) due to embolism of right posterior cerebral artery ([place]) 08/08/2015   Essential hypertension 08/08/2015   HLD (hyperlipidemia) 08/08/2015   Type 2 diabetes mellitus with circulatory disorder ([place]) 08/08/2015   PFO (patent foramen ovale)    Hyperlipidemia 06/02/2015   CKD (chronic kidney disease) stage 4, GFR 15-29 ml/min ([place]) 06/02/2015   DM type 2 (diabetes mellitus, type 2) ([place]) 06/02/2015   Leukocytosis 06/02/2015   Muscle weakness (generalized) 09/16/2012   Pain in joint, shoulder region 09/16/2012    Past Surgical History:  Procedure Laterality Date   COLONOSCOPY     COLONOSCOPY N/A 09/09/2014   Procedure: COLONOSCOPY;  Surgeon: Rogene Houston, MD;  [facility];  Service: Endoscopy;  Laterality: N/A;  1010 - moved to 7:30 - Ann to notify   EP IMPLANTABLE DEVICE N/A 06/07/2015   Procedure: Loop Recorder Insertion;  Surgeon: Deboraha Sprang, MD;  Location: [place] [facility];  Service: Cardiovascular;  Laterality: N/A;   TEE WITHOUT CARDIOVERSION N/A 06/07/2015   Procedure: TRANSESOPHAGEAL ECHOCARDIOGRAM (TEE);  Surgeon: Thayer Headings, MD;  Location: [facility] ENDOSCOPY;  Service: Cardiovascular;  Laterality: N/A;     OB History   No obstetric history on file.     Family History  Problem Relation Age of Onset   Stroke Father    Dementia Sister    Atrial fibrillation Sister  Parkinson's disease Sister    Heart attack Daughter    Diabetes Son    Hypertension Son     Social History   Tobacco Use   Smoking status: Former Smoker    Years: 10.00   Smokeless tobacco: Never Used  Scientific laboratory technician Use: Never used  Substance Use Topics   Alcohol use: No   Drug use: No    Home Medications Prior to Admission medications   Medication Sig Start Date End Date  Taking? Authorizing Provider  acetaminophen (TYLENOL) 500 MG tablet Take 1,000 mg by mouth at bedtime. may also take 2 tablets during the day as needed for pain 08/03/19  Yes [provider]  amiodarone (PACERONE) 200 MG tablet Take 1 tablet (200 mg total) by mouth daily. 08/26/19  Yes Gerlene Fee, NP  buPROPion (WELLBUTRIN XL) 300 MG 24 hr tablet Take 1 tablet (300 mg total) by mouth daily. 08/26/19  Yes Gerlene Fee, NP  citalopram (CELEXA) 20 MG tablet Take 1 tablet (20 mg total) by mouth daily. 08/26/19  Yes Gerlene Fee, NP  diltiazem (CARDIZEM CD) 120 MG 24 hr capsule Take 1 capsule (120 mg total) by mouth daily. 08/26/19  Yes Gerlene Fee, NP  ELIQUIS 2.5 MG TABS tablet Take 1 tablet (2.5 mg total) by mouth 2 (two) times daily. 08/26/19  Yes Gerlene Fee, NP  insulin aspart (NOVOLOG FLEXPEN) 100 UNIT/ML FlexPen Inject 10 Units into the skin 3 (three) times daily with meals. insulin pen; 100 unit/mL (3 mL); amt: 10 units; subcutaneous Special Instructions: admin 5 units for CBG >150 Before Meals 08:00 AM, 11:30 AM, 05:00 PM 08/26/19  Yes Gerlene Fee, NP  insulin glargine (LANTUS) 100 UNIT/ML injection Inject 0.3 mLs (30 Units total) into the skin at bedtime. 08/26/19  Yes Gerlene Fee, NP  ipratropium-albuterol (DUONEB) 0.5-2.5 (3) MG/3ML SOLN Take 3 mLs by nebulization every 6 (six) hours. 08/26/19  Yes Gerlene Fee, NP  Magnesium Oxide 250 MG TABS Take 2 tablets (500 mg total) by mouth every evening. 08/26/19  Yes Gerlene Fee, NP  mirtazapine (REMERON) 15 MG tablet Take 15 mg by mouth at bedtime. 10/12/19  Yes [provider]  Multiple Vitamin (MULTIVITAMIN WITH MINERALS) TABS tablet Take 1 tablet by mouth at bedtime. For supplement   Yes [provider]  Multiple Vitamins-Minerals (PRESERVISION AREDS 2) CAPS Take 1 capsule by mouth 2 (two) times daily.   Yes [provider]  pantoprazole (PROTONIX) 40 MG tablet Take 1 tablet (40 mg total)  by mouth daily. For acid reflux/gerd 08/26/19  Yes Gerlene Fee, NP  potassium chloride SA (KLOR-CON) 20 MEQ tablet Take 1 tablet (20 mEq total) by mouth daily. 08/26/19  Yes Gerlene Fee, NP  torsemide (DEMADEX) 20 MG tablet Take 40mg  in am and 20mg  in the evening. 09/13/19  Yes Barton Dubois, MD  OXYGEN Inhale 2 L into the lungs continuous. 08/03/19   [provider]  traZODone (DESYREL) 150 MG tablet Take 0.5 tablets (75 mg total) by mouth at bedtime. 0.5 tablet to = 75 mg Patient not taking: Reported on 10/20/2019 08/26/19   Gerlene Fee, NP    Allergies    Patient has no known allergies.  Review of Systems   Review of Systems  Constitutional: Negative for chills and fever.  HENT: Negative for congestion, rhinorrhea, sinus pain and sore throat.   Eyes: Negative for visual disturbance.  Respiratory: Negative for cough,  chest tightness, shortness of breath and wheezing.   Cardiovascular: Negative for chest pain, palpitations and leg swelling.  Gastrointestinal: Negative for abdominal pain, blood in stool, constipation, nausea and vomiting.  Endocrine: Negative.   Genitourinary: Negative.   Musculoskeletal: Negative.   Skin: Negative for rash and wound.  Neurological: Positive for dizziness and light-headedness. Negative for tremors, seizures, syncope and speech difficulty.  Hematological: Negative.  Does not bruise/bleed easily.  Psychiatric/Behavioral: Negative.     Physical Exam Updated Vital Signs BP 124/69 (BP Location: Right Arm)    Pulse 84    Temp 98 F (36.7 C) (Oral)    Resp 16    Ht 5\' 7"  (1.702 m)    Wt 84.4 kg    SpO2 98%    BMI 29.13 kg/m   Physical Exam Vitals and nursing note reviewed.  Constitutional:      General: She is not in acute distress.    Appearance: Normal appearance. She is obese. She is not ill-appearing or toxic-appearing.  HENT:     Head: Normocephalic and atraumatic.     Nose: Nose normal.     Mouth/Throat:     Mouth: Mucous  membranes are moist.     Pharynx: No oropharyngeal exudate.  Eyes:     Extraocular Movements: Extraocular movements intact.     Conjunctiva/sclera: Conjunctivae normal.     Pupils: Pupils are equal, round, and reactive to light.  Cardiovascular:     Rate and Rhythm: Normal rate and regular rhythm.     Pulses: Normal pulses.     Heart sounds: Murmur heard.   Pulmonary:     Effort: Pulmonary effort is normal.     Breath sounds: Normal breath sounds.  Abdominal:     General: Abdomen is flat. Bowel sounds are normal. There is no distension.     Palpations: Abdomen is soft.     Tenderness: There is no abdominal tenderness.  Musculoskeletal:        General: Normal range of motion.     Cervical back: Normal range of motion and neck supple.     Right lower leg: Edema present.     Left lower leg: Edema present.     Comments: Trace BLEE to mid shins   Skin:    General: Skin is warm.     Capillary Refill: Capillary refill takes less than 2 seconds.     Coloration: Skin is not pale.  Neurological:     General: No focal deficit present.     Mental Status: She is alert and oriented to person, place, and time. Mental status is at baseline.  Psychiatric:        Mood and Affect: Mood normal.        Behavior: Behavior normal.        Thought Content: Thought content normal.        Judgment: Judgment normal.     ED Results / Procedures / Treatments   Labs (all labs ordered are listed, but only abnormal results are displayed) Labs Reviewed  CBG MONITORING, ED - Abnormal; Notable for the following components:      Result Value   Glucose-Capillary 317 (*)    All other components within normal limits  POC OCCULT BLOOD, ED  TYPE AND SCREEN  PREPARE RBC (CROSSMATCH)    EKG None  Radiology No results found.  Procedures Procedures (including critical care time)  Medications Ordered in ED Medications  0.9 %  sodium chloride infusion (Manually program via Guardrails  IV Fluids) (has  no administration in time range)    ED Course  I have reviewed the triage vital signs and the nursing notes.  Pertinent labs & imaging results that were available during my care of the patient were reviewed by me and considered in my medical decision making (see chart for details).    MDM Rules/Calculators/A&P                          This is an 86 year old female with past medical history significant for CKD stage IV, type 2 diabetes, cryptogenic stroke, paroxysmal A. fib on Eliquis 2.5 mg twice daily, PFO, anemia of chronic and renal disease, HFpEF who presents today after abnormal results her PCP.  Patient's hemoglobin was 6.9 and her PCP recommended she go to the emergency department for transfusion. Baseline hemoglobin is  Patient denies any acute blood loss, bleeding. She is on eliquis BID for a fib. Given her history, this is likely due to chronic renal disease and not acute blood loss anemia. Baseline hemoglobin is 7-8. Transfusion threshold >7.0. On physical exam, patient is alert, oriented, no acute distress.  She is not ill-appearing and has normal peripheral pulses.  Given patient's cardiac history, she is under transfusion threshold.  Will type and cross, consent for blood transfusion. FOBT pending.  We will also monitor patient's volume status with transfusion given her history of heart failure in diuretic use for volume control.  Will obtain 2-hour posttransfusion H&H. If > 7.0, patient can be discharged home and follow-up with her PCP.  Signed out patient to [place] who will continue to manage patient.   Final Clinical Impression(s) / ED Diagnoses Final diagnoses:  Symptomatic anemia    Rx / DC Orders ED Discharge Orders    None        Wilber Oliphant, MD 10/20/19 1507    Elnora Morrison, MD 10/21/19 [phone]

## 2019-10-20 NOTE — ED Triage Notes (Signed)
Pt states her doctor told her to come to the ED to have a pint of blood; pt state she feels dizzy

## 2019-10-20 NOTE — Discharge Instructions (Addendum)
Lab work shows you had low hemoglobin.  We have given you a blood transfusion and lab work looks reassuring.  I want you continue taking your medications as prescribed.  I want you to follow-up with your primary care doctor for further evaluation management of your anemia.  I want to come back to the emergency department if you develop chest pain, shortness of breath, severe abdominal pain, uncontrolled nausea, vomiting, diarrhea as these symptoms require further evaluation management.

## 2019-10-20 NOTE — ED Provider Notes (Signed)
Patient was received at handoff from Alfredia Client [facility] due to shift change.  She received patient from Zettie Cooley, MD who initially evaluated the patient please see her note for full detail.  In short patient was sent to the emergency department by her PCP due to critically low hemoglobin and complaint of dizziness. Patient has significant medical history of stroke, A. fib, hypertension, diabetes, CKD on anticoags.  She states she was feeling somewhat dizzy earlier today but after the transfusion she is feeling much better.  She denies headache, fever, chills, shortness of breath, chest pain, abdominal pain, pedal edema.  patient was admitted to the hospital on 07/19 for acute on chronic anemia and decompensated CHF.  She is provided a unit of blood as well as IV Lasix and was discharged home with PCP follow-up. Work-up includes CBC showing normocytic anemia, hyperglycemia of 3.1. Physical Exam  BP (!) 124/54   Pulse 78   Temp 98.6 F (37 C) (Oral)   Resp 14   Ht 5\' 7"  (1.702 m)   Wt 84.4 kg   SpO2 100%   BMI 29.13 kg/m   Physical Exam Vitals and nursing note reviewed.  Constitutional:      General: She is not in acute distress.    Appearance: She is not ill-appearing.  HENT:     Head: Normocephalic and atraumatic.     Nose: No congestion.     Mouth/Throat:     Mouth: Mucous membranes are moist.     Pharynx: Oropharynx is clear.  Eyes:     General: No scleral icterus. Cardiovascular:     Rate and Rhythm: Normal rate and regular rhythm.     Pulses: Normal pulses.     Heart sounds: No murmur heard.  No friction rub. No gallop.   Pulmonary:     Effort: No respiratory distress.     Breath sounds: No wheezing, rhonchi or rales.  Abdominal:     General: There is no distension.     Tenderness: There is no abdominal tenderness. There is no guarding.  Musculoskeletal:        General: No swelling.  Skin:    General: Skin is warm and dry.     Capillary Refill: Capillary refill takes  less than 2 seconds.     Findings: No rash.  Neurological:     Mental Status: She is alert.  Psychiatric:        Mood and Affect: Mood normal.     ED Course/Procedures     Procedures  MDM  I have personally reviewed all imaging, labs and have interpreted them.  Patient presents due to abnormal labs anemia reported by her PCP.  Patient is alert and oriented, did not appear to be in acute distress, vital signs reassuring.  Patient's lung sounds were assessed, clear bilaterally, abdomen was nontender no acute abdomen noted on exam, no pedal edema noted.  Patient received 1 unit of blood and had a follow-up H&H which reveals hemoglobin of 8.7 and HCT 30.6.  I suspect patient's anemia is secondary to chronic diseases AKI.  Low suspicion for GI bleed or acute abdomen requiring surgical intervention as patient abdomen was nontender to palpation, no acute abdomen noted on exam, she denies abdominal pain, tolerating p.o. without difficulty.  Low suspicion for systemic infection as patient was nontoxic-appearing, vital signs reassuring, no obvious source of infection noted on exam.  Low suspicion for cardiac abnormality  as patient denies chest pain, shortness of breath, no signs  of hypoperfusion or fluid overload noted.  Patient appears to be resting comfortably showing no acute signs stress.  Vital signs stable does not meet criteria to be admitted to the hospital.  Likely patient has anemia secondary to chronic diseases.  Recommends patient follows up with primary care provider for further evaluation.  Patient discussed with attending who agrees assessment plan.  Patient was given at home care as well strict return precautions.  Patient verbalized that she understood and agreed to plan.       Marcello Fennel, PA-C 10/20/19 2141    Varney Biles, MD 10/21/19 [phone]

## 2019-10-20 NOTE — ED Provider Notes (Signed)
Care of the patient was assumed from Dr. Maudie Mercury, resident; see this physician's note for complete history of present illness, review of systems, and physical exam.  Briefly, the patient is a 86 y.o. female who presented to the ED with dizziness from anemia.  Patient was sent here for blood transfusion from PCP.  Dizziness is somewhat chronic.  Denies any shortness of breath or chest pain.  Does report having needed transfusions in the past.  Patient is on Eliquis.   Plan at time of handoff:  Awaiting blood transfusion, then discharge.    Physical Exam  BP 124/69 (BP Location: Right Arm)   Pulse 84   Temp 98 F (36.7 C) (Oral)   Resp 16   Ht 5\' 7"  (1.702 m)   Wt 84.4 kg   SpO2 98%   BMI 29.13 kg/m   ED Course/Procedures     Procedures  MDM  Patient is an 86 year old female presenting for blood transfusion from PCP.  Hemoglobin today 6.9.  Patient is feeling dizzy, no other anemic symptoms currently.  No respiratory symptoms.  Patient normally has hemoglobin around 8.  Point-of-care occult blood negative.  At shift change patient is still receiving transfusion. Pt care was handed off to W. Faulkner PA-C at 700.  Complete history and physical and current plan have been communicated.  Please refer to their note for the remainder of ED care and ultimate disposition.   I discussed this case with my attending physician who cosigned this note including patient's presenting symptoms, physical exam, and planned diagnostics and interventions. Attending physician stated agreement with plan or made changes to plan which were implemented.   Attending physician assessed patient at bedside.       Alfredia Client, PA-C 10/20/19 1900    Elnora Morrison, MD 10/21/19 [phone]

## 2019-10-20 NOTE — Telephone Encounter (Signed)
-----   Message from Shirley Friar, PA-C sent at 10/19/2019 10:13 PM EDT ----- Her hemoglobin is critically low and she should report to the ER in the morning.

## 2019-10-20 NOTE — ED Notes (Addendum)
Date and time results received: 10/20/19 1535  Test: Hgb Critical Value: 6.9  Name of Provider Notified: Zavitz   Orders Received? Or Actions Taken?: n/a

## 2019-10-20 NOTE — Telephone Encounter (Signed)
Spoke with Amy Wiggins per DPR and informed her that her grandmothers labs came back abnormal and her Hemoglobin has dropped back to the critically low levels that she was at when she was admitted in the hosptial in July. Amy Wiggins informed me that her grandmother spoke with a hematologist last week and he stated that at that time she did not need blood and that the patient should return next week for repeat labs.  I told her that she is more then welcome to contact the hematologist and consult but her grandmother presented in the office yesterday with an unsteady gate and complaining of dizziness x 1 week and based on that and lab results that we obtain yesterday per Jonni Sanger Tillery's recommendation, the patient should report to the emergency room for management.   Amy Wiggins is aware and agreeable.

## 2019-10-21 LAB — TYPE AND SCREEN
ABO/RH(D): O POS
Antibody Screen: NEGATIVE
Unit division: 0

## 2019-10-21 LAB — BPAM RBC
Blood Product Expiration Date: 202109292359
ISSUE DATE / TIME: 202108251657
Unit Type and Rh: 5100

## 2019-10-27 ENCOUNTER — Ambulatory Visit (HOSPITAL_COMMUNITY): Payer: Medicare HMO | Admitting: Hematology

## 2019-10-28 ENCOUNTER — Inpatient Hospital Stay (HOSPITAL_COMMUNITY): Payer: Medicare HMO | Attending: Adult Health | Admitting: Hematology

## 2019-10-28 ENCOUNTER — Other Ambulatory Visit: Payer: Self-pay

## 2019-10-28 VITALS — BP 111/44 | HR 74 | Temp 96.8°F | Resp 18 | Wt 182.8 lb

## 2019-10-28 DIAGNOSIS — R42 Dizziness and giddiness: Secondary | ICD-10-CM | POA: Diagnosis not present

## 2019-10-28 DIAGNOSIS — I1310 Hypertensive heart and chronic kidney disease without heart failure, with stage 1 through stage 4 chronic kidney disease, or unspecified chronic kidney disease: Secondary | ICD-10-CM | POA: Insufficient documentation

## 2019-10-28 DIAGNOSIS — I10 Essential (primary) hypertension: Secondary | ICD-10-CM | POA: Diagnosis not present

## 2019-10-28 DIAGNOSIS — I4891 Unspecified atrial fibrillation: Secondary | ICD-10-CM | POA: Insufficient documentation

## 2019-10-28 DIAGNOSIS — Z7901 Long term (current) use of anticoagulants: Secondary | ICD-10-CM | POA: Diagnosis not present

## 2019-10-28 DIAGNOSIS — Z79899 Other long term (current) drug therapy: Secondary | ICD-10-CM | POA: Diagnosis not present

## 2019-10-28 DIAGNOSIS — D649 Anemia, unspecified: Secondary | ICD-10-CM

## 2019-10-28 DIAGNOSIS — I5033 Acute on chronic diastolic (congestive) heart failure: Secondary | ICD-10-CM | POA: Diagnosis not present

## 2019-10-28 DIAGNOSIS — D631 Anemia in chronic kidney disease: Secondary | ICD-10-CM | POA: Diagnosis not present

## 2019-10-28 DIAGNOSIS — Z87891 Personal history of nicotine dependence: Secondary | ICD-10-CM | POA: Diagnosis not present

## 2019-10-28 DIAGNOSIS — E785 Hyperlipidemia, unspecified: Secondary | ICD-10-CM | POA: Insufficient documentation

## 2019-10-28 DIAGNOSIS — E119 Type 2 diabetes mellitus without complications: Secondary | ICD-10-CM | POA: Diagnosis not present

## 2019-10-28 DIAGNOSIS — F329 Major depressive disorder, single episode, unspecified: Secondary | ICD-10-CM | POA: Insufficient documentation

## 2019-10-28 DIAGNOSIS — N184 Chronic kidney disease, stage 4 (severe): Secondary | ICD-10-CM | POA: Diagnosis not present

## 2019-10-28 DIAGNOSIS — J479 Bronchiectasis, uncomplicated: Secondary | ICD-10-CM | POA: Diagnosis not present

## 2019-10-28 DIAGNOSIS — Z8673 Personal history of transient ischemic attack (TIA), and cerebral infarction without residual deficits: Secondary | ICD-10-CM | POA: Diagnosis not present

## 2019-10-28 DIAGNOSIS — D509 Iron deficiency anemia, unspecified: Secondary | ICD-10-CM | POA: Diagnosis not present

## 2019-10-28 MED ORDER — CYANOCOBALAMIN 1000 MCG/ML IJ SOLN
INTRAMUSCULAR | Status: AC
  Filled 2019-10-28: qty 1

## 2019-10-28 MED ORDER — CYANOCOBALAMIN 1000 MCG/ML IJ SOLN
1000.0000 ug | Freq: Once | INTRAMUSCULAR | Status: AC
  Administered 2019-10-28: 1000 ug via INTRAMUSCULAR

## 2019-10-28 NOTE — Progress Notes (Signed)
Amy Wiggins, [place] [address]   CLINIC:  Medical Oncology/Hematology  PCP:  Asencion Noble, MD [address] / [place] [address]  [phone]  REASON FOR VISIT:  Follow-up for normocytic anemia  PRIOR THERAPY: None  CURRENT THERAPY: Intermittent iron and blood transfusions  INTERVAL HISTORY:  Amy Wiggins, a 86 y.o. female, returns for routine follow-up for her normocytic anemia. Unknown was last seen on 10/07/2019.  Today she is accompanied by her caregiver, Amy Wiggins. She reports that she went to [facility] after [name] told her she needed blood and had a unit of blood transfused on 8/25. She reports that she did not feel a difference after getting the transfusion.   REVIEW OF SYSTEMS:  Review of Systems  Constitutional: Positive for fatigue (severe). Negative for appetite change.  All other systems reviewed and are negative.   PAST MEDICAL/SURGICAL HISTORY:  Past Medical History:  Diagnosis Date  . Chronic anticoagulation   . Depression   . Diabetes mellitus   . Hyperlipidemia   . Hypertension   . PAF (paroxysmal atrial fibrillation) ([place])   . PFO (patent foramen ovale)   . Stroke ([place])    TIA's x 2   Past Surgical History:  Procedure Laterality Date  . COLONOSCOPY    . COLONOSCOPY N/A 09/09/2014   Procedure: COLONOSCOPY;  Surgeon: Rogene Houston, MD;  Location: [facility] ENDO SUITE;  Service: Endoscopy;  Laterality: N/A;  1010 - moved to 7:30 - Ann to notify  . EP IMPLANTABLE DEVICE N/A 06/07/2015   Procedure: Loop Recorder Insertion;  Surgeon: Deboraha Sprang, MD;  Location: [place] CV LAB;  Service: Cardiovascular;  Laterality: N/A;  . TEE WITHOUT CARDIOVERSION N/A 06/07/2015   Procedure: TRANSESOPHAGEAL ECHOCARDIOGRAM (TEE);  Surgeon: Thayer Headings, MD;  Location: [facility] ENDOSCOPY;  Service: Cardiovascular;  Laterality: N/A;    SOCIAL HISTORY:  Social History   Socioeconomic History  . Marital status: Widowed      Spouse name: Not on file  . Number of children: 2  . Years of education: Not on file  . Highest education level: Not on file  Occupational History  . Occupation: retired  Tobacco Use  . Smoking status: Former Smoker    Years: 10.00  . Smokeless tobacco: Never Used  Vaping Use  . Vaping Use: Never used  Substance and Sexual Activity  . Alcohol use: No  . Drug use: No  . Sexual activity: Never  Other Topics Concern  . Not on file  Social History Narrative  . Not on file   Social Determinants of Health   Financial Resource Strain:   . Difficulty of Paying Living Expenses: Not on file  Food Insecurity:   . Worried About Charity fundraiser in the Last Year: Not on file  . Ran Out of Food in the Last Year: Not on file  Transportation Needs: No Transportation Needs  . Lack of Transportation (Medical): No  . Lack of Transportation (Non-Medical): No  Physical Activity:   . Days of Exercise per Week: Not on file  . Minutes of Exercise per Session: Not on file  Stress:   . Feeling of Stress : Not on file  Social Connections:   . Frequency of Communication with Friends and Family: Not on file  . Frequency of Social Gatherings with Friends and Family: Not on file  . Attends Religious Services: Not on file  . Active Member of Clubs or  Organizations: Not on file  . Attends Archivist Meetings: Not on file  . Marital Status: Not on file  Intimate Partner Violence:   . Fear of Current or Ex-Partner: Not on file  . Emotionally Abused: Not on file  . Physically Abused: Not on file  . Sexually Abused: Not on file    FAMILY HISTORY:  Family History  Problem Relation Age of Onset  . Stroke Father   . Dementia Sister   . Atrial fibrillation Sister   . Parkinson's disease Sister   . Heart attack Daughter   . Diabetes Son   . Hypertension Son     CURRENT MEDICATIONS:  Current Outpatient Medications  Medication Sig Dispense Refill  . acetaminophen (TYLENOL) 500  MG tablet Take 1,000 mg by mouth at bedtime. may also take 2 tablets during the day as needed for pain    . amiodarone (PACERONE) 200 MG tablet Take 1 tablet (200 mg total) by mouth daily. 30 tablet 0  . buPROPion (WELLBUTRIN XL) 300 MG 24 hr tablet Take 1 tablet (300 mg total) by mouth daily. 30 tablet 0  . citalopram (CELEXA) 20 MG tablet Take 1 tablet (20 mg total) by mouth daily. 30 tablet 0  . diltiazem (CARDIZEM CD) 120 MG 24 hr capsule Take 1 capsule (120 mg total) by mouth daily. 30 capsule 0  . ELIQUIS 2.5 MG TABS tablet Take 1 tablet (2.5 mg total) by mouth 2 (two) times daily. 60 tablet 0  . insulin aspart (NOVOLOG FLEXPEN) 100 UNIT/ML FlexPen Inject 10 Units into the skin 3 (three) times daily with meals. insulin pen; 100 unit/mL (3 mL); amt: 10 units; subcutaneous Special Instructions: admin 5 units for CBG >150 Before Meals 08:00 AM, 11:30 AM, 05:00 PM 15 mL 0  . insulin glargine (LANTUS) 100 UNIT/ML injection Inject 0.3 mLs (30 Units total) into the skin at bedtime. 15 mL 0  . ipratropium-albuterol (DUONEB) 0.5-2.5 (3) MG/3ML SOLN Take 3 mLs by nebulization every 6 (six) hours. 360 mL 0  . Magnesium Oxide 250 MG TABS Take 2 tablets (500 mg total) by mouth every evening. 60 tablet 0  . mirtazapine (REMERON) 15 MG tablet Take 15 mg by mouth at bedtime.    . Multiple Vitamin (MULTIVITAMIN WITH MINERALS) TABS tablet Take 1 tablet by mouth at bedtime. For supplement    . Multiple Vitamins-Minerals (PRESERVISION AREDS 2) CAPS Take 1 capsule by mouth 2 (two) times daily.    . OXYGEN Inhale 2 L into the lungs continuous.    . pantoprazole (PROTONIX) 40 MG tablet Take 1 tablet (40 mg total) by mouth daily. For acid reflux/gerd 30 tablet 0  . potassium chloride SA (KLOR-CON) 20 MEQ tablet Take 1 tablet (20 mEq total) by mouth daily. 30 tablet 0  . torsemide (DEMADEX) 20 MG tablet Take 40mg  in am and 20mg  in the evening. 90 tablet 1  . traZODone (DESYREL) 150 MG tablet Take 0.5 tablets (75 mg  total) by mouth at bedtime. 0.5 tablet to = 75 mg 15 tablet 0   No current facility-administered medications for this visit.    ALLERGIES:  No Known Allergies  PHYSICAL EXAM:  Performance status (ECOG): 2 - Symptomatic, <50% confined to bed  Vitals:   10/28/19 1540  BP: (!) 111/44  Pulse: 74  Resp: 18  Temp: (!) 96.8 F (36 C)  SpO2: 100%   Wt Readings from Last 3 Encounters:  10/28/19 182 lb 12.8 oz (82.9 kg)  10/20/19 186  lb (84.4 kg)  10/19/19 186 lb (84.4 kg)   Physical Exam Vitals reviewed.  Constitutional:      Appearance: Normal appearance.     Interventions: Nasal cannula in place.  Cardiovascular:     Rate and Rhythm: Normal rate and regular rhythm.     Pulses: Normal pulses.     Heart sounds: Normal heart sounds.  Pulmonary:     Effort: Pulmonary effort is normal.     Breath sounds: Normal breath sounds.  Neurological:     General: No focal deficit present.     Mental Status: She is alert and oriented to person, place, and time.  Psychiatric:        Mood and Affect: Mood normal.        Behavior: Behavior normal.     LABORATORY DATA:  I have reviewed the labs as listed.  CBC Latest Ref Rng & Units 10/20/2019 10/20/2019 10/19/2019  WBC 4.0 - 10.5 K/uL - 8.0 8.6  Hemoglobin 12.0 - 15.0 g/dL 8.7(L) 6.9(LL) 6.9(LL)  Hematocrit 36 - 46 % 30.6(L) 25.1(L) 23.3(L)  Platelets 150 - 400 K/uL - 384 361   CMP Latest Ref Rng & Units 10/19/2019 09/13/2019 09/12/2019  Glucose 65 - 99 mg/dL 189(H) 114(H) 251(H)  BUN 8 - 27 mg/dL 50(H) 48(H) 47(H)  Creatinine 0.57 - 1.00 mg/dL 3.04(H) 3.22(H) 3.05(H)  Sodium 134 - 144 mmol/L 134 138 136  Potassium 3.5 - 5.2 mmol/L 4.2 3.4(L) 3.6  Chloride 96 - 106 mmol/L 93(L) 88(L) 88(L)  CO2 20 - 29 mmol/L 28 38(H) 37(H)  Calcium 8.7 - 10.3 mg/dL 9.4 9.4 9.5  Total Protein 6.0 - 8.5 g/dL - - -  Total Bilirubin 0.0 - 1.2 mg/dL - - -  Alkaline Phos 39 - 117 IU/L - - -  AST 0 - 40 IU/L - - -  ALT 0 - 32 IU/L - - -      Component  Value Date/Time   RBC 3.06 (L) 10/20/2019 1332   MCV 82.0 10/20/2019 1332   MCV 76 (L) 10/19/2019 1250   MCH 22.5 (L) 10/20/2019 1332   MCHC 27.5 (L) 10/20/2019 1332   RDW 16.9 (H) 10/20/2019 1332   RDW 15.2 10/19/2019 1250   LYMPHSABS 1.1 10/19/2019 1250   MONOABS 0.7 10/07/2019 1558   EOSABS 0.5 (H) 10/19/2019 1250   BASOSABS 0.1 10/19/2019 1250    DIAGNOSTIC IMAGING:  I have independently reviewed the scans and discussed with the patient. No results found.   ASSESSMENT:  1.  Normocytic anemia: -Recent hospital admission with hemoglobin of 6.7 on 09/09/2019, received 1 unit of PRBC. -Ferrlecit 125 mg on 08/20/2019 and 08/02/2019. -Colonoscopy on 04/04/2009 at [facility] with 3 small polyps ablated via cold biopsy, few small diverticula in the sigmoid colon. -Colonoscopy on 09/09/2014 with tubular adenoma in the cecum removed. -CT chest without contrast on 07/31/2019 showed extensive irregular bilateral groundglass airspace opacity, most conspicuous in the right upper lobe. -Likely etiology from CKD and iron deficiency.   PLAN:  1.  Normocytic anemia: -Last transfusion was on 10/20/2019 for hemoglobin of 6.9. -We reviewed labs which showed normal B12 but methylmalonic acid was elevated indicating deficiency.  We will give her B12 shot today and told her to start taking 1 mg B12 tablet daily.  Copper and folic acid was normal. -Normocytic anemia from CKD and relative iron deficiency. -I have recommended Feraheme weekly x2.  We discussed side effects in detail.  She does not require any daily meds. -  I plan to see her back in 6 weeks for follow-up.  2.  CKD stage IV: -Creatinine ranges around 3.0.  3.  Atrial fibrillation: -Continue Eliquis 2.5 mg twice daily.  Orders placed this encounter:  No orders of the defined types were placed in this encounter.    Derek Jack, MD [place] [phone]   I, Milinda Antis, am acting as a scribe for Dr.  Sanda Linger.  I, Derek Jack MD, have reviewed the above documentation for accuracy and completeness, and I agree with the above.

## 2019-10-28 NOTE — Patient Instructions (Addendum)
[place] at [facility] Discharge Instructions  You were seen today by Dr. Delton Coombes. He went over your recent results. You received your vitamin B12 injection today; purchase some vitamin B12 over the counter and take 1 mg daily. You will be scheduled for 2 iron infusions one week apart. Dr. Delton Coombes will see you back in 6 weeks for labs and follow up.   Thank you for choosing [place] at [facility] to provide your oncology and hematology care.  To afford each patient quality time with our provider, please arrive at least 15 minutes before your scheduled appointment time.   If you have a lab appointment with the [facility] please come in thru the Main Entrance and check in at the main information desk  You need to re-schedule your appointment should you arrive 10 or more minutes late.  We strive to give you quality time with our providers, and arriving late affects you and other patients whose appointments are after yours.  Also, if you no show three or more times for appointments you may be dismissed from the clinic at the providers discretion.     Again, thank you for choosing [facility].  Our hope is that these requests will decrease the amount of time that you wait before being seen by our physicians.       _____________________________________________________________  Should you have questions after your visit to [facility], please contact our office at [phone] between the hours of 8:00 a.m. and 4:30 p.m.  Voicemails left after 4:00 p.m. will not be returned until the following business day.  For prescription refill requests, have your pharmacy contact our office and allow 72 hours.    Cancer Center Support Programs:   > Cancer Support Group  2nd Tuesday of the month 1pm-2pm, Journey Room

## 2019-11-02 DIAGNOSIS — N184 Chronic kidney disease, stage 4 (severe): Secondary | ICD-10-CM | POA: Diagnosis not present

## 2019-11-02 DIAGNOSIS — E1122 Type 2 diabetes mellitus with diabetic chronic kidney disease: Secondary | ICD-10-CM | POA: Diagnosis not present

## 2019-11-02 DIAGNOSIS — D649 Anemia, unspecified: Secondary | ICD-10-CM | POA: Diagnosis not present

## 2019-11-06 DIAGNOSIS — D509 Iron deficiency anemia, unspecified: Secondary | ICD-10-CM | POA: Insufficient documentation

## 2019-11-08 ENCOUNTER — Other Ambulatory Visit: Payer: Self-pay

## 2019-11-08 ENCOUNTER — Inpatient Hospital Stay (HOSPITAL_COMMUNITY): Payer: Medicare HMO

## 2019-11-08 ENCOUNTER — Encounter (HOSPITAL_COMMUNITY): Payer: Self-pay

## 2019-11-08 VITALS — BP 118/61 | HR 73 | Temp 97.3°F | Resp 16

## 2019-11-08 DIAGNOSIS — D631 Anemia in chronic kidney disease: Secondary | ICD-10-CM | POA: Diagnosis not present

## 2019-11-08 DIAGNOSIS — E119 Type 2 diabetes mellitus without complications: Secondary | ICD-10-CM | POA: Diagnosis not present

## 2019-11-08 DIAGNOSIS — I4891 Unspecified atrial fibrillation: Secondary | ICD-10-CM | POA: Diagnosis not present

## 2019-11-08 DIAGNOSIS — D509 Iron deficiency anemia, unspecified: Secondary | ICD-10-CM | POA: Diagnosis not present

## 2019-11-08 DIAGNOSIS — I1310 Hypertensive heart and chronic kidney disease without heart failure, with stage 1 through stage 4 chronic kidney disease, or unspecified chronic kidney disease: Secondary | ICD-10-CM | POA: Diagnosis not present

## 2019-11-08 DIAGNOSIS — N184 Chronic kidney disease, stage 4 (severe): Secondary | ICD-10-CM | POA: Diagnosis not present

## 2019-11-08 DIAGNOSIS — F329 Major depressive disorder, single episode, unspecified: Secondary | ICD-10-CM | POA: Diagnosis not present

## 2019-11-08 DIAGNOSIS — E785 Hyperlipidemia, unspecified: Secondary | ICD-10-CM | POA: Diagnosis not present

## 2019-11-08 DIAGNOSIS — I10 Essential (primary) hypertension: Secondary | ICD-10-CM | POA: Diagnosis not present

## 2019-11-08 DIAGNOSIS — E1122 Type 2 diabetes mellitus with diabetic chronic kidney disease: Secondary | ICD-10-CM

## 2019-11-08 MED ORDER — LORATADINE 10 MG PO TABS
10.0000 mg | ORAL_TABLET | Freq: Once | ORAL | Status: AC
  Administered 2019-11-08: 10 mg via ORAL
  Filled 2019-11-08: qty 1

## 2019-11-08 MED ORDER — SODIUM CHLORIDE 0.9 % IV SOLN
510.0000 mg | Freq: Once | INTRAVENOUS | Status: AC
  Administered 2019-11-08: 510 mg via INTRAVENOUS
  Filled 2019-11-08: qty 510

## 2019-11-08 MED ORDER — ACETAMINOPHEN 325 MG PO TABS
650.0000 mg | ORAL_TABLET | Freq: Once | ORAL | Status: AC
  Administered 2019-11-08: 650 mg via ORAL
  Filled 2019-11-08: qty 2

## 2019-11-08 MED ORDER — SODIUM CHLORIDE 0.9 % IV SOLN: Freq: Once | INTRAVENOUS | Status: AC

## 2019-11-08 NOTE — Progress Notes (Signed)
Patient presents today for Feraheme infusion. Vital signs stable. Patient has no complaints of any significant changes since her last visit.   Feraheme  given today per MD orders. Tolerated infusion without adverse affects. Vital signs stable. No complaints at this time. Discharged from clinic ambulatory. F/U with [facility] as scheduled.

## 2019-11-08 NOTE — Patient Instructions (Signed)
[facility] at [facility]  Discharge Instructions:   _______________________________________________________________  Thank you for choosing [facility] at [facility] to provide your oncology and hematology care.  To afford each patient quality time with our providers, please arrive at least 15 minutes before your scheduled appointment.  You need to re-schedule your appointment if you arrive 10 or more minutes late.  We strive to give you quality time with our providers, and arriving late affects you and other patients whose appointments are after yours.  Also, if you no show three or more times for appointments you may be dismissed from the clinic.  Again, thank you for choosing [facility]. Our hope is that these requests will allow you access to exceptional care and in a timely manner. _______________________________________________________________  If you have questions after your visit, please contact our office at [phone] between the hours of 8:30 a.m. and 5:00 p.m. Voicemails left after 4:30 p.m. will not be returned until the following business day. _______________________________________________________________  For prescription refill requests, have your pharmacy contact our office. _______________________________________________________________  Recommendations made by the consultant and any test results will be sent to your referring physician. _______________________________________________________________ 

## 2019-11-11 DIAGNOSIS — J9601 Acute respiratory failure with hypoxia: Secondary | ICD-10-CM | POA: Diagnosis not present

## 2019-11-11 DIAGNOSIS — I639 Cerebral infarction, unspecified: Secondary | ICD-10-CM | POA: Diagnosis not present

## 2019-11-11 DIAGNOSIS — I5032 Chronic diastolic (congestive) heart failure: Secondary | ICD-10-CM | POA: Diagnosis not present

## 2019-11-11 DIAGNOSIS — N184 Chronic kidney disease, stage 4 (severe): Secondary | ICD-10-CM | POA: Diagnosis not present

## 2019-11-12 ENCOUNTER — Ambulatory Visit (HOSPITAL_COMMUNITY)
Admission: RE | Admit: 2019-11-12 | Discharge: 2019-11-12 | Disposition: A | Discharge status: Home / Self Care | Payer: Medicare HMO | Source: Ambulatory Visit | Attending: Internal Medicine | Admitting: Internal Medicine

## 2019-11-12 ENCOUNTER — Other Ambulatory Visit: Payer: Self-pay

## 2019-11-12 DIAGNOSIS — I509 Heart failure, unspecified: Secondary | ICD-10-CM | POA: Diagnosis not present

## 2019-11-12 LAB — BLOOD GAS, ARTERIAL
Acid-Base Excess: 6.9 mmol/L — ABNORMAL HIGH (ref 0.0–2.0)
Bicarbonate: 30.5 mmol/L — ABNORMAL HIGH (ref 20.0–28.0)
FIO2: 28
O2 Saturation: 88.8 %
Patient temperature: 36.9
pCO2 arterial: 47.7 mmHg (ref 32.0–48.0)
pH, Arterial: 7.431 (ref 7.350–7.450)
pO2, Arterial: 56.1 mmHg — ABNORMAL LOW (ref 83.0–108.0)

## 2019-11-15 ENCOUNTER — Encounter (HOSPITAL_COMMUNITY): Payer: Self-pay

## 2019-11-15 ENCOUNTER — Other Ambulatory Visit: Payer: Self-pay

## 2019-11-15 ENCOUNTER — Inpatient Hospital Stay (HOSPITAL_COMMUNITY): Payer: Medicare HMO

## 2019-11-15 VITALS — BP 114/47 | HR 73 | Temp 97.3°F | Resp 18

## 2019-11-15 DIAGNOSIS — D509 Iron deficiency anemia, unspecified: Secondary | ICD-10-CM | POA: Diagnosis not present

## 2019-11-15 DIAGNOSIS — F329 Major depressive disorder, single episode, unspecified: Secondary | ICD-10-CM | POA: Diagnosis not present

## 2019-11-15 DIAGNOSIS — D631 Anemia in chronic kidney disease: Secondary | ICD-10-CM | POA: Diagnosis not present

## 2019-11-15 DIAGNOSIS — E1122 Type 2 diabetes mellitus with diabetic chronic kidney disease: Secondary | ICD-10-CM

## 2019-11-15 DIAGNOSIS — I1310 Hypertensive heart and chronic kidney disease without heart failure, with stage 1 through stage 4 chronic kidney disease, or unspecified chronic kidney disease: Secondary | ICD-10-CM | POA: Diagnosis not present

## 2019-11-15 DIAGNOSIS — E785 Hyperlipidemia, unspecified: Secondary | ICD-10-CM | POA: Diagnosis not present

## 2019-11-15 DIAGNOSIS — I10 Essential (primary) hypertension: Secondary | ICD-10-CM | POA: Diagnosis not present

## 2019-11-15 DIAGNOSIS — N184 Chronic kidney disease, stage 4 (severe): Secondary | ICD-10-CM | POA: Diagnosis not present

## 2019-11-15 DIAGNOSIS — E119 Type 2 diabetes mellitus without complications: Secondary | ICD-10-CM | POA: Diagnosis not present

## 2019-11-15 DIAGNOSIS — I4891 Unspecified atrial fibrillation: Secondary | ICD-10-CM | POA: Diagnosis not present

## 2019-11-15 MED ORDER — ACETAMINOPHEN 325 MG PO TABS
650.0000 mg | ORAL_TABLET | Freq: Once | ORAL | Status: AC
  Administered 2019-11-15: 650 mg via ORAL
  Filled 2019-11-15: qty 2

## 2019-11-15 MED ORDER — LORATADINE 10 MG PO TABS
10.0000 mg | ORAL_TABLET | Freq: Once | ORAL | Status: AC
  Administered 2019-11-15: 10 mg via ORAL
  Filled 2019-11-15: qty 1

## 2019-11-15 MED ORDER — SODIUM CHLORIDE 0.9 % IV SOLN: Freq: Once | INTRAVENOUS | Status: AC

## 2019-11-15 MED ORDER — SODIUM CHLORIDE 0.9 % IV SOLN
510.0000 mg | Freq: Once | INTRAVENOUS | Status: AC
  Administered 2019-11-15: 510 mg via INTRAVENOUS
  Filled 2019-11-15: qty 510

## 2019-11-15 NOTE — Patient Instructions (Signed)
[facility] at [facility]  Discharge Instructions:   _______________________________________________________________  Thank you for choosing [facility] at [facility] to provide your oncology and hematology care.  To afford each patient quality time with our providers, please arrive at least 15 minutes before your scheduled appointment.  You need to re-schedule your appointment if you arrive 10 or more minutes late.  We strive to give you quality time with our providers, and arriving late affects you and other patients whose appointments are after yours.  Also, if you no show three or more times for appointments you may be dismissed from the clinic.  Again, thank you for choosing [facility] at [facility]. Our hope is that these requests will allow you access to exceptional care and in a timely manner. _______________________________________________________________  If you have questions after your visit, please contact our office at [phone] between the hours of 8:30 a.m. and 5:00 p.m. Voicemails left after 4:30 p.m. will not be returned until the following business day. _______________________________________________________________  For prescription refill requests, have your pharmacy contact our office. _______________________________________________________________  Recommendations made by the consultant and any test results will be sent to your referring physician. _______________________________________________________________ 

## 2019-11-15 NOTE — Progress Notes (Signed)
Feraheme given today per MD orders. Tolerated infusion without adverse affects. Vital signs stable. No complaints at this time. Discharged from clinic via wheel chair accompanied by sitter in stable condition. Alert and oriented x 3. F/U with [facility] as scheduled.

## 2019-11-27 DIAGNOSIS — R42 Dizziness and giddiness: Secondary | ICD-10-CM | POA: Diagnosis not present

## 2019-11-27 DIAGNOSIS — I5033 Acute on chronic diastolic (congestive) heart failure: Secondary | ICD-10-CM | POA: Diagnosis not present

## 2019-11-27 DIAGNOSIS — J479 Bronchiectasis, uncomplicated: Secondary | ICD-10-CM | POA: Diagnosis not present

## 2019-11-30 ENCOUNTER — Other Ambulatory Visit: Payer: Self-pay

## 2019-11-30 ENCOUNTER — Encounter: Payer: Self-pay | Admitting: Emergency Medicine

## 2019-11-30 ENCOUNTER — Ambulatory Visit
Admission: EM | Admit: 2019-11-30 | Discharge: 2019-11-30 | Disposition: A | Discharge status: Home / Self Care | Payer: Medicare HMO | Attending: Emergency Medicine | Admitting: Emergency Medicine

## 2019-11-30 DIAGNOSIS — M109 Gout, unspecified: Secondary | ICD-10-CM | POA: Diagnosis not present

## 2019-11-30 MED ORDER — SULFAMETHOXAZOLE-TRIMETHOPRIM 800-160 MG PO TABS: 1.0000 | ORAL_TABLET | Freq: Two times a day (BID) | ORAL | 0 refills | Status: AC

## 2019-11-30 MED ORDER — PREDNISONE 10 MG PO TABS: 10.0000 mg | ORAL_TABLET | Freq: Every day | ORAL | 0 refills | Status: AC

## 2019-11-30 NOTE — ED Triage Notes (Signed)
Pain, redness and swelling to RT great toe x 2 days. Denies any injury

## 2019-11-30 NOTE — ED Provider Notes (Signed)
[place]   [id number] 11/30/19 Arrival Time: 37   Chief Complaint  Patient presents with   Toe Pain     SUBJECTIVE: History from: patient.  Amy Wiggins is a 86 y.o. female who presented to the urgent care with a complaint of redness and pain and swelling to right great toe for the past 2 to 3 days.  Denies any precipitating event.  She localizes the pain to the right great toe.  She describes the pain as constant and achy.  She has tried OTC medications without relief.  Her symptoms are made worse with ROM.  She r denies similar symptoms in the past.  Denies chills, fever, nausea, vomiting, diarrhea.  ROS: As per HPI.  All other pertinent ROS negative.     Past Medical History:  Diagnosis Date   Chronic anticoagulation    Depression    Diabetes mellitus    Hyperlipidemia    Hypertension    PAF (paroxysmal atrial fibrillation) (HCC)    PFO (patent foramen ovale)    Stroke (HCC)    TIA's x 2   Past Surgical History:  Procedure Laterality Date   COLONOSCOPY     COLONOSCOPY N/A 09/09/2014   Procedure: COLONOSCOPY;  Surgeon: Rogene Houston, MD;  [facility];  Service: Endoscopy;  Laterality: N/A;  1010 - moved to 7:30 - Ann to notify   EP IMPLANTABLE DEVICE N/A 06/07/2015   Procedure: Loop Recorder Insertion;  Surgeon: Deboraha Sprang, MD;  Location: [facility];  Service: Cardiovascular;  Laterality: N/A;   TEE WITHOUT CARDIOVERSION N/A 06/07/2015   Procedure: TRANSESOPHAGEAL ECHOCARDIOGRAM (TEE);  Surgeon: Thayer Headings, MD;  Location: [facility];  Service: Cardiovascular;  Laterality: N/A;   No Known Allergies No current facility-administered medications on file prior to encounter.   Current Outpatient Medications on File Prior to Encounter  Medication Sig Dispense Refill   acetaminophen (TYLENOL) 500 MG tablet Take 1,000 mg by mouth at bedtime. may also take 2 tablets during the day as needed for pain      amiodarone (PACERONE) 200 MG tablet Take 1 tablet (200 mg total) by mouth daily. 30 tablet 0   buPROPion (WELLBUTRIN XL) 300 MG 24 hr tablet Take 1 tablet (300 mg total) by mouth daily. 30 tablet 0   citalopram (CELEXA) 20 MG tablet Take 1 tablet (20 mg total) by mouth daily. 30 tablet 0   diltiazem (CARDIZEM CD) 120 MG 24 hr capsule Take 1 capsule (120 mg total) by mouth daily. 30 capsule 0   ELIQUIS 2.5 MG TABS tablet Take 1 tablet (2.5 mg total) by mouth 2 (two) times daily. 60 tablet 0   insulin aspart (NOVOLOG FLEXPEN) 100 UNIT/ML FlexPen Inject 10 Units into the skin 3 (three) times daily with meals. insulin pen; 100 unit/mL (3 mL); amt: 10 units; subcutaneous Special Instructions: admin 5 units for CBG >150 Before Meals 08:00 AM, 11:30 AM, 05:00 PM 15 mL 0   insulin glargine (LANTUS) 100 UNIT/ML injection Inject 0.3 mLs (30 Units total) into the skin at bedtime. 15 mL 0   ipratropium-albuterol (DUONEB) 0.5-2.5 (3) MG/3ML SOLN Take 3 mLs by nebulization every 6 (six) hours. 360 mL 0   Magnesium Oxide 250 MG TABS Take 2 tablets (500 mg total) by mouth every evening. 60 tablet 0   mirtazapine (REMERON) 15 MG tablet Take 15 mg by mouth at bedtime.     Multiple Vitamin (MULTIVITAMIN WITH MINERALS) TABS tablet Take 1  tablet by mouth at bedtime. For supplement     Multiple Vitamins-Minerals (PRESERVISION AREDS 2) CAPS Take 1 capsule by mouth 2 (two) times daily.     OXYGEN Inhale 2 L into the lungs continuous.     pantoprazole (PROTONIX) 40 MG tablet Take 1 tablet (40 mg total) by mouth daily. For acid reflux/gerd 30 tablet 0   potassium chloride SA (KLOR-CON) 20 MEQ tablet Take 1 tablet (20 mEq total) by mouth daily. 30 tablet 0   torsemide (DEMADEX) 20 MG tablet Take 40mg  in am and 20mg  in the evening. 90 tablet 1   traZODone (DESYREL) 150 MG tablet Take 0.5 tablets (75 mg total) by mouth at bedtime. 0.5 tablet to = 75 mg 15 tablet 0   Social History   Socioeconomic History    Marital status: Widowed    Spouse name: Not on file   Number of children: 2   Years of education: Not on file   Highest education level: Not on file  Occupational History   Occupation: retired  Tobacco Use   Smoking status: Former Smoker    Years: 10.00   Smokeless tobacco: Never Used  Scientific laboratory technician Use: Never used  Substance and Sexual Activity   Alcohol use: No   Drug use: No   Sexual activity: Never  Other Topics Concern   Not on file  Social History Narrative   Not on file   Social Determinants of Health   Financial Resource Strain:    Difficulty of Paying Living Expenses: Not on file  Food Insecurity:    Worried About Charity fundraiser in the Last Year: Not on file   [employer] of Food in the Last Year: Not on file  Transportation Needs: No Transportation Needs   Lack of Transportation (Medical): No   Lack of Transportation (Non-Medical): No  Physical Activity:    Days of Exercise per Week: Not on file   Minutes of Exercise per Session: Not on file  Stress:    Feeling of Stress : Not on file  Social Connections:    Frequency of Communication with Friends and Family: Not on file   Frequency of Social Gatherings with Friends and Family: Not on file   Attends Religious Services: Not on file   Active Member of Clubs or Organizations: Not on file   Attends Archivist Meetings: Not on file   Marital Status: Not on file  Intimate Partner Violence:    Fear of Current or Ex-Partner: Not on file   Emotionally Abused: Not on file   Physically Abused: Not on file   Sexually Abused: Not on file   Family History  Problem Relation Age of Onset   Stroke Father    Dementia Sister    Atrial fibrillation Sister    Parkinson's disease Sister    Heart attack Daughter    Diabetes Son    Hypertension Son     OBJECTIVE:  Vitals:   11/30/19 1508 11/30/19 1509  BP:  113/61  Pulse:  72  Resp:  18  Temp:  98.2 F (36.8  C)  TempSrc:  Oral  SpO2:  98%  Weight: 177 lb (80.3 kg)   Height: 5\' 4"  (1.626 m)      Physical Exam Vitals and nursing note reviewed.  Constitutional:      General: She is not in acute distress.    Appearance: Normal appearance. She is normal weight. She is not ill-appearing, toxic-appearing or  diaphoretic.  HENT:     Head: Normocephalic.  Cardiovascular:     Rate and Rhythm: Normal rate and regular rhythm.     Pulses: Normal pulses.     Heart sounds: Normal heart sounds. No murmur heard.  No friction rub. No gallop.   Pulmonary:     Effort: Pulmonary effort is normal. No respiratory distress.     Breath sounds: Normal breath sounds. No stridor. No wheezing, rhonchi or rales.  Chest:     Chest wall: No tenderness.  Musculoskeletal:        General: Tenderness present.     Right foot: Swelling and tenderness present.     Left foot: Normal.     Comments: The right foot is with obvious deformity when compared to the left foot.  Warmth, tenderness redness, and swelling present on the right big toe.  Limited range of motion due to pain.  Neurovascular status intact.  Skin:    General: Skin is warm.     Findings: Erythema present.     Comments: reddness  Neurological:     Mental Status: She is alert and oriented to person, place, and time.     LABS:  No results found for this or any previous visit (from the past 24 hour(s)).   ASSESSMENT & PLAN:  1. Acute gout involving toe of right foot, unspecified cause     Meds ordered this encounter  Medications   predniSONE (DELTASONE) 10 MG tablet    Sig: Take 1 tablet (10 mg total) by mouth daily for 7 days.    Dispense:  7 tablet    Refill:  0   sulfamethoxazole-trimethoprim (BACTRIM DS) 800-160 MG tablet    Sig: Take 1 tablet by mouth 2 (two) times daily for 7 days.    Dispense:  14 tablet    Refill:  0   Patient is stable at discharge.  Symptom is likely from gout but there is a possibility of skin cellulitis.   Low-dose prednisone was prescribed as she is diabetic and Keflex was prescribed to cover the possibility of skin cellulitis   Discharge instructions  Prescribed prednisone take as directed and to completion Take OTC Tylenol as needed for pain Follow-up with PCP Return or go to the ED if you have any new or worsening symptoms      Reviewed expectations re: course of current medical issues. Questions answered. Outlined signs and symptoms indicating need for more acute intervention. Patient verbalized understanding. After Visit Summary given.         Emerson Monte, FNP 11/30/19 1600

## 2019-11-30 NOTE — Discharge Instructions (Signed)
Prescribed prednisone take as directed and to completion Take OTC Tylenol as needed for pain Follow-up with PCP Return or go to the ED if you have any new or worsening symptoms

## 2019-12-03 DIAGNOSIS — T380X5D Adverse effect of glucocorticoids and synthetic analogues, subsequent encounter: Secondary | ICD-10-CM | POA: Diagnosis not present

## 2019-12-03 DIAGNOSIS — F339 Major depressive disorder, recurrent, unspecified: Secondary | ICD-10-CM | POA: Diagnosis not present

## 2019-12-03 DIAGNOSIS — Z6830 Body mass index (BMI) 30.0-30.9, adult: Secondary | ICD-10-CM | POA: Diagnosis not present

## 2019-12-03 DIAGNOSIS — H353290 Exudative age-related macular degeneration, unspecified eye, stage unspecified: Secondary | ICD-10-CM | POA: Diagnosis not present

## 2019-12-03 DIAGNOSIS — Z7952 Long term (current) use of systemic steroids: Secondary | ICD-10-CM | POA: Diagnosis not present

## 2019-12-03 DIAGNOSIS — I480 Paroxysmal atrial fibrillation: Secondary | ICD-10-CM | POA: Diagnosis not present

## 2019-12-03 DIAGNOSIS — I110 Hypertensive heart disease with heart failure: Secondary | ICD-10-CM | POA: Diagnosis not present

## 2019-12-03 DIAGNOSIS — Z794 Long term (current) use of insulin: Secondary | ICD-10-CM | POA: Diagnosis not present

## 2019-12-03 DIAGNOSIS — E1165 Type 2 diabetes mellitus with hyperglycemia: Secondary | ICD-10-CM | POA: Diagnosis not present

## 2019-12-03 DIAGNOSIS — I509 Heart failure, unspecified: Secondary | ICD-10-CM | POA: Diagnosis not present

## 2019-12-04 DIAGNOSIS — Z794 Long term (current) use of insulin: Secondary | ICD-10-CM | POA: Diagnosis not present

## 2019-12-04 DIAGNOSIS — Z7952 Long term (current) use of systemic steroids: Secondary | ICD-10-CM | POA: Diagnosis not present

## 2019-12-04 DIAGNOSIS — E1165 Type 2 diabetes mellitus with hyperglycemia: Secondary | ICD-10-CM | POA: Diagnosis not present

## 2019-12-04 DIAGNOSIS — T380X5D Adverse effect of glucocorticoids and synthetic analogues, subsequent encounter: Secondary | ICD-10-CM | POA: Diagnosis not present

## 2019-12-07 DIAGNOSIS — E119 Type 2 diabetes mellitus without complications: Secondary | ICD-10-CM | POA: Diagnosis not present

## 2019-12-11 DIAGNOSIS — I5032 Chronic diastolic (congestive) heart failure: Secondary | ICD-10-CM | POA: Diagnosis not present

## 2019-12-11 DIAGNOSIS — J9601 Acute respiratory failure with hypoxia: Secondary | ICD-10-CM | POA: Diagnosis not present

## 2019-12-11 DIAGNOSIS — I639 Cerebral infarction, unspecified: Secondary | ICD-10-CM | POA: Diagnosis not present

## 2019-12-11 DIAGNOSIS — N184 Chronic kidney disease, stage 4 (severe): Secondary | ICD-10-CM | POA: Diagnosis not present

## 2019-12-13 ENCOUNTER — Other Ambulatory Visit: Payer: Self-pay

## 2019-12-13 ENCOUNTER — Inpatient Hospital Stay (HOSPITAL_COMMUNITY): Payer: Medicare HMO | Attending: Hematology

## 2019-12-13 DIAGNOSIS — E785 Hyperlipidemia, unspecified: Secondary | ICD-10-CM | POA: Diagnosis not present

## 2019-12-13 DIAGNOSIS — I129 Hypertensive chronic kidney disease with stage 1 through stage 4 chronic kidney disease, or unspecified chronic kidney disease: Secondary | ICD-10-CM | POA: Insufficient documentation

## 2019-12-13 DIAGNOSIS — I509 Heart failure, unspecified: Secondary | ICD-10-CM | POA: Diagnosis not present

## 2019-12-13 DIAGNOSIS — Z87891 Personal history of nicotine dependence: Secondary | ICD-10-CM | POA: Diagnosis not present

## 2019-12-13 DIAGNOSIS — I480 Paroxysmal atrial fibrillation: Secondary | ICD-10-CM | POA: Diagnosis not present

## 2019-12-13 DIAGNOSIS — Z79899 Other long term (current) drug therapy: Secondary | ICD-10-CM | POA: Diagnosis not present

## 2019-12-13 DIAGNOSIS — I10 Essential (primary) hypertension: Secondary | ICD-10-CM | POA: Insufficient documentation

## 2019-12-13 DIAGNOSIS — K5900 Constipation, unspecified: Secondary | ICD-10-CM | POA: Insufficient documentation

## 2019-12-13 DIAGNOSIS — R630 Anorexia: Secondary | ICD-10-CM | POA: Diagnosis not present

## 2019-12-13 DIAGNOSIS — N184 Chronic kidney disease, stage 4 (severe): Secondary | ICD-10-CM | POA: Insufficient documentation

## 2019-12-13 DIAGNOSIS — F329 Major depressive disorder, single episode, unspecified: Secondary | ICD-10-CM | POA: Diagnosis not present

## 2019-12-13 DIAGNOSIS — M109 Gout, unspecified: Secondary | ICD-10-CM | POA: Insufficient documentation

## 2019-12-13 DIAGNOSIS — D509 Iron deficiency anemia, unspecified: Secondary | ICD-10-CM | POA: Insufficient documentation

## 2019-12-13 DIAGNOSIS — N179 Acute kidney failure, unspecified: Secondary | ICD-10-CM | POA: Diagnosis not present

## 2019-12-13 DIAGNOSIS — D631 Anemia in chronic kidney disease: Secondary | ICD-10-CM | POA: Diagnosis not present

## 2019-12-13 DIAGNOSIS — Z8673 Personal history of transient ischemic attack (TIA), and cerebral infarction without residual deficits: Secondary | ICD-10-CM | POA: Insufficient documentation

## 2019-12-13 DIAGNOSIS — Z794 Long term (current) use of insulin: Secondary | ICD-10-CM | POA: Diagnosis not present

## 2019-12-13 DIAGNOSIS — E1122 Type 2 diabetes mellitus with diabetic chronic kidney disease: Secondary | ICD-10-CM | POA: Diagnosis not present

## 2019-12-13 DIAGNOSIS — Z7901 Long term (current) use of anticoagulants: Secondary | ICD-10-CM | POA: Diagnosis not present

## 2019-12-13 DIAGNOSIS — E119 Type 2 diabetes mellitus without complications: Secondary | ICD-10-CM | POA: Diagnosis not present

## 2019-12-13 DIAGNOSIS — D649 Anemia, unspecified: Secondary | ICD-10-CM

## 2019-12-13 LAB — IRON AND TIBC
Iron: 38 ug/dL (ref 28–170)
Saturation Ratios: 13 % (ref 10.4–31.8)
TIBC: 290 ug/dL (ref 250–450)
UIBC: 252 ug/dL

## 2019-12-13 LAB — CBC WITH DIFFERENTIAL/PLATELET
Abs Immature Granulocytes: 0.05 10*3/uL (ref 0.00–0.07)
Basophils Absolute: 0.1 10*3/uL (ref 0.0–0.1)
Basophils Relative: 1 %
Eosinophils Absolute: 0.7 10*3/uL — ABNORMAL HIGH (ref 0.0–0.5)
Eosinophils Relative: 9 %
HCT: 28.9 % — ABNORMAL LOW (ref 36.0–46.0)
Hemoglobin: 8.6 g/dL — ABNORMAL LOW (ref 12.0–15.0)
Immature Granulocytes: 1 %
Lymphocytes Relative: 12 %
Lymphs Abs: 0.9 10*3/uL (ref 0.7–4.0)
MCH: 27.3 pg (ref 26.0–34.0)
MCHC: 29.8 g/dL — ABNORMAL LOW (ref 30.0–36.0)
MCV: 91.7 fL (ref 80.0–100.0)
Monocytes Absolute: 0.8 10*3/uL (ref 0.1–1.0)
Monocytes Relative: 10 %
Neutro Abs: 5.1 10*3/uL (ref 1.7–7.7)
Neutrophils Relative %: 67 %
Platelets: 324 10*3/uL (ref 150–400)
RBC: 3.15 MIL/uL — ABNORMAL LOW (ref 3.87–5.11)
RDW: 18.9 % — ABNORMAL HIGH (ref 11.5–15.5)
WBC: 7.5 10*3/uL (ref 4.0–10.5)
nRBC: 0 % (ref 0.0–0.2)

## 2019-12-13 LAB — COMPREHENSIVE METABOLIC PANEL
ALT: 69 U/L — ABNORMAL HIGH (ref 0–44)
AST: 48 U/L — ABNORMAL HIGH (ref 15–41)
Albumin: 2.8 g/dL — ABNORMAL LOW (ref 3.5–5.0)
Alkaline Phosphatase: 194 U/L — ABNORMAL HIGH (ref 38–126)
Anion gap: 12 (ref 5–15)
BUN: 43 mg/dL — ABNORMAL HIGH (ref 8–23)
CO2: 29 mmol/L (ref 22–32)
Calcium: 9 mg/dL (ref 8.9–10.3)
Chloride: 95 mmol/L — ABNORMAL LOW (ref 98–111)
Creatinine, Ser: 3.57 mg/dL — ABNORMAL HIGH (ref 0.44–1.00)
GFR, Estimated: 11 mL/min — ABNORMAL LOW (ref >60)
Glucose, Bld: 216 mg/dL — ABNORMAL HIGH (ref 70–99)
Potassium: 4.2 mmol/L (ref 3.5–5.1)
Sodium: 136 mmol/L (ref 135–145)
Total Bilirubin: 0.5 mg/dL (ref 0.3–1.2)
Total Protein: 6 g/dL — ABNORMAL LOW (ref 6.5–8.1)

## 2019-12-13 LAB — FERRITIN: Ferritin: 135 ng/mL (ref 11–307)

## 2019-12-14 ENCOUNTER — Inpatient Hospital Stay (HOSPITAL_BASED_OUTPATIENT_CLINIC_OR_DEPARTMENT_OTHER): Payer: Medicare HMO | Admitting: Hematology

## 2019-12-14 VITALS — BP 107/40 | HR 77 | Temp 97.2°F | Resp 18

## 2019-12-14 DIAGNOSIS — D631 Anemia in chronic kidney disease: Secondary | ICD-10-CM | POA: Diagnosis not present

## 2019-12-14 DIAGNOSIS — Z79899 Other long term (current) drug therapy: Secondary | ICD-10-CM | POA: Diagnosis not present

## 2019-12-14 DIAGNOSIS — D649 Anemia, unspecified: Secondary | ICD-10-CM | POA: Diagnosis not present

## 2019-12-14 DIAGNOSIS — K5900 Constipation, unspecified: Secondary | ICD-10-CM | POA: Diagnosis not present

## 2019-12-14 DIAGNOSIS — M109 Gout, unspecified: Secondary | ICD-10-CM | POA: Diagnosis not present

## 2019-12-14 DIAGNOSIS — N184 Chronic kidney disease, stage 4 (severe): Secondary | ICD-10-CM | POA: Diagnosis not present

## 2019-12-14 DIAGNOSIS — F329 Major depressive disorder, single episode, unspecified: Secondary | ICD-10-CM | POA: Diagnosis not present

## 2019-12-14 DIAGNOSIS — I129 Hypertensive chronic kidney disease with stage 1 through stage 4 chronic kidney disease, or unspecified chronic kidney disease: Secondary | ICD-10-CM | POA: Diagnosis not present

## 2019-12-14 DIAGNOSIS — D509 Iron deficiency anemia, unspecified: Secondary | ICD-10-CM | POA: Diagnosis not present

## 2019-12-14 DIAGNOSIS — R630 Anorexia: Secondary | ICD-10-CM | POA: Diagnosis not present

## 2019-12-14 MED ORDER — COLCHICINE 0.6 MG PO TABS: 0.6000 mg | ORAL_TABLET | Freq: Every day | ORAL | 0 refills | Status: DC

## 2019-12-14 NOTE — Patient Instructions (Signed)
[place] at [facility] Discharge Instructions  You were seen today by Dr. Delton Coombes. He went over your recent results. You will be prescribed colchicine to take 1 tablet daily for a couple days to see if your joint swelling will come down. You will be scheduled to receive 2 iron infusions 1 week apart. Your next appointment will be in 2 months with the nurse practitioner for labs and follow up.   Thank you for choosing [place] at [facility] to provide your oncology and hematology care.  To afford each patient quality time with our provider, please arrive at least 15 minutes before your scheduled appointment time.   If you have a lab appointment with the [facility] please come in thru the Main Entrance and check in at the main information desk  You need to re-schedule your appointment should you arrive 10 or more minutes late.  We strive to give you quality time with our providers, and arriving late affects you and other patients whose appointments are after yours.  Also, if you no show three or more times for appointments you may be dismissed from the clinic at the providers discretion.     Again, thank you for choosing [facility].  Our hope is that these requests will decrease the amount of time that you wait before being seen by our physicians.       _____________________________________________________________  Should you have questions after your visit to [facility], please contact our office at [phone] between the hours of 8:00 a.m. and 4:30 p.m.  Voicemails left after 4:00 p.m. will not be returned until the following business day.  For prescription refill requests, have your pharmacy contact our office and allow 72 hours.    Cancer Center Support Programs:   > Cancer Support Group  2nd Tuesday of the month 1pm-2pm, Journey Room

## 2019-12-14 NOTE — Progress Notes (Signed)
[place], [place] [address]   CLINIC:  Medical Oncology/Hematology  PCP:  Asencion Noble, MD [address] / [place] [address]  [phone]  REASON FOR VISIT:  Follow-up for normocytic anemia  PRIOR THERAPY: None  CURRENT THERAPY: Intermittent Feraheme and blood transfusions  INTERVAL HISTORY:  [name], a 86 y.o. female, returns for routine follow-up for her normocytic anemia. Amy Wiggins was last seen on 10/28/2019.  Today she reports feeling okay. She reports that her energy levels did not improve after her last Feraheme; her last Feraheme was on 9/13 and 11/15/2019. She reports having constipation but denies having hematochezia, melena or hematuria. She reports having SOB and dizziness when walking, but denies CP. She continues having redness and swelling in her right toe and she finished taking prednisone and Bactrim for it on 10/12; she has a history of gout. She is able to walk. She denies having F/C.   REVIEW OF SYSTEMS:  Review of Systems  Constitutional: Positive for appetite change (95%) and fatigue (50%). Negative for chills and fever.  Respiratory: Positive for shortness of breath (w/ exertion).   Cardiovascular: Negative for chest pain.  Gastrointestinal: Positive for constipation. Negative for blood in stool.  Genitourinary: Negative for hematuria.   Neurological: Positive for dizziness (when walking).  All other systems reviewed and are negative.   PAST MEDICAL/SURGICAL HISTORY:  Past Medical History:  Diagnosis Date  . Chronic anticoagulation   . Depression   . Diabetes mellitus   . Hyperlipidemia   . Hypertension   . PAF (paroxysmal atrial fibrillation) ([place])   . PFO (patent foramen ovale)   . Stroke ([place])    TIA's x 2   Past Surgical History:  Procedure Laterality Date  . COLONOSCOPY    . COLONOSCOPY N/A 09/09/2014   Procedure: COLONOSCOPY;  Surgeon: Rogene [place], MD;  [facility];  Service: Endoscopy;  Laterality: N/A;  1010 - moved to 7:30 - Ann to notify  . EP IMPLANTABLE DEVICE N/A 06/07/2015   Procedure: Loop Recorder Insertion;  Surgeon: Deboraha Sprang, MD;  Location: [facility];  Service: Cardiovascular;  Laterality: N/A;  . TEE WITHOUT CARDIOVERSION N/A 06/07/2015   Procedure: TRANSESOPHAGEAL ECHOCARDIOGRAM (TEE);  Surgeon: Thayer Headings, MD;  Location: [facility] ENDOSCOPY;  Service: Cardiovascular;  Laterality: N/A;    SOCIAL HISTORY:  Social History   Socioeconomic History  . Marital status: Widowed    Spouse name: Not on file  . Number of children: 2  . Years of education: Not on file  . Highest education level: Not on file  Occupational History  . Occupation: retired  Tobacco Use  . Smoking status: Former Smoker    Years: 10.00  . Smokeless tobacco: Never Used  Vaping Use  . Vaping Use: Never used  Substance and Sexual Activity  . Alcohol use: No  . Drug use: No  . Sexual activity: Never  Other Topics Concern  . Not on file  Social History Narrative  . Not on file   Social Determinants of Health   Financial Resource Strain:   . Difficulty of Paying Living Expenses: Not on file  Food Insecurity:   . Worried About Charity fundraiser in the Last Year: Not on file  . Ran Out of Food in the Last Year: Not on file  Transportation Needs: No Transportation Needs  . Lack of Transportation (Medical): No  . Lack of Transportation (  Non-Medical): No  Physical Activity:   . Days of Exercise per Week: Not on file  . Minutes of Exercise per Session: Not on file  Stress:   . Feeling of Stress : Not on file  Social Connections:   . Frequency of Communication with Friends and Family: Not on file  . Frequency of Social Gatherings with Friends and Family: Not on file  . Attends Religious Services: Not on file  . Active Member of Clubs or Organizations: Not on file  . Attends Archivist Meetings: Not on file  . Marital Status: Not  on file  Intimate Partner Violence:   . Fear of Current or Ex-Partner: Not on file  . Emotionally Abused: Not on file  . Physically Abused: Not on file  . Sexually Abused: Not on file    FAMILY HISTORY:  Family History  Problem Relation Age of Onset  . Stroke Father   . Dementia Sister   . Atrial fibrillation Sister   . Parkinson's disease Sister   . Heart attack Daughter   . Diabetes Son   . Hypertension Son     CURRENT MEDICATIONS:  Current Outpatient Medications  Medication Sig Dispense Refill  . acetaminophen (TYLENOL) 500 MG tablet Take 1,000 mg by mouth at bedtime. may also take 2 tablets during the day as needed for pain    . amiodarone (PACERONE) 200 MG tablet Take 1 tablet (200 mg total) by mouth daily. 30 tablet 0  . atorvastatin (LIPITOR) 20 MG tablet Take 20 mg by mouth daily.    Marland Kitchen buPROPion (WELLBUTRIN XL) 300 MG 24 hr tablet Take 1 tablet (300 mg total) by mouth daily. 30 tablet 0  . citalopram (CELEXA) 20 MG tablet Take 1 tablet (20 mg total) by mouth daily. 30 tablet 0  . diltiazem (CARDIZEM CD) 120 MG 24 hr capsule Take 1 capsule (120 mg total) by mouth daily. 30 capsule 0  . DROPLET PEN NEEDLES 32G X 4 MM MISC     . ELIQUIS 2.5 MG TABS tablet Take 1 tablet (2.5 mg total) by mouth 2 (two) times daily. 60 tablet 0  . insulin aspart (NOVOLOG FLEXPEN) 100 UNIT/ML FlexPen Inject 10 Units into the skin 3 (three) times daily with meals. insulin pen; 100 unit/mL (3 mL); amt: 10 units; subcutaneous Special Instructions: admin 5 units for CBG >150 Before Meals 08:00 AM, 11:30 AM, 05:00 PM 15 mL 0  . insulin glargine (LANTUS) 100 UNIT/ML injection Inject 0.3 mLs (30 Units total) into the skin at bedtime. 15 mL 0  . ipratropium-albuterol (DUONEB) 0.5-2.5 (3) MG/3ML SOLN Take 3 mLs by nebulization every 6 (six) hours. 360 mL 0  . Lancet Devices (TRUEDRAW LANCING DEVICE) MISC     . Magnesium Oxide 250 MG TABS Take 2 tablets (500 mg total) by mouth every evening. 60 tablet 0  .  mirtazapine (REMERON) 15 MG tablet Take 15 mg by mouth at bedtime.    . Multiple Vitamin (MULTIVITAMIN WITH MINERALS) TABS tablet Take 1 tablet by mouth at bedtime. For supplement    . Multiple Vitamins-Minerals (PRESERVISION AREDS 2) CAPS Take 1 capsule by mouth 2 (two) times daily.    . OXYGEN Inhale 2 L into the lungs continuous.    . pantoprazole (PROTONIX) 40 MG tablet Take 1 tablet (40 mg total) by mouth daily. For acid reflux/gerd 30 tablet 0  . potassium chloride SA (KLOR-CON) 20 MEQ tablet Take 1 tablet (20 mEq total) by mouth daily. 30 tablet 0  .  torsemide (DEMADEX) 20 MG tablet Take 40mg  in am and 20mg  in the evening. 90 tablet 1  . traZODone (DESYREL) 150 MG tablet Take 0.5 tablets (75 mg total) by mouth at bedtime. 0.5 tablet to = 75 mg 15 tablet 0  . TRUEplus Lancets 33G MISC      No current facility-administered medications for this visit.    ALLERGIES:  No Known Allergies  PHYSICAL EXAM:  Performance status (ECOG): 2 - Symptomatic, <50% confined to bed  Vitals:   12/14/19 1529  BP: (!) 107/40  Pulse: 77  Resp: 18  Temp: (!) 97.2 F (36.2 C)  SpO2: 97%   Wt Readings from Last 3 Encounters:  11/30/19 177 lb (80.3 kg)  10/28/19 182 lb 12.8 oz (82.9 kg)  10/20/19 186 lb (84.4 kg)   Physical Exam Vitals reviewed.  Constitutional:      Appearance: Normal appearance.  Cardiovascular:     Rate and Rhythm: Normal rate and regular rhythm.     Pulses: Normal pulses.     Heart sounds: Murmur (systolic ejection murmur over aorta) heard.   Pulmonary:     Effort: Pulmonary effort is normal.     Breath sounds: Normal breath sounds.  Musculoskeletal:     Right foot: Swelling (R toe) present.  Skin:    Findings: Erythema (over R toe) present.  Neurological:     General: No focal deficit present.     Mental Status: She is alert and oriented to person, place, and time.  Psychiatric:        Mood and Affect: Mood normal.        Behavior: Behavior normal.      LABORATORY DATA:  I have reviewed the labs as listed.  CBC Latest Ref Rng & Units 12/13/2019 10/20/2019 10/20/2019  WBC 4.0 - 10.5 K/uL 7.5 - 8.0  Hemoglobin 12.0 - 15.0 g/dL 8.6(L) 8.7(L) 6.9(LL)  Hematocrit 36 - 46 % 28.9(L) 30.6(L) 25.1(L)  Platelets 150 - 400 K/uL 324 - 384   CMP Latest Ref Rng & Units 12/13/2019 10/19/2019 09/13/2019  Glucose 70 - 99 mg/dL 216(H) 189(H) 114(H)  BUN 8 - 23 mg/dL 43(H) 50(H) 48(H)  Creatinine 0.44 - 1.00 mg/dL 3.57(H) 3.04(H) 3.22(H)  Sodium 135 - 145 mmol/L 136 134 138  Potassium 3.5 - 5.1 mmol/L 4.2 4.2 3.4(L)  Chloride 98 - 111 mmol/L 95(L) 93(L) 88(L)  CO2 22 - 32 mmol/L 29 28 38(H)  Calcium 8.9 - 10.3 mg/dL 9.0 9.4 9.4  Total Protein 6.5 - 8.1 g/dL 6.0(L) - -  Total Bilirubin 0.3 - 1.2 mg/dL 0.5 - -  Alkaline Phos 38 - 126 U/L 194(H) - -  AST 15 - 41 U/L 48(H) - -  ALT 0 - 44 U/L 69(H) - -      Component Value Date/Time   RBC 3.15 (L) 12/13/2019 1234   MCV 91.7 12/13/2019 1234   MCV 76 (L) 10/19/2019 1250   MCH 27.3 12/13/2019 1234   MCHC 29.8 (L) 12/13/2019 1234   RDW 18.9 (H) 12/13/2019 1234   RDW 15.2 10/19/2019 1250   LYMPHSABS 0.9 12/13/2019 1234   LYMPHSABS 1.1 10/19/2019 1250   MONOABS 0.8 12/13/2019 1234   EOSABS 0.7 (H) 12/13/2019 1234   EOSABS 0.5 (H) 10/19/2019 1250   BASOSABS 0.1 12/13/2019 1234   BASOSABS 0.1 10/19/2019 1250   Lab Results  Component Value Date   TIBC 290 12/13/2019   TIBC 331 07/31/2019   FERRITIN 135 12/13/2019   FERRITIN 36 07/31/2019  IRONPCTSAT 13 12/13/2019   IRONPCTSAT 32 (H) 07/31/2019    DIAGNOSTIC IMAGING:  I have independently reviewed the scans and discussed with the patient. No results found.   ASSESSMENT:  1. Normocytic anemia: -Recent hospital admission with hemoglobin of 6.7 on 09/09/2019, received 1 unit of PRBC. -Ferrlecit 125 mg on 08/20/2019 and 08/02/2019. -Colonoscopy on 04/04/2009 at [facility] with 3 small polyps ablated via cold biopsy, few small diverticula in  the sigmoid colon. -Colonoscopy on 09/09/2014 with tubular adenoma in the cecum removed. -CT chest without contrast on 07/31/2019 showed extensive irregular bilateral groundglass airspace opacity, most conspicuous in the right upper lobe. -Likely etiology from CKD and iron deficiency. -Last Feraheme infusion was on 11/08/2019 on 11/15/2019.   PLAN:  1. Normocytic anemia: -Normocytic anemia from CKD and relative iron deficiency. -She reports feeling very tired.  Occasional lightheadedness. -Reviewed labs from 12/13/2019.  Hemoglobin 8.6 and ferritin is 135.  Percent saturation is 13. -I have recommended Feraheme weekly x2. -She will also continue B12 1 tablet daily. -RTC 2 months with repeat labs.  If iron levels are adequate and she continues to be anemic with hemoglobin below 9, consider erythropoiesis stimulating agents.  2. CKD stage IV: -Creatinine today is 3.57.  3. Atrial fibrillation: -Continue Eliquis 2.5 mg twice daily.  Orders placed this encounter:  No orders of the defined types were placed in this encounter.    Derek Jack, MD [place] [phone]   I, Milinda Antis, am acting as a scribe for Dr. Sanda Linger.  I, Derek Jack MD, have reviewed the above documentation for accuracy and completeness, and I agree with the above.

## 2019-12-16 ENCOUNTER — Other Ambulatory Visit: Payer: Self-pay | Admitting: Nephrology

## 2019-12-16 ENCOUNTER — Other Ambulatory Visit (HOSPITAL_COMMUNITY): Payer: Self-pay | Admitting: Nephrology

## 2019-12-16 DIAGNOSIS — N184 Chronic kidney disease, stage 4 (severe): Secondary | ICD-10-CM

## 2019-12-16 DIAGNOSIS — N179 Acute kidney failure, unspecified: Secondary | ICD-10-CM

## 2019-12-20 ENCOUNTER — Inpatient Hospital Stay (HOSPITAL_COMMUNITY): Payer: Medicare HMO

## 2019-12-20 ENCOUNTER — Encounter (HOSPITAL_COMMUNITY): Payer: Self-pay

## 2019-12-20 ENCOUNTER — Other Ambulatory Visit: Payer: Self-pay

## 2019-12-20 VITALS — BP 107/43 | HR 70 | Temp 97.1°F | Resp 17

## 2019-12-20 DIAGNOSIS — N184 Chronic kidney disease, stage 4 (severe): Secondary | ICD-10-CM | POA: Diagnosis not present

## 2019-12-20 DIAGNOSIS — I129 Hypertensive chronic kidney disease with stage 1 through stage 4 chronic kidney disease, or unspecified chronic kidney disease: Secondary | ICD-10-CM | POA: Diagnosis not present

## 2019-12-20 DIAGNOSIS — D509 Iron deficiency anemia, unspecified: Secondary | ICD-10-CM

## 2019-12-20 DIAGNOSIS — K5900 Constipation, unspecified: Secondary | ICD-10-CM | POA: Diagnosis not present

## 2019-12-20 DIAGNOSIS — E1122 Type 2 diabetes mellitus with diabetic chronic kidney disease: Secondary | ICD-10-CM

## 2019-12-20 DIAGNOSIS — R630 Anorexia: Secondary | ICD-10-CM | POA: Diagnosis not present

## 2019-12-20 DIAGNOSIS — D631 Anemia in chronic kidney disease: Secondary | ICD-10-CM | POA: Diagnosis not present

## 2019-12-20 DIAGNOSIS — M109 Gout, unspecified: Secondary | ICD-10-CM | POA: Diagnosis not present

## 2019-12-20 DIAGNOSIS — F329 Major depressive disorder, single episode, unspecified: Secondary | ICD-10-CM | POA: Diagnosis not present

## 2019-12-20 DIAGNOSIS — Z79899 Other long term (current) drug therapy: Secondary | ICD-10-CM | POA: Diagnosis not present

## 2019-12-20 MED ORDER — SODIUM CHLORIDE 0.9 % IV SOLN
510.0000 mg | Freq: Once | INTRAVENOUS | Status: AC
  Administered 2019-12-20: 510 mg via INTRAVENOUS
  Filled 2019-12-20: qty 17

## 2019-12-20 MED ORDER — SODIUM CHLORIDE 0.9 % IV SOLN: Freq: Once | INTRAVENOUS | Status: AC

## 2019-12-20 MED ORDER — ACETAMINOPHEN 325 MG PO TABS
650.0000 mg | ORAL_TABLET | Freq: Once | ORAL | Status: AC
  Administered 2019-12-20: 650 mg via ORAL
  Filled 2019-12-20: qty 2

## 2019-12-20 MED ORDER — LORATADINE 10 MG PO TABS
10.0000 mg | ORAL_TABLET | Freq: Once | ORAL | Status: AC
  Administered 2019-12-20: 10 mg via ORAL
  Filled 2019-12-20: qty 1

## 2019-12-20 NOTE — Patient Instructions (Signed)
[facility] at [facility]  Discharge Instructions:   _______________________________________________________________  Thank you for choosing [facility] at [facility] to provide your oncology and hematology care.  To afford each patient quality time with our providers, please arrive at least 15 minutes before your scheduled appointment.  You need to re-schedule your appointment if you arrive 10 or more minutes late.  We strive to give you quality time with our providers, and arriving late affects you and other patients whose appointments are after yours.  Also, if you no show three or more times for appointments you may be dismissed from the clinic.  Again, thank you for choosing [facility]. Our hope is that these requests will allow you access to exceptional care and in a timely manner. _______________________________________________________________  If you have questions after your visit, please contact our office at [phone] between the hours of 8:30 a.m. and 5:00 p.m. Voicemails left after 4:30 p.m. will not be returned until the following business day. _______________________________________________________________  For prescription refill requests, have your pharmacy contact our office. _______________________________________________________________  Recommendations made by the consultant and any test results will be sent to your referring physician. _______________________________________________________________ 

## 2019-12-20 NOTE — Progress Notes (Signed)
tolerated iron infusion well today without incidence.  Discharged via wheelchair in stable condition.  Vital signs stbale prior to discharge.

## 2019-12-22 ENCOUNTER — Other Ambulatory Visit: Payer: Self-pay

## 2019-12-22 ENCOUNTER — Ambulatory Visit (HOSPITAL_COMMUNITY)
Admission: RE | Admit: 2019-12-22 | Discharge: 2019-12-22 | Disposition: A | Discharge status: Home / Self Care | Payer: Medicare HMO | Source: Ambulatory Visit | Attending: Nephrology | Admitting: Nephrology

## 2019-12-22 DIAGNOSIS — N184 Chronic kidney disease, stage 4 (severe): Secondary | ICD-10-CM | POA: Diagnosis not present

## 2019-12-22 DIAGNOSIS — N281 Cyst of kidney, acquired: Secondary | ICD-10-CM | POA: Diagnosis not present

## 2019-12-22 DIAGNOSIS — N179 Acute kidney failure, unspecified: Secondary | ICD-10-CM | POA: Insufficient documentation

## 2019-12-22 DIAGNOSIS — N189 Chronic kidney disease, unspecified: Secondary | ICD-10-CM | POA: Diagnosis not present

## 2019-12-27 ENCOUNTER — Ambulatory Visit (HOSPITAL_COMMUNITY): Payer: Medicare HMO

## 2019-12-28 DIAGNOSIS — I5033 Acute on chronic diastolic (congestive) heart failure: Secondary | ICD-10-CM | POA: Diagnosis not present

## 2019-12-28 DIAGNOSIS — J479 Bronchiectasis, uncomplicated: Secondary | ICD-10-CM | POA: Diagnosis not present

## 2019-12-28 DIAGNOSIS — R42 Dizziness and giddiness: Secondary | ICD-10-CM | POA: Diagnosis not present

## 2019-12-30 DIAGNOSIS — E119 Type 2 diabetes mellitus without complications: Secondary | ICD-10-CM | POA: Diagnosis not present

## 2019-12-31 ENCOUNTER — Other Ambulatory Visit: Payer: Self-pay

## 2019-12-31 ENCOUNTER — Encounter (HOSPITAL_COMMUNITY): Payer: Self-pay

## 2019-12-31 ENCOUNTER — Inpatient Hospital Stay (HOSPITAL_COMMUNITY): Payer: Medicare HMO | Attending: Adult Health

## 2019-12-31 VITALS — BP 122/50 | HR 72 | Temp 96.9°F | Resp 18

## 2019-12-31 DIAGNOSIS — Z79899 Other long term (current) drug therapy: Secondary | ICD-10-CM | POA: Diagnosis not present

## 2019-12-31 DIAGNOSIS — E1122 Type 2 diabetes mellitus with diabetic chronic kidney disease: Secondary | ICD-10-CM

## 2019-12-31 DIAGNOSIS — D649 Anemia, unspecified: Secondary | ICD-10-CM | POA: Insufficient documentation

## 2019-12-31 DIAGNOSIS — D509 Iron deficiency anemia, unspecified: Secondary | ICD-10-CM

## 2019-12-31 MED ORDER — ACETAMINOPHEN 325 MG PO TABS
650.0000 mg | ORAL_TABLET | Freq: Once | ORAL | Status: AC
  Administered 2019-12-31: 650 mg via ORAL
  Filled 2019-12-31: qty 2

## 2019-12-31 MED ORDER — LORATADINE 10 MG PO TABS
10.0000 mg | ORAL_TABLET | Freq: Once | ORAL | Status: AC
  Administered 2019-12-31: 10 mg via ORAL
  Filled 2019-12-31: qty 1

## 2019-12-31 MED ORDER — SODIUM CHLORIDE 0.9 % IV SOLN: Freq: Once | INTRAVENOUS | Status: AC

## 2019-12-31 MED ORDER — SODIUM CHLORIDE 0.9 % IV SOLN
510.0000 mg | Freq: Once | INTRAVENOUS | Status: AC
  Administered 2019-12-31: 510 mg via INTRAVENOUS
  Filled 2019-12-31: qty 510

## 2019-12-31 NOTE — Progress Notes (Signed)
Feraheme given today per MD orders. Tolerated infusion without adverse affects. Vital signs stable. No complaints at this time. Discharged from clinic via wheel chair in stable condition. Alert and oriented x 3. F/U with [facility] as scheduled.  

## 2019-12-31 NOTE — Patient Instructions (Signed)
[facility]  Discharge Instructions:   _______________________________________________________________  Thank you for choosing  [facility] Center at [facility] to provide your oncology and hematology care.  To afford each patient quality time with our providers, please arrive at least 15 minutes before your scheduled appointment.  You need to re-schedule your appointment if you arrive 10 or more minutes late.  We strive to give you quality time with our providers, and arriving late affects you and other patients whose appointments are after yours.  Also, if you no show three or more times for appointments you may be dismissed from the clinic.  Again, thank you for choosing  Can[facility] at [facility]. Our hope is that these requests will allow you access to exceptional care and in a timely manner. _______________________________________________________________  If you have questions after your visit, please contact our office at [phone] between the hours of 8:30 a.m. and 5:00 p.m. Voicemails left after 4:30 p.m. will not be returned until the following business day. _______________________________________________________________  For prescription refill requests, have your pharmacy contact our office. _______________________________________________________________  Recommendations made by the consultant and any test results will be sent to your referring physician. _______________________________________________________________ 

## 2020-01-03 DIAGNOSIS — I5030 Unspecified diastolic (congestive) heart failure: Secondary | ICD-10-CM | POA: Diagnosis not present

## 2020-01-03 DIAGNOSIS — N184 Chronic kidney disease, stage 4 (severe): Secondary | ICD-10-CM | POA: Diagnosis not present

## 2020-01-03 DIAGNOSIS — E1122 Type 2 diabetes mellitus with diabetic chronic kidney disease: Secondary | ICD-10-CM | POA: Diagnosis not present

## 2020-01-05 ENCOUNTER — Other Ambulatory Visit: Payer: Self-pay

## 2020-01-05 ENCOUNTER — Encounter: Payer: Self-pay | Admitting: Pulmonary Disease

## 2020-01-05 ENCOUNTER — Ambulatory Visit (INDEPENDENT_AMBULATORY_CARE_PROVIDER_SITE_OTHER): Payer: Medicare HMO | Admitting: Pulmonary Disease

## 2020-01-05 VITALS — BP 120/52 | HR 77 | Temp 97.4°F | Ht 64.0 in | Wt 185.0 lb

## 2020-01-05 DIAGNOSIS — J9611 Chronic respiratory failure with hypoxia: Secondary | ICD-10-CM

## 2020-01-05 DIAGNOSIS — R0683 Snoring: Secondary | ICD-10-CM | POA: Diagnosis not present

## 2020-01-05 DIAGNOSIS — J479 Bronchiectasis, uncomplicated: Secondary | ICD-10-CM | POA: Diagnosis not present

## 2020-01-05 NOTE — Progress Notes (Signed)
[facility]  Chief Complaint  Patient presents with  . Consult    sleep consult    Constitutional:  BP (!) 120/52 (BP Location: Right Arm, Cuff Size: Normal)   Pulse 77   Temp (!) 97.4 F (36.3 C) (Other (Comment)) Comment (Src): wrist  Ht 5\' 4"  (1.626 m)   Wt 185 lb (83.9 kg)   SpO2 93% Comment: 2L PulseO2  BMI 31.76 kg/m   Past Medical History:  RSV infection November 2017, Depression, DM, HLD, HTN, PAF, PFO, CVA, Anemia, CKD 4  Past Surgical History:  Her  has a past surgical history that includes Colonoscopy; Colonoscopy (N/A, 09/09/2014); Cardiac catheterization (N/A, 06/07/2015); and TEE without cardioversion (N/A, 06/07/2015).  Brief Summary:  Amy Wiggins is a 86 y.o. female former smoker with dyspnea and snoring.      Subjective:   She quit smoking years ago.  Had several episodes of pneumonia.  Was started on 2 liters oxygen after one of these events, but she can't recall which.  She has changes f bronchiectasis on CT chest.  Has intermittent dry cough and gets winded with exertion.  No recent fever or hemoptysis.  She uses nebulizer but doesn't seem to help.  She has trouble falling asleep and staying asleep.  She snores.  Wakes up frequently to use the bathroom.  Goes to bed at 8 pm.  Falls asleep in an hour.  Gets up at 10 am.  Feels tired in the morning.  She denies morning headache.  She does not use anything to help her fall sleep or stay awake.  She denies sleep walking, sleep talking, bruxism, or nightmares.  There is no history of restless legs.  She denies sleep hallucinations, sleep paralysis, or cataplexy.  The Epworth score is 7 out of 24.    Physical Exam:   Appearance - well kempt, wearing oxygen  ENMT - no sinus tenderness, no oral exudate, no LAN, Mallampati 3 airway, no stridor  Respiratory - equal breath sounds bilaterally, no wheezing or rales  CV - s1s2 regular rate and rhythm, 2/6  systolic murmur  Ext - no clubbing, no edema  Skin - no rashes  Psych - normal mood and affect   Pulmonary testing:    Chest Imaging:   CT chest 07/31/19 >> mild BTX   Sleep Tests:    Cardiac Tests:   Echo 07/28/19 >> EF 60 to 65%, grade 2 DD, severe LA dilation, mod/severe MS  Social History:  She  reports that she has quit smoking. She quit after 10.00 years of use. She has never used smokeless tobacco. She reports that she does not drink alcohol and does not use drugs.  Family History:  Her family history includes Atrial fibrillation in her sister; Dementia in her sister; Diabetes in her son; Heart attack in her daughter; Hypertension in her son; Parkinson's disease in her sister; Stroke in her father.    Labs:   CMP Latest Ref Rng & Units 12/13/2019 10/19/2019 09/13/2019  Glucose 70 - 99 mg/dL 216(H) 189(H) 114(H)  BUN 8 - 23 mg/dL 43(H) 50(H) 48(H)  Creatinine 0.44 - 1.00 mg/dL 3.57(H) 3.04(H) 3.22(H)  Sodium 135 - 145 mmol/L 136 134 138  Potassium 3.5 - 5.1 mmol/L 4.2 4.2 3.4(L)  Chloride 98 - 111 mmol/L 95(L) 93(L) 88(L)  CO2 22 - 32 mmol/L 29 28 38(H)  Calcium 8.9 - 10.3 mg/dL 9.0 9.4 9.4  Total Protein 6.5 - 8.1 g/dL  6.0(L) - -  Total Bilirubin 0.3 - 1.2 mg/dL 0.5 - -  Alkaline Phos 38 - 126 U/L 194(H) - -  AST 15 - 41 U/L 48(H) - -  ALT 0 - 44 U/L 69(H) - -    CBC Latest Ref Rng & Units 12/13/2019 10/20/2019 10/20/2019  WBC 4.0 - 10.5 K/uL 7.5 - 8.0  Hemoglobin 12.0 - 15.0 g/dL 8.6(L) 8.7(L) 6.9(LL)  Hematocrit 36 - 46 % 28.9(L) 30.6(L) 25.1(L)  Platelets 150 - 400 K/uL 324 - 384    ABG    Component Value Date/Time   PHART 7.431 11/12/2019 1500   PCO2ART 47.7 11/12/2019 1500   PO2ART 56.1 (L) 11/12/2019 1500   HCO3 30.5 (H) 11/12/2019 1500   TCO2 30 02/21/2016 1935   O2SAT 88.8 11/12/2019 1500    Lab Results  Component Value Date   TSH 5.325 (H) 10/07/2019    Discussion:  She has snoring, sleep disruption, apnea and daytime sleepiness.  She  has history of CVA, A fib, hypertension, and depression.  I am concerned she could have obstructive sleep apnea.  She has history of smoking and recurrent episodes of pneumonia.  She has been on 2 liters supplemental oxygen.  Recent CT chest showed changes of bronchiectasis likely from prior infections.  Assessment/Plan:   Snoring with excessive daytime sleepiness. - will need to arrange for a in lab sleep study - it is my medical opinion that she requires an in lab sleep study given that she has chronic hypoxic respiratory failure and requires supplemental oxygen  Bronchiectasis. - will arrange for pulmonary function test - continue prn duoneb  Chronic respiratory failure with hypoxia. - suspect this is related to bronchiectasis and possible obstructive lung disease - continue 2 liters oxygen 24/7  Obesity. - discussed how weight can impact sleep and risk for sleep disordered breathing - discussed options to assist with weight loss: combination of diet modification, cardiovascular and strength training exercises  Cardiovascular risk. - had an extensive discussion regarding the adverse health consequences related to untreated sleep disordered breathing - specifically discussed the risks for hypertension, coronary artery disease, cardiac dysrhythmias, cerebrovascular disease, and diabetes - lifestyle modification discussed  Safe driving practices. - discussed how sleep disruption can increase risk of accidents, particularly when driving - safe driving practices were discussed  Therapies for obstructive sleep apnea. - if the sleep study shows significant sleep apnea, then various therapies for treatment were reviewed: CPAP, oral appliance, and surgical interventions     Time Spent Involved in Patient Care on Day of Examination:  32 minutes  Follow up:  Patient Instructions  Will arrange for pulmonary function test and in lab sleep study Will call to arrange for follow up  after sleep study reviewed    Medication List:   Allergies as of 01/05/2020   No Known Allergies     Medication List       Accurate as of January 05, 2020  9:52 AM. If you have any questions, ask your nurse or doctor.        STOP taking these medications   colchicine 0.6 MG tablet Stopped by: Chesley Mires, MD   Mitigare 0.6 MG Caps Generic drug: Colchicine Stopped by: Chesley Mires, MD     TAKE these medications   acetaminophen 500 MG tablet Commonly known as: TYLENOL Take 1,000 mg by mouth at bedtime. may also take 2 tablets during the day as needed for pain   amiodarone 200 MG tablet Commonly known as:  PACERONE Take 1 tablet (200 mg total) by mouth daily.   atorvastatin 20 MG tablet Commonly known as: LIPITOR Take 20 mg by mouth daily.   buPROPion 300 MG 24 hr tablet Commonly known as: WELLBUTRIN XL Take 1 tablet (300 mg total) by mouth daily.   citalopram 20 MG tablet Commonly known as: CELEXA Take 1 tablet (20 mg total) by mouth daily.   diltiazem 120 MG 24 hr capsule Commonly known as: CARDIZEM CD Take 1 capsule (120 mg total) by mouth daily.   Droplet Pen Needles 32G X 4 MM Misc Generic drug: Insulin Pen Needle   Eliquis 2.5 MG Tabs tablet Generic drug: apixaban Take 1 tablet (2.5 mg total) by mouth 2 (two) times daily.   insulin glargine 100 UNIT/ML injection Commonly known as: Lantus Inject 0.3 mLs (30 Units total) into the skin at bedtime.   ipratropium-albuterol 0.5-2.5 (3) MG/3ML Soln Commonly known as: DUONEB Take 3 mLs by nebulization every 6 (six) hours.   Magnesium Oxide 250 MG Tabs Take 2 tablets (500 mg total) by mouth every evening.   mirtazapine 15 MG tablet Commonly known as: REMERON Take 15 mg by mouth at bedtime.   multivitamin with minerals Tabs tablet Take 1 tablet by mouth at bedtime. For supplement   NovoLOG FlexPen 100 UNIT/ML FlexPen Generic drug: insulin aspart Inject 10 Units into the skin 3 (three) times daily  with meals. insulin pen; 100 unit/mL (3 mL); amt: 10 units; subcutaneous Special Instructions: admin 5 units for CBG >150 Before Meals 08:00 AM, 11:30 AM, 05:00 PM   OXYGEN Inhale 2 L into the lungs continuous.   pantoprazole 40 MG tablet Commonly known as: PROTONIX Take 1 tablet (40 mg total) by mouth daily. For acid reflux/gerd   potassium chloride SA 20 MEQ tablet Commonly known as: KLOR-CON Take 1 tablet (20 mEq total) by mouth daily.   PreserVision AREDS 2 Caps Take 1 capsule by mouth 2 (two) times daily.   torsemide 20 MG tablet Commonly known as: DEMADEX Take 40mg  in am and 20mg  in the evening.   traZODone 150 MG tablet Commonly known as: DESYREL Take 0.5 tablets (75 mg total) by mouth at bedtime. 0.5 tablet to = 75 mg   TRUEdraw Lancing Device Misc   TRUEplus Lancets 33G Misc       Signature:  Chesley Mires, MD [place] Pager - [phone] 01/05/2020, 9:52 AM

## 2020-01-05 NOTE — Patient Instructions (Signed)
Will arrange for pulmonary function test and in lab sleep study Will call to arrange for follow up after sleep study reviewed

## 2020-01-11 DIAGNOSIS — N184 Chronic kidney disease, stage 4 (severe): Secondary | ICD-10-CM | POA: Diagnosis not present

## 2020-01-11 DIAGNOSIS — I639 Cerebral infarction, unspecified: Secondary | ICD-10-CM | POA: Diagnosis not present

## 2020-01-11 DIAGNOSIS — I5032 Chronic diastolic (congestive) heart failure: Secondary | ICD-10-CM | POA: Diagnosis not present

## 2020-01-11 DIAGNOSIS — J9601 Acute respiratory failure with hypoxia: Secondary | ICD-10-CM | POA: Diagnosis not present

## 2020-01-12 ENCOUNTER — Telehealth: Payer: Self-pay | Admitting: Pulmonary Disease

## 2020-01-12 NOTE — Telephone Encounter (Signed)
LMTCB x1 for [employer] with [employer].

## 2020-01-12 NOTE — Telephone Encounter (Signed)
Spoke with Claiborne Billings with [employer]. She is needing the pt's OV note faxed to her at [phone]. She is also wanting Dr. Halford Chessman to send an order to them for a Trilogy NIV. Claiborne Billings states that the pt qualifies for this. [employer] has been trying to work with pt's PCP to get the pt this but has not had much luck.  Dr. Halford Chessman - please advise. Thanks.

## 2020-01-12 NOTE — Telephone Encounter (Signed)
I saw her on 01/05/20.  She is to have a sleep study set up to determine if she has sleep apnea.  It is premature to order a Trilogy home ventilator at this time.

## 2020-01-13 NOTE — Telephone Encounter (Signed)
Amy Wiggins with [employer] returning call.  [phone]

## 2020-01-13 NOTE — Telephone Encounter (Signed)
LMTCB for [employer]

## 2020-01-14 NOTE — Telephone Encounter (Signed)
Spoke with Claiborne Billings and notified of response per Dr Halford Chessman  She verbalized understanding  Nothing further needed

## 2020-01-24 DIAGNOSIS — E119 Type 2 diabetes mellitus without complications: Secondary | ICD-10-CM | POA: Diagnosis not present

## 2020-01-24 DIAGNOSIS — Z515 Encounter for palliative care: Secondary | ICD-10-CM | POA: Diagnosis not present

## 2020-01-24 DIAGNOSIS — Z6831 Body mass index (BMI) 31.0-31.9, adult: Secondary | ICD-10-CM | POA: Diagnosis not present

## 2020-01-24 DIAGNOSIS — I509 Heart failure, unspecified: Secondary | ICD-10-CM | POA: Diagnosis not present

## 2020-01-25 ENCOUNTER — Telehealth: Payer: Self-pay | Admitting: Student

## 2020-01-25 DIAGNOSIS — R0989 Other specified symptoms and signs involving the circulatory and respiratory systems: Secondary | ICD-10-CM | POA: Diagnosis not present

## 2020-01-25 NOTE — Telephone Encounter (Signed)
Number listed is from [facility]. Will forward to provider.

## 2020-01-25 NOTE — Telephone Encounter (Signed)
New message:       Wells Guiles calling to report that this patient has CHF exacerbation. Pleased call if need to at [phone].

## 2020-01-27 DIAGNOSIS — I5033 Acute on chronic diastolic (congestive) heart failure: Secondary | ICD-10-CM | POA: Diagnosis not present

## 2020-01-27 DIAGNOSIS — R42 Dizziness and giddiness: Secondary | ICD-10-CM | POA: Diagnosis not present

## 2020-01-27 DIAGNOSIS — J479 Bronchiectasis, uncomplicated: Secondary | ICD-10-CM | POA: Diagnosis not present

## 2020-01-27 NOTE — Telephone Encounter (Signed)
Noted for appt tomorrow.  

## 2020-01-28 ENCOUNTER — Encounter: Payer: Self-pay | Admitting: Student

## 2020-01-28 ENCOUNTER — Other Ambulatory Visit: Payer: Self-pay

## 2020-01-28 ENCOUNTER — Ambulatory Visit (INDEPENDENT_AMBULATORY_CARE_PROVIDER_SITE_OTHER): Payer: Medicare HMO | Admitting: Student

## 2020-01-28 VITALS — BP 98/68 | HR 77 | Ht 64.0 in | Wt 185.0 lb

## 2020-01-28 DIAGNOSIS — N184 Chronic kidney disease, stage 4 (severe): Secondary | ICD-10-CM | POA: Diagnosis not present

## 2020-01-28 DIAGNOSIS — I480 Paroxysmal atrial fibrillation: Secondary | ICD-10-CM

## 2020-01-28 DIAGNOSIS — I5031 Acute diastolic (congestive) heart failure: Secondary | ICD-10-CM

## 2020-01-28 DIAGNOSIS — I5032 Chronic diastolic (congestive) heart failure: Secondary | ICD-10-CM

## 2020-01-28 DIAGNOSIS — I509 Heart failure, unspecified: Secondary | ICD-10-CM | POA: Diagnosis not present

## 2020-01-28 DIAGNOSIS — I10 Essential (primary) hypertension: Secondary | ICD-10-CM

## 2020-01-28 DIAGNOSIS — E1122 Type 2 diabetes mellitus with diabetic chronic kidney disease: Secondary | ICD-10-CM | POA: Diagnosis not present

## 2020-01-28 LAB — BASIC METABOLIC PANEL
BUN/Creatinine Ratio: 17 (ref 12–28)
BUN: 52 mg/dL — ABNORMAL HIGH (ref 8–27)
CO2: 31 mmol/L — ABNORMAL HIGH (ref 20–29)
Calcium: 9.1 mg/dL (ref 8.7–10.3)
Chloride: 94 mmol/L — ABNORMAL LOW (ref 96–106)
Creatinine, Ser: 2.98 mg/dL — ABNORMAL HIGH (ref 0.57–1.00)
GFR calc Af Amer: 16 mL/min/{1.73_m2} — ABNORMAL LOW (ref >59)
GFR calc non Af Amer: 14 mL/min/{1.73_m2} — ABNORMAL LOW (ref >59)
Glucose: 128 mg/dL — ABNORMAL HIGH (ref 65–99)
Potassium: 4 mmol/L (ref 3.5–5.2)
Sodium: 137 mmol/L (ref 134–144)

## 2020-01-28 MED ORDER — TORSEMIDE 20 MG PO TABS
ORAL_TABLET | ORAL | 3 refills | Status: DC
Start: 2020-01-28 — End: 2020-04-07

## 2020-01-28 NOTE — Patient Instructions (Signed)
Medication Instructions:  Increase Torsemide to 40 mg twice a day today and tomorrow, the decrease back to 40 in the morning and 20 mg in the evening, you make take an extra tablet as needed  *If you need a refill on your cardiac medications before your next appointment, please call your pharmacy*   Lab Work: Bmp- today   If you have labs (blood work) drawn today and your tests are completely normal, you will receive your results only by: Marland Kitchen MyChart Message (if you have MyChart) OR . A paper copy in the mail If you have any lab test that is abnormal or we need to change your treatment, we will call you to review the results.   Testing/Procedures: None ordered    Follow-Up: Follow up in 6-8 weeks   Other Instructions None

## 2020-01-28 NOTE — Progress Notes (Signed)
PCP:  Asencion Noble, MD Primary Cardiologist: Virl Axe, MD Electrophysiologist: Virl Axe, MD   Amy Wiggins is a 86 y.o. female seen today for Virl Axe, MD for acute visit due to SOB.  Since last being seen in our clinic the patient reports SOB with moderate exertion. Chronic orthopnea on 2 pillows. She is SOB changing clothes or transitioning from chair.  HH aide states she drinks 5-6 large thermos (estimated 32 oz plus) of water a day. She also eats 6-8 popsicles. No significant peripheral edema. Overall not very active. O2 sats 85% on portable O2 tank. [name] states stable at home on home tank. They have a very small portable tank and do this to "preserve" oxygen. she denies chest pain, palpitations, nausea, vomiting, dizziness, syncope, weight gain, or early satiety.  Past Medical History:  Diagnosis Date  . Chronic anticoagulation   . Depression   . Diabetes mellitus   . Hyperlipidemia   . Hypertension   . PAF (paroxysmal atrial fibrillation) ([place])   . PFO (patent foramen ovale)   . Stroke ([place])    TIA's x 2   Past Surgical History:  Procedure Laterality Date  . COLONOSCOPY    . COLONOSCOPY N/A 09/09/2014   Procedure: COLONOSCOPY;  Surgeon: Rogene Houston, MD;  Location: [facility];  Service: Endoscopy;  Laterality: N/A;  1010 - moved to 7:30 - Ann to notify  . EP IMPLANTABLE DEVICE N/A 06/07/2015   Procedure: Loop Recorder Insertion;  Surgeon: Deboraha Sprang, MD;  Location: [facility];  Service: Cardiovascular;  Laterality: N/A;  . TEE WITHOUT CARDIOVERSION N/A 06/07/2015   Procedure: TRANSESOPHAGEAL ECHOCARDIOGRAM (TEE);  Surgeon: Thayer Headings, MD;  Location: [facility] ENDOSCOPY;  Service: Cardiovascular;  Laterality: N/A;    Current Outpatient Medications  Medication Sig Dispense Refill  . acetaminophen (TYLENOL) 500 MG tablet Take 1,000 mg by mouth at bedtime. may also take 2 tablets during the day as needed for pain    . amiodarone (PACERONE) 200 MG  tablet Take 1 tablet (200 mg total) by mouth daily. 30 tablet 0  . atorvastatin (LIPITOR) 20 MG tablet Take 20 mg by mouth daily.    Marland Kitchen buPROPion (WELLBUTRIN XL) 300 MG 24 hr tablet Take 1 tablet (300 mg total) by mouth daily. 30 tablet 0  . citalopram (CELEXA) 20 MG tablet Take 1 tablet (20 mg total) by mouth daily. 30 tablet 0  . diltiazem (CARDIZEM CD) 120 MG 24 hr capsule Take 1 capsule (120 mg total) by mouth daily. 30 capsule 0  . DROPLET PEN NEEDLES 32G X 4 MM MISC     . ELIQUIS 2.5 MG TABS tablet Take 1 tablet (2.5 mg total) by mouth 2 (two) times daily. 60 tablet 0  . insulin aspart (NOVOLOG FLEXPEN) 100 UNIT/ML FlexPen Inject 10 Units into the skin 3 (three) times daily with meals. insulin pen; 100 unit/mL (3 mL); amt: 10 units; subcutaneous Special Instructions: admin 5 units for CBG >150 Before Meals 08:00 AM, 11:30 AM, 05:00 PM 15 mL 0  . insulin glargine (LANTUS) 100 UNIT/ML injection Inject 0.3 mLs (30 Units total) into the skin at bedtime. 15 mL 0  . ipratropium-albuterol (DUONEB) 0.5-2.5 (3) MG/3ML SOLN Take 3 mLs by nebulization every 6 (six) hours. 360 mL 0  . Lancet Devices (TRUEDRAW LANCING DEVICE) MISC     . Magnesium Oxide 250 MG TABS Take 2 tablets (500 mg total) by mouth every evening. 60 tablet 0  .  mirtazapine (REMERON) 15 MG tablet Take 15 mg by mouth at bedtime.    . Multiple Vitamin (MULTIVITAMIN WITH MINERALS) TABS tablet Take 1 tablet by mouth at bedtime. For supplement    . Multiple Vitamins-Minerals (PRESERVISION AREDS 2) CAPS Take 1 capsule by mouth 2 (two) times daily.    . OXYGEN Inhale 2 L into the lungs continuous.    . pantoprazole (PROTONIX) 40 MG tablet Take 1 tablet (40 mg total) by mouth daily. For acid reflux/gerd 30 tablet 0  . potassium chloride SA (KLOR-CON) 20 MEQ tablet Take 1 tablet (20 mEq total) by mouth daily. 30 tablet 0  . torsemide (DEMADEX) 20 MG tablet Take 40mg  in am and 20mg  in the evening. 90 tablet 1  . traZODone (DESYREL) 150 MG tablet  Take 0.5 tablets (75 mg total) by mouth at bedtime. 0.5 tablet to = 75 mg 15 tablet 0  . TRUEplus Lancets 33G MISC      No current facility-administered medications for this visit.    No Known Allergies  Social History   Socioeconomic History  . Marital status: Widowed    Spouse name: Not on file  . Number of children: 2  . Years of education: Not on file  . Highest education level: Not on file  Occupational History  . Occupation: retired  Tobacco Use  . Smoking status: Former Smoker    Years: 10.00  . Smokeless tobacco: Never Used  . Tobacco comment: was an occasional smoker in the past  Vaping Use  . Vaping Use: Never used  Substance and Sexual Activity  . Alcohol use: No  . Drug use: No  . Sexual activity: Never  Other Topics Concern  . Not on file  Social History Narrative  . Not on file   Social Determinants of Health   Financial Resource Strain:   . Difficulty of Paying Living Expenses: Not on file  Food Insecurity:   . Worried About Charity fundraiser in the Last Year: Not on file  . Ran Out of Food in the Last Year: Not on file  Transportation Needs: No Transportation Needs  . Lack of Transportation (Medical): No  . Lack of Transportation (Non-Medical): No  Physical Activity:   . Days of Exercise per Week: Not on file  . Minutes of Exercise per Session: Not on file  Stress:   . Feeling of Stress : Not on file  Social Connections:   . Frequency of Communication with Friends and Family: Not on file  . Frequency of Social Gatherings with Friends and Family: Not on file  . Attends Religious Services: Not on file  . Active Member of Clubs or Organizations: Not on file  . Attends Archivist Meetings: Not on file  . Marital Status: Not on file  Intimate Partner Violence:   . Fear of Current or Ex-Partner: Not on file  . Emotionally Abused: Not on file  . Physically Abused: Not on file  . Sexually Abused: Not on file     Review of  Systems: General: No chills, fever, night sweats or weight changes  Cardiovascular:  No chest pain, dyspnea on exertion, edema, orthopnea, palpitations, paroxysmal nocturnal dyspnea Dermatological: No rash, lesions or masses Respiratory: No cough, dyspnea Urologic: No hematuria, dysuria Abdominal: No nausea, vomiting, diarrhea, bright red blood per rectum, melena, or hematemesis Neurologic: No visual changes, weakness, changes in mental status All other systems reviewed and are otherwise negative except as noted above.  Physical Exam: Vitals:  01/28/20 0937  BP: 98/68  Pulse: 77  SpO2: (!) 85%  Weight: 185 lb (83.9 kg)  Height: 5\' 4"  (1.626 m)    GEN- The patient is well appearing, alert and oriented x 3 today.   HEENT: normocephalic, atraumatic; sclera clear, conjunctiva pink; hearing intact; oropharynx clear; neck supple, no JVP Lymph- no cervical lymphadenopathy Lungs- Clear to ausculation bilaterally, normal work of breathing.  No wheezes, rales, rhonchi Heart- Regular rate and rhythm, no murmurs, rubs or gallops, PMI not laterally displaced GI- soft, non-tender, non-distended, bowel sounds present, no hepatosplenomegaly Extremities- no clubbing, cyanosis, or edema; DP/PT/radial pulses 2+ bilaterally MS- no significant deformity or atrophy Skin- warm and dry, no rash or lesion Psych- euthymic mood, full affect Neuro- strength and sensation are intact  EKG is not ordered.   Additional studies reviewed include: Previous EP office notes  Assessment and Plan:  1. Paroxysmal Afib CHA2DS2Vasc is 7 on Eliquis 2.5 mg BID (Age/Creatinine) ILR is EOS, she is not interested in having it removed Regular on exam today. Continue amiodarone and diltiazem.  TSH elevated 8/12. Amio surveillance labs today with CHF exacerbation.   2. HTN Continue current medications.  Previously taken off losartan with hypotension.  Continue to avoid ACE/ARB with CKD.   3. HFpEF Volume  status at least moderately up NYHA NYHA III-IIIb symptoms Diuresis complicated by CKD IV-V. She has been seen by Nephrology and will not pursue HD.  BMET today. Will plan repeat pending results.  Take torsemide 40 mg BID x 2 days, then continue 40 mg q am and 20 mg pm. I have asked them to focus on decreasing her fluid intake, with an eventual goal closer to 2-2.5 L a day (this is still less than half of what she had been doing.   4. Fatigue, chronic component Stable  5. Murmur TTE <1 year ago reviewed Echo 07/28/2019 LVEF 60-65%, Moderate LVH, Grade 2 DD, severe LAE, Moderate to severe MS by mean gradient of 10 mmHg. Not surgical candidate with CKD IV-V and no plans to pursue HD.  Palliative care has been involved.   6. CKD IV Creatinine has gradually trended up over the past year.  She has seen Nephrology. She has decided not to pursue HD  7. Goals of Care Discussed at length in office. She has a DNR at home. She understands that her prognosis is relatively poor due to her degree of kidney disease. Balancing her oral intake, diuresis, and symptoms will be difficult.   RTC 6-8 weeks to reassess. Consider repeat BMET pending results.   Shirley Friar, PA-C  01/28/20 9:53 AM

## 2020-02-07 ENCOUNTER — Other Ambulatory Visit: Payer: Self-pay

## 2020-02-07 ENCOUNTER — Telehealth: Payer: Self-pay | Admitting: Pulmonary Disease

## 2020-02-07 ENCOUNTER — Telehealth: Payer: Self-pay | Admitting: Student

## 2020-02-07 ENCOUNTER — Inpatient Hospital Stay (HOSPITAL_COMMUNITY): Payer: Medicare HMO | Attending: Hematology and Oncology

## 2020-02-07 DIAGNOSIS — Z794 Long term (current) use of insulin: Secondary | ICD-10-CM | POA: Diagnosis not present

## 2020-02-07 DIAGNOSIS — F329 Major depressive disorder, single episode, unspecified: Secondary | ICD-10-CM | POA: Diagnosis not present

## 2020-02-07 DIAGNOSIS — Z8673 Personal history of transient ischemic attack (TIA), and cerebral infarction without residual deficits: Secondary | ICD-10-CM | POA: Insufficient documentation

## 2020-02-07 DIAGNOSIS — E119 Type 2 diabetes mellitus without complications: Secondary | ICD-10-CM | POA: Insufficient documentation

## 2020-02-07 DIAGNOSIS — D509 Iron deficiency anemia, unspecified: Secondary | ICD-10-CM | POA: Diagnosis not present

## 2020-02-07 DIAGNOSIS — Z87891 Personal history of nicotine dependence: Secondary | ICD-10-CM | POA: Insufficient documentation

## 2020-02-07 DIAGNOSIS — D649 Anemia, unspecified: Secondary | ICD-10-CM

## 2020-02-07 DIAGNOSIS — I1310 Hypertensive heart and chronic kidney disease without heart failure, with stage 1 through stage 4 chronic kidney disease, or unspecified chronic kidney disease: Secondary | ICD-10-CM | POA: Insufficient documentation

## 2020-02-07 DIAGNOSIS — N184 Chronic kidney disease, stage 4 (severe): Secondary | ICD-10-CM | POA: Diagnosis not present

## 2020-02-07 DIAGNOSIS — I480 Paroxysmal atrial fibrillation: Secondary | ICD-10-CM | POA: Insufficient documentation

## 2020-02-07 DIAGNOSIS — Z7901 Long term (current) use of anticoagulants: Secondary | ICD-10-CM | POA: Diagnosis not present

## 2020-02-07 DIAGNOSIS — Z79899 Other long term (current) drug therapy: Secondary | ICD-10-CM | POA: Insufficient documentation

## 2020-02-07 DIAGNOSIS — E785 Hyperlipidemia, unspecified: Secondary | ICD-10-CM | POA: Diagnosis not present

## 2020-02-07 LAB — CBC WITH DIFFERENTIAL/PLATELET
Abs Immature Granulocytes: 0.08 10*3/uL — ABNORMAL HIGH (ref 0.00–0.07)
Basophils Absolute: 0 10*3/uL (ref 0.0–0.1)
Basophils Relative: 0 %
Eosinophils Absolute: 0.7 10*3/uL — ABNORMAL HIGH (ref 0.0–0.5)
Eosinophils Relative: 7 %
HCT: 24 % — ABNORMAL LOW (ref 36.0–46.0)
Hemoglobin: 6.8 g/dL — CL (ref 12.0–15.0)
Immature Granulocytes: 1 %
Lymphocytes Relative: 8 %
Lymphs Abs: 0.8 10*3/uL (ref 0.7–4.0)
MCH: 28.1 pg (ref 26.0–34.0)
MCHC: 28.3 g/dL — ABNORMAL LOW (ref 30.0–36.0)
MCV: 99.2 fL (ref 80.0–100.0)
Monocytes Absolute: 1 10*3/uL (ref 0.1–1.0)
Monocytes Relative: 9 %
Neutro Abs: 8.1 10*3/uL — ABNORMAL HIGH (ref 1.7–7.7)
Neutrophils Relative %: 75 %
Platelets: 409 10*3/uL — ABNORMAL HIGH (ref 150–400)
RBC: 2.42 MIL/uL — ABNORMAL LOW (ref 3.87–5.11)
RDW: 14.1 % (ref 11.5–15.5)
WBC: 10.7 10*3/uL — ABNORMAL HIGH (ref 4.0–10.5)
nRBC: 0 % (ref 0.0–0.2)

## 2020-02-07 LAB — IRON AND TIBC
Iron: 20 ug/dL — ABNORMAL LOW (ref 28–170)
Saturation Ratios: 6 % — ABNORMAL LOW (ref 10.4–31.8)
TIBC: 359 ug/dL (ref 250–450)
UIBC: 339 ug/dL

## 2020-02-07 LAB — FERRITIN: Ferritin: 75 ng/mL (ref 11–307)

## 2020-02-07 NOTE — Progress Notes (Signed)
CRITICAL VALUE ALERT  Critical Value:  hgb 6.8  Date & Time Notied:  02/07/2020 at 1411  Provider Notified: Dr. Chryl Heck  Orders Received/Actions taken: transfuse 2 units PRBC

## 2020-02-07 NOTE — Telephone Encounter (Signed)
I spoke with A Tillery PA and pt HGB is very low... I spoke with the [facility] nurse and she advised that they were called this afternoon for a planned blood transfusion this Thursday... they will continue to monitor and if she declines any further they will take her to the ED... they have given her the extra Torsemide this afternoon.

## 2020-02-07 NOTE — Telephone Encounter (Addendum)
Danielle the pts caregiver from [facility] called to report that the pt has been wheezing more than usual and has had a non-productive cough that at times sounds wet but better when she clears her throat.   Pt does not have any peripheral edema and no abdominal distention.   She is having a hard time laying flat at night she cannot catch her breath... she is not having any chest pain, no palpitations... she feels weak and very tired. She has been using a nebulizer multiple times a day with only acute relief.   Pt has not taken her extra prn torsemide so she will go head and take the extra now.   She has been eating well and drinking as her normal.   She will continue to monitor her. She will call EMS if anything worsens.   I will forward to A Tillery PA for advice and I have urged her to call the pts pulmonary MD.. Dr. Halford Chessman.

## 2020-02-07 NOTE — Telephone Encounter (Signed)
Pt has acute on chronic anemia by labs today. She should present to ED for evaluation by medicine. Suspect this is contributing to her fatigue and malaise.   Agree with taking prn torsemide and pulm follow up as well.  Legrand Como [address] [place], [place]

## 2020-02-07 NOTE — Telephone Encounter (Signed)
Spoke with Andee Poles, the pt's caregiver  She reports pt having increased SOB, wheezing and fatigue over the past 2-3 wks  She is coughing but no sputum production  No f/c/s, aches  She has not been able to lie down flat over the past few days due to increased SOB and wheezing while lying down  She is using neb a few times per day but does not seem to be helping  Has had covid 19 vax x 3  Please advise, thanks!

## 2020-02-07 NOTE — Telephone Encounter (Signed)
   Pt c/o Shortness Of Breath: STAT if SOB developed within the last 24 hours or pt is noticeably SOB on the phone  1. Are you currently SOB (can you hear that pt is SOB on the phone)? Patient is resting  2. How long have you been experiencing SOB? About a week  3. Are you SOB when sitting or when up moving around? With movement  4. Are you currently experiencing any other symptoms? Fatigue, wheezing  Danielle, caregiver of the patient is at the patient's house now. The patient is resting but the patient's family advised the caregiver to call us. The patient was in to see Oda Kilts 12/03 but may have fluid in her lungs. The caregiver was reluctant to wait until Friday 12/17 to make an appointment. Please call the caregiver

## 2020-02-07 NOTE — Telephone Encounter (Signed)
Spoke with Andee Poles and scheduled for appt with VS for 02/09/20 and advised ED sooner if needed. There were not any sooner openings unfortunately.

## 2020-02-07 NOTE — Telephone Encounter (Signed)
Needs ROV to assess.

## 2020-02-09 ENCOUNTER — Other Ambulatory Visit: Payer: Self-pay

## 2020-02-09 ENCOUNTER — Ambulatory Visit (INDEPENDENT_AMBULATORY_CARE_PROVIDER_SITE_OTHER): Payer: Medicare HMO | Admitting: Pulmonary Disease

## 2020-02-09 ENCOUNTER — Ambulatory Visit: Payer: Medicare HMO

## 2020-02-09 ENCOUNTER — Ambulatory Visit (INDEPENDENT_AMBULATORY_CARE_PROVIDER_SITE_OTHER): Payer: Medicare HMO

## 2020-02-09 ENCOUNTER — Encounter: Payer: Self-pay | Admitting: Pulmonary Disease

## 2020-02-09 VITALS — BP 122/62 | HR 80 | Temp 97.3°F | Ht 64.0 in | Wt 190.4 lb

## 2020-02-09 DIAGNOSIS — J441 Chronic obstructive pulmonary disease with (acute) exacerbation: Secondary | ICD-10-CM | POA: Diagnosis not present

## 2020-02-09 DIAGNOSIS — J449 Chronic obstructive pulmonary disease, unspecified: Secondary | ICD-10-CM | POA: Diagnosis not present

## 2020-02-09 MED ORDER — AMOXICILLIN-POT CLAVULANATE 875-125 MG PO TABS: 1.0000 | ORAL_TABLET | Freq: Two times a day (BID) | ORAL | 0 refills | Status: DC

## 2020-02-09 MED ORDER — BUDESONIDE-FORMOTEROL FUMARATE 80-4.5 MCG/ACT IN AERO
2.0000 | INHALATION_SPRAY | Freq: Two times a day (BID) | RESPIRATORY_TRACT | 5 refills | Status: DC
Start: 2020-02-09 — End: 2020-03-03

## 2020-02-09 MED ORDER — PREDNISONE 10 MG PO TABS: ORAL_TABLET | ORAL | 0 refills | Status: AC

## 2020-02-09 NOTE — Progress Notes (Signed)
[facility]  Chief Complaint  Patient presents with  . Acute Visit    Increased sob x 2 wks., cough-clear, wheezing, no fcs    Constitutional:  BP 122/62 (BP Location: Left Arm, Cuff Size: Large)   Pulse 80   Temp (!) 97.3 F (36.3 C) (Temporal)   Ht 5\' 4"  (1.626 m)   Wt 190 lb 6.4 oz (86.4 kg)   SpO2 93%   BMI 32.68 kg/m   Past Medical History:  RSV infection November 2017, Depression, DM, HLD, HTN, PAF, PFO, CVA, Anemia, CKD 4  Past Surgical History:  Her  has a past surgical history that includes Colonoscopy; Colonoscopy (N/A, 09/09/2014); Cardiac catheterization (N/A, 06/07/2015); and TEE without cardioversion (N/A, 06/07/2015).  Brief Summary:  Amy Wiggins is a 86 y.o. female former smoker with dyspnea and snoring.      Subjective:   She is here with her aide.  She had low SpO2 on pulsed oxygen at 2 liters.  Improved with 2 liters continuous flow.  For past two weeks she has cough, sputum, and wheeze.  Aide says she sounds crackly.  No fever or chest pain.  No leg swelling.  Hasn't heard from [facility] for PFT.  She has sleep study scheduled for tonight, but doesn't think she is up to doing this.    Physical Exam:   Appearance - wearing oxygen, in wheelchair  ENMT - no sinus tenderness, no oral exudate, no LAN, Mallampati 3 airway, no stridor  Respiratory - decreased breath sounds bilaterally, no wheezing or rales  CV - s1s2 regular rate and rhythm, 2/6 systolic murmur  Ext - no clubbing, no edema  Skin - no rashes  Psych - normal mood and affect    Pulmonary testing:    Chest Imaging:   CT chest 07/31/19 >> mild BTX   Sleep Tests:    Cardiac Tests:   Echo 07/28/19 >> EF 60 to 65%, grade 2 DD, severe LA dilation, mod/severe MS  Social History:  She  reports that she has quit smoking. She quit after 10.00 years of use. She has never used smokeless tobacco. She reports that she does not drink alcohol  and does not use drugs.  Family History:  Her family history includes Atrial fibrillation in her sister; Dementia in her sister; Diabetes in her son; Heart attack in her daughter; Hypertension in her son; Parkinson's disease in her sister; Stroke in her father.    Labs:   CMP Latest Ref Rng & Units 01/28/2020 12/13/2019 10/19/2019  Glucose 65 - 99 mg/dL 128(H) 216(H) 189(H)  BUN 8 - 27 mg/dL 52(H) 43(H) 50(H)  Creatinine 0.57 - 1.00 mg/dL 2.98(H) 3.57(H) 3.04(H)  Sodium 134 - 144 mmol/L 137 136 134  Potassium 3.5 - 5.2 mmol/L 4.0 4.2 4.2  Chloride 96 - 106 mmol/L 94(L) 95(L) 93(L)  CO2 20 - 29 mmol/L 31(H) 29 28  Calcium 8.7 - 10.3 mg/dL 9.1 9.0 9.4  Total Protein 6.5 - 8.1 g/dL - 6.0(L) -  Total Bilirubin 0.3 - 1.2 mg/dL - 0.5 -  Alkaline Phos 38 - 126 U/L - 194(H) -  AST 15 - 41 U/L - 48(H) -  ALT 0 - 44 U/L - 69(H) -    CBC Latest Ref Rng & Units 02/07/2020 12/13/2019 10/20/2019  WBC 4.0 - 10.5 K/uL 10.7(H) 7.5 -  Hemoglobin 12.0 - 15.0 g/dL 6.8(LL) 8.6(L) 8.7(L)  Hematocrit 36.0 - 46.0 % 24.0(L) 28.9(L) 30.6(L)  Platelets  150 - 400 K/uL 409(H) 324 -    ABG    Component Value Date/Time   PHART 7.431 11/12/2019 1500   PCO2ART 47.7 11/12/2019 1500   PO2ART 56.1 (L) 11/12/2019 1500   HCO3 30.5 (H) 11/12/2019 1500   TCO2 30 02/21/2016 1935   O2SAT 88.8 11/12/2019 1500    Lab Results  Component Value Date   TSH 5.325 (H) 10/07/2019    Discussion:  She has snoring, sleep disruption, apnea and daytime sleepiness.  She has history of CVA, A fib, hypertension, and depression.  I am concerned she could have obstructive sleep apnea.  She has history of smoking and recurrent episodes of pneumonia.  She has been on 2 liters supplemental oxygen.  Recent CT chest showed changes of bronchiectasis likely from prior infections.  Assessment/Plan:   Probable obstructive lung disease with acute exacerbation. Bronchiectasis. - will get chest xray today - will give course of  prednisone, augmentin - add symbicort - continue duoneb prn - will need to reschedule PFT after she has recovered  Snoring with excessive daytime sleepiness. - she will need to reschedule in lab sleep study - it is my medical opinion that she requires an in lab sleep study given that she has chronic hypoxic respiratory failure and requires supplemental oxygen  Chronic respiratory failure with hypoxia. - suspect this is related to bronchiectasis and possible obstructive lung disease - continue 2 liters oxygen 24/7; needs to use continuous flow oxygen  Anemia. - she will f/u with PCP   Time Spent Involved in Patient Care on Day of Examination:  31 minutes  Follow up:  Patient Instructions  Chest xray today  Prednisone 10 mg pill >> 3 pills daily for 2 days, 2 pills daily for 2 days, 1 pill daily for 2 days  Augmentin 1 pill in the morning and one pill in the evening for 7 days  Budesonide-formoterol (symbicort) two puffs in the morning and two puffs in the evening, and rinse your mouth after each use  Follow up in [place] office in 3 weeks   Medication List:   Allergies as of 02/09/2020   No Known Allergies     Medication List       Accurate as of February 09, 2020 12:11 PM. If you have any questions, ask your nurse or doctor.        acetaminophen 500 MG tablet Commonly known as: TYLENOL Take 1,000 mg by mouth at bedtime. may also take 2 tablets during the day as needed for pain   amiodarone 200 MG tablet Commonly known as: PACERONE Take 1 tablet (200 mg total) by mouth daily.   amoxicillin-clavulanate 875-125 MG tablet Commonly known as: AUGMENTIN Take 1 tablet by mouth 2 (two) times daily. Started by: Chesley Mires, MD   atorvastatin 20 MG tablet Commonly known as: LIPITOR Take 20 mg by mouth daily.   budesonide-formoterol 80-4.5 MCG/ACT inhaler Commonly known as: Symbicort Inhale 2 puffs into the lungs 2 (two) times daily. Started by: Chesley Mires,  MD   buPROPion 300 MG 24 hr tablet Commonly known as: WELLBUTRIN XL Take 1 tablet (300 mg total) by mouth daily.   citalopram 20 MG tablet Commonly known as: CELEXA Take 1 tablet (20 mg total) by mouth daily.   diltiazem 120 MG 24 hr capsule Commonly known as: CARDIZEM CD Take 1 capsule (120 mg total) by mouth daily.   Droplet Pen Needles 32G X 4 MM Misc Generic drug: Insulin Pen Needle   Eliquis  2.5 MG Tabs tablet Generic drug: apixaban Take 1 tablet (2.5 mg total) by mouth 2 (two) times daily.   insulin glargine 100 UNIT/ML injection Commonly known as: Lantus Inject 0.3 mLs (30 Units total) into the skin at bedtime.   ipratropium-albuterol 0.5-2.5 (3) MG/3ML Soln Commonly known as: DUONEB Take 3 mLs by nebulization every 6 (six) hours.   Magnesium Oxide 250 MG Tabs Take 2 tablets (500 mg total) by mouth every evening.   mirtazapine 15 MG tablet Commonly known as: REMERON Take 15 mg by mouth at bedtime.   multivitamin with minerals Tabs tablet Take 1 tablet by mouth at bedtime. For supplement   NovoLOG FlexPen 100 UNIT/ML FlexPen Generic drug: insulin aspart Inject 10 Units into the skin 3 (three) times daily with meals. insulin pen; 100 unit/mL (3 mL); amt: 10 units; subcutaneous Special Instructions: admin 5 units for CBG >150 Before Meals 08:00 AM, 11:30 AM, 05:00 PM   OXYGEN Inhale 2 L into the lungs continuous.   pantoprazole 40 MG tablet Commonly known as: PROTONIX Take 1 tablet (40 mg total) by mouth daily. For acid reflux/gerd   potassium chloride SA 20 MEQ tablet Commonly known as: KLOR-CON Take 1 tablet (20 mEq total) by mouth daily.   predniSONE 10 MG tablet Commonly known as: DELTASONE Take 3 tablets (30 mg total) by mouth daily with breakfast for 2 days, THEN 2 tablets (20 mg total) daily with breakfast for 2 days, THEN 1 tablet (10 mg total) daily with breakfast for 2 days. Start taking on: February 09, 2020 Started by: Chesley Mires, MD    PreserVision AREDS 2 Caps Take 1 capsule by mouth 2 (two) times daily.   torsemide 20 MG tablet Commonly known as: DEMADEX Take 40mg  in am and 20mg  in the evening. You may take an extra tablet as needed   traZODone 150 MG tablet Commonly known as: DESYREL Take 0.5 tablets (75 mg total) by mouth at bedtime. 0.5 tablet to = 75 mg   TRUEdraw Lancing Device Misc   TRUEplus Lancets 33G Misc       Signature:  Chesley Mires, MD [place] Pager - [phone] 02/09/2020, 12:11 PM

## 2020-02-09 NOTE — Patient Instructions (Addendum)
Chest xray today  Prednisone 10 mg pill >> 3 pills daily for 2 days, 2 pills daily for 2 days, 1 pill daily for 2 days  Augmentin 1 pill in the morning and one pill in the evening for 7 days  Budesonide-formoterol (symbicort) two puffs in the morning and two puffs in the evening, and rinse your mouth after each use  Follow up in [place] office in 3 weeks

## 2020-02-10 ENCOUNTER — Telehealth: Payer: Self-pay | Admitting: Pulmonary Disease

## 2020-02-10 ENCOUNTER — Other Ambulatory Visit (HOSPITAL_COMMUNITY): Payer: Self-pay

## 2020-02-10 ENCOUNTER — Inpatient Hospital Stay (HOSPITAL_COMMUNITY): Payer: Medicare HMO

## 2020-02-10 DIAGNOSIS — E785 Hyperlipidemia, unspecified: Secondary | ICD-10-CM | POA: Diagnosis not present

## 2020-02-10 DIAGNOSIS — J9601 Acute respiratory failure with hypoxia: Secondary | ICD-10-CM | POA: Diagnosis not present

## 2020-02-10 DIAGNOSIS — D649 Anemia, unspecified: Secondary | ICD-10-CM

## 2020-02-10 DIAGNOSIS — F329 Major depressive disorder, single episode, unspecified: Secondary | ICD-10-CM | POA: Diagnosis not present

## 2020-02-10 DIAGNOSIS — Z8673 Personal history of transient ischemic attack (TIA), and cerebral infarction without residual deficits: Secondary | ICD-10-CM | POA: Diagnosis not present

## 2020-02-10 DIAGNOSIS — E119 Type 2 diabetes mellitus without complications: Secondary | ICD-10-CM | POA: Diagnosis not present

## 2020-02-10 DIAGNOSIS — Z7901 Long term (current) use of anticoagulants: Secondary | ICD-10-CM | POA: Diagnosis not present

## 2020-02-10 DIAGNOSIS — I480 Paroxysmal atrial fibrillation: Secondary | ICD-10-CM | POA: Diagnosis not present

## 2020-02-10 DIAGNOSIS — N184 Chronic kidney disease, stage 4 (severe): Secondary | ICD-10-CM | POA: Diagnosis not present

## 2020-02-10 DIAGNOSIS — D509 Iron deficiency anemia, unspecified: Secondary | ICD-10-CM | POA: Diagnosis not present

## 2020-02-10 DIAGNOSIS — I639 Cerebral infarction, unspecified: Secondary | ICD-10-CM | POA: Diagnosis not present

## 2020-02-10 DIAGNOSIS — I1310 Hypertensive heart and chronic kidney disease without heart failure, with stage 1 through stage 4 chronic kidney disease, or unspecified chronic kidney disease: Secondary | ICD-10-CM | POA: Diagnosis not present

## 2020-02-10 DIAGNOSIS — I5032 Chronic diastolic (congestive) heart failure: Secondary | ICD-10-CM | POA: Diagnosis not present

## 2020-02-10 LAB — PREPARE RBC (CROSSMATCH)

## 2020-02-10 MED ORDER — ACETAMINOPHEN 325 MG PO TABS
650.0000 mg | ORAL_TABLET | Freq: Once | ORAL | Status: AC
  Administered 2020-02-10: 13:00:00 650 mg via ORAL

## 2020-02-10 MED ORDER — ACETAMINOPHEN 325 MG PO TABS: 650.0000 mg | ORAL_TABLET | Freq: Once | ORAL | Status: DC

## 2020-02-10 MED ORDER — DIPHENHYDRAMINE HCL 25 MG PO CAPS
25.0000 mg | ORAL_CAPSULE | Freq: Once | ORAL | Status: AC
  Administered 2020-02-10: 13:00:00 25 mg via ORAL

## 2020-02-10 MED ORDER — SODIUM CHLORIDE 0.9% IV SOLUTION
250.0000 mL | Freq: Once | INTRAVENOUS | Status: AC
  Administered 2020-02-10: 250 mL via INTRAVENOUS

## 2020-02-10 MED ORDER — ACETAMINOPHEN 325 MG PO TABS
ORAL_TABLET | ORAL | Status: AC
  Filled 2020-02-10: qty 2

## 2020-02-10 MED ORDER — DIPHENHYDRAMINE HCL 25 MG PO CAPS: 25.0000 mg | ORAL_CAPSULE | Freq: Once | ORAL | Status: DC

## 2020-02-10 MED ORDER — SODIUM CHLORIDE 0.9% IV SOLUTION: 250.0000 mL | Freq: Once | INTRAVENOUS | Status: DC

## 2020-02-10 MED ORDER — DIPHENHYDRAMINE HCL 25 MG PO CAPS
ORAL_CAPSULE | ORAL | Status: AC
  Filled 2020-02-10: qty 1

## 2020-02-10 NOTE — Patient Instructions (Signed)
You were seen today for blood transfusions. You received 2 units of Packed red blood cells.  Please follow up with [place] on 12/30 as scheduled.

## 2020-02-10 NOTE — Telephone Encounter (Signed)
Attempted to call patient and did not get an answer. Will try again tomorrow (02/11/2020) morning to get in touch with her to go over results and recommendations.

## 2020-02-10 NOTE — Progress Notes (Signed)
Patient was here today for blood transfusion. She received 2 units PRBC without incidence.  She was discharged via wheelchair in stable condition with her caregiver.  She has her upcoming appointments for follow up.

## 2020-02-10 NOTE — Telephone Encounter (Signed)
DG Chest 2 View  Result Date: 02/09/2020 CLINICAL DATA:  COPD exacerbation EXAM: CHEST - 2 VIEW COMPARISON:  09/09/2019, CT 07/31/2019 FINDINGS: Recording device over the left lower chest. Small bilateral effusions. Cardiomegaly with vascular congestion and mild interstitial edema. Aortic atherosclerosis. No pneumothorax. IMPRESSION: Cardiomegaly with vascular congestion, mild interstitial edema and small bilateral effusions. Electronically Signed   By: Donavan Foil M.D.   On: 02/09/2020 23:20    Please let her know her chest xray shows extra fluid build up in and around her lungs.  She should take an extra dose of torsemide today and tomorrow - so she should take torsemide 40 mg bid for 02/10/20 and 02/11/20.  On 02/12/20 she should resume torsemide 40 mg in the morning and 20 mg in the evening.

## 2020-02-11 LAB — BPAM RBC
Blood Product Expiration Date: 202201192359
Blood Product Expiration Date: 202201192359
ISSUE DATE / TIME: 202112161330
ISSUE DATE / TIME: 202112161514
Unit Type and Rh: 5100
Unit Type and Rh: 5100

## 2020-02-11 LAB — TYPE AND SCREEN
ABO/RH(D): O POS
Antibody Screen: NEGATIVE
Unit division: 0
Unit division: 0

## 2020-02-11 NOTE — Telephone Encounter (Signed)
Called and spoke with patient granddaughter, Amy Wiggins (per DPR) and went over xray results per Dr Halford Chessman. [name] expressed full understanding of Dr Juanetta Gosling recommendation and provided teach back as to taking extra dose of torsemide (40 mg BID) today and tomorrow and then (on Sunday) resume the torsemide 40 mg in the morning and 20 mg in the evening. Nothing further needed at this time.

## 2020-02-14 ENCOUNTER — Ambulatory Visit (HOSPITAL_COMMUNITY): Payer: Medicare HMO | Admitting: Hematology

## 2020-02-14 DIAGNOSIS — N184 Chronic kidney disease, stage 4 (severe): Secondary | ICD-10-CM | POA: Diagnosis not present

## 2020-02-14 DIAGNOSIS — D631 Anemia in chronic kidney disease: Secondary | ICD-10-CM | POA: Diagnosis not present

## 2020-02-14 DIAGNOSIS — E1122 Type 2 diabetes mellitus with diabetic chronic kidney disease: Secondary | ICD-10-CM | POA: Diagnosis not present

## 2020-02-14 DIAGNOSIS — I509 Heart failure, unspecified: Secondary | ICD-10-CM | POA: Diagnosis not present

## 2020-02-14 DIAGNOSIS — N179 Acute kidney failure, unspecified: Secondary | ICD-10-CM | POA: Diagnosis not present

## 2020-02-23 ENCOUNTER — Other Ambulatory Visit (HOSPITAL_COMMUNITY): Payer: Self-pay | Admitting: *Deleted

## 2020-02-23 DIAGNOSIS — D509 Iron deficiency anemia, unspecified: Secondary | ICD-10-CM

## 2020-02-23 DIAGNOSIS — D649 Anemia, unspecified: Secondary | ICD-10-CM

## 2020-02-23 DIAGNOSIS — N184 Chronic kidney disease, stage 4 (severe): Secondary | ICD-10-CM

## 2020-02-24 ENCOUNTER — Inpatient Hospital Stay (HOSPITAL_COMMUNITY): Payer: Medicare HMO

## 2020-02-24 ENCOUNTER — Inpatient Hospital Stay (HOSPITAL_BASED_OUTPATIENT_CLINIC_OR_DEPARTMENT_OTHER): Payer: Medicare HMO | Admitting: Hematology

## 2020-02-24 ENCOUNTER — Other Ambulatory Visit: Payer: Self-pay

## 2020-02-24 ENCOUNTER — Other Ambulatory Visit (HOSPITAL_COMMUNITY): Payer: Self-pay

## 2020-02-24 VITALS — BP 108/45 | HR 77 | Temp 97.3°F | Resp 18 | Wt 191.8 lb

## 2020-02-24 DIAGNOSIS — E119 Type 2 diabetes mellitus without complications: Secondary | ICD-10-CM | POA: Diagnosis not present

## 2020-02-24 DIAGNOSIS — Z7901 Long term (current) use of anticoagulants: Secondary | ICD-10-CM | POA: Diagnosis not present

## 2020-02-24 DIAGNOSIS — D649 Anemia, unspecified: Secondary | ICD-10-CM

## 2020-02-24 DIAGNOSIS — N184 Chronic kidney disease, stage 4 (severe): Secondary | ICD-10-CM

## 2020-02-24 DIAGNOSIS — D509 Iron deficiency anemia, unspecified: Secondary | ICD-10-CM

## 2020-02-24 DIAGNOSIS — I1310 Hypertensive heart and chronic kidney disease without heart failure, with stage 1 through stage 4 chronic kidney disease, or unspecified chronic kidney disease: Secondary | ICD-10-CM | POA: Diagnosis not present

## 2020-02-24 DIAGNOSIS — E785 Hyperlipidemia, unspecified: Secondary | ICD-10-CM | POA: Diagnosis not present

## 2020-02-24 DIAGNOSIS — I480 Paroxysmal atrial fibrillation: Secondary | ICD-10-CM | POA: Diagnosis not present

## 2020-02-24 DIAGNOSIS — Z8673 Personal history of transient ischemic attack (TIA), and cerebral infarction without residual deficits: Secondary | ICD-10-CM | POA: Diagnosis not present

## 2020-02-24 DIAGNOSIS — F329 Major depressive disorder, single episode, unspecified: Secondary | ICD-10-CM | POA: Diagnosis not present

## 2020-02-24 LAB — COMPREHENSIVE METABOLIC PANEL
ALT: 47 U/L — ABNORMAL HIGH (ref 0–44)
AST: 44 U/L — ABNORMAL HIGH (ref 15–41)
Albumin: 2.7 g/dL — ABNORMAL LOW (ref 3.5–5.0)
Alkaline Phosphatase: 194 U/L — ABNORMAL HIGH (ref 38–126)
Anion gap: 12 (ref 5–15)
BUN: 41 mg/dL — ABNORMAL HIGH (ref 8–23)
CO2: 32 mmol/L (ref 22–32)
Calcium: 9.2 mg/dL (ref 8.9–10.3)
Chloride: 95 mmol/L — ABNORMAL LOW (ref 98–111)
Creatinine, Ser: 2.94 mg/dL — ABNORMAL HIGH (ref 0.44–1.00)
GFR, Estimated: 15 mL/min — ABNORMAL LOW (ref >60)
Glucose, Bld: 189 mg/dL — ABNORMAL HIGH (ref 70–99)
Potassium: 3.6 mmol/L (ref 3.5–5.1)
Sodium: 139 mmol/L (ref 135–145)
Total Bilirubin: 0.3 mg/dL (ref 0.3–1.2)
Total Protein: 6.2 g/dL — ABNORMAL LOW (ref 6.5–8.1)

## 2020-02-24 LAB — CBC WITH DIFFERENTIAL/PLATELET
Abs Immature Granulocytes: 0.09 10*3/uL — ABNORMAL HIGH (ref 0.00–0.07)
Basophils Absolute: 0.1 10*3/uL (ref 0.0–0.1)
Basophils Relative: 1 %
Eosinophils Absolute: 0.7 10*3/uL — ABNORMAL HIGH (ref 0.0–0.5)
Eosinophils Relative: 7 %
HCT: 31.2 % — ABNORMAL LOW (ref 36.0–46.0)
Hemoglobin: 9.1 g/dL — ABNORMAL LOW (ref 12.0–15.0)
Immature Granulocytes: 1 %
Lymphocytes Relative: 8 %
Lymphs Abs: 0.8 10*3/uL (ref 0.7–4.0)
MCH: 27.7 pg (ref 26.0–34.0)
MCHC: 29.2 g/dL — ABNORMAL LOW (ref 30.0–36.0)
MCV: 95.1 fL (ref 80.0–100.0)
Monocytes Absolute: 0.9 10*3/uL (ref 0.1–1.0)
Monocytes Relative: 9 %
Neutro Abs: 7.9 10*3/uL — ABNORMAL HIGH (ref 1.7–7.7)
Neutrophils Relative %: 74 %
Platelets: 330 10*3/uL (ref 150–400)
RBC: 3.28 MIL/uL — ABNORMAL LOW (ref 3.87–5.11)
RDW: 14.8 % (ref 11.5–15.5)
WBC: 10.5 10*3/uL (ref 4.0–10.5)
nRBC: 0 % (ref 0.0–0.2)

## 2020-02-24 LAB — FERRITIN: Ferritin: 78 ng/mL (ref 11–307)

## 2020-02-24 LAB — SAMPLE TO BLOOD BANK

## 2020-02-24 LAB — IRON AND TIBC
Iron: 25 ug/dL — ABNORMAL LOW (ref 28–170)
Saturation Ratios: 7 % — ABNORMAL LOW (ref 10.4–31.8)
TIBC: 352 ug/dL (ref 250–450)
UIBC: 327 ug/dL

## 2020-02-24 NOTE — Patient Instructions (Signed)
[place] at [facility] Discharge Instructions  You were seen today by Dr. Delton Coombes. He went over your recent results. You will be scheduled to receive 2 iron infusions 1 week apart, then you will be started on iron infusions given every month. Your next appointment will be in 5 weeks with the nurse practitioner for labs and follow up.   Thank you for choosing [place] at [facility] to provide your oncology and hematology care.  To afford each patient quality time with our provider, please arrive at least 15 minutes before your scheduled appointment time.   If you have a lab appointment with the [place] please come in thru the Main Entrance and check in at the main information desk  You need to re-schedule your appointment should you arrive 10 or more minutes late.  We strive to give you quality time with our providers, and arriving late affects you and other patients whose appointments are after yours.  Also, if you no show three or more times for appointments you may be dismissed from the clinic at the providers discretion.     Again, thank you for choosing [facility]  Our hope is that these requests will decrease the amount of time that you wait before being seen by our physicians.       _____________________________________________________________  Should you have questions after your visit to [facility], please contact our office at [phone] between the hours of 8:00 a.m. and 4:30 p.m.  Voicemails left after 4:00 p.m. will not be returned until the following business day.  For prescription refill requests, have your pharmacy contact our office and allow 72 hours.    Cancer Center Support Programs:   > Cancer Support Group  2nd Tuesday of the month 1pm-2pm, Journey Room

## 2020-02-24 NOTE — Progress Notes (Signed)
[place],  [id number]   CLINIC:  Medical Oncology/Hematology  PCP:  Asencion Noble, MD [address] / [place] [address]  [phone]  REASON FOR VISIT:  Follow-up for normocytic anemia  PRIOR THERAPY: None  CURRENT THERAPY: Feraheme every 4 weeks  INTERVAL HISTORY:  [name], a 86 y.o. female, returns for routine follow-up for her normocytic anemia. Amy Wiggins was last seen on 12/14/2019.  Today Amy Wiggins is accompanied by her caretaker and Amy Wiggins reports feeling okay. Amy Wiggins received 2 units of PRBC on 12/16. Amy Wiggins admits to having melena and Amy Wiggins bleeds easily when Amy Wiggins gets her blood drawn. Amy Wiggins is taking Eliquis BID for her paroxysmal a-fib; Amy Wiggins is followed by cardiology-Dr. Virl Axe. Her energy levels improve noticeably after Amy Wiggins receives iron or PRBC. Amy Wiggins reports having dizziness when her hemoglobin levels drop. Her appetite is excellent.  Amy Wiggins is currently living at home and has a caretaker living with her 24/7. Amy Wiggins is able to ambulate at home, but does not prep her own food.   REVIEW OF SYSTEMS:  Review of Systems  Constitutional: Positive for fatigue (50%). Negative for appetite change.  Gastrointestinal: Negative for blood in stool.       Melena  Neurological: Positive for dizziness (occasional).  Hematological: Bruises/bleeds easily (on Eliquis).  All other systems reviewed and are negative.   PAST MEDICAL/SURGICAL HISTORY:  Past Medical History:  Diagnosis Date  . Chronic anticoagulation   . Depression   . Diabetes mellitus   . Hyperlipidemia   . Hypertension   . PAF (paroxysmal atrial fibrillation) ([place])   . PFO (patent foramen ovale)   . Stroke ([place])    TIA's x 2   Past Surgical History:  Procedure Laterality Date  . COLONOSCOPY    . COLONOSCOPY N/A 09/09/2014   Procedure: COLONOSCOPY;  Surgeon: Rogene Houston, MD;  Location[facility];  Service: Endoscopy;  Laterality: N/A;  1010 - moved to  7:30 - Ann to notify  . EP IMPLANTABLE DEVICE N/A 06/07/2015   Procedure: Loop Recorder Insertion;  Surgeon: Deboraha Sprang, MD;  Location: [place] [facility] LAB;  Service: Cardiovascular;  Laterality: N/A;  . TEE WITHOUT CARDIOVERSION N/A 06/07/2015   Procedure: TRANSESOPHAGEAL ECHOCARDIOGRAM (TEE);  Surgeon: Thayer Headings, MD;  Location: [facility] ENDOSCOPY;  Service: Cardiovascular;  Laterality: N/A;    SOCIAL HISTORY:  Social History   Socioeconomic History  . Marital status: Widowed    Spouse name: Not on file  . Number of children: 2  . Years of education: Not on file  . Highest education level: Not on file  Occupational History  . Occupation: retired  Tobacco Use  . Smoking status: Former Smoker    Years: 10.00  . Smokeless tobacco: Never Used  . Tobacco comment: was an occasional smoker in the past  Vaping Use  . Vaping Use: Never used  Substance and Sexual Activity  . Alcohol use: No  . Drug use: No  . Sexual activity: Never  Other Topics Concern  . Not on file  Social History Narrative  . Not on file   Social Determinants of Health   Financial Resource Strain: Not on file  Food Insecurity: Not on file  Transportation Needs: No Transportation Needs  . Lack of Transportation (Medical): No  . Lack of Transportation (Non-Medical): No  Physical Activity: Not on file  Stress: Not on file  Social Connections: Not on file  Intimate Partner  Violence: Not on file    FAMILY HISTORY:  Family History  Problem Relation Age of Onset  . Stroke Father   . Dementia Sister   . Atrial fibrillation Sister   . Parkinson's disease Sister   . Heart attack Daughter   . Diabetes Son   . Hypertension Son     CURRENT MEDICATIONS:  Current Outpatient Medications  Medication Sig Dispense Refill  . amiodarone (PACERONE) 200 MG tablet Take 1 tablet (200 mg total) by mouth daily. 30 tablet 0  . amoxicillin-clavulanate (AUGMENTIN) 875-125 MG tablet Take 1 tablet by mouth 2 (two) times  daily. 14 tablet 0  . atorvastatin (LIPITOR) 20 MG tablet Take 20 mg by mouth daily.    . budesonide-formoterol (SYMBICORT) 80-4.5 MCG/ACT inhaler Inhale 2 puffs into the lungs 2 (two) times daily. 1 each 5  . buPROPion (WELLBUTRIN XL) 300 MG 24 hr tablet Take 1 tablet (300 mg total) by mouth daily. 30 tablet 0  . citalopram (CELEXA) 20 MG tablet Take 1 tablet (20 mg total) by mouth daily. 30 tablet 0  . diltiazem (CARDIZEM CD) 120 MG 24 hr capsule Take 1 capsule (120 mg total) by mouth daily. 30 capsule 0  . DROPLET PEN NEEDLES 32G X 4 MM MISC     . ELIQUIS 2.5 MG TABS tablet Take 1 tablet (2.5 mg total) by mouth 2 (two) times daily. 60 tablet 0  . insulin aspart (NOVOLOG FLEXPEN) 100 UNIT/ML FlexPen Inject 10 Units into the skin 3 (three) times daily with meals. insulin pen; 100 unit/mL (3 mL); amt: 10 units; subcutaneous Special Instructions: admin 5 units for CBG >150 Before Meals 08:00 AM, 11:30 AM, 05:00 PM 15 mL 0  . insulin glargine (LANTUS) 100 UNIT/ML injection Inject 0.3 mLs (30 Units total) into the skin at bedtime. 15 mL 0  . ipratropium-albuterol (DUONEB) 0.5-2.5 (3) MG/3ML SOLN Take 3 mLs by nebulization every 6 (six) hours. 360 mL 0  . Lancet Devices (TRUEDRAW LANCING DEVICE) MISC     . Magnesium Oxide 250 MG TABS Take 2 tablets (500 mg total) by mouth every evening. 60 tablet 0  . mirtazapine (REMERON) 15 MG tablet Take 15 mg by mouth at bedtime.    . Multiple Vitamin (MULTIVITAMIN WITH MINERALS) TABS tablet Take 1 tablet by mouth at bedtime. For supplement    . Multiple Vitamins-Minerals (PRESERVISION AREDS 2) CAPS Take 1 capsule by mouth 2 (two) times daily.    . OXYGEN Inhale 2 L into the lungs continuous.    . pantoprazole (PROTONIX) 40 MG tablet Take 1 tablet (40 mg total) by mouth daily. For acid reflux/gerd 30 tablet 0  . potassium chloride SA (KLOR-CON) 20 MEQ tablet Take 1 tablet (20 mEq total) by mouth daily. 30 tablet 0  . torsemide (DEMADEX) 20 MG tablet Take 40mg  in  am and 20mg  in the evening. You may take an extra tablet as needed 180 tablet 3  . traZODone (DESYREL) 150 MG tablet Take 0.5 tablets (75 mg total) by mouth at bedtime. 0.5 tablet to = 75 mg 15 tablet 0  . TRUEplus Lancets 33G MISC     . acetaminophen (TYLENOL) 500 MG tablet Take 1,000 mg by mouth at bedtime. may also take 2 tablets during the day as needed for pain (Patient not taking: Reported on 02/24/2020)     No current facility-administered medications for this visit.    ALLERGIES:  No Known Allergies  PHYSICAL EXAM:  Performance status (ECOG): 2 - Symptomatic, <50%  confined to bed  Vitals:   02/24/20 1111  BP: (!) 108/45  Pulse: 77  Resp: 18  Temp: (!) 97.3 F (36.3 C)  SpO2: 92%   Wt Readings from Last 3 Encounters:  02/24/20 191 lb 12.8 oz (87 kg)  02/09/20 190 lb 6.4 oz (86.4 kg)  01/28/20 185 lb (83.9 kg)   Physical Exam Vitals reviewed.  Constitutional:      Appearance: Normal appearance. Amy Wiggins is obese.     Interventions: Nasal cannula in place.  Cardiovascular:     Rate and Rhythm: Normal rate and regular rhythm.     Pulses: Normal pulses.     Heart sounds: Normal heart sounds.  Pulmonary:     Effort: Pulmonary effort is normal.     Breath sounds: Normal breath sounds.  Musculoskeletal:     Right lower leg: No edema.     Left lower leg: No edema.  Neurological:     General: No focal deficit present.     Mental Status: Amy Wiggins is alert and oriented to person, place, and time.  Psychiatric:        Mood and Affect: Mood normal.        Behavior: Behavior normal.     LABORATORY DATA:  I have reviewed the labs as listed.  CBC Latest Ref Rng & Units 02/24/2020 02/07/2020 12/13/2019  WBC 4.0 - 10.5 K/uL 10.5 10.7(H) 7.5  Hemoglobin 12.0 - 15.0 g/dL 9.1(L) 6.8(LL) 8.6(L)  Hematocrit 36.0 - 46.0 % 31.2(L) 24.0(L) 28.9(L)  Platelets 150 - 400 K/uL 330 409(H) 324   CMP Latest Ref Rng & Units 02/24/2020 01/28/2020 12/13/2019  Glucose 70 - 99 mg/dL 189(H) 128(H)  216(H)  BUN 8 - 23 mg/dL 41(H) 52(H) 43(H)  Creatinine 0.44 - 1.00 mg/dL 2.94(H) 2.98(H) 3.57(H)  Sodium 135 - 145 mmol/L 139 137 136  Potassium 3.5 - 5.1 mmol/L 3.6 4.0 4.2  Chloride 98 - 111 mmol/L 95(L) 94(L) 95(L)  CO2 22 - 32 mmol/L 32 31(H) 29  Calcium 8.9 - 10.3 mg/dL 9.2 9.1 9.0  Total Protein 6.5 - 8.1 g/dL 6.2(L) - 6.0(L)  Total Bilirubin 0.3 - 1.2 mg/dL 0.3 - 0.5  Alkaline Phos 38 - 126 U/L 194(H) - 194(H)  AST 15 - 41 U/L 44(H) - 48(H)  ALT 0 - 44 U/L 47(H) - 69(H)      Component Value Date/Time   RBC 3.28 (L) 02/24/2020 0947   MCV 95.1 02/24/2020 0947   MCV 76 (L) 10/19/2019 1250   MCH 27.7 02/24/2020 0947   MCHC 29.2 (L) 02/24/2020 0947   RDW 14.8 02/24/2020 0947   RDW 15.2 10/19/2019 1250   LYMPHSABS 0.8 02/24/2020 0947   LYMPHSABS 1.1 10/19/2019 1250   MONOABS 0.9 02/24/2020 0947   EOSABS 0.7 (H) 02/24/2020 0947   EOSABS 0.5 (H) 10/19/2019 1250   BASOSABS 0.1 02/24/2020 0947   BASOSABS 0.1 10/19/2019 1250   Lab Results  Component Value Date   TIBC 352 02/24/2020   TIBC 359 02/07/2020   TIBC 290 12/13/2019   FERRITIN 78 02/24/2020   FERRITIN 75 02/07/2020   FERRITIN 135 12/13/2019   IRONPCTSAT 7 (L) 02/24/2020   IRONPCTSAT 6 (L) 02/07/2020   IRONPCTSAT 13 12/13/2019    DIAGNOSTIC IMAGING:  I have independently reviewed the scans and discussed with the patient. DG Chest 2 View  Result Date: 02/09/2020 CLINICAL DATA:  COPD exacerbation EXAM: CHEST - 2 VIEW COMPARISON:  09/09/2019, CT 07/31/2019 FINDINGS: Recording device over the left lower chest.  Small bilateral effusions. Cardiomegaly with vascular congestion and mild interstitial edema. Aortic atherosclerosis. No pneumothorax. IMPRESSION: Cardiomegaly with vascular congestion, mild interstitial edema and small bilateral effusions. Electronically Signed   By: Donavan Foil M.D.   On: 02/09/2020 23:20     ASSESSMENT:  1. Normocytic anemia: -Recent hospital admission with hemoglobin of 6.7 on  09/09/2019, received 1 unit of PRBC. -Ferrlecit 125 mg on 08/20/2019 and 08/02/2019. -Colonoscopy on 04/04/2009 at [facility] with 3 small polyps ablated via cold biopsy, few small diverticula in the sigmoid colon. -Colonoscopy on 09/09/2014 with tubular adenoma in the cecum removed. -CT chest without contrast on 07/31/2019 showed extensive irregular bilateral groundglass airspace opacity, most conspicuous in the right upper lobe. -Likely etiology from CKD and iron deficiency. -Last Feraheme infusion was on 11/08/2019 on 11/15/2019.   PLAN:  1. Normocytic anemia: -Normocytic anemia from CKD and relative iron deficiency. -Amy Wiggins reports dark stools.  Last Feraheme was on 12/20/2019 and 12/31/2019. -Amy Wiggins had to receive 2 units of PRBC on 02/07/2020 as her hemoglobin dropped to 6.8. -Recommend stool for occult blood testing. -Amy Wiggins will need more close monitoring and frequent iron infusions (once a month). -I have recommended 2 more infusions of iron.  RTC 5 weeks for follow-up and labs.  2. CKD stage IV: -Creatinine today is 2.94.  3. Atrial fibrillation: -Continue Eliquis 2.5 mg twice daily.  Orders placed this encounter:  Orders Placed This Encounter  Procedures  . CBC with Differential/Platelet  . Ferritin  . Comprehensive metabolic panel  . Iron and TIBC     Derek Jack, MD [place] [phone]   I, Milinda Antis, am acting as a scribe for Dr. Sanda Linger.  I, Derek Jack MD, have reviewed the above documentation for accuracy and completeness, and I agree with the above.

## 2020-02-27 DIAGNOSIS — J479 Bronchiectasis, uncomplicated: Secondary | ICD-10-CM | POA: Diagnosis not present

## 2020-02-27 DIAGNOSIS — I5033 Acute on chronic diastolic (congestive) heart failure: Secondary | ICD-10-CM | POA: Diagnosis not present

## 2020-02-27 DIAGNOSIS — R42 Dizziness and giddiness: Secondary | ICD-10-CM | POA: Diagnosis not present

## 2020-03-02 ENCOUNTER — Ambulatory Visit (HOSPITAL_COMMUNITY): Payer: Medicare HMO

## 2020-03-02 ENCOUNTER — Inpatient Hospital Stay (HOSPITAL_COMMUNITY): Payer: Medicare HMO | Attending: Adult Health

## 2020-03-02 ENCOUNTER — Other Ambulatory Visit: Payer: Self-pay

## 2020-03-02 ENCOUNTER — Encounter (HOSPITAL_COMMUNITY): Payer: Self-pay

## 2020-03-02 VITALS — BP 124/44 | HR 80 | Temp 97.2°F | Resp 20

## 2020-03-02 DIAGNOSIS — E1122 Type 2 diabetes mellitus with diabetic chronic kidney disease: Secondary | ICD-10-CM

## 2020-03-02 DIAGNOSIS — Z79899 Other long term (current) drug therapy: Secondary | ICD-10-CM | POA: Insufficient documentation

## 2020-03-02 DIAGNOSIS — D509 Iron deficiency anemia, unspecified: Secondary | ICD-10-CM | POA: Insufficient documentation

## 2020-03-02 MED ORDER — SODIUM CHLORIDE 0.9 % IV SOLN
510.0000 mg | Freq: Once | INTRAVENOUS | Status: AC
  Administered 2020-03-02: 510 mg via INTRAVENOUS
  Filled 2020-03-02: qty 510

## 2020-03-02 MED ORDER — ACETAMINOPHEN 325 MG PO TABS
650.0000 mg | ORAL_TABLET | Freq: Once | ORAL | Status: AC
  Administered 2020-03-02: 650 mg via ORAL

## 2020-03-02 MED ORDER — SODIUM CHLORIDE 0.9 % IV SOLN: Freq: Once | INTRAVENOUS | Status: AC

## 2020-03-02 MED ORDER — ACETAMINOPHEN 325 MG PO TABS
ORAL_TABLET | ORAL | Status: AC
  Filled 2020-03-02: qty 2

## 2020-03-02 MED ORDER — LORATADINE 10 MG PO TABS
10.0000 mg | ORAL_TABLET | Freq: Once | ORAL | Status: AC
  Administered 2020-03-02: 10 mg via ORAL

## 2020-03-02 MED ORDER — LORATADINE 10 MG PO TABS
ORAL_TABLET | ORAL | Status: AC
  Filled 2020-03-02: qty 1

## 2020-03-02 NOTE — Progress Notes (Signed)
Amy Wiggins [place] tolerated Feraheme infusion well without complaints or incident. Peripheral IV site checked with positive blood return prior to and after infusion. VSS upon discharge. Pt discharged via wheelchair in satisfactory condition accompanied by her caregiver

## 2020-03-02 NOTE — Patient Instructions (Signed)
[facility] Discharge Instructions  Received Feraheme infusion today. Follow-up as scheduled   Thank you for choosing [facility] at [facility] to provide your oncology and hematology care.  To afford each patient quality time with our provider, please arrive at least 15 minutes before your scheduled appointment time.   If you have a lab appointment with the Cancer Center please come in thru the Main Entrance and check in at the main information desk.  You need to re-schedule your appointment should you arrive 10 or more minutes late.  We strive to give you quality time with our providers, and arriving late affects you and other patients whose appointments are after yours.  Also, if you no show three or more times for appointments you may be dismissed from the clinic at the providers discretion.     Again, thank you for choosing [facility].  Our hope is that these requests will decrease the amount of time that you wait before being seen by our physicians.       _____________________________________________________________  Should you have questions after your visit to [facility], please contact our office at [phone] and follow the prompts.  Our office hours are 8:00 a.m. and 4:30 p.m. Monday - Friday.  Please note that voicemails left after 4:00 p.m. may not be returned until the following business day.  We are closed weekends and major holidays.  You do have access to a nurse 24-7, just call the main number to the clinic [phone] and do not press any options, hold on the line and a nurse will answer the phone.    For prescription refill requests, have your pharmacy contact our office and allow 72 hours.    Due to Covid, you will need to wear a mask upon entering the hospital. If you do not have a mask, a mask will be given to you at the Main Entrance upon arrival. For doctor visits, patients may have  1 support person age 18 or older with them. For treatment visits, patients can not have anyone with them due to social distancing guidelines and our immunocompromised population.     

## 2020-03-03 ENCOUNTER — Ambulatory Visit (INDEPENDENT_AMBULATORY_CARE_PROVIDER_SITE_OTHER): Payer: Medicare HMO | Admitting: Pulmonary Disease

## 2020-03-03 ENCOUNTER — Encounter: Payer: Self-pay | Admitting: Pulmonary Disease

## 2020-03-03 VITALS — BP 122/74 | HR 78 | Temp 97.3°F | Ht 64.0 in | Wt 189.0 lb

## 2020-03-03 DIAGNOSIS — J479 Bronchiectasis, uncomplicated: Secondary | ICD-10-CM

## 2020-03-03 DIAGNOSIS — R0683 Snoring: Secondary | ICD-10-CM | POA: Diagnosis not present

## 2020-03-03 DIAGNOSIS — I5033 Acute on chronic diastolic (congestive) heart failure: Secondary | ICD-10-CM | POA: Diagnosis not present

## 2020-03-03 DIAGNOSIS — R42 Dizziness and giddiness: Secondary | ICD-10-CM | POA: Diagnosis not present

## 2020-03-03 DIAGNOSIS — J9611 Chronic respiratory failure with hypoxia: Secondary | ICD-10-CM | POA: Diagnosis not present

## 2020-03-03 NOTE — Patient Instructions (Signed)
Follow up in 3 months

## 2020-03-03 NOTE — Progress Notes (Signed)
[facility]  Chief Complaint  Patient presents with  . Follow-up    Shortness of breath with activity    Constitutional:  BP 122/74 (BP Location: Left Arm, Cuff Size: Normal)   Pulse 78   Temp (!) 97.3 F (36.3 C) (Other (Comment)) Comment (Src): wrist  Ht 5\' 4"  (1.626 m)   Wt 189 lb (85.7 kg)   SpO2 95% Comment: 2L Pulse O2  BMI 32.44 kg/m   Past Medical History:  RSV infection November 2017, Depression, DM, HLD, HTN, PAF, PFO, CVA, Anemia, CKD 4  Past Surgical History:  Her  has a past surgical history that includes Colonoscopy; Colonoscopy (N/A, 09/09/2014); Cardiac catheterization (N/A, 06/07/2015); and TEE without cardioversion (N/A, 06/07/2015).  Brief Summary:  Amy Wiggins is a 87 y.o. female former smoker with dyspnea and snoring.      Subjective:   She is here with her aide.  She wasn't able to complete sleep study or PFT.  She doesn't feel like her sleep is as much of an issue at present.  She isn't having cough, wheeze, or sputum.   Symbicort caused tongue irritation and didn't seem to help much.  She has been using nebulizer bid.  Uses 2 liters oxygen 24/7.  CXR from 02/09/20 showed interstitial edema.   Physical Exam:   Appearance - wearing oxygen  ENMT - no sinus tenderness, no oral exudate, no LAN, Mallampati 3 airway, no stridor  Respiratory - equal breath sounds bilaterally, no wheezing or rales  CV - s1s2 regular rate and rhythm, 2/6 SM  Ext - no clubbing, no edema  Skin - no rashes  Psych - normal mood and affect   Pulmonary testing:    Chest Imaging:   CT chest 07/31/19 >> mild BTX   Sleep Tests:    Cardiac Tests:   Echo 07/28/19 >> EF 60 to 65%, grade 2 DD, severe LA dilation, mod/severe MS  Social History:  She  reports that she has quit smoking. She quit after 10.00 years of use. She has never used smokeless tobacco. She reports that she does not drink alcohol and does not  use drugs.  Family History:  Her family history includes Atrial fibrillation in her sister; Dementia in her sister; Diabetes in her son; Heart attack in her daughter; Hypertension in her son; Parkinson's disease in her sister; Stroke in her father.     Assessment/Plan:   Probable obstructive lung disease with bronchiectasis and recurrent episodes of pneumonia. - intolerant of symbicort - continue prn duoneb - defer PFT testing for now  Snoring with excessive daytime sleepiness. - she wasn't able to complete in lab sleep study - feels her sleep has improved, and would like to hold off on rescheduling sleep study  Chronic respiratory failure with hypoxia. - suspect this is related to bronchiectasis and possible obstructive lung disease - continue 2 liters oxygen 24/7 with continuous flow oxygen - goal SpO2 > 90% - explained she shouldn't wear nail polish on fingernail while checking pulse oximeter reading  Time Spent Involved in Patient Care on Day of Examination:  23 minutes  Follow up:  Patient Instructions  Follow up in 3 months   Medication List:   Allergies as of 03/03/2020   No Known Allergies     Medication List       Accurate as of March 03, 2020 12:49 PM. If you have any questions, ask your nurse or doctor.  STOP taking these medications   amoxicillin-clavulanate 875-125 MG tablet Commonly known as: AUGMENTIN Stopped by: Chesley Mires, MD   budesonide-formoterol 80-4.5 MCG/ACT inhaler Commonly known as: Symbicort Stopped by: Chesley Mires, MD     TAKE these medications   acetaminophen 500 MG tablet Commonly known as: TYLENOL Take 1,000 mg by mouth at bedtime. may also take 2 tablets during the day as needed for pain   amiodarone 200 MG tablet Commonly known as: PACERONE Take 1 tablet (200 mg total) by mouth daily.   atorvastatin 20 MG tablet Commonly known as: LIPITOR Take 20 mg by mouth daily.   buPROPion 300 MG 24 hr tablet Commonly known  as: WELLBUTRIN XL Take 1 tablet (300 mg total) by mouth daily.   citalopram 20 MG tablet Commonly known as: CELEXA Take 1 tablet (20 mg total) by mouth daily.   diltiazem 120 MG 24 hr capsule Commonly known as: CARDIZEM CD Take 1 capsule (120 mg total) by mouth daily.   Droplet Pen Needles 32G X 4 MM Misc Generic drug: Insulin Pen Needle   Eliquis 2.5 MG Tabs tablet Generic drug: apixaban Take 1 tablet (2.5 mg total) by mouth 2 (two) times daily.   insulin glargine 100 UNIT/ML injection Commonly known as: Lantus Inject 0.3 mLs (30 Units total) into the skin at bedtime.   ipratropium-albuterol 0.5-2.5 (3) MG/3ML Soln Commonly known as: DUONEB Take 3 mLs by nebulization every 6 (six) hours.   Magnesium Oxide 250 MG Tabs Take 2 tablets (500 mg total) by mouth every evening.   mirtazapine 15 MG tablet Commonly known as: REMERON Take 15 mg by mouth at bedtime.   multivitamin with minerals Tabs tablet Take 1 tablet by mouth at bedtime. For supplement   NovoLOG FlexPen 100 UNIT/ML FlexPen Generic drug: insulin aspart Inject 10 Units into the skin 3 (three) times daily with meals. insulin pen; 100 unit/mL (3 mL); amt: 10 units; subcutaneous Special Instructions: admin 5 units for CBG >150 Before Meals 08:00 AM, 11:30 AM, 05:00 PM   OXYGEN Inhale 2 L into the lungs continuous.   pantoprazole 40 MG tablet Commonly known as: PROTONIX Take 1 tablet (40 mg total) by mouth daily. For acid reflux/gerd   potassium chloride SA 20 MEQ tablet Commonly known as: KLOR-CON Take 1 tablet (20 mEq total) by mouth daily.   PreserVision AREDS 2 Caps Take 1 capsule by mouth 2 (two) times daily.   torsemide 20 MG tablet Commonly known as: DEMADEX Take 40mg  in am and 20mg  in the evening. You may take an extra tablet as needed   traZODone 150 MG tablet Commonly known as: DESYREL Take 0.5 tablets (75 mg total) by mouth at bedtime. 0.5 tablet to = 75 mg   TRUEdraw Lancing Device Misc    TRUEplus Lancets 33G Misc       Signature:  Chesley Mires, MD [place] Pager - [phone] 03/03/2020, 12:49 PM

## 2020-03-08 DIAGNOSIS — I480 Paroxysmal atrial fibrillation: Secondary | ICD-10-CM | POA: Diagnosis not present

## 2020-03-08 DIAGNOSIS — E119 Type 2 diabetes mellitus without complications: Secondary | ICD-10-CM | POA: Diagnosis not present

## 2020-03-08 DIAGNOSIS — Z515 Encounter for palliative care: Secondary | ICD-10-CM | POA: Diagnosis not present

## 2020-03-08 DIAGNOSIS — F339 Major depressive disorder, recurrent, unspecified: Secondary | ICD-10-CM | POA: Diagnosis not present

## 2020-03-08 DIAGNOSIS — I5032 Chronic diastolic (congestive) heart failure: Secondary | ICD-10-CM | POA: Diagnosis not present

## 2020-03-08 DIAGNOSIS — H353290 Exudative age-related macular degeneration, unspecified eye, stage unspecified: Secondary | ICD-10-CM | POA: Diagnosis not present

## 2020-03-09 ENCOUNTER — Inpatient Hospital Stay (HOSPITAL_COMMUNITY): Payer: Medicare HMO

## 2020-03-09 ENCOUNTER — Other Ambulatory Visit: Payer: Self-pay

## 2020-03-09 VITALS — BP 131/53 | HR 71 | Temp 97.2°F | Resp 18

## 2020-03-09 DIAGNOSIS — D509 Iron deficiency anemia, unspecified: Secondary | ICD-10-CM

## 2020-03-09 DIAGNOSIS — E1122 Type 2 diabetes mellitus with diabetic chronic kidney disease: Secondary | ICD-10-CM

## 2020-03-09 DIAGNOSIS — Z79899 Other long term (current) drug therapy: Secondary | ICD-10-CM | POA: Diagnosis not present

## 2020-03-09 MED ORDER — SODIUM CHLORIDE 0.9 % IV SOLN: Freq: Once | INTRAVENOUS | Status: AC

## 2020-03-09 MED ORDER — ACETAMINOPHEN 325 MG PO TABS
650.0000 mg | ORAL_TABLET | Freq: Once | ORAL | Status: AC
  Administered 2020-03-09: 650 mg via ORAL
  Filled 2020-03-09: qty 2

## 2020-03-09 MED ORDER — SODIUM CHLORIDE 0.9 % IV SOLN
510.0000 mg | Freq: Once | INTRAVENOUS | Status: AC
  Administered 2020-03-09: 510 mg via INTRAVENOUS
  Filled 2020-03-09: qty 510

## 2020-03-09 MED ORDER — LORATADINE 10 MG PO TABS
10.0000 mg | ORAL_TABLET | Freq: Once | ORAL | Status: AC
  Administered 2020-03-09: 10 mg via ORAL
  Filled 2020-03-09: qty 1

## 2020-03-09 NOTE — Progress Notes (Signed)
Patient presents today for Feraheme infusion.  Vital signs stable.  No new complaints since last visit.  Feraheme infusion given today per MD orders.  Tolerated infusion without adverse affects.  Vital signs stable.  No complaints at this time.  Discharge from clinic via wheelchair in stable condition.  Alert and oriented X 3.  Follow up with [facility] as scheduled.

## 2020-03-09 NOTE — Patient Instructions (Signed)
[facility] Discharge Instructions for Patients Receiving Chemotherapy  Today you received the following chemotherapy agents   To help prevent nausea and vomiting after your treatment, we encourage you to take your nausea medication   If you develop nausea and vomiting that is not controlled by your nausea medication, call the clinic.   BELOW ARE SYMPTOMS THAT SHOULD BE REPORTED IMMEDIATELY:  *FEVER GREATER THAN 100.5 F  *CHILLS WITH OR WITHOUT FEVER  NAUSEA AND VOMITING THAT IS NOT CONTROLLED WITH YOUR NAUSEA MEDICATION  *UNUSUAL SHORTNESS OF BREATH  *UNUSUAL BRUISING OR BLEEDING  TENDERNESS IN MOUTH AND THROAT WITH OR WITHOUT PRESENCE OF ULCERS  *URINARY PROBLEMS  *BOWEL PROBLEMS  UNUSUAL RASH Items with * indicate a potential emergency and should be followed up as soon as possible.  Feel free to call the clinic should you have any questions or concerns. The clinic phone number is [phone].  Please show the CHEMO ALERT CARD at check-in to the Emergency Department and triage nurse.   

## 2020-03-12 DIAGNOSIS — I639 Cerebral infarction, unspecified: Secondary | ICD-10-CM | POA: Diagnosis not present

## 2020-03-12 DIAGNOSIS — I5032 Chronic diastolic (congestive) heart failure: Secondary | ICD-10-CM | POA: Diagnosis not present

## 2020-03-12 DIAGNOSIS — N184 Chronic kidney disease, stage 4 (severe): Secondary | ICD-10-CM | POA: Diagnosis not present

## 2020-03-12 DIAGNOSIS — J9601 Acute respiratory failure with hypoxia: Secondary | ICD-10-CM | POA: Diagnosis not present

## 2020-03-17 ENCOUNTER — Ambulatory Visit: Payer: Medicare HMO | Admitting: Student

## 2020-03-28 ENCOUNTER — Inpatient Hospital Stay (HOSPITAL_COMMUNITY): Payer: Medicare HMO | Attending: Hematology

## 2020-03-29 ENCOUNTER — Other Ambulatory Visit: Payer: Self-pay

## 2020-03-29 ENCOUNTER — Inpatient Hospital Stay (HOSPITAL_COMMUNITY)
Admission: EM | Admit: 2020-03-29 | Discharge: 2020-04-07 | DRG: 871 | Disposition: A | Discharge status: Skilled Nursing Facility | Payer: Medicare HMO | Attending: Internal Medicine | Admitting: Internal Medicine

## 2020-03-29 ENCOUNTER — Encounter (HOSPITAL_COMMUNITY): Payer: Self-pay | Admitting: Emergency Medicine

## 2020-03-29 DIAGNOSIS — B952 Enterococcus as the cause of diseases classified elsewhere: Secondary | ICD-10-CM | POA: Diagnosis present

## 2020-03-29 DIAGNOSIS — K5909 Other constipation: Secondary | ICD-10-CM | POA: Diagnosis not present

## 2020-03-29 DIAGNOSIS — Z9981 Dependence on supplemental oxygen: Secondary | ICD-10-CM | POA: Diagnosis not present

## 2020-03-29 DIAGNOSIS — Z8673 Personal history of transient ischemic attack (TIA), and cerebral infarction without residual deficits: Secondary | ICD-10-CM | POA: Diagnosis not present

## 2020-03-29 DIAGNOSIS — S0990XA Unspecified injury of head, initial encounter: Secondary | ICD-10-CM | POA: Diagnosis not present

## 2020-03-29 DIAGNOSIS — I482 Chronic atrial fibrillation, unspecified: Secondary | ICD-10-CM | POA: Diagnosis present

## 2020-03-29 DIAGNOSIS — I130 Hypertensive heart and chronic kidney disease with heart failure and stage 1 through stage 4 chronic kidney disease, or unspecified chronic kidney disease: Secondary | ICD-10-CM | POA: Diagnosis present

## 2020-03-29 DIAGNOSIS — D72829 Elevated white blood cell count, unspecified: Secondary | ICD-10-CM

## 2020-03-29 DIAGNOSIS — J9811 Atelectasis: Secondary | ICD-10-CM | POA: Diagnosis not present

## 2020-03-29 DIAGNOSIS — F32A Depression, unspecified: Secondary | ICD-10-CM | POA: Diagnosis present

## 2020-03-29 DIAGNOSIS — F419 Anxiety disorder, unspecified: Secondary | ICD-10-CM | POA: Diagnosis present

## 2020-03-29 DIAGNOSIS — Z7951 Long term (current) use of inhaled steroids: Secondary | ICD-10-CM

## 2020-03-29 DIAGNOSIS — E785 Hyperlipidemia, unspecified: Secondary | ICD-10-CM | POA: Diagnosis present

## 2020-03-29 DIAGNOSIS — Z79899 Other long term (current) drug therapy: Secondary | ICD-10-CM

## 2020-03-29 DIAGNOSIS — W19XXXA Unspecified fall, initial encounter: Secondary | ICD-10-CM | POA: Diagnosis not present

## 2020-03-29 DIAGNOSIS — I609 Nontraumatic subarachnoid hemorrhage, unspecified: Secondary | ICD-10-CM | POA: Diagnosis present

## 2020-03-29 DIAGNOSIS — K219 Gastro-esophageal reflux disease without esophagitis: Secondary | ICD-10-CM | POA: Diagnosis present

## 2020-03-29 DIAGNOSIS — I959 Hypotension, unspecified: Secondary | ICD-10-CM | POA: Diagnosis not present

## 2020-03-29 DIAGNOSIS — R509 Fever, unspecified: Secondary | ICD-10-CM

## 2020-03-29 DIAGNOSIS — Z66 Do not resuscitate: Secondary | ICD-10-CM | POA: Diagnosis present

## 2020-03-29 DIAGNOSIS — N184 Chronic kidney disease, stage 4 (severe): Secondary | ICD-10-CM | POA: Diagnosis present

## 2020-03-29 DIAGNOSIS — I629 Nontraumatic intracranial hemorrhage, unspecified: Secondary | ICD-10-CM

## 2020-03-29 DIAGNOSIS — R0902 Hypoxemia: Secondary | ICD-10-CM | POA: Diagnosis not present

## 2020-03-29 DIAGNOSIS — E1165 Type 2 diabetes mellitus with hyperglycemia: Secondary | ICD-10-CM | POA: Diagnosis present

## 2020-03-29 DIAGNOSIS — S8001XA Contusion of right knee, initial encounter: Secondary | ICD-10-CM | POA: Diagnosis present

## 2020-03-29 DIAGNOSIS — Z87891 Personal history of nicotine dependence: Secondary | ICD-10-CM | POA: Diagnosis not present

## 2020-03-29 DIAGNOSIS — A419 Sepsis, unspecified organism: Secondary | ICD-10-CM | POA: Diagnosis not present

## 2020-03-29 DIAGNOSIS — R0602 Shortness of breath: Secondary | ICD-10-CM | POA: Diagnosis not present

## 2020-03-29 DIAGNOSIS — W1839XA Other fall on same level, initial encounter: Secondary | ICD-10-CM | POA: Diagnosis present

## 2020-03-29 DIAGNOSIS — Z7401 Bed confinement status: Secondary | ICD-10-CM | POA: Diagnosis not present

## 2020-03-29 DIAGNOSIS — I5033 Acute on chronic diastolic (congestive) heart failure: Secondary | ICD-10-CM | POA: Diagnosis not present

## 2020-03-29 DIAGNOSIS — Z7901 Long term (current) use of anticoagulants: Secondary | ICD-10-CM | POA: Diagnosis not present

## 2020-03-29 DIAGNOSIS — S39012A Strain of muscle, fascia and tendon of lower back, initial encounter: Secondary | ICD-10-CM | POA: Diagnosis not present

## 2020-03-29 DIAGNOSIS — R58 Hemorrhage, not elsewhere classified: Secondary | ICD-10-CM | POA: Diagnosis not present

## 2020-03-29 DIAGNOSIS — S066X0A Traumatic subarachnoid hemorrhage without loss of consciousness, initial encounter: Secondary | ICD-10-CM | POA: Diagnosis present

## 2020-03-29 DIAGNOSIS — J440 Chronic obstructive pulmonary disease with acute lower respiratory infection: Secondary | ICD-10-CM | POA: Diagnosis present

## 2020-03-29 DIAGNOSIS — J479 Bronchiectasis, uncomplicated: Secondary | ICD-10-CM | POA: Diagnosis not present

## 2020-03-29 DIAGNOSIS — Z794 Long term (current) use of insulin: Secondary | ICD-10-CM | POA: Diagnosis not present

## 2020-03-29 DIAGNOSIS — J9611 Chronic respiratory failure with hypoxia: Secondary | ICD-10-CM | POA: Diagnosis present

## 2020-03-29 DIAGNOSIS — R7881 Bacteremia: Secondary | ICD-10-CM | POA: Diagnosis not present

## 2020-03-29 DIAGNOSIS — J189 Pneumonia, unspecified organism: Secondary | ICD-10-CM | POA: Diagnosis present

## 2020-03-29 DIAGNOSIS — E1159 Type 2 diabetes mellitus with other circulatory complications: Secondary | ICD-10-CM | POA: Diagnosis present

## 2020-03-29 DIAGNOSIS — E1169 Type 2 diabetes mellitus with other specified complication: Secondary | ICD-10-CM | POA: Diagnosis present

## 2020-03-29 DIAGNOSIS — F411 Generalized anxiety disorder: Secondary | ICD-10-CM | POA: Diagnosis not present

## 2020-03-29 DIAGNOSIS — Y92002 Bathroom of unspecified non-institutional (private) residence single-family (private) house as the place of occurrence of the external cause: Secondary | ICD-10-CM

## 2020-03-29 DIAGNOSIS — I480 Paroxysmal atrial fibrillation: Secondary | ICD-10-CM | POA: Diagnosis present

## 2020-03-29 DIAGNOSIS — M545 Low back pain, unspecified: Secondary | ICD-10-CM | POA: Diagnosis not present

## 2020-03-29 DIAGNOSIS — S161XXA Strain of muscle, fascia and tendon at neck level, initial encounter: Secondary | ICD-10-CM

## 2020-03-29 DIAGNOSIS — Z515 Encounter for palliative care: Secondary | ICD-10-CM | POA: Diagnosis not present

## 2020-03-29 DIAGNOSIS — E870 Hyperosmolality and hypernatremia: Secondary | ICD-10-CM | POA: Diagnosis not present

## 2020-03-29 DIAGNOSIS — I5021 Acute systolic (congestive) heart failure: Secondary | ICD-10-CM | POA: Diagnosis not present

## 2020-03-29 DIAGNOSIS — I4891 Unspecified atrial fibrillation: Secondary | ICD-10-CM | POA: Diagnosis not present

## 2020-03-29 DIAGNOSIS — Z043 Encounter for examination and observation following other accident: Secondary | ICD-10-CM | POA: Diagnosis not present

## 2020-03-29 DIAGNOSIS — Z7189 Other specified counseling: Secondary | ICD-10-CM | POA: Diagnosis not present

## 2020-03-29 DIAGNOSIS — Z20822 Contact with and (suspected) exposure to covid-19: Secondary | ICD-10-CM | POA: Diagnosis present

## 2020-03-29 DIAGNOSIS — E11649 Type 2 diabetes mellitus with hypoglycemia without coma: Secondary | ICD-10-CM | POA: Diagnosis not present

## 2020-03-29 DIAGNOSIS — R42 Dizziness and giddiness: Secondary | ICD-10-CM | POA: Diagnosis not present

## 2020-03-29 DIAGNOSIS — J90 Pleural effusion, not elsewhere classified: Secondary | ICD-10-CM | POA: Diagnosis not present

## 2020-03-29 DIAGNOSIS — R2681 Unsteadiness on feet: Secondary | ICD-10-CM | POA: Diagnosis present

## 2020-03-29 DIAGNOSIS — S0003XA Contusion of scalp, initial encounter: Secondary | ICD-10-CM | POA: Diagnosis not present

## 2020-03-29 DIAGNOSIS — S80919A Unspecified superficial injury of unspecified knee, initial encounter: Secondary | ICD-10-CM | POA: Diagnosis not present

## 2020-03-29 DIAGNOSIS — I10 Essential (primary) hypertension: Secondary | ICD-10-CM | POA: Diagnosis not present

## 2020-03-29 DIAGNOSIS — I129 Hypertensive chronic kidney disease with stage 1 through stage 4 chronic kidney disease, or unspecified chronic kidney disease: Secondary | ICD-10-CM | POA: Diagnosis present

## 2020-03-29 DIAGNOSIS — A4181 Sepsis due to Enterococcus: Principal | ICD-10-CM | POA: Diagnosis present

## 2020-03-29 DIAGNOSIS — R52 Pain, unspecified: Secondary | ICD-10-CM | POA: Diagnosis not present

## 2020-03-29 DIAGNOSIS — I5032 Chronic diastolic (congestive) heart failure: Secondary | ICD-10-CM | POA: Diagnosis present

## 2020-03-29 DIAGNOSIS — E869 Volume depletion, unspecified: Secondary | ICD-10-CM | POA: Diagnosis present

## 2020-03-29 DIAGNOSIS — S8002XA Contusion of left knee, initial encounter: Secondary | ICD-10-CM | POA: Diagnosis present

## 2020-03-29 DIAGNOSIS — E1122 Type 2 diabetes mellitus with diabetic chronic kidney disease: Secondary | ICD-10-CM | POA: Diagnosis present

## 2020-03-29 DIAGNOSIS — R531 Weakness: Secondary | ICD-10-CM | POA: Diagnosis not present

## 2020-03-29 DIAGNOSIS — S0083XA Contusion of other part of head, initial encounter: Secondary | ICD-10-CM | POA: Diagnosis present

## 2020-03-29 NOTE — ED Triage Notes (Signed)
Pt had fall today at home and has been running a fever for a few days.

## 2020-03-29 NOTE — ED Notes (Signed)
EKG done and patient on cardiac monitoring at this time

## 2020-03-30 ENCOUNTER — Ambulatory Visit (HOSPITAL_COMMUNITY): Payer: Medicare HMO | Admitting: Hematology

## 2020-03-30 ENCOUNTER — Emergency Department (HOSPITAL_COMMUNITY): Payer: Medicare HMO

## 2020-03-30 ENCOUNTER — Inpatient Hospital Stay (HOSPITAL_COMMUNITY): Payer: Medicare HMO

## 2020-03-30 DIAGNOSIS — I130 Hypertensive heart and chronic kidney disease with heart failure and stage 1 through stage 4 chronic kidney disease, or unspecified chronic kidney disease: Secondary | ICD-10-CM | POA: Diagnosis present

## 2020-03-30 DIAGNOSIS — Z8673 Personal history of transient ischemic attack (TIA), and cerebral infarction without residual deficits: Secondary | ICD-10-CM | POA: Diagnosis not present

## 2020-03-30 DIAGNOSIS — W19XXXA Unspecified fall, initial encounter: Secondary | ICD-10-CM | POA: Diagnosis not present

## 2020-03-30 DIAGNOSIS — R509 Fever, unspecified: Secondary | ICD-10-CM | POA: Insufficient documentation

## 2020-03-30 DIAGNOSIS — Z79899 Other long term (current) drug therapy: Secondary | ICD-10-CM | POA: Diagnosis not present

## 2020-03-30 DIAGNOSIS — S066X0A Traumatic subarachnoid hemorrhage without loss of consciousness, initial encounter: Secondary | ICD-10-CM | POA: Diagnosis present

## 2020-03-30 DIAGNOSIS — Z7189 Other specified counseling: Secondary | ICD-10-CM | POA: Diagnosis not present

## 2020-03-30 DIAGNOSIS — E870 Hyperosmolality and hypernatremia: Secondary | ICD-10-CM | POA: Diagnosis not present

## 2020-03-30 DIAGNOSIS — Z7901 Long term (current) use of anticoagulants: Secondary | ICD-10-CM | POA: Diagnosis not present

## 2020-03-30 DIAGNOSIS — Z87891 Personal history of nicotine dependence: Secondary | ICD-10-CM | POA: Diagnosis not present

## 2020-03-30 DIAGNOSIS — Z20822 Contact with and (suspected) exposure to covid-19: Secondary | ICD-10-CM | POA: Diagnosis present

## 2020-03-30 DIAGNOSIS — B952 Enterococcus as the cause of diseases classified elsewhere: Secondary | ICD-10-CM | POA: Diagnosis not present

## 2020-03-30 DIAGNOSIS — I480 Paroxysmal atrial fibrillation: Secondary | ICD-10-CM | POA: Diagnosis present

## 2020-03-30 DIAGNOSIS — Y92002 Bathroom of unspecified non-institutional (private) residence single-family (private) house as the place of occurrence of the external cause: Secondary | ICD-10-CM | POA: Diagnosis not present

## 2020-03-30 DIAGNOSIS — J440 Chronic obstructive pulmonary disease with acute lower respiratory infection: Secondary | ICD-10-CM | POA: Diagnosis present

## 2020-03-30 DIAGNOSIS — I629 Nontraumatic intracranial hemorrhage, unspecified: Secondary | ICD-10-CM

## 2020-03-30 DIAGNOSIS — K219 Gastro-esophageal reflux disease without esophagitis: Secondary | ICD-10-CM | POA: Diagnosis present

## 2020-03-30 DIAGNOSIS — I5032 Chronic diastolic (congestive) heart failure: Secondary | ICD-10-CM | POA: Diagnosis present

## 2020-03-30 DIAGNOSIS — F32A Depression, unspecified: Secondary | ICD-10-CM | POA: Diagnosis present

## 2020-03-30 DIAGNOSIS — E785 Hyperlipidemia, unspecified: Secondary | ICD-10-CM | POA: Diagnosis present

## 2020-03-30 DIAGNOSIS — E1165 Type 2 diabetes mellitus with hyperglycemia: Secondary | ICD-10-CM | POA: Diagnosis present

## 2020-03-30 DIAGNOSIS — Z9981 Dependence on supplemental oxygen: Secondary | ICD-10-CM | POA: Diagnosis not present

## 2020-03-30 DIAGNOSIS — W1839XA Other fall on same level, initial encounter: Secondary | ICD-10-CM | POA: Diagnosis present

## 2020-03-30 DIAGNOSIS — I609 Nontraumatic subarachnoid hemorrhage, unspecified: Secondary | ICD-10-CM | POA: Diagnosis present

## 2020-03-30 DIAGNOSIS — R7881 Bacteremia: Secondary | ICD-10-CM | POA: Diagnosis not present

## 2020-03-30 DIAGNOSIS — J9611 Chronic respiratory failure with hypoxia: Secondary | ICD-10-CM | POA: Diagnosis present

## 2020-03-30 DIAGNOSIS — I482 Chronic atrial fibrillation, unspecified: Secondary | ICD-10-CM | POA: Diagnosis not present

## 2020-03-30 DIAGNOSIS — K5909 Other constipation: Secondary | ICD-10-CM | POA: Diagnosis not present

## 2020-03-30 DIAGNOSIS — A4181 Sepsis due to Enterococcus: Secondary | ICD-10-CM | POA: Diagnosis present

## 2020-03-30 DIAGNOSIS — Z515 Encounter for palliative care: Secondary | ICD-10-CM | POA: Diagnosis not present

## 2020-03-30 DIAGNOSIS — J189 Pneumonia, unspecified organism: Secondary | ICD-10-CM | POA: Diagnosis present

## 2020-03-30 DIAGNOSIS — Z794 Long term (current) use of insulin: Secondary | ICD-10-CM | POA: Diagnosis not present

## 2020-03-30 DIAGNOSIS — N184 Chronic kidney disease, stage 4 (severe): Secondary | ICD-10-CM | POA: Diagnosis present

## 2020-03-30 DIAGNOSIS — Z7951 Long term (current) use of inhaled steroids: Secondary | ICD-10-CM | POA: Diagnosis not present

## 2020-03-30 DIAGNOSIS — Z66 Do not resuscitate: Secondary | ICD-10-CM | POA: Diagnosis present

## 2020-03-30 LAB — BLOOD CULTURE ID PANEL (REFLEXED) - BCID2

## 2020-03-30 LAB — COMPREHENSIVE METABOLIC PANEL
ALT: 39 U/L (ref 0–44)
AST: 57 U/L — ABNORMAL HIGH (ref 15–41)
Albumin: 2.5 g/dL — ABNORMAL LOW (ref 3.5–5.0)
Alkaline Phosphatase: 175 U/L — ABNORMAL HIGH (ref 38–126)
Anion gap: 10 (ref 5–15)
BUN: 51 mg/dL — ABNORMAL HIGH (ref 8–23)
CO2: 28 mmol/L (ref 22–32)
Calcium: 8.9 mg/dL (ref 8.9–10.3)
Chloride: 89 mmol/L — ABNORMAL LOW (ref 98–111)
Creatinine, Ser: 2.65 mg/dL — ABNORMAL HIGH (ref 0.44–1.00)
GFR, Estimated: 17 mL/min — ABNORMAL LOW (ref >60)
Glucose, Bld: 222 mg/dL — ABNORMAL HIGH (ref 70–99)
Potassium: 3.9 mmol/L (ref 3.5–5.1)
Sodium: 127 mmol/L — ABNORMAL LOW (ref 135–145)
Total Bilirubin: 0.8 mg/dL (ref 0.3–1.2)
Total Protein: 6.7 g/dL (ref 6.5–8.1)

## 2020-03-30 LAB — URINALYSIS, ROUTINE W REFLEX MICROSCOPIC
Bacteria, UA: NONE SEEN
Bilirubin Urine: NEGATIVE
Glucose, UA: 150 mg/dL — AB
Ketones, ur: NEGATIVE mg/dL
Leukocytes,Ua: NEGATIVE
Nitrite: NEGATIVE
Protein, ur: NEGATIVE mg/dL
Specific Gravity, Urine: 1.009 (ref 1.005–1.030)
pH: 5 (ref 5.0–8.0)

## 2020-03-30 LAB — CBC WITH DIFFERENTIAL/PLATELET
Abs Immature Granulocytes: 0.2 10*3/uL — ABNORMAL HIGH (ref 0.00–0.07)
Basophils Absolute: 0 10*3/uL (ref 0.0–0.1)
Basophils Relative: 0 %
Eosinophils Absolute: 0 10*3/uL (ref 0.0–0.5)
Eosinophils Relative: 0 %
HCT: 33.5 % — ABNORMAL LOW (ref 36.0–46.0)
Hemoglobin: 10 g/dL — ABNORMAL LOW (ref 12.0–15.0)
Immature Granulocytes: 1 %
Lymphocytes Relative: 3 %
Lymphs Abs: 0.6 10*3/uL — ABNORMAL LOW (ref 0.7–4.0)
MCH: 27.1 pg (ref 26.0–34.0)
MCHC: 29.9 g/dL — ABNORMAL LOW (ref 30.0–36.0)
MCV: 90.8 fL (ref 80.0–100.0)
Monocytes Absolute: 1.1 10*3/uL — ABNORMAL HIGH (ref 0.1–1.0)
Monocytes Relative: 6 %
Neutro Abs: 17 10*3/uL — ABNORMAL HIGH (ref 1.7–7.7)
Neutrophils Relative %: 90 %
Platelets: 220 10*3/uL (ref 150–400)
RBC: 3.69 MIL/uL — ABNORMAL LOW (ref 3.87–5.11)
RDW: 15.6 % — ABNORMAL HIGH (ref 11.5–15.5)
WBC: 18.9 10*3/uL — ABNORMAL HIGH (ref 4.0–10.5)
nRBC: 0 % (ref 0.0–0.2)

## 2020-03-30 LAB — LACTIC ACID, PLASMA
Lactic Acid, Venous: 1.3 mmol/L (ref 0.5–1.9)
Lactic Acid, Venous: 1.2 mmol/L (ref 0.5–1.9)

## 2020-03-30 LAB — GLUCOSE, CAPILLARY: Glucose-Capillary: 148 mg/dL — ABNORMAL HIGH (ref 70–99)

## 2020-03-30 LAB — CBG MONITORING, ED
Glucose-Capillary: 173 mg/dL — ABNORMAL HIGH (ref 70–99)
Glucose-Capillary: 124 mg/dL — ABNORMAL HIGH (ref 70–99)
Glucose-Capillary: 107 mg/dL — ABNORMAL HIGH (ref 70–99)

## 2020-03-30 LAB — SARS CORONAVIRUS 2 BY RT PCR (HOSPITAL ORDER, PERFORMED IN ~~LOC~~ HOSPITAL LAB): SARS Coronavirus 2: NEGATIVE

## 2020-03-30 MED ORDER — DILTIAZEM HCL ER COATED BEADS 120 MG PO CP24
120.0000 mg | ORAL_CAPSULE | Freq: Every day | ORAL | Status: DC
  Administered 2020-03-30 – 2020-04-07 (×9): 120 mg via ORAL
  Filled 2020-03-30 (×9): qty 1

## 2020-03-30 MED ORDER — MORPHINE SULFATE (PF) 2 MG/ML IV SOLN
2.0000 mg | Freq: Once | INTRAVENOUS | Status: AC
Start: 2020-03-30 — End: 2020-03-30
  Administered 2020-03-30: 2 mg via INTRAVENOUS
  Filled 2020-03-30: qty 1

## 2020-03-30 MED ORDER — SODIUM CHLORIDE 0.9 % IV SOLN
INTRAVENOUS | Status: DC | PRN
  Administered 2020-03-30: 1000 mL via INTRAVENOUS

## 2020-03-30 MED ORDER — AMIODARONE HCL 200 MG PO TABS
200.0000 mg | ORAL_TABLET | Freq: Every day | ORAL | Status: DC
  Administered 2020-03-30 – 2020-04-07 (×9): 200 mg via ORAL
  Filled 2020-03-30 (×9): qty 1

## 2020-03-30 MED ORDER — FENTANYL CITRATE (PF) 100 MCG/2ML IJ SOLN
INTRAMUSCULAR | Status: AC
  Filled 2020-03-30: qty 2

## 2020-03-30 MED ORDER — VANCOMYCIN HCL 750 MG/150ML IV SOLN: 750.0000 mg | INTRAVENOUS | Status: DC

## 2020-03-30 MED ORDER — OXYCODONE HCL 5 MG PO TABS
5.0000 mg | ORAL_TABLET | ORAL | Status: DC | PRN
  Administered 2020-03-30 – 2020-04-07 (×10): 5 mg via ORAL
  Filled 2020-03-30 (×10): qty 1

## 2020-03-30 MED ORDER — VANCOMYCIN HCL 1750 MG/350ML IV SOLN
1750.0000 mg | Freq: Once | INTRAVENOUS | Status: AC
  Administered 2020-03-30: 1750 mg via INTRAVENOUS
  Filled 2020-03-30: qty 350

## 2020-03-30 MED ORDER — SODIUM CHLORIDE 0.9 % IV SOLN
500.0000 mg | INTRAVENOUS | Status: DC
  Administered 2020-03-30: 500 mg via INTRAVENOUS
  Filled 2020-03-30: qty 500

## 2020-03-30 MED ORDER — CHLORHEXIDINE GLUCONATE CLOTH 2 % EX PADS
6.0000 | MEDICATED_PAD | Freq: Every day | CUTANEOUS | Status: DC
  Administered 2020-03-30 – 2020-04-07 (×8): 6 via TOPICAL

## 2020-03-30 MED ORDER — ATORVASTATIN CALCIUM 20 MG PO TABS
20.0000 mg | ORAL_TABLET | Freq: Every day | ORAL | Status: DC
  Administered 2020-03-30 – 2020-04-07 (×9): 20 mg via ORAL
  Filled 2020-03-30 (×7): qty 1
  Filled 2020-03-30: qty 2
  Filled 2020-03-30: qty 1

## 2020-03-30 MED ORDER — ONDANSETRON HCL 4 MG/2ML IJ SOLN: 4.0000 mg | Freq: Four times a day (QID) | INTRAMUSCULAR | Status: DC | PRN

## 2020-03-30 MED ORDER — MIRTAZAPINE 15 MG PO TABS
15.0000 mg | ORAL_TABLET | Freq: Every day | ORAL | Status: DC
  Administered 2020-03-30 – 2020-04-06 (×8): 15 mg via ORAL
  Filled 2020-03-30 (×8): qty 1

## 2020-03-30 MED ORDER — SODIUM CHLORIDE 0.9 % IV SOLN
2.0000 g | INTRAVENOUS | Status: DC
  Administered 2020-03-30: 2 g via INTRAVENOUS
  Filled 2020-03-30: qty 2

## 2020-03-30 MED ORDER — ACETAMINOPHEN 325 MG PO TABS
650.0000 mg | ORAL_TABLET | Freq: Four times a day (QID) | ORAL | Status: DC | PRN
  Administered 2020-03-30 – 2020-04-06 (×4): 650 mg via ORAL
  Filled 2020-03-30 (×5): qty 2

## 2020-03-30 MED ORDER — IPRATROPIUM-ALBUTEROL 0.5-2.5 (3) MG/3ML IN SOLN
3.0000 mL | Freq: Three times a day (TID) | RESPIRATORY_TRACT | Status: DC
  Administered 2020-03-31 – 2020-04-06 (×18): 3 mL via RESPIRATORY_TRACT
  Filled 2020-03-30 (×19): qty 3

## 2020-03-30 MED ORDER — ACETAMINOPHEN 650 MG RE SUPP: 650.0000 mg | Freq: Four times a day (QID) | RECTAL | Status: DC | PRN

## 2020-03-30 MED ORDER — INSULIN ASPART 100 UNIT/ML ~~LOC~~ SOLN
0.0000 [IU] | Freq: Every day | SUBCUTANEOUS | Status: DC
  Administered 2020-04-05: 2 [IU] via SUBCUTANEOUS
  Administered 2020-04-06: 3 [IU] via SUBCUTANEOUS

## 2020-03-30 MED ORDER — INSULIN ASPART 100 UNIT/ML ~~LOC~~ SOLN
0.0000 [IU] | Freq: Three times a day (TID) | SUBCUTANEOUS | Status: DC
  Administered 2020-03-30: 2 [IU] via SUBCUTANEOUS
  Administered 2020-03-30 – 2020-03-31 (×3): 3 [IU] via SUBCUTANEOUS
  Administered 2020-04-02 (×2): 5 [IU] via SUBCUTANEOUS
  Administered 2020-04-02: 3 [IU] via SUBCUTANEOUS
  Administered 2020-04-03: 5 [IU] via SUBCUTANEOUS
  Administered 2020-04-03 – 2020-04-05 (×6): 3 [IU] via SUBCUTANEOUS
  Administered 2020-04-05: 2 [IU] via SUBCUTANEOUS
  Administered 2020-04-05 – 2020-04-06 (×2): 5 [IU] via SUBCUTANEOUS
  Administered 2020-04-06: 2 [IU] via SUBCUTANEOUS
  Administered 2020-04-06: 5 [IU] via SUBCUTANEOUS
  Administered 2020-04-07: 3 [IU] via SUBCUTANEOUS
  Filled 2020-03-30 (×2): qty 1

## 2020-03-30 MED ORDER — CITALOPRAM HYDROBROMIDE 20 MG PO TABS
20.0000 mg | ORAL_TABLET | Freq: Every day | ORAL | Status: DC
  Administered 2020-03-30 – 2020-04-07 (×9): 20 mg via ORAL
  Filled 2020-03-30 (×9): qty 1

## 2020-03-30 MED ORDER — IPRATROPIUM-ALBUTEROL 0.5-2.5 (3) MG/3ML IN SOLN
3.0000 mL | Freq: Four times a day (QID) | RESPIRATORY_TRACT | Status: DC
  Administered 2020-03-30 (×3): 3 mL via RESPIRATORY_TRACT
  Filled 2020-03-30 (×3): qty 3

## 2020-03-30 MED ORDER — INSULIN DETEMIR 100 UNIT/ML ~~LOC~~ SOLN
25.0000 [IU] | Freq: Every day | SUBCUTANEOUS | Status: DC
  Administered 2020-03-31: 25 [IU] via SUBCUTANEOUS
  Filled 2020-03-30 (×4): qty 0.25

## 2020-03-30 MED ORDER — ONDANSETRON HCL 4 MG PO TABS: 4.0000 mg | ORAL_TABLET | Freq: Four times a day (QID) | ORAL | Status: DC | PRN

## 2020-03-30 MED ORDER — TRAZODONE HCL 50 MG PO TABS
75.0000 mg | ORAL_TABLET | Freq: Every day | ORAL | Status: DC
  Administered 2020-03-30 – 2020-04-06 (×8): 75 mg via ORAL
  Filled 2020-03-30 (×8): qty 2

## 2020-03-30 MED ORDER — SODIUM CHLORIDE 0.9 % IV SOLN
1.0000 g | INTRAVENOUS | Status: DC
  Administered 2020-03-30: 1 g via INTRAVENOUS
  Filled 2020-03-30: qty 10

## 2020-03-30 NOTE — H&P (Signed)
TRH H&P    Patient Demographics:    Amy Wiggins, is a 87 y.o. female  MRN: [id number]  DOB - [birth year]  Admit Date - 03/29/2020  Referring MD/NP/PA: Stark Jock  Outpatient Primary MD for the patient is Asencion Noble, MD  Patient coming from: home  Chief complaint-fall   HPI:    Amy Wiggins  is a 87 y.o. female, with history of stroke, patent foreman ovale, paroxysmal atrial fibrillation, hypertension, hyperlipidemia, diabetes mellitus, depression, and more presents to the ED with a chief complaint of fall.  Patient reports that she was in the bathroom and he just stood up from the toilet when she fell.  She hit her head on the floor.  She did not lose consciousness.  She reports that before her fall she did not have any chest pain, palpitation, shortness of breath.  She reports that at this time she is having some blurry vision in her left eye.  Patient had her knees and hurt her left hip during the fall.  Caregiver at bedside is not able to offer any more history, and patient is a terrible historian.  Patient reports that she has felt feverish the past few days.  She reports a cough that is nonproductive.  She reports some shortness of breath.  Patient does wear 3 L nasal cannula at baseline.  Patient denies any dysuria.  She denies any bumps or rashes over her body.  Patient denies any diarrhea or vomiting.  Caregiver reports that patient has DNR paperwork-caregiver does not have it with her.  In the ED Temp 100.6, heart rate 96-1 04, respiratory rate 17-32, blood pressure 137/62 White blood cell count 19, hemoglobin 10.0 Chemistry panel unremarkable-BUN 51, creatinine 2.65, creatinine at baseline Lactic acid 1.3 and then 1.2 Blood cultures pending CT C-spine and head showed small amount of acute subarachnoid hemorrhage in setting of trauma, left frontal scalp and supra orbital swelling and soft tissue  hematoma.  No acute fracture of C-spine, C-spine spondylitic changes chronic.  CT lumbar shows no acute fracture or static subluxation of lumbar spine. Chest x-ray shows retrocardiac opacity that could be atelectasis versus infectious X-ray hip shows no acute findings Neurosurgery consulted from ED and reports patient can stay here for repeat CT    Review of systems:    In addition to the HPI above,  Admits to fever and chills, No Headache, No changes with  Hearing, blurry vision s/p fall in left eye No problems swallowing food or Liquids, No Chest pain, admits to cough and dyspnea No Abdominal pain, No Nausea or Vomiting, bowel movements are regular, No Blood in stool or Urine, No dysuria, No new skin rashes or bruises, No new joints pains-aches,  No new weakness, tingling, numbness in any extremity, No recent weight gain or loss, No polyuria, polydypsia or polyphagia, No significant Mental Stressors.  All other systems reviewed and are negative.    Past History of the following :    Past Medical History:  Diagnosis Date  . Chronic anticoagulation   . Depression   .  Diabetes mellitus   . Hyperlipidemia   . Hypertension   . PAF (paroxysmal atrial fibrillation) ([place])   . PFO (patent foramen ovale)   . Stroke ([place])    TIA's x 2      Past Surgical History:  Procedure Laterality Date  . COLONOSCOPY    . COLONOSCOPY N/A 09/09/2014   Procedure: COLONOSCOPY;  Surgeon: Rogene Houston, MD;  Location: [facility];  Service: Endoscopy;  Laterality: N/A;  1010 - moved to 7:30 - Ann to notify  . EP IMPLANTABLE DEVICE N/A 06/07/2015   Procedure: Loop Recorder Insertion;  Surgeon: Deboraha Sprang, MD;  Location: [facility];  Service: Cardiovascular;  Laterality: N/A;  . TEE WITHOUT CARDIOVERSION N/A 06/07/2015   Procedure: TRANSESOPHAGEAL ECHOCARDIOGRAM (TEE);  Surgeon: Thayer Headings, MD;  Location: [facility] ENDOSCOPY;  Service: Cardiovascular;  Laterality: N/A;      Social  History:      Social History   Tobacco Use  . Smoking status: Former Smoker    Years: 10.00  . Smokeless tobacco: Never Used  . Tobacco comment: was an occasional smoker in the past  Substance Use Topics  . Alcohol use: No       Family History :     Family History  Problem Relation Age of Onset  . Stroke Father   . Dementia Sister   . Atrial fibrillation Sister   . Parkinson's disease Sister   . Heart attack Daughter   . Diabetes Son   . Hypertension Son       Home Medications:   Prior to Admission medications   Medication Sig Start Date End Date Taking? Authorizing Provider  acetaminophen (TYLENOL) 500 MG tablet Take 1,000 mg by mouth at bedtime. may also take 2 tablets during the day as needed for pain 08/03/19   [provider]  amiodarone (PACERONE) 200 MG tablet Take 1 tablet (200 mg total) by mouth daily. 08/26/19   Gerlene Fee, NP  atorvastatin (LIPITOR) 20 MG tablet Take 20 mg by mouth daily. 12/03/19   [provider]  buPROPion (WELLBUTRIN XL) 300 MG 24 hr tablet Take 1 tablet (300 mg total) by mouth daily. 08/26/19   Gerlene Fee, NP  citalopram (CELEXA) 20 MG tablet Take 1 tablet (20 mg total) by mouth daily. 08/26/19   Gerlene Fee, NP  diltiazem (CARDIZEM CD) 120 MG 24 hr capsule Take 1 capsule (120 mg total) by mouth daily. 08/26/19   Gerlene Fee, NP  DROPLET PEN NEEDLES 32G X 4 MM MISC  11/30/19   [provider]  ELIQUIS 2.5 MG TABS tablet Take 1 tablet (2.5 mg total) by mouth 2 (two) times daily. 08/26/19   Gerlene Fee, NP  insulin aspart (NOVOLOG FLEXPEN) 100 UNIT/ML FlexPen Inject 10 Units into the skin 3 (three) times daily with meals. insulin pen; 100 unit/mL (3 mL); amt: 10 units; subcutaneous Special Instructions: admin 5 units for CBG >150 Before Meals 08:00 AM, 11:30 AM, 05:00 PM 08/26/19   Nyoka Cowden, Phylis Bougie, NP  insulin glargine (LANTUS) 100 UNIT/ML injection Inject 0.3 mLs (30 Units total) into the skin at  bedtime. 08/26/19   Gerlene Fee, NP  ipratropium-albuterol (DUONEB) 0.5-2.5 (3) MG/3ML SOLN Take 3 mLs by nebulization every 6 (six) hours. 08/26/19   Gerlene Fee, NP  Lancet Devices (TRUEDRAW LANCING DEVICE) MISC  11/30/19   [provider]  Magnesium Oxide 250 MG TABS Take 2 tablets (500 mg total)  by mouth every evening. 08/26/19   Gerlene Fee, NP  mirtazapine (REMERON) 15 MG tablet Take 15 mg by mouth at bedtime. 10/12/19   [provider]  Multiple Vitamin (MULTIVITAMIN WITH MINERALS) TABS tablet Take 1 tablet by mouth at bedtime. For supplement    [provider]  Multiple Vitamins-Minerals (PRESERVISION AREDS 2) CAPS Take 1 capsule by mouth 2 (two) times daily.    [provider]  OXYGEN Inhale 2 L into the lungs continuous. 08/03/19   [provider]  pantoprazole (PROTONIX) 40 MG tablet Take 1 tablet (40 mg total) by mouth daily. For acid reflux/gerd 08/26/19   Gerlene Fee, NP  potassium chloride SA (KLOR-CON) 20 MEQ tablet Take 1 tablet (20 mEq total) by mouth daily. 08/26/19   Gerlene Fee, NP  torsemide (DEMADEX) 20 MG tablet Take 40mg  in am and 20mg  in the evening. You may take an extra tablet as needed 01/28/20   Shirley Friar, PA-C  traZODone (DESYREL) 150 MG tablet Take 0.5 tablets (75 mg total) by mouth at bedtime. 0.5 tablet to = 75 mg 08/26/19   Gerlene Fee, NP  TRUEplus Lancets 33G MISC  11/30/19   [provider]     Allergies:    No Known Allergies   Physical Exam:   Vitals  Blood pressure (!) 121/52, pulse 96, temperature (!) 102.4 F (39.1 C), resp. rate 16, weight 86 kg, SpO2 92 %.  1.  General: Patient lying supine in bed, head of bed elevated, patient very drowsy  2. Psychiatric: Drowsy but oriented x3, cooperative with exam, flat affect  3. Neurologic: Cranial nerves II through XII are intact, moves all 4 extremities voluntarily, equal sensation in the upper and lower extremities  bilaterally, speech and language normal, no acute deficits  4. HEENMT:  Head is traumatic with hematoma over left eye, normocephalic, pupils reactive to light, neck is supple, trachea is midline, mucous membranes are moist  5. Respiratory : Lungs are clear to auscultation bilaterally, no wheeze, no rhonchi, no rales  6. Cardiovascular : Heart rate is normal, rhythm is irregular, systolic murmur present, no peripheral edema  7. Gastrointestinal:  Abdomen is soft, nondistended, nontender to palpation  8. Skin:  Skin is warm dry and intact, bruising over bilateral knees, bruising over left hip, bruising on left forehead  9.Musculoskeletal:  No acute deformities, bruising over left hip, no calf tenderness, no peripheral edema    Data Review:    CBC Recent Labs  Lab 03/30/20 0053  WBC 18.9*  HGB 10.0*  HCT 33.5*  PLT 220  MCV 90.8  MCH 27.1  MCHC 29.9*  RDW 15.6*  LYMPHSABS 0.6*  MONOABS 1.1*  EOSABS 0.0  BASOSABS 0.0   ------------------------------------------------------------------------------------------------------------------  Results for orders placed or performed during the hospital encounter of 03/29/20 (from the past 48 hour(s))  SARS Coronavirus 2 by RT PCR (hospital order, performed in [facility] hospital lab) Nasopharyngeal Nasopharyngeal Swab     Status: None   Collection Time: 03/30/20 12:45 AM   Specimen: Nasopharyngeal Swab  Result Value Ref Range   SARS Coronavirus 2 NEGATIVE NEGATIVE    Comment: (NOTE) SARS-CoV-2 target nucleic acids are NOT DETECTED.  The SARS-CoV-2 RNA is generally detectable in upper and lower respiratory specimens during the acute phase of infection. The lowest concentration of SARS-CoV-2 viral copies this assay can detect is 250 copies / mL. A negative result does not preclude SARS-CoV-2 infection and should not be used as  the sole basis for treatment or other patient management decisions.  A negative result may occur  with improper specimen collection / handling, submission of specimen other than nasopharyngeal swab, presence of viral mutation(s) within the areas targeted by this assay, and inadequate number of viral copies (<250 copies / mL). A negative result must be combined with clinical observations, patient history, and epidemiological information.  Fact Sheet for Patients:   StrictlyIdeas.no  Fact Sheet for Healthcare Providers: BankingDealers.co.za  This test is not yet approved or  cleared by the [place] FDA and has been authorized for detection and/or diagnosis of SARS-CoV-2 by FDA under an Emergency Use Authorization (EUA).  This EUA will remain in effect (meaning this test can be used) for the duration of the COVID-19 declaration under Section 564(b)(1) of the Act, 21 U.S.C. section 360bbb-3(b)(1), unless the authorization is terminated or revoked sooner.  Performed at [facility], [address] [place], [place] [address]   Comprehensive metabolic panel     Status: Abnormal   Collection Time: 03/30/20 12:53 AM  Result Value Ref Range   Sodium 127 (L) 135 - 145 mmol/L   Potassium 3.9 3.5 - 5.1 mmol/L   Chloride 89 (L) 98 - 111 mmol/L   CO2 28 22 - 32 mmol/L   Glucose, Bld 222 (H) 70 - 99 mg/dL    Comment: Glucose reference range applies only to samples taken after fasting for at least 8 hours.   BUN 51 (H) 8 - 23 mg/dL   Creatinine, Ser 2.65 (H) 0.44 - 1.00 mg/dL   Calcium 8.9 8.9 - 10.3 mg/dL   Total Protein 6.7 6.5 - 8.1 g/dL   Albumin 2.5 (L) 3.5 - 5.0 g/dL   AST 57 (H) 15 - 41 U/L   ALT 39 0 - 44 U/L   Alkaline Phosphatase 175 (H) 38 - 126 U/L   Total Bilirubin 0.8 0.3 - 1.2 mg/dL   GFR, Estimated 17 (L) >60 mL/min    Comment: (NOTE) Calculated using the CKD-EPI Creatinine Equation (2021)    Anion gap 10 5 - 15    Comment: Performed at [facility], [address] [place], [place] [address]  CBC with Differential      Status: Abnormal   Collection Time: 03/30/20 12:53 AM  Result Value Ref Range   WBC 18.9 (H) 4.0 - 10.5 K/uL   RBC 3.69 (L) 3.87 - 5.11 MIL/uL   Hemoglobin 10.0 (L) 12.0 - 15.0 g/dL   HCT 33.5 (L) 36.0 - 46.0 %   MCV 90.8 80.0 - 100.0 fL   MCH 27.1 26.0 - 34.0 pg   MCHC 29.9 (L) 30.0 - 36.0 g/dL   RDW 15.6 (H) 11.5 - 15.5 %   Platelets 220 150 - 400 K/uL   nRBC 0.0 0.0 - 0.2 %   Neutrophils Relative % 90 %   Neutro Abs 17.0 (H) 1.7 - 7.7 K/uL   Lymphocytes Relative 3 %   Lymphs Abs 0.6 (L) 0.7 - 4.0 K/uL   Monocytes Relative 6 %   Monocytes Absolute 1.1 (H) 0.1 - 1.0 K/uL   Eosinophils Relative 0 %   Eosinophils Absolute 0.0 0.0 - 0.5 K/uL   Basophils Relative 0 %   Basophils Absolute 0.0 0.0 - 0.1 K/uL   Immature Granulocytes 1 %   Abs Immature Granulocytes 0.20 (H) 0.00 - 0.07 K/uL    Comment: Performed at [facility], [address] [place], [place] [address]  Lactic acid, plasma  Status: None   Collection Time: 03/30/20 12:53 AM  Result Value Ref Range   Lactic Acid, Venous 1.3 0.5 - 1.9 mmol/L    Comment: Performed at [facility], [address], [place], [place] [address]  Lactic acid, plasma     Status: None   Collection Time: 03/30/20  2:54 AM  Result Value Ref Range   Lactic Acid, Venous 1.2 0.5 - 1.9 mmol/L    Comment: Performed at [facility], [address] [place], [place] [address]  Urinalysis, Routine w reflex microscopic Urine, Catheterized     Status: Abnormal   Collection Time: 03/30/20  4:50 AM  Result Value Ref Range   Color, Urine AMBER (A) YELLOW    Comment: BIOCHEMICALS MAY BE AFFECTED BY COLOR   APPearance CLEAR CLEAR   Specific Gravity, Urine 1.009 1.005 - 1.030   pH 5.0 5.0 - 8.0   Glucose, UA 150 (A) NEGATIVE mg/dL   Hgb urine dipstick SMALL (A) NEGATIVE   Bilirubin Urine NEGATIVE NEGATIVE   Ketones, ur NEGATIVE NEGATIVE mg/dL   Protein, ur NEGATIVE NEGATIVE mg/dL   Nitrite NEGATIVE NEGATIVE   Leukocytes,Ua NEGATIVE NEGATIVE   RBC /  HPF 0-5 0 - 5 RBC/hpf   WBC, UA 0-5 0 - 5 WBC/hpf   Bacteria, UA NONE SEEN NONE SEEN   Mucus PRESENT     Comment: Performed at [facility], [address], [place], [place] [address]    Chemistries  Recent Labs  Lab 03/30/20 0053  NA 127*  K 3.9  CL 89*  CO2 28  GLUCOSE 222*  BUN 51*  CREATININE 2.65*  CALCIUM 8.9  AST 57*  ALT 39  ALKPHOS 175*  BILITOT 0.8   ------------------------------------------------------------------------------------------------------------------  ------------------------------------------------------------------------------------------------------------------ GFR: Estimated Creatinine Clearance: 16.2 mL/min (A) (by C-G formula based on SCr of 2.65 mg/dL (H)). Liver Function Tests: Recent Labs  Lab 03/30/20 0053  AST 57*  ALT 39  ALKPHOS 175*  BILITOT 0.8  PROT 6.7  ALBUMIN 2.5*   No results for input(s): LIPASE, AMYLASE in the last 168 hours. No results for input(s): AMMONIA in the last 168 hours. Coagulation Profile: No results for input(s): INR, PROTIME in the last 168 hours. Cardiac Enzymes: No results for input(s): CKTOTAL, CKMB, CKMBINDEX, TROPONINI in the last 168 hours. BNP (last 3 results) No results for input(s): PROBNP in the last 8760 hours. HbA1C: No results for input(s): HGBA1C in the last 72 hours. CBG: No results for input(s): GLUCAP in the last 168 hours. Lipid Profile: No results for input(s): CHOL, HDL, LDLCALC, TRIG, CHOLHDL, LDLDIRECT in the last 72 hours. Thyroid Function Tests: No results for input(s): TSH, T4TOTAL, FREET4, T3FREE, THYROIDAB in the last 72 hours. Anemia Panel: No results for input(s): VITAMINB12, FOLATE, FERRITIN, TIBC, IRON, RETICCTPCT in the last 72 hours.  --------------------------------------------------------------------------------------------------------------- Urine analysis:    Component Value Date/Time   COLORURINE AMBER (A) 03/30/2020 0450   APPEARANCEUR CLEAR 03/30/2020 0450    LABSPEC 1.009 03/30/2020 0450   PHURINE 5.0 03/30/2020 0450   GLUCOSEU 150 (A) 03/30/2020 0450   HGBUR SMALL (A) 03/30/2020 0450   BILIRUBINUR NEGATIVE 03/30/2020 0450   KETONESUR NEGATIVE 03/30/2020 0450   PROTEINUR NEGATIVE 03/30/2020 0450   UROBILINOGEN 0.2 12/12/2010 1102   NITRITE NEGATIVE 03/30/2020 0450   LEUKOCYTESUR NEGATIVE 03/30/2020 0450      Imaging Results:    DG Chest 1 View  Result Date: 03/30/2020 CLINICAL DATA:  Fall with fever EXAM: CHEST  1 VIEW COMPARISON:  None. FINDINGS: The  heart size and mediastinal contours are mildly enlarged . Aortic knob calcifications are seen. Patchy retrocardiac airspace opacity is noted with air bronchograms. The visualized skeletal structures are unremarkable. IMPRESSION: Retrocardiac opacity which could be atelectasis and/or infectious etiology Electronically Signed   By: Prudencio Pair M.D.   On: 03/30/2020 01:52   CT Head Wo Contrast  Result Date: 03/30/2020 CLINICAL DATA:  Fall, fever EXAM: CT HEAD WITHOUT CONTRAST CT CERVICAL SPINE WITHOUT CONTRAST TECHNIQUE: Multidetector CT imaging of the head and cervical spine was performed following the standard protocol without intravenous contrast. Multiplanar CT image reconstructions of the cervical spine were also generated. COMPARISON:  MR 02/22/2016, CT 02/21/2016, MR neck 06/02/2015 FINDINGS: CT HEAD FINDINGS Brain: Curvilinear hyperattenuation is seen along the surface of the precentral gyrus (2/24) concerning for a small amount of acute subarachnoid hemorrhage. No other sites of extra-axial or parenchymal hemorrhage. 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 Additional regions of encephalomalacia is seen in the bilateral occipital lobes and cerebellar hemispheres. No evidence of acute infarction, hydrocephalus, visible mass lesion or mass effect. Symmetric prominence of the ventricles, cisterns and sulci compatible with parenchymal volume loss. Patchy areas of white  matter hypoattenuation are most compatible with chronic microvascular angiopathy. Vascular: Atherosclerotic calcification of the carotid siphons and intradural vertebral arteries. No hyperdense vessel. Skull: Left frontal scalp and supraorbital soft tissue swelling with crescentic scalp hematoma measuring up to 6 mm in maximal thickness. No other significant sites of scalp swelling. No subjacent calvarial fracture or visible facial bone fracture is evident within the included margins of imaging. Hyperostosis frontalis interna is a benign incidental finding. Sinuses/Orbits: Mild nodular mural thickening in the paranasal sinuses. No layering air-fluid levels or pneumatized secretions. Prior bilateral lens extractions. Asymmetric left periorbital and palpebral swelling without retro septal gas, stranding or hemorrhage. Remaining orbital contents are unremarkable. Other: None CT CERVICAL SPINE FINDINGS Alignment: Stabilization collar is absent at the time of exam. Mild straightening of normal cervical lordosis. Light stepwise anterolisthesis C2-C5 and minimal 2 mm of anterolisthesis C6 on C7 favored to be on a spondylitic basis given significant facet arthropathy and uncinate changes. No evidence of traumatic listhesis. No abnormally widened, perched or jumped facets. Normal alignment of the craniocervical and atlantoaxial articulations. Skull base and vertebrae: No acute skull base fracture. No vertebral body fracture or height loss. The osseous structures appear diffusely demineralized which may limit detection of small or nondisplaced fractures. No worrisome osseous lesions. Moderate arthrosis at the atlantodental and basion dens intervals with few small erosions and calcific pannus formation, nonspecific but can be seen with CPPD or rheumatoid arthropathy. Multilevel cervical spondylitic changes as detailed below. Enthesopathic changes noted along the spinous processes and nuchal ligament. Soft tissues and spinal  canal: No pre or paravertebral fluid or swelling. No visible canal hematoma. Cervical carotid atherosclerosis. Airways patent. Disc levels: Multilevel intervertebral disc height loss with spondylitic endplate changes. Larger disc osteophyte complexes are present C3-C5 with some partial effacement of the ventral thecal sac. Additional disc osteophyte complex C6-5-C6 may result in some mild canal stenosis in combination with posterior ligamentum flavum infolding. Multilevel uncinate spurring and facet hypertrophic changes are present throughout the cervical spine as well resulting in mild-to-moderate multilevel neural foraminal narrowing most pronounced at the C3-4 level bilaterally. Upper chest: Atelectatic changes in the lung apices. Calcification of proximal great vessels noted incidentally. Other: No concerning thyroid nodules or masses. IMPRESSION: 1. Curvilinear hyperattenuation along the surface of the left precentral gyrus concerning for  a small amount of acute subarachnoid hemorrhage in the setting of trauma. No other acute intracranial abnormalities. 2. Left frontal scalp and supraorbital swelling and soft tissue hematoma. No subjacent calvarial or visible facial bone fracture. No orbital injury is identified. 3. No acute fracture or traumatic listhesis of the cervical spine. 4. 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 Additional regions of encephalomalacia in the bilateral occipital lobes and cerebellar hemispheres. 5. Multilevel cervical spondylitic changes, as above. 6. Moderate arthrosis at the atlantodental and basion dens intervals with erosions and calcific pannus formation, nonspecific but can be seen with CPPD or rheumatoid arthropathy. 7. Intracranial and cervical atherosclerosis. Critical Value/emergent results were called by telephone at the time of interpretation on 03/30/2020 at 2:24 am to provider Veryl Speak , who verbally acknowledged these results. Electronically Signed    By: Lovena Le M.D.   On: 03/30/2020 02:23   CT Cervical Spine Wo Contrast  Result Date: 03/30/2020 CLINICAL DATA:  Fall, fever EXAM: CT HEAD WITHOUT CONTRAST CT CERVICAL SPINE WITHOUT CONTRAST TECHNIQUE: Multidetector CT imaging of the head and cervical spine was performed following the standard protocol without intravenous contrast. Multiplanar CT image reconstructions of the cervical spine were also generated. COMPARISON:  MR 02/22/2016, CT 02/21/2016, MR neck 06/02/2015 FINDINGS: CT HEAD FINDINGS Brain: Curvilinear hyperattenuation is seen along the surface of the precentral gyrus (2/24) concerning for a small amount of acute subarachnoid hemorrhage. No other sites of extra-axial or parenchymal hemorrhage. 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 Additional regions of encephalomalacia is seen in the bilateral occipital lobes and cerebellar hemispheres. No evidence of acute infarction, hydrocephalus, visible mass lesion or mass effect. Symmetric prominence of the ventricles, cisterns and sulci compatible with parenchymal volume loss. Patchy areas of white matter hypoattenuation are most compatible with chronic microvascular angiopathy. Vascular: Atherosclerotic calcification of the carotid siphons and intradural vertebral arteries. No hyperdense vessel. Skull: Left frontal scalp and supraorbital soft tissue swelling with crescentic scalp hematoma measuring up to 6 mm in maximal thickness. No other significant sites of scalp swelling. No subjacent calvarial fracture or visible facial bone fracture is evident within the included margins of imaging. Hyperostosis frontalis interna is a benign incidental finding. Sinuses/Orbits: Mild nodular mural thickening in the paranasal sinuses. No layering air-fluid levels or pneumatized secretions. Prior bilateral lens extractions. Asymmetric left periorbital and palpebral swelling without retro septal gas, stranding or hemorrhage. Remaining  orbital contents are unremarkable. Other: None CT CERVICAL SPINE FINDINGS Alignment: Stabilization collar is absent at the time of exam. Mild straightening of normal cervical lordosis. Light stepwise anterolisthesis C2-C5 and minimal 2 mm of anterolisthesis C6 on C7 favored to be on a spondylitic basis given significant facet arthropathy and uncinate changes. No evidence of traumatic listhesis. No abnormally widened, perched or jumped facets. Normal alignment of the craniocervical and atlantoaxial articulations. Skull base and vertebrae: No acute skull base fracture. No vertebral body fracture or height loss. The osseous structures appear diffusely demineralized which may limit detection of small or nondisplaced fractures. No worrisome osseous lesions. Moderate arthrosis at the atlantodental and basion dens intervals with few small erosions and calcific pannus formation, nonspecific but can be seen with CPPD or rheumatoid arthropathy. Multilevel cervical spondylitic changes as detailed below. Enthesopathic changes noted along the spinous processes and nuchal ligament. Soft tissues and spinal canal: No pre or paravertebral fluid or swelling. No visible canal hematoma. Cervical carotid atherosclerosis. Airways patent. Disc levels: Multilevel intervertebral disc height loss with spondylitic endplate  changes. Larger disc osteophyte complexes are present C3-C5 with some partial effacement of the ventral thecal sac. Additional disc osteophyte complex C6-5-C6 may result in some mild canal stenosis in combination with posterior ligamentum flavum infolding. Multilevel uncinate spurring and facet hypertrophic changes are present throughout the cervical spine as well resulting in mild-to-moderate multilevel neural foraminal narrowing most pronounced at the C3-4 level bilaterally. Upper chest: Atelectatic changes in the lung apices. Calcification of proximal great vessels noted incidentally. Other: No concerning thyroid nodules  or masses. IMPRESSION: 1. Curvilinear hyperattenuation along the surface of the left precentral gyrus concerning for a small amount of acute subarachnoid hemorrhage in the setting of trauma. No other acute intracranial abnormalities. 2. Left frontal scalp and supraorbital swelling and soft tissue hematoma. No subjacent calvarial or visible facial bone fracture. No orbital injury is identified. 3. No acute fracture or traumatic listhesis of the cervical spine. 4. 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 Additional regions of encephalomalacia in the bilateral occipital lobes and cerebellar hemispheres. 5. Multilevel cervical spondylitic changes, as above. 6. Moderate arthrosis at the atlantodental and basion dens intervals with erosions and calcific pannus formation, nonspecific but can be seen with CPPD or rheumatoid arthropathy. 7. Intracranial and cervical atherosclerosis. Critical Value/emergent results were called by telephone at the time of interpretation on 03/30/2020 at 2:24 am to provider Veryl Speak , who verbally acknowledged these results. Electronically Signed   By: Lovena Le M.D.   On: 03/30/2020 02:23   CT Lumbar Spine Wo Contrast  Result Date: 03/30/2020 CLINICAL DATA:  Trauma and low back pain.  Fall.  Fever. EXAM: CT LUMBAR SPINE WITHOUT CONTRAST TECHNIQUE: Multidetector CT imaging of the lumbar spine was performed without intravenous contrast administration. Multiplanar CT image reconstructions were also generated. COMPARISON:  None. FINDINGS: Segmentation: Standard Alignment: Normal Vertebrae: No acute fracture or focal pathologic process. Paraspinal and other soft tissues: Calcific aortic atherosclerosis. Cholelithiasis. Disc levels: Multilevel degenerative disc disease without high-grade spinal canal or neural foraminal stenosis. Degenerative changes are greatest at L4-5 and L5-S1. IMPRESSION: 1. No acute fracture or static subluxation of the lumbar spine. 2. Aortic  Atherosclerosis (ICD10-I70.0). Electronically Signed   By: Ulyses Jarred M.D.   On: 03/30/2020 02:13   DG Hip Unilat W or Wo Pelvis 2-3 Views Left  Result Date: 03/30/2020 CLINICAL DATA:  Fall EXAM: DG HIP (WITH OR WITHOUT PELVIS) 2-3V LEFT COMPARISON:  None. FINDINGS: There is no evidence of hip fracture or dislocation. Moderate left hip osteoarthritis is seen with superior joint space loss and marginal osteophyte formation. There is diffuse osteopenia. There is no evidence of arthropathy or other focal bone abnormality. Scattered vascular calcifications are noted. IMPRESSION: No acute osseous abnormality Electronically Signed   By: Prudencio Pair M.D.   On: 03/30/2020 01:50    My personal review of EKG: Rhythm A fib, Rate 102 /min, QTc 454 ,no Acute ST changes   Assessment & Plan:    Active Problems:   Subarachnoid hemorrhage ([place])   1. Subarachnoid hemorrhage 1. Secondary to fall 2. CT head shows small amount of acute subarachnoid hemorrhage in the setting of trauma 3. Neurosurgery reports that patient can stay here [place], repeat CT scan in 12 hours 4. Neurochecks 5. Holding Eliquis 6. Continue to monitor 2. Fall 1. X-ray hip showed nothing acute, CT lumbar showed no acute fracture or static subluxation of lumbar spine, CT C-spine showed no acute fracture T-spine, chronic C-spine spondylitic changes 2. PT eval and treat 3.  Continue to monitor 3. Sepsis secondary to pneumonia 1. Patient has white blood cell count of 19, heart rate of 204, febrile with a Foley temp of 39+ degrees Celsius 2. UA is not indicative of UTI 3. Chest x-ray shows retrocardiac opacity that could be atelectasis versus infectious 4. Patient reports that she has been coughing and has some shortness of breath 5. She wears a baseline 3 L nasal cannula at home, had to temporarily be turned up to 4 L nasal cannula in the ER to maintain her oxygen saturations 6. Treat with Rocephin and Zithromax 7. Continue to  monitor 4. Pneumonia 1. See plan above 5. A. Fib 1. Holding Eliquis 2. Continue amiodarone and diltiazem 3. Continue to monitor 6. Depression 1. Holding bupropion continue Celexa 7. Diabetes mellitus type 2 1. Patient takes 30 units long-acting insulin at home 2. Reduced to 25 units long-acting insulin here in the hospital, sliding scale coverage, hemoglobin A1c pending 8.    DVT Prophylaxis-  SCDs   AM Labs Ordered, also please review Full Orders  Family Communication: Admission, patients condition and plan of care including tests being ordered have been discussed with the patient and caregiver who indicate understanding and agree with the plan and Code Status.  Code Status:  DNR  Admission status: Inpatient :The appropriate admission status for this patient is INPATIENT. Inpatient status is judged to be reasonable and necessary in order to provide the required intensity of service to ensure the patient's safety. The patient's presenting symptoms, physical exam findings, and initial radiographic and laboratory data in the context of their chronic comorbidities is felt to place them at high risk for further clinical deterioration. Furthermore, it is not anticipated that the patient will be medically stable for discharge from the hospital within 2 midnights of admission. The following factors support the admission status of inpatient.   * I certify that at the point of admission it is my clinical judgment that the patient will require inpatient hospital care spanning beyond 2 midnights from the point of admission due to high intensity of service, high risk for further deterioration and high frequency of surveillance required.*  Time spent in minutes : [place]

## 2020-03-30 NOTE — TOC Initial Note (Signed)
Transition of Care [facility]) - Initial/Assessment Note    Patient Details  Name: Amy Wiggins MRN: [id number] Date of Birth: May 02, 1931  Transition of Care [facility]) CM/SW Contact:    Shade Flood, LCSW Phone Number: 03/30/2020, 1:20 PM  Clinical Narrative:                  Pt admitted from home. PT recommending SNF rehab at dc. Spoke with pt's granddaughter to discuss. Granddaughter states that pt does not do well at SNF and they have 24/7 caregivers for pt at home and they prefer pt go home with HHPT. Pt has walker, wheelchair, BSC, shower chair for DME.   Reviewed [facility] provider options and referred to [facility] at granddaughter request.   Updated MD and RN. TOC will follow.   Expected Discharge Plan: [place] Barriers to Discharge: Continued Medical Work up   Patient Goals and CMS Choice Patient states their goals for this hospitalization and ongoing recovery are:: go home CMS Medicare.gov Compare Post Acute Care list provided to:: Patient Represenative (must comment) Choice offered to / list presented to : Adult Children  Expected Discharge Plan and Services Expected Discharge Plan: [place] In-house Referral: Clinical Social Work   Post Acute Care Choice: [place] arrangements for the past 2 months: [place]: RN,PT [place] Agency: [place] Date [place]: 03/30/20   Representative spoke with at [place]: Tommi Rumps  Prior Living Arrangements/Services Living arrangements for the past 2 months: [place] Lives with:: Self Patient language and need for interpreter reviewed:: Yes Do you feel safe going back to the place where you live?: Yes      Need for Family Participation in Patient Care: Yes (Comment) Care giver support system in place?: Yes (comment) Current home services: DME Criminal Activity/Legal Involvement Pertinent to Current  Situation/Hospitalization: No - Comment as needed  Activities of Daily Living Home Assistive Devices/Equipment: Oxygen,Walker (specify type),Shower chair with back,Grab bars in shower ADL Screening (condition at time of admission) Patient's cognitive ability adequate to safely complete daily activities?: Yes Is the patient deaf or have difficulty hearing?: No Does the patient have difficulty seeing, even when wearing glasses/contacts?: No Does the patient have difficulty concentrating, remembering, or making decisions?: Yes Patient able to express need for assistance with ADLs?: Yes Does the patient have difficulty dressing or bathing?: No Independently performs ADLs?: Yes (appropriate for developmental age) Does the patient have difficulty walking or climbing stairs?: Yes Weakness of Legs: Both Weakness of Arms/Hands: None  Permission Sought/Granted Permission sought to share information with : Chartered certified accountant granted to share information with : Yes, Verbal Permission Granted     Permission granted to share info w AGENCY: [facility]        Emotional Assessment       Orientation: : Oriented to Self,Oriented to Situation,Oriented to Place Alcohol / Substance Use: Not Applicable Psych Involvement: No (comment)  Admission diagnosis:  Subarachnoid hemorrhage ([place]) [I60.9] Patient Active Problem List   Diagnosis Date Noted  . Subarachnoid hemorrhage ([place]) 03/30/2020  . Anemia, iron deficiency 11/06/2019  . Acute on chronic anemia 09/09/2019  . HCAP (healthcare-associated pneumonia) 08/26/2019  . Hypertension associated with stage 4 chronic kidney disease due to type 2 diabetes mellitus ([place]) 08/04/2019  .  Hyperlipidemia associated with type 2 diabetes mellitus ([place]) 08/04/2019  . CKD stage 4 due to type 2 diabetes mellitus ([place]) 08/04/2019  . Gastroesophageal reflux disease 08/04/2019  . Chronic constipation 08/04/2019  . Major depression, recurrent,  chronic ([place]) 08/04/2019  . Hypomagnesemia 08/04/2019  . Ground glass opacity present on imaging of lung 08/04/2019  . Goals of care, counseling/discussion   . Palliative care by specialist   . Acute on chronic diastolic (congestive) heart failure ([place]) 07/24/2019  . Acute on chronic respiratory failure with hypoxia ([place]) 09/29/2018  . Atrial fibrillation with RVR ([place])   . Atrial fibrillation, chronic ([place]) 09/28/2018  . Cerebellar infarct ([place])   . Stroke ([place]) 02/22/2016  . HFpEF/Chronic diastolic congestive heart failure ([place]) 02/22/2016  . Shortness of breath 01/14/2016  . Cerebrovascular accident (CVA) due to embolism of right posterior cerebral artery ([place]) 08/08/2015  . Essential hypertension 08/08/2015  . HLD (hyperlipidemia) 08/08/2015  . Type 2 diabetes mellitus with circulatory disorder ([place]) 08/08/2015  . PFO (patent foramen ovale)   . Hyperlipidemia 06/02/2015  . CKD (chronic kidney disease) stage 4, GFR 15-29 ml/min (HCC) 06/02/2015  . DM type 2 (diabetes mellitus, type 2) ([place]) 06/02/2015  . Leukocytosis 06/02/2015  . Muscle weakness (generalized) 09/16/2012  . Pain in joint, shoulder region 09/16/2012   PCP:  Asencion Noble, MD Pharmacy:   [facility] [facility], [place] [address] [place] [address] Phone: [phone] Fax: [place] [facility] [place], [place] [address] Phone: [phone] Fax: [phone]     Social Determinants of Health (SDOH) Interventions    Readmission Risk Interventions Readmission Risk Prevention Plan 09/10/2019  Transportation Screening Complete  PCP or Specialist Appt within 3-5 Days Not Complete  Not Complete comments Granddaughter assists patient with scheduling appointments  [place] or [place] Complete  Social Work Consult for [place] Planning/Counseling Complete  Palliative Care Screening Not  Applicable  Medication Review Press photographer) Complete  Some recent data might be hidden

## 2020-03-30 NOTE — Progress Notes (Signed)
Pharmacy Antibiotic Note  Amy Wiggins is a 87 y.o. female admitted on 03/29/2020 with pneumonia and bacteremia.  Pharmacy has been consulted for Vancomycin and Cefepime dosing.  Plan: Vancomycin 1750 mg IV x 1 dose. Vancomycin 750 mg IV every 48 hours. Cefepime 2000 mg IV every 24 hours. Monitor labs, c/s, and vanco level as indicated.  Weight: 86 kg (189 lb 9.5 oz)  Temp (24hrs), Avg:100.7 F (38.2 C), Min:99 F (37.2 C), Max:102.7 F (39.3 C)  Recent Labs  Lab 03/30/20 0053 03/30/20 0254  WBC 18.9*  --   CREATININE 2.65*  --   LATICACIDVEN 1.3 1.2    Estimated Creatinine Clearance: 16.2 mL/min (A) (by C-G formula based on SCr of 2.65 mg/dL (H)).    No Known Allergies  Antimicrobials this admission: Vanco 2/3 >>  Cefepime 2/3 >>  Azith/CTX 2/3   Microbiology results: 2/3 BCx: gram + cocci 2/3 UCx: pending    Thank you for allowing pharmacy to be a part of this patient's care.  Ramond Craver 03/30/2020 3:56 PM

## 2020-03-30 NOTE — ED Provider Notes (Signed)
[facility] EMERGENCY DEPARTMENT Provider Note   CSN: [id number] Arrival date & time: 03/29/20  2141     History Chief Complaint  Patient presents with  . Fall    Amy Wiggins is a 87 y.o. female.  Patient is an 87 year old female with extensive past medical history including atrial fibrillation on Eliquis, diabetes, hypertension, prior stroke, chronic renal insufficiency.  Patient presents today for evaluation of fall.  According to the caretaker at bedside, patient has been running intermittent fevers for the past several days.  She was said to have had a Covid test 2 days ago which was negative.  This evening when attempting to get off the toilet, she became weak and fell.  She struck her head on the floor.  There was no loss of consciousness but she is complaining of discomfort in her head, neck, low back, and left hip.  Patient denies recent symptoms such as cough, dysuria, abdominal pain, or diarrhea.  The history is provided by the patient.  Fall This is a new problem. The current episode started 1 to 2 hours ago. The problem occurs constantly. The problem has not changed since onset.Associated symptoms include headaches. Pertinent negatives include no chest pain and no shortness of breath. Nothing aggravates the symptoms. Nothing relieves the symptoms. She has tried nothing for the symptoms.       Past Medical History:  Diagnosis Date  . Chronic anticoagulation   . Depression   . Diabetes mellitus   . Hyperlipidemia   . Hypertension   . PAF (paroxysmal atrial fibrillation) ([place])   . PFO (patent foramen ovale)   . Stroke ([place])    TIA's x 2    Patient Active Problem List   Diagnosis Date Noted  . Anemia, iron deficiency 11/06/2019  . Acute on chronic anemia 09/09/2019  . HCAP (healthcare-associated pneumonia) 08/26/2019  . Hypertension associated with stage 4 chronic kidney disease due to type 2 diabetes mellitus ([place]) 08/04/2019  . Hyperlipidemia associated with  type 2 diabetes mellitus ([place]) 08/04/2019  . CKD stage 4 due to type 2 diabetes mellitus ([place]) 08/04/2019  . Gastroesophageal reflux disease 08/04/2019  . Chronic constipation 08/04/2019  . Major depression, recurrent, chronic ([place]) 08/04/2019  . Hypomagnesemia 08/04/2019  . Ground glass opacity present on imaging of lung 08/04/2019  . Goals of care, counseling/discussion   . Palliative care by specialist   . Acute on chronic diastolic (congestive) heart failure ([place]) 07/24/2019  . Acute on chronic respiratory failure with hypoxia ([place]) 09/29/2018  . Atrial fibrillation with RVR ([place])   . Atrial fibrillation, chronic ([place]) 09/28/2018  . Cerebellar infarct ([place])   . Stroke ([place]) 02/22/2016  . HFpEF/Chronic diastolic congestive heart failure ([place]) 02/22/2016  . Shortness of breath 01/14/2016  . Cerebrovascular accident (CVA) due to embolism of right posterior cerebral artery ([place]) 08/08/2015  . Essential hypertension 08/08/2015  . HLD (hyperlipidemia) 08/08/2015  . Type 2 diabetes mellitus with circulatory disorder ([place]) 08/08/2015  . PFO (patent foramen ovale)   . Hyperlipidemia 06/02/2015  . CKD (chronic kidney disease) stage 4, GFR 15-29 ml/min (HCC) 06/02/2015  . DM type 2 (diabetes mellitus, type 2) ([place]) 06/02/2015  . Leukocytosis 06/02/2015  . Muscle weakness (generalized) 09/16/2012  . Pain in joint, shoulder region 09/16/2012    Past Surgical History:  Procedure Laterality Date  . COLONOSCOPY    . COLONOSCOPY N/A 09/09/2014   Procedure: COLONOSCOPY;  Surgeon: Rogene Houston, MD;  Location[facility];  Service: Endoscopy;  Laterality: N/A;  1010 - moved to 7:30 - Ann to notify  . EP IMPLANTABLE DEVICE N/A 06/07/2015   Procedure: Loop Recorder Insertion;  Surgeon: Deboraha Sprang, MD;  Location: [facility] LAB;  Service: Cardiovascular;  Laterality: N/A;  . TEE WITHOUT CARDIOVERSION N/A 06/07/2015   Procedure: TRANSESOPHAGEAL ECHOCARDIOGRAM (TEE);  Surgeon: Thayer Headings, MD;  Location: [facility] ENDOSCOPY;  Service: Cardiovascular;  Laterality: N/A;     OB History   No obstetric history on file.     Family History  Problem Relation Age of Onset  . Stroke Father   . Dementia Sister   . Atrial fibrillation Sister   . Parkinson's disease Sister   . Heart attack Daughter   . Diabetes Son   . Hypertension Son     Social History   Tobacco Use  . Smoking status: Former Smoker    Years: 10.00  . Smokeless tobacco: Never Used  . Tobacco comment: was an occasional smoker in the past  Vaping Use  . Vaping Use: Never used  Substance Use Topics  . Alcohol use: No  . Drug use: No    Home Medications Prior to Admission medications   Medication Sig Start Date End Date Taking? Authorizing Provider  acetaminophen (TYLENOL) 500 MG tablet Take 1,000 mg by mouth at bedtime. may also take 2 tablets during the day as needed for pain 08/03/19   [provider]  amiodarone (PACERONE) 200 MG tablet Take 1 tablet (200 mg total) by mouth daily. 08/26/19   Gerlene Fee, NP  atorvastatin (LIPITOR) 20 MG tablet Take 20 mg by mouth daily. 12/03/19   [provider]  buPROPion (WELLBUTRIN XL) 300 MG 24 hr tablet Take 1 tablet (300 mg total) by mouth daily. 08/26/19   Gerlene Fee, NP  citalopram (CELEXA) 20 MG tablet Take 1 tablet (20 mg total) by mouth daily. 08/26/19   Gerlene Fee, NP  diltiazem (CARDIZEM CD) 120 MG 24 hr capsule Take 1 capsule (120 mg total) by mouth daily. 08/26/19   Gerlene Fee, NP  DROPLET PEN NEEDLES 32G X 4 MM MISC  11/30/19   [provider]  ELIQUIS 2.5 MG TABS tablet Take 1 tablet (2.5 mg total) by mouth 2 (two) times daily. 08/26/19   Gerlene Fee, NP  insulin aspart (NOVOLOG FLEXPEN) 100 UNIT/ML FlexPen Inject 10 Units into the skin 3 (three) times daily with meals. insulin pen; 100 unit/mL (3 mL); amt: 10 units; subcutaneous Special Instructions: admin 5 units for CBG >150 Before Meals 08:00 AM, 11:30 AM,  05:00 PM 08/26/19   Nyoka Cowden, Phylis Bougie, NP  insulin glargine (LANTUS) 100 UNIT/ML injection Inject 0.3 mLs (30 Units total) into the skin at bedtime. 08/26/19   Gerlene Fee, NP  ipratropium-albuterol (DUONEB) 0.5-2.5 (3) MG/3ML SOLN Take 3 mLs by nebulization every 6 (six) hours. 08/26/19   Gerlene Fee, NP  Lancet Devices (TRUEDRAW LANCING DEVICE) MISC  11/30/19   [provider]  Magnesium Oxide 250 MG TABS Take 2 tablets (500 mg total) by mouth every evening. 08/26/19   Gerlene Fee, NP  mirtazapine (REMERON) 15 MG tablet Take 15 mg by mouth at bedtime. 10/12/19   [provider]  Multiple Vitamin (MULTIVITAMIN WITH MINERALS) TABS tablet Take 1 tablet by mouth at bedtime. For supplement    [provider]  Multiple Vitamins-Minerals (PRESERVISION AREDS 2) CAPS Take 1 capsule by mouth 2 (two) times daily.  [provider]  OXYGEN Inhale 2 L into the lungs continuous. 08/03/19   [provider]  pantoprazole (PROTONIX) 40 MG tablet Take 1 tablet (40 mg total) by mouth daily. For acid reflux/gerd 08/26/19   Gerlene Fee, NP  potassium chloride SA (KLOR-CON) 20 MEQ tablet Take 1 tablet (20 mEq total) by mouth daily. 08/26/19   Gerlene Fee, NP  torsemide (DEMADEX) 20 MG tablet Take 40mg  in am and 20mg  in the evening. You may take an extra tablet as needed 01/28/20   Shirley Friar, PA-C  traZODone (DESYREL) 150 MG tablet Take 0.5 tablets (75 mg total) by mouth at bedtime. 0.5 tablet to = 75 mg 08/26/19   Gerlene Fee, NP  TRUEplus Lancets 33G MISC  11/30/19   [provider]    Allergies    Patient has no known allergies.  Review of Systems   Review of Systems  Respiratory: Negative for shortness of breath.   Cardiovascular: Negative for chest pain.  Neurological: Positive for headaches.  All other systems reviewed and are negative.   Physical Exam Updated Vital Signs BP 139/63   Pulse 97   Temp (!) 100.6 F (38.1 C)  (Rectal)   Resp (!) 23   Wt 86 kg   SpO2 100%   BMI 32.54 kg/m   Physical Exam Vitals and nursing note reviewed.  Constitutional:      General: She is not in acute distress.    Appearance: She is well-developed and well-nourished. She is not diaphoretic.  HENT:     Head: Normocephalic and atraumatic.  Eyes:     Extraocular Movements: Extraocular movements intact.     Pupils: Pupils are equal, round, and reactive to light.  Cardiovascular:     Rate and Rhythm: Normal rate and regular rhythm.     Heart sounds: No murmur heard. No friction rub. No gallop.   Pulmonary:     Effort: Pulmonary effort is normal. No respiratory distress.     Breath sounds: Normal breath sounds. No wheezing.  Abdominal:     General: Bowel sounds are normal. There is no distension.     Palpations: Abdomen is soft.     Tenderness: There is no abdominal tenderness.  Musculoskeletal:        General: Tenderness present. No swelling. Normal range of motion.     Cervical back: Normal range of motion and neck supple.     Right lower leg: No edema.     Left lower leg: No edema.     Comments: There is some tenderness over the lateral aspect of the left hip.  She has mild pain with range of motion.  Distal PMS is intact.  There are several skin tears to the left forearm.  Skin:    General: Skin is warm and dry.  Neurological:     General: No focal deficit present.     Mental Status: She is alert and oriented to person, place, and time.     Cranial Nerves: No cranial nerve deficit.     Sensory: No sensory deficit.     Motor: No weakness.     Coordination: Coordination normal.     ED Results / Procedures / Treatments   Labs (all labs ordered are listed, but only abnormal results are displayed) Labs Reviewed  SARS CORONAVIRUS 2 BY RT PCR ([facility])  URINE CULTURE  CULTURE, BLOOD (ROUTINE X 2)  CULTURE, BLOOD (ROUTINE X  2)  COMPREHENSIVE METABOLIC PANEL   CBC WITH DIFFERENTIAL/PLATELET  LACTIC ACID, PLASMA  LACTIC ACID, PLASMA  URINALYSIS, ROUTINE W REFLEX MICROSCOPIC    EKG None  Radiology No results found.  Procedures Procedures   Medications Ordered in ED Medications  morphine 2 MG/ML injection 2 mg (has no administration in time range)    ED Course  I have reviewed the triage vital signs and the nursing notes.  Pertinent labs & imaging results that were available during my care of the patient were reviewed by me and considered in my medical decision making (see chart for details).    MDM Rules/Calculators/A&P  Patient is a an 87 year old female brought by EMS after a fall at home.  Patient attempted to get up off the toilet, the fell and struck her head.  She takes Eliquis for A. Fib.  Patient has a swollen, ecchymotic area to the left eyebrow, but no laceration.  She appears neurologically intact.  CT scan of the head, cervical spine, lumbar spine were obtained showing what appears to be a small, acute subarachnoid hemorrhage.  This finding was discussed with neurosurgery who feels as though the patient is stable to stay at [facility].  She should be observed and have repeat CT scan in the near future.  Patient also arrives here with low-grade fever, the etiology of which I am uncertain.  Cultures of the blood have been obtained as well as a urinalysis and urine culture.  She does have a white count of 19,000, however the lactate is normal.  Covid has returned negative.  Patient will be admitted to the hospitalist service for further evaluation and observation.  CRITICAL CARE Performed by: Veryl Speak Total critical care time: 45 minutes Critical care time was exclusive of separately billable procedures and treating other patients. Critical care was necessary to treat or prevent imminent or life-threatening deterioration. Critical care was time spent personally by me on the following activities: development of  treatment plan with patient and/or surrogate as well as nursing, discussions with consultants, evaluation of patient's response to treatment, examination of patient, obtaining history from patient or surrogate, ordering and performing treatments and interventions, ordering and review of laboratory studies, ordering and review of radiographic studies, pulse oximetry and re-evaluation of patient's condition.   Final Clinical Impression(s) / ED Diagnoses Final diagnoses:  None    Rx / DC Orders ED Discharge Orders    None       Veryl Speak, MD 03/30/20 [phone]

## 2020-03-30 NOTE — Progress Notes (Addendum)
ASSUMPTION OF CARE NOTE   03/30/2020 3:42 PM  Amy Wiggins was seen and examined.  The H&P by the admitting provider, orders, imaging was reviewed.  Please see new orders.  Will continue to follow.   Awaiting repeat CT brain to follow up small SAH.  Test has been ordered, still waiting for it to be done. BC x 2 positive for GPC.  Continue current antibiotics, Awaiting C&S.  Update: Repeated CT reviewed: stable, no change in hemorrhage reported.    Vitals:   03/30/20 1500 03/30/20 1530  BP: (!) 111/48 (!) 91/42  Pulse:    Resp: 17 14  Temp: 99.5 F (37.5 C) 99 F (37.2 C)  SpO2: 99%     Results for orders placed or performed during the hospital encounter of 03/29/20  SARS Coronavirus 2 by RT PCR (hospital order, performed in [facility] hospital lab) Nasopharyngeal Nasopharyngeal Swab   Specimen: Nasopharyngeal Swab  Result Value Ref Range   SARS Coronavirus 2 NEGATIVE NEGATIVE  Culture, blood (routine x 2)   Specimen: BLOOD  Result Value Ref Range   Specimen Description BLOOD    Special Requests Normal    Culture  Setup Time      GRAM POSITIVE COCCI IN BOTH AEROBIC AND ANAEROBIC BOTTLES Gram Stain Report Called to,Read Back By and Verified With: WHITE,M RN @1510  03/30/20 BY JONES,T [facility]    Culture      NO GROWTH < 12 HOURS Performed at [facility], [address], [place], [place] [address]    Report Status PENDING   Culture, blood (routine x 2)   Specimen: BLOOD  Result Value Ref Range   Specimen Description BLOOD LEFT ANTECUBITAL    Special Requests      BOTTLES DRAWN AEROBIC AND ANAEROBIC Blood Culture adequate volume   Culture  Setup Time      GRAM POSITIVE COCCI IN BOTH AEROBIC AND ANAEROBIC BOTTLES Gram Stain Report Called to,Read Back By and Verified With: WHITE,M RN @1510  03/30/20 BY JONES,T [facility]    Culture      NO GROWTH < 12 HOURS Performed at [facility], [address] [place], [place] [address]    Report Status PENDING   Comprehensive metabolic panel   Result Value Ref Range   Sodium 127 (L) 135 - 145 mmol/L   Potassium 3.9 3.5 - 5.1 mmol/L   Chloride 89 (L) 98 - 111 mmol/L   CO2 28 22 - 32 mmol/L   Glucose, Bld 222 (H) 70 - 99 mg/dL   BUN 51 (H) 8 - 23 mg/dL   Creatinine, Ser 2.65 (H) 0.44 - 1.00 mg/dL   Calcium 8.9 8.9 - 10.3 mg/dL   Total Protein 6.7 6.5 - 8.1 g/dL   Albumin 2.5 (L) 3.5 - 5.0 g/dL   AST 57 (H) 15 - 41 U/L   ALT 39 0 - 44 U/L   Alkaline Phosphatase 175 (H) 38 - 126 U/L   Total Bilirubin 0.8 0.3 - 1.2 mg/dL   GFR, Estimated 17 (L) >60 mL/min   Anion gap 10 5 - 15  CBC with Differential  Result Value Ref Range   WBC 18.9 (H) 4.0 - 10.5 K/uL   RBC 3.69 (L) 3.87 - 5.11 MIL/uL   Hemoglobin 10.0 (L) 12.0 - 15.0 g/dL   HCT 33.5 (L) 36.0 - 46.0 %   MCV 90.8 80.0 - 100.0 fL   MCH 27.1 26.0 - 34.0 pg   MCHC 29.9 (L) 30.0 - 36.0 g/dL  RDW 15.6 (H) 11.5 - 15.5 %   Platelets 220 150 - 400 K/uL   nRBC 0.0 0.0 - 0.2 %   Neutrophils Relative % 90 %   Neutro Abs 17.0 (H) 1.7 - 7.7 K/uL   Lymphocytes Relative 3 %   Lymphs Abs 0.6 (L) 0.7 - 4.0 K/uL   Monocytes Relative 6 %   Monocytes Absolute 1.1 (H) 0.1 - 1.0 K/uL   Eosinophils Relative 0 %   Eosinophils Absolute 0.0 0.0 - 0.5 K/uL   Basophils Relative 0 %   Basophils Absolute 0.0 0.0 - 0.1 K/uL   Immature Granulocytes 1 %   Abs Immature Granulocytes 0.20 (H) 0.00 - 0.07 K/uL  Lactic acid, plasma  Result Value Ref Range   Lactic Acid, Venous 1.3 0.5 - 1.9 mmol/L  Lactic acid, plasma  Result Value Ref Range   Lactic Acid, Venous 1.2 0.5 - 1.9 mmol/L  Urinalysis, Routine w reflex microscopic Urine, Catheterized  Result Value Ref Range   Color, Urine AMBER (A) YELLOW   APPearance CLEAR CLEAR   Specific Gravity, Urine 1.009 1.005 - 1.030   pH 5.0 5.0 - 8.0   Glucose, UA 150 (A) NEGATIVE mg/dL   Hgb urine dipstick SMALL (A) NEGATIVE   Bilirubin Urine NEGATIVE NEGATIVE   Ketones, ur NEGATIVE NEGATIVE mg/dL   Protein, ur NEGATIVE NEGATIVE mg/dL   Nitrite  NEGATIVE NEGATIVE   Leukocytes,Ua NEGATIVE NEGATIVE   RBC / HPF 0-5 0 - 5 RBC/hpf   WBC, UA 0-5 0 - 5 WBC/hpf   Bacteria, UA NONE SEEN NONE SEEN   Mucus PRESENT   CBG monitoring, ED  Result Value Ref Range   Glucose-Capillary 173 (H) 70 - 99 mg/dL  CBG monitoring, ED  Result Value Ref Range   Glucose-Capillary 124 (H) 70 - 99 mg/dL   Murvin Natal, MD [facility]   03/29/2020  9:42 PM How to contact the [facility] Attending or Consulting provider [place] or covering provider during after hours 7P -7A, for this patient?  1. Check the care team in [facility] and look for a) attending/consulting [facility] provider listed and b) the [facility] team listed 2. Log into [email or web address] and use [place]'s universal password to access. If you do not have the password, please contact the hospital operator. 3. Locate the [facility] provider you are looking for under [facility] and page to a number that you can be directly reached. 4. If you still have difficulty reaching the provider, please page the [facility] (Director on Call) for the Hospitalists listed on amion for assistance.

## 2020-03-30 NOTE — Plan of Care (Signed)
  Problem: Acute Rehab PT Goals(only PT should resolve) Goal: Pt Will Go Supine/Side To Sit Outcome: Progressing Flowsheets (Taken 03/30/2020 1149) Pt will go Supine/Side to Sit: with minimal assist Goal: Patient Will Transfer Sit To/From Stand Outcome: Progressing Flowsheets (Taken 03/30/2020 1149) Patient will transfer sit to/from stand:  with minimal assist  with moderate assist Goal: Pt Will Transfer Bed To Chair/Chair To Bed Outcome: Progressing Flowsheets (Taken 03/30/2020 1149) Pt will Transfer Bed to Chair/Chair to Bed:  with min assist  with mod assist Goal: Pt Will Ambulate Outcome: Progressing Flowsheets (Taken 03/30/2020 1149) Pt will Ambulate:  25 feet  with minimal assist  with moderate assist  with rolling walker   11:49 AM, 03/30/20 Lonell Grandchild, MPT Physical Therapist with [facility] [phone] office [phone] mobile phone

## 2020-03-30 NOTE — ED Notes (Signed)
CRITICAL VALUE ALERT  Critical Value:  Both Anaerobic & Aerobic blood culture bottles (for a total of 4 bottles) were positive for gram positive cocci  Date & Time Notied:  03/30/20   Provider Notified: Dr. Wynetta Emery  Orders Received/Actions taken: MD notified by page

## 2020-03-30 NOTE — Evaluation (Signed)
Physical Therapy Evaluation Patient Details Name: Amy Wiggins MRN: [id number] DOB: [birth year] Today's Date: 03/30/2020   History of Present Illness  Amy Wiggins  is a 87 y.o. female, with history of stroke, patent foreman ovale, paroxysmal atrial fibrillation, hypertension, hyperlipidemia, diabetes mellitus, depression, and more presents to the ED with a chief complaint of fall.  Patient reports that she was in the bathroom and he just stood up from the toilet when she fell.  She hit her head on the floor.  She did not lose consciousness.  She reports that before her fall she did not have any chest pain, palpitation, shortness of breath.  She reports that at this time she is having some blurry vision in her left eye.  Patient had her knees and hurt her left hip during the fall.  Caregiver at bedside is not able to offer any more history, and patient is a terrible historian.    Clinical Impression  Patient demonstrates slow labored movement for sitting up at bedside with c/o increased pain with pressure to BLE due to numerous bruises, very unsteady on feet, at high risk for falls and limited to a few side steps before requesting to sit down due to fatigue, generalized weakness and stating she feels sick.  Patient put back to bed with Max assist to reposition.  Patient will benefit from continued physical therapy in hospital and recommended venue below to increase strength, balance, endurance for safe ADLs and gait.     Follow Up Recommendations SNF;Supervision for mobility/OOB;Supervision/Assistance - 24 hour    Equipment Recommendations  None recommended by PT    Recommendations for Other Services       Precautions / Restrictions Precautions Precautions: Fall Restrictions Weight Bearing Restrictions: No      Mobility  Bed Mobility Overal bed mobility: Needs Assistance Bed Mobility: Supine to Sit;Sit to Supine     Supine to sit: Mod assist;Max assist Sit to supine: Mod  assist   General bed mobility comments: slow labored movement    Transfers Overall transfer level: Needs assistance Equipment used: Rolling walker (2 wheeled);1 person hand held assist Transfers: Sit to/from Stand Sit to Stand: Mod assist         General transfer comment: unsteady slow labored movement  Ambulation/Gait Ambulation/Gait assistance: Mod assist;Max assist Gait Distance (Feet): 4 Feet Assistive device: Rolling walker (2 wheeled) Gait Pattern/deviations: Decreased step length - right;Decreased step length - left;Decreased stride length;Shuffle Gait velocity: slow   General Gait Details: limited to 4-5 slow labored unsteady side steps due to poor standing balance, c/o increased pain in BLE  Stairs            Wheelchair Mobility    Modified Rankin (Stroke Patients Only)       Balance Overall balance assessment: Needs assistance Sitting-balance support: Feet supported;Bilateral upper extremity supported Sitting balance-Leahy Scale: Fair Sitting balance - Comments: seated at EOB   Standing balance support: During functional activity;Bilateral upper extremity supported Standing balance-Leahy Scale: Poor Standing balance comment: using RW                             Pertinent Vitals/Pain Pain Assessment: Faces Faces Pain Scale: Hurts little more Pain Location: BLE with pressure Pain Descriptors / Indicators: Sore;Grimacing Pain Intervention(s): Limited activity within patient's tolerance;Monitored during session;Repositioned    Home Living Family/patient expects to be discharged to:: Private residence Living Arrangements: Other relatives Available Help at Discharge: Family;Available PRN/intermittently Type  of Home: House Home Access: Ramped entrance     Home Layout: Two level;Able to live on main level with bedroom/bathroom Home Equipment: Kasandra Knudsen - single point;Walker - 4 wheels;Shower seat - built in      Prior Function Level of  Independence: Needs assistance   Gait / Transfers Assistance Needed: household ambulator with RW  ADL's / Homemaking Assistance Needed: assisted by family        Hand Dominance   Dominant Hand: Right    Extremity/Trunk Assessment   Upper Extremity Assessment Upper Extremity Assessment: Generalized weakness    Lower Extremity Assessment Lower Extremity Assessment: Generalized weakness    Cervical / Trunk Assessment Cervical / Trunk Assessment: Kyphotic  Communication   Communication: No difficulties  Cognition Arousal/Alertness: Awake/alert Behavior During Therapy: WFL for tasks assessed/performed Overall Cognitive Status: Within Functional Limits for tasks assessed                                        General Comments      Exercises     Assessment/Plan    PT Assessment Patient needs continued PT services  PT Problem List Decreased strength;Decreased activity tolerance;Decreased balance;Decreased mobility       PT Treatment Interventions DME instruction;Gait training;Stair training;Functional mobility training;Therapeutic activities;Therapeutic exercise;Balance training;Patient/family education    PT Goals (Current goals can be found in the Care Plan section)  Acute Rehab PT Goals Patient Stated Goal: return home PT Goal Formulation: With patient Time For Goal Achievement: 04/13/20 Potential to Achieve Goals: Good    Frequency Min 3X/week   Barriers to discharge        Co-evaluation               AM-PAC PT "6 Clicks" Mobility  Outcome Measure Help needed turning from your back to your side while in a flat bed without using bedrails?: A Lot Help needed moving from lying on your back to sitting on the side of a flat bed without using bedrails?: A Lot Help needed moving to and from a bed to a chair (including a wheelchair)?: A Lot Help needed standing up from a chair using your arms (e.g., wheelchair or bedside chair)?: A  Lot Help needed to walk in hospital room?: A Lot Help needed climbing 3-5 steps with a railing? : Total 6 Click Score: 11    End of Session   Activity Tolerance: Patient tolerated treatment well;Patient limited by fatigue;Patient limited by pain Patient left: in bed;with call bell/phone within reach Nurse Communication: Mobility status PT Visit Diagnosis: Unsteadiness on feet (R26.81);Other abnormalities of gait and mobility (R26.89);Muscle weakness (generalized) (M62.81);History of falling (Z91.81)    Time: 0928-1000 PT Time Calculation (min) (ACUTE ONLY): 32 min   Charges:   PT Evaluation $PT Eval Moderate Complexity: 1 Mod PT Treatments $Therapeutic Activity: 23-37 mins        11:47 AM, 03/30/20 Lonell Grandchild, MPT Physical Therapist with [facility] [phone] office [phone] mobile phone

## 2020-03-31 ENCOUNTER — Inpatient Hospital Stay (HOSPITAL_COMMUNITY): Payer: Medicare HMO

## 2020-03-31 DIAGNOSIS — K5909 Other constipation: Secondary | ICD-10-CM

## 2020-03-31 DIAGNOSIS — R7881 Bacteremia: Secondary | ICD-10-CM

## 2020-03-31 DIAGNOSIS — I5032 Chronic diastolic (congestive) heart failure: Secondary | ICD-10-CM

## 2020-03-31 DIAGNOSIS — I482 Chronic atrial fibrillation, unspecified: Secondary | ICD-10-CM

## 2020-03-31 DIAGNOSIS — B952 Enterococcus as the cause of diseases classified elsewhere: Secondary | ICD-10-CM

## 2020-03-31 DIAGNOSIS — R2681 Unsteadiness on feet: Secondary | ICD-10-CM | POA: Diagnosis present

## 2020-03-31 DIAGNOSIS — W19XXXA Unspecified fall, initial encounter: Secondary | ICD-10-CM

## 2020-03-31 DIAGNOSIS — I609 Nontraumatic subarachnoid hemorrhage, unspecified: Secondary | ICD-10-CM | POA: Diagnosis not present

## 2020-03-31 DIAGNOSIS — R509 Fever, unspecified: Secondary | ICD-10-CM | POA: Diagnosis present

## 2020-03-31 LAB — COMPREHENSIVE METABOLIC PANEL
ALT: 47 U/L — ABNORMAL HIGH (ref 0–44)
AST: 67 U/L — ABNORMAL HIGH (ref 15–41)
Albumin: 2.1 g/dL — ABNORMAL LOW (ref 3.5–5.0)
Alkaline Phosphatase: 131 U/L — ABNORMAL HIGH (ref 38–126)
Anion gap: 11 (ref 5–15)
BUN: 65 mg/dL — ABNORMAL HIGH (ref 8–23)
CO2: 28 mmol/L (ref 22–32)
Calcium: 9 mg/dL (ref 8.9–10.3)
Chloride: 94 mmol/L — ABNORMAL LOW (ref 98–111)
Creatinine, Ser: 3.09 mg/dL — ABNORMAL HIGH (ref 0.44–1.00)
GFR, Estimated: 14 mL/min — ABNORMAL LOW (ref >60)
Glucose, Bld: 154 mg/dL — ABNORMAL HIGH (ref 70–99)
Potassium: 3.9 mmol/L (ref 3.5–5.1)
Sodium: 133 mmol/L — ABNORMAL LOW (ref 135–145)
Total Bilirubin: 1 mg/dL (ref 0.3–1.2)
Total Protein: 5.6 g/dL — ABNORMAL LOW (ref 6.5–8.1)

## 2020-03-31 LAB — CBC
HCT: 27.4 % — ABNORMAL LOW (ref 36.0–46.0)
Hemoglobin: 7.9 g/dL — ABNORMAL LOW (ref 12.0–15.0)
MCH: 26.9 pg (ref 26.0–34.0)
MCHC: 28.8 g/dL — ABNORMAL LOW (ref 30.0–36.0)
MCV: 93.2 fL (ref 80.0–100.0)
Platelets: 221 10*3/uL (ref 150–400)
RBC: 2.94 MIL/uL — ABNORMAL LOW (ref 3.87–5.11)
RDW: 15.4 % (ref 11.5–15.5)
WBC: 21.7 10*3/uL — ABNORMAL HIGH (ref 4.0–10.5)
nRBC: 0 % (ref 0.0–0.2)

## 2020-03-31 LAB — ECHOCARDIOGRAM COMPLETE
AR max vel: 1.33 cm2
AV Area VTI: 1.36 cm2
AV Area mean vel: 1.61 cm2
AV Mean grad: 12 mmHg
AV Peak grad: 23 mmHg
Ao pk vel: 2.4 m/s
Area-P 1/2: 2.2 cm2
MV VTI: 0.66 cm2
S' Lateral: 2.7 cm
Weight: 2931.24 oz

## 2020-03-31 LAB — MAGNESIUM: Magnesium: 2.4 mg/dL (ref 1.7–2.4)

## 2020-03-31 LAB — GLUCOSE, CAPILLARY
Glucose-Capillary: 155 mg/dL — ABNORMAL HIGH (ref 70–99)
Glucose-Capillary: 163 mg/dL — ABNORMAL HIGH (ref 70–99)
Glucose-Capillary: 113 mg/dL — ABNORMAL HIGH (ref 70–99)
Glucose-Capillary: 135 mg/dL — ABNORMAL HIGH (ref 70–99)

## 2020-03-31 LAB — URINE CULTURE
Culture: NO GROWTH
Special Requests: NORMAL

## 2020-03-31 LAB — HEMOGLOBIN A1C
Hgb A1c MFr Bld: 6.9 % — ABNORMAL HIGH (ref 4.8–5.6)
Mean Plasma Glucose: 151.33 mg/dL

## 2020-03-31 MED ORDER — IPRATROPIUM-ALBUTEROL 0.5-2.5 (3) MG/3ML IN SOLN
3.0000 mL | RESPIRATORY_TRACT | Status: DC | PRN
  Filled 2020-03-31 (×2): qty 3

## 2020-03-31 MED ORDER — SODIUM CHLORIDE 0.9 % IV SOLN
2.0000 g | Freq: Three times a day (TID) | INTRAVENOUS | Status: DC
  Administered 2020-03-31 – 2020-04-07 (×20): 2 g via INTRAVENOUS
  Filled 2020-03-31 (×30): qty 2000

## 2020-03-31 NOTE — Care Management Important Message (Signed)
Important Message  Patient Details  Name: Amy Wiggins MRN: [id number] Date of Birth: 07/17/1931   Medicare Important Message Given:  Yes     [name] 03/31/2020, 2:01 PM

## 2020-03-31 NOTE — Progress Notes (Signed)
*  PRELIMINARY RESULTS* Echocardiogram 2D Echocardiogram has been performed.  Samuel Germany 03/31/2020, 5:10 PM

## 2020-03-31 NOTE — Progress Notes (Signed)
87 yo F adm on 2-3 with fevers and fall. Now found to have enterococcal bacteremia 4/4. Would consider stopping cefepime and vanco, and changing to IV ampicillin if Cx is not VRE.   I would consider TTE at a minimum. A TEE could be indicated but will defer to primary and considerations around her age and wishes.      [place] [facility] bacteremia   Gram positive bacteremia  is associated with a high rate of complications and mortality.  Specific aspects of clinical management are critical to optimizing the outcome of patients with SAB.  Therefore, the [facility]imicrobial Management Team [facility]) has initiated an intervention aimed at improving the management of SAB at [facility].  To do so, Infectious Diseases physicians are providing an evidence-based consult for the management of all patients with SAB.     Yes No Comments  Perform follow-up blood cultures (even if the patient is afebrile) to ensure clearance of bacteremia [x]  []  03-31-20  Remove vascular catheter and obtain follow-up blood cultures after the removal of the catheter []  []    Perform echocardiography to evaluate for endocarditis (transthoracic ECHO is 40-50% sensitive, TEE is > 90% sensitive) [x]  []  Please keep in mind, that neither test can definitively EXCLUDE endocarditis, and that should clinical suspicion remain high for endocarditis the patient should then still be treated with an "endocarditis" duration of therapy = 6 weeks  Consult electrophysiologist to evaluate implanted cardiac device (pacemaker, ICD) []  []    Ensure source control []  []  Have all abscesses been drained effectively? Have deep seeded infections (septic joints or osteomyelitis) had appropriate surgical debridement?  Investigate for "metastatic" sites of infection []  []  Does the patient have ANY symptom or physical exam finding that would suggest a deeper infection (back or neck pain that may be suggestive of vertebral osteomyelitis or  epidural abscess, muscle pain that could be a symptom of pyomyositis)?  Keep in mind that for deep seeded infections MRI imaging with contrast is preferred rather than other often insensitive tests such as plain x-rays, especially early in a patient's presentation.  Change antibiotic therapy to __________________ [x]  []  Beta-lactam antibiotics are preferred for MSSA due to higher cure rates.   If on Vancomycin, goal trough should be 15 - 20 mcg/mL  Estimated duration of IV antibiotic therapy:   []  []  Consult case management for probably prolonged outpatient IV antibiotic therapy

## 2020-03-31 NOTE — Progress Notes (Signed)
PROGRESS NOTE   Amy Wiggins  KYH:[id number] DOB: Oct 01, 1931 DOA: 03/29/2020 PCP: Asencion Noble, MD   Chief Complaint  Patient presents with  . Fall   Level of care: Med-Surg  Brief Admission History:  87 y.o. female, with history of stroke, patent foreman ovale, paroxysmal atrial fibrillation, hypertension, hyperlipidemia, diabetes mellitus, depression, and more presents to the ED with a chief complaint of fall.  Patient reports that she was in the bathroom and he just stood up from the toilet when she fell.  She hit her head on the floor.  She did not lose consciousness.  She reports that before her fall she did not have any chest pain, palpitation, shortness of breath.  She reports that at this time she is having some blurry vision in her left eye.  Patient had her knees and hurt her left hip during the fall.  CT C-spine and head showed small amount of acute subarachnoid hemorrhage in setting of trauma, left frontal scalp and supra orbital swelling and soft tissue hematoma.  No acute fracture of C-spine, C-spine spondylitic changes chronic.  CT lumbar shows no acute fracture or static subluxation of lumbar spine.  Chest x-ray shows retrocardiac opacity that could be atelectasis versus infectious.  X-ray hip shows no acute findings.  Neurosurgery consulted from ED and reports patient can stay here for repeat CT which revealed stable bleed.    Assessment & Plan:   Principal Problem:   Subarachnoid hemorrhage (HCC) Active Problems:   Bacteremia due to Enterococcus   CKD (chronic kidney disease) stage 4, GFR 15-29 ml/min (HCC)   Type 2 diabetes mellitus with circulatory disorder (HCC)   HFpEF/Chronic diastolic congestive heart failure (HCC)   Atrial fibrillation, chronic (HCC)   Hypertension associated with stage 4 chronic kidney disease due to type 2 diabetes mellitus (HCC)   Hyperlipidemia associated with type 2 diabetes mellitus (HCC)   Gastroesophageal reflux disease   Chronic  constipation   Intracranial hemorrhage (HCC)   Gait instability   Fall   Fever  1. Sepsis secondary to enterococcal bacteremia - appreciate ID assistance, VRE ruled out.  Continue IV ampicillin.  Continue supportive measures.  TTE pending.   2. Fall at home - Pt remains very unstable to ambulate, declining SNF placement.  I would not restart anticoagulation at this time due to extremely high fall risk.  3. Subarachnoid hemorrhage - small hemorrhage noted.  Repeated CT head after 12 hours with no change seen.  Neurosurgery follow up outpatient.   4. Chronic atrial fibrillation - rate controlled now, no anticoagulation due to intracranial hemorrhage.  5. Stage 4 CKD - stable, following and adjusting medications as needed for CKD.   6. Hyperlipidemia - stable. Resume home meds.   7. Type 2 DM with renal complications - continue to monitor CBGs and provide SSI as needed.  8. DNR - present on admission.  With high 1 year mortality risk will ask for palliative consultation for goals of care.    DVT prophylaxis:  SCDs Code Status:  DNR Family Communication:  Disposition: declines SNF, return home  Status is: Inpatient  Remains inpatient appropriate because:Inpatient level of care appropriate due to severity of illness   Dispo: The patient is from: Home              Anticipated d/c is to: Home              Anticipated d/c date is: 2 days  Patient currently is not medically stable to d/c.   Difficult to place patient No  Consultants:   PT   Procedures:   CT head x 2   Antimicrobials:     Subjective: Pt without specific complaints  Objective: Vitals:   03/31/20 0546 03/31/20 0721 03/31/20 1318 03/31/20 1336  BP: (!) 118/47   (!) 106/51  Pulse: 87   72  Resp: 18   16  Temp: 98.6 F (37 C)   (!) 97.5 F (36.4 C)  TempSrc:    Oral  SpO2: 97% 98% 97% 100%  Weight:        Intake/Output Summary (Last 24 hours) at 03/31/2020 1624 Last data filed at 03/31/2020  3419 Gross per 24 hour  Intake 281.64 ml  Output 350 ml  Net -68.36 ml   Filed Weights   03/29/20 2144 03/30/20 1825  Weight: 86 kg 83.1 kg    Examination:  General exam: Appears calm and comfortable  Respiratory system: Clear to auscultation. Respiratory effort normal. Cardiovascular system: normal S1 & S2 heard. No JVD, murmurs, rubs, gallops or clicks. No pedal edema. Gastrointestinal system: Abdomen is nondistended, soft and nontender. No organomegaly or masses felt. Normal bowel sounds heard. Central nervous system: Alert and oriented. No focal neurological deficits. Extremities: Symmetric 5 x 5 power. Skin: No rashes, lesions or ulcers Psychiatry: Judgement and insight appear normal. Mood & affect appropriate.   Data Reviewed: I have personally reviewed following labs and imaging studies  CBC: Recent Labs  Lab 03/30/20 0053 03/31/20 0524  WBC 18.9* 21.7*  NEUTROABS 17.0*  --   HGB 10.0* 7.9*  HCT 33.5* 27.4*  MCV 90.8 93.2  PLT 220 379    Basic Metabolic Panel: Recent Labs  Lab 03/30/20 0053 03/31/20 0524  NA 127* 133*  K 3.9 3.9  CL 89* 94*  CO2 28 28  GLUCOSE 222* 154*  BUN 51* 65*  CREATININE 2.65* 3.09*  CALCIUM 8.9 9.0  MG  --  2.4    GFR: Estimated Creatinine Clearance: 13.6 mL/min (A) (by C-G formula based on SCr of 3.09 mg/dL (H)).  Liver Function Tests: Recent Labs  Lab 03/30/20 0053 03/31/20 0524  AST 57* 67*  ALT 39 47*  ALKPHOS 175* 131*  BILITOT 0.8 1.0  PROT 6.7 5.6*  ALBUMIN 2.5* 2.1*    CBG: Recent Labs  Lab 03/30/20 1737 03/30/20 2146 03/31/20 0714 03/31/20 1124 03/31/20 1617  GLUCAP 107* 148* 155* 163* 113*    Recent Results (from the past 240 hour(s))  SARS Coronavirus 2 by RT PCR (hospital order, performed in [facility] hospital lab) Nasopharyngeal Nasopharyngeal Swab     Status: None   Collection Time: 03/30/20 12:45 AM   Specimen: Nasopharyngeal Swab  Result Value Ref Range Status   SARS Coronavirus 2  NEGATIVE NEGATIVE Final    Comment: (NOTE) SARS-CoV-2 target nucleic acids are NOT DETECTED.  The SARS-CoV-2 RNA is generally detectable in upper and lower respiratory specimens during the acute phase of infection. The lowest concentration of SARS-CoV-2 viral copies this assay can detect is 250 copies / mL. A negative result does not preclude SARS-CoV-2 infection and should not be used as the sole basis for treatment or other patient management decisions.  A negative result may occur with improper specimen collection / handling, submission of specimen other than nasopharyngeal swab, presence of viral mutation(s) within the areas targeted by this assay, and inadequate number of viral copies (<250 copies / mL). A negative result must be  combined with clinical observations, patient history, and epidemiological information.  Fact Sheet for Patients:   StrictlyIdeas.no  Fact Sheet for Healthcare Providers: BankingDealers.co.za  This test is not yet approved or  cleared by the [place] FDA and has been authorized for detection and/or diagnosis of SARS-CoV-2 by FDA under an Emergency Use Authorization (EUA).  This EUA will remain in effect (meaning this test can be used) for the duration of the COVID-19 declaration under Section 564(b)(1) of the Act, 21 U.S.C. section 360bbb-3(b)(1), unless the authorization is terminated or revoked sooner.  Performed at [facility], [address] [place], [place] [address]   Culture, blood (routine x 2)     Status: Abnormal (Preliminary result)   Collection Time: 03/30/20 12:45 AM   Specimen: BLOOD  Result Value Ref Range Status   Specimen Description   Final    BLOOD Performed at [facility], [address] [place], [place] [address]    Special Requests   Final    Normal Performed at [facility], [address] [place], [place] [address]    Culture  Setup Time   Final    GRAM POSITIVE  COCCI IN BOTH AEROBIC AND ANAEROBIC BOTTLES Gram Stain Report Called to,Read Back By and Verified With: WHITE,M RN @1510  03/30/20 BY JONES,T Performed at [place] ID to follow CRITICAL RESULT CALLED TO, READ BACK BY AND VERIFIED WITH: A LEONARD RN 2134 03/30/20 A BROWNING    Culture (A)  Final    ENTEROCOCCUS FAECALIS SUSCEPTIBILITIES TO FOLLOW Performed at [facility], [place] [address], [place],  [address]    Report Status PENDING  Incomplete  Blood Culture ID Panel (Reflexed)     Status: Abnormal   Collection Time: 03/30/20 12:45 AM  Result Value Ref Range Status   Enterococcus faecalis DETECTED (A) NOT DETECTED Final    Comment: CRITICAL RESULT CALLED TO, READ BACK BY AND VERIFIED WITH: A LEONARD RN 2134 03/30/20 A BROWNING    Enterococcus Faecium NOT DETECTED NOT DETECTED Final   Listeria monocytogenes NOT DETECTED NOT DETECTED Final   Staphylococcus species NOT DETECTED NOT DETECTED Final   Staphylococcus aureus (BCID) NOT DETECTED NOT DETECTED Final   Staphylococcus epidermidis NOT DETECTED NOT DETECTED Final   Staphylococcus lugdunensis NOT DETECTED NOT DETECTED Final   Streptococcus species NOT DETECTED NOT DETECTED Final   Streptococcus agalactiae NOT DETECTED NOT DETECTED Final   Streptococcus pneumoniae NOT DETECTED NOT DETECTED Final   Streptococcus pyogenes NOT DETECTED NOT DETECTED Final   A.calcoaceticus-baumannii NOT DETECTED NOT DETECTED Final   Bacteroides fragilis NOT DETECTED NOT DETECTED Final   Enterobacterales NOT DETECTED NOT DETECTED Final   Enterobacter cloacae complex NOT DETECTED NOT DETECTED Final   Escherichia coli NOT DETECTED NOT DETECTED Final   Klebsiella aerogenes NOT DETECTED NOT DETECTED Final   Klebsiella oxytoca NOT DETECTED NOT DETECTED Final   Klebsiella pneumoniae NOT DETECTED NOT DETECTED Final   Proteus species NOT DETECTED NOT DETECTED Final   Salmonella species NOT DETECTED NOT DETECTED Final   Serratia  marcescens NOT DETECTED NOT DETECTED Final   Haemophilus influenzae NOT DETECTED NOT DETECTED Final   Neisseria meningitidis NOT DETECTED NOT DETECTED Final   Pseudomonas aeruginosa NOT DETECTED NOT DETECTED Final   Stenotrophomonas maltophilia NOT DETECTED NOT DETECTED Final   Candida albicans NOT DETECTED NOT DETECTED Final   Candida auris NOT DETECTED NOT DETECTED Final   Candida glabrata NOT DETECTED NOT DETECTED Final   Candida krusei NOT DETECTED  NOT DETECTED Final   Candida parapsilosis NOT DETECTED NOT DETECTED Final   Candida tropicalis NOT DETECTED NOT DETECTED Final   Cryptococcus neoformans/gattii NOT DETECTED NOT DETECTED Final   Vancomycin resistance NOT DETECTED NOT DETECTED Final    Comment: Performed at [facility], [place] [address] [place], [place] [address]  Culture, blood (routine x 2)     Status: Abnormal (Preliminary result)   Collection Time: 03/30/20  1:02 AM   Specimen: BLOOD  Result Value Ref Range Status   Specimen Description   Final    BLOOD LEFT ANTECUBITAL Performed at [facility], [address] [place], [place] [address]    Special Requests   Final    BOTTLES DRAWN AEROBIC AND ANAEROBIC Blood Culture adequate volume Performed at [facility], [address], [place], [place] [address]    Culture  Setup Time   Final    GRAM POSITIVE COCCI IN BOTH AEROBIC AND ANAEROBIC BOTTLES Gram Stain Report Called to,Read Back By and Verified With: WHITE,M RN @1510  03/30/20 BY JONES,T Performed at [place] TO, READ BACK BY AND VERIFIED WITH: Gavin Pound RN [id number] 03/30/20 A BROWNING Performed at [facility], [place] [address] [place], [place] [address]    Culture ENTEROCOCCUS FAECALIS (A)  Final   Report Status PENDING  Incomplete  Urine culture     Status: None   Collection Time: 03/30/20  4:50 AM   Specimen: Urine, Catheterized  Result Value Ref Range Status   Specimen Description   Final     URINE, CATHETERIZED Performed at [facility], [address] [place], [place] [address]    Special Requests   Final    Normal Performed at [facility], [address], [place], [place] [address]    Culture   Final    NO GROWTH Performed at [facility], [place] [address] [place], [place] [address]    Report Status 03/31/2020 FINAL  Final  Culture, blood (Routine X 2) w Reflex to ID Panel     Status: None (Preliminary result)   Collection Time: 03/31/20  8:52 AM   Specimen: BLOOD  Result Value Ref Range Status   Specimen Description BLOOD LEFT ANTECUBITAL  Final   Special Requests   Final    BOTTLES DRAWN AEROBIC AND ANAEROBIC Blood Culture adequate volume   Culture   Final    NO GROWTH < 12 HOURS Performed at [facility], [address], [place], [place] [address]    Report Status PENDING  Incomplete     Radiology Studies: DG Chest 1 View  Result Date: 03/30/2020 CLINICAL DATA:  Fall with fever EXAM: CHEST  1 VIEW COMPARISON:  None. FINDINGS: The heart size and mediastinal contours are mildly enlarged . Aortic knob calcifications are seen. Patchy retrocardiac airspace opacity is noted with air bronchograms. The visualized skeletal structures are unremarkable. IMPRESSION: Retrocardiac opacity which could be atelectasis and/or infectious etiology Electronically Signed   By: Prudencio Pair M.D.   On: 03/30/2020 01:52   CT HEAD WO CONTRAST  Result Date: 03/30/2020 CLINICAL DATA:  Follow-up subarachnoid hemorrhage. EXAM: CT HEAD WITHOUT CONTRAST TECHNIQUE: Contiguous axial images were obtained from the base of the skull through the vertex without intravenous contrast. COMPARISON:  Same day head CT at 0140 hours. FINDINGS: Brain: Similar solid amount of subarachnoid hemorrhage seen along the surface of the precentral gyrus (2/26). No new areas of intra or extra axial hemorrhage visualized.  Again seen is the remote cortically based infarct in the right precentral gyrus. Encephalomalacia  again seen in the bilateral occipital lobes and cerebellar hemispheres. Patchy areas of white matter hypoattenuation most compatible with chronic microvascular angiopathy. No evidence of acute infarction, hydrocephalus mass lesion or mass effect. Similar mild degree symmetric ex vacuo dilatation of the ventricular system with commensurate parenchymal volume loss. Vascular: No hyperdense vessel or unexpected calcification. Atherosclerotic calcifications of the carotid siphons and vertebral arteries. Skull: No significant change in the left frontal scalp and supraorbital soft tissue swelling with extracalvarial hematoma. Hyperostosis frontalis interna. Sinuses/Orbits: Mild scattered mucosal thickening of the paranasal sinuses. Prior bilateral lens surgery. Similar asymmetric left periorbital and palpebral swelling without retro septal involvement. Other: None IMPRESSION: 1. Similar solid amount of subarachnoid hemorrhage along the surface of the left precentral gyrus. No new areas of intra or extra axial hemorrhage visualized. 2. No significant change in the left frontal scalp and supraorbital soft tissue swelling with extracalvarial hematoma. 3. Similar asymmetric left periorbital and palpebral swelling without retro septal involvement. Electronically Signed   By: Dahlia Bailiff MD   On: 03/30/2020 16:50   CT Head Wo Contrast  Result Date: 03/30/2020 CLINICAL DATA:  Fall, fever EXAM: CT HEAD WITHOUT CONTRAST CT CERVICAL SPINE WITHOUT CONTRAST TECHNIQUE: Multidetector CT imaging of the head and cervical spine was performed following the standard protocol without intravenous contrast. Multiplanar CT image reconstructions of the cervical spine were also generated. COMPARISON:  MR 02/22/2016, CT 02/21/2016, MR neck 06/02/2015 FINDINGS: CT HEAD FINDINGS Brain: Curvilinear hyperattenuation is seen along the surface of the precentral gyrus (2/24) concerning for a small amount of acute subarachnoid hemorrhage. No other  sites of extra-axial or parenchymal hemorrhage. 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 Additional regions of encephalomalacia is seen in the bilateral occipital lobes and cerebellar hemispheres. No evidence of acute infarction, hydrocephalus, visible mass lesion or mass effect. Symmetric prominence of the ventricles, cisterns and sulci compatible with parenchymal volume loss. Patchy areas of white matter hypoattenuation are most compatible with chronic microvascular angiopathy. Vascular: Atherosclerotic calcification of the carotid siphons and intradural vertebral arteries. No hyperdense vessel. Skull: Left frontal scalp and supraorbital soft tissue swelling with crescentic scalp hematoma measuring up to 6 mm in maximal thickness. No other significant sites of scalp swelling. No subjacent calvarial fracture or visible facial bone fracture is evident within the included margins of imaging. Hyperostosis frontalis interna is a benign incidental finding. Sinuses/Orbits: Mild nodular mural thickening in the paranasal sinuses. No layering air-fluid levels or pneumatized secretions. Prior bilateral lens extractions. Asymmetric left periorbital and palpebral swelling without retro septal gas, stranding or hemorrhage. Remaining orbital contents are unremarkable. Other: None CT CERVICAL SPINE FINDINGS Alignment: Stabilization collar is absent at the time of exam. Mild straightening of normal cervical lordosis. Light stepwise anterolisthesis C2-C5 and minimal 2 mm of anterolisthesis C6 on C7 favored to be on a spondylitic basis given significant facet arthropathy and uncinate changes. No evidence of traumatic listhesis. No abnormally widened, perched or jumped facets. Normal alignment of the craniocervical and atlantoaxial articulations. Skull base and vertebrae: No acute skull base fracture. No vertebral body fracture or height loss. The osseous structures appear diffusely demineralized which may  limit detection of small or nondisplaced fractures. No worrisome osseous lesions. Moderate arthrosis at the atlantodental and basion dens intervals with few small erosions and calcific pannus formation, nonspecific but can be seen with CPPD or rheumatoid arthropathy. Multilevel cervical spondylitic changes as detailed below.  Enthesopathic changes noted along the spinous processes and nuchal ligament. Soft tissues and spinal canal: No pre or paravertebral fluid or swelling. No visible canal hematoma. Cervical carotid atherosclerosis. Airways patent. Disc levels: Multilevel intervertebral disc height loss with spondylitic endplate changes. Larger disc osteophyte complexes are present C3-C5 with some partial effacement of the ventral thecal sac. Additional disc osteophyte complex C6-5-C6 may result in some mild canal stenosis in combination with posterior ligamentum flavum infolding. Multilevel uncinate spurring and facet hypertrophic changes are present throughout the cervical spine as well resulting in mild-to-moderate multilevel neural foraminal narrowing most pronounced at the C3-4 level bilaterally. Upper chest: Atelectatic changes in the lung apices. Calcification of proximal great vessels noted incidentally. Other: No concerning thyroid nodules or masses. IMPRESSION: 1. Curvilinear hyperattenuation along the surface of the left precentral gyrus concerning for a small amount of acute subarachnoid hemorrhage in the setting of trauma. No other acute intracranial abnormalities. 2. Left frontal scalp and supraorbital swelling and soft tissue hematoma. No subjacent calvarial or visible facial bone fracture. No orbital injury is identified. 3. No acute fracture or traumatic listhesis of the cervical spine. 4. 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 Additional regions of encephalomalacia in the bilateral occipital lobes and cerebellar hemispheres. 5. Multilevel cervical spondylitic changes, as  above. 6. Moderate arthrosis at the atlantodental and basion dens intervals with erosions and calcific pannus formation, nonspecific but can be seen with CPPD or rheumatoid arthropathy. 7. Intracranial and cervical atherosclerosis. Critical Value/emergent results were called by telephone at the time of interpretation on 03/30/2020 at 2:24 am to provider Veryl Speak , who verbally acknowledged these results. Electronically Signed   By: Lovena Le M.D.   On: 03/30/2020 02:23   CT Cervical Spine Wo Contrast  Result Date: 03/30/2020 CLINICAL DATA:  Fall, fever EXAM: CT HEAD WITHOUT CONTRAST CT CERVICAL SPINE WITHOUT CONTRAST TECHNIQUE: Multidetector CT imaging of the head and cervical spine was performed following the standard protocol without intravenous contrast. Multiplanar CT image reconstructions of the cervical spine were also generated. COMPARISON:  MR 02/22/2016, CT 02/21/2016, MR neck 06/02/2015 FINDINGS: CT HEAD FINDINGS Brain: Curvilinear hyperattenuation is seen along the surface of the precentral gyrus (2/24) concerning for a small amount of acute subarachnoid hemorrhage. No other sites of extra-axial or parenchymal hemorrhage. 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 Additional regions of encephalomalacia is seen in the bilateral occipital lobes and cerebellar hemispheres. No evidence of acute infarction, hydrocephalus, visible mass lesion or mass effect. Symmetric prominence of the ventricles, cisterns and sulci compatible with parenchymal volume loss. Patchy areas of white matter hypoattenuation are most compatible with chronic microvascular angiopathy. Vascular: Atherosclerotic calcification of the carotid siphons and intradural vertebral arteries. No hyperdense vessel. Skull: Left frontal scalp and supraorbital soft tissue swelling with crescentic scalp hematoma measuring up to 6 mm in maximal thickness. No other significant sites of scalp swelling. No subjacent calvarial  fracture or visible facial bone fracture is evident within the included margins of imaging. Hyperostosis frontalis interna is a benign incidental finding. Sinuses/Orbits: Mild nodular mural thickening in the paranasal sinuses. No layering air-fluid levels or pneumatized secretions. Prior bilateral lens extractions. Asymmetric left periorbital and palpebral swelling without retro septal gas, stranding or hemorrhage. Remaining orbital contents are unremarkable. Other: None CT CERVICAL SPINE FINDINGS Alignment: Stabilization collar is absent at the time of exam. Mild straightening of normal cervical lordosis. Light stepwise anterolisthesis C2-C5 and minimal 2 mm of anterolisthesis C6 on C7 favored to  be on a spondylitic basis given significant facet arthropathy and uncinate changes. No evidence of traumatic listhesis. No abnormally widened, perched or jumped facets. Normal alignment of the craniocervical and atlantoaxial articulations. Skull base and vertebrae: No acute skull base fracture. No vertebral body fracture or height loss. The osseous structures appear diffusely demineralized which may limit detection of small or nondisplaced fractures. No worrisome osseous lesions. Moderate arthrosis at the atlantodental and basion dens intervals with few small erosions and calcific pannus formation, nonspecific but can be seen with CPPD or rheumatoid arthropathy. Multilevel cervical spondylitic changes as detailed below. Enthesopathic changes noted along the spinous processes and nuchal ligament. Soft tissues and spinal canal: No pre or paravertebral fluid or swelling. No visible canal hematoma. Cervical carotid atherosclerosis. Airways patent. Disc levels: Multilevel intervertebral disc height loss with spondylitic endplate changes. Larger disc osteophyte complexes are present C3-C5 with some partial effacement of the ventral thecal sac. Additional disc osteophyte complex C6-5-C6 may result in some mild canal stenosis in  combination with posterior ligamentum flavum infolding. Multilevel uncinate spurring and facet hypertrophic changes are present throughout the cervical spine as well resulting in mild-to-moderate multilevel neural foraminal narrowing most pronounced at the C3-4 level bilaterally. Upper chest: Atelectatic changes in the lung apices. Calcification of proximal great vessels noted incidentally. Other: No concerning thyroid nodules or masses. IMPRESSION: 1. Curvilinear hyperattenuation along the surface of the left precentral gyrus concerning for a small amount of acute subarachnoid hemorrhage in the setting of trauma. No other acute intracranial abnormalities. 2. Left frontal scalp and supraorbital swelling and soft tissue hematoma. No subjacent calvarial or visible facial bone fracture. No orbital injury is identified. 3. No acute fracture or traumatic listhesis of the cervical spine. 4. 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 Additional regions of encephalomalacia in the bilateral occipital lobes and cerebellar hemispheres. 5. Multilevel cervical spondylitic changes, as above. 6. Moderate arthrosis at the atlantodental and basion dens intervals with erosions and calcific pannus formation, nonspecific but can be seen with CPPD or rheumatoid arthropathy. 7. Intracranial and cervical atherosclerosis. Critical Value/emergent results were called by telephone at the time of interpretation on 03/30/2020 at 2:24 am to provider Veryl Speak , who verbally acknowledged these results. Electronically Signed   By: Lovena Le M.D.   On: 03/30/2020 02:23   CT Lumbar Spine Wo Contrast  Result Date: 03/30/2020 CLINICAL DATA:  Trauma and low back pain.  Fall.  Fever. EXAM: CT LUMBAR SPINE WITHOUT CONTRAST TECHNIQUE: Multidetector CT imaging of the lumbar spine was performed without intravenous contrast administration. Multiplanar CT image reconstructions were also generated. COMPARISON:  None. FINDINGS:  Segmentation: Standard Alignment: Normal Vertebrae: No acute fracture or focal pathologic process. Paraspinal and other soft tissues: Calcific aortic atherosclerosis. Cholelithiasis. Disc levels: Multilevel degenerative disc disease without high-grade spinal canal or neural foraminal stenosis. Degenerative changes are greatest at L4-5 and L5-S1. IMPRESSION: 1. No acute fracture or static subluxation of the lumbar spine. 2. Aortic Atherosclerosis (ICD10-I70.0). Electronically Signed   By: Ulyses Jarred M.D.   On: 03/30/2020 02:13   DG Hip Unilat W or Wo Pelvis 2-3 Views Left  Result Date: 03/30/2020 CLINICAL DATA:  Fall EXAM: DG HIP (WITH OR WITHOUT PELVIS) 2-3V LEFT COMPARISON:  None. FINDINGS: There is no evidence of hip fracture or dislocation. Moderate left hip osteoarthritis is seen with superior joint space loss and marginal osteophyte formation. There is diffuse osteopenia. There is no evidence of arthropathy or other focal bone abnormality. Scattered vascular calcifications  are noted. IMPRESSION: No acute osseous abnormality Electronically Signed   By: Prudencio Pair M.D.   On: 03/30/2020 01:50   Scheduled Meds: . amiodarone  200 mg Oral Daily  . atorvastatin  20 mg Oral Daily  . Chlorhexidine Gluconate Cloth  6 each Topical Daily  . citalopram  20 mg Oral Daily  . diltiazem  120 mg Oral Daily  . insulin aspart  0-15 Units Subcutaneous TID WC  . insulin aspart  0-5 Units Subcutaneous QHS  . insulin detemir  25 Units Subcutaneous QHS  . ipratropium-albuterol  3 mL Nebulization TID  . mirtazapine  15 mg Oral QHS  . traZODone  75 mg Oral QHS   Continuous Infusions: . sodium chloride 1,000 mL (03/30/20 1606)  . [START ON 04/01/2020] vancomycin       LOS: 1 day   Time spent: 36 mins   Jehad Bisono Wynetta Emery, MD How to contact the [facility] Attending or Consulting provider [place] or covering provider during after hours [place], for this patient?  1. Check the care team in [facility] and look for a)  attending/consulting TRH provider listed and b) the [facility] team listed 2. Log into [email or web address] and use [place]'s universal password to access. If you do not have the password, please contact the hospital operator. 3. Locate the [facility] provider you are looking for under [facility] and page to a number that you can be directly reached. 4. If you still have difficulty reaching the provider, please page the [facility] (Director on Call) for the Hospitalists listed on amion for assistance.  03/31/2020, 4:24 PM

## 2020-04-01 DIAGNOSIS — I609 Nontraumatic subarachnoid hemorrhage, unspecified: Secondary | ICD-10-CM | POA: Diagnosis not present

## 2020-04-01 DIAGNOSIS — K5909 Other constipation: Secondary | ICD-10-CM | POA: Diagnosis not present

## 2020-04-01 DIAGNOSIS — R7881 Bacteremia: Secondary | ICD-10-CM | POA: Diagnosis not present

## 2020-04-01 DIAGNOSIS — I482 Chronic atrial fibrillation, unspecified: Secondary | ICD-10-CM | POA: Diagnosis not present

## 2020-04-01 LAB — BASIC METABOLIC PANEL
Anion gap: 10 (ref 5–15)
BUN: 69 mg/dL — ABNORMAL HIGH (ref 8–23)
CO2: 26 mmol/L (ref 22–32)
Calcium: 8.8 mg/dL — ABNORMAL LOW (ref 8.9–10.3)
Chloride: 99 mmol/L (ref 98–111)
Creatinine, Ser: 3.16 mg/dL — ABNORMAL HIGH (ref 0.44–1.00)
GFR, Estimated: 14 mL/min — ABNORMAL LOW (ref >60)
Glucose, Bld: 75 mg/dL (ref 70–99)
Potassium: 3.5 mmol/L (ref 3.5–5.1)
Sodium: 135 mmol/L (ref 135–145)

## 2020-04-01 LAB — CBC WITH DIFFERENTIAL/PLATELET
Abs Immature Granulocytes: 0.11 10*3/uL — ABNORMAL HIGH (ref 0.00–0.07)
Basophils Absolute: 0 10*3/uL (ref 0.0–0.1)
Basophils Relative: 0 %
Eosinophils Absolute: 0.5 10*3/uL (ref 0.0–0.5)
Eosinophils Relative: 3 %
HCT: 27.3 % — ABNORMAL LOW (ref 36.0–46.0)
Hemoglobin: 8 g/dL — ABNORMAL LOW (ref 12.0–15.0)
Immature Granulocytes: 1 %
Lymphocytes Relative: 4 %
Lymphs Abs: 0.7 10*3/uL (ref 0.7–4.0)
MCH: 27.2 pg (ref 26.0–34.0)
MCHC: 29.3 g/dL — ABNORMAL LOW (ref 30.0–36.0)
MCV: 92.9 fL (ref 80.0–100.0)
Monocytes Absolute: 1.8 10*3/uL — ABNORMAL HIGH (ref 0.1–1.0)
Monocytes Relative: 11 %
Neutro Abs: 13.1 10*3/uL — ABNORMAL HIGH (ref 1.7–7.7)
Neutrophils Relative %: 81 %
Platelets: 233 10*3/uL (ref 150–400)
RBC: 2.94 MIL/uL — ABNORMAL LOW (ref 3.87–5.11)
RDW: 15.5 % (ref 11.5–15.5)
WBC: 16.2 10*3/uL — ABNORMAL HIGH (ref 4.0–10.5)
nRBC: 0 % (ref 0.0–0.2)

## 2020-04-01 LAB — CULTURE, BLOOD (ROUTINE X 2)
Special Requests: ADEQUATE
Special Requests: NORMAL

## 2020-04-01 LAB — GLUCOSE, CAPILLARY
Glucose-Capillary: 89 mg/dL (ref 70–99)
Glucose-Capillary: 69 mg/dL — ABNORMAL LOW (ref 70–99)
Glucose-Capillary: 63 mg/dL — ABNORMAL LOW (ref 70–99)
Glucose-Capillary: 128 mg/dL — ABNORMAL HIGH (ref 70–99)

## 2020-04-01 MED ORDER — DEXTROSE 50 % IV SOLN
INTRAVENOUS | Status: AC
  Administered 2020-04-01: 50 mL
  Filled 2020-04-01: qty 50

## 2020-04-01 MED ORDER — INSULIN DETEMIR 100 UNIT/ML ~~LOC~~ SOLN
10.0000 [IU] | Freq: Every day | SUBCUTANEOUS | Status: DC
  Filled 2020-04-01: qty 0.1

## 2020-04-01 MED ORDER — INSULIN DETEMIR 100 UNIT/ML ~~LOC~~ SOLN
10.0000 [IU] | Freq: Every day | SUBCUTANEOUS | Status: DC
  Filled 2020-04-01 (×2): qty 0.1

## 2020-04-01 MED ORDER — DEXTROSE-NACL 5-0.9 % IV SOLN: INTRAVENOUS | Status: DC

## 2020-04-01 NOTE — Progress Notes (Signed)
Did not void after foley removal and scan showed 340.  Dr. Wynetta Emery ordered in and out yielded 350.

## 2020-04-01 NOTE — Progress Notes (Addendum)
PROGRESS NOTE   Amy Wiggins  AST:[id number] DOB: 1932-01-29 DOA: 03/29/2020 PCP: Asencion Noble, MD   Chief Complaint  Patient presents with  . Fall   Level of care: Med-Surg  Brief Admission History:  87 y.o. female, with history of stroke, patent foreman ovale, paroxysmal atrial fibrillation, hypertension, hyperlipidemia, diabetes mellitus, depression, and more presents to the ED with a chief complaint of fall.  Patient reports that she was in the bathroom and he just stood up from the toilet when she fell.  She hit her head on the floor.  She did not lose consciousness.  She reports that before her fall she did not have any chest pain, palpitation, shortness of breath.  She reports that at this time she is having some blurry vision in her left eye.  Patient had her knees and hurt her left hip during the fall.  CT C-spine and head showed small amount of acute subarachnoid hemorrhage in setting of trauma, left frontal scalp and supra orbital swelling and soft tissue hematoma.  No acute fracture of C-spine, C-spine spondylitic changes chronic.  CT lumbar shows no acute fracture or static subluxation of lumbar spine.  Chest x-ray shows retrocardiac opacity that could be atelectasis versus infectious.  X-ray hip shows no acute findings.  Neurosurgery consulted from ED and reports patient can stay here for repeat CT which revealed stable bleed.    Assessment & Plan:   Principal Problem:   Subarachnoid hemorrhage (HCC) Active Problems:   Bacteremia due to Enterococcus   CKD (chronic kidney disease) stage 4, GFR 15-29 ml/min (HCC)   Type 2 diabetes mellitus with circulatory disorder (HCC)   HFpEF/Chronic diastolic congestive heart failure (HCC)   Atrial fibrillation, chronic (HCC)   Hypertension associated with stage 4 chronic kidney disease due to type 2 diabetes mellitus (HCC)   Hyperlipidemia associated with type 2 diabetes mellitus (HCC)   Gastroesophageal reflux disease   Chronic  constipation   Intracranial hemorrhage (HCC)   Gait instability   Fall   Fever  1. Sepsis secondary to enterococcal bacteremia - appreciate ID assistance, VRE ruled out.  Continue IV ampicillin.  Continue supportive measures.  TTE with no vegetations seen.  Repeated BC no growth to date.  2. Fall at home - Pt remains very unstable to ambulate, likely will SNF but would benefit from residential hospice.  I would not restart anticoagulation at this time due to extremely high fall risk. 1 year mortality risk very high.  Requested palliative consult. Started discussions with granddaughter.  3. Subarachnoid hemorrhage - small hemorrhage noted.  Repeated CT head after 12 hours with no change seen.  Neurosurgery follow up outpatient.  Repeat CT in AM 2/6.   4. Chronic atrial fibrillation - rate controlled now, no anticoagulation due to intracranial hemorrhage.  5. Stage 4 CKD - stable, following and adjusting medications as needed for CKD.   6. Hyperlipidemia - stable. Resume home meds.   7. Type 2 DM with renal complications - reducing insulin and starting dextrose infusion 8. Hypoglycemia - dextrose infusion started.   9. DNR - present on admission.  With high 1 year mortality risk will ask for palliative consultation for goals of care.    DVT prophylaxis:  SCDs Code Status:  DNR Family Communication: grandaughter 04/01/20 Disposition: declines SNF, return home  Status is: Inpatient  Remains inpatient appropriate because:Inpatient level of care appropriate due to severity of illness  Dispo: The patient is from: Home  Anticipated d/c is to: TBD              Anticipated d/c date is: 2 days              Patient currently is not medically stable to d/c.   Difficult to place patient No  Consultants:   PT   Procedures:   CT head x 2   Antimicrobials:    Subjective: Pt more somnolent and not eating well today.   Objective: Vitals:   03/31/20 2031 04/01/20 0549 04/01/20 0740  04/01/20 1411  BP: (!) 131/58 127/62  (!) 124/53  Pulse: 81 73  79  Resp: 18 19    Temp: 98.5 F (36.9 C) 97.6 F (36.4 C)  (!) 97.5 F (36.4 C)  TempSrc:  Temporal  Oral  SpO2: 100% 94% 100% 100%  Weight:        Intake/Output Summary (Last 24 hours) at 04/01/2020 1742 Last data filed at 04/01/2020 1300 Gross per 24 hour  Intake 580.08 ml  Output --  Net 580.08 ml   Filed Weights   03/29/20 2144 03/30/20 1825  Weight: 86 kg 83.1 kg    Examination:  General exam: frail, elderly, chronically ill appearing female, Appears calm and comfortable  Respiratory system: BBS shallow, no rales heard. Respiratory effort normal. Cardiovascular system: irregularly irregular normal S1 & S2 heard. No JVD, murmurs, rubs, gallops or clicks. No pedal edema. Gastrointestinal system: Abdomen is nondistended, soft and nontender. No organomegaly or masses felt. Normal bowel sounds heard. Central nervous system: Alert and oriented. No focal neurological deficits. Extremities: Symmetric 5 x 5 power. Skin: No rashes, lesions or ulcers Psychiatry: Judgement and insight appear poor. Mood & affect appropriate.   Data Reviewed: I have personally reviewed following labs and imaging studies  CBC: Recent Labs  Lab 03/30/20 0053 03/31/20 0524 04/01/20 0926  WBC 18.9* 21.7* 16.2*  NEUTROABS 17.0*  --  13.1*  HGB 10.0* 7.9* 8.0*  HCT 33.5* 27.4* 27.3*  MCV 90.8 93.2 92.9  PLT 220 221 128    Basic Metabolic Panel: Recent Labs  Lab 03/30/20 0053 03/31/20 0524 04/01/20 0926  NA 127* 133* 135  K 3.9 3.9 3.5  CL 89* 94* 99  CO2 28 28 26   GLUCOSE 222* 154* 75  BUN 51* 65* 69*  CREATININE 2.65* 3.09* 3.16*  CALCIUM 8.9 9.0 8.8*  MG  --  2.4  --     GFR: Estimated Creatinine Clearance: 13.3 mL/min (A) (by C-G formula based on SCr of 3.16 mg/dL (H)).  Liver Function Tests: Recent Labs  Lab 03/30/20 0053 03/31/20 0524  AST 57* 67*  ALT 39 47*  ALKPHOS 175* 131*  BILITOT 0.8 1.0  PROT  6.7 5.6*  ALBUMIN 2.5* 2.1*    CBG: Recent Labs  Lab 03/31/20 1617 03/31/20 2035 04/01/20 0722 04/01/20 1059 04/01/20 1639  GLUCAP 113* 135* 89 69* 63*    Recent Results (from the past 240 hour(s))  SARS Coronavirus 2 by RT PCR (hospital order, performed in [facility] hospital lab) Nasopharyngeal Nasopharyngeal Swab     Status: None   Collection Time: 03/30/20 12:45 AM   Specimen: Nasopharyngeal Swab  Result Value Ref Range Status   SARS Coronavirus 2 NEGATIVE NEGATIVE Final    Comment: (NOTE) SARS-CoV-2 target nucleic acids are NOT DETECTED.  The SARS-CoV-2 RNA is generally detectable in upper and lower respiratory specimens during the acute phase of infection. The lowest concentration of SARS-CoV-2 viral copies this assay can detect  is 250 copies / mL. A negative result does not preclude SARS-CoV-2 infection and should not be used as the sole basis for treatment or other patient management decisions.  A negative result may occur with improper specimen collection / handling, submission of specimen other than nasopharyngeal swab, presence of viral mutation(s) within the areas targeted by this assay, and inadequate number of viral copies (<250 copies / mL). A negative result must be combined with clinical observations, patient history, and epidemiological information.  Fact Sheet for Patients:   StrictlyIdeas.no  Fact Sheet for Healthcare Providers: BankingDealers.co.za  This test is not yet approved or  cleared by the [place] FDA and has been authorized for detection and/or diagnosis of SARS-CoV-2 by FDA under an Emergency Use Authorization (EUA).  This EUA will remain in effect (meaning this test can be used) for the duration of the COVID-19 declaration under Section 564(b)(1) of the Act, 21 U.S.C. section 360bbb-3(b)(1), unless the authorization is terminated or revoked sooner.  Performed at [facility],  [address] [place], [place] [address]   Culture, blood (routine x 2)     Status: Abnormal   Collection Time: 03/30/20 12:45 AM   Specimen: BLOOD  Result Value Ref Range Status   Specimen Description   Final    BLOOD Performed at [facility], [address], [place], [place] [address]    Special Requests   Final    Normal Performed at [facility], [address], [place], [place] [address]    Culture  Setup Time   Final    GRAM POSITIVE COCCI IN BOTH AEROBIC AND ANAEROBIC BOTTLES Gram Stain Report Called to,Read Back By and Verified With: WHITE,M RN @1510  03/30/20 BY JONES,T Performed at [place] ID to follow CRITICAL RESULT CALLED TO, READ BACK BY AND VERIFIED WITH: Gavin Pound RN 2134 03/30/20 A BROWNING Performed at [facility], [place] [address], [place],  [address]    Culture ENTEROCOCCUS FAECALIS (A)  Final   Report Status 04/01/2020 FINAL  Final   Organism ID, Bacteria ENTEROCOCCUS FAECALIS  Final      Susceptibility   Enterococcus faecalis - MIC*    AMPICILLIN <=2 SENSITIVE Sensitive     VANCOMYCIN 1 SENSITIVE Sensitive     GENTAMICIN SYNERGY SENSITIVE Sensitive     * ENTEROCOCCUS FAECALIS  Blood Culture ID Panel (Reflexed)     Status: Abnormal   Collection Time: 03/30/20 12:45 AM  Result Value Ref Range Status   Enterococcus faecalis DETECTED (A) NOT DETECTED Final    Comment: CRITICAL RESULT CALLED TO, READ BACK BY AND VERIFIED WITH: A LEONARD RN 2134 03/30/20 A BROWNING    Enterococcus Faecium NOT DETECTED NOT DETECTED Final   Listeria monocytogenes NOT DETECTED NOT DETECTED Final   Staphylococcus species NOT DETECTED NOT DETECTED Final   Staphylococcus aureus (BCID) NOT DETECTED NOT DETECTED Final   Staphylococcus epidermidis NOT DETECTED NOT DETECTED Final   Staphylococcus lugdunensis NOT DETECTED NOT DETECTED Final   Streptococcus species NOT DETECTED NOT DETECTED Final   Streptococcus agalactiae NOT DETECTED NOT DETECTED Final    Streptococcus pneumoniae NOT DETECTED NOT DETECTED Final   Streptococcus pyogenes NOT DETECTED NOT DETECTED Final   A.calcoaceticus-baumannii NOT DETECTED NOT DETECTED Final   Bacteroides fragilis NOT DETECTED NOT DETECTED Final   Enterobacterales NOT DETECTED NOT DETECTED Final   Enterobacter cloacae complex NOT DETECTED NOT DETECTED Final   Escherichia coli NOT DETECTED NOT DETECTED Final   Klebsiella aerogenes NOT DETECTED  NOT DETECTED Final   Klebsiella oxytoca NOT DETECTED NOT DETECTED Final   Klebsiella pneumoniae NOT DETECTED NOT DETECTED Final   Proteus species NOT DETECTED NOT DETECTED Final   Salmonella species NOT DETECTED NOT DETECTED Final   Serratia marcescens NOT DETECTED NOT DETECTED Final   Haemophilus influenzae NOT DETECTED NOT DETECTED Final   Neisseria meningitidis NOT DETECTED NOT DETECTED Final   Pseudomonas aeruginosa NOT DETECTED NOT DETECTED Final   Stenotrophomonas maltophilia NOT DETECTED NOT DETECTED Final   Candida albicans NOT DETECTED NOT DETECTED Final   Candida auris NOT DETECTED NOT DETECTED Final   Candida glabrata NOT DETECTED NOT DETECTED Final   Candida krusei NOT DETECTED NOT DETECTED Final   Candida parapsilosis NOT DETECTED NOT DETECTED Final   Candida tropicalis NOT DETECTED NOT DETECTED Final   Cryptococcus neoformans/gattii NOT DETECTED NOT DETECTED Final   Vancomycin resistance NOT DETECTED NOT DETECTED Final    Comment: Performed at [facility], [place] [address] [place], [place] [address]  Culture, blood (routine x 2)     Status: Abnormal   Collection Time: 03/30/20  1:02 AM   Specimen: BLOOD  Result Value Ref Range Status   Specimen Description   Final    BLOOD LEFT ANTECUBITAL Performed at [facility], [address] [place], [place] [address]    Special Requests   Final    BOTTLES DRAWN AEROBIC AND ANAEROBIC Blood Culture adequate volume Performed at [facility], [address], [place], [place] [address]    Culture  Setup  Time   Final    GRAM POSITIVE COCCI IN BOTH AEROBIC AND ANAEROBIC BOTTLES Gram Stain Report Called to,Read Back By and Verified With: WHITE,M RN @1510  03/30/20 BY JONES,T Performed at [place] TO, READ BACK BY AND VERIFIED WITH: A LEONARD RN 2134 03/30/20 A BROWNING    Culture (A)  Final    ENTEROCOCCUS FAECALIS SUSCEPTIBILITIES PERFORMED ON PREVIOUS CULTURE WITHIN THE LAST 5 DAYS. Performed at [facility], [place] [address], [place], [place] [address]    Report Status 04/01/2020 FINAL  Final  Urine culture     Status: None   Collection Time: 03/30/20  4:50 AM   Specimen: Urine, Catheterized  Result Value Ref Range Status   Specimen Description   Final    URINE, CATHETERIZED Performed at [facility], [address], [place], [place] [address]    Special Requests   Final    Normal Performed at [facility], [address] [place], [place] [address]    Culture   Final    NO GROWTH Performed at [facility], [place] [address], [place], [place] [address]    Report Status 03/31/2020 FINAL  Final  Culture, blood (Routine X 2) w Reflex to ID Panel     Status: None (Preliminary result)   Collection Time: 03/31/20  8:52 AM   Specimen: BLOOD  Result Value Ref Range Status   Specimen Description BLOOD LEFT ANTECUBITAL  Final   Special Requests   Final    BOTTLES DRAWN AEROBIC AND ANAEROBIC Blood Culture adequate volume   Culture   Final    NO GROWTH < 24 HOURS Performed at [facility], [address] [place], [place] [address]    Report Status PENDING  Incomplete     Radiology Studies: ECHOCARDIOGRAM COMPLETE  Result Date: 03/31/2020    ECHOCARDIOGRAM REPORT   Patient Name:   Amy Wiggins Date of  Exam: 03/31/2020 Medical Rec #:  [id number]         Height:       64.0 in Accession #:    [id number]        Weight:       183.2 lb Date of Birth:  10/03/1931        BSA:          1.885 m Patient Age:    83 years           BP:           118/47 mmHg Patient Gender: F                 HR:           87 bpm. Exam Location:  [facility] Procedure: 2D Echo, Cardiac Doppler and Color Doppler Indications:    Bacteremia R78.81  History:        Patient has prior history of Echocardiogram examinations, most                 recent 07/28/2019. Stroke, Arrythmias:Atrial Fibrillation; Risk                 Factors:Hypertension, Diabetes and Dyslipidemia. CKD (chronic                 kidney disease) stage 4, GFR 15-29 ml/min.  Sonographer:    Alvino Chapel RCS Referring Phys: [place]  1. Left ventricular ejection fraction, by estimation, is 65 to 70%. The left ventricle has normal function. The left ventricle has no regional wall motion abnormalities. There is moderate left ventricular hypertrophy. Left ventricular diastolic parameters are indeterminate.  2. Right ventricular systolic function is normal. The right ventricular size is normal.  3. Left atrial size was severely dilated.  4. Right atrial size was mildly dilated.  5. The mitral valve is abnormal. Mild mitral valve regurgitation. Severe mitral stenosis. Severe mitral annular calcification.  6. The aortic valve has an indeterminant number of cusps. There is severe calcifcation of the aortic valve. There is severe thickening of the aortic valve. Aortic valve regurgitation is not visualized. Mild to moderate aortic valve stenosis.  7. The inferior vena cava is normal in size with greater than 50% respiratory variability, suggesting right atrial pressure of 3 mmHg. FINDINGS  Left Ventricle: Left ventricular ejection fraction, by estimation, is 65 to 70%. The left ventricle has normal function. The left ventricle has no regional wall motion abnormalities. The left ventricular internal cavity size was normal in size. There is  moderate left ventricular hypertrophy. Left ventricular diastolic parameters are indeterminate. Right Ventricle: The right ventricular size is  normal. No increase in right ventricular wall thickness. Right ventricular systolic function is normal. Left Atrium: Left atrial size was severely dilated. Right Atrium: Right atrial size was mildly dilated. Pericardium: There is no evidence of pericardial effusion. Mitral Valve: The mitral valve is abnormal. Severe mitral annular calcification. Mild mitral valve regurgitation. Severe mitral valve stenosis. MV peak gradient, 39.7 mmHg. The mean mitral valve gradient is 13.0 mmHg. Tricuspid Valve: The tricuspid valve is normal in structure. Tricuspid valve regurgitation is mild . No evidence of tricuspid stenosis. Aortic Valve: The aortic valve has an indeterminant number of cusps. There is severe calcifcation of the aortic valve. There is severe thickening of the aortic valve. There is severe aortic valve annular calcification. Aortic valve regurgitation is not visualized. Mild to moderate aortic stenosis is present. Aortic valve mean gradient  measures 12.0 mmHg. Aortic valve peak gradient measures 23.0 mmHg. Aortic valve area, by VTI measures 1.36 cm. Pulmonic Valve: The pulmonic valve was not well visualized. Pulmonic valve regurgitation is not visualized. No evidence of pulmonic stenosis. Aorta: The aortic root is normal in size and structure. Pulmonary Artery: Indeterminant PASP, inadequate TR jet. Venous: The inferior vena cava is normal in size with greater than 50% respiratory variability, suggesting right atrial pressure of 3 mmHg. IAS/Shunts: No atrial level shunt detected by color flow Doppler.  LEFT VENTRICLE PLAX 2D LVIDd:         4.60 cm  Diastology LVIDs:         2.70 cm  LV e' medial:    5.22 cm/s LV PW:         1.40 cm  LV E/e' medial:  42.3 LV IVS:        1.20 cm  LV e' lateral:   9.14 cm/s LVOT diam:     1.90 cm  LV E/e' lateral: 24.2 LV SV:         67 LV SV Index:   36 LVOT Area:     2.84 cm  RIGHT VENTRICLE RV S prime:     15.00 cm/s TAPSE (M-mode): 2.9 cm LEFT ATRIUM              Index        RIGHT ATRIUM           Index LA diam:        4.10 cm  2.18 cm/m  RA Area:     18.40 cm LA Vol (A2C):   132.0 ml 70.03 ml/m RA Volume:   48.00 ml  25.47 ml/m LA Vol (A4C):   111.0 ml 58.89 ml/m LA Biplane Vol: 126.0 ml 66.85 ml/m  AORTIC VALVE AV Area (Vmax):    1.33 cm AV Area (Vmean):   1.61 cm AV Area (VTI):     1.36 cm AV Vmax:           240.00 cm/s AV Vmean:          150.000 cm/s AV VTI:            0.493 m AV Peak Grad:      23.0 mmHg AV Mean Grad:      12.0 mmHg LVOT Vmax:         113.00 cm/s LVOT Vmean:        85.100 cm/s LVOT VTI:          0.236 m LVOT/AV VTI ratio: 0.48  AORTA Ao Root diam: 3.10 cm MITRAL VALVE MV Area (PHT): 2.20 cm     SHUNTS MV Area VTI:   0.66 cm     Systemic VTI:  0.24 m MV Peak grad:  39.7 mmHg    Systemic Diam: 1.90 cm MV Mean grad:  13.0 mmHg MV Vmax:       3.15 m/s MV Vmean:      147.0 cm/s MV Decel Time: 345 msec MV E velocity: 221.00 cm/s MV A velocity: 132.00 cm/s MV E/A ratio:  1.67 Carlyle Dolly MD Electronically signed by Carlyle Dolly MD Signature Date/Time: 03/31/2020/6:14:00 PM    Final    Scheduled Meds: . amiodarone  200 mg Oral Daily  . atorvastatin  20 mg Oral Daily  . Chlorhexidine Gluconate Cloth  6 each Topical Daily  . citalopram  20 mg Oral Daily  . diltiazem  120 mg Oral Daily  . insulin aspart  0-15  Units Subcutaneous TID WC  . insulin aspart  0-5 Units Subcutaneous QHS  . [START ON 04/02/2020] insulin detemir  10 Units Subcutaneous QHS  . ipratropium-albuterol  3 mL Nebulization TID  . mirtazapine  15 mg Oral QHS  . traZODone  75 mg Oral QHS   Continuous Infusions: . sodium chloride 1,000 mL (03/30/20 1606)  . ampicillin (OMNIPEN) IV 2 g (04/01/20 1507)  . dextrose 5 % and 0.9% NaCl 50 mL/hr at 04/01/20 1505     LOS: 2 days   Time spent: 37 mins   [place] Wynetta Emery, MD How to contact the [facility] Attending or Consulting provider [place] or covering provider during after hours [place], for this patient?  1. Check the care team in  [facility] and look for a) attending/consulting TRH provider listed and b) the [facility] team listed 2. Log into [email or web address] and use [place]'s universal password to access. If you do not have the password, please contact the hospital operator. 3. Locate the [facility] provider you are looking for under [facility] and page to a number that you can be directly reached. 4. If you still have difficulty reaching the provider, please page the [facility] (Director on Call) for the Hospitalists listed on amion for assistance.  04/01/2020, 5:42 PM

## 2020-04-01 NOTE — Progress Notes (Addendum)
1) BCx is amp sens Enterococcus 2) continue ampicillin 3) TTE result pending 4) repeat BCx for clearance pending

## 2020-04-01 NOTE — Progress Notes (Signed)
Has been drowsy today but was able to take crushed po meds in pudding .   Blood glucose low on two occasions and given D50 each occurrence.  Not eating or drinking much and Dr. Wynetta Emery aware and ordered D5 NS and a CT of the head.   Granddaughter was a bedside several hours today and said that patient is normally more alert, at home with 24/7 sitters and ambulates with walker and standby.  Fell at home due to sitter leaving patient alone in bathroom

## 2020-04-02 ENCOUNTER — Inpatient Hospital Stay (HOSPITAL_COMMUNITY): Payer: Medicare HMO

## 2020-04-02 DIAGNOSIS — I482 Chronic atrial fibrillation, unspecified: Secondary | ICD-10-CM | POA: Diagnosis not present

## 2020-04-02 DIAGNOSIS — R7881 Bacteremia: Secondary | ICD-10-CM | POA: Diagnosis not present

## 2020-04-02 DIAGNOSIS — K5909 Other constipation: Secondary | ICD-10-CM | POA: Diagnosis not present

## 2020-04-02 DIAGNOSIS — I609 Nontraumatic subarachnoid hemorrhage, unspecified: Secondary | ICD-10-CM | POA: Diagnosis not present

## 2020-04-02 LAB — CBC WITH DIFFERENTIAL/PLATELET
Abs Immature Granulocytes: 0.15 10*3/uL — ABNORMAL HIGH (ref 0.00–0.07)
Basophils Absolute: 0 10*3/uL (ref 0.0–0.1)
Basophils Relative: 0 %
Eosinophils Absolute: 0.3 10*3/uL (ref 0.0–0.5)
Eosinophils Relative: 2 %
HCT: 28.9 % — ABNORMAL LOW (ref 36.0–46.0)
Hemoglobin: 8.4 g/dL — ABNORMAL LOW (ref 12.0–15.0)
Immature Granulocytes: 1 %
Lymphocytes Relative: 2 %
Lymphs Abs: 0.4 10*3/uL — ABNORMAL LOW (ref 0.7–4.0)
MCH: 27.2 pg (ref 26.0–34.0)
MCHC: 29.1 g/dL — ABNORMAL LOW (ref 30.0–36.0)
MCV: 93.5 fL (ref 80.0–100.0)
Monocytes Absolute: 1.4 10*3/uL — ABNORMAL HIGH (ref 0.1–1.0)
Monocytes Relative: 9 %
Neutro Abs: 14.6 10*3/uL — ABNORMAL HIGH (ref 1.7–7.7)
Neutrophils Relative %: 86 %
Platelets: 277 10*3/uL (ref 150–400)
RBC: 3.09 MIL/uL — ABNORMAL LOW (ref 3.87–5.11)
RDW: 15.7 % — ABNORMAL HIGH (ref 11.5–15.5)
WBC: 16.9 10*3/uL — ABNORMAL HIGH (ref 4.0–10.5)
nRBC: 0 % (ref 0.0–0.2)

## 2020-04-02 LAB — COMPREHENSIVE METABOLIC PANEL
ALT: 41 U/L (ref 0–44)
AST: 48 U/L — ABNORMAL HIGH (ref 15–41)
Albumin: 2.3 g/dL — ABNORMAL LOW (ref 3.5–5.0)
Alkaline Phosphatase: 128 U/L — ABNORMAL HIGH (ref 38–126)
Anion gap: 8 (ref 5–15)
BUN: 65 mg/dL — ABNORMAL HIGH (ref 8–23)
CO2: 28 mmol/L (ref 22–32)
Calcium: 8.9 mg/dL (ref 8.9–10.3)
Chloride: 103 mmol/L (ref 98–111)
Creatinine, Ser: 2.93 mg/dL — ABNORMAL HIGH (ref 0.44–1.00)
GFR, Estimated: 15 mL/min — ABNORMAL LOW (ref >60)
Glucose, Bld: 204 mg/dL — ABNORMAL HIGH (ref 70–99)
Potassium: 3.4 mmol/L — ABNORMAL LOW (ref 3.5–5.1)
Sodium: 139 mmol/L (ref 135–145)
Total Bilirubin: 0.9 mg/dL (ref 0.3–1.2)
Total Protein: 6 g/dL — ABNORMAL LOW (ref 6.5–8.1)

## 2020-04-02 LAB — GLUCOSE, CAPILLARY
Glucose-Capillary: 205 mg/dL — ABNORMAL HIGH (ref 70–99)
Glucose-Capillary: 208 mg/dL — ABNORMAL HIGH (ref 70–99)
Glucose-Capillary: 182 mg/dL — ABNORMAL HIGH (ref 70–99)
Glucose-Capillary: 122 mg/dL — ABNORMAL HIGH (ref 70–99)

## 2020-04-02 MED ORDER — SODIUM CHLORIDE 0.9 % IV SOLN: INTRAVENOUS | Status: DC

## 2020-04-02 MED ORDER — INSULIN DETEMIR 100 UNIT/ML ~~LOC~~ SOLN
8.0000 [IU] | Freq: Every day | SUBCUTANEOUS | Status: DC
  Administered 2020-04-02 – 2020-04-06 (×5): 8 [IU] via SUBCUTANEOUS
  Filled 2020-04-02 (×8): qty 0.08

## 2020-04-02 NOTE — Progress Notes (Signed)
Repeat BCx are pending TTE with multiple abn on MV and Ao valve. No vegetations noted though.  Would consider TEE if this fits with her goals of care (palliative eval pending)

## 2020-04-02 NOTE — Progress Notes (Signed)
PROGRESS NOTE   Amy Wiggins  TKZ:[id number] DOB: 18-Apr-1931 DOA: 03/29/2020 PCP: Asencion Noble, MD   Chief Complaint  Patient presents with  . Fall   Level of care: Med-Surg  Brief Admission History:  87 y.o. female, with history of stroke, patent foreman ovale, paroxysmal atrial fibrillation, hypertension, hyperlipidemia, diabetes mellitus, depression, and more presents to the ED with a chief complaint of fall.  Patient reports that she was in the bathroom and he just stood up from the toilet when she fell.  She hit her head on the floor.  She did not lose consciousness.  She reports that before her fall she did not have any chest pain, palpitation, shortness of breath.  She reports that at this time she is having some blurry vision in her left eye.  Patient had her knees and hurt her left hip during the fall.  CT C-spine and head showed small amount of acute subarachnoid hemorrhage in setting of trauma, left frontal scalp and supra orbital swelling and soft tissue hematoma.  No acute fracture of C-spine, C-spine spondylitic changes chronic.  CT lumbar shows no acute fracture or static subluxation of lumbar spine.  Chest x-ray shows retrocardiac opacity that could be atelectasis versus infectious.  X-ray hip shows no acute findings.  Neurosurgery consulted from ED and reports patient can stay here for repeat CT which revealed stable bleed.    Assessment & Plan:   Principal Problem:   Subarachnoid hemorrhage (HCC) Active Problems:   Bacteremia due to Enterococcus   CKD (chronic kidney disease) stage 4, GFR 15-29 ml/min (HCC)   Type 2 diabetes mellitus with circulatory disorder (HCC)   HFpEF/Chronic diastolic congestive heart failure (HCC)   Atrial fibrillation, chronic (HCC)   Hypertension associated with stage 4 chronic kidney disease due to type 2 diabetes mellitus (HCC)   Hyperlipidemia associated with type 2 diabetes mellitus (HCC)   Gastroesophageal reflux disease   Chronic  constipation   Intracranial hemorrhage (HCC)   Gait instability   Fall   Fever  1. Sepsis secondary to enterococcal bacteremia - appreciate ID assistance, VRE ruled out.  Continue IV ampicillin.  Continue supportive measures.  TTE with no vegetations seen but valvular abnormalities.  TEE if family wants to pursue.  Goals of care discussions with family planned for 2/7 with palliative care service.  Repeated BC no growth to date.  2. Fall at home - Pt remains very unstable to ambulate, likely will SNF but would benefit from residential hospice.  I would not restart anticoagulation at this time due to extremely high fall risk. 1 year mortality risk very high.  Requested palliative consult.  Started discussions with granddaughter.  3. Subarachnoid hemorrhage - small hemorrhage noted.  Repeated CT head after 12 hours with no change seen.  Neurosurgery follow up outpatient.  Repeat CT in AM 2/6.   4. Chronic atrial fibrillation - rate controlled now, no anticoagulation due to intracranial hemorrhage.  5. Stage 4 CKD - stable, following and adjusting medications as needed for CKD.   6. Hyperlipidemia - stable. Resume home meds.   7. Type 2 DM with renal complications - slow dextrose infusion as she was having lows from not eating.  8. Hypoglycemia - resolved now.    9. DNR - present on admission.  With high 1 year mortality risk will ask for palliative consultation for goals of care.    DVT prophylaxis:  SCDs Code Status:  DNR Family Communication: grandaughter 04/01/20 Disposition: declines SNF, return  home  Status is: Inpatient  Remains inpatient appropriate because:Inpatient level of care appropriate due to severity of illness  Dispo: The patient is from: Home              Anticipated d/c is to: TBD              Anticipated d/c date is: 2 days              Patient currently is not medically stable to d/c.   Difficult to place patient No  Consultants:   PT   Procedures:   CT head x 2    Antimicrobials:    Subjective: Pt c/o neck pain.   Objective: Vitals:   04/02/20 0506 04/02/20 0734 04/02/20 1309 04/02/20 1436  BP: 140/69  (!) 158/73   Pulse: 87  81   Resp: 20  18   Temp: 98.9 F (37.2 C)  98.1 F (36.7 C)   TempSrc:      SpO2: 99% 98% 96% 97%  Weight:        Intake/Output Summary (Last 24 hours) at 04/02/2020 1459 Last data filed at 04/02/2020 0900 Gross per 24 hour  Intake 1180 ml  Output 1150 ml  Net 30 ml   Filed Weights   03/29/20 2144 03/30/20 1825  Weight: 86 kg 83.1 kg    Examination:  General exam: frail, elderly, chronically ill appearing female, Appears calm and comfortable  Respiratory system: BBS shallow, no rales heard. Respiratory effort normal. Cardiovascular system: irregularly irregular normal S1 & S2 heard. No JVD, murmurs, rubs, gallops or clicks. No pedal edema. Gastrointestinal system: Abdomen is nondistended, soft and nontender. No organomegaly or masses felt. Normal bowel sounds heard. Central nervous system: Alert and oriented. No focal neurological deficits. Extremities: Symmetric 5 x 5 power. Skin: No rashes, lesions or ulcers Psychiatry: Judgement and insight appear poor. Mood & affect appropriate.   Data Reviewed: I have personally reviewed following labs and imaging studies  CBC: Recent Labs  Lab 03/30/20 0053 03/31/20 0524 04/01/20 0926 04/02/20 0609  WBC 18.9* 21.7* 16.2* 16.9*  NEUTROABS 17.0*  --  13.1* 14.6*  HGB 10.0* 7.9* 8.0* 8.4*  HCT 33.5* 27.4* 27.3* 28.9*  MCV 90.8 93.2 92.9 93.5  PLT 220 221 233 130    Basic Metabolic Panel: Recent Labs  Lab 03/30/20 0053 03/31/20 0524 04/01/20 0926 04/02/20 0609  NA 127* 133* 135 139  K 3.9 3.9 3.5 3.4*  CL 89* 94* 99 103  CO2 28 28 26 28   GLUCOSE 222* 154* 75 204*  BUN 51* 65* 69* 65*  CREATININE 2.65* 3.09* 3.16* 2.93*  CALCIUM 8.9 9.0 8.8* 8.9  MG  --  2.4  --   --     GFR: Estimated Creatinine Clearance: 14.4 mL/min (A) (by C-G formula  based on SCr of 2.93 mg/dL (H)).  Liver Function Tests: Recent Labs  Lab 03/30/20 0053 03/31/20 0524 04/02/20 0609  AST 57* 67* 48*  ALT 39 47* 41  ALKPHOS 175* 131* 128*  BILITOT 0.8 1.0 0.9  PROT 6.7 5.6* 6.0*  ALBUMIN 2.5* 2.1* 2.3*    CBG: Recent Labs  Lab 04/01/20 1059 04/01/20 1639 04/01/20 2056 04/02/20 0725 04/02/20 1106  GLUCAP 69* 63* 128* 205* 208*    Recent Results (from the past 240 hour(s))  SARS Coronavirus 2 by RT PCR (hospital order, performed in [facility] hospital lab) Nasopharyngeal Nasopharyngeal Swab     Status: None   Collection Time: 03/30/20 12:45 AM  Specimen: Nasopharyngeal Swab  Result Value Ref Range Status   SARS Coronavirus 2 NEGATIVE NEGATIVE Final    Comment: (NOTE) SARS-CoV-2 target nucleic acids are NOT DETECTED.  The SARS-CoV-2 RNA is generally detectable in upper and lower respiratory specimens during the acute phase of infection. The lowest concentration of SARS-CoV-2 viral copies this assay can detect is 250 copies / mL. A negative result does not preclude SARS-CoV-2 infection and should not be used as the sole basis for treatment or other patient management decisions.  A negative result may occur with improper specimen collection / handling, submission of specimen other than nasopharyngeal swab, presence of viral mutation(s) within the areas targeted by this assay, and inadequate number of viral copies (<250 copies / mL). A negative result must be combined with clinical observations, patient history, and epidemiological information.  Fact Sheet for Patients:   StrictlyIdeas.no  Fact Sheet for Healthcare Providers: BankingDealers.co.za  This test is not yet approved or  cleared by the [place] FDA and has been authorized for detection and/or diagnosis of SARS-CoV-2 by FDA under an Emergency Use Authorization (EUA).  This EUA will remain in effect (meaning this test can  be used) for the duration of the COVID-19 declaration under Section 564(b)(1) of the Act, 21 U.S.C. section 360bbb-3(b)(1), unless the authorization is terminated or revoked sooner.  Performed at [facility], [address], [place], [place] [address]   Culture, blood (routine x 2)     Status: Abnormal   Collection Time: 03/30/20 12:45 AM   Specimen: BLOOD  Result Value Ref Range Status   Specimen Description   Final    BLOOD Performed at [facility], [address] [place], [place] [address]    Special Requests   Final    Normal Performed at [facility], [address], [place], [place] [address]    Culture  Setup Time   Final    GRAM POSITIVE COCCI IN BOTH AEROBIC AND ANAEROBIC BOTTLES Gram Stain Report Called to,Read Back By and Verified With: WHITE,M RN @1510  03/30/20 BY JONES,T Performed at [place] ID to follow CRITICAL RESULT CALLED TO, READ BACK BY AND VERIFIED WITH: Gavin Pound RN 2134 03/30/20 A BROWNING Performed at [facility], [place] [address] [place], [place] [address]    Culture ENTEROCOCCUS FAECALIS (A)  Final   Report Status 04/01/2020 FINAL  Final   Organism ID, Bacteria ENTEROCOCCUS FAECALIS  Final      Susceptibility   Enterococcus faecalis - MIC*    AMPICILLIN <=2 SENSITIVE Sensitive     VANCOMYCIN 1 SENSITIVE Sensitive     GENTAMICIN SYNERGY SENSITIVE Sensitive     * ENTEROCOCCUS FAECALIS  Blood Culture ID Panel (Reflexed)     Status: Abnormal   Collection Time: 03/30/20 12:45 AM  Result Value Ref Range Status   Enterococcus faecalis DETECTED (A) NOT DETECTED Final    Comment: CRITICAL RESULT CALLED TO, READ BACK BY AND VERIFIED WITH: A LEONARD RN 2134 03/30/20 A BROWNING    Enterococcus Faecium NOT DETECTED NOT DETECTED Final   Listeria monocytogenes NOT DETECTED NOT DETECTED Final   Staphylococcus species NOT DETECTED NOT DETECTED Final   Staphylococcus aureus (BCID) NOT DETECTED NOT DETECTED Final   Staphylococcus epidermidis  NOT DETECTED NOT DETECTED Final   Staphylococcus lugdunensis NOT DETECTED NOT DETECTED Final   Streptococcus species NOT DETECTED NOT DETECTED Final   Streptococcus agalactiae NOT DETECTED NOT DETECTED Final   Streptococcus pneumoniae NOT DETECTED NOT DETECTED Final  Streptococcus pyogenes NOT DETECTED NOT DETECTED Final   A.calcoaceticus-baumannii NOT DETECTED NOT DETECTED Final   Bacteroides fragilis NOT DETECTED NOT DETECTED Final   Enterobacterales NOT DETECTED NOT DETECTED Final   Enterobacter cloacae complex NOT DETECTED NOT DETECTED Final   Escherichia coli NOT DETECTED NOT DETECTED Final   Klebsiella aerogenes NOT DETECTED NOT DETECTED Final   Klebsiella oxytoca NOT DETECTED NOT DETECTED Final   Klebsiella pneumoniae NOT DETECTED NOT DETECTED Final   Proteus species NOT DETECTED NOT DETECTED Final   Salmonella species NOT DETECTED NOT DETECTED Final   Serratia marcescens NOT DETECTED NOT DETECTED Final   Haemophilus influenzae NOT DETECTED NOT DETECTED Final   Neisseria meningitidis NOT DETECTED NOT DETECTED Final   Pseudomonas aeruginosa NOT DETECTED NOT DETECTED Final   Stenotrophomonas maltophilia NOT DETECTED NOT DETECTED Final   Candida albicans NOT DETECTED NOT DETECTED Final   Candida auris NOT DETECTED NOT DETECTED Final   Candida glabrata NOT DETECTED NOT DETECTED Final   Candida krusei NOT DETECTED NOT DETECTED Final   Candida parapsilosis NOT DETECTED NOT DETECTED Final   Candida tropicalis NOT DETECTED NOT DETECTED Final   Cryptococcus neoformans/gattii NOT DETECTED NOT DETECTED Final   Vancomycin resistance NOT DETECTED NOT DETECTED Final    Comment: Performed at [facility], [address], [place], [place] [address]  Culture, blood (routine x 2)     Status: Abnormal   Collection Time: 03/30/20  1:02 AM   Specimen: BLOOD  Result Value Ref Range Status   Specimen Description   Final    BLOOD LEFT ANTECUBITAL Performed at [facility], [address] [place], [place] [address]    Special Requests   Final    BOTTLES DRAWN AEROBIC AND ANAEROBIC Blood Culture adequate volume Performed at [facility], [address] [place], [place] [address]    Culture  Setup Time   Final    GRAM POSITIVE COCCI IN BOTH AEROBIC AND ANAEROBIC BOTTLES Gram Stain Report Called to,Read Back By and Verified With: WHITE,M RN @1510  03/30/20 BY JONES,T Performed at [place] TO, READ BACK BY AND VERIFIED WITH: A LEONARD RN 2134 03/30/20 A BROWNING    Culture (A)  Final    ENTEROCOCCUS FAECALIS SUSCEPTIBILITIES PERFORMED ON PREVIOUS CULTURE WITHIN THE LAST 5 DAYS. Performed at [facility], [place] [address], [place], [place] [address]    Report Status 04/01/2020 FINAL  Final  Urine culture     Status: None   Collection Time: 03/30/20  4:50 AM   Specimen: Urine, Catheterized  Result Value Ref Range Status   Specimen Description   Final    URINE, CATHETERIZED Performed at [facility], [address] [place], [place] [address]    Special Requests   Final    Normal Performed at [facility], [address], [place], [place] [address]    Culture   Final    NO GROWTH Performed at [facility], [place] [address] [place], [place] [address]    Report Status 03/31/2020 FINAL  Final  Culture, blood (Routine X 2) w Reflex to ID Panel     Status: None (Preliminary result)   Collection Time: 03/31/20  8:52 AM   Specimen: BLOOD  Result Value Ref Range Status   Specimen Description BLOOD LEFT ANTECUBITAL  Final   Special Requests   Final    BOTTLES DRAWN AEROBIC AND ANAEROBIC Blood Culture adequate volume   Culture  Final    NO GROWTH 2 DAYS Performed at [facility], [address] [place], [place] [address]    Report Status PENDING  Incomplete  Culture, blood (routine x 2)     Status: None (Preliminary result)   Collection Time: 04/01/20  4:51 PM   Specimen: BLOOD  Result Value Ref Range  Status   Specimen Description BLOOD RIGHT ANTECUBITAL  Final   Special Requests   Final    BOTTLES DRAWN AEROBIC AND ANAEROBIC Blood Culture adequate volume   Culture   Final    NO GROWTH < 24 HOURS Performed at [facility], [address], [place], [place] [address]    Report Status PENDING  Incomplete  Culture, blood (routine x 2)     Status: None (Preliminary result)   Collection Time: 04/01/20  4:59 PM   Specimen: BLOOD RIGHT HAND  Result Value Ref Range Status   Specimen Description BLOOD RIGHT HAND  Final   Special Requests   Final    BOTTLES DRAWN AEROBIC AND ANAEROBIC Blood Culture adequate volume   Culture   Final    NO GROWTH < 24 HOURS Performed at [facility], [address], [place], [place] [address]    Report Status PENDING  Incomplete     Radiology Studies: CT HEAD WO CONTRAST  Result Date: 04/02/2020 CLINICAL DATA:  87 year old female with headache. Status post fall with trace subarachnoid hemorrhage. EXAM: CT HEAD WITHOUT CONTRAST TECHNIQUE: Contiguous axial images were obtained from the base of the skull through the vertex without intravenous contrast. COMPARISON:  Head CTs 03/30/2020 and earlier. FINDINGS: Brain: Partially resolved now subtle subarachnoid hemorrhage at the superior central sulcus (series 2, image 28). No intraventricular blood or ventriculomegaly. No new intracranial hemorrhage identified. No midline shift, mass effect, or evidence of intracranial mass lesion. Stable encephalomalacia right superior perirolandic cortex, bilateral occipital lobes, left thalamus, left cerebellum. No cortically based acute infarct identified. Vascular: Calcified atherosclerosis at the skull base. No suspicious intracranial vascular hyperdensity. Skull: Stable.  Hyperostosis.  No skull fracture identified. Sinuses/Orbits: Increasing sphenoid and right frontal sinus mucosal thickening with small volume sinus fluid. Tympanic cavities and mastoids remain clear. Other: Calcified  scalp vessel atherosclerosis. Small left vertex scalp hematoma. Orbits soft tissues are stable. IMPRESSION: 1. Decreasing subarachnoid hemorrhage at the left superior convexity with only subtle residual. 2. No new intracranial abnormality. Stable multifocal chronic infarcts. 3. Small left vertex scalp hematoma without underlying skull fracture. Electronically Signed   By: Genevie Ann M.D.   On: 04/02/2020 07:11   ECHOCARDIOGRAM COMPLETE  Result Date: 03/31/2020    ECHOCARDIOGRAM REPORT   Patient Name:   RUMAISA SCHNETZER Date of Exam: 03/31/2020 Medical Rec #:  [id number]         Height:       64.0 in Accession #:    [id number]        Weight:       183.2 lb Date of Birth:  15-Oct-1931        BSA:          1.885 m Patient Age:    54 years          BP:           118/47 mmHg Patient Gender: F                 HR:           87 bpm. Exam Location:  [facility] Procedure: 2D Echo, Cardiac Doppler  and Color Doppler Indications:    Bacteremia R78.81  History:        Patient has prior history of Echocardiogram examinations, most                 recent 07/28/2019. Stroke, Arrythmias:Atrial Fibrillation; Risk                 Factors:Hypertension, Diabetes and Dyslipidemia. CKD (chronic                 kidney disease) stage 4, GFR 15-29 ml/min.  Sonographer:    Alvino Chapel RCS Referring Phys: [place]  1. Left ventricular ejection fraction, by estimation, is 65 to 70%. The left ventricle has normal function. The left ventricle has no regional wall motion abnormalities. There is moderate left ventricular hypertrophy. Left ventricular diastolic parameters are indeterminate.  2. Right ventricular systolic function is normal. The right ventricular size is normal.  3. Left atrial size was severely dilated.  4. Right atrial size was mildly dilated.  5. The mitral valve is abnormal. Mild mitral valve regurgitation. Severe mitral stenosis. Severe mitral annular calcification.  6. The aortic valve has an  indeterminant number of cusps. There is severe calcifcation of the aortic valve. There is severe thickening of the aortic valve. Aortic valve regurgitation is not visualized. Mild to moderate aortic valve stenosis.  7. The inferior vena cava is normal in size with greater than 50% respiratory variability, suggesting right atrial pressure of 3 mmHg. FINDINGS  Left Ventricle: Left ventricular ejection fraction, by estimation, is 65 to 70%. The left ventricle has normal function. The left ventricle has no regional wall motion abnormalities. The left ventricular internal cavity size was normal in size. There is  moderate left ventricular hypertrophy. Left ventricular diastolic parameters are indeterminate. Right Ventricle: The right ventricular size is normal. No increase in right ventricular wall thickness. Right ventricular systolic function is normal. Left Atrium: Left atrial size was severely dilated. Right Atrium: Right atrial size was mildly dilated. Pericardium: There is no evidence of pericardial effusion. Mitral Valve: The mitral valve is abnormal. Severe mitral annular calcification. Mild mitral valve regurgitation. Severe mitral valve stenosis. MV peak gradient, 39.7 mmHg. The mean mitral valve gradient is 13.0 mmHg. Tricuspid Valve: The tricuspid valve is normal in structure. Tricuspid valve regurgitation is mild . No evidence of tricuspid stenosis. Aortic Valve: The aortic valve has an indeterminant number of cusps. There is severe calcifcation of the aortic valve. There is severe thickening of the aortic valve. There is severe aortic valve annular calcification. Aortic valve regurgitation is not visualized. Mild to moderate aortic stenosis is present. Aortic valve mean gradient measures 12.0 mmHg. Aortic valve peak gradient measures 23.0 mmHg. Aortic valve area, by VTI measures 1.36 cm. Pulmonic Valve: The pulmonic valve was not well visualized. Pulmonic valve regurgitation is not visualized. No evidence  of pulmonic stenosis. Aorta: The aortic root is normal in size and structure. Pulmonary Artery: Indeterminant PASP, inadequate TR jet. Venous: The inferior vena cava is normal in size with greater than 50% respiratory variability, suggesting right atrial pressure of 3 mmHg. IAS/Shunts: No atrial level shunt detected by color flow Doppler.  LEFT VENTRICLE PLAX 2D LVIDd:         4.60 cm  Diastology LVIDs:         2.70 cm  LV e' medial:    5.22 cm/s LV PW:         1.40 cm  LV E/e' medial:  42.3 LV IVS:        1.20 cm  LV e' lateral:   9.14 cm/s LVOT diam:     1.90 cm  LV E/e' lateral: 24.2 LV SV:         67 LV SV Index:   36 LVOT Area:     2.84 cm  RIGHT VENTRICLE RV S prime:     15.00 cm/s TAPSE (M-mode): 2.9 cm LEFT ATRIUM              Index       RIGHT ATRIUM           Index LA diam:        4.10 cm  2.18 cm/m  RA Area:     18.40 cm LA Vol (A2C):   132.0 ml 70.03 ml/m RA Volume:   48.00 ml  25.47 ml/m LA Vol (A4C):   111.0 ml 58.89 ml/m LA Biplane Vol: 126.0 ml 66.85 ml/m  AORTIC VALVE AV Area (Vmax):    1.33 cm AV Area (Vmean):   1.61 cm AV Area (VTI):     1.36 cm AV Vmax:           240.00 cm/s AV Vmean:          150.000 cm/s AV VTI:            0.493 m AV Peak Grad:      23.0 mmHg AV Mean Grad:      12.0 mmHg LVOT Vmax:         113.00 cm/s LVOT Vmean:        85.100 cm/s LVOT VTI:          0.236 m LVOT/AV VTI ratio: 0.48  AORTA Ao Root diam: 3.10 cm MITRAL VALVE MV Area (PHT): 2.20 cm     SHUNTS MV Area VTI:   0.66 cm     Systemic VTI:  0.24 m MV Peak grad:  39.7 mmHg    Systemic Diam: 1.90 cm MV Mean grad:  13.0 mmHg MV Vmax:       3.15 m/s MV Vmean:      147.0 cm/s MV Decel Time: 345 msec MV E velocity: 221.00 cm/s MV A velocity: 132.00 cm/s MV E/A ratio:  1.67 Carlyle Dolly MD Electronically signed by Carlyle Dolly MD Signature Date/Time: 03/31/2020/6:14:00 PM    Final    Scheduled Meds: . amiodarone  200 mg Oral Daily  . atorvastatin  20 mg Oral Daily  . Chlorhexidine Gluconate Cloth  6 each  Topical Daily  . citalopram  20 mg Oral Daily  . diltiazem  120 mg Oral Daily  . insulin aspart  0-15 Units Subcutaneous TID WC  . insulin aspart  0-5 Units Subcutaneous QHS  . insulin detemir  10 Units Subcutaneous QHS  . ipratropium-albuterol  3 mL Nebulization TID  . mirtazapine  15 mg Oral QHS  . traZODone  75 mg Oral QHS   Continuous Infusions: . sodium chloride 1,000 mL (03/30/20 1606)  . ampicillin (OMNIPEN) IV 2 g (04/02/20 1316)  . dextrose 5 % and 0.9% NaCl 55 mL/hr at 04/02/20 0833     LOS: 3 days   Time spent: 35 mins   Arraya Buck Wynetta Emery, MD How to contact the [facility] Attending or Consulting provider [place] or covering provider during after hours [place], for this patient?  1. Check the care team in [facility] and look for a) attending/consulting [facility] provider listed and b) the [facility] team listed  2. Log into [email or web address] and use [place]'s universal password to access. If you do not have the password, please contact the hospital operator. 3. Locate the [facility] provider you are looking for under [facility] and page to a number that you can be directly reached. 4. If you still have difficulty reaching the provider, please page the [facility] (Director on Call) for the Hospitalists listed on amion for assistance.  04/02/2020, 2:59 PM

## 2020-04-02 NOTE — Plan of Care (Signed)

## 2020-04-02 NOTE — Progress Notes (Signed)
MD notified of bladder scan 597ml, no recorded output for today, and prior in and out for retention.

## 2020-04-03 ENCOUNTER — Encounter (HOSPITAL_COMMUNITY): Payer: Self-pay | Admitting: Family Medicine

## 2020-04-03 ENCOUNTER — Inpatient Hospital Stay (HOSPITAL_COMMUNITY): Payer: Medicare HMO

## 2020-04-03 DIAGNOSIS — Z7189 Other specified counseling: Secondary | ICD-10-CM

## 2020-04-03 DIAGNOSIS — I482 Chronic atrial fibrillation, unspecified: Secondary | ICD-10-CM | POA: Diagnosis not present

## 2020-04-03 DIAGNOSIS — Z515 Encounter for palliative care: Secondary | ICD-10-CM

## 2020-04-03 DIAGNOSIS — W19XXXA Unspecified fall, initial encounter: Secondary | ICD-10-CM

## 2020-04-03 DIAGNOSIS — K5909 Other constipation: Secondary | ICD-10-CM | POA: Diagnosis not present

## 2020-04-03 DIAGNOSIS — R7881 Bacteremia: Secondary | ICD-10-CM | POA: Diagnosis not present

## 2020-04-03 DIAGNOSIS — I609 Nontraumatic subarachnoid hemorrhage, unspecified: Secondary | ICD-10-CM | POA: Diagnosis not present

## 2020-04-03 LAB — CBC WITH DIFFERENTIAL/PLATELET
Abs Immature Granulocytes: 0.17 10*3/uL — ABNORMAL HIGH (ref 0.00–0.07)
Basophils Absolute: 0 10*3/uL (ref 0.0–0.1)
Basophils Relative: 0 %
Eosinophils Absolute: 0.2 10*3/uL (ref 0.0–0.5)
Eosinophils Relative: 1 %
HCT: 25.8 % — ABNORMAL LOW (ref 36.0–46.0)
Hemoglobin: 7.2 g/dL — ABNORMAL LOW (ref 12.0–15.0)
Immature Granulocytes: 1 %
Lymphocytes Relative: 3 %
Lymphs Abs: 0.4 10*3/uL — ABNORMAL LOW (ref 0.7–4.0)
MCH: 26.9 pg (ref 26.0–34.0)
MCHC: 27.9 g/dL — ABNORMAL LOW (ref 30.0–36.0)
MCV: 96.3 fL (ref 80.0–100.0)
Monocytes Absolute: 1.1 10*3/uL — ABNORMAL HIGH (ref 0.1–1.0)
Monocytes Relative: 7 %
Neutro Abs: 14.6 10*3/uL — ABNORMAL HIGH (ref 1.7–7.7)
Neutrophils Relative %: 88 %
Platelets: 252 10*3/uL (ref 150–400)
RBC: 2.68 MIL/uL — ABNORMAL LOW (ref 3.87–5.11)
RDW: 15.7 % — ABNORMAL HIGH (ref 11.5–15.5)
WBC: 16.5 10*3/uL — ABNORMAL HIGH (ref 4.0–10.5)
nRBC: 0 % (ref 0.0–0.2)

## 2020-04-03 LAB — GLUCOSE, CAPILLARY
Glucose-Capillary: 188 mg/dL — ABNORMAL HIGH (ref 70–99)
Glucose-Capillary: 215 mg/dL — ABNORMAL HIGH (ref 70–99)
Glucose-Capillary: 160 mg/dL — ABNORMAL HIGH (ref 70–99)
Glucose-Capillary: 151 mg/dL — ABNORMAL HIGH (ref 70–99)

## 2020-04-03 LAB — COMPREHENSIVE METABOLIC PANEL
ALT: 33 U/L (ref 0–44)
AST: 41 U/L (ref 15–41)
Albumin: 1.7 g/dL — ABNORMAL LOW (ref 3.5–5.0)
Alkaline Phosphatase: 99 U/L (ref 38–126)
Anion gap: 14 (ref 5–15)
BUN: 57 mg/dL — ABNORMAL HIGH (ref 8–23)
CO2: 23 mmol/L (ref 22–32)
Calcium: 7.4 mg/dL — ABNORMAL LOW (ref 8.9–10.3)
Chloride: 115 mmol/L — ABNORMAL HIGH (ref 98–111)
Creatinine, Ser: 2.33 mg/dL — ABNORMAL HIGH (ref 0.44–1.00)
GFR, Estimated: 20 mL/min — ABNORMAL LOW (ref >60)
Glucose, Bld: 170 mg/dL — ABNORMAL HIGH (ref 70–99)
Potassium: 3 mmol/L — ABNORMAL LOW (ref 3.5–5.1)
Sodium: 152 mmol/L — ABNORMAL HIGH (ref 135–145)
Total Bilirubin: 0.7 mg/dL (ref 0.3–1.2)
Total Protein: <3 g/dL — ABNORMAL LOW (ref 6.5–8.1)

## 2020-04-03 MED ORDER — POTASSIUM CHLORIDE IN NACL 20-0.45 MEQ/L-% IV SOLN: INTRAVENOUS | Status: DC

## 2020-04-03 MED ORDER — SODIUM CHLORIDE 0.9 % IV SOLN
2.0000 g | Freq: Two times a day (BID) | INTRAVENOUS | Status: DC
  Administered 2020-04-03 – 2020-04-07 (×8): 2 g via INTRAVENOUS
  Filled 2020-04-03 (×8): qty 20

## 2020-04-03 NOTE — Plan of Care (Signed)

## 2020-04-03 NOTE — Progress Notes (Addendum)
Enterococcal bactermia in setting of prev CNS bleed.  TTE with Ao Mv abnormalities. Pt, family do not want TEE  Would continue with ampicillin for 4 weeks .  Will add ceftriaxone 2g q12h as second agent, treating her as if she has endocarditis. Also for 4 weeks.   No Known Allergies  OPAT Orders Discharge antibiotics to be given via PICC line Discharge antibiotics: ceftriaxone 2g q12h ivp, ampicillin 2g q8h ivpb  Duration: 28 days End Date: 04-29-20  [facility] Care Per Protocol: please  Home health RN for IV administration and teaching; PICC line care and labs.    Labs weekly while on IV antibiotics: _x_ CBC with differential __ BMP _x_ CMP __ CRP __ ESR __ Vancomycin trough __ CK  x__ Please pull PIC at completion of IV antibiotics __ Please leave PIC in place until doctor has seen patient or been notified  Fax weekly labs to [phone]  Clinic Follow Up Appt: PCP

## 2020-04-03 NOTE — Progress Notes (Addendum)
PROGRESS NOTE   Amy Wiggins  KNL:[id number] DOB: 03-20-1931 DOA: 03/29/2020 PCP: Asencion Noble, MD   Chief Complaint  Patient presents with  . Fall   Level of care: Med-Surg  Brief Admission History:  87 y.o. female, with history of stroke, patent foreman ovale, paroxysmal atrial fibrillation, hypertension, hyperlipidemia, diabetes mellitus, depression, and more presents to the ED with a chief complaint of fall.  Patient reports that she was in the bathroom and he just stood up from the toilet when she fell.  She hit her head on the floor.  She did not lose consciousness.  She reports that before her fall she did not have any chest pain, palpitation, shortness of breath.  She reports that at this time she is having some blurry vision in her left eye.  Patient had her knees and hurt her left hip during the fall.  CT C-spine and head showed small amount of acute subarachnoid hemorrhage in setting of trauma, left frontal scalp and supra orbital swelling and soft tissue hematoma.  No acute fracture of C-spine, C-spine spondylitic changes chronic.  CT lumbar shows no acute fracture or static subluxation of lumbar spine.  Chest x-ray shows retrocardiac opacity that could be atelectasis versus infectious.  X-ray hip shows no acute findings.  Neurosurgery consulted from ED and reports patient can stay here for repeat CT which revealed stable bleed.    Assessment & Plan:   Principal Problem:   Subarachnoid hemorrhage (HCC) Active Problems:   Bacteremia due to Enterococcus   CKD (chronic kidney disease) stage 4, GFR 15-29 ml/min (HCC)   Type 2 diabetes mellitus with circulatory disorder (HCC)   HFpEF/Chronic diastolic congestive heart failure (HCC)   Atrial fibrillation, chronic (HCC)   Hypertension associated with stage 4 chronic kidney disease due to type 2 diabetes mellitus (HCC)   Hyperlipidemia associated with type 2 diabetes mellitus (HCC)   Gastroesophageal reflux disease   Chronic  constipation   Intracranial hemorrhage (HCC)   Gait instability   Fall   Fever  1. Sepsis secondary to enterococcal bacteremia - appreciate ID assistance, VRE ruled out.  Continue IV ampicillin.  Continue supportive measures.  TTE with no vegetations seen but valvular abnormalities.  TEE if family wants to pursue.  Goals of care discussions with family planned for 2/7 with palliative care service.  Repeated BC no growth to date.  Awaiting goals of care discussion later today.  2. Fall at home - Pt remains very unstable to ambulate, family declines SNF.  I would not restart anticoagulation at this time due to extremely high fall risk. 1 year mortality risk very high.  Requested palliative consult.  Started discussions with granddaughter.  3. Subarachnoid hemorrhage - small hemorrhage noted.  Repeated CT head after 12 hours with no change seen.  Neurosurgery follow up outpatient.  Repeated CT 2/6 with improvement of SAH.   4. Hypernatremia - poor oral intake - give hypotonic IV fluids,  Recheck in AM.  5. Chronic atrial fibrillation - rate controlled now, no anticoagulation due to intracranial hemorrhage.  6. Stage 4 CKD - stable, following and adjusting medications as needed for CKD.   7. Hyperlipidemia - stable. Resume home meds.   8. Type 2 DM with renal complications - slow dextrose infusion as she was having lows from not eating.  9. Hypoglycemia - resolved now.    10. DNR - present on admission.  With high 1 year mortality risk will ask for palliative consultation for goals of  care.    DVT prophylaxis:  SCDs Code Status:  DNR Family Communication: grandaughter 04/01/20, 04/02/20 Disposition: declines SNF, return home  Status is: Inpatient  Remains inpatient appropriate because:Inpatient level of care appropriate due to severity of illness  Dispo: The patient is from: Home              Anticipated d/c is to: TBD              Anticipated d/c date is: 1 day              Patient currently is  not medically stable to d/c.   Difficult to place patient No  Consultants:   PT   Procedures:   CT head x 2   Antimicrobials:    Subjective: Pt not eating and drinking well.  She will only take a few bites at a time.     Objective: Vitals:   04/03/20 0446 04/03/20 0751 04/03/20 0800 04/03/20 0952  BP: (!) 144/54   (!) 120/59  Pulse: 84   73  Resp: 20   18  Temp: 98.1 F (36.7 C)   98.1 F (36.7 C)  TempSrc: Oral   Oral  SpO2: 99% 98%  100%  Weight:      Height:   5\' 4"  (1.626 m)     Intake/Output Summary (Last 24 hours) at 04/03/2020 1242 Last data filed at 04/03/2020 0500 Gross per 24 hour  Intake 997.76 ml  Output 800 ml  Net 197.76 ml   Filed Weights   03/29/20 2144 03/30/20 1825  Weight: 86 kg 83.1 kg    Examination:  General exam: frail, elderly, chronically ill appearing female, Appears calm and comfortable  Respiratory system: BBS shallow, no rales heard. Respiratory effort normal. Cardiovascular system: irregularly irregular normal S1 & S2 heard. No JVD, murmurs, rubs, gallops or clicks. No pedal edema. Gastrointestinal system: Abdomen is nondistended, soft and nontender. No organomegaly or masses felt. Normal bowel sounds heard. Central nervous system: Alert and oriented. No focal neurological deficits. Extremities: Symmetric 5 x 5 power. Skin: No rashes, lesions or ulcers Psychiatry: Judgement and insight appear poor. Mood & affect appropriate.   Data Reviewed: I have personally reviewed following labs and imaging studies  CBC: Recent Labs  Lab 03/30/20 0053 03/31/20 0524 04/01/20 0926 04/02/20 0609 04/03/20 0632  WBC 18.9* 21.7* 16.2* 16.9* 16.5*  NEUTROABS 17.0*  --  13.1* 14.6* 14.6*  HGB 10.0* 7.9* 8.0* 8.4* 7.2*  HCT 33.5* 27.4* 27.3* 28.9* 25.8*  MCV 90.8 93.2 92.9 93.5 96.3  PLT 220 221 233 277 540    Basic Metabolic Panel: Recent Labs  Lab 03/30/20 0053 03/31/20 0524 04/01/20 0926 04/02/20 0609 04/03/20 0632  NA 127* 133*  135 139 152*  K 3.9 3.9 3.5 3.4* 3.0*  CL 89* 94* 99 103 115*  CO2 28 28 26 28 23   GLUCOSE 222* 154* 75 204* 170*  BUN 51* 65* 69* 65* 57*  CREATININE 2.65* 3.09* 3.16* 2.93* 2.33*  CALCIUM 8.9 9.0 8.8* 8.9 7.4*  MG  --  2.4  --   --   --     GFR: Estimated Creatinine Clearance: 18.1 mL/min (A) (by C-G formula based on SCr of 2.33 mg/dL (H)).  Liver Function Tests: Recent Labs  Lab 03/30/20 0053 03/31/20 0524 04/02/20 0609 04/03/20 0632  AST 57* 67* 48* 41  ALT 39 47* 41 33  ALKPHOS 175* 131* 128* 99  BILITOT 0.8 1.0 0.9 0.7  PROT 6.7 5.6*  6.0* <3.0*  ALBUMIN 2.5* 2.1* 2.3* 1.7*    CBG: Recent Labs  Lab 04/02/20 1106 04/02/20 1617 04/02/20 2130 04/03/20 0728 04/03/20 1115  GLUCAP 208* 182* 122* 188* 215*    Recent Results (from the past 240 hour(s))  SARS Coronavirus 2 by RT PCR (hospital order, performed in [facility] hospital lab) Nasopharyngeal Nasopharyngeal Swab     Status: None   Collection Time: 03/30/20 12:45 AM   Specimen: Nasopharyngeal Swab  Result Value Ref Range Status   SARS Coronavirus 2 NEGATIVE NEGATIVE Final    Comment: (NOTE) SARS-CoV-2 target nucleic acids are NOT DETECTED.  The SARS-CoV-2 RNA is generally detectable in upper and lower respiratory specimens during the acute phase of infection. The lowest concentration of SARS-CoV-2 viral copies this assay can detect is 250 copies / mL. A negative result does not preclude SARS-CoV-2 infection and should not be used as the sole basis for treatment or other patient management decisions.  A negative result may occur with improper specimen collection / handling, submission of specimen other than nasopharyngeal swab, presence of viral mutation(s) within the areas targeted by this assay, and inadequate number of viral copies (<250 copies / mL). A negative result must be combined with clinical observations, patient history, and epidemiological information.  Fact Sheet for Patients:    StrictlyIdeas.no  Fact Sheet for Healthcare Providers: BankingDealers.co.za  This test is not yet approved or  cleared by the [place] FDA and has been authorized for detection and/or diagnosis of SARS-CoV-2 by FDA under an Emergency Use Authorization (EUA).  This EUA will remain in effect (meaning this test can be used) for the duration of the COVID-19 declaration under Section 564(b)(1) of the Act, 21 U.S.C. section 360bbb-3(b)(1), unless the authorization is terminated or revoked sooner.  Performed at [facility], [address] [place], [place] [address]   Culture, blood (routine x 2)     Status: Abnormal   Collection Time: 03/30/20 12:45 AM   Specimen: BLOOD  Result Value Ref Range Status   Specimen Description   Final    BLOOD Performed at [facility], [address] [place], [place] [address]    Special Requests   Final    Normal Performed at [facility], [address], [place], [place] [address]    Culture  Setup Time   Final    GRAM POSITIVE COCCI IN BOTH AEROBIC AND ANAEROBIC BOTTLES Gram Stain Report Called to,Read Back By and Verified With: WHITE,M RN @1510  03/30/20 BY JONES,T Performed at [place] ID to follow CRITICAL RESULT CALLED TO, READ BACK BY AND VERIFIED WITH: Gavin Pound RN 2134 03/30/20 A BROWNING Performed at [facility], [place] [address], [place], [place] [address]    Culture ENTEROCOCCUS FAECALIS (A)  Final   Report Status 04/01/2020 FINAL  Final   Organism ID, Bacteria ENTEROCOCCUS FAECALIS  Final      Susceptibility   Enterococcus faecalis - MIC*    AMPICILLIN <=2 SENSITIVE Sensitive     VANCOMYCIN 1 SENSITIVE Sensitive     GENTAMICIN SYNERGY SENSITIVE Sensitive     * ENTEROCOCCUS FAECALIS  Blood Culture ID Panel (Reflexed)     Status: Abnormal   Collection Time: 03/30/20 12:45 AM  Result Value Ref Range Status   Enterococcus faecalis DETECTED (A) NOT DETECTED Final     Comment: CRITICAL RESULT CALLED TO, READ BACK BY AND VERIFIED WITH: A LEONARD RN 2134 03/30/20 A BROWNING    Enterococcus Faecium NOT DETECTED  NOT DETECTED Final   Listeria monocytogenes NOT DETECTED NOT DETECTED Final   Staphylococcus species NOT DETECTED NOT DETECTED Final   Staphylococcus aureus (BCID) NOT DETECTED NOT DETECTED Final   Staphylococcus epidermidis NOT DETECTED NOT DETECTED Final   Staphylococcus lugdunensis NOT DETECTED NOT DETECTED Final   Streptococcus species NOT DETECTED NOT DETECTED Final   Streptococcus agalactiae NOT DETECTED NOT DETECTED Final   Streptococcus pneumoniae NOT DETECTED NOT DETECTED Final   Streptococcus pyogenes NOT DETECTED NOT DETECTED Final   A.calcoaceticus-baumannii NOT DETECTED NOT DETECTED Final   Bacteroides fragilis NOT DETECTED NOT DETECTED Final   Enterobacterales NOT DETECTED NOT DETECTED Final   Enterobacter cloacae complex NOT DETECTED NOT DETECTED Final   Escherichia coli NOT DETECTED NOT DETECTED Final   Klebsiella aerogenes NOT DETECTED NOT DETECTED Final   Klebsiella oxytoca NOT DETECTED NOT DETECTED Final   Klebsiella pneumoniae NOT DETECTED NOT DETECTED Final   Proteus species NOT DETECTED NOT DETECTED Final   Salmonella species NOT DETECTED NOT DETECTED Final   Serratia marcescens NOT DETECTED NOT DETECTED Final   Haemophilus influenzae NOT DETECTED NOT DETECTED Final   Neisseria meningitidis NOT DETECTED NOT DETECTED Final   Pseudomonas aeruginosa NOT DETECTED NOT DETECTED Final   Stenotrophomonas maltophilia NOT DETECTED NOT DETECTED Final   Candida albicans NOT DETECTED NOT DETECTED Final   Candida auris NOT DETECTED NOT DETECTED Final   Candida glabrata NOT DETECTED NOT DETECTED Final   Candida krusei NOT DETECTED NOT DETECTED Final   Candida parapsilosis NOT DETECTED NOT DETECTED Final   Candida tropicalis NOT DETECTED NOT DETECTED Final   Cryptococcus neoformans/gattii NOT DETECTED NOT DETECTED Final   Vancomycin  resistance NOT DETECTED NOT DETECTED Final    Comment: Performed at [facility], [address], [place], [place] [address]  Culture, blood (routine x 2)     Status: Abnormal   Collection Time: 03/30/20  1:02 AM   Specimen: BLOOD  Result Value Ref Range Status   Specimen Description   Final    BLOOD LEFT ANTECUBITAL Performed at [facility], [address], [place], [place] [address]    Special Requests   Final    BOTTLES DRAWN AEROBIC AND ANAEROBIC Blood Culture adequate volume Performed at [facility], [address], [place], [place] [address]    Culture  Setup Time   Final    GRAM POSITIVE COCCI IN BOTH AEROBIC AND ANAEROBIC BOTTLES Gram Stain Report Called to,Read Back By and Verified With: WHITE,M RN @1510  03/30/20 BY JONES,T Performed at [place] TO, READ BACK BY AND VERIFIED WITH: A LEONARD RN 2134 03/30/20 A BROWNING    Culture (A)  Final    ENTEROCOCCUS FAECALIS SUSCEPTIBILITIES PERFORMED ON PREVIOUS CULTURE WITHIN THE LAST 5 DAYS. Performed at [facility], [place] [address] [place], [place] [address]    Report Status 04/01/2020 FINAL  Final  Urine culture     Status: None   Collection Time: 03/30/20  4:50 AM   Specimen: Urine, Catheterized  Result Value Ref Range Status   Specimen Description   Final    URINE, CATHETERIZED Performed at [facility], [address], [place], [place] [address]    Special Requests   Final    Normal Performed at [facility], [address] [place], [place] [address]    Culture   Final    NO GROWTH Performed at [facility], [place] [address], [place], [place] [address]  Report Status 03/31/2020 FINAL  Final  Culture, blood (Routine X 2) w Reflex to ID Panel     Status: None (Preliminary result)   Collection Time: 03/31/20  8:52 AM   Specimen: BLOOD  Result Value Ref Range Status   Specimen Description BLOOD LEFT ANTECUBITAL  Final   Special Requests    Final    BOTTLES DRAWN AEROBIC AND ANAEROBIC Blood Culture adequate volume   Culture   Final    NO GROWTH 3 DAYS Performed at [facility], [address] [place], [place] [address]    Report Status PENDING  Incomplete  Culture, blood (routine x 2)     Status: None (Preliminary result)   Collection Time: 04/01/20  4:51 PM   Specimen: BLOOD  Result Value Ref Range Status   Specimen Description BLOOD RIGHT ANTECUBITAL  Final   Special Requests   Final    BOTTLES DRAWN AEROBIC AND ANAEROBIC Blood Culture adequate volume   Culture   Final    NO GROWTH 2 DAYS Performed at [facility], [address] [place], [place] [address]    Report Status PENDING  Incomplete  Culture, blood (routine x 2)     Status: None (Preliminary result)   Collection Time: 04/01/20  4:59 PM   Specimen: BLOOD RIGHT HAND  Result Value Ref Range Status   Specimen Description BLOOD RIGHT HAND  Final   Special Requests   Final    BOTTLES DRAWN AEROBIC AND ANAEROBIC Blood Culture adequate volume   Culture   Final    NO GROWTH 2 DAYS Performed at [facility], [address], [place], [place] [address]    Report Status PENDING  Incomplete     Radiology Studies: CT HEAD WO CONTRAST  Result Date: 04/02/2020 CLINICAL DATA:  87 year old female with headache. Status post fall with trace subarachnoid hemorrhage. EXAM: CT HEAD WITHOUT CONTRAST TECHNIQUE: Contiguous axial images were obtained from the base of the skull through the vertex without intravenous contrast. COMPARISON:  Head CTs 03/30/2020 and earlier. FINDINGS: Brain: Partially resolved now subtle subarachnoid hemorrhage at the superior central sulcus (series 2, image 28). No intraventricular blood or ventriculomegaly. No new intracranial hemorrhage identified. No midline shift, mass effect, or evidence of intracranial mass lesion. Stable encephalomalacia right superior perirolandic cortex, bilateral occipital lobes, left thalamus, left cerebellum. No cortically  based acute infarct identified. Vascular: Calcified atherosclerosis at the skull base. No suspicious intracranial vascular hyperdensity. Skull: Stable.  Hyperostosis.  No skull fracture identified. Sinuses/Orbits: Increasing sphenoid and right frontal sinus mucosal thickening with small volume sinus fluid. Tympanic cavities and mastoids remain clear. Other: Calcified scalp vessel atherosclerosis. Small left vertex scalp hematoma. Orbits soft tissues are stable. IMPRESSION: 1. Decreasing subarachnoid hemorrhage at the left superior convexity with only subtle residual. 2. No new intracranial abnormality. Stable multifocal chronic infarcts. 3. Small left vertex scalp hematoma without underlying skull fracture. Electronically Signed   By: Genevie Ann M.D.   On: 04/02/2020 07:11   DG CHEST PORT 1 VIEW  Result Date: 04/03/2020 CLINICAL DATA:  Fall 03/29/2020. EXAM: PORTABLE CHEST 1 VIEW COMPARISON:  03/30/2020 FINDINGS: Interval development of small left effusion and left lower lobe atelectasis. No displaced rib fracture identified on the left. Heart size upper normal.  Negative for heart failure or edema. IMPRESSION: Small left effusion and left lower lobe airspace disease. No displaced rib fracture identified on the left. Electronically Signed   By: Franchot Gallo M.D.   On: 04/03/2020 11:29   Scheduled Meds: .  amiodarone  200 mg Oral Daily  . atorvastatin  20 mg Oral Daily  . Chlorhexidine Gluconate Cloth  6 each Topical Daily  . citalopram  20 mg Oral Daily  . diltiazem  120 mg Oral Daily  . insulin aspart  0-15 Units Subcutaneous TID WC  . insulin aspart  0-5 Units Subcutaneous QHS  . insulin detemir  8 Units Subcutaneous QHS  . ipratropium-albuterol  3 mL Nebulization TID  . mirtazapine  15 mg Oral QHS  . traZODone  75 mg Oral QHS   Continuous Infusions: . sodium chloride 1,000 mL (03/30/20 1606)  . sodium chloride 55 mL/hr at 04/03/20 1148  . ampicillin (OMNIPEN) IV 2 g (04/03/20 0601)     LOS:  4 days   Time spent: 35 mins   Elmyra Banwart Wynetta Emery, MD How to contact the [facility] Attending or Consulting provider [place] or covering provider during after hours [place], for this patient?  1. Check the care team in [facility] and look for a) attending/consulting [facility] provider listed and b) the [facility] team listed 2. Log into [email or web address] and use [place]'s universal password to access. If you do not have the password, please contact the hospital operator. 3. Locate the [facility] provider you are looking for under [facility] and page to a number that you can be directly reached. 4. If you still have difficulty reaching the provider, please page the [facility] (Director on Call) for the Hospitalists listed on amion for assistance.  04/03/2020, 12:42 PM

## 2020-04-03 NOTE — Progress Notes (Signed)
PHARMACY CONSULT NOTE FOR:  OUTPATIENT  PARENTERAL ANTIBIOTIC THERAPY (OPAT)  Indication: Endocarditis Regimen: ceftriaxone 2g IV q12h AND Ampicillin to be administered 2g IV q8h or as a 6g q24h continuous infusion   End date: 04/29/2020  IV antibiotic discharge orders are pended. To discharging provider:  please sign these orders via discharge navigator,  Select New Orders & click on the button choice - Manage This Unsigned Work.     Thank you for allowing pharmacy to be a part of this patient's care.  Phillis Haggis 04/03/2020, 3:38 PM

## 2020-04-03 NOTE — Consult Note (Signed)
Consultation Note Date: 04/03/2020   Patient Name: Amy Wiggins  DOB: 1931-08-05  MRN: [id number]  Age / Sex: 87 y.o., female  PCP: Asencion Noble, MD Referring Physician: Murlean Iba, MD  Reason for Consultation: Establishing goals of care and Psychosocial/spiritual support  HPI/Patient Profile: 87 y.o. female  with past medical history of TIAs/stroke x2, P A. fib on chronic anticoag, HTN/HLD, DM, patent foramen ovale, depression admitted on 03/29/2020 with bacteremia, fall with subarachnoid hemorrhage.   Clinical Assessment and Goals of Care: I have reviewed medical records including EPIC notes, labs and imaging, received report from attending, examined the patient. Amy Wiggins, Amy Wiggins, is lying quietly in bed. She is sleeping soundly, but wakes when I touch her arm. She is able to tell me her name, but not where we are. I am not sure that she can make her basic needs known. There is no family at bedside at this time. I asked if she would like something to drink, and she is able to drink a whole container of thickened cranberry juice without overt signs and symptoms of aspiration.  I return later in the day to meet with granddaughter/healthcare power of attorney, Amy Wiggins, to discuss diagnosis prognosis, [place], EOL wishes, disposition and options.  Amy Wiggins makes a FaceTime phone call with her cousin/co-HC POA, Amy Wiggins.  I introduced Palliative Medicine as specialized medical care for people living with serious illness. It focuses on providing relief from the symptoms and stress of a serious illness.   We discussed a brief life review of the patient.  Amy Wiggins is twice a widow, both of her adult children have died.  She has 2 granddaughters, Amy Wiggins and Amy Wiggins who are cousins.  She worked at [employer].   As far as functional and nutritional status, Amy Wiggins was somewhat independent with ADLs.   She had 24/7 paid caregivers prior to this illness.  We discussed her current illness and what it means in the larger context of her on-going co-morbidities.  Natural disease trajectory and expectations at EOL were discussed.  We talked about Amy Wiggins's chronic illness burden.  We also talked about her acute health concerns including, but not limited to sepsis, bacteremia, subdural hematoma, hypernatremia, poor by mouth intake.  I attempted to elicit values and goals of care important to the patient.  We talked about how to make choices for loved ones including 1) keeping her at the center of decision-making, not what we want for her 2) are we doing something for her or to her, can we change what is happening 3) the person she was 5 years ago, how would that [place] tell us to care for her now.  I share a diagram of the chronic illness pathway, what is normal and expected.  The difference between aggressive medical intervention and comfort care was considered in light of the patient's goals of care.  We talked about time for outcomes, and meaningful improvements.  I share my concern that if after a few more days she is  not showing more alertness, more by mouth intake, she may not have meaningful improvements.  Advanced directives, concepts specific to code status, artifical feeding and hydration, and rehospitalization were considered and discussed.  At this point, Amy Wiggins is DNR.    Questions and concerns were addressed.  The family was encouraged to call with questions or concerns.  PMT to follow-up 2/8.  Conference with attending, bedside nursing staff, transition of care team related to patient condition, needs, goals of care.   HCPOA   HCPOA -granddaughters Amy Wiggins and Amy Wiggins share healthcare power of attorney.  They work as a Therapist, occupational for decision-making    SUMMARY OF RECOMMENDATIONS   No TEE Continue to treat the treatable, time for outcomes We discuss hospice type care if no  improvements.   Code Status/Advance Care Planning:  DNR  Symptom Management:   Per hospitalist, no additional needs at this time.  Palliative Prophylaxis:   Frequent Pain Assessment, Oral Care and Turn Reposition  Additional Recommendations (Limitations, Scope, Preferences):  At this point continue to treat the treatable, time for outcomes.  Psycho-social/Spiritual:   Desire for further Chaplaincy support:no  Additional Recommendations: Caregiving  Support/Resources and Education on Hospice  Prognosis:   Unable to determine, based on outcomes. 6 to 12 months or less would not be surprising based on chronic illness burden, recurrent falls, history of stroke, now sepsis with bacteremia.  Discharge Planning: To be determined, based on outcomes. Would benefit from outpatient palliative services.      Primary Diagnoses: Present on Admission: . Bacteremia due to Enterococcus . Type 2 diabetes mellitus with circulatory disorder ([place]) . Gait instability . Hypertension associated with stage 4 chronic kidney disease due to type 2 diabetes mellitus ([place]) . Hyperlipidemia associated with type 2 diabetes mellitus ([place]) . HFpEF/Chronic diastolic congestive heart failure ([place]) . Gastroesophageal reflux disease . Chronic constipation . CKD (chronic kidney disease) stage 4, GFR 15-29 ml/min (H[facility]) . Atrial fibrillation, chronic ([place]) . Fever   I have reviewed the medical record, interviewed the patient and family, and examined the patient. The following aspects are pertinent.  Past Medical History:  Diagnosis Date  . Chronic anticoagulation   . Depression   . Diabetes mellitus   . Hyperlipidemia   . Hypertension   . PAF (paroxysmal atrial fibrillation) ([place])   . PFO (patent foramen ovale)   . Stroke ([place])    TIA's x 2   Social History   Socioeconomic History  . Marital status: Widowed    Spouse name: Not on file  . Number of children: 2  . Years of education: Not on  file  . Highest education level: Not on file  Occupational History  . Occupation: retired  Tobacco Use  . Smoking status: Former Smoker    Years: 10.00  . Smokeless tobacco: Never Used  . Tobacco comment: was an occasional smoker in the past  Vaping Use  . Vaping Use: Never used  Substance and Sexual Activity  . Alcohol use: No  . Drug use: No  . Sexual activity: Never  Other Topics Concern  . Not on file  Social History Narrative  . Not on file   Social Determinants of Health   Financial Resource Strain: Not on file  Food Insecurity: Not on file  Transportation Needs: No Transportation Needs  . Lack of Transportation (Medical): No  . Lack of Transportation (Non-Medical): No  Physical Activity: Not on file  Stress: Not on file  Social Connections: Not on file  Family History  Problem Relation Age of Onset  . Stroke Father   . Dementia Sister   . Atrial fibrillation Sister   . Parkinson's disease Sister   . Heart attack Daughter   . Diabetes Son   . Hypertension Son    Scheduled Meds: . amiodarone  200 mg Oral Daily  . atorvastatin  20 mg Oral Daily  . Chlorhexidine Gluconate Cloth  6 each Topical Daily  . citalopram  20 mg Oral Daily  . diltiazem  120 mg Oral Daily  . insulin aspart  0-15 Units Subcutaneous TID WC  . insulin aspart  0-5 Units Subcutaneous QHS  . insulin detemir  8 Units Subcutaneous QHS  . ipratropium-albuterol  3 mL Nebulization TID  . mirtazapine  15 mg Oral QHS  . traZODone  75 mg Oral QHS   Continuous Infusions: . sodium chloride 1,000 mL (03/30/20 1606)  . sodium chloride 55 mL/hr at 04/02/20 1722  . ampicillin (OMNIPEN) IV 2 g (04/03/20 0601)   PRN Meds:.sodium chloride, acetaminophen **OR** acetaminophen, ipratropium-albuterol, ondansetron **OR** ondansetron (ZOFRAN) IV, oxyCODONE Medications Prior to Admission:  Prior to Admission medications   Medication Sig Start Date End Date Taking? Authorizing Provider  acetaminophen  (TYLENOL) 500 MG tablet Take 1,000 mg by mouth at bedtime. may also take 2 tablets during the day as needed for pain 08/03/19  Yes [provider]  amiodarone (PACERONE) 200 MG tablet Take 1 tablet (200 mg total) by mouth daily. 08/26/19  Yes Gerlene Fee, NP  atorvastatin (LIPITOR) 20 MG tablet Take 20 mg by mouth daily. 12/03/19  Yes [provider]  buPROPion (WELLBUTRIN XL) 300 MG 24 hr tablet Take 1 tablet (300 mg total) by mouth daily. 08/26/19  Yes Gerlene Fee, NP  citalopram (CELEXA) 20 MG tablet Take 1 tablet (20 mg total) by mouth daily. 08/26/19  Yes Gerlene Fee, NP  diltiazem (CARDIZEM CD) 120 MG 24 hr capsule Take 1 capsule (120 mg total) by mouth daily. 08/26/19  Yes Gerlene Fee, NP  ELIQUIS 2.5 MG TABS tablet Take 1 tablet (2.5 mg total) by mouth 2 (two) times daily. 08/26/19  Yes Gerlene Fee, NP  insulin aspart (NOVOLOG FLEXPEN) 100 UNIT/ML FlexPen Inject 10 Units into the skin 3 (three) times daily with meals. insulin pen; 100 unit/mL (3 mL); amt: 10 units; subcutaneous Special Instructions: admin 5 units for CBG >150 Before Meals 08:00 AM, 11:30 AM, 05:00 PM 08/26/19  Yes Gerlene Fee, NP  insulin glargine (LANTUS) 100 UNIT/ML injection Inject 0.3 mLs (30 Units total) into the skin at bedtime. 08/26/19  Yes Gerlene Fee, NP  ipratropium-albuterol (DUONEB) 0.5-2.5 (3) MG/3ML SOLN Take 3 mLs by nebulization every 6 (six) hours. 08/26/19  Yes Gerlene Fee, NP  Magnesium Oxide 250 MG TABS Take 2 tablets (500 mg total) by mouth every evening. 08/26/19  Yes Gerlene Fee, NP  mirtazapine (REMERON) 15 MG tablet Take 15 mg by mouth at bedtime. 10/12/19  Yes [provider]  Multiple Vitamin (MULTIVITAMIN WITH MINERALS) TABS tablet Take 1 tablet by mouth at bedtime. For supplement   Yes [provider]  Multiple Vitamins-Minerals (PRESERVISION AREDS 2) CAPS Take 1 capsule by mouth 2 (two) times daily.   Yes [provider]  OXYGEN  Inhale 2 L into the lungs continuous. 08/03/19  Yes [provider]  pantoprazole (PROTONIX) 40 MG tablet Take 1 tablet (40 mg total) by mouth daily. For acid reflux/gerd 08/26/19  Yes Gerlene Fee, NP  potassium chloride SA (KLOR-CON) 20 MEQ tablet Take 1 tablet (20 mEq total) by mouth daily. 08/26/19  Yes Gerlene Fee, NP  DROPLET PEN NEEDLES 32G X 4 MM MISC  11/30/19   [provider]  Lancet Devices ([place] LANCING DEVICE) [place]  11/30/19   [provider]  torsemide (DEMADEX) 20 MG tablet Take 40mg  in am and 20mg  in the evening. You may take an extra tablet as needed Patient taking differently: Take 20-40 mg by mouth See admin instructions. Take 40mg  in am and 20mg  in the evening. You may take an extra tablet as needed 01/28/20   Shirley Friar, PA-C  traZODone (DESYREL) 150 MG tablet Take 0.5 tablets (75 mg total) by mouth at bedtime. 0.5 tablet to = 75 mg Patient not taking: Reported on 03/30/2020 08/26/19   Gerlene Fee, NP  TRUEplus Lancets 33G MISC  11/30/19   [provider]   No Known Allergies Review of Systems  Unable to perform ROS: Age    Physical Exam Vitals and nursing note reviewed.  Constitutional:      General: She is not in acute distress.    Appearance: She is ill-appearing.  HENT:     Head:     Comments: Left side of face with marked bruising and swelling Cardiovascular:     Rate and Rhythm: Normal rate.  Pulmonary:     Effort: Pulmonary effort is normal. No respiratory distress.  Abdominal:     General: There is no distension.     Tenderness: There is no guarding.  Musculoskeletal:        General: Swelling present.  Skin:    General: Skin is warm and dry.  Neurological:     Comments: Able to tell me her name only  Psychiatric:     Comments: Calm and cooperative, not fearful     Vital Signs: BP (!) 120/59 (BP Location: Right Arm)   Pulse 73   Temp 98.1 F (36.7 C) (Oral)   Resp 18   Ht 5\' 4"  (1.626 m)    Wt 83.1 kg   SpO2 100%   BMI 31.45 kg/m  Pain Scale: 0-10 POSS *See Group Information*: S-Acceptable,Sleep, easy to arouse Pain Score: 0-No pain   SpO2: SpO2: 100 % O2 Device:SpO2: 100 % O2 Flow Rate: .O2 Flow Rate (L/min): 3 L/min  IO: Intake/output summary:   Intake/Output Summary (Last 24 hours) at 04/03/2020 1110 Last data filed at 04/03/2020 0500 Gross per 24 hour  Intake 997.76 ml  Output 800 ml  Net 197.76 ml    LBM: Last BM Date: 03/29/20 Baseline Weight: Weight: 86 kg Most recent weight: Weight: 83.1 kg     Palliative Assessment/Data:   Flowsheet Rows   Flowsheet Row Most Recent Value  Intake Tab   Referral Department Hospitalist  Unit at Time of Referral Med/Surg Unit  Palliative Care Primary Diagnosis Trauma  Date Notified 03/31/20  Palliative Care Type Return patient Palliative Care  Reason for referral Clarify Goals of Care  Date of Admission 03/29/20  Date first seen by Palliative Care 04/03/20  # of days Palliative referral response time 3 Day(s)  # of days IP prior to Palliative referral 2  Clinical Assessment   Palliative Performance Scale Score 30%  Pain Max last 24 hours Not able to report  Pain Min Last 24 hours Not able to report  Dyspnea Max Last 24 Hours Not able to report  Dyspnea Min Last  24 hours Not able to report  Psychosocial & Spiritual Assessment   Palliative Care Outcomes       Time In: 1250 Time Out: 1410 Time Total: 80 minutes Greater than 50%  of this time was spent counseling and coordinating care related to the above assessment and plan.  Signed by: Drue Novel, NP   Please contact Palliative Medicine Team phone at [phone] for questions and concerns.  For individual provider: See Shea Evans

## 2020-04-04 ENCOUNTER — Inpatient Hospital Stay: Payer: Self-pay

## 2020-04-04 DIAGNOSIS — R509 Fever, unspecified: Secondary | ICD-10-CM | POA: Diagnosis not present

## 2020-04-04 DIAGNOSIS — I482 Chronic atrial fibrillation, unspecified: Secondary | ICD-10-CM | POA: Diagnosis not present

## 2020-04-04 DIAGNOSIS — Z515 Encounter for palliative care: Secondary | ICD-10-CM | POA: Diagnosis not present

## 2020-04-04 DIAGNOSIS — I609 Nontraumatic subarachnoid hemorrhage, unspecified: Secondary | ICD-10-CM | POA: Diagnosis not present

## 2020-04-04 DIAGNOSIS — R7881 Bacteremia: Secondary | ICD-10-CM | POA: Diagnosis not present

## 2020-04-04 DIAGNOSIS — Z7189 Other specified counseling: Secondary | ICD-10-CM | POA: Diagnosis not present

## 2020-04-04 LAB — CBC WITH DIFFERENTIAL/PLATELET
Abs Immature Granulocytes: 0.17 10*3/uL — ABNORMAL HIGH (ref 0.00–0.07)
Basophils Absolute: 0.1 10*3/uL (ref 0.0–0.1)
Basophils Relative: 0 %
Eosinophils Absolute: 0.4 10*3/uL (ref 0.0–0.5)
Eosinophils Relative: 3 %
HCT: 30 % — ABNORMAL LOW (ref 36.0–46.0)
Hemoglobin: 8.1 g/dL — ABNORMAL LOW (ref 12.0–15.0)
Immature Granulocytes: 1 %
Lymphocytes Relative: 5 %
Lymphs Abs: 0.8 10*3/uL (ref 0.7–4.0)
MCH: 27.1 pg (ref 26.0–34.0)
MCHC: 27 g/dL — ABNORMAL LOW (ref 30.0–36.0)
MCV: 100.3 fL — ABNORMAL HIGH (ref 80.0–100.0)
Monocytes Absolute: 1.2 10*3/uL — ABNORMAL HIGH (ref 0.1–1.0)
Monocytes Relative: 8 %
Neutro Abs: 13.4 10*3/uL — ABNORMAL HIGH (ref 1.7–7.7)
Neutrophils Relative %: 83 %
Platelets: 274 10*3/uL (ref 150–400)
RBC: 2.99 MIL/uL — ABNORMAL LOW (ref 3.87–5.11)
RDW: 16 % — ABNORMAL HIGH (ref 11.5–15.5)
WBC: 16 10*3/uL — ABNORMAL HIGH (ref 4.0–10.5)
nRBC: 0 % (ref 0.0–0.2)

## 2020-04-04 LAB — GLUCOSE, CAPILLARY
Glucose-Capillary: 186 mg/dL — ABNORMAL HIGH (ref 70–99)
Glucose-Capillary: 185 mg/dL — ABNORMAL HIGH (ref 70–99)
Glucose-Capillary: 158 mg/dL — ABNORMAL HIGH (ref 70–99)
Glucose-Capillary: 147 mg/dL — ABNORMAL HIGH (ref 70–99)

## 2020-04-04 LAB — COMPREHENSIVE METABOLIC PANEL
ALT: 44 U/L (ref 0–44)
AST: 54 U/L — ABNORMAL HIGH (ref 15–41)
Albumin: 2.2 g/dL — ABNORMAL LOW (ref 3.5–5.0)
Alkaline Phosphatase: 160 U/L — ABNORMAL HIGH (ref 38–126)
Anion gap: 6 (ref 5–15)
BUN: 65 mg/dL — ABNORMAL HIGH (ref 8–23)
CO2: 26 mmol/L (ref 22–32)
Calcium: 9.1 mg/dL (ref 8.9–10.3)
Chloride: 110 mmol/L (ref 98–111)
Creatinine, Ser: 3.07 mg/dL — ABNORMAL HIGH (ref 0.44–1.00)
GFR, Estimated: 14 mL/min — ABNORMAL LOW (ref >60)
Glucose, Bld: 209 mg/dL — ABNORMAL HIGH (ref 70–99)
Potassium: 3.8 mmol/L (ref 3.5–5.1)
Sodium: 142 mmol/L (ref 135–145)
Total Bilirubin: 0.7 mg/dL (ref 0.3–1.2)
Total Protein: 5.9 g/dL — ABNORMAL LOW (ref 6.5–8.1)

## 2020-04-04 LAB — MAGNESIUM: Magnesium: 2.7 mg/dL — ABNORMAL HIGH (ref 1.7–2.4)

## 2020-04-04 MED ORDER — SODIUM CHLORIDE 0.9% FLUSH: 10.0000 mL | INTRAVENOUS | Status: DC | PRN

## 2020-04-04 NOTE — TOC Progression Note (Signed)
Transition of Care [facility]) - Progression Note    Patient Details  Name: Amy Wiggins MRN: [id number] Date of Birth: [birth year]  Transition of Care [facility]) CM/SW Contact  Shade Flood, LCSW Phone Number: 04/04/2020, 2:55 PM  Clinical Narrative:     TOC following. Per MD, pt will likely be stable for dc home with IV anbx tomorrow. ID MD contacted Carolynn Sayers with [facility] to refer. Spoke with Pam today to update. Pam working on arrangements. TOC will follow up in AM.  Expected Discharge Plan: [place] Barriers to Discharge: Continued Medical Work up  Expected Discharge Plan and Services Expected Discharge Plan: [place] In-house Referral: Clinical Social Work   Post Acute Care Choice: [place] arrangements for the past 2 months: [place]: RN,PT [place]: [place] Date [place]: 03/30/20   Representative spoke with at [place]: [place] ([place]) Interventions    Readmission Risk Interventions Readmission Risk Prevention Plan 09/10/2019  Transportation Screening Complete  PCP or Specialist Appt within 3-5 Days Not Complete  Not Complete comments Granddaughter assists patient with scheduling appointments  [place] or [place] Complete  Social Work Consult for [place] Planning/Counseling Complete  Palliative Care Screening Not Applicable  Medication Review Press photographer) Complete  Some recent data might be hidden

## 2020-04-04 NOTE — Care Management Important Message (Signed)
Important Message  Patient Details  Name: Amy Wiggins MRN: [id number] Date of Birth: Dec 10, 1931   Medicare Important Message Given:  Yes     [name] 04/04/2020, 2:22 PM

## 2020-04-04 NOTE — Progress Notes (Signed)
PROGRESS NOTE   Amy Wiggins  ZOX:[id number] DOB: 12-29-1931 DOA: 03/29/2020 PCP: Asencion Noble, MD   Chief Complaint  Patient presents with  . Fall   Level of care: Med-Surg  Brief Admission History:  87 y.o. female, with history of stroke, patent foreman ovale, paroxysmal atrial fibrillation, hypertension, hyperlipidemia, diabetes mellitus, depression, and more presents to the ED with a chief complaint of fall.  Patient reports that she was in the bathroom and he just stood up from the toilet when she fell.  She hit her head on the floor.  She did not lose consciousness.  She reports that before her fall she did not have any chest pain, palpitation, shortness of breath.  She reports that at this time she is having some blurry vision in her left eye.  Patient had her knees and hurt her left hip during the fall.  CT C-spine and head showed small amount of acute subarachnoid hemorrhage in setting of trauma, left frontal scalp and supra orbital swelling and soft tissue hematoma.  No acute fracture of C-spine, C-spine spondylitic changes chronic.  CT lumbar shows no acute fracture or static subluxation of lumbar spine.  Chest x-ray shows retrocardiac opacity that could be atelectasis versus infectious.  X-ray hip shows no acute findings.  Neurosurgery consulted from ED and reports patient can stay here for repeat CT which revealed stable bleed.    Assessment & Plan:   Principal Problem:   Subarachnoid hemorrhage (HCC) Active Problems:   Bacteremia due to Enterococcus   CKD (chronic kidney disease) stage 4, GFR 15-29 ml/min (HCC)   Type 2 diabetes mellitus with circulatory disorder (HCC)   HFpEF/Chronic diastolic congestive heart failure (HCC)   Atrial fibrillation, chronic (HCC)   Hypertension associated with stage 4 chronic kidney disease due to type 2 diabetes mellitus (HCC)   Hyperlipidemia associated with type 2 diabetes mellitus (HCC)   Gastroesophageal reflux disease   Chronic  constipation   Intracranial hemorrhage (HCC)   Gait instability   Fall   Fever  1. Sepsis secondary to enterococcus faecalis bacteremia - appreciate ID assistance, VRE ruled out.  Continue IV ampicillin and ceftriaxone.  ID recommends 4 weeks of IV antibiotics via PICC line. Family declined TEE.  Continue supportive measures.  TTE with no vegetations seen but valvular abnormalities.  Goals of care discussions with family planned done on 2/7 with palliative care service.  Repeated BC no growth to date.  Ordered PICC line.  Family agreeable to home IV antibiotics.  2. Fall at home - Pt remains unstable to ambulate, family declines SNF repeatedly.  I would not restart anticoagulation at this time due to extremely high fall risk. 1 year mortality risk high.  Requested palliative consult.  Started discussions with granddaughter.  3. Subarachnoid hemorrhage - small hemorrhage noted.  Repeated CT head after 12 hours with no change seen.  Neurosurgery follow up outpatient.  Repeated CT 2/6 with improvement of SAH.   4. Hypernatremia - poor oral intake - give hypotonic IV fluids,  Resolved with hydration.  5. Chronic atrial fibrillation - rate controlled now, no anticoagulation due to intracranial hemorrhage and high fall risk.   6. Stage 4 CKD - stable, following and adjusting medications as needed for CKD.  Not a candidate for HD.    7. Hyperlipidemia - stable. Resumed home meds.   8. Type 2 DM with renal complications - monitoring for any signs of further hypoglycemia, reduced insulin doses accordingly.  9. Hypoglycemia - resolved  now.    10. DNR - present on admission.  With high 1 year mortality risk will ask for palliative consultation for goals of care.    DVT prophylaxis:  SCDs Code Status:  DNR Family Communication: grandaughter 04/01/20, 04/02/20, 04/04/20 Disposition: declines SNF, return home  Status is: Inpatient  Remains inpatient appropriate because:Inpatient level of care appropriate due to  severity of illness  Dispo: The patient is from: Home              Anticipated d/c is to: Home with [facility]              Anticipated d/c date is: 1 day              Patient currently is not medically stable to d/c.   Difficult to place patient No  Consultants:   PT   Procedures:   CT head x 2   Antimicrobials:    Subjective: Pt eating and drinking a little better today.     Objective: Vitals:   04/04/20 0500 04/04/20 0819 04/04/20 1422 04/04/20 1432  BP: (!) 114/48  (!) 123/47   Pulse: 74  71   Resp: 20  18   Temp: 98.3 F (36.8 C)  (!) 97.5 F (36.4 C)   TempSrc: Oral  Oral   SpO2: 100% 100% 99% 96%  Weight:      Height:        Intake/Output Summary (Last 24 hours) at 04/04/2020 1505 Last data filed at 04/04/2020 1421 Gross per 24 hour  Intake 1730 ml  Output 550 ml  Net 1180 ml   Filed Weights   03/29/20 2144 03/30/20 1825  Weight: 86 kg 83.1 kg    Examination:  General exam: frail, elderly, chronically ill appearing female, Appears calm and comfortable  Respiratory system: BBS shallow, no rales heard. Respiratory effort normal. Cardiovascular system: irregularly irregular normal S1 & S2 heard. No JVD, murmurs, rubs, gallops or clicks. No pedal edema. Gastrointestinal system: Abdomen is nondistended, soft and nontender. No organomegaly or masses felt. Normal bowel sounds heard. Central nervous system: Alert and oriented. No focal neurological deficits. Extremities: Symmetric 5 x 5 power. Skin: No rashes, lesions or ulcers Psychiatry: Judgement and insight appear poor. Mood & affect appropriate.   Data Reviewed: I have personally reviewed following labs and imaging studies  CBC: Recent Labs  Lab 03/30/20 0053 03/31/20 0524 04/01/20 0926 04/02/20 0609 04/03/20 0632 04/04/20 0734  WBC 18.9* 21.7* 16.2* 16.9* 16.5* 16.0*  NEUTROABS 17.0*  --  13.1* 14.6* 14.6* 13.4*  HGB 10.0* 7.9* 8.0* 8.4* 7.2* 8.1*  HCT 33.5* 27.4* 27.3* 28.9* 25.8* 30.0*  MCV 90.8  93.2 92.9 93.5 96.3 100.3*  PLT 220 221 233 277 252 626    Basic Metabolic Panel: Recent Labs  Lab 03/31/20 0524 04/01/20 0926 04/02/20 0609 04/03/20 0632 04/04/20 0734  NA 133* 135 139 152* 142  K 3.9 3.5 3.4* 3.0* 3.8  CL 94* 99 103 115* 110  CO2 28 26 28 23 26   GLUCOSE 154* 75 204* 170* 209*  BUN 65* 69* 65* 57* 65*  CREATININE 3.09* 3.16* 2.93* 2.33* 3.07*  CALCIUM 9.0 8.8* 8.9 7.4* 9.1  MG 2.4  --   --   --  2.7*    GFR: Estimated Creatinine Clearance: 13.7 mL/min (A) (by C-G formula based on SCr of 3.07 mg/dL (H)).  Liver Function Tests: Recent Labs  Lab 03/30/20 0053 03/31/20 0524 04/02/20 0609 04/03/20 0632 04/04/20 0734  AST 57*  67* 48* 41 54*  ALT 39 47* 41 33 44  ALKPHOS 175* 131* 128* 99 160*  BILITOT 0.8 1.0 0.9 0.7 0.7  PROT 6.7 5.6* 6.0* <3.0* 5.9*  ALBUMIN 2.5* 2.1* 2.3* 1.7* 2.2*    CBG: Recent Labs  Lab 04/03/20 1115 04/03/20 1625 04/03/20 1948 04/04/20 0727 04/04/20 1113  GLUCAP 215* 160* 151* 186* 185*    Recent Results (from the past 240 hour(s))  SARS Coronavirus 2 by RT PCR (hospital order, performed in [facility] hospital lab) Nasopharyngeal Nasopharyngeal Swab     Status: None   Collection Time: 03/30/20 12:45 AM   Specimen: Nasopharyngeal Swab  Result Value Ref Range Status   SARS Coronavirus 2 NEGATIVE NEGATIVE Final    Comment: (NOTE) SARS-CoV-2 target nucleic acids are NOT DETECTED.  The SARS-CoV-2 RNA is generally detectable in upper and lower respiratory specimens during the acute phase of infection. The lowest concentration of SARS-CoV-2 viral copies this assay can detect is 250 copies / mL. A negative result does not preclude SARS-CoV-2 infection and should not be used as the sole basis for treatment or other patient management decisions.  A negative result may occur with improper specimen collection / handling, submission of specimen other than nasopharyngeal swab, presence of viral mutation(s) within the areas  targeted by this assay, and inadequate number of viral copies (<250 copies / mL). A negative result must be combined with clinical observations, patient history, and epidemiological information.  Fact Sheet for Patients:   StrictlyIdeas.no  Fact Sheet for Healthcare Providers: BankingDealers.co.za  This test is not yet approved or  cleared by the [place] FDA and has been authorized for detection and/or diagnosis of SARS-CoV-2 by FDA under an Emergency Use Authorization (EUA).  This EUA will remain in effect (meaning this test can be used) for the duration of the COVID-19 declaration under Section 564(b)(1) of the Act, 21 U.S.C. section 360bbb-3(b)(1), unless the authorization is terminated or revoked sooner.  Performed at [facility], [address], [place], [place] [address]   Culture, blood (routine x 2)     Status: Abnormal   Collection Time: 03/30/20 12:45 AM   Specimen: BLOOD  Result Value Ref Range Status   Specimen Description   Final    BLOOD Performed at [facility], [address], [place], [place] [address]    Special Requests   Final    Normal Performed at [facility], [address] [place], [place] [address]    Culture  Setup Time   Final    GRAM POSITIVE COCCI IN BOTH AEROBIC AND ANAEROBIC BOTTLES Gram Stain Report Called to,Read Back By and Verified With: WHITE,M RN @1510  03/30/20 BY JONES,T Performed at [place] ID to follow CRITICAL RESULT CALLED TO, READ BACK BY AND VERIFIED WITH: Gavin Pound RN 2134 03/30/20 A BROWNING Performed at [facility], [place] [address], [place], [place] [address]    Culture ENTEROCOCCUS FAECALIS (A)  Final   Report Status 04/01/2020 FINAL  Final   Organism ID, Bacteria ENTEROCOCCUS FAECALIS  Final      Susceptibility   Enterococcus faecalis - MIC*    AMPICILLIN <=2 SENSITIVE Sensitive     VANCOMYCIN 1 SENSITIVE Sensitive     GENTAMICIN SYNERGY  SENSITIVE Sensitive     * ENTEROCOCCUS FAECALIS  Blood Culture ID Panel (Reflexed)     Status: Abnormal   Collection Time: 03/30/20 12:45 AM  Result Value Ref Range Status   Enterococcus faecalis DETECTED (A)  NOT DETECTED Final    Comment: CRITICAL RESULT CALLED TO, READ BACK BY AND VERIFIED WITH: A LEONARD RN 2134 03/30/20 A BROWNING    Enterococcus Faecium NOT DETECTED NOT DETECTED Final   Listeria monocytogenes NOT DETECTED NOT DETECTED Final   Staphylococcus species NOT DETECTED NOT DETECTED Final   Staphylococcus aureus (BCID) NOT DETECTED NOT DETECTED Final   Staphylococcus epidermidis NOT DETECTED NOT DETECTED Final   Staphylococcus lugdunensis NOT DETECTED NOT DETECTED Final   Streptococcus species NOT DETECTED NOT DETECTED Final   Streptococcus agalactiae NOT DETECTED NOT DETECTED Final   Streptococcus pneumoniae NOT DETECTED NOT DETECTED Final   Streptococcus pyogenes NOT DETECTED NOT DETECTED Final   A.calcoaceticus-baumannii NOT DETECTED NOT DETECTED Final   Bacteroides fragilis NOT DETECTED NOT DETECTED Final   Enterobacterales NOT DETECTED NOT DETECTED Final   Enterobacter cloacae complex NOT DETECTED NOT DETECTED Final   Escherichia coli NOT DETECTED NOT DETECTED Final   Klebsiella aerogenes NOT DETECTED NOT DETECTED Final   Klebsiella oxytoca NOT DETECTED NOT DETECTED Final   Klebsiella pneumoniae NOT DETECTED NOT DETECTED Final   Proteus species NOT DETECTED NOT DETECTED Final   Salmonella species NOT DETECTED NOT DETECTED Final   Serratia marcescens NOT DETECTED NOT DETECTED Final   Haemophilus influenzae NOT DETECTED NOT DETECTED Final   Neisseria meningitidis NOT DETECTED NOT DETECTED Final   Pseudomonas aeruginosa NOT DETECTED NOT DETECTED Final   Stenotrophomonas maltophilia NOT DETECTED NOT DETECTED Final   Candida albicans NOT DETECTED NOT DETECTED Final   Candida auris NOT DETECTED NOT DETECTED Final   Candida glabrata NOT DETECTED NOT DETECTED Final    Candida krusei NOT DETECTED NOT DETECTED Final   Candida parapsilosis NOT DETECTED NOT DETECTED Final   Candida tropicalis NOT DETECTED NOT DETECTED Final   Cryptococcus neoformans/gattii NOT DETECTED NOT DETECTED Final   Vancomycin resistance NOT DETECTED NOT DETECTED Final    Comment: Performed at [facility], [address], [place], [place] [address]  Culture, blood (routine x 2)     Status: Abnormal   Collection Time: 03/30/20  1:02 AM   Specimen: BLOOD  Result Value Ref Range Status   Specimen Description   Final    BLOOD LEFT ANTECUBITAL Performed at [facility], [address], [place], [place] [address]    Special Requests   Final    BOTTLES DRAWN AEROBIC AND ANAEROBIC Blood Culture adequate volume Performed at [facility], [address], [place], [place] [address]    Culture  Setup Time   Final    GRAM POSITIVE COCCI IN BOTH AEROBIC AND ANAEROBIC BOTTLES Gram Stain Report Called to,Read Back By and Verified With: WHITE,M RN @1510  03/30/20 BY JONES,T Performed at [facility] TO, READ BACK BY AND VERIFIED WITH: A LEONARD RN 2134 03/30/20 A BROWNING    Culture (A)  Final    ENTEROCOCCUS FAECALIS SUSCEPTIBILITIES PERFORMED ON PREVIOUS CULTURE WITHIN THE LAST 5 DAYS. Performed at [facility], [place] [address] [place], [place] [address]    Report Status 04/01/2020 FINAL  Final  Urine culture     Status: None   Collection Time: 03/30/20  4:50 AM   Specimen: Urine, Catheterized  Result Value Ref Range Status   Specimen Description   Final    URINE, CATHETERIZED Performed at [facility], [address] [place], [place] [address]    Special Requests   Final    Normal Performed at [facility],  [address] [place], [place] [address]    Culture   Final    NO GROWTH Performed at [facility], [place] [address], [place], [place] [address]    Report Status 03/31/2020 FINAL  Final  Culture, blood  (Routine X 2) w Reflex to ID Panel     Status: None (Preliminary result)   Collection Time: 03/31/20  8:52 AM   Specimen: BLOOD  Result Value Ref Range Status   Specimen Description BLOOD LEFT ANTECUBITAL  Final   Special Requests   Final    BOTTLES DRAWN AEROBIC AND ANAEROBIC Blood Culture adequate volume   Culture   Final    NO GROWTH 4 DAYS Performed at [facility], [address] [place], [place] [address]    Report Status PENDING  Incomplete  Culture, blood (routine x 2)     Status: None (Preliminary result)   Collection Time: 04/01/20  4:51 PM   Specimen: BLOOD  Result Value Ref Range Status   Specimen Description BLOOD RIGHT ANTECUBITAL  Final   Special Requests   Final    BOTTLES DRAWN AEROBIC AND ANAEROBIC Blood Culture adequate volume   Culture   Final    NO GROWTH 3 DAYS Performed at [facility], [address] [place], [place] [address]    Report Status PENDING  Incomplete  Culture, blood (routine x 2)     Status: None (Preliminary result)   Collection Time: 04/01/20  4:59 PM   Specimen: BLOOD RIGHT HAND  Result Value Ref Range Status   Specimen Description BLOOD RIGHT HAND  Final   Special Requests   Final    BOTTLES DRAWN AEROBIC AND ANAEROBIC Blood Culture adequate volume   Culture   Final    NO GROWTH 3 DAYS Performed at [facility], [address], [place],  [address]    Report Status PENDING  Incomplete     Radiology Studies: DG CHEST PORT 1 VIEW  Result Date: 04/03/2020 CLINICAL DATA:  Fall 03/29/2020. EXAM: PORTABLE CHEST 1 VIEW COMPARISON:  03/30/2020 FINDINGS: Interval development of small left effusion and left lower lobe atelectasis. No displaced rib fracture identified on the left. Heart size upper normal.  Negative for heart failure or edema. IMPRESSION: Small left effusion and left lower lobe airspace disease. No displaced rib fracture identified on the left. Electronically Signed   By: Franchot Gallo M.D.   On: 04/03/2020 11:29   [place] EKG  SITE RITE  Result Date: 04/04/2020 If Site Rite image not attached, placement could not be confirmed due to current cardiac rhythm.  Scheduled Meds: . amiodarone  200 mg Oral Daily  . atorvastatin  20 mg Oral Daily  . Chlorhexidine Gluconate Cloth  6 each Topical Daily  . citalopram  20 mg Oral Daily  . diltiazem  120 mg Oral Daily  . insulin aspart  0-15 Units Subcutaneous TID WC  . insulin aspart  0-5 Units Subcutaneous QHS  . insulin detemir  8 Units Subcutaneous QHS  . ipratropium-albuterol  3 mL Nebulization TID  . mirtazapine  15 mg Oral QHS  . traZODone  75 mg Oral QHS   Continuous Infusions: . 0.45 % NaCl with KCl 20 mEq / L 70 mL/hr at 04/03/20 1407  . sodium chloride 1,000 mL (03/30/20 1606)  . ampicillin (OMNIPEN) IV 2 g (04/04/20 1252)  . cefTRIAXone (ROCEPHIN)  IV 2 g (04/04/20 0917)     LOS: 5 days   Time spent: 35 mins   Matilde Markie  Wynetta Emery, MD How to contact the [facility] Attending or Consulting provider [place] or covering provider during after hours [place], for this patient?  1. Check the care team in [facility] and look for a) attending/consulting TRH provider listed and b) the [facility] team listed 2. Log into [email or web address] and use [place]'s universal password to access. If you do not have the password, please contact the hospital operator. 3. Locate the [facility] provider you are looking for under [facility] and page to a number that you can be directly reached. 4. If you still have difficulty reaching the provider, please page the [facility] (Director on Call) for the Hospitalists listed on amion for assistance.  04/04/2020, 3:05 PM

## 2020-04-04 NOTE — Progress Notes (Signed)
Palliative: Mrs. Patty is sitting up quietly in bed.  She appears acutely ill and somewhat frail.  She is resting comfortably, but wakes easily when I call her name.  She will briefly make eye contact.  She is oriented to person and place today, I do not ask time.  I believe she is able to make her basic needs known.  There is no family at bedside at this time.  She is able to take some sips of juice without overt signs and symptoms of aspiration, but declines food.  Call to granddaughter/HC POA, Raquel Sarna.  Left somewhat detailed voice message related to Mrs. Carusone's improvement in cognition, PT evaluation recommending short-term rehab.  Mrs. Pflum has 24/7 at home paid caregivers so family may elect home with home health.  Conference with attending, bedside nursing staff, transition of care team related to patient condition, needs, goals of care. PMT to follow on 2/10  Plan: Continue to treat the treatable but no CPR or intubation.  Time for outcomes.  Short-term rehab versus home with home health  25 minutes Quinn Axe, NP Palliative medicine team Team phone [phone] Greater than 50% of this time was spent counseling and coordinating care related to the above assessment and plan.

## 2020-04-04 NOTE — Progress Notes (Signed)
Physical Therapy Treatment Patient Details Name: Amy Wiggins MRN: [id number] DOB: September 14, 1931 Today's Date: 04/04/2020    History of Present Illness Amy Wiggins  is a 87 y.o. female, with history of stroke, patent foreman ovale, paroxysmal atrial fibrillation, hypertension, hyperlipidemia, diabetes mellitus, depression, and more presents to the ED with a chief complaint of fall.  Patient reports that she was in the bathroom and he just stood up from the toilet when she fell.  She hit her head on the floor.  She did not lose consciousness.  She reports that before her fall she did not have any chest pain, palpitation, shortness of breath.  She reports that at this time she is having some blurry vision in her left eye.  Patient had her knees and hurt her left hip during the fall.  Caregiver at bedside is not able to offer any more history, and patient is a terrible historian.    PT Comments    Pt lethargic and flat upon arrival, but agreeable to therapy and requests to transfer to chair due to back pain in bed. Pt requires max A to come to sitting EOB, within ~30 seconds pt reports dizziness so assisted back to supine. Pt with back pain that increased with movement, so repositioned in bed via total A using bed pad. Once pt comfortable, reports dizziness resolved. Pt on 3L O2 with SpO2 97%. Pt limited by pain and lethargy this session. Nurse administered pain medication during treatment. Nurse notified of session and IV beeping at [place].    Follow Up Recommendations  SNF;Supervision for mobility/OOB;Supervision/Assistance - 24 hour     Equipment Recommendations  None recommended by PT    Recommendations for Other Services       Precautions / Restrictions Precautions Precautions: Fall Restrictions Weight Bearing Restrictions: No    Mobility  Bed Mobility Overal bed mobility: Needs Assistance Bed Mobility: Supine to Sit;Sit to Supine  Supine to sit: Max assist;HOB elevated Sit to  supine: Max assist   General bed mobility comments: max A to bring BLE over to EOB and sit HOB up and to return to supine, total A to scoot up in bed using bedpad with boost assist  Transfers  General transfer comment: unable, dizziness upon sitting EOB and back pain so pt requests to return to supine  Ambulation/Gait  General Gait Details: unable, dizziness upon sitting EOB and back pain so pt requests to return to supine   Stairs             Wheelchair Mobility    Modified Rankin (Stroke Patients Only)       Balance Overall balance assessment: Needs assistance Sitting-balance support: Feet supported;Bilateral upper extremity supported Sitting balance-Leahy Scale: Fair Sitting balance - Comments: seated EOB       Cognition Arousal/Alertness: Lethargic Behavior During Therapy: Flat affect Overall Cognitive Status: No family/caregiver present to determine baseline cognitive functioning    General Comments: Pt appears lethargic and flat during session, motivated to get out of bed to chair. Pt follows commands appropriatley, though closes eyes when not cued to open.      Exercises      General Comments General comments (skin integrity, edema, etc.): Pt on 3L O2 with SpO2 97%      Pertinent Vitals/Pain Pain Assessment: Faces Faces Pain Scale: Hurts whole lot Pain Location: low back Pain Descriptors / Indicators: Grimacing;Moaning Pain Intervention(s): Limited activity within patient's tolerance;Monitored during session;Repositioned;RN gave pain meds during session    Home Living  Prior Function            PT Goals (current goals can now be found in the care plan section) Acute Rehab PT Goals Patient Stated Goal: return home PT Goal Formulation: With patient Time For Goal Achievement: 04/13/20 Potential to Achieve Goals: Fair Progress towards PT goals: Progressing toward goals (limited by pain and lethargy)     Frequency    Min 3X/week      PT Plan Current plan remains appropriate    Co-evaluation              AM-PAC PT "6 Clicks" Mobility   Outcome Measure  Help needed turning from your back to your side while in a flat bed without using bedrails?: A Lot Help needed moving from lying on your back to sitting on the side of a flat bed without using bedrails?: A Lot Help needed moving to and from a bed to a chair (including a wheelchair)?: A Lot Help needed standing up from a chair using your arms (e.g., wheelchair or bedside chair)?: A Lot Help needed to walk in hospital room?: Total Help needed climbing 3-5 steps with a railing? : Total 6 Click Score: 10    End of Session Equipment Utilized During Treatment: Oxygen Activity Tolerance: Patient limited by fatigue;Patient limited by lethargy Patient left: in bed;with call bell/phone within reach;with bed alarm set Nurse Communication: Mobility status;Patient requests pain meds;Other (comment) (IV beeping) PT Visit Diagnosis: Unsteadiness on feet (R26.81);Other abnormalities of gait and mobility (R26.89);Muscle weakness (generalized) (M62.81);History of falling (Z91.81)     Time: 1349-1411 PT Time Calculation (min) (ACUTE ONLY): 22 min  Charges:  $Therapeutic Activity: 8-22 mins                      Tori Rozalia Dino PT, DPT 04/04/20, 2:23 PM

## 2020-04-04 NOTE — Progress Notes (Signed)
Peripherally Inserted Central Catheter Placement  The IV Nurse has discussed with the patient and/or persons authorized to consent for the patient, the purpose of this procedure and the potential benefits and risks involved with this procedure.  The benefits include less needle sticks, lab draws from the catheter, and the patient may be discharged home with the catheter. Risks include, but not limited to, infection, bleeding, blood clot (thrombus formation), and puncture of an artery; nerve damage and irregular heartbeat and possibility to perform a PICC exchange if needed/ordered by physician.  Alternatives to this procedure were also discussed.  Bard Power PICC patient education guide, fact sheet on infection prevention and patient information card has been provided to patient /or left at bedside.    PICC Placement Documentation  PICC Single Lumen 04/88/89 PICC Right Basilic 39 cm 1 cm (Active)  Indication for Insertion or Continuance of Line Prolonged intravenous therapies 04/04/20 1808  Exposed Catheter (cm) 1 cm 04/04/20 1808  Site Assessment Clean;Dry;Intact 04/04/20 1808  Line Status Flushed;Blood return noted;Saline locked 04/04/20 1808  Dressing Type Transparent 04/04/20 1808  Dressing Status Clean;Dry;Intact 04/04/20 1808  Antimicrobial disc in place? Yes 04/04/20 1808  Dressing Change Due 04/11/20 04/04/20 1808       Scotty Court 04/04/2020, 6:11 PM

## 2020-04-05 DIAGNOSIS — A4181 Sepsis due to Enterococcus: Principal | ICD-10-CM

## 2020-04-05 DIAGNOSIS — I482 Chronic atrial fibrillation, unspecified: Secondary | ICD-10-CM | POA: Diagnosis not present

## 2020-04-05 DIAGNOSIS — I5032 Chronic diastolic (congestive) heart failure: Secondary | ICD-10-CM | POA: Diagnosis not present

## 2020-04-05 DIAGNOSIS — R7881 Bacteremia: Secondary | ICD-10-CM | POA: Diagnosis not present

## 2020-04-05 DIAGNOSIS — N184 Chronic kidney disease, stage 4 (severe): Secondary | ICD-10-CM

## 2020-04-05 DIAGNOSIS — I609 Nontraumatic subarachnoid hemorrhage, unspecified: Secondary | ICD-10-CM | POA: Diagnosis not present

## 2020-04-05 LAB — COMPREHENSIVE METABOLIC PANEL
ALT: 43 U/L (ref 0–44)
AST: 49 U/L — ABNORMAL HIGH (ref 15–41)
Albumin: 2.2 g/dL — ABNORMAL LOW (ref 3.5–5.0)
Alkaline Phosphatase: 174 U/L — ABNORMAL HIGH (ref 38–126)
Anion gap: 9 (ref 5–15)
BUN: 64 mg/dL — ABNORMAL HIGH (ref 8–23)
CO2: 25 mmol/L (ref 22–32)
Calcium: 9.5 mg/dL (ref 8.9–10.3)
Chloride: 109 mmol/L (ref 98–111)
Creatinine, Ser: 3 mg/dL — ABNORMAL HIGH (ref 0.44–1.00)
GFR, Estimated: 15 mL/min — ABNORMAL LOW (ref >60)
Glucose, Bld: 161 mg/dL — ABNORMAL HIGH (ref 70–99)
Potassium: 4.2 mmol/L (ref 3.5–5.1)
Sodium: 143 mmol/L (ref 135–145)
Total Bilirubin: 0.6 mg/dL (ref 0.3–1.2)
Total Protein: 6.1 g/dL — ABNORMAL LOW (ref 6.5–8.1)

## 2020-04-05 LAB — CULTURE, BLOOD (ROUTINE X 2)
Culture: NO GROWTH
Special Requests: ADEQUATE

## 2020-04-05 LAB — CBC WITH DIFFERENTIAL/PLATELET
Abs Immature Granulocytes: 0.17 10*3/uL — ABNORMAL HIGH (ref 0.00–0.07)
Basophils Absolute: 0 10*3/uL (ref 0.0–0.1)
Basophils Relative: 0 %
Eosinophils Absolute: 0.2 10*3/uL (ref 0.0–0.5)
Eosinophils Relative: 1 %
HCT: 31.3 % — ABNORMAL LOW (ref 36.0–46.0)
Hemoglobin: 8.2 g/dL — ABNORMAL LOW (ref 12.0–15.0)
Immature Granulocytes: 1 %
Lymphocytes Relative: 5 %
Lymphs Abs: 0.8 10*3/uL (ref 0.7–4.0)
MCH: 26.5 pg (ref 26.0–34.0)
MCHC: 26.2 g/dL — ABNORMAL LOW (ref 30.0–36.0)
MCV: 101.3 fL — ABNORMAL HIGH (ref 80.0–100.0)
Monocytes Absolute: 1 10*3/uL (ref 0.1–1.0)
Monocytes Relative: 6 %
Neutro Abs: 13.5 10*3/uL — ABNORMAL HIGH (ref 1.7–7.7)
Neutrophils Relative %: 87 %
Platelets: 236 10*3/uL (ref 150–400)
RBC: 3.09 MIL/uL — ABNORMAL LOW (ref 3.87–5.11)
RDW: 16.3 % — ABNORMAL HIGH (ref 11.5–15.5)
WBC: 15.6 10*3/uL — ABNORMAL HIGH (ref 4.0–10.5)
nRBC: 0 % (ref 0.0–0.2)

## 2020-04-05 LAB — GLUCOSE, CAPILLARY
Glucose-Capillary: 148 mg/dL — ABNORMAL HIGH (ref 70–99)
Glucose-Capillary: 204 mg/dL — ABNORMAL HIGH (ref 70–99)
Glucose-Capillary: 149 mg/dL — ABNORMAL HIGH (ref 70–99)
Glucose-Capillary: 160 mg/dL — ABNORMAL HIGH (ref 70–99)
Glucose-Capillary: 220 mg/dL — ABNORMAL HIGH (ref 70–99)

## 2020-04-05 NOTE — Progress Notes (Signed)
Physical Therapy Treatment Patient Details Name: Amy Wiggins MRN: [id number] DOB: Aug 24, 1931 Today's Date: 04/05/2020    History of Present Illness Amy Wiggins  is a 87 y.o. female, with history of stroke, patent foreman ovale, paroxysmal atrial fibrillation, hypertension, hyperlipidemia, diabetes mellitus, depression, and more presents to the ED with a chief complaint of fall.  Patient reports that she was in the bathroom and he just stood up from the toilet when she fell.  She hit her head on the floor.  She did not lose consciousness.  She reports that before her fall she did not have any chest pain, palpitation, shortness of breath.  She reports that at this time she is having some blurry vision in her left eye.  Patient had her knees and hurt her left hip during the fall.  Caregiver at bedside is not able to offer any more history, and patient is a terrible historian.    PT Comments    Pt eager to get out of bed to chair due to being uncomfortable.  Max assist with all transfers/bedmobility.  Unable to maintain standing when assisted, LE's gave way and back to the bed.  Pt is too weak at this point and with shortness of breath noted though being on 2L02 via nasal canula.  Pt postitioned supine with max assist and reported comfort.    Follow Up Recommendations        Equipment Recommendations       Recommendations for Other Services       Precautions / Restrictions      Mobility  Bed Mobility Overal bed mobility: Needs Assistance Bed Mobility: Supine to Sit;Sit to Supine     Supine to sit: Max assist;HOB elevated Sit to supine: Max assist   General bed mobility comments: max A to bring BLE over to EOB and sit HOB up and to return to supine, total A to scoot up in bed using bedpad with boost assist  Transfers Overall transfer level: Needs assistance   Transfers: Sit to/from Stand Sit to Stand: Max assist;Mod assist         General transfer comment: unable,  dizziness upon sitting EOB and back pain so pt requests to return to supine  Ambulation/Gait                 Stairs             Wheelchair Mobility    Modified Rankin (Stroke Patients Only)       Balance                                            Cognition Arousal/Alertness: Lethargic Behavior During Therapy: Flat affect Overall Cognitive Status: No family/caregiver present to determine baseline cognitive functioning                                 General Comments: PT breathing heavily with general flat affect; requested to attempt transfer to chair as she was uncomfortable in bed.      Exercises      General Comments        Pertinent Vitals/Pain Pain Assessment: Faces Faces Pain Scale: Hurts even more    Home Living  Prior Function            PT Goals (current goals can now be found in the care plan section) Progress towards PT goals: Not progressing toward goals - comment    Frequency           PT Plan      Co-evaluation              AM-PAC PT "6 Clicks" Mobility   Outcome Measure  Help needed turning from your back to your side while in a flat bed without using bedrails?: A Lot Help needed moving from lying on your back to sitting on the side of a flat bed without using bedrails?: A Lot Help needed moving to and from a bed to a chair (including a wheelchair)?: A Lot Help needed standing up from a chair using your arms (e.g., wheelchair or bedside chair)?: A Lot Help needed to walk in hospital room?: Total Help needed climbing 3-5 steps with a railing? : Total 6 Click Score: 10    End of Session Equipment Utilized During Treatment: Oxygen Activity Tolerance: Patient limited by fatigue;Patient limited by lethargy Patient left: in bed;with call bell/phone within reach;with bed alarm set   PT Visit Diagnosis: Unsteadiness on feet (R26.81);Other abnormalities of  gait and mobility (R26.89);Muscle weakness (generalized) (M62.81);History of falling (Z91.81)     Time: 1157-2620 PT Time Calculation (min) (ACUTE ONLY): 15 min  Charges:  $Therapeutic Activity: 8-22 mins                     Teena Irani, PTA/CLT [place], [name] 04/05/2020, 4:40 PM

## 2020-04-05 NOTE — Progress Notes (Incomplete)
Initial Nutrition Assessment  DOCUMENTATION CODES:      INTERVENTION:     NUTRITION DIAGNOSIS:     related to   as evidenced by  .    GOAL:      MONITOR:      REASON FOR ASSESSMENT:   LOS (poor po -LOS day 6)    ASSESSMENT: Patient is an obese 87 yo female with     Poor meal intake- 0% since admission per documentation.   Labs reviewed: 2/9 CMP- glucose 161, BUN 64 (H), Cr 3.0 (H), Albumin 2.2 (L), WBC-15.6 (H), Hgb 8.2 (L).  Medications reviewed and include: lipitor, novolog, remeron.  IV fluids- 0.45% NaCLw/KCL @70  ml/hr.  NUTRITION - FOCUSED PHYSICAL EXAM:  {RD Focused Exam List:21252}  Diet Order:   Diet Order            DIET DYS 2 Room service appropriate? Yes; Fluid consistency: Nectar Thick  Diet effective now                 EDUCATION NEEDS:     Skin:     Last BM:     Height:   Ht Readings from Last 1 Encounters:  04/03/20 5\' 4"  (1.626 m)    Weight:   Wt Readings from Last 1 Encounters:  03/30/20 83.1 kg    Ideal Body Weight:   55 kg  BMI:  Body mass index is 31.45 kg/m.  Estimated Nutritional Needs:   Kcal:     Protein:     Fluid:      Colman Cater MS,RD,CSG,LDN Pager: #AMION

## 2020-04-05 NOTE — TOC Progression Note (Signed)
Transition of Care [facility]) - Progression Note    Patient Details  Name: Amy Wiggins MRN: [id number] Date of Birth: [birth year]  [facility] CM/SW Contact  Salome Arnt, [place] Phone Number: 04/05/2020, 11:44 AM  Clinical Narrative: [facility] will meet with pt's granddaughter tomorrow for teaching. Tommi Rumps with [place] home health updated. MD aware. Anticipate d/c tomorrow. TOC will continue to follow.       Expected Discharge Plan: [place] Barriers to Discharge: Continued Medical Work up  Expected Discharge Plan and Services Expected Discharge Plan: [place] In-house Referral: Clinical Social Work   Post Acute Care Choice: [place] arrangements for the past 2 months: [place]: RN,PT [place] Agency: [place] Date [place]: 03/30/20   Representative spoke with at [place]: [place] (Susan Moore) Interventions    Readmission Risk Interventions Readmission Risk Prevention Plan 09/10/2019  Transportation Screening Complete  PCP or Specialist Appt within 3-5 Days Not Complete  Not Complete comments Granddaughter assists patient with scheduling appointments  [place] or [place] Complete  Social Work Consult for [place] Planning/Counseling Complete  Palliative Care Screening Not Applicable  Medication Review Press photographer) Complete  Some recent data might be hidden

## 2020-04-05 NOTE — Care Management Important Message (Signed)
Important Message  Patient Details  Name: Amy Wiggins MRN: [id number] Date of Birth: 01/22/1932   Medicare Important Message Given:  Yes     [name] 04/05/2020, 11:37 AM

## 2020-04-05 NOTE — Plan of Care (Signed)
  Problem: Education: Goal: Knowledge of General Education information will improve Description: Including pain rating scale, medication(s)/side effects and non-pharmacologic comfort measures Outcome: Not Progressing   Problem: Health Behavior/Discharge Planning: Goal: Ability to manage health-related needs will improve Outcome: Not Progressing   Problem: Clinical Measurements: Goal: Ability to maintain clinical measurements within normal limits will improve Outcome: Not Progressing Goal: Will remain free from infection Outcome: Not Progressing Goal: Diagnostic test results will improve Outcome: Not Progressing Goal: Respiratory complications will improve Outcome: Progressing Goal: Cardiovascular complication will be avoided Outcome: Progressing   Problem: Activity: Goal: Risk for activity intolerance will decrease Outcome: Not Progressing   Problem: Nutrition: Goal: Adequate nutrition will be maintained Outcome: Not Progressing   Problem: Coping: Goal: Level of anxiety will decrease Outcome: Not Progressing   Problem: Elimination: Goal: Will not experience complications related to bowel motility Outcome: Progressing Goal: Will not experience complications related to urinary retention Outcome: Progressing   Problem: Pain Managment: Goal: General experience of comfort will improve Outcome: Progressing   Problem: Safety: Goal: Ability to remain free from injury will improve Outcome: Progressing   Problem: Skin Integrity: Goal: Risk for impaired skin integrity will decrease Outcome: Not Progressing

## 2020-04-05 NOTE — Progress Notes (Signed)
PROGRESS NOTE  Amy Wiggins FYB:[id number] DOB: [birth year] DOA: 03/29/2020 PCP: Asencion Noble, MD  Brief History:  87 y/o female with history ofCKD 4,diabetes mellitus type 2, hypertension, hyperlipidemia, paroxysmal atrial fibrillation, stroke, PFO, dCHF, bronchiectasis, COPD presenting with a fall Patient had her knees and hurt her left hip during the fall.  CT C-spine and head showed small amount of acute subarachnoid hemorrhage in setting of trauma, left frontal scalp and supra orbital swelling and soft tissue hematoma. No acute fracture of C-spine, C-spine spondylitic changes chronic. CT lumbar shows no acute fracture or static subluxation of lumbar spine.  Chest x-ray shows retrocardiac opacity that could be atelectasis versus infectious.  X-ray hip shows no acute findings.  Neurosurgery consulted from ED and reports patient can stay here for repeat CT which revealed stable bleed  Assessment/Plan: Sepsis -present on admission -due to E faecalis bacteremia -2/5 blood cultures nt -03/31/20 Echo (TTE)--EF 65-70, no WMA, mild-mod AS, mild MR, no veg -family declined TEE -PICC line place 04/04/20  Chronic respiratory failure with hypoxia -on 2L at home  -due to COPD and bronchiectasis  E Faecalis bacteremia -ID input noted -ceftriaxone 2g q12h ivp, ampicillin 2g q8h ivpb -stop date 04/29/20  Subarachnoid Hemorrhage -04/02/20 repeat CT shows decreased [facility] -outpatient neurosurgery follow up  chronicdiastolic CHF -clinically euvolemic  Atrial fibrillation with RVR, paroxysmal -CHADSVASc = 7 -discontinue apixaban in setting of SAH -high risk falls -continue diltiazem and amio  CKD stage4 -Baseline creatinine2.7-2.9 -A.m. BMP -not a HD candidate  Uncontrolled diabetes mellitus type 2 with hyperglycemia -6/5 holding lantus and ISS temporarily due to hypoglycemia -5/30/21hemoglobin A1c 8.4 -03/31/20 A1C--6.9 -restart lower dose levemir -allow more liberal  glycemic control at this point  Depression/anxiety -Continue Celexa -d/c mirtazapine due to lethargy-->more alert  Hyperlipidemia -Continue statin  Hypernatremia -improved with hypotonic fluid  Goals of Care -palliative followed patient -had GOC discussion with granddaughters (POA) 2/9 -DNR -plan for SNF Advance care planning, including the explanation and discussion of advance directives was carried out with the patient and family.  Code status including explanations of "Full Code" and "DNR" and alternatives were discussed in detail.  Discussion of end-of-life issues including but not limited palliative care, hospice care and the concept of hospice, other end-of-life care options, power of attorney for health care decisions, living wills, and physician orders for life-sustaining treatment were also discussed with the patient and family.  Total face to face time 16 minutes.        Status is: Inpatient  Remains inpatient appropriate because:IV treatments appropriate due to intensity of illness or inability to take PO   Dispo: The patient is from: Home              Anticipated d/c is to: SNF              Anticipated d/c date is: 2 days              Patient currently is not medically stable to d/c.   Difficult to place patient No        Family Communication:   granddaughter at bedside 2/9  Consultants:  palliative  Code Status:  DNR  DVT Prophylaxis:  SCDs   Procedures: As Listed in Progress Note Above  Antibiotics: Ceftriaxone/ampicllin     Subjective: Patient denies fevers, chills, headache, chest pain, dyspnea, nausea, vomiting, diarrhea, abdominal pain, dysuria,    Objective: Vitals:   04/05/20 0600 04/05/20 0857  04/05/20 1329 04/05/20 1334  BP: (!) 140/58   (!) 150/60  Pulse: 74   74  Resp:    16  Temp: 98.4 F (36.9 C)   99 F (37.2 C)  TempSrc: Oral   Oral  SpO2: 100% 96% 100% 100%  Weight:      Height:        Intake/Output  Summary (Last 24 hours) at 04/05/2020 1821 Last data filed at 04/05/2020 1255 Gross per 24 hour  Intake 459 ml  Output --  Net 459 ml   Weight change:  Exam:   General:  Pt is alert, follows commands appropriately, not in acute distress  HEENT: No icterus, No thrush, No neck mass, [place]/AT  Cardiovascular: RRR, S1/S2, no rubs, no gallops  Respiratory: bibasilar rales. No wheeze  Abdomen: Soft/+BS, non tender, non distended, no guarding  Extremities: No edema, No lymphangitis, No petechiae, No rashes, no synovitis   Data Reviewed: I have personally reviewed following labs and imaging studies Basic Metabolic Panel: Recent Labs  Lab 03/31/20 0524 04/01/20 0926 04/02/20 0609 04/03/20 0632 04/04/20 0734 04/05/20 0702  NA 133* 135 139 152* 142 143  K 3.9 3.5 3.4* 3.0* 3.8 4.2  CL 94* 99 103 115* 110 109  CO2 28 26 28 23 26 25   GLUCOSE 154* 75 204* 170* 209* 161*  BUN 65* 69* 65* 57* 65* 64*  CREATININE 3.09* 3.16* 2.93* 2.33* 3.07* 3.00*  CALCIUM 9.0 8.8* 8.9 7.4* 9.1 9.5  MG 2.4  --   --   --  2.7*  --    Liver Function Tests: Recent Labs  Lab 03/31/20 0524 04/02/20 0609 04/03/20 0632 04/04/20 0734 04/05/20 0702  AST 67* 48* 41 54* 49*  ALT 47* 41 33 44 43  ALKPHOS 131* 128* 99 160* 174*  BILITOT 1.0 0.9 0.7 0.7 0.6  PROT 5.6* 6.0* <3.0* 5.9* 6.1*  ALBUMIN 2.1* 2.3* 1.7* 2.2* 2.2*   No results for input(s): LIPASE, AMYLASE in the last 168 hours. No results for input(s): AMMONIA in the last 168 hours. Coagulation Profile: No results for input(s): INR, PROTIME in the last 168 hours. CBC: Recent Labs  Lab 04/01/20 0926 04/02/20 0609 04/03/20 0632 04/04/20 0734 04/05/20 0702  WBC 16.2* 16.9* 16.5* 16.0* 15.6*  NEUTROABS 13.1* 14.6* 14.6* 13.4* 13.5*  HGB 8.0* 8.4* 7.2* 8.1* 8.2*  HCT 27.3* 28.9* 25.8* 30.0* 31.3*  MCV 92.9 93.5 96.3 100.3* 101.3*  PLT 233 277 252 274 236   Cardiac Enzymes: No results for input(s): CKTOTAL, CKMB, CKMBINDEX, TROPONINI in  the last 168 hours. BNP: Invalid input(s): POCBNP CBG: Recent Labs  Lab 04/04/20 2238 04/05/20 0748 04/05/20 1122 04/05/20 1624 04/05/20 1649  GLUCAP 147* 148* 204* 149* 160*   HbA1C: No results for input(s): HGBA1C in the last 72 hours. Urine analysis:    Component Value Date/Time   COLORURINE AMBER (A) 03/30/2020 0450   APPEARANCEUR CLEAR 03/30/2020 0450   LABSPEC 1.009 03/30/2020 0450   PHURINE 5.0 03/30/2020 0450   GLUCOSEU 150 (A) 03/30/2020 0450   HGBUR SMALL (A) 03/30/2020 0450   BILIRUBINUR NEGATIVE 03/30/2020 0450   KETONESUR NEGATIVE 03/30/2020 0450   PROTEINUR NEGATIVE 03/30/2020 0450   UROBILINOGEN 0.2 12/12/2010 1102   NITRITE NEGATIVE 03/30/2020 0450   LEUKOCYTESUR NEGATIVE 03/30/2020 0450   Sepsis Labs: @LABRCNTIP (procalcitonin:4,lacticidven:4) ) Recent Results (from the past 240 hour(s))  SARS Coronavirus 2 by RT PCR (hospital order, performed in [facility] lab) Nasopharyngeal Nasopharyngeal Swab     Status:  None   Collection Time: 03/30/20 12:45 AM   Specimen: Nasopharyngeal Swab  Result Value Ref Range Status   SARS Coronavirus 2 NEGATIVE NEGATIVE Final    Comment: (NOTE) SARS-CoV-2 target nucleic acids are NOT DETECTED.  The SARS-CoV-2 RNA is generally detectable in upper and lower respiratory specimens during the acute phase of infection. The lowest concentration of SARS-CoV-2 viral copies this assay can detect is 250 copies / mL. A negative result does not preclude SARS-CoV-2 infection and should not be used as the sole basis for treatment or other patient management decisions.  A negative result may occur with improper specimen collection / handling, submission of specimen other than nasopharyngeal swab, presence of viral mutation(s) within the areas targeted by this assay, and inadequate number of viral copies (<250 copies / mL). A negative result must be combined with clinical observations, patient history, and epidemiological  information.  Fact Sheet for Patients:   StrictlyIdeas.no  Fact Sheet for Healthcare Providers: BankingDealers.co.za  This test is not yet approved or  cleared by the [place] FDA and has been authorized for detection and/or diagnosis of SARS-CoV-2 by FDA under an Emergency Use Authorization (EUA).  This EUA will remain in effect (meaning this test can be used) for the duration of the COVID-19 declaration under Section 564(b)(1) of the Act, 21 U.S.C. section 360bbb-3(b)(1), unless the authorization is terminated or revoked sooner.  Performed at [facility], [address], [place], [place] [address]   Culture, blood (routine x 2)     Status: Abnormal   Collection Time: 03/30/20 12:45 AM   Specimen: BLOOD  Result Value Ref Range Status   Specimen Description   Final    BLOOD Performed at [facility], [address] [place], [place] [address]    Special Requests   Final    Normal Performed at [facility], [address] [place], [place] [address]    Culture  Setup Time   Final    GRAM POSITIVE COCCI IN BOTH AEROBIC AND ANAEROBIC BOTTLES Gram Stain Report Called to,Read Back By and Verified With: WHITE,M RN @1510  03/30/20 BY JONES,T Performed at [place] [facility] ID to follow CRITICAL RESULT CALLED TO, READ BACK BY AND VERIFIED WITH: Gavin Pound RN 2134 03/30/20 A BROWNING Performed at [facility], [place] [address] [place],  [address]    Culture ENTEROCOCCUS FAECALIS (A)  Final   Report Status 04/01/2020 FINAL  Final   Organism ID, Bacteria ENTEROCOCCUS FAECALIS  Final      Susceptibility   Enterococcus faecalis - MIC*    AMPICILLIN <=2 SENSITIVE Sensitive     VANCOMYCIN 1 SENSITIVE Sensitive     GENTAMICIN SYNERGY SENSITIVE Sensitive     * ENTEROCOCCUS FAECALIS  Blood Culture ID Panel (Reflexed)     Status: Abnormal   Collection Time: 03/30/20 12:45 AM  Result Value Ref Range Status   Enterococcus  faecalis DETECTED (A) NOT DETECTED Final    Comment: CRITICAL RESULT CALLED TO, READ BACK BY AND VERIFIED WITH: A LEONARD RN 2134 03/30/20 A BROWNING    Enterococcus Faecium NOT DETECTED NOT DETECTED Final   Listeria monocytogenes NOT DETECTED NOT DETECTED Final   Staphylococcus species NOT DETECTED NOT DETECTED Final   Staphylococcus aureus (BCID) NOT DETECTED NOT DETECTED Final   Staphylococcus epidermidis NOT DETECTED NOT DETECTED Final   Staphylococcus lugdunensis NOT DETECTED NOT DETECTED Final   Streptococcus species NOT DETECTED NOT DETECTED Final   Streptococcus agalactiae NOT DETECTED NOT DETECTED  Final   Streptococcus pneumoniae NOT DETECTED NOT DETECTED Final   Streptococcus pyogenes NOT DETECTED NOT DETECTED Final   A.calcoaceticus-baumannii NOT DETECTED NOT DETECTED Final   Bacteroides fragilis NOT DETECTED NOT DETECTED Final   Enterobacterales NOT DETECTED NOT DETECTED Final   Enterobacter cloacae complex NOT DETECTED NOT DETECTED Final   Escherichia coli NOT DETECTED NOT DETECTED Final   Klebsiella aerogenes NOT DETECTED NOT DETECTED Final   Klebsiella oxytoca NOT DETECTED NOT DETECTED Final   Klebsiella pneumoniae NOT DETECTED NOT DETECTED Final   Proteus species NOT DETECTED NOT DETECTED Final   Salmonella species NOT DETECTED NOT DETECTED Final   Serratia marcescens NOT DETECTED NOT DETECTED Final   Haemophilus influenzae NOT DETECTED NOT DETECTED Final   Neisseria meningitidis NOT DETECTED NOT DETECTED Final   Pseudomonas aeruginosa NOT DETECTED NOT DETECTED Final   Stenotrophomonas maltophilia NOT DETECTED NOT DETECTED Final   Candida albicans NOT DETECTED NOT DETECTED Final   Candida auris NOT DETECTED NOT DETECTED Final   Candida glabrata NOT DETECTED NOT DETECTED Final   Candida krusei NOT DETECTED NOT DETECTED Final   Candida parapsilosis NOT DETECTED NOT DETECTED Final   Candida tropicalis NOT DETECTED NOT DETECTED Final   Cryptococcus neoformans/gattii NOT  DETECTED NOT DETECTED Final   Vancomycin resistance NOT DETECTED NOT DETECTED Final    Comment: Performed at [facility], [address] [place], [place] [address]  Culture, blood (routine x 2)     Status: Abnormal   Collection Time: 03/30/20  1:02 AM   Specimen: BLOOD  Result Value Ref Range Status   Specimen Description   Final    BLOOD LEFT ANTECUBITAL Performed at [facility], [address], [place], [place] [address]    Special Requests   Final    BOTTLES DRAWN AEROBIC AND ANAEROBIC Blood Culture adequate volume Performed at [facility], [address] [place], [place] [address]    Culture  Setup Time   Final    GRAM POSITIVE COCCI IN BOTH AEROBIC AND ANAEROBIC BOTTLES Gram Stain Report Called to,Read Back By and Verified With: WHITE,M RN @1510  03/30/20 BY JONES,T Performed at [facility] TO, READ BACK BY AND VERIFIED WITH: A LEONARD RN 2134 03/30/20 A BROWNING    Culture (A)  Final    ENTEROCOCCUS FAECALIS SUSCEPTIBILITIES PERFORMED ON PREVIOUS CULTURE WITHIN THE LAST 5 DAYS. Performed at [facility], [place] [address], [place], [place] [address]    Report Status 04/01/2020 FINAL  Final  Urine culture     Status: None   Collection Time: 03/30/20  4:50 AM   Specimen: Urine, Catheterized  Result Value Ref Range Status   Specimen Description   Final    URINE, CATHETERIZED Performed at [facility], [address], [place], [place] [address]    Special Requests   Final    Normal Performed at [facility], [address], [place], [place] [address]    Culture   Final    NO GROWTH Performed at [facility], [place] [address] [place],  [address]    Report Status 03/31/2020 FINAL  Final  Culture, blood (Routine X 2) w Reflex to ID Panel     Status: None   Collection Time: 03/31/20  8:52 AM   Specimen: BLOOD  Result Value Ref Range Status   Specimen Description BLOOD LEFT ANTECUBITAL  Final    Special Requests   Final    BOTTLES DRAWN  AEROBIC AND ANAEROBIC Blood Culture adequate volume   Culture   Final    NO GROWTH 5 DAYS Performed at [facility], [address], [place], [place] [address]    Report Status 04/05/2020 FINAL  Final  Culture, blood (routine x 2)     Status: None (Preliminary result)   Collection Time: 04/01/20  4:51 PM   Specimen: BLOOD  Result Value Ref Range Status   Specimen Description BLOOD RIGHT ANTECUBITAL  Final   Special Requests   Final    BOTTLES DRAWN AEROBIC AND ANAEROBIC Blood Culture adequate volume   Culture   Final    NO GROWTH 4 DAYS Performed at [facility], [address], [place], [place] [address]    Report Status PENDING  Incomplete  Culture, blood (routine x 2)     Status: None (Preliminary result)   Collection Time: 04/01/20  4:59 PM   Specimen: BLOOD RIGHT HAND  Result Value Ref Range Status   Specimen Description BLOOD RIGHT HAND  Final   Special Requests   Final    BOTTLES DRAWN AEROBIC AND ANAEROBIC Blood Culture adequate volume   Culture   Final    NO GROWTH 4 DAYS Performed at [facility], [address] [place] [address], [place], [place] [address]    Report Status PENDING  Incomplete     Scheduled Meds: . amiodarone  200 mg Oral Daily  . atorvastatin  20 mg Oral Daily  . Chlorhexidine Gluconate Cloth  6 each Topical Daily  . citalopram  20 mg Oral Daily  . diltiazem  120 mg Oral Daily  . insulin aspart  0-15 Units Subcutaneous TID WC  . insulin aspart  0-5 Units Subcutaneous QHS  . insulin detemir  8 Units Subcutaneous QHS  . ipratropium-albuterol  3 mL Nebulization TID  . mirtazapine  15 mg Oral QHS  . traZODone  75 mg Oral QHS   Continuous Infusions: . 0.45 % NaCl with KCl 20 mEq / L 70 mL/hr at 04/05/20 0847  . sodium chloride 1,000 mL (03/30/20 1606)  . ampicillin (OMNIPEN) IV 2 g (04/05/20 1229)  . cefTRIAXone (ROCEPHIN)  IV 2 g (04/05/20 0849)    Procedures/Studies: DG Chest 1 View  Result Date: 03/30/2020 CLINICAL  DATA:  Fall with fever EXAM: CHEST  1 VIEW COMPARISON:  None. FINDINGS: The heart size and mediastinal contours are mildly enlarged . Aortic knob calcifications are seen. Patchy retrocardiac airspace opacity is noted with air bronchograms. The visualized skeletal structures are unremarkable. IMPRESSION: Retrocardiac opacity which could be atelectasis and/or infectious etiology Electronically Signed   By: Prudencio Pair M.D.   On: 03/30/2020 01:52   CT HEAD WO CONTRAST  Result Date: 04/02/2020 CLINICAL DATA:  87 year old female with headache. Status post fall with trace subarachnoid hemorrhage. EXAM: CT HEAD WITHOUT CONTRAST TECHNIQUE: Contiguous axial images were obtained from the base of the skull through the vertex without intravenous contrast. COMPARISON:  Head CTs 03/30/2020 and earlier. FINDINGS: Brain: Partially resolved now subtle subarachnoid hemorrhage at the superior central sulcus (series 2, image 28). No intraventricular blood or ventriculomegaly. No new intracranial hemorrhage identified. No midline shift, mass effect, or evidence of intracranial mass lesion. Stable encephalomalacia right superior perirolandic cortex, bilateral occipital lobes, left thalamus, left cerebellum. No cortically based acute infarct identified. Vascular: Calcified atherosclerosis at the skull base. No suspicious intracranial vascular hyperdensity. Skull: Stable.  Hyperostosis.  No skull fracture identified. Sinuses/Orbits: Increasing sphenoid and right frontal sinus mucosal thickening with small volume sinus fluid. Tympanic  cavities and mastoids remain clear. Other: Calcified scalp vessel atherosclerosis. Small left vertex scalp hematoma. Orbits soft tissues are stable. IMPRESSION: 1. Decreasing subarachnoid hemorrhage at the left superior convexity with only subtle residual. 2. No new intracranial abnormality. Stable multifocal chronic infarcts. 3. Small left vertex scalp hematoma without underlying skull fracture.  Electronically Signed   By: Genevie Ann M.D.   On: 04/02/2020 07:11   CT HEAD WO CONTRAST  Result Date: 03/30/2020 CLINICAL DATA:  Follow-up subarachnoid hemorrhage. EXAM: CT HEAD WITHOUT CONTRAST TECHNIQUE: Contiguous axial images were obtained from the base of the skull through the vertex without intravenous contrast. COMPARISON:  Same day head CT at 0140 hours. FINDINGS: Brain: Similar solid amount of subarachnoid hemorrhage seen along the surface of the precentral gyrus (2/26). No new areas of intra or extra axial hemorrhage visualized. Again seen is the remote cortically based infarct in the right precentral gyrus. Encephalomalacia again seen in the bilateral occipital lobes and cerebellar hemispheres. Patchy areas of white matter hypoattenuation most compatible with chronic microvascular angiopathy. No evidence of acute infarction, hydrocephalus mass lesion or mass effect. Similar mild degree symmetric ex vacuo dilatation of the ventricular system with commensurate parenchymal volume loss. Vascular: No hyperdense vessel or unexpected calcification. Atherosclerotic calcifications of the carotid siphons and vertebral arteries. Skull: No significant change in the left frontal scalp and supraorbital soft tissue swelling with extracalvarial hematoma. Hyperostosis frontalis interna. Sinuses/Orbits: Mild scattered mucosal thickening of the paranasal sinuses. Prior bilateral lens surgery. Similar asymmetric left periorbital and palpebral swelling without retro septal involvement. Other: None IMPRESSION: 1. Similar solid amount of subarachnoid hemorrhage along the surface of the left precentral gyrus. No new areas of intra or extra axial hemorrhage visualized. 2. No significant change in the left frontal scalp and supraorbital soft tissue swelling with extracalvarial hematoma. 3. Similar asymmetric left periorbital and palpebral swelling without retro septal involvement. Electronically Signed   By: Dahlia Bailiff MD    On: 03/30/2020 16:50   CT Head Wo Contrast  Result Date: 03/30/2020 CLINICAL DATA:  Fall, fever EXAM: CT HEAD WITHOUT CONTRAST CT CERVICAL SPINE WITHOUT CONTRAST TECHNIQUE: Multidetector CT imaging of the head and cervical spine was performed following the standard protocol without intravenous contrast. Multiplanar CT image reconstructions of the cervical spine were also generated. COMPARISON:  MR 02/22/2016, CT 02/21/2016, MR neck 06/02/2015 FINDINGS: CT HEAD FINDINGS Brain: Curvilinear hyperattenuation is seen along the surface of the precentral gyrus (2/24) concerning for a small amount of acute subarachnoid hemorrhage. No other sites of extra-axial or parenchymal hemorrhage. 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 Additional regions of encephalomalacia is seen in the bilateral occipital lobes and cerebellar hemispheres. No evidence of acute infarction, hydrocephalus, visible mass lesion or mass effect. Symmetric prominence of the ventricles, cisterns and sulci compatible with parenchymal volume loss. Patchy areas of white matter hypoattenuation are most compatible with chronic microvascular angiopathy. Vascular: Atherosclerotic calcification of the carotid siphons and intradural vertebral arteries. No hyperdense vessel. Skull: Left frontal scalp and supraorbital soft tissue swelling with crescentic scalp hematoma measuring up to 6 mm in maximal thickness. No other significant sites of scalp swelling. No subjacent calvarial fracture or visible facial bone fracture is evident within the included margins of imaging. Hyperostosis frontalis interna is a benign incidental finding. Sinuses/Orbits: Mild nodular mural thickening in the paranasal sinuses. No layering air-fluid levels or pneumatized secretions. Prior bilateral lens extractions. Asymmetric left periorbital and palpebral swelling without retro septal gas, stranding or hemorrhage. Remaining orbital  contents are unremarkable.  Other: None CT CERVICAL SPINE FINDINGS Alignment: Stabilization collar is absent at the time of exam. Mild straightening of normal cervical lordosis. Light stepwise anterolisthesis C2-C5 and minimal 2 mm of anterolisthesis C6 on C7 favored to be on a spondylitic basis given significant facet arthropathy and uncinate changes. No evidence of traumatic listhesis. No abnormally widened, perched or jumped facets. Normal alignment of the craniocervical and atlantoaxial articulations. Skull base and vertebrae: No acute skull base fracture. No vertebral body fracture or height loss. The osseous structures appear diffusely demineralized which may limit detection of small or nondisplaced fractures. No worrisome osseous lesions. Moderate arthrosis at the atlantodental and basion dens intervals with few small erosions and calcific pannus formation, nonspecific but can be seen with CPPD or rheumatoid arthropathy. Multilevel cervical spondylitic changes as detailed below. Enthesopathic changes noted along the spinous processes and nuchal ligament. Soft tissues and spinal canal: No pre or paravertebral fluid or swelling. No visible canal hematoma. Cervical carotid atherosclerosis. Airways patent. Disc levels: Multilevel intervertebral disc height loss with spondylitic endplate changes. Larger disc osteophyte complexes are present C3-C5 with some partial effacement of the ventral thecal sac. Additional disc osteophyte complex C6-5-C6 may result in some mild canal stenosis in combination with posterior ligamentum flavum infolding. Multilevel uncinate spurring and facet hypertrophic changes are present throughout the cervical spine as well resulting in mild-to-moderate multilevel neural foraminal narrowing most pronounced at the C3-4 level bilaterally. Upper chest: Atelectatic changes in the lung apices. Calcification of proximal great vessels noted incidentally. Other: No concerning thyroid nodules or masses. IMPRESSION: 1.  Curvilinear hyperattenuation along the surface of the left precentral gyrus concerning for a small amount of acute subarachnoid hemorrhage in the setting of trauma. No other acute intracranial abnormalities. 2. Left frontal scalp and supraorbital swelling and soft tissue hematoma. No subjacent calvarial or visible facial bone fracture. No orbital injury is identified. 3. No acute fracture or traumatic listhesis of the cervical spine. 4. 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 Additional regions of encephalomalacia in the bilateral occipital lobes and cerebellar hemispheres. 5. Multilevel cervical spondylitic changes, as above. 6. Moderate arthrosis at the atlantodental and basion dens intervals with erosions and calcific pannus formation, nonspecific but can be seen with CPPD or rheumatoid arthropathy. 7. Intracranial and cervical atherosclerosis. Critical Value/emergent results were called by telephone at the time of interpretation on 03/30/2020 at 2:24 am to provider Veryl Speak , who verbally acknowledged these results. Electronically Signed   By: Lovena Le M.D.   On: 03/30/2020 02:23   CT Cervical Spine Wo Contrast  Result Date: 03/30/2020 CLINICAL DATA:  Fall, fever EXAM: CT HEAD WITHOUT CONTRAST CT CERVICAL SPINE WITHOUT CONTRAST TECHNIQUE: Multidetector CT imaging of the head and cervical spine was performed following the standard protocol without intravenous contrast. Multiplanar CT image reconstructions of the cervical spine were also generated. COMPARISON:  MR 02/22/2016, CT 02/21/2016, MR neck 06/02/2015 FINDINGS: CT HEAD FINDINGS Brain: Curvilinear hyperattenuation is seen along the surface of the precentral gyrus (2/24) concerning for a small amount of acute subarachnoid hemorrhage. No other sites of extra-axial or parenchymal hemorrhage. 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 Additional regions of encephalomalacia is seen in the bilateral  occipital lobes and cerebellar hemispheres. No evidence of acute infarction, hydrocephalus, visible mass lesion or mass effect. Symmetric prominence of the ventricles, cisterns and sulci compatible with parenchymal volume loss. Patchy areas of white matter hypoattenuation are most compatible with chronic microvascular angiopathy.  Vascular: Atherosclerotic calcification of the carotid siphons and intradural vertebral arteries. No hyperdense vessel. Skull: Left frontal scalp and supraorbital soft tissue swelling with crescentic scalp hematoma measuring up to 6 mm in maximal thickness. No other significant sites of scalp swelling. No subjacent calvarial fracture or visible facial bone fracture is evident within the included margins of imaging. Hyperostosis frontalis interna is a benign incidental finding. Sinuses/Orbits: Mild nodular mural thickening in the paranasal sinuses. No layering air-fluid levels or pneumatized secretions. Prior bilateral lens extractions. Asymmetric left periorbital and palpebral swelling without retro septal gas, stranding or hemorrhage. Remaining orbital contents are unremarkable. Other: None CT CERVICAL SPINE FINDINGS Alignment: Stabilization collar is absent at the time of exam. Mild straightening of normal cervical lordosis. Light stepwise anterolisthesis C2-C5 and minimal 2 mm of anterolisthesis C6 on C7 favored to be on a spondylitic basis given significant facet arthropathy and uncinate changes. No evidence of traumatic listhesis. No abnormally widened, perched or jumped facets. Normal alignment of the craniocervical and atlantoaxial articulations. Skull base and vertebrae: No acute skull base fracture. No vertebral body fracture or height loss. The osseous structures appear diffusely demineralized which may limit detection of small or nondisplaced fractures. No worrisome osseous lesions. Moderate arthrosis at the atlantodental and basion dens intervals with few small erosions and  calcific pannus formation, nonspecific but can be seen with CPPD or rheumatoid arthropathy. Multilevel cervical spondylitic changes as detailed below. Enthesopathic changes noted along the spinous processes and nuchal ligament. Soft tissues and spinal canal: No pre or paravertebral fluid or swelling. No visible canal hematoma. Cervical carotid atherosclerosis. Airways patent. Disc levels: Multilevel intervertebral disc height loss with spondylitic endplate changes. Larger disc osteophyte complexes are present C3-C5 with some partial effacement of the ventral thecal sac. Additional disc osteophyte complex C6-5-C6 may result in some mild canal stenosis in combination with posterior ligamentum flavum infolding. Multilevel uncinate spurring and facet hypertrophic changes are present throughout the cervical spine as well resulting in mild-to-moderate multilevel neural foraminal narrowing most pronounced at the C3-4 level bilaterally. Upper chest: Atelectatic changes in the lung apices. Calcification of proximal great vessels noted incidentally. Other: No concerning thyroid nodules or masses. IMPRESSION: 1. Curvilinear hyperattenuation along the surface of the left precentral gyrus concerning for a small amount of acute subarachnoid hemorrhage in the setting of trauma. No other acute intracranial abnormalities. 2. Left frontal scalp and supraorbital swelling and soft tissue hematoma. No subjacent calvarial or visible facial bone fracture. No orbital injury is identified. 3. No acute fracture or traumatic listhesis of the cervical spine. 4. 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 Additional regions of encephalomalacia in the bilateral occipital lobes and cerebellar hemispheres. 5. Multilevel cervical spondylitic changes, as above. 6. Moderate arthrosis at the atlantodental and basion dens intervals with erosions and calcific pannus formation, nonspecific but can be seen with CPPD or rheumatoid  arthropathy. 7. Intracranial and cervical atherosclerosis. Critical Value/emergent results were called by telephone at the time of interpretation on 03/30/2020 at 2:24 am to provider Veryl Speak , who verbally acknowledged these results. Electronically Signed   By: Lovena Le M.D.   On: 03/30/2020 02:23   CT Lumbar Spine Wo Contrast  Result Date: 03/30/2020 CLINICAL DATA:  Trauma and low back pain.  Fall.  Fever. EXAM: CT LUMBAR SPINE WITHOUT CONTRAST TECHNIQUE: Multidetector CT imaging of the lumbar spine was performed without intravenous contrast administration. Multiplanar CT image reconstructions were also generated. COMPARISON:  None. FINDINGS: Segmentation: Standard Alignment: Normal Vertebrae: No  acute fracture or focal pathologic process. Paraspinal and other soft tissues: Calcific aortic atherosclerosis. Cholelithiasis. Disc levels: Multilevel degenerative disc disease without high-grade spinal canal or neural foraminal stenosis. Degenerative changes are greatest at L4-5 and L5-S1. IMPRESSION: 1. No acute fracture or static subluxation of the lumbar spine. 2. Aortic Atherosclerosis (ICD10-I70.0). Electronically Signed   By: Ulyses Jarred M.D.   On: 03/30/2020 02:13   DG CHEST PORT 1 VIEW  Result Date: 04/03/2020 CLINICAL DATA:  Fall 03/29/2020. EXAM: PORTABLE CHEST 1 VIEW COMPARISON:  03/30/2020 FINDINGS: Interval development of small left effusion and left lower lobe atelectasis. No displaced rib fracture identified on the left. Heart size upper normal.  Negative for heart failure or edema. IMPRESSION: Small left effusion and left lower lobe airspace disease. No displaced rib fracture identified on the left. Electronically Signed   By: Franchot Gallo M.D.   On: 04/03/2020 11:29   ECHOCARDIOGRAM COMPLETE  Result Date: 03/31/2020    ECHOCARDIOGRAM REPORT   Patient Name:   YASAMAN KOLEK Date of Exam: 03/31/2020 Medical Rec #:  [id number]         Height:       64.0 in Accession #:    [id number]         Weight:       183.2 lb Date of Birth:  [birth year]        BSA:          1.885 m Patient Age:    77 years          BP:           118/47 mmHg Patient Gender: F                 HR:           87 bpm. Exam Location:  [facility] Procedure: 2D Echo, Cardiac Doppler and Color Doppler Indications:    Bacteremia R78.81  History:        Patient has prior history of Echocardiogram examinations, most                 recent 07/28/2019. Stroke, Arrythmias:Atrial Fibrillation; Risk                 Factors:Hypertension, Diabetes and Dyslipidemia. CKD (chronic                 kidney disease) stage 4, GFR 15-29 ml/min.  Sonographer:    Alvino Chapel RCS Referring Phys: [place]  1. Left ventricular ejection fraction, by estimation, is 65 to 70%. The left ventricle has normal function. The left ventricle has no regional wall motion abnormalities. There is moderate left ventricular hypertrophy. Left ventricular diastolic parameters are indeterminate.  2. Right ventricular systolic function is normal. The right ventricular size is normal.  3. Left atrial size was severely dilated.  4. Right atrial size was mildly dilated.  5. The mitral valve is abnormal. Mild mitral valve regurgitation. Severe mitral stenosis. Severe mitral annular calcification.  6. The aortic valve has an indeterminant number of cusps. There is severe calcifcation of the aortic valve. There is severe thickening of the aortic valve. Aortic valve regurgitation is not visualized. Mild to moderate aortic valve stenosis.  7. The inferior vena cava is normal in size with greater than 50% respiratory variability, suggesting right atrial pressure of 3 mmHg. FINDINGS  Left Ventricle: Left ventricular ejection fraction, by estimation, is 65 to 70%. The left ventricle has normal function. The left ventricle  has no regional wall motion abnormalities. The left ventricular internal cavity size was normal in size. There is  moderate left ventricular  hypertrophy. Left ventricular diastolic parameters are indeterminate. Right Ventricle: The right ventricular size is normal. No increase in right ventricular wall thickness. Right ventricular systolic function is normal. Left Atrium: Left atrial size was severely dilated. Right Atrium: Right atrial size was mildly dilated. Pericardium: There is no evidence of pericardial effusion. Mitral Valve: The mitral valve is abnormal. Severe mitral annular calcification. Mild mitral valve regurgitation. Severe mitral valve stenosis. MV peak gradient, 39.7 mmHg. The mean mitral valve gradient is 13.0 mmHg. Tricuspid Valve: The tricuspid valve is normal in structure. Tricuspid valve regurgitation is mild . No evidence of tricuspid stenosis. Aortic Valve: The aortic valve has an indeterminant number of cusps. There is severe calcifcation of the aortic valve. There is severe thickening of the aortic valve. There is severe aortic valve annular calcification. Aortic valve regurgitation is not visualized. Mild to moderate aortic stenosis is present. Aortic valve mean gradient measures 12.0 mmHg. Aortic valve peak gradient measures 23.0 mmHg. Aortic valve area, by VTI measures 1.36 cm. Pulmonic Valve: The pulmonic valve was not well visualized. Pulmonic valve regurgitation is not visualized. No evidence of pulmonic stenosis. Aorta: The aortic root is normal in size and structure. Pulmonary Artery: Indeterminant PASP, inadequate TR jet. Venous: The inferior vena cava is normal in size with greater than 50% respiratory variability, suggesting right atrial pressure of 3 mmHg. IAS/Shunts: No atrial level shunt detected by color flow Doppler.  LEFT VENTRICLE PLAX 2D LVIDd:         4.60 cm  Diastology LVIDs:         2.70 cm  LV e' medial:    5.22 cm/s LV PW:         1.40 cm  LV E/e' medial:  42.3 LV IVS:        1.20 cm  LV e' lateral:   9.14 cm/s LVOT diam:     1.90 cm  LV E/e' lateral: 24.2 LV SV:         67 LV SV Index:   36 LVOT Area:      2.84 cm  RIGHT VENTRICLE RV S prime:     15.00 cm/s TAPSE (M-mode): 2.9 cm LEFT ATRIUM              Index       RIGHT ATRIUM           Index LA diam:        4.10 cm  2.18 cm/m  RA Area:     18.40 cm LA Vol (A2C):   132.0 ml 70.03 ml/m RA Volume:   48.00 ml  25.47 ml/m LA Vol (A4C):   111.0 ml 58.89 ml/m LA Biplane Vol: 126.0 ml 66.85 ml/m  AORTIC VALVE AV Area (Vmax):    1.33 cm AV Area (Vmean):   1.61 cm AV Area (VTI):     1.36 cm AV Vmax:           240.00 cm/s AV Vmean:          150.000 cm/s AV VTI:            0.493 m AV Peak Grad:      23.0 mmHg AV Mean Grad:      12.0 mmHg LVOT Vmax:         113.00 cm/s LVOT Vmean:        85.100 cm/s LVOT  VTI:          0.236 m LVOT/AV VTI ratio: 0.48  AORTA Ao Root diam: 3.10 cm MITRAL VALVE MV Area (PHT): 2.20 cm     SHUNTS MV Area VTI:   0.66 cm     Systemic VTI:  0.24 m MV Peak grad:  39.7 mmHg    Systemic Diam: 1.90 cm MV Mean grad:  13.0 mmHg MV Vmax:       3.15 m/s MV Vmean:      147.0 cm/s MV Decel Time: 345 msec MV E velocity: 221.00 cm/s MV A velocity: 132.00 cm/s MV E/A ratio:  1.67 Carlyle Dolly MD Electronically signed by Carlyle Dolly MD Signature Date/Time: 03/31/2020/6:14:00 PM    Final    DG Hip Unilat W or Wo Pelvis 2-3 Views Left  Result Date: 03/30/2020 CLINICAL DATA:  Fall EXAM: DG HIP (WITH OR WITHOUT PELVIS) 2-3V LEFT COMPARISON:  None. FINDINGS: There is no evidence of hip fracture or dislocation. Moderate left hip osteoarthritis is seen with superior joint space loss and marginal osteophyte formation. There is diffuse osteopenia. There is no evidence of arthropathy or other focal bone abnormality. Scattered vascular calcifications are noted. IMPRESSION: No acute osseous abnormality Electronically Signed   By: Prudencio Pair M.D.   On: 03/30/2020 01:50   [place] EKG SITE RITE  Result Date: 04/04/2020 If Site Rite image not attached, placement could not be confirmed due to current cardiac rhythm.   Orson Eva, DO  [facility]  If  7PM-7AM, please contact night-coverage [email or web address] Password [email or web address] 04/05/2020, 6:21 PM   LOS: 6 days

## 2020-04-06 ENCOUNTER — Inpatient Hospital Stay (HOSPITAL_COMMUNITY): Payer: Medicare HMO

## 2020-04-06 DIAGNOSIS — Z7189 Other specified counseling: Secondary | ICD-10-CM | POA: Diagnosis not present

## 2020-04-06 DIAGNOSIS — I5032 Chronic diastolic (congestive) heart failure: Secondary | ICD-10-CM | POA: Diagnosis not present

## 2020-04-06 DIAGNOSIS — Z515 Encounter for palliative care: Secondary | ICD-10-CM | POA: Diagnosis not present

## 2020-04-06 DIAGNOSIS — I482 Chronic atrial fibrillation, unspecified: Secondary | ICD-10-CM | POA: Diagnosis not present

## 2020-04-06 DIAGNOSIS — I609 Nontraumatic subarachnoid hemorrhage, unspecified: Secondary | ICD-10-CM | POA: Diagnosis not present

## 2020-04-06 DIAGNOSIS — R7881 Bacteremia: Secondary | ICD-10-CM | POA: Diagnosis not present

## 2020-04-06 LAB — GLUCOSE, CAPILLARY
Glucose-Capillary: 204 mg/dL — ABNORMAL HIGH (ref 70–99)
Glucose-Capillary: 240 mg/dL — ABNORMAL HIGH (ref 70–99)
Glucose-Capillary: 134 mg/dL — ABNORMAL HIGH (ref 70–99)
Glucose-Capillary: 264 mg/dL — ABNORMAL HIGH (ref 70–99)

## 2020-04-06 LAB — COMPREHENSIVE METABOLIC PANEL
ALT: 44 U/L (ref 0–44)
AST: 45 U/L — ABNORMAL HIGH (ref 15–41)
Albumin: 2.1 g/dL — ABNORMAL LOW (ref 3.5–5.0)
Alkaline Phosphatase: 179 U/L — ABNORMAL HIGH (ref 38–126)
Anion gap: 6 (ref 5–15)
BUN: 62 mg/dL — ABNORMAL HIGH (ref 8–23)
CO2: 24 mmol/L (ref 22–32)
Calcium: 9.6 mg/dL (ref 8.9–10.3)
Chloride: 109 mmol/L (ref 98–111)
Creatinine, Ser: 3.07 mg/dL — ABNORMAL HIGH (ref 0.44–1.00)
GFR, Estimated: 14 mL/min — ABNORMAL LOW (ref >60)
Glucose, Bld: 198 mg/dL — ABNORMAL HIGH (ref 70–99)
Potassium: 4.2 mmol/L (ref 3.5–5.1)
Sodium: 139 mmol/L (ref 135–145)
Total Bilirubin: 0.7 mg/dL (ref 0.3–1.2)
Total Protein: 6.1 g/dL — ABNORMAL LOW (ref 6.5–8.1)

## 2020-04-06 LAB — CULTURE, BLOOD (ROUTINE X 2)
Culture: NO GROWTH
Culture: NO GROWTH
Special Requests: ADEQUATE
Special Requests: ADEQUATE

## 2020-04-06 LAB — MAGNESIUM: Magnesium: 2.5 mg/dL — ABNORMAL HIGH (ref 1.7–2.4)

## 2020-04-06 LAB — MRSA PCR SCREENING: MRSA by PCR: POSITIVE — AB

## 2020-04-06 MED ORDER — IPRATROPIUM-ALBUTEROL 0.5-2.5 (3) MG/3ML IN SOLN
3.0000 mL | Freq: Two times a day (BID) | RESPIRATORY_TRACT | Status: DC
  Administered 2020-04-06 – 2020-04-07 (×2): 3 mL via RESPIRATORY_TRACT
  Filled 2020-04-06 (×2): qty 3

## 2020-04-06 MED ORDER — MUPIROCIN 2 % EX OINT
TOPICAL_OINTMENT | Freq: Two times a day (BID) | CUTANEOUS | Status: DC
  Administered 2020-04-06: 1 via NASAL
  Filled 2020-04-06 (×2): qty 22

## 2020-04-06 NOTE — NC FL2 (Signed)
[place] LEVEL OF CARE SCREENING TOOL     IDENTIFICATION  Patient Name: Amy Wiggins Birthdate: 10/15/1931 Sex: female Admission Date (Current Location): 03/29/2020  [facility] and [place] Number:  [employer] and Address:  [facility] [address] [place]      Provider Number: [id number]  Attending Physician Name and Address:  Orson Eva, MD  Relative Name and Phone Number:  [name] - granddaughter [phone]    Current Level of Care: Hospital Recommended Level of Care: [place] Prior Approval Number:    Date Approved/Denied:   PASRR Number: Pending  Discharge Plan: SNF    Current Diagnoses: Patient Active Problem List   Diagnosis Date Noted  . Sepsis due to Enterococcus ([place]) 04/05/2020  . Bacteremia due to Enterococcus 03/31/2020  . Gait instability 03/31/2020  . Fall 03/31/2020  . Fever 03/31/2020  . Subarachnoid hemorrhage ([place]) 03/30/2020  . Intracranial hemorrhage ([place]) 03/30/2020  . Acute febrile illness   . Anemia, iron deficiency 11/06/2019  . Acute on chronic anemia 09/09/2019  . Hypertension associated with stage 4 chronic kidney disease due to type 2 diabetes mellitus ([place]) 08/04/2019  . Hyperlipidemia associated with type 2 diabetes mellitus ([place]) 08/04/2019  . CKD stage 4 due to type 2 diabetes mellitus ([place]) 08/04/2019  . Gastroesophageal reflux disease 08/04/2019  . Chronic constipation 08/04/2019  . Major depression, recurrent, chronic ([place]) 08/04/2019  . Hypomagnesemia 08/04/2019  . Ground glass opacity present on imaging of lung 08/04/2019  . Goals of care, counseling/discussion   . Palliative care by specialist   . Acute on chronic diastolic (congestive) heart failure ([place]) 07/24/2019  . Acute on chronic respiratory failure with hypoxia ([place]) 09/29/2018  . Atrial fibrillation with RVR ([place])   . Atrial fibrillation, chronic ([place]) 09/28/2018  . Cerebellar infarct  ([place])   . Stroke ([place]) 02/22/2016  . HFpEF/Chronic diastolic congestive heart failure ([place]) 02/22/2016  . Shortness of breath 01/14/2016  . Cerebrovascular accident (CVA) due to embolism of right posterior cerebral artery ([place]) 08/08/2015  . Essential hypertension 08/08/2015  . HLD (hyperlipidemia) 08/08/2015  . Type 2 diabetes mellitus with circulatory disorder ([place]) 08/08/2015  . PFO (patent foramen ovale)   . Hyperlipidemia 06/02/2015  . CKD (chronic kidney disease) stage 4, GFR 15-29 ml/min (HCC) 06/02/2015  . DM type 2 (diabetes mellitus, type 2) ([place]) 06/02/2015  . Leukocytosis 06/02/2015  . Muscle weakness (generalized) 09/16/2012  . Pain in joint, shoulder region 09/16/2012    Orientation RESPIRATION BLADDER Height & Weight     Self  Normal,O2 (3L) Incontinent,External catheter Weight: 83.1 kg Height:  5\' 4"  (162.6 cm)  BEHAVIORAL SYMPTOMS/MOOD NEUROLOGICAL BOWEL NUTRITION STATUS      Incontinent Diet (DC summary)  AMBULATORY STATUS COMMUNICATION OF NEEDS Skin   Extensive Assist Verbally Bruising (generalized)                       Personal Care Assistance Level of Assistance  Bathing,Feeding,Dressing Bathing Assistance: Maximum assistance Feeding assistance: Limited assistance Dressing Assistance: Maximum assistance     Functional Limitations Info  Sight,Hearing,Speech Sight Info: Adequate Hearing Info: Adequate Speech Info: Adequate    SPECIAL CARE FACTORS FREQUENCY  PT (By licensed PT)     PT Frequency: 5 times a week              Contractures Contractures Info: Not present    Additional Factors Info  Code Status,Allergies Code Status Info:  DNR Allergies Info: NKDA           Current Medications (04/06/2020):  This is the current hospital active medication list Current Facility-Administered Medications  Medication Dose Route Frequency Provider Last Rate Last Admin  . 0.45 % NaCl with KCl 20 mEq / L infusion   Intravenous Continuous  Irwin Brakeman L, MD 70 mL/hr at 04/06/20 0217 New Bag at 04/06/20 0217  . 0.9 %  sodium chloride infusion   Intravenous PRN Johnson, Clanford L, MD 10 mL/hr at 03/30/20 1606 1,000 mL at 03/30/20 1606  . acetaminophen (TYLENOL) tablet 650 mg  650 mg Oral Q6H PRN Zierle-Ghosh, Asia B, DO   650 mg at 04/03/20 2141   Or  . acetaminophen (TYLENOL) suppository 650 mg  650 mg Rectal Q6H PRN Zierle-Ghosh, Asia B, DO      . amiodarone (PACERONE) tablet 200 mg  200 mg Oral Daily Zierle-Ghosh, Asia B, DO   200 mg at 04/06/20 0842  . ampicillin (OMNIPEN) 2 g in sodium chloride 0.9 % 100 mL IVPB  2 g Intravenous Q8H Johnson, Clanford L, MD 300 mL/hr at 04/06/20 0437 2 g at 04/06/20 0437  . atorvastatin (LIPITOR) tablet 20 mg  20 mg Oral Daily Zierle-Ghosh, Asia B, DO   20 mg at 04/06/20 0842  . cefTRIAXone (ROCEPHIN) 2 g in sodium chloride 0.9 % 100 mL IVPB  2 g Intravenous Q12H Campbell Riches, MD 200 mL/hr at 04/06/20 0847 2 g at 04/06/20 0847  . Chlorhexidine Gluconate Cloth 2 % PADS 6 each  6 each Topical Daily Murlean Iba, MD   6 each at 04/06/20 0844  . citalopram (CELEXA) tablet 20 mg  20 mg Oral Daily Zierle-Ghosh, Asia B, DO   20 mg at 04/06/20 0842  . diltiazem (CARDIZEM CD) 24 hr capsule 120 mg  120 mg Oral Daily Zierle-Ghosh, Asia B, DO   120 mg at 04/06/20 0842  . insulin aspart (novoLOG) injection 0-15 Units  0-15 Units Subcutaneous TID WC Zierle-Ghosh, Asia B, DO   5 Units at 04/06/20 0818  . insulin aspart (novoLOG) injection 0-5 Units  0-5 Units Subcutaneous QHS Zierle-Ghosh, Asia B, DO   2 Units at 04/05/20 2300  . insulin detemir (LEVEMIR) injection 8 Units  8 Units Subcutaneous QHS Wynetta Emery, Clanford L, MD   8 Units at 04/05/20 2307  . ipratropium-albuterol (DUONEB) 0.5-2.5 (3) MG/3ML nebulizer solution 3 mL  3 mL Nebulization Q4H PRN Johnson, Clanford L, MD      . ipratropium-albuterol (DUONEB) 0.5-2.5 (3) MG/3ML nebulizer solution 3 mL  3 mL Nebulization BID Tat, David, MD       . mirtazapine (REMERON) tablet 15 mg  15 mg Oral QHS Zierle-Ghosh, Asia B, DO   15 mg at 04/05/20 2307  . ondansetron (ZOFRAN) tablet 4 mg  4 mg Oral Q6H PRN Zierle-Ghosh, Asia B, DO       Or  . ondansetron (ZOFRAN) injection 4 mg  4 mg Intravenous Q6H PRN Zierle-Ghosh, Asia B, DO      . oxyCODONE (Oxy IR/ROXICODONE) immediate release tablet 5 mg  5 mg Oral Q4H PRN Zierle-Ghosh, Asia B, DO   5 mg at 04/06/20 0843  . sodium chloride flush (NS) 0.9 % injection 10-40 mL  10-40 mL Intracatheter PRN Johnson, Clanford L, MD      . traZODone (DESYREL) tablet 75 mg  75 mg Oral QHS Zierle-Ghosh, Asia B, DO   75 mg at 04/05/20 2307     Discharge  Medications: Please see discharge summary for a list of discharge medications.  Relevant Imaging Results:  Relevant Lab Results:   Additional Information SS# [id number]  Boneta Lucks, RN

## 2020-04-06 NOTE — Progress Notes (Cosign Needed)
Palliative: Amy Wiggins is sitting up quietly in bed.  She is resting, but wakes easily when I enter.  She will make an somewhat keep eye contact.  She is oriented to person and place, I do not asked.  She is able to make her basic needs known.  There is no family at bedside at this time.  It seems that Amy Wiggins has improved somewhat, although she continues to be quite frail.  Asked how she is failing and she tells me that she is okay.  We talked about her blood infection and her fall.  She declines food or drink at this time.  Call to granddaughter/HC POA, Amy Wiggins. Shared that she is agreeable for STR.  Amy Wiggins talks about the multiple times that her grandmother has been in hospital, and she appreciated seeing attending face to face. Amy Wiggins talks about decline in her function.  I shared that IV antibiotics are to continue through March 5.  We talk about STR, rehab, nursing care. We talk about some what if's and maybe's, what is next.  We talked about time for meaningful improvements.  We also talked about how to make choices for loved ones including keeping them at the center, are we doing something for her or to her.  I also greatly encouraged Amy Wiggins to work with the Education officer, museum at what ever facility Amy Wiggins discharges  Conference with attending, bedside nursing staff, transition of care team related to patient condition, needs, goals of care, disposition.  Plan:   Continue to treat the treatable but no CPR or intubation.  Now requesting short-term rehab with ultimate goal of returning home with 24/7 care.  75 minutes  Amy Axe, NP Palliative medicine team Team phone [phone] Greater than 50% of this time was spent counseling and coordinating care related to the above assessment and plan.

## 2020-04-06 NOTE — TOC Progression Note (Addendum)
Transition of Care [facility]) - Progression Note    Patient Details  Name: Amy Wiggins MRN: [id number] Date of Birth: 1932-01-05  [facility] CM/SW Contact  Boneta Lucks, RN Phone Number: 04/06/2020, 11:52 AM  Clinical Narrative:   Family now requesting SNF. [facility] is first choice, FL2 completed, [place] Must requested documents. Received PASSR [id number].  TOC called [facility], they will offer, daughter is accepting. [facility] started INS Auth.  TOC to follow for discharge possible tomorrow.   Addendum: [place] plan was to go home with IV antibiotics and HH.   Updated Carolynn Sayers and [place].   Expected Discharge Plan: Skilled Nursing Facility Barriers to Discharge: Continued Medical Work up,SNF Pending bed offer  Expected Discharge Plan and Services Expected Discharge Plan: [place] In-house Referral: Clinical Social Work   Post Acute Care Choice: Home Health Living arrangements for the past 2 months: [place]: RN,PT [place] Agency: [facility] Date [place]: 03/30/20   Representative spoke with at [place]: [facility]   Readmission Risk Interventions Readmission Risk Prevention Plan 04/06/2020 09/10/2019  Transportation Screening Complete Complete  PCP or Specialist Appt within 3-5 Days - Not Complete  Not Complete comments - Granddaughter assists patient with scheduling appointments  [place] or [place] - Complete  Social Work Consult for [place] Planning/Counseling - Complete  Palliative Care Screening - Not Applicable  Medication Review Press photographer) Complete Complete  HRI or Home Care Consult Complete -  SW Recovery Care/Counseling Consult Complete -  Palliative Care Screening Not Applicable -  Skilled Nursing Facility Complete -  Some recent data might be hidden

## 2020-04-06 NOTE — Progress Notes (Addendum)
PROGRESS NOTE  Amy Wiggins JGG:[id number] DOB: Apr 13, 1931 DOA: 03/29/2020 PCP: Asencion Noble, MD  Brief History:  87 y/o female with history ofCKD 4,diabetes mellitus type 2, hypertension, hyperlipidemia, paroxysmal atrial fibrillation, stroke, PFO, dCHF, bronchiectasis, COPD presenting with a fall Patient had her knees and hurt her left hip during the fall. CT C-spine and head showed small amount of acute subarachnoid hemorrhage in setting of trauma, left frontal scalp and supra orbital swelling and soft tissue hematoma. No acute fracture of C-spine, C-spine spondylitic changes chronic. CT lumbar shows no acute fracture or static subluxation of lumbar spine. Chest x-ray shows retrocardiac opacity that could be atelectasis versus infectious. X-ray hip shows no acute findings. Neurosurgery consulted from ED and reports patient can stay here for repeat CT which revealed stable bleed  Assessment/Plan: Sepsis -present on admission -due to E faecalis bacteremia -2/5 blood cultures neg -03/31/20 Echo (TTE)--EF 65-70, no WMA, mild-mod AS, mild MR, no veg -family declined TEE -PICC line place 04/04/20  Chronic respiratory failure with hypoxia -on 2L at home  -due to COPD and bronchiectasis  E Faecalis bacteremia -ID input noted -ceftriaxone 2g q12h ivp, ampicillin 2g q8h ivpb -stop date 04/29/20  Subarachnoid Hemorrhage -04/02/20 repeat CT shows decreased [facility] -outpatient neurosurgery follow up  chronicdiastolic CHF -clinically euvolemic  Atrial fibrillation with RVR, paroxysmal -CHADSVASc = 7 -discontinue apixaban in setting of SAH -high risk falls -continue diltiazem and amio  CKD stage4 -Baseline creatinine2.7-2.9 -A.m. BMP -not a HD candidate  Uncontrolled diabetes mellitus type 2 with hyperglycemia -6/5 holding lantus and ISS temporarily due to hypoglycemia -5/30/21hemoglobin A1c 8.4 -03/31/20 A1C--6.9 -restart lower dose levemir -allow more liberal  glycemic control at this point  Depression/anxiety -Continue Celexa -mirtazepine to help with apetite  Hyperlipidemia -Continue statin  Hypernatremia -improved with hypotonic fluid  Goals of Care -palliative followed patient -had GOC discussion with granddaughters (POA) 2/9 -DNR -plan for SNF Advance care planning, including the explanation and discussion of advance directives was carried out with the patient and family.  Code status including explanations of "Full Code" and "DNR" and alternatives were discussed in detail.  Discussion of end-of-life issues including but not limited palliative care, hospice care and the concept of hospice, other end-of-life care options, power of attorney for health care decisions, living wills, and physician orders for life-sustaining treatment were also discussed with the patient and family.  Total face to face time 16 minutes.        Status is: Inpatient  Remains inpatient appropriate because:IV treatments appropriate due to intensity of illness or inability to take PO   Dispo: The patient is from: Home  Anticipated d/c is to: SNF  Anticipated d/c date is: 2/11  Patient currently is not medically stable to d/c.              Difficult to place patient No        Family Communication:   granddaughter updated 2/10  Consultants:  palliative  Code Status:  DNR  DVT Prophylaxis:  SCDs   Procedures: As Listed in Progress Note Above  Antibiotics: Ceftriaxone/ampicllin      Subjective: Patient denies fevers, chills, headache, chest pain, dyspnea, nausea, vomiting, diarrhea, abdominal pain, dysuria, hematuria, hematochezia, and melena.   Objective: Vitals:   04/05/20 1334 04/05/20 2033 04/06/20 0457 04/06/20 1033  BP: (!) 150/60 139/70 (!) 106/48   Pulse: 74 87 70   Resp: 16 16 16    Temp: 99 F (  37.2 C) 98.5 F (36.9 C) 98.6 F (37 C)   TempSrc: Oral      SpO2: 100% 94% 93% 94%  Weight:      Height:        Intake/Output Summary (Last 24 hours) at 04/06/2020 1737 Last data filed at 04/06/2020 1705 Gross per 24 hour  Intake 349 ml  Output 650 ml  Net -301 ml   Weight change:  Exam:   General:  Pt is alert, follows commands appropriately, not in acute distress  HEENT: No icterus, No thrush, No neck mass, [place]/AT  Cardiovascular: RRR, S1/S2, no rubs, no gallops  Respiratory: bibasilar crackles. No wheeze  Abdomen: Soft/+BS, non tender, non distended, no guarding  Extremities: trace LE edema, No lymphangitis, No petechiae, No rashes, no synovitis   Data Reviewed: I have personally reviewed following labs and imaging studies Basic Metabolic Panel: Recent Labs  Lab 03/31/20 0524 04/01/20 0926 04/02/20 0609 04/03/20 0632 04/04/20 0734 04/05/20 0702 04/06/20 0703  NA 133*   < > 139 152* 142 143 139  K 3.9   < > 3.4* 3.0* 3.8 4.2 4.2  CL 94*   < > 103 115* 110 109 109  CO2 28   < > 28 23 26 25 24   GLUCOSE 154*   < > 204* 170* 209* 161* 198*  BUN 65*   < > 65* 57* 65* 64* 62*  CREATININE 3.09*   < > 2.93* 2.33* 3.07* 3.00* 3.07*  CALCIUM 9.0   < > 8.9 7.4* 9.1 9.5 9.6  MG 2.4  --   --   --  2.7*  --  2.5*   < > = values in this interval not displayed.   Liver Function Tests: Recent Labs  Lab 04/02/20 0609 04/03/20 5188 04/04/20 0734 04/05/20 0702 04/06/20 0703  AST 48* 41 54* 49* 45*  ALT 41 33 44 43 44  ALKPHOS 128* 99 160* 174* 179*  BILITOT 0.9 0.7 0.7 0.6 0.7  PROT 6.0* <3.0* 5.9* 6.1* 6.1*  ALBUMIN 2.3* 1.7* 2.2* 2.2* 2.1*   No results for input(s): LIPASE, AMYLASE in the last 168 hours. No results for input(s): AMMONIA in the last 168 hours. Coagulation Profile: No results for input(s): INR, PROTIME in the last 168 hours. CBC: Recent Labs  Lab 04/01/20 0926 04/02/20 0609 04/03/20 0632 04/04/20 0734 04/05/20 0702  WBC 16.2* 16.9* 16.5* 16.0* 15.6*  NEUTROABS 13.1* 14.6* 14.6* 13.4* 13.5*  HGB  8.0* 8.4* 7.2* 8.1* 8.2*  HCT 27.3* 28.9* 25.8* 30.0* 31.3*  MCV 92.9 93.5 96.3 100.3* 101.3*  PLT 233 277 252 274 236   Cardiac Enzymes: No results for input(s): CKTOTAL, CKMB, CKMBINDEX, TROPONINI in the last 168 hours. BNP: Invalid input(s): POCBNP CBG: Recent Labs  Lab 04/05/20 1649 04/05/20 2153 04/06/20 0740 04/06/20 1151 04/06/20 1600  GLUCAP 160* 220* 204* 240* 134*   HbA1C: No results for input(s): HGBA1C in the last 72 hours. Urine analysis:    Component Value Date/Time   COLORURINE AMBER (A) 03/30/2020 0450   APPEARANCEUR CLEAR 03/30/2020 0450   LABSPEC 1.009 03/30/2020 0450   PHURINE 5.0 03/30/2020 0450   GLUCOSEU 150 (A) 03/30/2020 0450   HGBUR SMALL (A) 03/30/2020 0450   BILIRUBINUR NEGATIVE 03/30/2020 0450   KETONESUR NEGATIVE 03/30/2020 0450   PROTEINUR NEGATIVE 03/30/2020 0450   UROBILINOGEN 0.2 12/12/2010 1102   NITRITE NEGATIVE 03/30/2020 0450   LEUKOCYTESUR NEGATIVE 03/30/2020 0450   Sepsis Labs: @LABRCNTIP (procalcitonin:4,lacticidven:4) ) Recent Results (from the past 240 hour(s))  SARS Coronavirus 2 by RT PCR (hospital order, performed in [facility] hospital lab) Nasopharyngeal Nasopharyngeal Swab     Status: None   Collection Time: 03/30/20 12:45 AM   Specimen: Nasopharyngeal Swab  Result Value Ref Range Status   SARS Coronavirus 2 NEGATIVE NEGATIVE Final    Comment: (NOTE) SARS-CoV-2 target nucleic acids are NOT DETECTED.  The SARS-CoV-2 RNA is generally detectable in upper and lower respiratory specimens during the acute phase of infection. The lowest concentration of SARS-CoV-2 viral copies this assay can detect is 250 copies / mL. A negative result does not preclude SARS-CoV-2 infection and should not be used as the sole basis for treatment or other patient management decisions.  A negative result may occur with improper specimen collection / handling, submission of specimen other than nasopharyngeal swab, presence of viral  mutation(s) within the areas targeted by this assay, and inadequate number of viral copies (<250 copies / mL). A negative result must be combined with clinical observations, patient history, and epidemiological information.  Fact Sheet for Patients:   StrictlyIdeas.no  Fact Sheet for Healthcare Providers: BankingDealers.co.za  This test is not yet approved or  cleared by the [place] FDA and has been authorized for detection and/or diagnosis of SARS-CoV-2 by FDA under an Emergency Use Authorization (EUA).  This EUA will remain in effect (meaning this test can be used) for the duration of the COVID-19 declaration under Section 564(b)(1) of the Act, 21 U.S.C. section 360bbb-3(b)(1), unless the authorization is terminated or revoked sooner.  Performed at [facility], [address], [place], [place] [address]   Culture, blood (routine x 2)     Status: Abnormal   Collection Time: 03/30/20 12:45 AM   Specimen: BLOOD  Result Value Ref Range Status   Specimen Description   Final    BLOOD Performed at [facility], [address] [place], [place] [address]    Special Requests   Final    Normal Performed at [facility], [address] [place], [place] [address]    Culture  Setup Time   Final    GRAM POSITIVE COCCI IN BOTH AEROBIC AND ANAEROBIC BOTTLES Gram Stain Report Called to,Read Back By and Verified With: WHITE,M RN @1510  03/30/20 BY JONES,T Performed at [place] ID to follow CRITICAL RESULT CALLED TO, READ BACK BY AND VERIFIED WITH: Gavin Pound RN 2134 03/30/20 A BROWNING Performed at [facility], [place] [address] [place], [place] [address]    Culture ENTEROCOCCUS FAECALIS (A)  Final   Report Status 04/01/2020 FINAL  Final   Organism ID, Bacteria ENTEROCOCCUS FAECALIS  Final      Susceptibility   Enterococcus faecalis - MIC*    AMPICILLIN <=2 SENSITIVE Sensitive     VANCOMYCIN 1 SENSITIVE Sensitive      GENTAMICIN SYNERGY SENSITIVE Sensitive     * ENTEROCOCCUS FAECALIS  Blood Culture ID Panel (Reflexed)     Status: Abnormal   Collection Time: 03/30/20 12:45 AM  Result Value Ref Range Status   Enterococcus faecalis DETECTED (A) NOT DETECTED Final    Comment: CRITICAL RESULT CALLED TO, READ BACK BY AND VERIFIED WITH: A LEONARD RN 2134 03/30/20 A BROWNING    Enterococcus Faecium NOT DETECTED NOT DETECTED Final   Listeria monocytogenes NOT DETECTED NOT DETECTED Final   Staphylococcus species NOT DETECTED NOT DETECTED Final   Staphylococcus aureus (BCID) NOT DETECTED NOT DETECTED Final   Staphylococcus epidermidis NOT DETECTED NOT DETECTED Final   Staphylococcus lugdunensis  NOT DETECTED NOT DETECTED Final   Streptococcus species NOT DETECTED NOT DETECTED Final   Streptococcus agalactiae NOT DETECTED NOT DETECTED Final   Streptococcus pneumoniae NOT DETECTED NOT DETECTED Final   Streptococcus pyogenes NOT DETECTED NOT DETECTED Final   A.calcoaceticus-baumannii NOT DETECTED NOT DETECTED Final   Bacteroides fragilis NOT DETECTED NOT DETECTED Final   Enterobacterales NOT DETECTED NOT DETECTED Final   Enterobacter cloacae complex NOT DETECTED NOT DETECTED Final   Escherichia coli NOT DETECTED NOT DETECTED Final   Klebsiella aerogenes NOT DETECTED NOT DETECTED Final   Klebsiella oxytoca NOT DETECTED NOT DETECTED Final   Klebsiella pneumoniae NOT DETECTED NOT DETECTED Final   Proteus species NOT DETECTED NOT DETECTED Final   Salmonella species NOT DETECTED NOT DETECTED Final   Serratia marcescens NOT DETECTED NOT DETECTED Final   Haemophilus influenzae NOT DETECTED NOT DETECTED Final   Neisseria meningitidis NOT DETECTED NOT DETECTED Final   Pseudomonas aeruginosa NOT DETECTED NOT DETECTED Final   Stenotrophomonas maltophilia NOT DETECTED NOT DETECTED Final   Candida albicans NOT DETECTED NOT DETECTED Final   Candida auris NOT DETECTED NOT DETECTED Final   Candida glabrata NOT DETECTED NOT  DETECTED Final   Candida krusei NOT DETECTED NOT DETECTED Final   Candida parapsilosis NOT DETECTED NOT DETECTED Final   Candida tropicalis NOT DETECTED NOT DETECTED Final   Cryptococcus neoformans/gattii NOT DETECTED NOT DETECTED Final   Vancomycin resistance NOT DETECTED NOT DETECTED Final    Comment: Performed at [facility], [address] [place], [place] [address]  Culture, blood (routine x 2)     Status: Abnormal   Collection Time: 03/30/20  1:02 AM   Specimen: BLOOD  Result Value Ref Range Status   Specimen Description   Final    BLOOD LEFT ANTECUBITAL Performed at [facility], [address], [place], [place] [address]    Special Requests   Final    BOTTLES DRAWN AEROBIC AND ANAEROBIC Blood Culture adequate volume Performed at [facility], [address] [place], [place] [address]    Culture  Setup Time   Final    GRAM POSITIVE COCCI IN BOTH AEROBIC AND ANAEROBIC BOTTLES Gram Stain Report Called to,Read Back By and Verified With: WHITE,M RN @1510  03/30/20 BY JONES,T Performed at [place] TO, READ BACK BY AND VERIFIED WITH: A LEONARD RN 2134 03/30/20 A BROWNING    Culture (A)  Final    ENTEROCOCCUS FAECALIS SUSCEPTIBILITIES PERFORMED ON PREVIOUS CULTURE WITHIN THE LAST 5 DAYS. Performed at [facility], [place] [address] [place], [place] [address]    Report Status 04/01/2020 FINAL  Final  Urine culture     Status: None   Collection Time: 03/30/20  4:50 AM   Specimen: Urine, Catheterized  Result Value Ref Range Status   Specimen Description   Final    URINE, CATHETERIZED Performed at [facility], [address] [place], [place] [address]    Special Requests   Final    Normal Performed at [facility], [address] [place], [place] [address]    Culture   Final    NO GROWTH Performed at [facility], [place] [address] [place], [place] [address]    Report Status 03/31/2020 FINAL  Final   Culture, blood (Routine X 2) w Reflex to ID Panel     Status: None   Collection Time: 03/31/20  8:52 AM   Specimen: BLOOD  Result Value Ref Range  Status   Specimen Description BLOOD LEFT ANTECUBITAL  Final   Special Requests   Final    BOTTLES DRAWN AEROBIC AND ANAEROBIC Blood Culture adequate volume   Culture   Final    NO GROWTH 5 DAYS Performed at [facility], [address], [place], [place] [address]    Report Status 04/05/2020 FINAL  Final  Culture, blood (routine x 2)     Status: None   Collection Time: 04/01/20  4:51 PM   Specimen: BLOOD  Result Value Ref Range Status   Specimen Description BLOOD RIGHT ANTECUBITAL  Final   Special Requests   Final    BOTTLES DRAWN AEROBIC AND ANAEROBIC Blood Culture adequate volume   Culture   Final    NO GROWTH 5 DAYS Performed at [facility], [address] [place], [place] [address]    Report Status 04/06/2020 FINAL  Final  Culture, blood (routine x 2)     Status: None   Collection Time: 04/01/20  4:59 PM   Specimen: BLOOD RIGHT HAND  Result Value Ref Range Status   Specimen Description BLOOD RIGHT HAND  Final   Special Requests   Final    BOTTLES DRAWN AEROBIC AND ANAEROBIC Blood Culture adequate volume   Culture   Final    NO GROWTH 5 DAYS Performed at [facility], [address], [place], [place] [address]    Report Status 04/06/2020 FINAL  Final  MRSA PCR Screening     Status: Abnormal   Collection Time: 04/06/20  9:36 AM   Specimen: Nasal Mucosa; Nasopharyngeal  Result Value Ref Range Status   MRSA by PCR POSITIVE (A) NEGATIVE Final    Comment:        The GeneXpert MRSA Assay (FDA approved for NASAL specimens only), is one component of a comprehensive MRSA colonization surveillance program. It is not intended to diagnose MRSA infection nor to guide or monitor treatment for MRSA infections. RESULT CALLED TO, READ BACK BY AND VERIFIED WITH: WRIGHT,E RN @1221  04/06/20 BY JONES,T Performed at [facility], [address], [place], [place] [address]      Scheduled Meds: . amiodarone  200 mg Oral Daily  . atorvastatin  20 mg Oral Daily  . Chlorhexidine Gluconate Cloth  6 each Topical Daily  . citalopram  20 mg Oral Daily  . diltiazem  120 mg Oral Daily  . insulin aspart  0-15 Units Subcutaneous TID WC  . insulin aspart  0-5 Units Subcutaneous QHS  . insulin detemir  8 Units Subcutaneous QHS  . ipratropium-albuterol  3 mL Nebulization BID  . mirtazapine  15 mg Oral QHS  . mupirocin ointment   Nasal BID  . traZODone  75 mg Oral QHS   Continuous Infusions: . 0.45 % NaCl with KCl 20 mEq / L 70 mL/hr at 04/06/20 0217  . sodium chloride 1,000 mL (03/30/20 1606)  . ampicillin (OMNIPEN) IV 2 g (04/06/20 1203)  . cefTRIAXone (ROCEPHIN)  IV 2 g (04/06/20 0847)    Procedures/Studies: DG Chest 1 View  Result Date: 03/30/2020 CLINICAL DATA:  Fall with fever EXAM: CHEST  1 VIEW COMPARISON:  None. FINDINGS: The heart size and mediastinal contours are mildly enlarged . Aortic knob calcifications are seen. Patchy retrocardiac airspace opacity is noted with air bronchograms. The visualized skeletal structures are unremarkable. IMPRESSION: Retrocardiac opacity which could be atelectasis and/or infectious etiology Electronically Signed   By: Prudencio Pair M.D.   On: 03/30/2020 01:52   CT HEAD WO  CONTRAST  Result Date: 04/02/2020 CLINICAL DATA:  88 year old female with headache. Status post fall with trace subarachnoid hemorrhage. EXAM: CT HEAD WITHOUT CONTRAST TECHNIQUE: Contiguous axial images were obtained from the base of the skull through the vertex without intravenous contrast. COMPARISON:  Head CTs 03/30/2020 and earlier. FINDINGS: Brain: Partially resolved now subtle subarachnoid hemorrhage at the superior central sulcus (series 2, image 28). No intraventricular blood or ventriculomegaly. No new intracranial hemorrhage identified. No midline shift, mass effect, or evidence of intracranial mass lesion. Stable  encephalomalacia right superior perirolandic cortex, bilateral occipital lobes, left thalamus, left cerebellum. No cortically based acute infarct identified. Vascular: Calcified atherosclerosis at the skull base. No suspicious intracranial vascular hyperdensity. Skull: Stable.  Hyperostosis.  No skull fracture identified. Sinuses/Orbits: Increasing sphenoid and right frontal sinus mucosal thickening with small volume sinus fluid. Tympanic cavities and mastoids remain clear. Other: Calcified scalp vessel atherosclerosis. Small left vertex scalp hematoma. Orbits soft tissues are stable. IMPRESSION: 1. Decreasing subarachnoid hemorrhage at the left superior convexity with only subtle residual. 2. No new intracranial abnormality. Stable multifocal chronic infarcts. 3. Small left vertex scalp hematoma without underlying skull fracture. Electronically Signed   By: Genevie Ann M.D.   On: 04/02/2020 07:11   CT HEAD WO CONTRAST  Result Date: 03/30/2020 CLINICAL DATA:  Follow-up subarachnoid hemorrhage. EXAM: CT HEAD WITHOUT CONTRAST TECHNIQUE: Contiguous axial images were obtained from the base of the skull through the vertex without intravenous contrast. COMPARISON:  Same day head CT at 0140 hours. FINDINGS: Brain: Similar solid amount of subarachnoid hemorrhage seen along the surface of the precentral gyrus (2/26). No new areas of intra or extra axial hemorrhage visualized. Again seen is the remote cortically based infarct in the right precentral gyrus. Encephalomalacia again seen in the bilateral occipital lobes and cerebellar hemispheres. Patchy areas of white matter hypoattenuation most compatible with chronic microvascular angiopathy. No evidence of acute infarction, hydrocephalus mass lesion or mass effect. Similar mild degree symmetric ex vacuo dilatation of the ventricular system with commensurate parenchymal volume loss. Vascular: No hyperdense vessel or unexpected calcification. Atherosclerotic calcifications of  the carotid siphons and vertebral arteries. Skull: No significant change in the left frontal scalp and supraorbital soft tissue swelling with extracalvarial hematoma. Hyperostosis frontalis interna. Sinuses/Orbits: Mild scattered mucosal thickening of the paranasal sinuses. Prior bilateral lens surgery. Similar asymmetric left periorbital and palpebral swelling without retro septal involvement. Other: None IMPRESSION: 1. Similar solid amount of subarachnoid hemorrhage along the surface of the left precentral gyrus. No new areas of intra or extra axial hemorrhage visualized. 2. No significant change in the left frontal scalp and supraorbital soft tissue swelling with extracalvarial hematoma. 3. Similar asymmetric left periorbital and palpebral swelling without retro septal involvement. Electronically Signed   By: Dahlia Bailiff MD   On: 03/30/2020 16:50   CT Head Wo Contrast  Result Date: 03/30/2020 CLINICAL DATA:  Fall, fever EXAM: CT HEAD WITHOUT CONTRAST CT CERVICAL SPINE WITHOUT CONTRAST TECHNIQUE: Multidetector CT imaging of the head and cervical spine was performed following the standard protocol without intravenous contrast. Multiplanar CT image reconstructions of the cervical spine were also generated. COMPARISON:  MR 02/22/2016, CT 02/21/2016, MR neck 06/02/2015 FINDINGS: CT HEAD FINDINGS Brain: Curvilinear hyperattenuation is seen along the surface of the precentral gyrus (2/24) concerning for a small amount of acute subarachnoid hemorrhage. No other sites of extra-axial or parenchymal hemorrhage. 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 Additional regions of encephalomalacia is seen in the bilateral occipital lobes and  cerebellar hemispheres. No evidence of acute infarction, hydrocephalus, visible mass lesion or mass effect. Symmetric prominence of the ventricles, cisterns and sulci compatible with parenchymal volume loss. Patchy areas of white matter hypoattenuation are most  compatible with chronic microvascular angiopathy. Vascular: Atherosclerotic calcification of the carotid siphons and intradural vertebral arteries. No hyperdense vessel. Skull: Left frontal scalp and supraorbital soft tissue swelling with crescentic scalp hematoma measuring up to 6 mm in maximal thickness. No other significant sites of scalp swelling. No subjacent calvarial fracture or visible facial bone fracture is evident within the included margins of imaging. Hyperostosis frontalis interna is a benign incidental finding. Sinuses/Orbits: Mild nodular mural thickening in the paranasal sinuses. No layering air-fluid levels or pneumatized secretions. Prior bilateral lens extractions. Asymmetric left periorbital and palpebral swelling without retro septal gas, stranding or hemorrhage. Remaining orbital contents are unremarkable. Other: None CT CERVICAL SPINE FINDINGS Alignment: Stabilization collar is absent at the time of exam. Mild straightening of normal cervical lordosis. Light stepwise anterolisthesis C2-C5 and minimal 2 mm of anterolisthesis C6 on C7 favored to be on a spondylitic basis given significant facet arthropathy and uncinate changes. No evidence of traumatic listhesis. No abnormally widened, perched or jumped facets. Normal alignment of the craniocervical and atlantoaxial articulations. Skull base and vertebrae: No acute skull base fracture. No vertebral body fracture or height loss. The osseous structures appear diffusely demineralized which may limit detection of small or nondisplaced fractures. No worrisome osseous lesions. Moderate arthrosis at the atlantodental and basion dens intervals with few small erosions and calcific pannus formation, nonspecific but can be seen with CPPD or rheumatoid arthropathy. Multilevel cervical spondylitic changes as detailed below. Enthesopathic changes noted along the spinous processes and nuchal ligament. Soft tissues and spinal canal: No pre or paravertebral  fluid or swelling. No visible canal hematoma. Cervical carotid atherosclerosis. Airways patent. Disc levels: Multilevel intervertebral disc height loss with spondylitic endplate changes. Larger disc osteophyte complexes are present C3-C5 with some partial effacement of the ventral thecal sac. Additional disc osteophyte complex C6-5-C6 may result in some mild canal stenosis in combination with posterior ligamentum flavum infolding. Multilevel uncinate spurring and facet hypertrophic changes are present throughout the cervical spine as well resulting in mild-to-moderate multilevel neural foraminal narrowing most pronounced at the C3-4 level bilaterally. Upper chest: Atelectatic changes in the lung apices. Calcification of proximal great vessels noted incidentally. Other: No concerning thyroid nodules or masses. IMPRESSION: 1. Curvilinear hyperattenuation along the surface of the left precentral gyrus concerning for a small amount of acute subarachnoid hemorrhage in the setting of trauma. No other acute intracranial abnormalities. 2. Left frontal scalp and supraorbital swelling and soft tissue hematoma. No subjacent calvarial or visible facial bone fracture. No orbital injury is identified. 3. No acute fracture or traumatic listhesis of the cervical spine. 4. 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 Additional regions of encephalomalacia in the bilateral occipital lobes and cerebellar hemispheres. 5. Multilevel cervical spondylitic changes, as above. 6. Moderate arthrosis at the atlantodental and basion dens intervals with erosions and calcific pannus formation, nonspecific but can be seen with CPPD or rheumatoid arthropathy. 7. Intracranial and cervical atherosclerosis. Critical Value/emergent results were called by telephone at the time of interpretation on 03/30/2020 at 2:24 am to provider Veryl Speak , who verbally acknowledged these results. Electronically Signed   By: Lovena Le M.D.   On:  03/30/2020 02:23   CT Cervical Spine Wo Contrast  Result Date: 03/30/2020 CLINICAL DATA:  Fall, fever EXAM: CT HEAD  WITHOUT CONTRAST CT CERVICAL SPINE WITHOUT CONTRAST TECHNIQUE: Multidetector CT imaging of the head and cervical spine was performed following the standard protocol without intravenous contrast. Multiplanar CT image reconstructions of the cervical spine were also generated. COMPARISON:  MR 02/22/2016, CT 02/21/2016, MR neck 06/02/2015 FINDINGS: CT HEAD FINDINGS Brain: Curvilinear hyperattenuation is seen along the surface of the precentral gyrus (2/24) concerning for a small amount of acute subarachnoid hemorrhage. No other sites of extra-axial or parenchymal hemorrhage. 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 Additional regions of encephalomalacia is seen in the bilateral occipital lobes and cerebellar hemispheres. No evidence of acute infarction, hydrocephalus, visible mass lesion or mass effect. Symmetric prominence of the ventricles, cisterns and sulci compatible with parenchymal volume loss. Patchy areas of white matter hypoattenuation are most compatible with chronic microvascular angiopathy. Vascular: Atherosclerotic calcification of the carotid siphons and intradural vertebral arteries. No hyperdense vessel. Skull: Left frontal scalp and supraorbital soft tissue swelling with crescentic scalp hematoma measuring up to 6 mm in maximal thickness. No other significant sites of scalp swelling. No subjacent calvarial fracture or visible facial bone fracture is evident within the included margins of imaging. Hyperostosis frontalis interna is a benign incidental finding. Sinuses/Orbits: Mild nodular mural thickening in the paranasal sinuses. No layering air-fluid levels or pneumatized secretions. Prior bilateral lens extractions. Asymmetric left periorbital and palpebral swelling without retro septal gas, stranding or hemorrhage. Remaining orbital contents are  unremarkable. Other: None CT CERVICAL SPINE FINDINGS Alignment: Stabilization collar is absent at the time of exam. Mild straightening of normal cervical lordosis. Light stepwise anterolisthesis C2-C5 and minimal 2 mm of anterolisthesis C6 on C7 favored to be on a spondylitic basis given significant facet arthropathy and uncinate changes. No evidence of traumatic listhesis. No abnormally widened, perched or jumped facets. Normal alignment of the craniocervical and atlantoaxial articulations. Skull base and vertebrae: No acute skull base fracture. No vertebral body fracture or height loss. The osseous structures appear diffusely demineralized which may limit detection of small or nondisplaced fractures. No worrisome osseous lesions. Moderate arthrosis at the atlantodental and basion dens intervals with few small erosions and calcific pannus formation, nonspecific but can be seen with CPPD or rheumatoid arthropathy. Multilevel cervical spondylitic changes as detailed below. Enthesopathic changes noted along the spinous processes and nuchal ligament. Soft tissues and spinal canal: No pre or paravertebral fluid or swelling. No visible canal hematoma. Cervical carotid atherosclerosis. Airways patent. Disc levels: Multilevel intervertebral disc height loss with spondylitic endplate changes. Larger disc osteophyte complexes are present C3-C5 with some partial effacement of the ventral thecal sac. Additional disc osteophyte complex C6-5-C6 may result in some mild canal stenosis in combination with posterior ligamentum flavum infolding. Multilevel uncinate spurring and facet hypertrophic changes are present throughout the cervical spine as well resulting in mild-to-moderate multilevel neural foraminal narrowing most pronounced at the C3-4 level bilaterally. Upper chest: Atelectatic changes in the lung apices. Calcification of proximal great vessels noted incidentally. Other: No concerning thyroid nodules or masses.  IMPRESSION: 1. Curvilinear hyperattenuation along the surface of the left precentral gyrus concerning for a small amount of acute subarachnoid hemorrhage in the setting of trauma. No other acute intracranial abnormalities. 2. Left frontal scalp and supraorbital swelling and soft tissue hematoma. No subjacent calvarial or visible facial bone fracture. No orbital injury is identified. 3. No acute fracture or traumatic listhesis of the cervical spine. 4. 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 Additional regions of encephalomalacia in the bilateral occipital lobes  and cerebellar hemispheres. 5. Multilevel cervical spondylitic changes, as above. 6. Moderate arthrosis at the atlantodental and basion dens intervals with erosions and calcific pannus formation, nonspecific but can be seen with CPPD or rheumatoid arthropathy. 7. Intracranial and cervical atherosclerosis. Critical Value/emergent results were called by telephone at the time of interpretation on 03/30/2020 at 2:24 am to provider Veryl Speak , who verbally acknowledged these results. Electronically Signed   By: Lovena Le M.D.   On: 03/30/2020 02:23   CT Lumbar Spine Wo Contrast  Result Date: 03/30/2020 CLINICAL DATA:  Trauma and low back pain.  Fall.  Fever. EXAM: CT LUMBAR SPINE WITHOUT CONTRAST TECHNIQUE: Multidetector CT imaging of the lumbar spine was performed without intravenous contrast administration. Multiplanar CT image reconstructions were also generated. COMPARISON:  None. FINDINGS: Segmentation: Standard Alignment: Normal Vertebrae: No acute fracture or focal pathologic process. Paraspinal and other soft tissues: Calcific aortic atherosclerosis. Cholelithiasis. Disc levels: Multilevel degenerative disc disease without high-grade spinal canal or neural foraminal stenosis. Degenerative changes are greatest at L4-5 and L5-S1. IMPRESSION: 1. No acute fracture or static subluxation of the lumbar spine. 2. Aortic  Atherosclerosis (ICD10-I70.0). Electronically Signed   By: Ulyses Jarred M.D.   On: 03/30/2020 02:13   DG CHEST PORT 1 VIEW  Result Date: 04/03/2020 CLINICAL DATA:  Fall 03/29/2020. EXAM: PORTABLE CHEST 1 VIEW COMPARISON:  03/30/2020 FINDINGS: Interval development of small left effusion and left lower lobe atelectasis. No displaced rib fracture identified on the left. Heart size upper normal.  Negative for heart failure or edema. IMPRESSION: Small left effusion and left lower lobe airspace disease. No displaced rib fracture identified on the left. Electronically Signed   By: Franchot Gallo M.D.   On: 04/03/2020 11:29   ECHOCARDIOGRAM COMPLETE  Result Date: 03/31/2020    ECHOCARDIOGRAM REPORT   Patient Name:   DRUANNE BOSQUES Date of Exam: 03/31/2020 Medical Rec #:  [id number]         Height:       64.0 in Accession #:    [id number]        Weight:       183.2 lb Date of Birth:  1931-08-02        BSA:          1.885 m Patient Age:    32 years          BP:           118/47 mmHg Patient Gender: F                 HR:           87 bpm. Exam Location:  [facility] Procedure: 2D Echo, Cardiac Doppler and Color Doppler Indications:    Bacteremia R78.81  History:        Patient has prior history of Echocardiogram examinations, most                 recent 07/28/2019. Stroke, Arrythmias:Atrial Fibrillation; Risk                 Factors:Hypertension, Diabetes and Dyslipidemia. CKD (chronic                 kidney disease) stage 4, GFR 15-29 ml/min.  Sonographer:    Alvino Chapel RCS Referring Phys: Wilbur [place]  1. Left ventricular ejection fraction, by estimation, is 65 to 70%. The left ventricle has normal function. The left ventricle has no regional wall motion abnormalities. There is moderate  left ventricular hypertrophy. Left ventricular diastolic parameters are indeterminate.  2. Right ventricular systolic function is normal. The right ventricular size is normal.  3. Left atrial size was severely  dilated.  4. Right atrial size was mildly dilated.  5. The mitral valve is abnormal. Mild mitral valve regurgitation. Severe mitral stenosis. Severe mitral annular calcification.  6. The aortic valve has an indeterminant number of cusps. There is severe calcifcation of the aortic valve. There is severe thickening of the aortic valve. Aortic valve regurgitation is not visualized. Mild to moderate aortic valve stenosis.  7. The inferior vena cava is normal in size with greater than 50% respiratory variability, suggesting right atrial pressure of 3 mmHg. FINDINGS  Left Ventricle: Left ventricular ejection fraction, by estimation, is 65 to 70%. The left ventricle has normal function. The left ventricle has no regional wall motion abnormalities. The left ventricular internal cavity size was normal in size. There is  moderate left ventricular hypertrophy. Left ventricular diastolic parameters are indeterminate. Right Ventricle: The right ventricular size is normal. No increase in right ventricular wall thickness. Right ventricular systolic function is normal. Left Atrium: Left atrial size was severely dilated. Right Atrium: Right atrial size was mildly dilated. Pericardium: There is no evidence of pericardial effusion. Mitral Valve: The mitral valve is abnormal. Severe mitral annular calcification. Mild mitral valve regurgitation. Severe mitral valve stenosis. MV peak gradient, 39.7 mmHg. The mean mitral valve gradient is 13.0 mmHg. Tricuspid Valve: The tricuspid valve is normal in structure. Tricuspid valve regurgitation is mild . No evidence of tricuspid stenosis. Aortic Valve: The aortic valve has an indeterminant number of cusps. There is severe calcifcation of the aortic valve. There is severe thickening of the aortic valve. There is severe aortic valve annular calcification. Aortic valve regurgitation is not visualized. Mild to moderate aortic stenosis is present. Aortic valve mean gradient measures 12.0 mmHg.  Aortic valve peak gradient measures 23.0 mmHg. Aortic valve area, by VTI measures 1.36 cm. Pulmonic Valve: The pulmonic valve was not well visualized. Pulmonic valve regurgitation is not visualized. No evidence of pulmonic stenosis. Aorta: The aortic root is normal in size and structure. Pulmonary Artery: Indeterminant PASP, inadequate TR jet. Venous: The inferior vena cava is normal in size with greater than 50% respiratory variability, suggesting right atrial pressure of 3 mmHg. IAS/Shunts: No atrial level shunt detected by color flow Doppler.  LEFT VENTRICLE PLAX 2D LVIDd:         4.60 cm  Diastology LVIDs:         2.70 cm  LV e' medial:    5.22 cm/s LV PW:         1.40 cm  LV E/e' medial:  42.3 LV IVS:        1.20 cm  LV e' lateral:   9.14 cm/s LVOT diam:     1.90 cm  LV E/e' lateral: 24.2 LV SV:         67 LV SV Index:   36 LVOT Area:     2.84 cm  RIGHT VENTRICLE RV S prime:     15.00 cm/s TAPSE (M-mode): 2.9 cm LEFT ATRIUM              Index       RIGHT ATRIUM           Index LA diam:        4.10 cm  2.18 cm/m  RA Area:     18.40 cm LA Vol (A2C):  132.0 ml 70.03 ml/m RA Volume:   48.00 ml  25.47 ml/m LA Vol (A4C):   111.0 ml 58.89 ml/m LA Biplane Vol: 126.0 ml 66.85 ml/m  AORTIC VALVE AV Area (Vmax):    1.33 cm AV Area (Vmean):   1.61 cm AV Area (VTI):     1.36 cm AV Vmax:           240.00 cm/s AV Vmean:          150.000 cm/s AV VTI:            0.493 m AV Peak Grad:      23.0 mmHg AV Mean Grad:      12.0 mmHg LVOT Vmax:         113.00 cm/s LVOT Vmean:        85.100 cm/s LVOT VTI:          0.236 m LVOT/AV VTI ratio: 0.48  AORTA Ao Root diam: 3.10 cm MITRAL VALVE MV Area (PHT): 2.20 cm     SHUNTS MV Area VTI:   0.66 cm     Systemic VTI:  0.24 m MV Peak grad:  39.7 mmHg    Systemic Diam: 1.90 cm MV Mean grad:  13.0 mmHg MV Vmax:       3.15 m/s MV Vmean:      147.0 cm/s MV Decel Time: 345 msec MV E velocity: 221.00 cm/s MV A velocity: 132.00 cm/s MV E/A ratio:  1.67 Carlyle Dolly MD Electronically  signed by Carlyle Dolly MD Signature Date/Time: 03/31/2020/6:14:00 PM    Final    DG Hip Unilat W or Wo Pelvis 2-3 Views Left  Result Date: 03/30/2020 CLINICAL DATA:  Fall EXAM: DG HIP (WITH OR WITHOUT PELVIS) 2-3V LEFT COMPARISON:  None. FINDINGS: There is no evidence of hip fracture or dislocation. Moderate left hip osteoarthritis is seen with superior joint space loss and marginal osteophyte formation. There is diffuse osteopenia. There is no evidence of arthropathy or other focal bone abnormality. Scattered vascular calcifications are noted. IMPRESSION: No acute osseous abnormality Electronically Signed   By: Prudencio Pair M.D.   On: 03/30/2020 01:50   [place] EKG SITE RITE  Result Date: 04/04/2020 If Site Rite image not attached, placement could not be confirmed due to current cardiac rhythm.   Orson Eva, DO  [facility]  If 7PM-7AM, please contact night-coverage [email or web address] Password [facility] 04/06/2020, 5:37 PM   LOS: 7 days

## 2020-04-06 NOTE — Plan of Care (Signed)

## 2020-04-06 NOTE — TOC Progression Note (Signed)
30 day Note    Patient Details  Name: Amy Wiggins MRN: [id number] Date of Birth: [birth year]  Transition of Care [facility]) CM/SW Contact  Boneta Lucks, RN Phone Number: [phone] 04/06/2020, 11:12 AM   To whom iit may concern: Please be advised that [name] will require a short-term nursing home stay, anticipated 30 days  or less rehabilitation and strengthening. The plan is for return home.

## 2020-04-07 DIAGNOSIS — R7881 Bacteremia: Secondary | ICD-10-CM | POA: Diagnosis not present

## 2020-04-07 DIAGNOSIS — E1165 Type 2 diabetes mellitus with hyperglycemia: Secondary | ICD-10-CM | POA: Diagnosis not present

## 2020-04-07 DIAGNOSIS — I5032 Chronic diastolic (congestive) heart failure: Secondary | ICD-10-CM | POA: Diagnosis not present

## 2020-04-07 DIAGNOSIS — A4181 Sepsis due to Enterococcus: Secondary | ICD-10-CM | POA: Diagnosis not present

## 2020-04-07 DIAGNOSIS — F411 Generalized anxiety disorder: Secondary | ICD-10-CM | POA: Diagnosis not present

## 2020-04-07 DIAGNOSIS — J9611 Chronic respiratory failure with hypoxia: Secondary | ICD-10-CM | POA: Diagnosis not present

## 2020-04-07 DIAGNOSIS — B952 Enterococcus as the cause of diseases classified elsewhere: Secondary | ICD-10-CM | POA: Diagnosis not present

## 2020-04-07 DIAGNOSIS — I609 Nontraumatic subarachnoid hemorrhage, unspecified: Secondary | ICD-10-CM | POA: Diagnosis not present

## 2020-04-07 DIAGNOSIS — R0602 Shortness of breath: Secondary | ICD-10-CM | POA: Diagnosis not present

## 2020-04-07 DIAGNOSIS — I5021 Acute systolic (congestive) heart failure: Secondary | ICD-10-CM | POA: Diagnosis not present

## 2020-04-07 DIAGNOSIS — A4189 Other specified sepsis: Secondary | ICD-10-CM | POA: Diagnosis not present

## 2020-04-07 DIAGNOSIS — R0902 Hypoxemia: Secondary | ICD-10-CM | POA: Diagnosis not present

## 2020-04-07 DIAGNOSIS — R531 Weakness: Secondary | ICD-10-CM | POA: Diagnosis not present

## 2020-04-07 DIAGNOSIS — Z7401 Bed confinement status: Secondary | ICD-10-CM | POA: Diagnosis not present

## 2020-04-07 DIAGNOSIS — I959 Hypotension, unspecified: Secondary | ICD-10-CM | POA: Diagnosis not present

## 2020-04-07 DIAGNOSIS — N184 Chronic kidney disease, stage 4 (severe): Secondary | ICD-10-CM | POA: Diagnosis not present

## 2020-04-07 DIAGNOSIS — I482 Chronic atrial fibrillation, unspecified: Secondary | ICD-10-CM | POA: Diagnosis not present

## 2020-04-07 DIAGNOSIS — E785 Hyperlipidemia, unspecified: Secondary | ICD-10-CM | POA: Diagnosis not present

## 2020-04-07 DIAGNOSIS — I4891 Unspecified atrial fibrillation: Secondary | ICD-10-CM | POA: Diagnosis not present

## 2020-04-07 DIAGNOSIS — A419 Sepsis, unspecified organism: Secondary | ICD-10-CM | POA: Diagnosis not present

## 2020-04-07 LAB — BASIC METABOLIC PANEL
Anion gap: 11 (ref 5–15)
BUN: 63 mg/dL — ABNORMAL HIGH (ref 8–23)
CO2: 24 mmol/L (ref 22–32)
Calcium: 9.8 mg/dL (ref 8.9–10.3)
Chloride: 105 mmol/L (ref 98–111)
Creatinine, Ser: 2.85 mg/dL — ABNORMAL HIGH (ref 0.44–1.00)
GFR, Estimated: 16 mL/min — ABNORMAL LOW (ref >60)
Glucose, Bld: 85 mg/dL (ref 70–99)
Potassium: 4.4 mmol/L (ref 3.5–5.1)
Sodium: 140 mmol/L (ref 135–145)

## 2020-04-07 LAB — GLUCOSE, CAPILLARY
Glucose-Capillary: 78 mg/dL (ref 70–99)
Glucose-Capillary: 170 mg/dL — ABNORMAL HIGH (ref 70–99)

## 2020-04-07 LAB — RESP PANEL BY RT-PCR (FLU A&B, COVID) ARPGX2
Influenza A by PCR: NEGATIVE
Influenza B by PCR: NEGATIVE
SARS Coronavirus 2 by RT PCR: NEGATIVE

## 2020-04-07 MED ORDER — SODIUM CHLORIDE 0.9 % IV SOLN: 2.0000 g | Freq: Three times a day (TID) | INTRAVENOUS | Status: AC

## 2020-04-07 MED ORDER — SODIUM CHLORIDE 0.9 % IV SOLN: 2.0000 g | Freq: Two times a day (BID) | INTRAVENOUS | Status: AC

## 2020-04-07 MED ORDER — TORSEMIDE 20 MG PO TABS
20.0000 mg | ORAL_TABLET | Freq: Every day | ORAL | 3 refills | Status: AC
Start: 2020-04-07 — End: ?

## 2020-04-07 MED ORDER — INSULIN DETEMIR 100 UNIT/ML ~~LOC~~ SOLN: 10.0000 [IU] | Freq: Every day | SUBCUTANEOUS | 0 refills | Status: AC

## 2020-04-07 NOTE — Progress Notes (Signed)
Report called and given to Marko Stai RN, [facility]. All questions were answered and no further questions at this time. Patient in stable condition and in no acute distress at time of discharge. Patient will be transported via [facility]. PICC line to stay in place for anticipated IV antibiotics.

## 2020-04-07 NOTE — Care Management Important Message (Signed)
Important Message  Patient Details  Name: Amy Wiggins MRN: [id number] Date of Birth: 05-Apr-1931   Medicare Important Message Given:  Yes     [name] 04/07/2020, 12:41 PM

## 2020-04-07 NOTE — Discharge Summary (Signed)
Physician Discharge Summary  Amy Wiggins:[id number] DOB: 05-Nov-1931 DOA: 03/29/2020  PCP: Asencion Noble, MD  Admit date: 03/29/2020 Discharge date: 04/07/2020  Admitted From: Home Disposition:  SNF  Recommendations for Outpatient Follow-up:  1. Follow up with PCP in 1-2 weeks 2. Please obtain BMP/CBC every 7 days until finished with IV antibiotics 3. Please  Remove PICC line after last dose of antibiotics on 04/29/20   Discharge Condition: Stable CODE STATUS: DNR Diet recommendation: Heart Healthy / Carb Modified   Brief/Interim Summary: 8 y/ofemale with history ofCKD 4,diabetes mellitus type 2, hypertension, hyperlipidemia, paroxysmal atrial fibrillation, stroke, PFO, dCHF, bronchiectasis, COPDpresenting with a fallPatient had her knees and hurt her left hip during the fall. CT C-spine and head showed small amount of acute subarachnoid hemorrhage in setting of trauma, left frontal scalp and supra orbital swelling and soft tissue hematoma. No acute fracture of C-spine, C-spine spondylitic changes chronic. CT lumbar shows no acute fracture or static subluxation of lumbar spine. Chest x-ray shows retrocardiac opacity that could be atelectasis versus infectious. X-ray hip shows no acute findings. Neurosurgery consulted from ED and reports patient can stay at [facility] for repeat CT which revealed stable bleed.  Repeat CT brain showed improvement of her SAH.  However, the patient was found to be bacteremic with Enterococcus faecalis.  She was initially started on vancomycin.  Once susceptibilities returned, she was changed to ampicillin and ceftriaxone.  Repeat blood cultures were obtained.  PICC line was placed.  Discharge Diagnoses:   Sepsis -present on admission -due to E faecalis bacteremia -2/5 blood cultures neg -03/31/20 Echo (TTE)--EF 65-70, no WMA, mild-mod AS, mild MR, no veg -family declined TEE -PICC line place 04/04/20  Chronic respiratory failure with hypoxia -on 2L  at home  -due to COPD and bronchiectasis  E Faecalis bacteremia -ID input noted -ceftriaxone 2g q12h ivp, ampicillin 2g q8h ivpb -stop date 04/29/20  Subarachnoid Hemorrhage -04/02/20 repeat CT shows decreased [facility] -outpatient neurosurgery follow up  chronicdiastolic CHF -clinically euvolemic -actually volume depleted -held torsemide during hospitalization -restart torsemide lower dose after dc-->20 mg daily (previously on 40 mg am, 20 mg pm) as pt has poor po intake  Atrial fibrillation with RVR, paroxysmal -CHADSVASc = 7 -discontinue apixabanin setting of SAH -high risk falls -continue diltiazem and amio  CKD stage4 -Baseline creatinine2.7-2.9 -A.m. BMP -not a HD candidate  Uncontrolled diabetes mellitus type 2 with hyperglycemia -6/5 holding lantus and ISS temporarily due to hypoglycemia -5/30/21hemoglobin A1c 8.4 -03/31/20 A1C--6.9 -restart lower dose levemir as po intake is poor currently -allow more liberal glycemic control at this point  Depression/anxiety -Continue Celexa -mirtazepine to help with apetite  Hyperlipidemia -Continue statin  Hypernatremia -improved with hypotonic fluid  Goals of Care -palliative followed patient -had [place] discussion with granddaughters(POA) 2/9 -DNR -plan for SNF Advance care planning, including the explanation and discussion of advance directives was carried out with the patient and family. Code status including explanations of "Full Code" and "DNR" and alternatives were discussed in detail. Discussion of end-of-life issues including but not limited palliative care, hospice care and the concept of hospice, other end-of-life care options, power of attorney for health care decisions, living wills, and physician orders for life-sustaining treatment were also discussed with the patient and family. Total face to face time 16 minutes.     Discharge Instructions   Allergies as of 04/07/2020   No Known  Allergies     Medication List    STOP taking these medications   acetaminophen  500 MG tablet Commonly known as: TYLENOL   Eliquis 2.5 MG Tabs tablet Generic drug: apixaban   insulin glargine 100 UNIT/ML injection Commonly known as: Lantus   Magnesium Oxide 250 MG Tabs   potassium chloride SA 20 MEQ tablet Commonly known as: KLOR-CON   traZODone 150 MG tablet Commonly known as: DESYREL     TAKE these medications   amiodarone 200 MG tablet Commonly known as: PACERONE Take 1 tablet (200 mg total) by mouth daily.   ampicillin 2 g in sodium chloride 0.9 % 100 mL Inject 2 g into the vein every 8 (eight) hours. Stop date 04/29/20   atorvastatin 20 MG tablet Commonly known as: LIPITOR Take 20 mg by mouth daily.   buPROPion 300 MG 24 hr tablet Commonly known as: WELLBUTRIN XL Take 1 tablet (300 mg total) by mouth daily.   cefTRIAXone 2 g in sodium chloride 0.9 % 100 mL Inject 2 g into the vein every 12 (twelve) hours. Stop date 04/29/20   citalopram 20 MG tablet Commonly known as: CELEXA Take 1 tablet (20 mg total) by mouth daily.   diltiazem 120 MG 24 hr capsule Commonly known as: CARDIZEM CD Take 1 capsule (120 mg total) by mouth daily.   Droplet Pen Needles 32G X 4 MM Misc Generic drug: Insulin Pen Needle   insulin detemir 100 UNIT/ML injection Commonly known as: LEVEMIR Inject 0.1 mLs (10 Units total) into the skin at bedtime.   ipratropium-albuterol 0.5-2.5 (3) MG/3ML Soln Commonly known as: DUONEB Take 3 mLs by nebulization every 6 (six) hours.   mirtazapine 15 MG tablet Commonly known as: REMERON Take 15 mg by mouth at bedtime.   multivitamin with minerals Tabs tablet Take 1 tablet by mouth at bedtime. For supplement   NovoLOG FlexPen 100 UNIT/ML FlexPen Generic drug: insulin aspart Inject 10 Units into the skin 3 (three) times daily with meals. insulin pen; 100 unit/mL (3 mL); amt: 10 units; subcutaneous Special Instructions: admin 5 units for CBG  >150 Before Meals 08:00 AM, 11:30 AM, 05:00 PM   OXYGEN Inhale 2 L into the lungs continuous.   pantoprazole 40 MG tablet Commonly known as: PROTONIX Take 1 tablet (40 mg total) by mouth daily. For acid reflux/gerd   PreserVision AREDS 2 Caps Take 1 capsule by mouth 2 (two) times daily.   torsemide 20 MG tablet Commonly known as: DEMADEX Take 1 tablet (20 mg total) by mouth daily. Take 40mg  in am and 20mg  in the evening. You may take an extra tablet as needed What changed:   how much to take  how to take this  when to take this   [place]       Contact information for follow-up providers    [facility], [facility] Follow up.   Specialty: Home Health Services Why: Will contact you to schedule home health visits.  Contact information: [place] [address] [phone]            Contact information for after-discharge care    Destination    [facility]erred [facility] .   Service: Skilled Nursing Contact information: [address] [place] [phone]                 No Known Allergies  Consultations:  palliative   Procedures/Studies: DG Chest 1 View  Result Date: 03/30/2020 CLINICAL DATA:  Fall with  fever EXAM: CHEST  1 VIEW COMPARISON:  None. FINDINGS: The heart size and mediastinal contours are mildly enlarged . Aortic knob calcifications are seen. Patchy retrocardiac airspace opacity is noted with air bronchograms. The visualized skeletal structures are unremarkable. IMPRESSION: Retrocardiac opacity which could be atelectasis and/or infectious etiology Electronically Signed   By: Prudencio Pair M.D.   On: 03/30/2020 01:52   CT HEAD WO CONTRAST  Result Date: 04/02/2020 CLINICAL DATA:  87 year old female with headache. Status post fall with trace subarachnoid hemorrhage. EXAM: CT HEAD WITHOUT CONTRAST  TECHNIQUE: Contiguous axial images were obtained from the base of the skull through the vertex without intravenous contrast. COMPARISON:  Head CTs 03/30/2020 and earlier. FINDINGS: Brain: Partially resolved now subtle subarachnoid hemorrhage at the superior central sulcus (series 2, image 28). No intraventricular blood or ventriculomegaly. No new intracranial hemorrhage identified. No midline shift, mass effect, or evidence of intracranial mass lesion. Stable encephalomalacia right superior perirolandic cortex, bilateral occipital lobes, left thalamus, left cerebellum. No cortically based acute infarct identified. Vascular: Calcified atherosclerosis at the skull base. No suspicious intracranial vascular hyperdensity. Skull: Stable.  Hyperostosis.  No skull fracture identified. Sinuses/Orbits: Increasing sphenoid and right frontal sinus mucosal thickening with small volume sinus fluid. Tympanic cavities and mastoids remain clear. Other: Calcified scalp vessel atherosclerosis. Small left vertex scalp hematoma. Orbits soft tissues are stable. IMPRESSION: 1. Decreasing subarachnoid hemorrhage at the left superior convexity with only subtle residual. 2. No new intracranial abnormality. Stable multifocal chronic infarcts. 3. Small left vertex scalp hematoma without underlying skull fracture. Electronically Signed   By: Genevie Ann M.D.   On: 04/02/2020 07:11   CT HEAD WO CONTRAST  Result Date: 03/30/2020 CLINICAL DATA:  Follow-up subarachnoid hemorrhage. EXAM: CT HEAD WITHOUT CONTRAST TECHNIQUE: Contiguous axial images were obtained from the base of the skull through the vertex without intravenous contrast. COMPARISON:  Same day head CT at 0140 hours. FINDINGS: Brain: Similar solid amount of subarachnoid hemorrhage seen along the surface of the precentral gyrus (2/26). No new areas of intra or extra axial hemorrhage visualized. Again seen is the remote cortically based infarct in the right precentral gyrus.  Encephalomalacia again seen in the bilateral occipital lobes and cerebellar hemispheres. Patchy areas of white matter hypoattenuation most compatible with chronic microvascular angiopathy. No evidence of acute infarction, hydrocephalus mass lesion or mass effect. Similar mild degree symmetric ex vacuo dilatation of the ventricular system with commensurate parenchymal volume loss. Vascular: No hyperdense vessel or unexpected calcification. Atherosclerotic calcifications of the carotid siphons and vertebral arteries. Skull: No significant change in the left frontal scalp and supraorbital soft tissue swelling with extracalvarial hematoma. Hyperostosis frontalis interna. Sinuses/Orbits: Mild scattered mucosal thickening of the paranasal sinuses. Prior bilateral lens surgery. Similar asymmetric left periorbital and palpebral swelling without retro septal involvement. Other: None IMPRESSION: 1. Similar solid amount of subarachnoid hemorrhage along the surface of the left precentral gyrus. No new areas of intra or extra axial hemorrhage visualized. 2. No significant change in the left frontal scalp and supraorbital soft tissue swelling with extracalvarial hematoma. 3. Similar asymmetric left periorbital and palpebral swelling without retro septal involvement. Electronically Signed   By: Dahlia Bailiff MD   On: 03/30/2020 16:50   CT Head Wo Contrast  Result Date: 03/30/2020 CLINICAL DATA:  Fall, fever EXAM: CT HEAD WITHOUT CONTRAST CT CERVICAL SPINE WITHOUT CONTRAST TECHNIQUE: Multidetector CT imaging of the head and cervical spine was performed following the standard protocol without intravenous contrast. Multiplanar CT image reconstructions of the cervical  spine were also generated. COMPARISON:  MR 02/22/2016, CT 02/21/2016, MR neck 06/02/2015 FINDINGS: CT HEAD FINDINGS Brain: Curvilinear hyperattenuation is seen along the surface of the precentral gyrus (2/24) concerning for a small amount of acute subarachnoid  hemorrhage. No other sites of extra-axial or parenchymal hemorrhage. 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 Additional regions of encephalomalacia is seen in the bilateral occipital lobes and cerebellar hemispheres. No evidence of acute infarction, hydrocephalus, visible mass lesion or mass effect. Symmetric prominence of the ventricles, cisterns and sulci compatible with parenchymal volume loss. Patchy areas of white matter hypoattenuation are most compatible with chronic microvascular angiopathy. Vascular: Atherosclerotic calcification of the carotid siphons and intradural vertebral arteries. No hyperdense vessel. Skull: Left frontal scalp and supraorbital soft tissue swelling with crescentic scalp hematoma measuring up to 6 mm in maximal thickness. No other significant sites of scalp swelling. No subjacent calvarial fracture or visible facial bone fracture is evident within the included margins of imaging. Hyperostosis frontalis interna is a benign incidental finding. Sinuses/Orbits: Mild nodular mural thickening in the paranasal sinuses. No layering air-fluid levels or pneumatized secretions. Prior bilateral lens extractions. Asymmetric left periorbital and palpebral swelling without retro septal gas, stranding or hemorrhage. Remaining orbital contents are unremarkable. Other: None CT CERVICAL SPINE FINDINGS Alignment: Stabilization collar is absent at the time of exam. Mild straightening of normal cervical lordosis. Light stepwise anterolisthesis C2-C5 and minimal 2 mm of anterolisthesis C6 on C7 favored to be on a spondylitic basis given significant facet arthropathy and uncinate changes. No evidence of traumatic listhesis. No abnormally widened, perched or jumped facets. Normal alignment of the craniocervical and atlantoaxial articulations. Skull base and vertebrae: No acute skull base fracture. No vertebral body fracture or height loss. The osseous structures appear diffusely  demineralized which may limit detection of small or nondisplaced fractures. No worrisome osseous lesions. Moderate arthrosis at the atlantodental and basion dens intervals with few small erosions and calcific pannus formation, nonspecific but can be seen with CPPD or rheumatoid arthropathy. Multilevel cervical spondylitic changes as detailed below. Enthesopathic changes noted along the spinous processes and nuchal ligament. Soft tissues and spinal canal: No pre or paravertebral fluid or swelling. No visible canal hematoma. Cervical carotid atherosclerosis. Airways patent. Disc levels: Multilevel intervertebral disc height loss with spondylitic endplate changes. Larger disc osteophyte complexes are present C3-C5 with some partial effacement of the ventral thecal sac. Additional disc osteophyte complex C6-5-C6 may result in some mild canal stenosis in combination with posterior ligamentum flavum infolding. Multilevel uncinate spurring and facet hypertrophic changes are present throughout the cervical spine as well resulting in mild-to-moderate multilevel neural foraminal narrowing most pronounced at the C3-4 level bilaterally. Upper chest: Atelectatic changes in the lung apices. Calcification of proximal great vessels noted incidentally. Other: No concerning thyroid nodules or masses. IMPRESSION: 1. Curvilinear hyperattenuation along the surface of the left precentral gyrus concerning for a small amount of acute subarachnoid hemorrhage in the setting of trauma. No other acute intracranial abnormalities. 2. Left frontal scalp and supraorbital swelling and soft tissue hematoma. No subjacent calvarial or visible facial bone fracture. No orbital injury is identified. 3. No acute fracture or traumatic listhesis of the cervical spine. 4. 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 Additional regions of encephalomalacia in the bilateral occipital lobes and cerebellar hemispheres. 5. Multilevel cervical  spondylitic changes, as above. 6. Moderate arthrosis at the atlantodental and basion dens intervals with erosions and calcific pannus formation, nonspecific but can be seen with  CPPD or rheumatoid arthropathy. 7. Intracranial and cervical atherosclerosis. Critical Value/emergent results were called by telephone at the time of interpretation on 03/30/2020 at 2:24 am to provider Veryl Speak , who verbally acknowledged these results. Electronically Signed   By: Lovena Le M.D.   On: 03/30/2020 02:23   CT Cervical Spine Wo Contrast  Result Date: 03/30/2020 CLINICAL DATA:  Fall, fever EXAM: CT HEAD WITHOUT CONTRAST CT CERVICAL SPINE WITHOUT CONTRAST TECHNIQUE: Multidetector CT imaging of the head and cervical spine was performed following the standard protocol without intravenous contrast. Multiplanar CT image reconstructions of the cervical spine were also generated. COMPARISON:  MR 02/22/2016, CT 02/21/2016, MR neck 06/02/2015 FINDINGS: CT HEAD FINDINGS Brain: Curvilinear hyperattenuation is seen along the surface of the precentral gyrus (2/24) concerning for a small amount of acute subarachnoid hemorrhage. No other sites of extra-axial or parenchymal hemorrhage. 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 Additional regions of encephalomalacia is seen in the bilateral occipital lobes and cerebellar hemispheres. No evidence of acute infarction, hydrocephalus, visible mass lesion or mass effect. Symmetric prominence of the ventricles, cisterns and sulci compatible with parenchymal volume loss. Patchy areas of white matter hypoattenuation are most compatible with chronic microvascular angiopathy. Vascular: Atherosclerotic calcification of the carotid siphons and intradural vertebral arteries. No hyperdense vessel. Skull: Left frontal scalp and supraorbital soft tissue swelling with crescentic scalp hematoma measuring up to 6 mm in maximal thickness. No other significant sites of scalp  swelling. No subjacent calvarial fracture or visible facial bone fracture is evident within the included margins of imaging. Hyperostosis frontalis interna is a benign incidental finding. Sinuses/Orbits: Mild nodular mural thickening in the paranasal sinuses. No layering air-fluid levels or pneumatized secretions. Prior bilateral lens extractions. Asymmetric left periorbital and palpebral swelling without retro septal gas, stranding or hemorrhage. Remaining orbital contents are unremarkable. Other: None CT CERVICAL SPINE FINDINGS Alignment: Stabilization collar is absent at the time of exam. Mild straightening of normal cervical lordosis. Light stepwise anterolisthesis C2-C5 and minimal 2 mm of anterolisthesis C6 on C7 favored to be on a spondylitic basis given significant facet arthropathy and uncinate changes. No evidence of traumatic listhesis. No abnormally widened, perched or jumped facets. Normal alignment of the craniocervical and atlantoaxial articulations. Skull base and vertebrae: No acute skull base fracture. No vertebral body fracture or height loss. The osseous structures appear diffusely demineralized which may limit detection of small or nondisplaced fractures. No worrisome osseous lesions. Moderate arthrosis at the atlantodental and basion dens intervals with few small erosions and calcific pannus formation, nonspecific but can be seen with CPPD or rheumatoid arthropathy. Multilevel cervical spondylitic changes as detailed below. Enthesopathic changes noted along the spinous processes and nuchal ligament. Soft tissues and spinal canal: No pre or paravertebral fluid or swelling. No visible canal hematoma. Cervical carotid atherosclerosis. Airways patent. Disc levels: Multilevel intervertebral disc height loss with spondylitic endplate changes. Larger disc osteophyte complexes are present C3-C5 with some partial effacement of the ventral thecal sac. Additional disc osteophyte complex C6-5-C6 may result  in some mild canal stenosis in combination with posterior ligamentum flavum infolding. Multilevel uncinate spurring and facet hypertrophic changes are present throughout the cervical spine as well resulting in mild-to-moderate multilevel neural foraminal narrowing most pronounced at the C3-4 level bilaterally. Upper chest: Atelectatic changes in the lung apices. Calcification of proximal great vessels noted incidentally. Other: No concerning thyroid nodules or masses. IMPRESSION: 1. Curvilinear hyperattenuation along the surface of the left precentral gyrus concerning for a small  amount of acute subarachnoid hemorrhage in the setting of trauma. No other acute intracranial abnormalities. 2. Left frontal scalp and supraorbital swelling and soft tissue hematoma. No subjacent calvarial or visible facial bone fracture. No orbital injury is identified. 3. No acute fracture or traumatic listhesis of the cervical spine. 4. 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 Additional regions of encephalomalacia in the bilateral occipital lobes and cerebellar hemispheres. 5. Multilevel cervical spondylitic changes, as above. 6. Moderate arthrosis at the atlantodental and basion dens intervals with erosions and calcific pannus formation, nonspecific but can be seen with CPPD or rheumatoid arthropathy. 7. Intracranial and cervical atherosclerosis. Critical Value/emergent results were called by telephone at the time of interpretation on 03/30/2020 at 2:24 am to provider Veryl Speak , who verbally acknowledged these results. Electronically Signed   By: Lovena Le M.D.   On: 03/30/2020 02:23   CT Lumbar Spine Wo Contrast  Result Date: 03/30/2020 CLINICAL DATA:  Trauma and low back pain.  Fall.  Fever. EXAM: CT LUMBAR SPINE WITHOUT CONTRAST TECHNIQUE: Multidetector CT imaging of the lumbar spine was performed without intravenous contrast administration. Multiplanar CT image reconstructions were also generated.  COMPARISON:  None. FINDINGS: Segmentation: Standard Alignment: Normal Vertebrae: No acute fracture or focal pathologic process. Paraspinal and other soft tissues: Calcific aortic atherosclerosis. Cholelithiasis. Disc levels: Multilevel degenerative disc disease without high-grade spinal canal or neural foraminal stenosis. Degenerative changes are greatest at L4-5 and L5-S1. IMPRESSION: 1. No acute fracture or static subluxation of the lumbar spine. 2. Aortic Atherosclerosis (ICD10-I70.0). Electronically Signed   By: Ulyses Jarred M.D.   On: 03/30/2020 02:13   DG CHEST PORT 1 VIEW  Result Date: 04/03/2020 CLINICAL DATA:  Fall 03/29/2020. EXAM: PORTABLE CHEST 1 VIEW COMPARISON:  03/30/2020 FINDINGS: Interval development of small left effusion and left lower lobe atelectasis. No displaced rib fracture identified on the left. Heart size upper normal.  Negative for heart failure or edema. IMPRESSION: Small left effusion and left lower lobe airspace disease. No displaced rib fracture identified on the left. Electronically Signed   By: Franchot Gallo M.D.   On: 04/03/2020 11:29   ECHOCARDIOGRAM COMPLETE  Result Date: 03/31/2020    ECHOCARDIOGRAM REPORT   Patient Name:   Amy Wiggins Date of Exam: 03/31/2020 Medical Rec #:  [id number]         Height:       64.0 in Accession #:    [id number]        Weight:       183.2 lb Date of Birth:  February 15, 1932        BSA:          1.885 m Patient Age:    27 years          BP:           118/47 mmHg Patient Gender: F                 HR:           87 bpm. Exam Location:  [facility] Procedure: 2D Echo, Cardiac Doppler and Color Doppler Indications:    Bacteremia R78.81  History:        Patient has prior history of Echocardiogram examinations, most                 recent 07/28/2019. Stroke, Arrythmias:Atrial Fibrillation; Risk                 Factors:Hypertension, Diabetes and Dyslipidemia. CKD (  chronic                 kidney disease) stage 4, GFR 15-29 ml/min.  Sonographer:    Alvino Chapel RCS Referring Phys: [place]  1. Left ventricular ejection fraction, by estimation, is 65 to 70%. The left ventricle has normal function. The left ventricle has no regional wall motion abnormalities. There is moderate left ventricular hypertrophy. Left ventricular diastolic parameters are indeterminate.  2. Right ventricular systolic function is normal. The right ventricular size is normal.  3. Left atrial size was severely dilated.  4. Right atrial size was mildly dilated.  5. The mitral valve is abnormal. Mild mitral valve regurgitation. Severe mitral stenosis. Severe mitral annular calcification.  6. The aortic valve has an indeterminant number of cusps. There is severe calcifcation of the aortic valve. There is severe thickening of the aortic valve. Aortic valve regurgitation is not visualized. Mild to moderate aortic valve stenosis.  7. The inferior vena cava is normal in size with greater than 50% respiratory variability, suggesting right atrial pressure of 3 mmHg. FINDINGS  Left Ventricle: Left ventricular ejection fraction, by estimation, is 65 to 70%. The left ventricle has normal function. The left ventricle has no regional wall motion abnormalities. The left ventricular internal cavity size was normal in size. There is  moderate left ventricular hypertrophy. Left ventricular diastolic parameters are indeterminate. Right Ventricle: The right ventricular size is normal. No increase in right ventricular wall thickness. Right ventricular systolic function is normal. Left Atrium: Left atrial size was severely dilated. Right Atrium: Right atrial size was mildly dilated. Pericardium: There is no evidence of pericardial effusion. Mitral Valve: The mitral valve is abnormal. Severe mitral annular calcification. Mild mitral valve regurgitation. Severe mitral valve stenosis. MV peak gradient, 39.7 mmHg. The mean mitral valve gradient is 13.0 mmHg. Tricuspid Valve: The tricuspid valve  is normal in structure. Tricuspid valve regurgitation is mild . No evidence of tricuspid stenosis. Aortic Valve: The aortic valve has an indeterminant number of cusps. There is severe calcifcation of the aortic valve. There is severe thickening of the aortic valve. There is severe aortic valve annular calcification. Aortic valve regurgitation is not visualized. Mild to moderate aortic stenosis is present. Aortic valve mean gradient measures 12.0 mmHg. Aortic valve peak gradient measures 23.0 mmHg. Aortic valve area, by VTI measures 1.36 cm. Pulmonic Valve: The pulmonic valve was not well visualized. Pulmonic valve regurgitation is not visualized. No evidence of pulmonic stenosis. Aorta: The aortic root is normal in size and structure. Pulmonary Artery: Indeterminant PASP, inadequate TR jet. Venous: The inferior vena cava is normal in size with greater than 50% respiratory variability, suggesting right atrial pressure of 3 mmHg. IAS/Shunts: No atrial level shunt detected by color flow Doppler.  LEFT VENTRICLE PLAX 2D LVIDd:         4.60 cm  Diastology LVIDs:         2.70 cm  LV e' medial:    5.22 cm/s LV PW:         1.40 cm  LV E/e' medial:  42.3 LV IVS:        1.20 cm  LV e' lateral:   9.14 cm/s LVOT diam:     1.90 cm  LV E/e' lateral: 24.2 LV SV:         67 LV SV Index:   36 LVOT Area:     2.84 cm  RIGHT VENTRICLE RV S prime:  15.00 cm/s TAPSE (M-mode): 2.9 cm LEFT ATRIUM              Index       RIGHT ATRIUM           Index LA diam:        4.10 cm  2.18 cm/m  RA Area:     18.40 cm LA Vol (A2C):   132.0 ml 70.03 ml/m RA Volume:   48.00 ml  25.47 ml/m LA Vol (A4C):   111.0 ml 58.89 ml/m LA Biplane Vol: 126.0 ml 66.85 ml/m  AORTIC VALVE AV Area (Vmax):    1.33 cm AV Area (Vmean):   1.61 cm AV Area (VTI):     1.36 cm AV Vmax:           240.00 cm/s AV Vmean:          150.000 cm/s AV VTI:            0.493 m AV Peak Grad:      23.0 mmHg AV Mean Grad:      12.0 mmHg LVOT Vmax:         113.00 cm/s LVOT Vmean:         85.100 cm/s LVOT VTI:          0.236 m LVOT/AV VTI ratio: 0.48  AORTA Ao Root diam: 3.10 cm MITRAL VALVE MV Area (PHT): 2.20 cm     SHUNTS MV Area VTI:   0.66 cm     Systemic VTI:  0.24 m MV Peak grad:  39.7 mmHg    Systemic Diam: 1.90 cm MV Mean grad:  13.0 mmHg MV Vmax:       3.15 m/s MV Vmean:      147.0 cm/s MV Decel Time: 345 msec MV E velocity: 221.00 cm/s MV A velocity: 132.00 cm/s MV E/A ratio:  1.67 Carlyle Dolly MD Electronically signed by Carlyle Dolly MD Signature Date/Time: 03/31/2020/6:14:00 PM    Final    DG Hip Unilat W or Wo Pelvis 2-3 Views Left  Result Date: 03/30/2020 CLINICAL DATA:  Fall EXAM: DG HIP (WITH OR WITHOUT PELVIS) 2-3V LEFT COMPARISON:  None. FINDINGS: There is no evidence of hip fracture or dislocation. Moderate left hip osteoarthritis is seen with superior joint space loss and marginal osteophyte formation. There is diffuse osteopenia. There is no evidence of arthropathy or other focal bone abnormality. Scattered vascular calcifications are noted. IMPRESSION: No acute osseous abnormality Electronically Signed   By: Prudencio Pair M.D.   On: 03/30/2020 01:50   [place] EKG SITE RITE  Result Date: 04/04/2020 If Site Rite image not attached, placement could not be confirmed due to current cardiac rhythm.       Discharge Exam: Vitals:   04/07/20 0500 04/07/20 0817  BP: (!) 152/51   Pulse: 83   Resp:    Temp: 97.8 F (36.6 C)   SpO2: 97% 98%   Vitals:   04/07/20 0259 04/07/20 0339 04/07/20 0500 04/07/20 0817  BP: (!) 143/49  (!) 152/51   Pulse: 73  83   Resp: 16     Temp: (!) 88.8 F (31.6 C) (!) 97.5 F (36.4 C) 97.8 F (36.6 C)   TempSrc: Oral Axillary Oral   SpO2: 100%  97% 98%  Weight:      Height:        General: Pt is alert, awake, not in acute distress Cardiovascular: RRR, S1/S2 +, no rubs, no gallops Respiratory: bilateral scattered rales. No wheeze  Abdominal: Soft, NT, ND, bowel sounds + Extremities: no edema, no cyanosis   The  results of significant diagnostics from this hospitalization (including imaging, microbiology, ancillary and laboratory) are listed below for reference.    Significant Diagnostic Studies: DG Chest 1 View  Result Date: 03/30/2020 CLINICAL DATA:  Fall with fever EXAM: CHEST  1 VIEW COMPARISON:  None. FINDINGS: The heart size and mediastinal contours are mildly enlarged . Aortic knob calcifications are seen. Patchy retrocardiac airspace opacity is noted with air bronchograms. The visualized skeletal structures are unremarkable. IMPRESSION: Retrocardiac opacity which could be atelectasis and/or infectious etiology Electronically Signed   By: Prudencio Pair M.D.   On: 03/30/2020 01:52   CT HEAD WO CONTRAST  Result Date: 04/02/2020 CLINICAL DATA:  87 year old female with headache. Status post fall with trace subarachnoid hemorrhage. EXAM: CT HEAD WITHOUT CONTRAST TECHNIQUE: Contiguous axial images were obtained from the base of the skull through the vertex without intravenous contrast. COMPARISON:  Head CTs 03/30/2020 and earlier. FINDINGS: Brain: Partially resolved now subtle subarachnoid hemorrhage at the superior central sulcus (series 2, image 28). No intraventricular blood or ventriculomegaly. No new intracranial hemorrhage identified. No midline shift, mass effect, or evidence of intracranial mass lesion. Stable encephalomalacia right superior perirolandic cortex, bilateral occipital lobes, left thalamus, left cerebellum. No cortically based acute infarct identified. Vascular: Calcified atherosclerosis at the skull base. No suspicious intracranial vascular hyperdensity. Skull: Stable.  Hyperostosis.  No skull fracture identified. Sinuses/Orbits: Increasing sphenoid and right frontal sinus mucosal thickening with small volume sinus fluid. Tympanic cavities and mastoids remain clear. Other: Calcified scalp vessel atherosclerosis. Small left vertex scalp hematoma. Orbits soft tissues are stable. IMPRESSION: 1.  Decreasing subarachnoid hemorrhage at the left superior convexity with only subtle residual. 2. No new intracranial abnormality. Stable multifocal chronic infarcts. 3. Small left vertex scalp hematoma without underlying skull fracture. Electronically Signed   By: Genevie Ann M.D.   On: 04/02/2020 07:11   CT HEAD WO CONTRAST  Result Date: 03/30/2020 CLINICAL DATA:  Follow-up subarachnoid hemorrhage. EXAM: CT HEAD WITHOUT CONTRAST TECHNIQUE: Contiguous axial images were obtained from the base of the skull through the vertex without intravenous contrast. COMPARISON:  Same day head CT at 0140 hours. FINDINGS: Brain: Similar solid amount of subarachnoid hemorrhage seen along the surface of the precentral gyrus (2/26). No new areas of intra or extra axial hemorrhage visualized. Again seen is the remote cortically based infarct in the right precentral gyrus. Encephalomalacia again seen in the bilateral occipital lobes and cerebellar hemispheres. Patchy areas of white matter hypoattenuation most compatible with chronic microvascular angiopathy. No evidence of acute infarction, hydrocephalus mass lesion or mass effect. Similar mild degree symmetric ex vacuo dilatation of the ventricular system with commensurate parenchymal volume loss. Vascular: No hyperdense vessel or unexpected calcification. Atherosclerotic calcifications of the carotid siphons and vertebral arteries. Skull: No significant change in the left frontal scalp and supraorbital soft tissue swelling with extracalvarial hematoma. Hyperostosis frontalis interna. Sinuses/Orbits: Mild scattered mucosal thickening of the paranasal sinuses. Prior bilateral lens surgery. Similar asymmetric left periorbital and palpebral swelling without retro septal involvement. Other: None IMPRESSION: 1. Similar solid amount of subarachnoid hemorrhage along the surface of the left precentral gyrus. No new areas of intra or extra axial hemorrhage visualized. 2. No significant change in  the left frontal scalp and supraorbital soft tissue swelling with extracalvarial hematoma. 3. Similar asymmetric left periorbital and palpebral swelling without retro septal involvement. Electronically Signed   By: Dahlia Bailiff MD   On:  03/30/2020 16:50   CT Head Wo Contrast  Result Date: 03/30/2020 CLINICAL DATA:  Fall, fever EXAM: CT HEAD WITHOUT CONTRAST CT CERVICAL SPINE WITHOUT CONTRAST TECHNIQUE: Multidetector CT imaging of the head and cervical spine was performed following the standard protocol without intravenous contrast. Multiplanar CT image reconstructions of the cervical spine were also generated. COMPARISON:  MR 02/22/2016, CT 02/21/2016, MR neck 06/02/2015 FINDINGS: CT HEAD FINDINGS Brain: Curvilinear hyperattenuation is seen along the surface of the precentral gyrus (2/24) concerning for a small amount of acute subarachnoid hemorrhage. No other sites of extra-axial or parenchymal hemorrhage. 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 Additional regions of encephalomalacia is seen in the bilateral occipital lobes and cerebellar hemispheres. No evidence of acute infarction, hydrocephalus, visible mass lesion or mass effect. Symmetric prominence of the ventricles, cisterns and sulci compatible with parenchymal volume loss. Patchy areas of white matter hypoattenuation are most compatible with chronic microvascular angiopathy. Vascular: Atherosclerotic calcification of the carotid siphons and intradural vertebral arteries. No hyperdense vessel. Skull: Left frontal scalp and supraorbital soft tissue swelling with crescentic scalp hematoma measuring up to 6 mm in maximal thickness. No other significant sites of scalp swelling. No subjacent calvarial fracture or visible facial bone fracture is evident within the included margins of imaging. Hyperostosis frontalis interna is a benign incidental finding. Sinuses/Orbits: Mild nodular mural thickening in the paranasal sinuses. No  layering air-fluid levels or pneumatized secretions. Prior bilateral lens extractions. Asymmetric left periorbital and palpebral swelling without retro septal gas, stranding or hemorrhage. Remaining orbital contents are unremarkable. Other: None CT CERVICAL SPINE FINDINGS Alignment: Stabilization collar is absent at the time of exam. Mild straightening of normal cervical lordosis. Light stepwise anterolisthesis C2-C5 and minimal 2 mm of anterolisthesis C6 on C7 favored to be on a spondylitic basis given significant facet arthropathy and uncinate changes. No evidence of traumatic listhesis. No abnormally widened, perched or jumped facets. Normal alignment of the craniocervical and atlantoaxial articulations. Skull base and vertebrae: No acute skull base fracture. No vertebral body fracture or height loss. The osseous structures appear diffusely demineralized which may limit detection of small or nondisplaced fractures. No worrisome osseous lesions. Moderate arthrosis at the atlantodental and basion dens intervals with few small erosions and calcific pannus formation, nonspecific but can be seen with CPPD or rheumatoid arthropathy. Multilevel cervical spondylitic changes as detailed below. Enthesopathic changes noted along the spinous processes and nuchal ligament. Soft tissues and spinal canal: No pre or paravertebral fluid or swelling. No visible canal hematoma. Cervical carotid atherosclerosis. Airways patent. Disc levels: Multilevel intervertebral disc height loss with spondylitic endplate changes. Larger disc osteophyte complexes are present C3-C5 with some partial effacement of the ventral thecal sac. Additional disc osteophyte complex C6-5-C6 may result in some mild canal stenosis in combination with posterior ligamentum flavum infolding. Multilevel uncinate spurring and facet hypertrophic changes are present throughout the cervical spine as well resulting in mild-to-moderate multilevel neural foraminal  narrowing most pronounced at the C3-4 level bilaterally. Upper chest: Atelectatic changes in the lung apices. Calcification of proximal great vessels noted incidentally. Other: No concerning thyroid nodules or masses. IMPRESSION: 1. Curvilinear hyperattenuation along the surface of the left precentral gyrus concerning for a small amount of acute subarachnoid hemorrhage in the setting of trauma. No other acute intracranial abnormalities. 2. Left frontal scalp and supraorbital swelling and soft tissue hematoma. No subjacent calvarial or visible facial bone fracture. No orbital injury is identified. 3. No acute fracture or traumatic listhesis of the cervical  spine. 4. 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 Additional regions of encephalomalacia in the bilateral occipital lobes and cerebellar hemispheres. 5. Multilevel cervical spondylitic changes, as above. 6. Moderate arthrosis at the atlantodental and basion dens intervals with erosions and calcific pannus formation, nonspecific but can be seen with CPPD or rheumatoid arthropathy. 7. Intracranial and cervical atherosclerosis. Critical Value/emergent results were called by telephone at the time of interpretation on 03/30/2020 at 2:24 am to provider Veryl Speak , who verbally acknowledged these results. Electronically Signed   By: Lovena Le M.D.   On: 03/30/2020 02:23   CT Cervical Spine Wo Contrast  Result Date: 03/30/2020 CLINICAL DATA:  Fall, fever EXAM: CT HEAD WITHOUT CONTRAST CT CERVICAL SPINE WITHOUT CONTRAST TECHNIQUE: Multidetector CT imaging of the head and cervical spine was performed following the standard protocol without intravenous contrast. Multiplanar CT image reconstructions of the cervical spine were also generated. COMPARISON:  MR 02/22/2016, CT 02/21/2016, MR neck 06/02/2015 FINDINGS: CT HEAD FINDINGS Brain: Curvilinear hyperattenuation is seen along the surface of the precentral gyrus (2/24) concerning for a small  amount of acute subarachnoid hemorrhage. No other sites of extra-axial or parenchymal hemorrhage. 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 Additional regions of encephalomalacia is seen in the bilateral occipital lobes and cerebellar hemispheres. No evidence of acute infarction, hydrocephalus, visible mass lesion or mass effect. Symmetric prominence of the ventricles, cisterns and sulci compatible with parenchymal volume loss. Patchy areas of white matter hypoattenuation are most compatible with chronic microvascular angiopathy. Vascular: Atherosclerotic calcification of the carotid siphons and intradural vertebral arteries. No hyperdense vessel. Skull: Left frontal scalp and supraorbital soft tissue swelling with crescentic scalp hematoma measuring up to 6 mm in maximal thickness. No other significant sites of scalp swelling. No subjacent calvarial fracture or visible facial bone fracture is evident within the included margins of imaging. Hyperostosis frontalis interna is a benign incidental finding. Sinuses/Orbits: Mild nodular mural thickening in the paranasal sinuses. No layering air-fluid levels or pneumatized secretions. Prior bilateral lens extractions. Asymmetric left periorbital and palpebral swelling without retro septal gas, stranding or hemorrhage. Remaining orbital contents are unremarkable. Other: None CT CERVICAL SPINE FINDINGS Alignment: Stabilization collar is absent at the time of exam. Mild straightening of normal cervical lordosis. Light stepwise anterolisthesis C2-C5 and minimal 2 mm of anterolisthesis C6 on C7 favored to be on a spondylitic basis given significant facet arthropathy and uncinate changes. No evidence of traumatic listhesis. No abnormally widened, perched or jumped facets. Normal alignment of the craniocervical and atlantoaxial articulations. Skull base and vertebrae: No acute skull base fracture. No vertebral body fracture or height loss. The osseous  structures appear diffusely demineralized which may limit detection of small or nondisplaced fractures. No worrisome osseous lesions. Moderate arthrosis at the atlantodental and basion dens intervals with few small erosions and calcific pannus formation, nonspecific but can be seen with CPPD or rheumatoid arthropathy. Multilevel cervical spondylitic changes as detailed below. Enthesopathic changes noted along the spinous processes and nuchal ligament. Soft tissues and spinal canal: No pre or paravertebral fluid or swelling. No visible canal hematoma. Cervical carotid atherosclerosis. Airways patent. Disc levels: Multilevel intervertebral disc height loss with spondylitic endplate changes. Larger disc osteophyte complexes are present C3-C5 with some partial effacement of the ventral thecal sac. Additional disc osteophyte complex C6-5-C6 may result in some mild canal stenosis in combination with posterior ligamentum flavum infolding. Multilevel uncinate spurring and facet hypertrophic changes are present throughout the cervical spine as well  resulting in mild-to-moderate multilevel neural foraminal narrowing most pronounced at the C3-4 level bilaterally. Upper chest: Atelectatic changes in the lung apices. Calcification of proximal great vessels noted incidentally. Other: No concerning thyroid nodules or masses. IMPRESSION: 1. Curvilinear hyperattenuation along the surface of the left precentral gyrus concerning for a small amount of acute subarachnoid hemorrhage in the setting of trauma. No other acute intracranial abnormalities. 2. Left frontal scalp and supraorbital swelling and soft tissue hematoma. No subjacent calvarial or visible facial bone fracture. No orbital injury is identified. 3. No acute fracture or traumatic listhesis of the cervical spine. 4. 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 Additional regions of encephalomalacia in the bilateral occipital lobes and cerebellar  hemispheres. 5. Multilevel cervical spondylitic changes, as above. 6. Moderate arthrosis at the atlantodental and basion dens intervals with erosions and calcific pannus formation, nonspecific but can be seen with CPPD or rheumatoid arthropathy. 7. Intracranial and cervical atherosclerosis. Critical Value/emergent results were called by telephone at the time of interpretation on 03/30/2020 at 2:24 am to provider Veryl Speak , who verbally acknowledged these results. Electronically Signed   By: Lovena Le M.D.   On: 03/30/2020 02:23   CT Lumbar Spine Wo Contrast  Result Date: 03/30/2020 CLINICAL DATA:  Trauma and low back pain.  Fall.  Fever. EXAM: CT LUMBAR SPINE WITHOUT CONTRAST TECHNIQUE: Multidetector CT imaging of the lumbar spine was performed without intravenous contrast administration. Multiplanar CT image reconstructions were also generated. COMPARISON:  None. FINDINGS: Segmentation: Standard Alignment: Normal Vertebrae: No acute fracture or focal pathologic process. Paraspinal and other soft tissues: Calcific aortic atherosclerosis. Cholelithiasis. Disc levels: Multilevel degenerative disc disease without high-grade spinal canal or neural foraminal stenosis. Degenerative changes are greatest at L4-5 and L5-S1. IMPRESSION: 1. No acute fracture or static subluxation of the lumbar spine. 2. Aortic Atherosclerosis (ICD10-I70.0). Electronically Signed   By: Ulyses Jarred M.D.   On: 03/30/2020 02:13   DG CHEST PORT 1 VIEW  Result Date: 04/03/2020 CLINICAL DATA:  Fall 03/29/2020. EXAM: PORTABLE CHEST 1 VIEW COMPARISON:  03/30/2020 FINDINGS: Interval development of small left effusion and left lower lobe atelectasis. No displaced rib fracture identified on the left. Heart size upper normal.  Negative for heart failure or edema. IMPRESSION: Small left effusion and left lower lobe airspace disease. No displaced rib fracture identified on the left. Electronically Signed   By: Franchot Gallo M.D.   On:  04/03/2020 11:29   ECHOCARDIOGRAM COMPLETE  Result Date: 03/31/2020    ECHOCARDIOGRAM REPORT   Patient Name:   Amy Wiggins Date of Exam: 03/31/2020 Medical Rec #:  [id number]         Height:       64.0 in Accession #:    [id number]        Weight:       183.2 lb Date of Birth:  [birth year]        BSA:          1.885 m Patient Age:    10 years          BP:           118/47 mmHg Patient Gender: F                 HR:           87 bpm. Exam Location:  [facility] Procedure: 2D Echo, Cardiac Doppler and Color Doppler Indications:    Bacteremia R78.81  History:  Patient has prior history of Echocardiogram examinations, most                 recent 07/28/2019. Stroke, Arrythmias:Atrial Fibrillation; Risk                 Factors:Hypertension, Diabetes and Dyslipidemia. CKD (chronic                 kidney disease) stage 4, GFR 15-29 ml/min.  Sonographer:    Alvino Chapel RCS Referring Phys: [place]  1. Left ventricular ejection fraction, by estimation, is 65 to 70%. The left ventricle has normal function. The left ventricle has no regional wall motion abnormalities. There is moderate left ventricular hypertrophy. Left ventricular diastolic parameters are indeterminate.  2. Right ventricular systolic function is normal. The right ventricular size is normal.  3. Left atrial size was severely dilated.  4. Right atrial size was mildly dilated.  5. The mitral valve is abnormal. Mild mitral valve regurgitation. Severe mitral stenosis. Severe mitral annular calcification.  6. The aortic valve has an indeterminant number of cusps. There is severe calcifcation of the aortic valve. There is severe thickening of the aortic valve. Aortic valve regurgitation is not visualized. Mild to moderate aortic valve stenosis.  7. The inferior vena cava is normal in size with greater than 50% respiratory variability, suggesting right atrial pressure of 3 mmHg. FINDINGS  Left Ventricle: Left ventricular ejection  fraction, by estimation, is 65 to 70%. The left ventricle has normal function. The left ventricle has no regional wall motion abnormalities. The left ventricular internal cavity size was normal in size. There is  moderate left ventricular hypertrophy. Left ventricular diastolic parameters are indeterminate. Right Ventricle: The right ventricular size is normal. No increase in right ventricular wall thickness. Right ventricular systolic function is normal. Left Atrium: Left atrial size was severely dilated. Right Atrium: Right atrial size was mildly dilated. Pericardium: There is no evidence of pericardial effusion. Mitral Valve: The mitral valve is abnormal. Severe mitral annular calcification. Mild mitral valve regurgitation. Severe mitral valve stenosis. MV peak gradient, 39.7 mmHg. The mean mitral valve gradient is 13.0 mmHg. Tricuspid Valve: The tricuspid valve is normal in structure. Tricuspid valve regurgitation is mild . No evidence of tricuspid stenosis. Aortic Valve: The aortic valve has an indeterminant number of cusps. There is severe calcifcation of the aortic valve. There is severe thickening of the aortic valve. There is severe aortic valve annular calcification. Aortic valve regurgitation is not visualized. Mild to moderate aortic stenosis is present. Aortic valve mean gradient measures 12.0 mmHg. Aortic valve peak gradient measures 23.0 mmHg. Aortic valve area, by VTI measures 1.36 cm. Pulmonic Valve: The pulmonic valve was not well visualized. Pulmonic valve regurgitation is not visualized. No evidence of pulmonic stenosis. Aorta: The aortic root is normal in size and structure. Pulmonary Artery: Indeterminant PASP, inadequate TR jet. Venous: The inferior vena cava is normal in size with greater than 50% respiratory variability, suggesting right atrial pressure of 3 mmHg. IAS/Shunts: No atrial level shunt detected by color flow Doppler.  LEFT VENTRICLE PLAX 2D LVIDd:         4.60 cm  Diastology  LVIDs:         2.70 cm  LV e' medial:    5.22 cm/s LV PW:         1.40 cm  LV E/e' medial:  42.3 LV IVS:        1.20 cm  LV e' lateral:  9.14 cm/s LVOT diam:     1.90 cm  LV E/e' lateral: 24.2 LV SV:         67 LV SV Index:   36 LVOT Area:     2.84 cm  RIGHT VENTRICLE RV S prime:     15.00 cm/s TAPSE (M-mode): 2.9 cm LEFT ATRIUM              Index       RIGHT ATRIUM           Index LA diam:        4.10 cm  2.18 cm/m  RA Area:     18.40 cm LA Vol (A2C):   132.0 ml 70.03 ml/m RA Volume:   48.00 ml  25.47 ml/m LA Vol (A4C):   111.0 ml 58.89 ml/m LA Biplane Vol: 126.0 ml 66.85 ml/m  AORTIC VALVE AV Area (Vmax):    1.33 cm AV Area (Vmean):   1.61 cm AV Area (VTI):     1.36 cm AV Vmax:           240.00 cm/s AV Vmean:          150.000 cm/s AV VTI:            0.493 m AV Peak Grad:      23.0 mmHg AV Mean Grad:      12.0 mmHg LVOT Vmax:         113.00 cm/s LVOT Vmean:        85.100 cm/s LVOT VTI:          0.236 m LVOT/AV VTI ratio: 0.48  AORTA Ao Root diam: 3.10 cm MITRAL VALVE MV Area (PHT): 2.20 cm     SHUNTS MV Area VTI:   0.66 cm     Systemic VTI:  0.24 m MV Peak grad:  39.7 mmHg    Systemic Diam: 1.90 cm MV Mean grad:  13.0 mmHg MV Vmax:       3.15 m/s MV Vmean:      147.0 cm/s MV Decel Time: 345 msec MV E velocity: 221.00 cm/s MV A velocity: 132.00 cm/s MV E/A ratio:  1.67 Carlyle Dolly MD Electronically signed by Carlyle Dolly MD Signature Date/Time: 03/31/2020/6:14:00 PM    Final    DG Hip Unilat W or Wo Pelvis 2-3 Views Left  Result Date: 03/30/2020 CLINICAL DATA:  Fall EXAM: DG HIP (WITH OR WITHOUT PELVIS) 2-3V LEFT COMPARISON:  None. FINDINGS: There is no evidence of hip fracture or dislocation. Moderate left hip osteoarthritis is seen with superior joint space loss and marginal osteophyte formation. There is diffuse osteopenia. There is no evidence of arthropathy or other focal bone abnormality. Scattered vascular calcifications are noted. IMPRESSION: No acute osseous abnormality Electronically  Signed   By: Prudencio Pair M.D.   On: 03/30/2020 01:50   [place] EKG SITE RITE  Result Date: 04/04/2020 If Site Rite image not attached, placement could not be confirmed due to current cardiac rhythm.    Microbiology: Recent Results (from the past 240 hour(s))  SARS Coronavirus 2 by RT PCR (hospital order, performed in [facility] hospital lab) Nasopharyngeal Nasopharyngeal Swab     Status: None   Collection Time: 03/30/20 12:45 AM   Specimen: Nasopharyngeal Swab  Result Value Ref Range Status   SARS Coronavirus 2 NEGATIVE NEGATIVE Final    Comment: (NOTE) SARS-CoV-2 target nucleic acids are NOT DETECTED.  The SARS-CoV-2 RNA is generally detectable in upper and lower respiratory specimens during the acute  phase of infection. The lowest concentration of SARS-CoV-2 viral copies this assay can detect is 250 copies / mL. A negative result does not preclude SARS-CoV-2 infection and should not be used as the sole basis for treatment or other patient management decisions.  A negative result may occur with improper specimen collection / handling, submission of specimen other than nasopharyngeal swab, presence of viral mutation(s) within the areas targeted by this assay, and inadequate number of viral copies (<250 copies / mL). A negative result must be combined with clinical observations, patient history, and epidemiological information.  Fact Sheet for Patients:   StrictlyIdeas.no  Fact Sheet for Healthcare Providers: BankingDealers.co.za  This test is not yet approved or  cleared by the [place] FDA and has been authorized for detection and/or diagnosis of SARS-CoV-2 by FDA under an Emergency Use Authorization (EUA).  This EUA will remain in effect (meaning this test can be used) for the duration of the COVID-19 declaration under Section 564(b)(1) of the Act, 21 U.S.C. section 360bbb-3(b)(1), unless the authorization is terminated  or revoked sooner.  Performed at [facility], [address] [place], [place] [address]   Culture, blood (routine x 2)     Status: Abnormal   Collection Time: 03/30/20 12:45 AM   Specimen: BLOOD  Result Value Ref Range Status   Specimen Description   Final    BLOOD Performed at [facility], [address] [place], [place] [address]    Special Requests   Final    Normal Performed at [facility], [address] [place], [place] [address]    Culture  Setup Time   Final    GRAM POSITIVE COCCI IN BOTH AEROBIC AND ANAEROBIC BOTTLES Gram Stain Report Called to,Read Back By and Verified With: WHITE,M RN @1510  03/30/20 BY JONES,T Performed at [place] ID to follow CRITICAL RESULT CALLED TO, READ BACK BY AND VERIFIED WITH: Gavin Pound RN 2134 03/30/20 A BROWNING Performed at [facility], [place] [address] [place], [place] [address]    Culture ENTEROCOCCUS FAECALIS (A)  Final   Report Status 04/01/2020 FINAL  Final   Organism ID, Bacteria ENTEROCOCCUS FAECALIS  Final      Susceptibility   Enterococcus faecalis - MIC*    AMPICILLIN <=2 SENSITIVE Sensitive     VANCOMYCIN 1 SENSITIVE Sensitive     GENTAMICIN SYNERGY SENSITIVE Sensitive     * ENTEROCOCCUS FAECALIS  Blood Culture ID Panel (Reflexed)     Status: Abnormal   Collection Time: 03/30/20 12:45 AM  Result Value Ref Range Status   Enterococcus faecalis DETECTED (A) NOT DETECTED Final    Comment: CRITICAL RESULT CALLED TO, READ BACK BY AND VERIFIED WITH: A LEONARD RN 2134 03/30/20 A BROWNING    Enterococcus Faecium NOT DETECTED NOT DETECTED Final   Listeria monocytogenes NOT DETECTED NOT DETECTED Final   Staphylococcus species NOT DETECTED NOT DETECTED Final   Staphylococcus aureus (BCID) NOT DETECTED NOT DETECTED Final   Staphylococcus epidermidis NOT DETECTED NOT DETECTED Final   Staphylococcus lugdunensis NOT DETECTED NOT DETECTED Final   Streptococcus species NOT DETECTED NOT DETECTED Final    Streptococcus agalactiae NOT DETECTED NOT DETECTED Final   Streptococcus pneumoniae NOT DETECTED NOT DETECTED Final   Streptococcus pyogenes NOT DETECTED NOT DETECTED Final   A.calcoaceticus-baumannii NOT DETECTED NOT DETECTED Final   Bacteroides fragilis NOT DETECTED NOT DETECTED Final   Enterobacterales NOT DETECTED NOT DETECTED Final   Enterobacter cloacae complex NOT DETECTED NOT DETECTED Final  Escherichia coli NOT DETECTED NOT DETECTED Final   Klebsiella aerogenes NOT DETECTED NOT DETECTED Final   Klebsiella oxytoca NOT DETECTED NOT DETECTED Final   Klebsiella pneumoniae NOT DETECTED NOT DETECTED Final   Proteus species NOT DETECTED NOT DETECTED Final   Salmonella species NOT DETECTED NOT DETECTED Final   Serratia marcescens NOT DETECTED NOT DETECTED Final   Haemophilus influenzae NOT DETECTED NOT DETECTED Final   Neisseria meningitidis NOT DETECTED NOT DETECTED Final   Pseudomonas aeruginosa NOT DETECTED NOT DETECTED Final   Stenotrophomonas maltophilia NOT DETECTED NOT DETECTED Final   Candida albicans NOT DETECTED NOT DETECTED Final   Candida auris NOT DETECTED NOT DETECTED Final   Candida glabrata NOT DETECTED NOT DETECTED Final   Candida krusei NOT DETECTED NOT DETECTED Final   Candida parapsilosis NOT DETECTED NOT DETECTED Final   Candida tropicalis NOT DETECTED NOT DETECTED Final   Cryptococcus neoformans/gattii NOT DETECTED NOT DETECTED Final   Vancomycin resistance NOT DETECTED NOT DETECTED Final    Comment: Performed at [facility], [place] [address], [place], [place] [address]  Culture, blood (routine x 2)     Status: Abnormal   Collection Time: 03/30/20  1:02 AM   Specimen: BLOOD  Result Value Ref Range Status   Specimen Description   Final    BLOOD LEFT ANTECUBITAL Performed at [facility], [address] [place], [place] [address]    Special Requests   Final    BOTTLES DRAWN AEROBIC AND ANAEROBIC Blood Culture adequate volume Performed at [facility], [address] [place], [place] [address]    Culture  Setup Time   Final    GRAM POSITIVE COCCI IN BOTH AEROBIC AND ANAEROBIC BOTTLES Gram Stain Report Called to,Read Back By and Verified With: WHITE,M RN @1510  03/30/20 BY JONES,T Performed at [place] TO, READ BACK BY AND VERIFIED WITH: A LEONARD RN 2134 03/30/20 A BROWNING    Culture (A)  Final    ENTEROCOCCUS FAECALIS SUSCEPTIBILITIES PERFORMED ON PREVIOUS CULTURE WITHIN THE LAST 5 DAYS. Performed at [facility], [place] [address] [place], [place] [address]    Report Status 04/01/2020 FINAL  Final  Urine culture     Status: None   Collection Time: 03/30/20  4:50 AM   Specimen: Urine, Catheterized  Result Value Ref Range Status   Specimen Description   Final    URINE, CATHETERIZED Performed at [facility], [address] [place], [place] [address]    Special Requests   Final    Normal Performed at [facility], [address] [place], [place] [address]    Culture   Final    NO GROWTH Performed at [facility], [place] [address], [place], [place] [address]    Report Status 03/31/2020 FINAL  Final  Culture, blood (Routine X 2) w Reflex to ID Panel     Status: None   Collection Time: 03/31/20  8:52 AM   Specimen: BLOOD  Result Value Ref Range Status   Specimen Description BLOOD LEFT ANTECUBITAL  Final   Special Requests   Final    BOTTLES DRAWN AEROBIC AND ANAEROBIC Blood Culture adequate volume   Culture   Final    NO GROWTH 5 DAYS Performed at [facility], [address], [place], [place] [address]    Report Status 04/05/2020 FINAL  Final  Culture, blood (routine x 2)     Status: None   Collection Time: 04/01/20  4:51 PM   Specimen: BLOOD  Result Value Ref Range Status   Specimen Description BLOOD RIGHT ANTECUBITAL  Final   Special Requests   Final    BOTTLES DRAWN AEROBIC AND ANAEROBIC Blood Culture adequate volume   Culture   Final    NO GROWTH  5 DAYS Performed at [facility], [address], [place], [place] [address]    Report Status 04/06/2020 FINAL  Final  Culture, blood (routine x 2)     Status: None   Collection Time: 04/01/20  4:59 PM   Specimen: BLOOD RIGHT HAND  Result Value Ref Range Status   Specimen Description BLOOD RIGHT HAND  Final   Special Requests   Final    BOTTLES DRAWN AEROBIC AND ANAEROBIC Blood Culture adequate volume   Culture   Final    NO GROWTH 5 DAYS Performed at [facility], [address] [place], [place] [address]    Report Status 04/06/2020 FINAL  Final  MRSA PCR Screening     Status: Abnormal   Collection Time: 04/06/20  9:36 AM   Specimen: Nasal Mucosa; Nasopharyngeal  Result Value Ref Range Status   MRSA by PCR POSITIVE (A) NEGATIVE Final    Comment:        The GeneXpert MRSA Assay (FDA approved for NASAL specimens only), is one component of a comprehensive MRSA colonization surveillance program. It is not intended to diagnose MRSA infection nor to guide or monitor treatment for MRSA infections. RESULT CALLED TO, READ BACK BY AND VERIFIED WITH: WRIGHT,E RN @1221  04/06/20 BY JONES,T Performed at [facility], [address] [place], [place] [address]      Labs: Basic Metabolic Panel: Recent Labs  Lab 04/03/20 [phone] 04/04/20 0734 04/05/20 0702 04/06/20 0703 04/07/20 0634  NA 152* 142 143 139 140  K 3.0* 3.8 4.2 4.2 4.4  CL 115* 110 109 109 105  CO2 23 26 25 24 24   GLUCOSE 170* 209* 161* 198* 85  BUN 57* 65* 64* 62* 63*  CREATININE 2.33* 3.07* 3.00* 3.07* 2.85*  CALCIUM 7.4* 9.1 9.5 9.6 9.8  MG  --  2.7*  --  2.5*  --    Liver Function Tests: Recent Labs  Lab 04/02/20 0609 04/03/20 0632 04/04/20 0734 04/05/20 0702 04/06/20 0703  AST 48* 41 54* 49* 45*  ALT 41 33 44 43 44  ALKPHOS 128* 99 160* 174* 179*  BILITOT 0.9 0.7 0.7 0.6 0.7  PROT 6.0* <3.0* 5.9* 6.1* 6.1*  ALBUMIN 2.3* 1.7* 2.2* 2.2* 2.1*   No results for input(s): LIPASE, AMYLASE in the last 168  hours. No results for input(s): AMMONIA in the last 168 hours. CBC: Recent Labs  Lab 04/01/20 0926 04/02/20 0609 04/03/20 0632 04/04/20 0734 04/05/20 0702  WBC 16.2* 16.9* 16.5* 16.0* 15.6*  NEUTROABS 13.1* 14.6* 14.6* 13.4* 13.5*  HGB 8.0* 8.4* 7.2* 8.1* 8.2*  HCT 27.3* 28.9* 25.8* 30.0* 31.3*  MCV 92.9 93.5 96.3 100.3* 101.3*  PLT 233 277 252 274 236   Cardiac Enzymes: No results for input(s): CKTOTAL, CKMB, CKMBINDEX, TROPONINI in the last 168 hours. BNP: Invalid input(s): POCBNP CBG: Recent Labs  Lab 04/06/20 1151 04/06/20 1600 04/06/20 2108 04/07/20 0732 04/07/20 1110  GLUCAP 240* 134* 264* 78 170*    Time coordinating discharge:  36 minutes  Signed:  Orson Eva, DO [facility] Pager: [phone] 04/07/2020, 11:52 AM

## 2020-04-07 NOTE — TOC Transition Note (Signed)
Transition of Care [facility]) - CM/SW Discharge Note   Patient Details  Name: Amy Wiggins MRN: [id number] Date of Birth: [birth year]  Transition of Care [facility]) CM/SW Contact:  Boneta Lucks, RN Phone Number: 04/07/2020, 1:09 PM   Clinical Narrative:   Patient medically ready for discharge. Mardene Celeste at [facility] received [employer]. TOC updated granddaughter Caren Griffins. Clinicals sent in the hub, medical necessity printed, RN to call report. TOC will schedule EMS when RN is ready.   Final next level of care: Skilled Nursing Facility Barriers to Discharge: Barriers Resolved   Patient Goals and CMS Choice Patient states their goals for this hospitalization and ongoing recovery are:: to go to SNF. CMS Medicare.gov Compare Post Acute Care list provided to:: Patient Represenative (must comment) Choice offered to / list presented to : Adult Children  Discharge Placement              Patient chooses bed at:  [facility]) Patient to be transferred to facility by: EMS Name of family member notified: [place] - grand daughter Patient and family notified of of transfer: 04/07/20  Discharge Plan and Services In-house Referral: Clinical Social Work   Post Acute Care Choice: Home Health               HH Arranged: RN,PT [facility] Agency: [place] Date [facility] Agency Contacted: 03/30/20   Representative spoke with at [place]: [facility]  Readmission Risk Interventions Readmission Risk Prevention Plan 04/06/2020 09/10/2019  Transportation Screening Complete Complete  PCP or Specialist Appt within 3-5 Days - Not Complete  Not Complete comments - Granddaughter assists patient with scheduling appointments  [place] or [place] - Complete  Social Work Consult for Recovery Care Planning/Counseling - Complete  Palliative Care Screening - Not Applicable  Medication Review Press [employer]) Complete Complete  HRI or Home Care Consult Complete -  SW Recovery Care/Counseling Consult Complete -   Palliative Care Screening Not Applicable -  Skilled Nursing Facility Complete -  Some recent data might be hidden

## 2020-04-09 DIAGNOSIS — A4189 Other specified sepsis: Secondary | ICD-10-CM | POA: Diagnosis not present

## 2020-04-09 DIAGNOSIS — R531 Weakness: Secondary | ICD-10-CM | POA: Diagnosis not present

## 2020-04-11 ENCOUNTER — Other Ambulatory Visit: Payer: Self-pay

## 2020-04-11 NOTE — Patient Outreach (Signed)
[facility]) Care Management  04/11/2020  Amy Wiggins [birth year] [id number]     Transition of Care Referral  Referral Date: 04/11/2020 Referral Source: [facility] Discharge Report Date of Discharge: 2020/04/17 Facility: [place]: [facility]    Referral received and per Patient Amy Wiggins platform patient remains at facility and active with [facility].     Plan: RN CM will close referral at this time.    Enzo [place], RN,BSN,CCM [place] Management Telephonic Care Management Coordinator Direct Phone: [phone] Toll Free: [phone] Fax: [phone]

## 2020-04-11 NOTE — Progress Notes (Deleted)
PCP:  Asencion Noble, MD Primary Cardiologist: Virl Axe, MD Electrophysiologist: Virl Axe, MD   Amy Wiggins is a 87 y.o. female seen today for Virl Axe, MD for routine electrophysiology followup.  Since last being seen in our clinic the patient reports doing ***.  she denies chest pain, palpitations, dyspnea, PND, orthopnea, nausea, vomiting, dizziness, syncope, edema, weight gain, or early satiety.  Past Medical History:  Diagnosis Date  . Chronic anticoagulation   . Depression   . Diabetes mellitus   . Hyperlipidemia   . Hypertension   . PAF (paroxysmal atrial fibrillation) ([place])   . PFO (patent foramen ovale)   . Stroke ([place])    TIA's x 2   Past Surgical History:  Procedure Laterality Date  . COLONOSCOPY    . COLONOSCOPY N/A 09/09/2014   Procedure: COLONOSCOPY;  Surgeon: Rogene Houston, MD;  Location: [facility];  Service: Endoscopy;  Laterality: N/A;  1010 - moved to 7:30 - Ann to notify  . EP IMPLANTABLE DEVICE N/A 06/07/2015   Procedure: Loop Recorder Insertion;  Surgeon: Deboraha Sprang, MD;  Location: [place] CV LAB;  Service: Cardiovascular;  Laterality: N/A;  . TEE WITHOUT CARDIOVERSION N/A 06/07/2015   Procedure: TRANSESOPHAGEAL ECHOCARDIOGRAM (TEE);  Surgeon: Thayer Headings, MD;  Location: [facility] ENDOSCOPY;  Service: Cardiovascular;  Laterality: N/A;    Current Outpatient Medications  Medication Sig Dispense Refill  . amiodarone (PACERONE) 200 MG tablet Take 1 tablet (200 mg total) by mouth daily. 30 tablet 0  . ampicillin 2 g in sodium chloride 0.9 % 100 mL Inject 2 g into the vein every 8 (eight) hours. Stop date 04/29/20    . atorvastatin (LIPITOR) 20 MG tablet Take 20 mg by mouth daily.    Marland Kitchen buPROPion (WELLBUTRIN XL) 300 MG 24 hr tablet Take 1 tablet (300 mg total) by mouth daily. 30 tablet 0  . cefTRIAXone 2 g in sodium chloride 0.9 % 100 mL Inject 2 g into the vein every 12 (twelve) hours. Stop date 04/29/20    . citalopram (CELEXA) 20 MG tablet  Take 1 tablet (20 mg total) by mouth daily. 30 tablet 0  . diltiazem (CARDIZEM CD) 120 MG 24 hr capsule Take 1 capsule (120 mg total) by mouth daily. 30 capsule 0  . DROPLET PEN NEEDLES 32G X 4 MM MISC     . insulin aspart (NOVOLOG FLEXPEN) 100 UNIT/ML FlexPen Inject 10 Units into the skin 3 (three) times daily with meals. insulin pen; 100 unit/mL (3 mL); amt: 10 units; subcutaneous Special Instructions: admin 5 units for CBG >150 Before Meals 08:00 AM, 11:30 AM, 05:00 PM 15 mL 0  . insulin detemir (LEVEMIR) 100 UNIT/ML injection Inject 0.1 mLs (10 Units total) into the skin at bedtime. 10 mL 0  . ipratropium-albuterol (DUONEB) 0.5-2.5 (3) MG/3ML SOLN Take 3 mLs by nebulization every 6 (six) hours. 360 mL 0  . Lancet Devices (TRUEDRAW LANCING DEVICE) MISC     . mirtazapine (REMERON) 15 MG tablet Take 15 mg by mouth at bedtime.    . Multiple Vitamin (MULTIVITAMIN WITH MINERALS) TABS tablet Take 1 tablet by mouth at bedtime. For supplement    . Multiple Vitamins-Minerals (PRESERVISION AREDS 2) CAPS Take 1 capsule by mouth 2 (two) times daily.    . OXYGEN Inhale 2 L into the lungs continuous.    . pantoprazole (PROTONIX) 40 MG tablet Take 1 tablet (40 mg total) by mouth daily. For acid reflux/gerd 30 tablet 0  .  torsemide (DEMADEX) 20 MG tablet Take 1 tablet (20 mg total) by mouth daily. Take 40mg  in am and 20mg  in the evening. You may take an extra tablet as needed 180 tablet 3  . TRUEplus Lancets 33G MISC      No current facility-administered medications for this visit.    No Known Allergies  Social History   Socioeconomic History  . Marital status: Widowed    Spouse name: Not on file  . Number of children: 2  . Years of education: Not on file  . Highest education level: Not on file  Occupational History  . Occupation: retired  Tobacco Use  . Smoking status: Former Smoker    Years: 10.00  . Smokeless tobacco: Never Used  . Tobacco comment: was an occasional smoker in the past   Vaping Use  . Vaping Use: Never used  Substance and Sexual Activity  . Alcohol use: No  . Drug use: No  . Sexual activity: Never  Other Topics Concern  . Not on file  Social History Narrative  . Not on file   Social Determinants of Health   Financial Resource Strain: Not on file  Food Insecurity: Not on file  Transportation Needs: No Transportation Needs  . Lack of Transportation (Medical): No  . Lack of Transportation (Non-Medical): No  Physical Activity: Not on file  Stress: Not on file  Social Connections: Not on file  Intimate Partner Violence: Not on file     Review of Systems: General: No chills, fever, night sweats or weight changes  Cardiovascular:  No chest pain, dyspnea on exertion, edema, orthopnea, palpitations, paroxysmal nocturnal dyspnea Dermatological: No rash, lesions or masses Respiratory: No cough, dyspnea Urologic: No hematuria, dysuria Abdominal: No nausea, vomiting, diarrhea, bright red blood per rectum, melena, or hematemesis Neurologic: No visual changes, weakness, changes in mental status All other systems reviewed and are otherwise negative except as noted above.  Physical Exam: There were no vitals filed for this visit.  GEN- The patient is well appearing, alert and oriented x 3 today.   HEENT: normocephalic, atraumatic; sclera clear, conjunctiva pink; hearing intact; oropharynx clear; neck supple, no JVP Lymph- no cervical lymphadenopathy Lungs- Clear to ausculation bilaterally, normal work of breathing.  No wheezes, rales, rhonchi Heart- Regular rate and rhythm, no murmurs, rubs or gallops, PMI not laterally displaced GI- soft, non-tender, non-distended, bowel sounds present, no hepatosplenomegaly Extremities- no clubbing, cyanosis, or edema; DP/PT/radial pulses 2+ bilaterally MS- no significant deformity or atrophy Skin- warm and dry, no rash or lesion Psych- euthymic mood, full affect Neuro- strength and sensation are intact  EKG is  not ordered.   Additional studies reviewed include: Previous EP office notes  Assessment and Plan:  1. Paroxysmal Afib She has a CHA2DS2Vasc is 7 and is on Eliquis 2.5 mg BID (Age/Creatinine) ILR is EOS, she is not interested in having it removed Regular on exam today.  Continue amiodarone and diltiazem.  TSH elevated 8/12. Amio surveillance labs today with CHF exacerbation.   2. HTN Continue current medications.  Previously taken off losartan with hypotension. Continue to avoid ACE/ARB with CKD.   3. HFpEF Volume status *** NYHA *** symptoms.  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 She has been seen by Nephrology and will not pursue HD.  Continue torsemide 40 mg q am and 20 mg q pm.   4. Fatigue, chronic component Stable.  5. Murmur TTE <1 year ago reviewed Echo 07/28/2019 LVEF 60-65%, Moderate LVH, Grade 2 DD, severe LAE,  Moderateto severe MS by mean gradientof 10 mmHg. Not surgical candidate with CKD IV-V and no plans to pursue HD.  Palliative care has been involved.   6. CKD IV Cr 2.8 - 3.0 Creatinine has gradually trended up over the past year.  Follows with Nephrology. She has decided not to pursue HD  7. Goals of Care Discussed at length in office. She has a DNR at home. She understands that her prognosis is relatively poor due to her degree of kidney disease. Balancing her oral intake, diuresis, and symptoms will be difficult.   Shirley Friar, PA-C  04/11/20 10:13 AM

## 2020-04-12 ENCOUNTER — Ambulatory Visit: Payer: Medicare HMO | Admitting: Student

## 2020-04-12 DIAGNOSIS — I5032 Chronic diastolic (congestive) heart failure: Secondary | ICD-10-CM | POA: Diagnosis not present

## 2020-04-12 DIAGNOSIS — N184 Chronic kidney disease, stage 4 (severe): Secondary | ICD-10-CM | POA: Diagnosis not present

## 2020-04-12 DIAGNOSIS — I639 Cerebral infarction, unspecified: Secondary | ICD-10-CM | POA: Diagnosis not present

## 2020-04-12 DIAGNOSIS — J9601 Acute respiratory failure with hypoxia: Secondary | ICD-10-CM | POA: Diagnosis not present

## 2020-04-25 DEATH — deceased

## 2020-07-10 IMAGING — CR PORTABLE CHEST - 1 VIEW
1 series · 1 of 1 positions shown · non-contrast
Comparison: Portable exam 0966 hours compared to 09/28/2018

CLINICAL DATA: Weakness, hypertension, diabetes mellitus, atrial
fibrillation, history stroke

EXAM:
PORTABLE CHEST 1 VIEW

[portable]
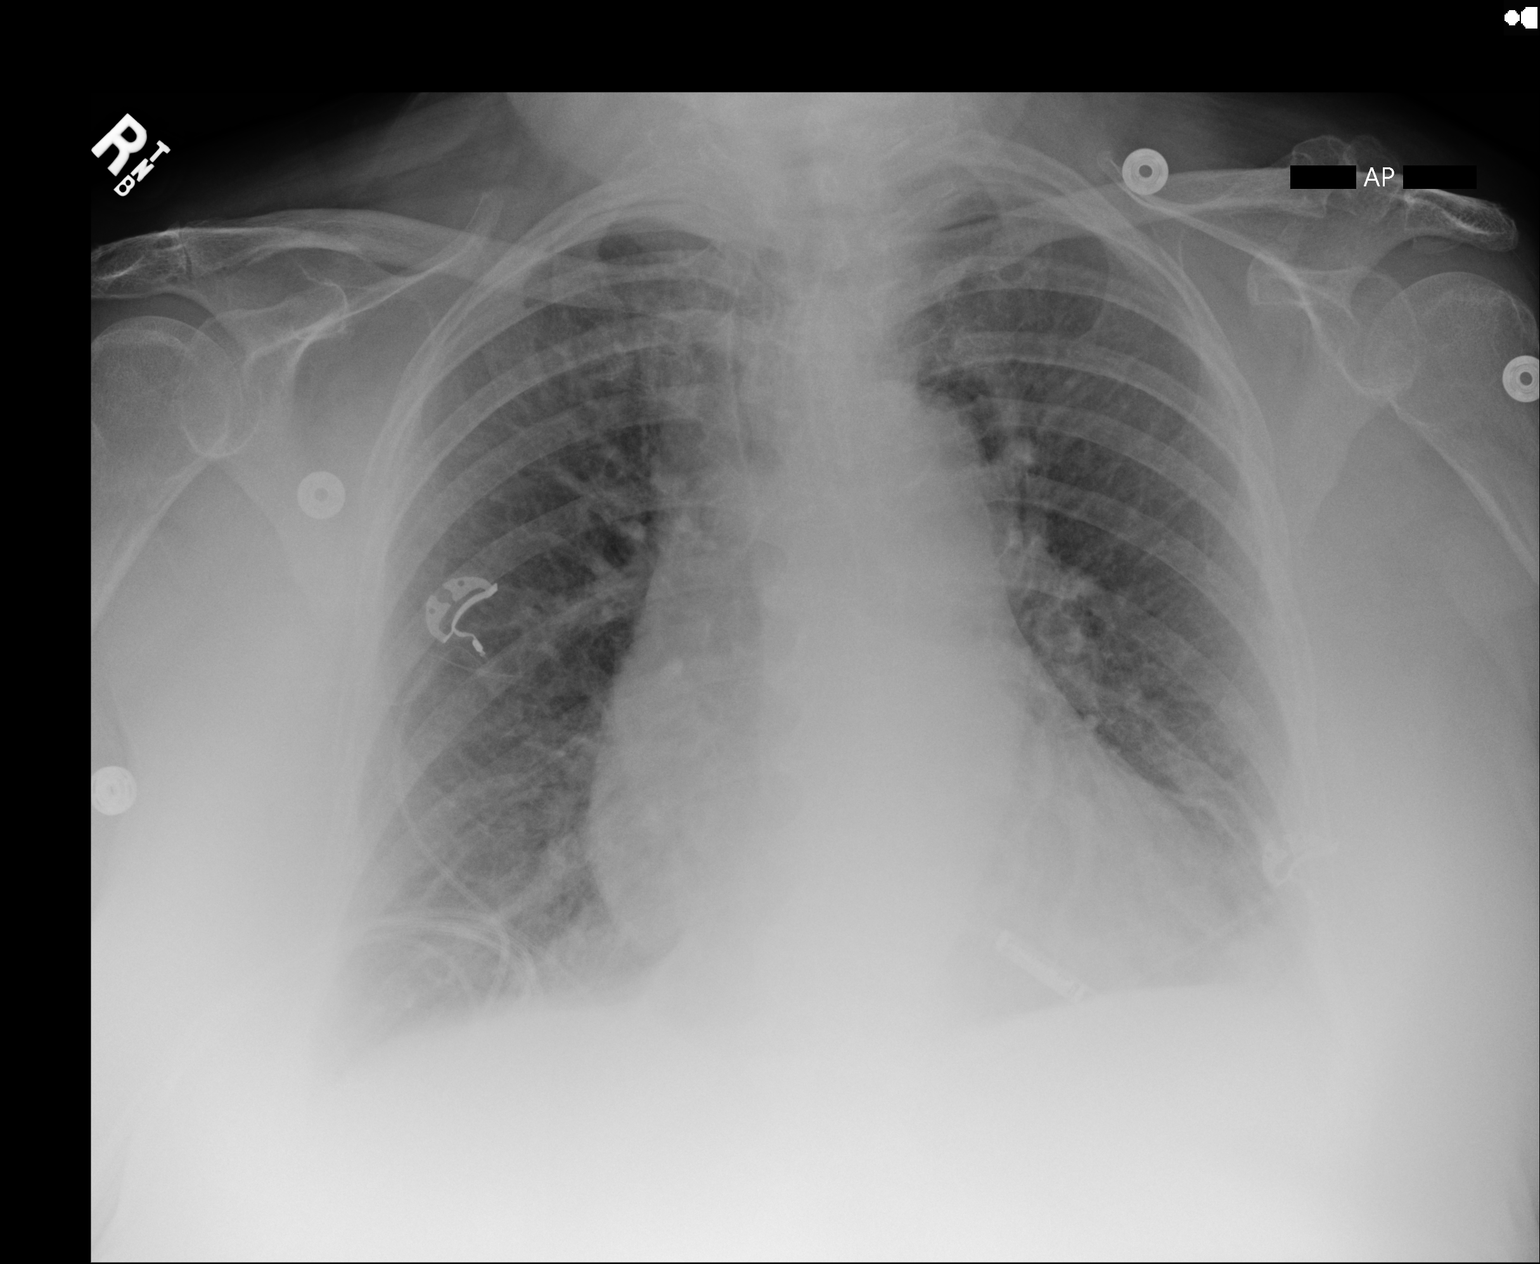

[1 of 1 positions shown; findings below may reference images not displayed]

FINDINGS: Enlargement of cardiac silhouette with pulmonary vascular
congestion.

Atherosclerotic calcification aorta.

Loop recorder projects over lower medial LEFT chest.

No definite acute infiltrate or pulmonary edema.

No pleural effusion or pneumothorax.

Bones demineralized.
IMPRESSION: Enlargement of cardiac silhouette with pulmonary vascular
congestion.

No acute abnormalities.
